# Patient Record
Sex: Male | Born: 1935 | Race: White | Hispanic: No | Smoking: Never smoker
Health system: Southern US, Community
[De-identification: ages and names within clinical notes are randomized; demographics above are authoritative.]

## PROBLEM LIST (undated history)

## (undated) DIAGNOSIS — E669 Obesity, unspecified: Secondary | ICD-10-CM

## (undated) DIAGNOSIS — F32A Depression, unspecified: Secondary | ICD-10-CM

## (undated) DIAGNOSIS — E119 Type 2 diabetes mellitus without complications: Secondary | ICD-10-CM

## (undated) DIAGNOSIS — Z860101 Personal history of adenomatous and serrated colon polyps: Secondary | ICD-10-CM

## (undated) DIAGNOSIS — I2510 Atherosclerotic heart disease of native coronary artery without angina pectoris: Secondary | ICD-10-CM

## (undated) DIAGNOSIS — N179 Acute kidney failure, unspecified: Secondary | ICD-10-CM

## (undated) DIAGNOSIS — N529 Male erectile dysfunction, unspecified: Secondary | ICD-10-CM

## (undated) DIAGNOSIS — M1990 Unspecified osteoarthritis, unspecified site: Secondary | ICD-10-CM

## (undated) DIAGNOSIS — I10 Essential (primary) hypertension: Secondary | ICD-10-CM

## (undated) DIAGNOSIS — E785 Hyperlipidemia, unspecified: Secondary | ICD-10-CM

## (undated) DIAGNOSIS — Z86010 Personal history of colonic polyps: Secondary | ICD-10-CM

## (undated) DIAGNOSIS — I639 Cerebral infarction, unspecified: Secondary | ICD-10-CM

## (undated) DIAGNOSIS — F419 Anxiety disorder, unspecified: Secondary | ICD-10-CM

## (undated) HISTORY — DX: Personal history of colonic polyps: Z86.010

## (undated) HISTORY — PX: LOOP RECORDER IMPLANT: SHX5954

## (undated) HISTORY — DX: Atherosclerotic heart disease of native coronary artery without angina pectoris: I25.10

## (undated) HISTORY — DX: Essential (primary) hypertension: I10

## (undated) HISTORY — DX: Male erectile dysfunction, unspecified: N52.9

## (undated) HISTORY — DX: Acute kidney failure, unspecified: N17.9

## (undated) HISTORY — DX: Type 2 diabetes mellitus without complications: E11.9

## (undated) HISTORY — DX: Anxiety disorder, unspecified: F41.9

## (undated) HISTORY — DX: Personal history of adenomatous and serrated colon polyps: Z86.0101

## (undated) HISTORY — DX: Depression, unspecified: F32.A

## (undated) HISTORY — DX: Obesity, unspecified: E66.9

## (undated) HISTORY — PX: UMBILICAL HERNIA REPAIR: SHX196

## (undated) HISTORY — PX: COLONOSCOPY W/ BIOPSIES AND POLYPECTOMY: SHX1376

## (undated) HISTORY — DX: Hyperlipidemia, unspecified: E78.5

---

## 2000-07-24 ENCOUNTER — Emergency Department (HOSPITAL_COMMUNITY): Admission: EM | Admit: 2000-07-24 | Discharge: 2000-07-24 | Payer: Self-pay | Admitting: Emergency Medicine

## 2000-08-15 ENCOUNTER — Encounter: Admission: RE | Admit: 2000-08-15 | Discharge: 2000-08-15 | Payer: Self-pay | Admitting: Internal Medicine

## 2000-08-15 ENCOUNTER — Encounter: Payer: Self-pay | Admitting: Internal Medicine

## 2000-08-17 ENCOUNTER — Encounter (INDEPENDENT_AMBULATORY_CARE_PROVIDER_SITE_OTHER): Payer: Self-pay | Admitting: *Deleted

## 2000-08-17 ENCOUNTER — Inpatient Hospital Stay (HOSPITAL_COMMUNITY): Admission: AD | Admit: 2000-08-17 | Discharge: 2000-08-21 | Payer: Self-pay | Admitting: *Deleted

## 2000-08-17 ENCOUNTER — Encounter: Payer: Self-pay | Admitting: Nephrology

## 2000-08-18 ENCOUNTER — Encounter: Payer: Self-pay | Admitting: Nephrology

## 2000-09-07 ENCOUNTER — Emergency Department (HOSPITAL_COMMUNITY): Admission: EM | Admit: 2000-09-07 | Discharge: 2000-09-07 | Payer: Self-pay

## 2000-09-26 ENCOUNTER — Ambulatory Visit (HOSPITAL_COMMUNITY): Admission: RE | Admit: 2000-09-26 | Discharge: 2000-09-26 | Payer: Self-pay | Admitting: Nephrology

## 2000-10-18 ENCOUNTER — Encounter: Admission: RE | Admit: 2000-10-18 | Discharge: 2000-10-18 | Payer: Self-pay | Admitting: Nephrology

## 2000-10-18 ENCOUNTER — Encounter: Payer: Self-pay | Admitting: Nephrology

## 2002-08-29 ENCOUNTER — Encounter: Payer: Self-pay | Admitting: Nephrology

## 2002-08-29 ENCOUNTER — Ambulatory Visit (HOSPITAL_COMMUNITY): Admission: RE | Admit: 2002-08-29 | Discharge: 2002-08-29 | Payer: Self-pay | Admitting: Nephrology

## 2002-08-30 ENCOUNTER — Ambulatory Visit (HOSPITAL_COMMUNITY): Admission: RE | Admit: 2002-08-30 | Discharge: 2002-08-30 | Payer: Self-pay | Admitting: Nephrology

## 2002-08-30 ENCOUNTER — Encounter: Payer: Self-pay | Admitting: Nephrology

## 2004-08-27 ENCOUNTER — Encounter: Admission: RE | Admit: 2004-08-27 | Discharge: 2004-08-27 | Payer: Self-pay | Admitting: General Surgery

## 2004-10-25 ENCOUNTER — Ambulatory Visit (HOSPITAL_BASED_OUTPATIENT_CLINIC_OR_DEPARTMENT_OTHER): Admission: RE | Admit: 2004-10-25 | Discharge: 2004-10-25 | Payer: Self-pay | Admitting: General Surgery

## 2004-10-25 ENCOUNTER — Ambulatory Visit (HOSPITAL_COMMUNITY): Admission: RE | Admit: 2004-10-25 | Discharge: 2004-10-25 | Payer: Self-pay | Admitting: General Surgery

## 2006-04-18 DIAGNOSIS — I2510 Atherosclerotic heart disease of native coronary artery without angina pectoris: Secondary | ICD-10-CM

## 2006-04-18 HISTORY — PX: CORONARY ARTERY BYPASS GRAFT: SHX141

## 2006-04-18 HISTORY — DX: Atherosclerotic heart disease of native coronary artery without angina pectoris: I25.10

## 2006-05-07 ENCOUNTER — Inpatient Hospital Stay (HOSPITAL_COMMUNITY): Admission: EM | Admit: 2006-05-07 | Discharge: 2006-05-13 | Payer: Self-pay | Admitting: Emergency Medicine

## 2006-05-07 ENCOUNTER — Ambulatory Visit: Payer: Self-pay | Admitting: *Deleted

## 2006-05-08 ENCOUNTER — Encounter (INDEPENDENT_AMBULATORY_CARE_PROVIDER_SITE_OTHER): Payer: Self-pay | Admitting: *Deleted

## 2006-05-08 ENCOUNTER — Encounter: Payer: Self-pay | Admitting: Vascular Surgery

## 2006-05-30 ENCOUNTER — Ambulatory Visit: Payer: Self-pay | Admitting: Internal Medicine

## 2006-06-06 ENCOUNTER — Encounter: Admission: RE | Admit: 2006-06-06 | Discharge: 2006-06-06 | Payer: Self-pay | Admitting: Surgery

## 2006-06-06 ENCOUNTER — Ambulatory Visit: Payer: Self-pay | Admitting: Surgery

## 2006-06-09 ENCOUNTER — Encounter (HOSPITAL_COMMUNITY): Admission: RE | Admit: 2006-06-09 | Discharge: 2006-09-07 | Payer: Self-pay | Admitting: Internal Medicine

## 2006-07-18 ENCOUNTER — Ambulatory Visit: Payer: Self-pay | Admitting: Surgery

## 2006-09-08 ENCOUNTER — Encounter (HOSPITAL_COMMUNITY): Admission: RE | Admit: 2006-09-08 | Discharge: 2006-09-29 | Payer: Self-pay | Admitting: Internal Medicine

## 2006-10-05 ENCOUNTER — Ambulatory Visit: Payer: Self-pay | Admitting: Internal Medicine

## 2007-03-20 ENCOUNTER — Ambulatory Visit: Payer: Self-pay | Admitting: Internal Medicine

## 2007-09-20 ENCOUNTER — Ambulatory Visit: Payer: Self-pay | Admitting: Internal Medicine

## 2007-09-20 LAB — CONVERTED CEMR LAB
Albumin: 3.9 g/dL (ref 3.5–5.2)
Alkaline Phosphatase: 64 units/L (ref 39–117)
BUN: 23 mg/dL (ref 6–23)
Bilirubin, Direct: 0.2 mg/dL (ref 0.0–0.3)
Calcium: 9.4 mg/dL (ref 8.4–10.5)
Chloride: 106 meq/L (ref 96–112)
Cholesterol: 128 mg/dL (ref 0–200)
Creatinine, Ser: 1.1 mg/dL (ref 0.4–1.5)
Glucose, Bld: 152 mg/dL — ABNORMAL HIGH (ref 70–99)
HDL: 33 mg/dL — ABNORMAL LOW (ref >39.0)
Total Bilirubin: 1.1 mg/dL (ref 0.3–1.2)
Total CHOL/HDL Ratio: 3.9
VLDL: 13 mg/dL (ref 0–40)

## 2007-09-25 ENCOUNTER — Ambulatory Visit: Payer: Self-pay | Admitting: Internal Medicine

## 2008-03-24 ENCOUNTER — Ambulatory Visit: Payer: Self-pay | Admitting: Internal Medicine

## 2008-06-02 ENCOUNTER — Emergency Department (HOSPITAL_COMMUNITY): Admission: EM | Admit: 2008-06-02 | Discharge: 2008-06-02 | Payer: Self-pay | Admitting: Emergency Medicine

## 2008-06-02 ENCOUNTER — Ambulatory Visit: Payer: Self-pay | Admitting: Cardiology

## 2008-06-30 ENCOUNTER — Encounter (INDEPENDENT_AMBULATORY_CARE_PROVIDER_SITE_OTHER): Payer: Self-pay | Admitting: *Deleted

## 2008-07-01 ENCOUNTER — Ambulatory Visit: Payer: Self-pay | Admitting: Internal Medicine

## 2008-07-01 ENCOUNTER — Encounter: Payer: Self-pay | Admitting: Internal Medicine

## 2008-07-01 DIAGNOSIS — I10 Essential (primary) hypertension: Secondary | ICD-10-CM

## 2008-07-01 DIAGNOSIS — R5383 Other fatigue: Secondary | ICD-10-CM

## 2008-07-01 DIAGNOSIS — I2510 Atherosclerotic heart disease of native coronary artery without angina pectoris: Secondary | ICD-10-CM | POA: Insufficient documentation

## 2008-07-01 DIAGNOSIS — R5381 Other malaise: Secondary | ICD-10-CM

## 2008-07-01 DIAGNOSIS — E785 Hyperlipidemia, unspecified: Secondary | ICD-10-CM | POA: Insufficient documentation

## 2008-07-01 DIAGNOSIS — E1169 Type 2 diabetes mellitus with other specified complication: Secondary | ICD-10-CM | POA: Insufficient documentation

## 2008-07-01 DIAGNOSIS — R609 Edema, unspecified: Secondary | ICD-10-CM | POA: Insufficient documentation

## 2008-07-14 ENCOUNTER — Ambulatory Visit: Payer: Self-pay

## 2008-07-14 ENCOUNTER — Encounter: Payer: Self-pay | Admitting: Internal Medicine

## 2008-07-14 ENCOUNTER — Ambulatory Visit: Payer: Self-pay | Admitting: Internal Medicine

## 2008-07-16 LAB — CONVERTED CEMR LAB
Creatinine, Ser: 1.1 mg/dL (ref 0.4–1.5)
Sodium: 141 meq/L (ref 135–145)

## 2008-08-27 ENCOUNTER — Encounter: Payer: Self-pay | Admitting: Internal Medicine

## 2008-09-12 ENCOUNTER — Encounter (INDEPENDENT_AMBULATORY_CARE_PROVIDER_SITE_OTHER): Payer: Self-pay | Admitting: *Deleted

## 2008-10-16 ENCOUNTER — Telehealth: Payer: Self-pay | Admitting: Internal Medicine

## 2008-10-30 ENCOUNTER — Ambulatory Visit: Payer: Self-pay | Admitting: Pulmonary Disease

## 2008-10-30 DIAGNOSIS — G4733 Obstructive sleep apnea (adult) (pediatric): Secondary | ICD-10-CM

## 2008-12-01 ENCOUNTER — Ambulatory Visit: Payer: Self-pay | Admitting: Pulmonary Disease

## 2008-12-01 ENCOUNTER — Ambulatory Visit (HOSPITAL_BASED_OUTPATIENT_CLINIC_OR_DEPARTMENT_OTHER): Admission: RE | Admit: 2008-12-01 | Discharge: 2008-12-01 | Payer: Self-pay | Admitting: Pulmonary Disease

## 2009-01-28 ENCOUNTER — Ambulatory Visit: Payer: Self-pay | Admitting: Internal Medicine

## 2009-02-03 ENCOUNTER — Ambulatory Visit: Payer: Self-pay | Admitting: Internal Medicine

## 2009-02-03 ENCOUNTER — Telehealth: Payer: Self-pay | Admitting: Internal Medicine

## 2009-02-04 LAB — CONVERTED CEMR LAB
BUN: 25 mg/dL — ABNORMAL HIGH (ref 6–23)
Calcium: 9.8 mg/dL (ref 8.4–10.5)
GFR calc non Af Amer: 77.92 mL/min (ref >60)
Glucose, Bld: 179 mg/dL — ABNORMAL HIGH (ref 70–99)
Sodium: 141 meq/L (ref 135–145)

## 2009-03-26 ENCOUNTER — Telehealth: Payer: Self-pay | Admitting: Internal Medicine

## 2009-03-30 ENCOUNTER — Ambulatory Visit: Payer: Self-pay | Admitting: Internal Medicine

## 2009-03-31 LAB — CONVERTED CEMR LAB
CO2: 31 meq/L (ref 19–32)
Chloride: 104 meq/L (ref 96–112)
Creatinine, Ser: 1.1 mg/dL (ref 0.4–1.5)
GFR calc non Af Amer: 69.78 mL/min (ref >60)
Potassium: 5 meq/L (ref 3.5–5.1)
Sodium: 140 meq/L (ref 135–145)

## 2009-09-02 ENCOUNTER — Ambulatory Visit: Payer: Self-pay | Admitting: Internal Medicine

## 2009-09-28 ENCOUNTER — Telehealth: Payer: Self-pay | Admitting: Internal Medicine

## 2009-10-11 ENCOUNTER — Emergency Department (HOSPITAL_COMMUNITY): Admission: EM | Admit: 2009-10-11 | Discharge: 2009-10-11 | Payer: Self-pay | Admitting: Emergency Medicine

## 2009-11-18 ENCOUNTER — Ambulatory Visit: Payer: Self-pay

## 2009-11-18 ENCOUNTER — Ambulatory Visit: Payer: Self-pay | Admitting: Internal Medicine

## 2010-05-20 NOTE — Assessment & Plan Note (Signed)
Summary: ROV PER PT CALL/LG      Allergies Added:   Visit Type:  Follow-up Referring Provider:  Mercede Rollo Primary Provider:  Darral Dash  CC:  no complaints.  History of Present Illness: [name] is 76 year old male with history of coronary artery disease s/p CABG 2008, diabetes and HTN.  He returns today for retained followup.  Returns today for routine follow-up. Here today with his wife.  Says he feels ok. NO CP or SOB. Has pain in R shoulder thinks it is arthitis. Continues to go to exercise progra at [employer]. Having lots of gas. BP well controlled.    Current Medications (verified): 1)  Micardis 80 Mg Tabs (Telmisartan) .Marland Kitchen.. 1 Tab Once Daily 2)  Aspirin Ec 325 Mg Tbec (Aspirin) .... Take One Tablet By Mouth Daily 3)  Furosemide 20 Mg Tabs (Furosemide) .Marland Kitchen.. 1 Tab Two Times A Day 4)  Carvedilol 12.5 Mg Tabs (Carvedilol) .... Take One Tablet By Mouth Twice A Day 5)  Niaspan 500 Mg Cr-Tabs (Niacin (Antihyperlipidemic)) .Marland Kitchen.. 1 Once Daily 6)  Zocor 80 Mg Tabs (Simvastatin) .Marland Kitchen.. 1 Once Daily 7)  Spironolactone 25 Mg Tabs (Spironolactone) .... Take 1/2 Tablet By Mouth Daily  Allergies (verified): 1)  ! * Muscle Relaxors  Past History:  Past Medical History: Last updated: 05/30/2008  1. Coronary artery disease.       a.     Status post NSTEMI in January 2008.       b.     Status post coronary artery bypass grafting in 2008.  LIMA        to LAD, sequential SVG to first, second, and third OMs, sequential        SVG to mid RCA and PDA.   2. Hypertension.   3. Hyperlipidemia.   4. Acute renal failure secondary to nephrotic syndrome post cath,       resolved with steroids.   5. Diabetes  6. Syncopal episode post exercise 2/10, thought secondary to volume depletion  Review of Systems       As per HPI and past medical history; otherwise all systems negative.   Vital Signs:  Patient profile:   76 year old male Height:      69 inches Weight:      214 pounds BMI:      31.72 Pulse rate:   75 / minute BP sitting:   132 / 68  (left arm) Cuff size:   regular  Vitals Entered By: Hardin Negus, RMA (Sep 02, 2009 2:40 PM)  Physical Exam  General:  Gen: well appearing. no resp difficulty HEENT: normal Neck: supple. no JVD. Carotids 2+ bilat; no bruits. No lymphadenopathy or thryomegaly appreciated. Cor: PMI nondisplaced. Regular rate & rhythm. No rubs, gallops, murmur. Lungs: clear Abdomen: obese. soft, nontender, nondistended. No hepatosplenomegaly.  Extremities: no cyanosis, clubbing, rash, 1+ edema  no cords Neuro: alert & orientedx3, cranial nerves grossly intact. moves all 4 extremities w/o difficulty. affect pleasant    Impression & Recommendations:  Problem # 1:  CAD, NATIVE VESSEL (ICD-414.01) Overall doing well. Now 3+ years out from CABG. Will schedule routine ETT to evaluate.  Problem # 2:  HYPERTENSION, BENIGN (ICD-401.1) Blood pressure well controlled. Continue current regimen.  Problem # 3:  HYPERLIPIDEMIA-MIXED (ICD-272.4) Followed by PCP. Goal LDL < 70. GHas tolerate simva 80 well despite black-box warning at this dose. Will consider switching to lipitor 80 when it is generic later this year.  Other Orders: EKG w/ Interpretation (93000) Treadmill (  Treadmill)  Patient Instructions: 1)  Your physician has requested that you have an exercise tolerance test.  For further information please visit [email or web address]  Please also follow instruction sheet, as given. 2)  Follow up in 9 months

## 2010-05-20 NOTE — Progress Notes (Signed)
Summary: GXT/Med questions   Phone Note Call from Patient Call back at Home Phone [phone]   Caller: Spouse Reason for Call: Talk to Nurse Summary of Call: reques to speak to nurse about what meds he should take or not for gxt Initial call taken by: Migdalia Dk,  September 28, 2009 11:03 AM  Follow-up for Phone Call        09/28/09--1130am--LMTCB--nt 09/28/09--1135am--spoke with pt's wife and advised to hold carvedilol and lasix--wife states they will hold all meds and bring them to GXT to take afterward--nt Follow-up by: Ledon Snare, RN,  September 28, 2009 11:38 AM

## 2010-05-27 ENCOUNTER — Ambulatory Visit (INDEPENDENT_AMBULATORY_CARE_PROVIDER_SITE_OTHER): Payer: Medicare Other | Admitting: Internal Medicine

## 2010-05-27 ENCOUNTER — Encounter: Payer: Self-pay | Admitting: Internal Medicine

## 2010-05-27 DIAGNOSIS — I10 Essential (primary) hypertension: Secondary | ICD-10-CM

## 2010-05-27 DIAGNOSIS — I2510 Atherosclerotic heart disease of native coronary artery without angina pectoris: Secondary | ICD-10-CM

## 2010-05-27 DIAGNOSIS — E785 Hyperlipidemia, unspecified: Secondary | ICD-10-CM

## 2010-06-03 NOTE — Assessment & Plan Note (Signed)
Summary: 9 month rov/sl per pt call rs from [facility]  Medications Added ATORVASTATIN CALCIUM 40 MG TABS (ATORVASTATIN CALCIUM) Take 1 tablet by mouth once a day KOMBIGLYZE XR 2.08-998 MG XR24H-TAB (SAXAGLIPTIN-METFORMIN) 1 tablet twice daily      Allergies Added:   Visit Type:  Follow-up Referring Provider:  Bensimhon Primary Provider:  Darral Dash  CC:  no complaints.  History of Present Illness: [name] is 76 year old male with history of coronary artery disease s/p CABG 2008, diabetes and HTN.  He returns today for retained followup.  Returns today for routine follow-up. Says he feels ok. No CP or SOB.  No longer going to exercise program at [facility]. Says he just hasn't done it. Having lots of gas. BP well controlled. Compliant with meds.  Had stress test 8/11. Walked 8:10 no ST-T changes.   Lipids followed by Dr. Wylene Simmer. Recent HgBa1c was 6.4.    Current Medications (verified): 1)  Micardis 80 Mg Tabs (Telmisartan) .Marland Kitchen.. 1 Tab Once Daily 2)  Aspirin Ec 325 Mg Tbec (Aspirin) .... Take One Tablet By Mouth Daily 3)  Furosemide 20 Mg Tabs (Furosemide) .Marland Kitchen.. 1 Tab Two Times A Day 4)  Carvedilol 12.5 Mg Tabs (Carvedilol) .... Take One Tablet By Mouth Twice A Day 5)  Niaspan 500 Mg Cr-Tabs (Niacin (Antihyperlipidemic)) .Marland Kitchen.. 1 Once Daily 6)  Zocor 80 Mg Tabs (Simvastatin) .Marland Kitchen.. 1 Once Daily 7)  Spironolactone 25 Mg Tabs (Spironolactone) .... Take 1/2 Tablet By Mouth Daily 8)  Kombiglyze Xr 2.08-998 Mg Xr24h-Tab (Saxagliptin-Metformin) .Marland Kitchen.. 1 Tablet Twice Daily  Allergies (verified): 1)  ! * Muscle Relaxors  Past History:  Past Medical History: Last updated: 05/30/2008  1. Coronary artery disease.       a.     Status post NSTEMI in January 2008.       b.     Status post coronary artery bypass grafting in 2008.  LIMA        to LAD, sequential SVG to first, second, and third OMs, sequential        SVG to mid RCA and PDA.   2. Hypertension.   3. Hyperlipidemia.   4. Acute renal failure secondary to nephrotic syndrome post cath,       resolved with steroids.   5. Diabetes  6. Syncopal episode post exercise 2/10, thought secondary to volume depletion  Review of Systems       As per HPI and past medical history; otherwise all systems negative.   Vital Signs:  Patient profile:   76 year old male Height:      69 inches Weight:      211.50 pounds BMI:     31.35 Pulse rate:   67 / minute BP sitting:   122 / 64  (left arm) Cuff size:   regular  Vitals Entered By: Micki Riley CNA (May 27, 2010 9:40 AM)  Physical Exam  General:  Well appearing. no resp difficulty HEENT: normal Neck: supple. no JVD. Carotids 2+ bilat; no bruits. No lymphadenopathy or thryomegaly appreciated. Cor: PMI nondisplaced. Regular rate & rhythm. No rubs, gallops, murmur. Lungs: clear Abdomen: obese. soft, nontender, nondistended. No hepatosplenomegaly.  Extremities: no cyanosis, clubbing, rash, 1+ edema  no cords Neuro: alert & orientedx3, cranial nerves grossly intact. moves all 4 extremities w/o difficulty. affect pleasant    Impression & Recommendations:  Problem # 1:  CAD, NATIVE VESSEL (ICD-414.01) Stable. No evidence of ischemia. Continue current regimen. Recent stress test looked good. Urged him  to resume exercise training.  Problem # 2:  HYPERTENSION, BENIGN (ICD-401.1) Blood pressure well controlled. Continue current regimen.  Problem # 3:  HYPERLIPIDEMIA-MIXED (ICD-272.4) Followed by Dr. Wylene Simmer. Goal LDL < 70. Given Black Box warning on simva 80 will change to atorva 40.   Patient Instructions: 1)  Stop Simvastatin 2)  Start Atorvastatin (generic Lipitor) 40mg  daily 3)  Your physician wants you to follow-up in:  9 months.  You will receive a reminder letter in the mail two months in advance. If you don't receive a letter, please call our office to schedule the follow-up appointment. Prescriptions: ATORVASTATIN CALCIUM 40 MG TABS (ATORVASTATIN  CALCIUM) Take 1 tablet by mouth once a day  #30 x 6   Entered by:   Meredith Staggers, RN   Authorized by:   Dolores Patty, MD, [facility]   Signed by:   Meredith Staggers, RN on 05/27/2010   Method used:   Electronically to        [facility] [address] [phone]* (retail)       [address]       [place], [place]  [address]       Ph: [phone] or [phone]       Fax: [phone]   RxID:   [id number]

## 2010-07-04 LAB — URINE CULTURE: Colony Count: NO GROWTH

## 2010-07-04 LAB — DIFFERENTIAL
Basophils Absolute: 0 10*3/uL (ref 0.0–0.1)
Basophils Relative: 0 % (ref 0–1)
Eosinophils Absolute: 0.3 10*3/uL (ref 0.0–0.7)
Eosinophils Relative: 3 % (ref 0–5)
Neutro Abs: 8.3 10*3/uL — ABNORMAL HIGH (ref 1.7–7.7)
Neutrophils Relative %: 73 % (ref 43–77)

## 2010-07-04 LAB — COMPREHENSIVE METABOLIC PANEL
ALT: 22 U/L (ref 0–53)
Alkaline Phosphatase: 48 U/L (ref 39–117)
Calcium: 8.6 mg/dL (ref 8.4–10.5)
Creatinine, Ser: 1.11 mg/dL (ref 0.4–1.5)
Sodium: 137 mEq/L (ref 135–145)
Total Protein: 6.5 g/dL (ref 6.0–8.3)

## 2010-07-04 LAB — URINALYSIS, ROUTINE W REFLEX MICROSCOPIC
Glucose, UA: 250 mg/dL — AB
Ketones, ur: NEGATIVE mg/dL
Specific Gravity, Urine: 1.023 (ref 1.005–1.030)

## 2010-07-04 LAB — CBC
MCV: 93.3 fL (ref 78.0–100.0)
Platelets: 191 10*3/uL (ref 150–400)
RDW: 12.9 % (ref 11.5–15.5)

## 2010-07-04 LAB — URINE MICROSCOPIC-ADD ON

## 2010-08-03 LAB — URINALYSIS, ROUTINE W REFLEX MICROSCOPIC
Bilirubin Urine: NEGATIVE
Glucose, UA: 100 mg/dL — AB
Hgb urine dipstick: NEGATIVE
Nitrite: NEGATIVE
Protein, ur: NEGATIVE mg/dL
Specific Gravity, Urine: 1.014 (ref 1.005–1.030)
pH: 5 (ref 5.0–8.0)

## 2010-08-03 LAB — CK TOTAL AND CKMB (NOT AT ARMC)
CK, MB: 3.3 ng/mL (ref 0.3–4.0)
Total CK: 61 U/L (ref 7–232)

## 2010-08-03 LAB — DIFFERENTIAL
Basophils Absolute: 0 10*3/uL (ref 0.0–0.1)
Basophils Relative: 0 % (ref 0–1)
Eosinophils Absolute: 0.4 10*3/uL (ref 0.0–0.7)
Eosinophils Relative: 4 % (ref 0–5)
Lymphocytes Relative: 13 % (ref 12–46)
Lymphs Abs: 1.2 10*3/uL (ref 0.7–4.0)
Monocytes Absolute: 0.8 10*3/uL (ref 0.1–1.0)

## 2010-08-03 LAB — CBC
HCT: 45.1 % (ref 39.0–52.0)
MCV: 94.7 fL (ref 78.0–100.0)
RBC: 4.76 MIL/uL (ref 4.22–5.81)
WBC: 9.5 10*3/uL (ref 4.0–10.5)

## 2010-08-03 LAB — COMPREHENSIVE METABOLIC PANEL
ALT: 29 U/L (ref 0–53)
AST: 29 U/L (ref 0–37)
Alkaline Phosphatase: 72 U/L (ref 39–117)
BUN: 26 mg/dL — ABNORMAL HIGH (ref 6–23)
Calcium: 9.2 mg/dL (ref 8.4–10.5)
Chloride: 100 mEq/L (ref 96–112)
Creatinine, Ser: 1.41 mg/dL (ref 0.4–1.5)
GFR calc non Af Amer: 50 mL/min — ABNORMAL LOW (ref >60)
Total Protein: 6.7 g/dL (ref 6.0–8.3)

## 2010-08-03 LAB — POCT CARDIAC MARKERS: Myoglobin, poc: 294 ng/mL (ref 12–200)

## 2010-08-31 NOTE — Assessment & Plan Note (Signed)
[facility]                            CARDIOLOGY OFFICE NOTE   AMAR, SIPPEL                     MRN:          [id number]  DATE:09/25/2007                            DOB:          09-12-1934    PRIMARY CARE PHYSICIAN:  Gaspar Garbe, MD   NEPHROLOGIST:  Dyke Maes, MD   INTERVAL HISTORY:  [name] is a delightful 76 year old male with a history  of coronary artery disease, status post non-ST-elevation myocardial  infarction in January 2008 followed by bypass surgery.  He presents  today for routine followup.  Medical history is also notable for  diabetes, hypertension, hyperlipidemia, and obesity.   Overall, he is doing very well.  He is continuing to exercise at [facility].  Denies any chest pain or shortness of breath.  He does note some lower  extremity edema, particularly after he eats salty foods.  He has not had  any orthopnea or PND.  He does have significant fatigue, but denies any  history of obstructive sleep apnea.  He says he does snore a bit, but  does not think it is too bad.   CURRENT MEDICATIONS:  1. Micardis 40 a day.  2. Aspirin 325 a day.  3. Lipitor 80 a day.  4. Fish oil 2 tablets daily.  5. Coreg 12.5 b.i.d.   PHYSICAL EXAMINATION:  He is well appearing in no acute distress,  ambulates around the clinic without any respiratory difficulty.  Blood pressure is 120/62, heart rate is 69, and weight is 213, which is  stable.  HEENT:  Normal.  NECK:  Supple.  There is no JVD.  Carotids are 2+ bilaterally without  any bruits.  There is no lymphadenopathy or thyromegaly.  CARDIAC:  PMI is nondisplaced.  Regular rate and rhythm.  No murmurs or  rubs.  There is a soft S4.  LUNGS:  Clear.  ABDOMEN:  Obese, nontender, and nondistended.  No hepatosplenomegaly.  No bruits.  No masses.  Good bowel sounds.  EXTREMITIES:  Warm with no cyanosis, clubbing, or edema.  No rash.  NEURO:  Alert and oriented x3.  Cranial  nerves II-XII are intact.  Moves  all 4 extremities without difficulty.  Affect is normal.   EKG shows sinus rhythm with sinus arrhythmia.  Previous inferior  infarct.  No acute ST-T wave abnormalities.   ASSESSMENT:  1. Coronary artery disease status post bypass surgery.  He is doing      well without any evidence of ischemia.  Continue current therapy.  2. Hypertension, well controlled.  3. Hyperlipidemia.  Most recent lipid panel showed an LDL of 82 and      HDL of 33.  LDL goal is less than 70.  We will add Niaspan 500 mg a      day to help bring his HDL up and LDL down.  I did warn him about      the side effects.  4. Diabetes, followed by Dr. Wylene Simmer.   DISPOSITION:  Return to clinic in 6 months.     [name]. Bensimhon, MD  Electronically Signed  DRB/MedQ  DD: 09/25/2007  DT: 09/26/2007  Job #: [id number]   cc:   Gaspar Garbe, M.D.

## 2010-08-31 NOTE — Consult Note (Signed)
NAME:  [name] NO.:  [id number]   MEDICAL RECORD NO.:  [id number]          PATIENT TYPE:  EMS   LOCATION:  [facility]                         FACILITY:  [facility]   PHYSICIAN:  Jesse Sans. Wall, MD, FACCDATE OF BIRTH:  1934-06-01   DATE OF CONSULTATION:  06/02/2008  DATE OF DISCHARGE:                                 CONSULTATION   PRIMARY CARDIOLOGIST:  [name]. Bensimhon, MD   PRIMARY CARE PHYSICIAN:  Gaspar Garbe, MD   NEPHROLOGIST:  Dyke Maes, MD   HISTORY OF PRESENT ILLNESS:  This is a 76 year old Caucasian male with  known history of CAD, CABG in 2008, hypertension, and hyperlipidemia who  while at the gym working out felt dizzy and hot.  After sitting down, he  lost consciousness.  The patient states the gym personnel brought him  Gatorade and EMS was called.  The patient noted increased diaphoresis  just prior to passing out.  The patient admits to having an episode like  this in the past during the late fall or summer after getting very hot  working outside.  The patient denies any chest pain with palpitations,  bowel and bladder incontinence, and there is no evidence of seizure  activity.  The patient admits to not being well-hydrated during the day.   REVIEW OF SYSTEMS:  Positive for presyncope, belching x6 months.  Denies  any chest pain, shortness of breath, or nausea and vomiting.  All other  systems are reviewed and found to be negative.   PAST MEDICAL HISTORY:  1. Coronary artery disease.      a.     Status post non-ST elevated MI in January 2008.      b.     Status post coronary artery bypass grafting in 2008.  LIMA       to LAD, sequential SVG to first, second, and third OMs, sequential       SVG to mid RCA and PDA.  2. Hypertension.  3. Hyperlipidemia.  4. Acute renal failure secondary to nephrotic syndrome post cath,      resolved with steroids.   PAST SURGICAL HISTORY:  CABG in 2008 and a hernia repair.   SOCIAL  HISTORY:  He lives in [place] with his wife.  He is a retired  Surveyor, minerals.  He is married.  He does not drink.  He does not smoke and  no illicit drug use.  The patient works out at a gym 3 days a week.   FAMILY HISTORY:  Mother deceased with an MI.  Father deceased with end-  stage renal disease.  He has 3 siblings, 2 have had coronary artery  bypass graft and one with a redo and one has a history of CAD.   CURRENT MEDICATIONS:  1. Niaspan daily.  2. Micardis 40 mg daily.  3. Lasix 20 mg daily.  4. Fish oil daily.  5. Coreg 12.5 mg b.i.d.  6. Zocor 80 mg at bedtime.  7. Norvasc 2.5 mg daily.   ALLERGIES:  No known drug allergies.   CURRENT LABORATORY DATA:  Sodium 135, potassium 4.3,  chloride 100, CO2  27, BUN 26, creatinine 1.4 (creatinine in June 2001 1.1), and glucose  247.  Hemoglobin 16.0, hematocrit 45.1, white blood cells 9.5, platelets  196, troponin 0.01, MB 3.3, and CK 61.  EKG revealing sinus bradycardia  with inferior Q-waves noted, ventricular rate of 66 beats per minute.   PHYSICAL EXAMINATION:  VITAL SIGNS:  Current blood pressure 113/73,  pulse 71, respirations 13, temperature 97.4, and O2 sat 95% on room air.  GENERAL:  He is awake, alert and oriented, in no acute distress.  HEENT:  Head is normocephalic and atraumatic.  Eyes, PERRLA.  Mucous  membranes.  Mouth, pink and moist.  Tongue midline.  NECK:  Supple.  No JVD.  No carotid bruits appreciated.  CARDIOVASCULAR:  Distant heart sounds without murmurs, rubs, or gallops.  LUNGS:  Clear to auscultation without wheezes, rales, or rhonchi.  ABDOMEN:  Obese and nontender.  Bowel sounds 2+ without rebound or  guarding.  EXTREMITIES:  Without clubbing, cyanosis, or edema.  NEURO:  Cranial nerves II through XII were grossly intact.   IMPRESSION:  1. Syncopal episode.  2. Known coronary artery disease, status post 3-vessel coronary artery      bypass grafting in 2008.  3. History of hypertension.  4. History  of nephrotic syndrome.  5. History of hypercholesterolemia.   PLAN:  This is a 76 year old Caucasian male with known history of CAD,  coronary artery bypass grafting, hypertension and hyperlipidemia who had  syncopal episode while working out at [employer] today.  This is probably  secondary to heat and dehydration.  We have recommended that the patient  take the Lasix on a p.r.n. basis only and increase his p.o. fluids.  The  patient will also be taking some over-the-counter simethicone as he is  complaining of burping on the fish oil.  The patient will follow up with  Dr. Gala Romney on July 01, 2008, at 10 a.m. for appointment to be  reevaluated for blood pressure control and the patient symptoms.  In the  interim, the patient has been advised to increase his fluid intake prior  to going to the gym.      Bettey Mare. Lyman Bishop, NP      Jesse Sans. Daleen Squibb, MD, [facility]  Electronically Signed   KML/MEDQ  D:  06/02/2008  T:  06/03/2008  Job:  [id number]   cc:   Gaspar Garbe, M.D.

## 2010-08-31 NOTE — Procedures (Signed)
NAME:  [name], [name] NO.:  [id number]   MEDICAL RECORD NO.:  [id number]          PATIENT TYPE:  OUT   LOCATION:  SLEEP CENTER                 FACILITY:  [facility]   PHYSICIAN:  Coralyn Helling, MD        DATE OF BIRTH:  November 20, 1934   DATE OF STUDY:  12/01/2008                            NOCTURNAL POLYSOMNOGRAM   REFERRING PHYSICIAN:  Coralyn Helling, MD   INDICATION FOR STUDY:  [name] is a 76 year old male with a history  of coronary artery disease, hypertension, and diabetes.  He underwent an  outpatient sleep apnea screening test which showed mild abnormality as a  result, he is referred to the sleep lab for further evaluation of  possibility of obstructive sleep apnea.  Height is 5 feet 9 inches.  Weight is 213 pounds.  BMI is 31.  Neck size 18 inches.   EPWORTH SLEEPINESS SCORE:  4.   MEDICATIONS:  Simvastatin, Micardis, aspirin, furosemide, carvedilol,  Norvasc and Niaspan.   SLEEP ARCHITECTURE:  Total recording time was 425 minutes.  Total sleep  time was 259 minutes.  Sleep efficiency was 61%.  Sleep latency at 14.5  minutes.  REM latency was 66 minutes.  The patient was observed in all  stages of sleep.  The patient slept predominately in the non supine  position.   RESPIRATORY DATA:  The average respiratory rate was 16.  Mild-to-  moderate snoring was noted by the technician.  The overall apnea-  hypopnea index was 2 and the respiratory disturbance index was 14.  The  events were exclusively obstructive in nature.  The REM apnea-hypopnea  index was 8.7.  The non-REM apnea-hypopnea index was 0.5.   OXYGEN DATA:  The baseline oxygenation was 97%.  The oxygen saturation  nadir was 88%.  The patient spent a total of 6 minutes with an oxygen  saturation less than 90%.   CARDIAC DATA:  The average heart rate was 63 and the rhythm strip showed  normal sinus rhythm with occasional PACs.   MOVEMENT-PARASOMNIA:  The periodic limb movement index was 0.  The  patient had 2 restroom trips.   IMPRESSIONS-RECOMMENDATIONS:  This study does show evidence for mild  sleep-disordered breathing with a significant REM effect.   The patient should be counseled with regards to the importance of diet,  exercise and weight reduction.  If these interventions are unsuccessful  given his history of cardiovascular disease and diabetes, additional  therapeutic interventions could include CPAP therapy, oral appliance or  surgical intervention.      Coralyn Helling, MD  Diplomat, American Board of Sleep Medicine  Electronically Signed     VS/MEDQ  D:  12/04/2008 10:24:48  T:  12/04/2008 13:39:02  Job:  [id number]

## 2010-08-31 NOTE — Assessment & Plan Note (Signed)
[name]                            CARDIOLOGY OFFICE NOTE   [name], [name]                     MRN:          [id number]  DATE:03/24/2008                            DOB:          30-Jan-1935    PRIMARY CARE PHYSICIAN:  Mike Garbe, MD   NEPHROLOGIST:  Mike Maes, MD   INTERVAL HISTORY:  [name] is delightful 76 year old male with history of  coronary artery disease status post non-ST-elevation myocardial  infarction, January 2008, followed by bypass surgery who presents today  for routine followup.  His medical history is also notable for diabetes,  hypertension, hyperlipidemia, and obesity.   Overall, he is doing very well.  He continues with exercise with the  [facility].  He denies any chest pain or shortness of breath.  His blood pressure has been running in the 130-140 range.  He notes that  he has a hard time getting his heart rate up over 110 and 120 during  exercise due to his beta-blocker.  Otherwise, he has not had any chest  pain.  No significant dyspnea.  No orthopnea, no PND.   CURRENT MEDICATIONS:  1. Micardis 40 a day.  2. Aspirin 325 a day.  3. Lasix 20 a day.  4. Fish oil.  5. Coreg 12.5 b.i.d.  6. Zocor 80 a day.   PHYSICAL EXAMINATION:  GENERAL:  He is well-appearing in no acute  distress, he ambulates around the clinic without respiratory difficulty.  VITAL SIGNS:  Blood pressure is 136/90, heart rate 66, weight is 216.  HEENT:  Normal.  NECK:  Supple.  No JVD.  Carotids are 2+ bilaterally without any bruits.  There is no lymphadenopathy or thyromegaly.  CARDIAC:  PMI is nondisplaced.  Regular rate and rhythm with S4, no  murmurs.  LUNGS:  Clear.  ABDOMEN:  Obese, nontender, nondistended.  No hepatosplenomegaly, no  bruits, no masses.  Good bowel sounds.  EXTREMITIES:  Warm with no cyanosis, clubbing or edema.  No rash.  NEURO:  Alert and oriented x3.  Cranial nerves II through XII are  intact.  Moves all 4 extremities without difficulty.  Affect is  pleasant.   EKG shows sinus rhythm, previous inferior Q-waves, heart rate 68.   ASSESSMENT AND PLAN:  1. Coronary artery disease.  He is doing well.  No evidence of      ischemia.  EF is normal.  Continue current therapy.  2. Hypertension.  Blood pressure is mildly elevated.  We will add      Norvasc 2.5.  3. Chronic hyperlipidemia.  Most recent cholesterol panel showed an      LDL of 69, HDL of 44.  I will continue Zocor.   DISPOSITION:  We will see him back in 9 months for routine followup.  I  congratulated him on his exercise program.     Mike Buckles. Bensimhon, MD  Electronically Signed    DRB/MedQ  DD: 03/24/2008  DT: 03/25/2008  Job #: [id number]

## 2010-08-31 NOTE — Assessment & Plan Note (Signed)
[facility]                            CARDIOLOGY OFFICE NOTE   ZAKKERY, DORIAN                     MRN:          [id number]  DATE:10/05/2006                            DOB:          [birth year]    PRIMARY CARE PHYSICIAN:  Dr. Guerry Bruin.   NEPHROLOGIST:  Dr. Fidela Salisbury.   INTERVAL HISTORY:  Mr. Lehnen is a delightful 76 year old male with a  history of coronary artery disease, status post previous non-ST  elevation myocardial infarction in January of 2008, followed by bypass  surgery, who presents today for routine followup.  He also has a history  of diabetes, hypertension, hyperlipidemia, and mild obesity consistent  with metabolic syndrome.   He has just completed cardiac rehab, he says he is doing quite well,  denies any exertional chest pain, no shortness of breath.  He has had  some mild lower extremity edema after his Lasix was cut down.  He denies  any orthopnea or PND.   CURRENT MEDICATIONS:  1. Micardis 40 a day.  2. Aspirin 325 a day.  3. Simvastatin 80 a day.  4. Lasix 20 a day.  5. Bystolic 10 mg a day.  6. Fish oil.  7. Lutein.   PHYSICAL EXAMINATION:  He is well appearing.  No acute distress.  Ambulates around the clinic without any respiratory difficulty.  Blood pressure is 122/78, heart rate is 64.  Weight is 208.  HEENT:  Neck is supple, no JVD.  Carotids are 2+ bilaterally without any  bruits.  There is no lymphadenopathy or thyromegaly.  CARDIAC:  He is regular rate and rhythm with an S4, no murmurs or rubs.  PMI is nondisplaced.  LUNGS:  Clear.  ABDOMEN:  Obese, nontender, nondistended.  No hepatosplenomegaly, no  bruits, no masses, good bowel sounds.  A chest wall scar looks good.  EXTREMITIES:  Warm with no cyanosis or clubbing.  There is 1-2+ edema  bilaterally.  NEURO:  Alert and oriented x3.  Cranial nerves II-XII are intact.  Moves  all 4 extremities without difficulty.  Affect is bright.   EKG shows normal sinus rhythm with sinus arrhythmia at a rate of 61.  Evidence of previous inferior infarct.  No acute ST-T wave  abnormalities.   ASSESSMENT:  1. Coronary artery disease, status post bypass surgery.  He is doing      well without any events of ischemia.  I encouraged him to keep up      with his exercise program.  2. Hypertension.  Blood pressure is well controlled; however, I prefer      to have him on generic medications if possible.  We will change his      Bystolic over to Coreg 12.5 b.i.d.  3. Hyperlipidemia.  This is followed by Dr. Wylene Simmer.  Given his      coronary artery disease, need to titrate his statin to get his LDL      down below 70.  4. Lower extremity edema.  We will go ahead and increase his Lasix to      40 mg a day.  He will have a BMET next week with Dr. Wylene Simmer to      check his potassium and follow up on his edema.  He will also      follow up with Dr. Briant Cedar due to his history of previous      nephropathy.     [name]. Bensimhon, MD  Electronically Signed    DRB/MedQ  DD: 10/05/2006  DT: 10/05/2006  Job #: [id number]   cc:   Gaspar Garbe, M.D.  Dyke Maes, M.D.

## 2010-08-31 NOTE — Assessment & Plan Note (Signed)
[facility]                            CARDIOLOGY OFFICE NOTE   DANTA, BAUMGARDNER                     MRN:          [id number]  DATE:03/20/2007                            DOB:          01-25-1935    PRIMARY CARE PHYSICIAN:  Dr. Guerry Bruin.   NEPHROLOGIST:  Dr. Fidela Salisbury.   INTERVAL HISTORY:  [name] is a delightful 76 year old male with a  history of coronary artery disease status post non-ST-elevation  myocardial infarction in January 2008 followed by bypass surgery who  presents today for routine followup.  He also has a history of diabetes,  hypertension, and hyperlipidemia, and obesity.   He is doing quite well.  He denies any chest pain or shortness of  breath.  Unfortunately, he has been very busy with work and has not had  time to continue his exercise program.  He has gained some weight as a  result of this.  He denies any heart failure symptoms.  He has been  compliant with his medications.   CURRENT MEDICATIONS:  1. Micardis 80 mg a day.  2. Aspirin 325 a day.  3. Simvastatin 80.  4. Lasix 20 a day.  5. Fish oil.  6. Lutein.  7. Carvedilol 12.5 b.i.d.   PHYSICAL EXAM:  He is well-appearing.  In no acute distress.  Ambulates  around the clinic without any respiratory difficulty.  Blood pressure 122/84, heart rate is 67, weight 213.  HEENT:  Normal.  NECK:  Supple.  No JVD.  Carotids are 2+ bilaterally without any bruits.  There is no lymphadenopathy or thyromegaly.  CARDIAC:  PMI is nondisplaced.  He has a regular rate and rhythm.  No  murmurs or rubs.  There is a soft S4.  LUNGS:  Clear.  ABDOMEN:  Obese, nontender.  Nondistended.  No hepatosplenomegaly.  No  bruits.  No masses.  Good bowel sounds.  EXTREMITIES:  Warm with no cyanosis, clubbing, or edema.  No rash.  NEURO:  Alert and oriented x3.  Cranial nerves 2-12 are intact.  Moves  all 4 extremities without difficulty.  Affect is normal.    ASSESSMENT:  1. Coronary artery disease status post bypass surgery.  He is doing      well without any evidence of ischemia.  Continue current therapy.  2. Hypertension.  Blood pressure is well controlled.  3. Hyperlipidemia.  This is followed by Dr. Wylene Simmer.  Given his      coronary artery disease, his goal LDL is less than 70.  4. Physical inactivity.  I reminded him of the need to stay more      active and lose some weight.  5. Diabetes.  Followed by Dr. Wylene Simmer.   DISPOSITION:  Return to clinic in 6 months.     [name]. Bensimhon, MD  Electronically Signed    DRB/MedQ  DD: 03/20/2007  DT: 03/20/2007  Job #: [id number]

## 2010-09-03 NOTE — Assessment & Plan Note (Signed)
[facility]                            CARDIOLOGY OFFICE NOTE   [name], [name]                     MRN:          [id number]  DATE:05/30/2006                            DOB:          [birth year]    REFERRING PHYSICIAN:  Gaspar Garbe, M.D.   Nephrologist is Dr. Fidela Salisbury.   PATIENT IDENTIFICATION:  [name] is a delightful 76 year old male  who recently suffered a non-ST elevation myocardial infarction and  underwent bypass surgery, who presents today for a post hospital  followup.   PROBLEM LIST:  1. Coronary artery disease.      a.     Status post non-ST elevation myocardial infarction in       January 2008.      b.     Cardiac catheterization showed severe 3-vessel coronary       artery disease with preserved ejection fraction.      c.     Status post bypass surgery by Dr. Laneta Simmers.  2. Diabetes, new diagnosis.  3. Hypertension.  4. Hyperlipidemia.  5. Mild obesity.   CURRENT MEDICATIONS:  1. Aspirin 81 a day.  2. Metoprolol 50 b.i.d.  3. Simvastatin 40 a day.  4. Micardis hydrochlorothiazide 20/12.5 one half tablet a day.   INTERVAL HISTORY:  [name] returns today for post bypass followup.  He is doing quite well.  Obviously, he feels a bit fatigued, but is able  to go up and down the steps without any significant difficulty.  He has  not had any further chest pain.  No real dyspnea.  No lower extremity  edema.  He has been compliant with all of his medications.   PHYSICAL EXAMINATION:  He is well appearing, in no acute distress.  Ambulates around the clinically slowly without any respiratory  difficulty.  Blood pressure 118/66.  Pulse is 76.  Weight is 207.  HEENT:  Sclerae anicteric.  EOMI.  There is no xanthelasma.  Mucous  membranes are moist.  NECK:  Supple.  No JVD.  Carotids are 2+ bilaterally without any bruits.  There is no lymphadenopathy or thyromegaly.  CARDIAC:  Regular rate and rhythm plus S4.   No murmurs, rubs or gallops.  LUNGS:  Clear.  ABDOMEN:  Obese.  Non-tender.  Non-distended.  No hepatosplenomegaly.  No bruits.  No masses.  Chest wall scar looks clean and stable.  There is no erythema.  No  discharge.  EXTREMITIES:  Warm with no cyanosis, clubbing or edema.  NEUROLOGIC:  Alert and oriented x3.  Cranial nerves 2 through 12 are  intact.  Moves all 4 extremities without difficulty.  Affect is bright.  EKG shows normal sinus rhythm at a rate of 76.  There are inferior Q  waves consistent with previous myocardial infarction.  No acute ST-T  wave changes.   ASSESSMENT AND PLAN:  1. Coronary artery disease.  He is doing well status post bypass      surgery.  He is on a good regimen, and we will continue this.  2. Hypertension.  Well controlled.  Continue current regimen.  3. Hyperlipidemia.  Continue statin.  We will follow up on lipids at      his next visit.   DISPOSITION:  I have urged him to go to cardiac rehab.  We will see him  in 6 months for routine followup.  I have cleared him to go on a trip to  [facility] in March.     [name]. Bensimhon, MD  Electronically Signed    DRB/MedQ  DD: 05/30/2006  DT: 05/30/2006  Job #: [id number]   cc:   Gaspar Garbe, M.D.  Dyke Maes, M.D.

## 2010-09-03 NOTE — Assessment & Plan Note (Signed)
[facility]                                 ON-CALL NOTE   [name], [name]                       MRN:          [id number]  DATE:06/23/2006                            DOB:          1934-11-27    PRIMARY CARDIOLOGIST:  Dr. Nicholes Mango.   [name] called this evening because he had developed left shoulder  pain.  He stated the pain was on the point of his left shoulder, and did  not remind him of his anginal symptoms from the time he came in the  hospital with a heart attack.  He stated that he had started the leaf  blower yesterday, and had been at cardiac rehab doing upper body work  today.  He stated he had taken some Ultracet, which are a combination of  325 mg of Tylenol and 37.5 mg of tramadol, and had not gotten much  relief.   I discussed this situation with [name].  I stated that if he was  having a problem with any of his grafts closing, the pain would be  severe, and he should call 911.  He stated that he did not feel that  that was the case.  I advised him that heat or ice could be used for  symptomatic relief, and if he had any other pain medications leftover  from his bypass surgery, he could try those as well.  He was advised to  re contact us if his symptoms got any worse, or if he felt that it was a  cardiac issue; otherwise, he was encouraged to use the medications  available to him for symptomatic relief.      Theodore Demark, PA-C  Electronically Signed      Gerrit Friends. Dietrich Pates, MD, [facility]  Electronically Signed   RB/MedQ  DD: 06/23/2006  DT: 06/24/2006  Job #: [id number]

## 2010-09-03 NOTE — Op Note (Signed)
[name], [name]              ACCOUNT NO.:  [id number]   MEDICAL RECORD NO.:  [id number]          PATIENT TYPE:  INP   LOCATION:  2302                         FACILITY:  [facility]   PHYSICIAN:  Evelene Croon, M.D.     DATE OF BIRTH:  1935-01-22   DATE OF PROCEDURE:  05/09/2006  DATE OF DISCHARGE:                               OPERATIVE REPORT   PREOPERATIVE DIAGNOSIS:  Left main and severe three vessel coronary  disease status post non-ST segment elevation myocardial infarction.   POSTOPERATIVE DIAGNOSIS:  Left main and severe three vessel coronary  disease status post non-ST segment elevation myocardial infarction.   OPERATIVE PROCEDURE:  Median sternotomy, extracorporeal circulation,  coronary bypass graft surgery x6 using a left internal mammary artery  graft to the left anterior descending coronary with a sequential  saphenous vein graft to the first, second, and third obtuse marginal  branches of the left circumflex coronary, and a sequential saphenous  vein graft to the mid right coronary artery and the posterior descending  branch of the right coronary, endoscopic vein harvesting from the right  leg.   ATTENDING SURGEON:  Evelene Croon, M.D.   ASSISTANT:  Coral Ceo, P.A.-C.   ANESTHESIA:  General endotracheal.   CLINICAL HISTORY:  This patient is a 76 year old gentleman with a  history of hypertension, hyperlipidemia, and a strong family history of  heart disease, who had a history of chest pain and shoulder pain for  about five years.  He had an episode about five years ago that resolved  after he presented to [facility] Emergency Room, but he did  not stay around for further workup.  He has continued to have  intermittent episodes of chest pain and on May 06, 2006, developed 9  out of 10 chest pain and shoulder pain while washing his car.  His CPK-  MB was 12.4 with a troponin of 0.08 which increased to 0.31.  Electrocardiogram showed inferior Q-waves  with no acute change.  He was  treated with heparin and Integrilin.  Yesterday morning, he had a 2-3  hour episode of chest pain resolved with intravenous nitroglycerin.  He  underwent cardiac catheterization which showed a 40% diffuse left main  stenosis.  There was severe three vessel coronary disease with ejection  fraction about 60%.  He had about to 90% ostial LAD stenosis and 80%  proximal LAD stenosis.  The left circumflex had a 99% trifurcation  stenosis at the trifurcation of the three marginal branches.  There is  also about 90% stenosis in the proximal portion of the third marginal  branch.  The right coronary had 90% ostial stenosis and was occluded  before the posterior descending branch which filled by collaterals from  the left.  After review of the angiogram and examination of the patient,  it was felt that coronary bypass graft surgery was the best treatment to  prevent further ischemia and infarction.  I discussed the operative  procedure with the patient, his wife, and his son including  alternatives, benefits, and risks including but not limited to bleeding,  blood transfusion,  infection, stroke, myocardial infarction, graft  failure, and death.  They understood and agreed to proceed.   OPERATIVE PROCEDURE:  The patient was taken to the operating room and  placed on the table in the supine position.  After induction of general  endotracheal anesthesia, a Foley catheter was placed in the bladder  using sterile technique.  Then, the chest, abdomen and both lower  extremities were prepped and draped in the usual sterile manner.  The  chest was entered through a median sternotomy incision and the  pericardium opened in the midline.  Examination of the heart showed good  ventricular contractility.  The ascending aorta had no palpable plaques  in it.   Then, the left internal mammary artery was harvested from the chest wall  as a pedicle graft.  This is a medium caliber  vessel with excellent  blood flow through it.  At the same time, a segment of greater saphenous  vein was harvested from the right leg using endoscopic vein harvest  technique.  This vein was of medium size and good quality.   Then, the patient was heparinized and when an adequate activated  clotting time was achieved, the distal ascending aorta was cannulated  using a 20-French aortic cannula for arterial in flow.  Venous outflow  was achieved using a two stage venous cannula through the right atrial  appendage.  An antegrade cardioplegia and vent cannula was inserted in  the aortic root.  The patient was placed on cardiopulmonary bypass and  distal coronaries identified.  The LAD was a large graftable vessel and  was heavily diseased in its proximal and mid portions.  The diagonal  branches were all tiny vessels.  The left circumflex gave off three  moderate sized marginal branches, all which were graftable.  The right  coronary was diffusely diseased with calcific plaque.  There was an area  in the mid portion that was soft enough to graft and this appeared to  supply a couple of acute marginal branches that supplied the right  ventricle.  The posterior descending was a large graftable vessel.   Then, the aorta was crossclamped and 1000 mL of cold blood antegrade  cardioplegia was administered in the aortic root with quick arrest of  the heart.  Systemic hypothermia to 28 degrees centigrade and topical  hypothermia with iced saline was used.  A temperature probe was placed  in the septum and insulating pad in the pericardium.   The first distal anastomosis was then performed to the posterior  descending coronary.  The internal diameter of this vessel was about  1.75 mm.  The conduit used was a segment of greater saphenous vein and  the anastomosis performed in a sequential end-to-side manner continuous 7-0 Prolene suture.  Flow was measured through the graft and was  excellent.    The second distal anastomosis was performed to the mid portion of the  right coronary.  The internal diameter here was about 2 mm.  The conduit  used was the same segment of greater saphenous vein and the anastomosis  formed in a sequential side-to-side manner using continuous 7-0 Prolene  suture.  Flow was noted through the graft and was excellent.  Then,  another dose of cardioplegia was given down the vein graft and the  aortic root.   The third distal anastomosis was performed to the first marginal branch.  The internal diameter was about 1.6 mm.  The conduit used was a second  segment  of greater saphenous vein.  The anastomosis was performed in a  sequential side-to-side manner using continuous 7-0 Prolene suture.  Flow was measured through the graft and was excellent.   The fourth distal anastomosis was performed to the second marginal  branch.  The internal diameter was 1.6 mm.  The conduit used was the  same segment of greater saphenous vein and the anastomosis was performed  in a sequential side-to-side manner using continuous 7-0 Prolene suture.  Flow was measured through the graft and was excellent.   The fifth distal anastomosis was performed to the third marginal branch.  The internal diameter was about 1.6 mm.  The conduit used was the same  segment of greater saphenous vein.  The anastomosis was performed in a  sequential end-to-side manner using continuous 7-0 Prolene suture.  Flow  was again measured through the graft and was excellent.  Then, another  dose of cardioplegia was given down the vein grafts and aortic root.   The sixth distal anastomosis was performed to the distal portion of the  LAD.  The internal os of this vessel was about 2 mm.  The conduit used  was the left internal mammary graft and this was brought through an  opening in the left pericardium anterior to the phrenic nerve.  It was  anastomosed to the LAD in an end-to-side manner using continuous  8-0  Prolene suture.  The pedicle was sutured to the epicardium with 6-0  Prolene sutures.  The patient was then rewarmed to 37 degrees  centigrade.  The two proximal vein graft anastomoses were performed to  the aortic root in an end-to-side manner using continuous 6-0 Prolene  suture.  Then, the clamp was removed from the mammary pedicle.  There  was rapid warming of the ventricular septum and return of spontaneous  ventricular fibrillation.  The crossclamp was removed with a time 105  minutes.  There is spontaneous return of sinus rhythm.  The proximal and  distal anastomoses appeared hemostatic while the life of grafts  satisfactory.  Graft markers were placed around the proximal  anastomoses.  Two temporary right ventricular and right atrial pacing  wires were placed and brought through the skin.   When the patient had been rewarmed to 37 degrees centigrade, he was weaned from cardiopulmonary bypass on low dose dopamine.  Total bypass  time was 121 minutes.  Cardiac function appeared excellent with a  cardiac output of 4.5 liters per minute.  Protamine was given and venous  and aortic cannulae were removed without difficulty.  Hemostasis was  achieved.  Three chest tubes were placed, two in the posterior  pericardium, one in the left pleural space, and one in the anterior  mediastinum.  The pericardium was reapproximated over the heart.  The  sternum was closed with #6 stainless steel wires.  The fascia was closed  with continuous #1 Vicryl suture.  The subcutaneous tissue was closed  with continuous 2-0 Vicryl and the skin with 3-0 Vicryl subcuticular  closure.  The lower extremity vein harvest site was closed in layers in  a similar manner.  The sponge, needle and instrument counts were correct  according to the scrub nurse.  Dry sterile dressings were applied over  the incisions and around the chest tubes which were hooked to Pleur-Evac  suction.  The patient remained  hemodynamically stable and was  transferred to the SICU in guarded but stable condition.  May 08, 2006  Evelene Croon, M.D.  Electronically Signed     BB/MEDQ  D:  05/09/2006  T:  05/09/2006  Job:  [id number]   cc:   [name]. Bensimhon, MD

## 2010-09-03 NOTE — Cardiovascular Report (Signed)
NAME:  YER, OLIVENCIA NO.:  [id number]   MEDICAL RECORD NO.:  [id number]          PATIENT TYPE:  INP   LOCATION:  3312                         FACILITY:  [facility]   PHYSICIAN:  [name]. Bensimhon, MDDATE OF BIRTH:  27-Dec-1934   DATE OF PROCEDURE:  05/08/2006  DATE OF DISCHARGE:                            CARDIAC CATHETERIZATION   PRIMARY CARE PHYSICIAN:  Gaspar Garbe, M.D.   NEPHROLOGIST:  Dyke Maes, M.D.   Mr. Feutz is a 76 year old male with history of hypertension and  hyperlipidemia.  He presented to the emergency room with a several-month  history of progressive exertional shoulder pain.  He ruled in for  subendocardial myocardial infarction.  EKG showed inferior Q waves of  unclear duration.  He is brought to the cardiac catheterization lab to  evaluate his coronary anatomy.   PROCEDURES PERFORMED:  1. Selective coronary angiography.  2. Left heart catheterization.  3. Left ventriculogram.  4. Left subclavian angiography.   DESCRIPTION OF PROCEDURE:  Risks and benefits of catheterization were  explained.  Consent was signed and placed on the chart.  A 6-French  arterial sheath was placed in the right femoral artery using a modified  Seldinger technique.  Standard catheters including a JL-4, JR-4 and  angled pigtail were used for the procedure. All catheter exchanges made  over wire. There were no apparent complications.   Central aortic pressure 106/62, mean of 81.  LV pressure 108/11,  EDP of  18. No aortic stenosis.   Left main had a distal 40% stenosis.   LAD had an ostial 90% stenosis followed by a long tubular 80% stenosis.  There were two small diagonals that came off proximally, and there was a  40% lesion in the mid section of the LAD.   Left circumflex was a large system.  It gave off an insignificant ramus  and an insignificant OM-1. There was a large OM-2 and OM-3.  In the mid  portion of the AV groove circumflex,  there was a 99% lesion creating a  trifurcation lesion with the OM-2 in OM-3 ostia.   The right coronary artery was a dominant vessel.  It was diffusely  diseased.  It had a 90% proximal lesion followed by a 40% mid lesion and  was totally occluded in the distal segment before the PDA.  There were  faint left-to-right collaterals.   The left ventriculogram showed an EF of 60% with mild inferior basilar  hypokinesis.   ASSESSMENT:  1. Severe three-vessel coronary artery disease as described above.  2. Normal ejection fraction.   PLAN:  He will need to be evaluated by CVTS for possible bypass surgery.      [name]. Bensimhon, MD  Electronically Signed     DRB/MEDQ  D:  05/08/2006  T:  05/08/2006  Job:  [id number]   cc:   Gaspar Garbe, M.D.  Dyke Maes, M.D.

## 2010-09-03 NOTE — Discharge Summary (Signed)
NAMESOLAN, [name]              ACCOUNT NO.:  [id number]   MEDICAL RECORD NO.:  [id number]          PATIENT TYPE:  INP   LOCATION:  2004                         FACILITY:  [facility]   PHYSICIAN:  Evelene Croon, M.D.     DATE OF BIRTH:  1935/01/04   DATE OF ADMISSION:  05/07/2006  DATE OF DISCHARGE:  05/13/2006                               DISCHARGE SUMMARY   ADMISSION DIAGNOSIS:  Coronary artery disease.   DISCHARGE DIAGNOSES:  1. Coronary artery disease, status post coronary artery bypass graft.  2. Hypertension.  3. Hyperlipidemia.  4. Borderline diabetes mellitus.  5. History of acute renal failure secondary to nephrotic syndrome in      2003.  6. Postoperative volume overload.  7. Postoperative acute blood loss anemia.  8. Non-ST segment elevation myocardial infarction.   PROCEDURES:  On May 08, 2006, the patient underwent cardiac  catheterization by Dr. Gala Romney.  On May 09, 2006, the patient  underwent coronary artery bypass graft x6 with LIMA to the LAD,  saphenous vein graft to the OM, OM2, OM3, saphenous vein graft to the  mid RCA and PDA with endoscopic vein harvest right leg by Dr. Evelene Croon.   CONSULT:  Dr. Laneta Simmers was consulted May 08, 2006, for cardiothoracic  surgery.   HISTORY AND PHYSICAL:  A 76 year old male with history of hypertension  and hyperlipidemia presenting with left shoulder pain.  The patient's  pain started today while washing the car.  The patient complains of pain  9/10, left shoulder pain, with no aggravation noted.  This has been  happening for about a year now.  The patient went for a walk in the park  and experienced chest pain that resolved with rest.  Another episode  while standing in the airport recently.  The pain did improve with  nitroglycerin.   HOSPITAL COURSE:  The patient was admitted to [facility] on  May 07, 2006.  He was started on IV nitroglycerin and sat was  increased.  The patient was set  up for a catheterization on May 08, 2006.  He underwent cardiac catheterization by Dr. Gala Romney on May 08, 2006, which showed severe 3-vessel coronary artery disease with  normal ejection fracture.  Dr. Laneta Simmers saw the patient on May 08, 2006, and he was set up for a CABG on May 09, 2006.  The patient had  pre-CABG Dopplers which were within normal limits.   The patient underwent CABG x6 without any complications.  He was  extubated on May 09, 2006.  Postoperatively the patient's course was  uneventful and he progressed as expected.  Postoperatively the patient  maintained normal sinus rhythm.  He did have some vasodilation up until  postoperative day #3.  Postoperative day #4 his blood pressure was  stable at 98/55.  The patient had some acute blood loss anemia  postoperatively, however, he did not require any blood transfusion and  this increased appropriately.  He had postoperative volume overload and  was diuresed with Lasix and responded appropriately.   The patient has a history of borderline diabetes  and this was well  controlled with diet.  He was maintained on a sliding scale insulin  postoperatively.  The patient's incisions were clear, dry, and intact.  He was ambulating with cardiac rehab with a steady gait.  He was doing  his incentive spirometry appropriately.   DISCHARGE DISPOSITION:  He was discharged home on May 13, 2006, in  stable condition.   MEDICATIONS:  1. Lopressor 25 mg p.o. b.i.d.  2. Zocor 5 mg p.o. daily.  3. Micardis 20 mg p.o. daily.  4. Lasix 10 mg p.o. t.i.d.  5. Potassium chloride 20 mEq p.o. daily t.i.d.  6. Zocor 40 mg p.o. daily.  7. Oxycodone 5 mg one to two tablets every 4 hours p.r.n.  8. Aspirin 325 mg p.o. daily.   INSTRUCTIONS:  The patient instructed to follow a low-fat, low-salt  diet.  No driving or lifting anything greater than 10 pounds for 3  weeks.  The patient in the shower to clean his incision with  mild soap  and water.  Return for problems such as incision erythema, drainage,  temperature greater than 101.5.   FOLLOWUP:  The patient will follow with Dr. Laneta Simmers in three weeks.  Office will contact him with time and date of appointment.  He is to  call Dr. Prescott Gum office for an appointment in two weeks.      Constance Holster, [place]      Evelene Croon, M.D.  Electronically Signed    JMW/MEDQ  D:  06/20/2006  T:  06/20/2006  Job:  [id number]   cc:   Evelene Croon, M.D.  [name]. Bensimhon, MD

## 2010-09-03 NOTE — Procedures (Signed)
[place]. [facility]  Patient:    [name], [name]                     MRN: [id number] Proc. Date: 08/18/00 Adm. Date:  [id number] Attending:  Verlin Grills                           Procedure Report  PROCEDURE:  Left percutaneous renal biopsy.  REASON:  Acute renal failure and nephrotic syndrome.  DESCRIPTION OF PROCEDURE:  The risks of the procedure were explained to the patient and informed consent obtained.  Specifically, a 1 in 10 chance of gross hematuria and a 1 in 100 chance of requiring blood transfusion, 1 in 1000 chance of requiring surgical intervention to stop bleeding, and a 1 in 5000 chance of nephrectomy because of renal biopsy.  The patient understood the risks and was willing to proceed.  The patient was placed in the prone position, and the left flank was prepped and draped in sterile fashion.  Local anesthesia was obtained with 10 cc of 2% Xylocaine.  Under direct ultrasound guidance, a 15 gauge ASAP biopsy needle was placed in the inferior pole of the left kidney on four separate occasions.  We had return of two cores of fatty tissue and two cores of renal tissue.  The patient tolerated the procedure well. DD:  08/18/00 TD:  08/21/00 Job: [id number]

## 2010-09-03 NOTE — Discharge Summary (Signed)
[place]. [facility]  Patient:    [name], [name]                     MRN: [id number] Adm. Date:  [id number] Disc. Date: [id number] Attending:  Armanda Heritage Dictator:   Wolfgang Phoenix, P.A. CC:         [facility]   Discharge Summary  DISCHARGE DIAGNOSES: 1. Acute renal failure secondary to nephrotic syndrome. 2. Hypertension.  HISTORY OF PRESENT ILLNESS:  [name] is a 76 year old white male with a several-week history of peripheral edema, nausea, and abdominal tightness.  [name] was previously healthy except for some history of hypertension.  Labs performed in the [facility] office showed positive protein on a urinalysis.  Serum creatinine in November 2001 was 0.9 which then increased later to 2.3 and then in May 2002 increased to 7.6.  [name] reportedly took some NSAIDs approximately two weeks ago and was previously on Hyzaar.  These were discontinued on the date prior to admission.  [name] had a CT of the abdomen earlier in the week prior to admission which showed no abnormality.  PHYSICAL EXAMINATION:  VITAL SIGNS:  Blood pressure was 176/98 with pulse of 102 and temp of 97.4.  CHEST:  Lungs were clear.  CARDIOVASCULAR:  Heart was regular.  ABDOMEN:  Positive bowel sounds, nontender, nondistended, and no hepatosplenomegaly.  There was edema noted in the abdominal wall.  EXTREMITIES:  Revealed 2-3+ edema.  NEUROLOGIC:  No asterixis and no focal deficits.  ASSESSMENT AND PLAN:  [name] was admitted for IV diuresis, serologic evaluation, renal ultrasound, and possible renal biopsy.  HOSPITAL COURSE: #1 - ACUTE RENAL FAILURE SECONDARY TO NEPHROTIC:  Initially, the patient was started on IV Lasix, and a renal ultrasound was performed which showed normal-sized kidneys with a tiny right upper pole renal cyst.  Serum protein electrophoresis was performed which showed a total protein of 5.0, serum albumin of 35.4, alpha-1  globulin of 3.5, alpha-2 globulin of 22.4, beta globulin of 18.5, and a gamma globulin of 20.2.  A 24-hour urine collection revealed 6850 mg of protein.  The patient responded well to diuretics with an average output of approximately 2000 cc per day.  [name] underwent a renal biopsy on Aug 18, 2000, performed by Dr. Primitivo Gauze.  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 The patient did experience some hematuria following renal biopsy and had a hemoglobin drop from 16.1 to 14.4.  Hematuria cleared within 24 hours, and his hemoglobin remained stable.  With diuresis, his creatinine decreased from 3.1 to 2.9.  BUN on the date of discharge was 50.  In addition, the patients weight was down 8.5 pounds from admission.  On Aug 21, 2000, the patient was found in stable condition and was switched to p.o. Lasix 160 mg p.o. b.i.d. with arrangements made for close outpatient follow-up with Dr. Charlann Boxer.  #2 - HYPERTENSION:  On admission, [name] was on Norvasc 5 mg p.o. q.d. With diuresis, his blood pressure was under better control.  His systolic blood pressure was in the 140s to 150s at discharge.  [name] will continue on Norvasc as an outpatient.  DISCHARGE MEDICATIONS: 1. Norvasc 5 mg one p.o. q.d. 2. Lasix 160 mg p.o. b.i.d. 3. Phenergan 25 mg q.6-8h. p.r.n. nausea.  DISCHARGE INSTRUCTIONS:  [name] is to follow a 2-gram sodium diet.  DISCHARGE FOLLOWUP:  [name] is to follow up on Friday, May 10, at 11:15 at [place] with Dr. Charlann Boxer.  DISCHARGE CONDITION:  Stable.  DISPOSITION:  Discharged to home. DD:  10/10/00 TD:  10/10/00 Job: [id number]

## 2010-09-03 NOTE — Consult Note (Signed)
NAMEMarland Kitchen  Mike, Fernandez              ACCOUNT NO.:  [id number]   MEDICAL RECORD NO.:  [id number]          PATIENT TYPE:  INP   LOCATION:  3312                         FACILITY:  [facility]   PHYSICIAN:  Evelene Croon, M.D.     DATE OF BIRTH:  [birth year]   DATE OF CONSULTATION:  05/08/2006  DATE OF DISCHARGE:                                 CONSULTATION   REASON FOR CONSULTATION:  Left main with severe 3 vessel coronary artery  disease status post non-ST segment elevation myocardial infarction.   CLINICAL HISTORY:  This patient is a 76 year old white male with a  history of hypertension, hyperlipidemia, a strong family history of  heart disease who gives about a 5 year history of intermittent  substernal chest pain and shoulder pain.  He said that he had an episode  of pain about 5 years ago and was seen at [facility], but was  told it was musculoskeletal and the pain gradually resolved.  This  happened while exerting himself and in retrospect it feels that he  probably had a myocardial infarction, says this is the same type of pain  that he had on presentation now.  He said he continued to have this pain  intermittently since that point in time.  These symptoms seem to be  occurring more frequently and with more severity.  He was admitted on  May 06, 2006, after developing a 9/10 chest pain and left shoulder  pain while washing his car.  He said that he had similar pain within the  past few weeks while he was in an airport in [place], after he carried his  luggage off a stopped escalator.  When he presented this time, his CPK-  MB was 12.4 with a troponin of 0.08.  Electrocardiogram showed inferior  Q-waves with no acute changes.  His CPK increased to 18 with a troponin  of 0.31.  His initial pain on presentation improved with Nitroglycerin.  He was started on heparin and integralin and remained stable.  This  morning he had about 2 to 3 hours of chest pain and shoulder pain,  which  improved with Nitroglycerin intravenously.  He underwent cardiac  catheterization today, which showed about 40% distal left main stenosis.  The LAD had a 90% ostial stenosis and 80% proximal stenosis.  This was a  large vessel.  The left circumflex had a 99% trifurcation lesion where 3  marginales came off the mid left circumflex.  There was also a 90%  stenosis in one of these marginal branches.  The right coronary artery  had 90% ostial and proximal stenosis and was occluded before the  posterior descending branch.  The posterior descending filled by  collaterals from the left.  Left ventricular ejection fraction was about  60% with mild inferior hypokinesis.  There was no gradient across the  aortic valve and no mitral regurgitation.   REVIEW OF SYSTEMS:  GENERAL:  He denies any fever or chills.  He has had  no recent weight changes.  He has had some fatigue.  EYES:  Negative.  ENT:  Negative.  ENDOCRINE:  He denies diabetes and hypothyroidism.  CARDIOVASCULAR:  As above.  He denies orthopnea and PND.  He has had  some exertional dyspnea associated with the pain.  He denies peripheral  edema.  He has had no complications.  RESPIRATORY:  Denies cough and sputum production.  GI: He denies nausea and vomiting.  He has had no melena or bright red  blood per rectum.  GU:  He denies dysuria and hematuria.  PSYCHIATRIC:  Negative.  MUSCULOSKELETAL:  He denies arthralgias and myalgias.   ALLERGIES:  NONE.   PAST MEDICAL HISTORY:  Significant for hypertension and hyperlipidemia.  He had a history of acute renal failure secondary to nephrotic syndrome,  which was felt to be related to nonsteroidal anti-inflammatory agents.  He has been followed by Dr. Primitivo Gauze for that.  He has had no  prior surgery, except some oral surgery on his teeth.   MEDICATIONS:  1. Zocor 40 mg daily.  2. Micardis 40 mg daily.  3. Lasix 40 mg daily.   SOCIAL HISTORY:  He lives in [place]  with his wife.  He is retired  from the [employer] development [employer].  He has not smoked and denies alcohol  abuse.   FAMILY HISTORY:  Strongly positive for cardiac disease.  His mother died  in her 19s of a massive myocardial infarction and 3 sisters have had  extensive coronary disease.  His father had end-stage renal disease.   PHYSICAL EXAMINATION:  VITAL SIGNS:  Blood pressure 115/70, his pulse is  70 and regular.  Respiratory rate is 16, unlabored.  GENERAL:  He is a well-developed white male in no distress.  HEENT:  Shows to be normocephalic and atraumatic.  Pupils are equal and  reactive to light and accommodation.  Extraocular muscles are intact.  The throat is clear.  NECK:  Normal carotid pulses bilaterally.  There are no bruits.  There  is no adenopathy or thyromegaly.  CARDIAC:  Shows regular rate and rhythm with normal S1 and S2.  There is  no murmur, rub, or gallop.  LUNGS:  Clear.  ABDOMEN:  Shows active bowel sounds.  His abdomen is soft and nontender.  There are no palpable masses, organomegaly.  EXTREMITIES:  Showed no peripheral edema.  Pedal pulses are palpable  bilaterally.  SKIN:  Warm and dry.  NEUROLOGIC EXAM:  Shows him to be alert and oriented x3.  Motor and  sensory exams are normal.   IMPRESSION:  [name] has a left main severe 3 vessel coronary  artery disease presenting with non-ST segment elevation myocardial  infarction.  He did have chest pain this morning prior to his  catheterization, but has been stable since on Integrilin and  Nitroglycerin.  I agree that coronary artery bypass graft surgery is the  best treatment for no further ischemia and infarction.  I discussed the  operative procedure with the patient and his wife and son.  We discussed  alternatives, benefits, and risks including but not limited to bleeding,  blood transfusion, infection, stroke, myocardial infarction, graft failure, and death.  We will continue him on heparin, Integrilin,  and  Nitroglycerin and plan to do surgery tomorrow.  I asked him to let the  nurses know if he develops any chest pain or shoulder pain, in which  case we will probably proceed surgery emergently.      Evelene Croon, M.D.  Electronically Signed     BB/MEDQ  D:  05/08/2006  T:  05/09/2006  Job:  [id number]   cc:   Manya Silvas. Bensimhon, MD

## 2010-09-03 NOTE — H&P (Signed)
NAME:  [name], [name] NO.:  [id number]   MEDICAL RECORD NO.:  [id number]          PATIENT TYPE:  INP   LOCATION:  3312                         FACILITY:  [facility]   PHYSICIAN:  Audery Amel, MD    DATE OF BIRTH:  08-May-1934   DATE OF ADMISSION:  05/06/2006  DATE OF DISCHARGE:                              HISTORY & PHYSICAL   CHIEF COMPLAINT:  Shoulder pain.   HISTORY OF PRESENT ILLNESS:  The patient is a 76 year old white male  with a past medical history notable for hypertension and hyperlipidemia,  who presents to the emergency department for evaluation of shoulder  pain.  The patient stated that the pain started earlier today, while  washing his car.  He characterized the pain as 9 out of 10, severe pain  localized to the left shoulder without radiation or associated symptoms.  Of note, when asked if there have been previous episodes, there is quite  an extensive history.  The patient states that he first noticed pain in  his neck with exertion approximately 1 year prior to this presentation  while walking in the park in the wintertime.  Recently, he experienced  similar symptoms while ascending a long flight of stairs in the airport,  requiring him to stop and rest.  He states that his symptoms are almost  always exertional and to his recollection do not occur with rest.  He  has never sought medical advice before this evening.  His reasoning for  presenting tonight is that the pain persisted at rest.  On arrival to  the emergency department, the patient was treated with morphine and  nitroglycerin, which did improve his symptoms somewhat.  EKG, on  arrival, revealed inferior Q waves consistent with a remote infarct when  compared to previous EKGs.  There is, however, new ST depression on the  lateral leads, which is concerning for a nonST-elevation MI.  His  initial cardiac biomarkers revealed a troponin of 0.08 and CK-MB of  12.4.  Otherwise, he is without  complaint.   PAST MEDICAL HISTORY:  1. Hypertension.  2. Hyperlipidemia.  3. History of acute renal failure, secondary to nephrotic syndrome,      with resolved with steroids.   ALLERGIES:  NO KNOWN DRUG ALLERGIES.   HOME MEDICATIONS:  1. Zocor 40 mg daily.  2. Micardis 40 mg daily.  3. Lasix 40 mg daily.   SOCIAL HISTORY:  The patient lives in [place] with his wife.  He is a  retired Surveyor, minerals, but still teaches classes occasionally.  He denies  any tobacco, alcohol, or illicit substances.  He exercises 3 days per  week.   FAMILY HISTORY:  His mother died of a myocardial infarction in her 23s.  His father died of endstage renal disease.  He has 3 siblings all with  extensive CAD, 2 of which have had open heart surgery and one of which  had a redo CABG.   REVIEW OF SYSTEMS:  CONSTITUTION:  No fevers, chills, sweats or  adenopathy.  HEENT:  No headaches, sore throat, nasal discharge, change  of vision  or auditory acuity.  SKIN:  No rashes or lesions.  CARDIOPULMONARY:  Per HPI.  GU:  No frequency urgency, dysuria,  straining.  NEURO/PSYCH:  No weakness, numbness, or mood disturbance.  MUSCULOSKELETAL:  The patient does have palpable pain over the left  shoulder.  GI:  No nausea, vomiting, diarrhea, or bright red blood per  rectum, melena, dysphagia or GERD.  ENDOCRINE:  No polyuria, no  polydipsia, no heat or cold intolerance.  All other review of systems  are negative.   PHYSICAL EXAMINATION:  VITAL SIGNS:  Blood pressure 143/73, pulse is 67,  O2 sats are 92% on room air.  GENERAL:  The patient is alert and oriented x3, no acute distress,  pleasantly conversant.  HEENT:  Normocephalic, atraumatic.  EOMI, PERRL, nares patent, O and P  clear without erythema or exudate.  NECK:  Supple.  Full range of motion.  No JVD.  Carotid upstrokes are  equal and symmetric bilaterally without audible bruit.  There is no  palpable thyromegaly, lymphadenopathy.  CARDIOVASCULAR:   Reveals normal S1, S2 without audible murmurs, rubs, or  gallops.  His PMI is slightly displaced to the left of midclavicular  line.  LUNGS:  Clear to auscultation bilaterally with normal respiratory  effort.  SKIN:  No rashes or lesions.  ABDOMEN:  Obese, soft, nontender, nondistended.  Positive bowel sounds.  No hepatosplenomegaly.  GU:  Normal male genitalia.  EXTREMITIES:  No clubbing, cyanosis, or edema.  Peripheral pulses are 2+  and symmetric bilaterally and there is no audible femoral bruit.  NEUROLOGIC:  Sensation and strength are grossly intact throughout.  Otherwise, exam is not focal.   Chest x-ray:  No acute cardiopulmonary process.  EKG:  Normal sinus  rhythm an age-indeterminate inferior MI, unchanged compared with  previous studies.  There is new ST depression in the lateral leads.   LABS:  Sodium 135, potassium 3.9, chloride 102, CO2 is 32, BUN is 29,  creatinine 1.2, glucose 200.  CK-MB is 12.4, troponin 0.08.   IMPRESSION:  1. Acute coronary syndrome.  2. Hypertension.  3. Hyperlipidemia.  4. History of acute renal failure with nephrotic syndrome.   PLAN:  I suspect, based on his history and presentation, that his  shoulder pain represents an angina equivalent and that he has been  experiencing stable angina for a number of years.  On presentation  tonight, the EKG reveals ST depression in the lateral leads, consistent  with an acute coronary syndrome.  His cardiac biomarkers are  indeterminate, and we will cycle these x2 additional sets.  We will  continue with unfractionated heparin drip for anticoagulation.  The  patient was treated with aspirin in the emergency department, then we  will start and eptifibatide drip.  Will continue the patient's home dose  of Zocor 40 mg daily and check a fasting lipid profile in the a.m.  In  addition, will continue with Micardis 40 mg daily for BP control.  I have added metoprolol 25 mg p.o. every 6 hours to his regimen  with a  goal heart rate of 60.  Can consider checking a transthoracic  echocardiogram to minimize contrast load, given his history of acute  renal failure.      Audery Amel, MD  Electronically Signed     SHG/MEDQ  D:  05/07/2006  T:  05/07/2006  Job:  [id number]

## 2010-09-03 NOTE — Op Note (Signed)
NAME:  CZAR, YSAGUIRRE NO.:  [id number]   MEDICAL RECORD NO.:  [id number]          PATIENT TYPE:  AMB   LOCATION:  DSC                          FACILITY:  [facility]   PHYSICIAN:  Gita Kudo, M.D. DATE OF BIRTH:  [birth year]   DATE OF PROCEDURE:  10/25/2004  DATE OF DISCHARGE:                                 OPERATIVE REPORT   OPERATIVE PROCEDURE:  Repair umbilical hernia with Ventralex  two sided  mesh.   SURGEON:  Gita Kudo, M.D.   ANESTHESIA:  General endotracheal.   PREOPERATIVE DIAGNOSIS:  Umbilical hernia.   POSTOPERATIVE DIAGNOSIS:  Umbilical hernia.   CLINICAL SUMMARY:  76 year old male with symptomatic umbilical hernia that  has enlarged and now comes in for elective repair.   OPERATIVE FINDINGS:  The patient had a medium sized umbilical hernia with  some incarcerated preperitoneal fat. The abdominal cavity was not entered.   OPERATIVE PROCEDURE:  Under satisfactory general anesthesia, the patient was  prepped, draped and positioned in the standard fashion. He received 1.0  grams Ancef preop and I infiltrated 30 cc of 0.5% Marcaine with epinephrine  during the procedure for postop analgesia. Transverse incision made above  the umbilicus and the hernia sac identified and dissected from the skin,  subcu and fascia. Then the self-retaining retractor was placed giving  excellent exposure. Cautery was used to circumscribe the fascia - umbilical  hernia junction. The hernia was then reduced and finger dissection used to  develop the preperitoneal space for placement of the mesh. Then the median  circle of Ventralex mesh was placed with a tab side up. It was anchored in  four quadrants to the abdominal wall by going through the abdominal wall  through the upper layer of mesh back up through the abdominal wall and  tying. The mesh was well deployed in the preperitoneal space. Then the  hernia defect was closed by approximating the  fascia with  three #0 Prolene figure-of-eight sutures taking bites of the  mesh underneath. The wound was then lavaged with saline, closed in layers  with 2-0 and 3-0 Vicryl followed by Steri-Strips for skin. He tolerated the  procedure well and went to the recovery room in good condition.       MRL/MEDQ  D:  10/25/2004  T:  10/25/2004  Job:  [id number]   cc:   Dyke Maes, M.D.  [address]  [place]  [place] [address]  Fax: [phone]

## 2010-09-29 ENCOUNTER — Telehealth: Payer: Self-pay | Admitting: Internal Medicine

## 2010-09-29 NOTE — Telephone Encounter (Signed)
Pt is in doughnut hole with insurance, and has to pay out of pocket for his meds, wife asking about generic for mycardis, lipitor, and niaspan pls call [phone] jean

## 2010-09-29 NOTE — Telephone Encounter (Signed)
Spoke w/pt's wife she states pt has gone into donut hole and they can not afford his meds:  Atorvastatin 40 mg $103/month, Micardis 80 mg $118/month and Niaspan 500 mg $80/month.  His last cholesterol check was in Jan LDL 77, HDL 33 that was on simva 80 and he was changed to atorvastatin 40 in Feb at appt w/Dr Bensimhon.  Will discuss with Dr Gala Romney if any med changes can be made and call her back tom.

## 2010-09-30 NOTE — Telephone Encounter (Signed)
Stop Niaspan. Fish oil 2g per day Change micardis to losartan 100 qd Change atorva to simva 40

## 2010-09-30 NOTE — Telephone Encounter (Signed)
Left message to call back  

## 2010-10-01 ENCOUNTER — Telehealth: Payer: Self-pay | Admitting: Internal Medicine

## 2010-10-01 MED ORDER — LOSARTAN POTASSIUM 100 MG PO TABS: 100.0000 mg | ORAL_TABLET | Freq: Every day | ORAL | Status: DC

## 2010-10-01 MED ORDER — OMEGA-3 FATTY ACIDS 1000 MG PO CAPS: 2.0000 g | ORAL_CAPSULE | Freq: Every day | ORAL | Status: AC

## 2010-10-01 NOTE — Telephone Encounter (Signed)
See phone note dated 6/13

## 2010-10-01 NOTE — Telephone Encounter (Signed)
Pt's wife rtn call from [facility] yesterday

## 2010-10-01 NOTE — Telephone Encounter (Signed)
Spoke w/pt's wife she is aware of all med changes and will do so, she states they enough Lipitor for a couple of months that she purchased out of pocket she will call me back when runs low

## 2010-12-21 ENCOUNTER — Other Ambulatory Visit: Payer: Self-pay | Admitting: Internal Medicine

## 2011-01-13 ENCOUNTER — Telehealth: Payer: Self-pay | Admitting: Internal Medicine

## 2011-01-13 NOTE — Telephone Encounter (Signed)
Pt wife calling needing  atorvastin 40 mg Spirionolactone 25 mg  Called in to [facility] for 90 day supply.

## 2011-01-14 ENCOUNTER — Other Ambulatory Visit: Payer: Self-pay

## 2011-01-14 MED ORDER — SPIRONOLACTONE 25 MG PO TABS: ORAL_TABLET | ORAL | Status: DC

## 2011-01-14 MED ORDER — ATORVASTATIN CALCIUM 20 MG PO TABS: ORAL_TABLET | ORAL | Status: DC

## 2011-02-21 ENCOUNTER — Telehealth: Payer: Self-pay | Admitting: Internal Medicine

## 2011-02-21 NOTE — Telephone Encounter (Signed)
According to phone message 09/29/10, pt is to switch to Simvastatin 40mg  but I don't see a lipid panel on him for over a year (?2010).  Should he have a lipid ordered?  Anything else?

## 2011-02-21 NOTE — Telephone Encounter (Signed)
New message-pt is taking generic for lipitor and he thought he was taking 40 mg but since he has switched to [facility] it is 20 mg please verify dosage

## 2011-02-22 NOTE — Telephone Encounter (Signed)
I'm pretty sure that his lipids are followed by PCP at [facility]

## 2011-03-23 ENCOUNTER — Telehealth: Payer: Self-pay | Admitting: Internal Medicine

## 2011-03-23 NOTE — Telephone Encounter (Signed)
No lipid panel in chart.  Pt needs to check with pcp per Dr Gala Romney.  (see note from 11/12).  Pt notified.

## 2011-03-23 NOTE — Telephone Encounter (Signed)
Needs atorvastatin 40 mg not 20 mg as shown on med list, has always been on 40 mg but when it was changed to generic the mg was changed, uses [facility] mail order , pls call wife [phone] when called in

## 2011-04-27 DIAGNOSIS — I10 Essential (primary) hypertension: Secondary | ICD-10-CM | POA: Diagnosis not present

## 2011-04-27 DIAGNOSIS — Z125 Encounter for screening for malignant neoplasm of prostate: Secondary | ICD-10-CM | POA: Diagnosis not present

## 2011-04-27 DIAGNOSIS — E119 Type 2 diabetes mellitus without complications: Secondary | ICD-10-CM | POA: Diagnosis not present

## 2011-04-27 DIAGNOSIS — I2510 Atherosclerotic heart disease of native coronary artery without angina pectoris: Secondary | ICD-10-CM | POA: Diagnosis not present

## 2011-05-04 DIAGNOSIS — Z125 Encounter for screening for malignant neoplasm of prostate: Secondary | ICD-10-CM | POA: Diagnosis not present

## 2011-05-04 DIAGNOSIS — I2510 Atherosclerotic heart disease of native coronary artery without angina pectoris: Secondary | ICD-10-CM | POA: Diagnosis not present

## 2011-05-04 DIAGNOSIS — R809 Proteinuria, unspecified: Secondary | ICD-10-CM | POA: Diagnosis not present

## 2011-05-04 DIAGNOSIS — Z0000 Encounter for general adult medical examination without abnormal findings: Secondary | ICD-10-CM | POA: Diagnosis not present

## 2011-05-04 DIAGNOSIS — Z1212 Encounter for screening for malignant neoplasm of rectum: Secondary | ICD-10-CM | POA: Diagnosis not present

## 2011-05-04 DIAGNOSIS — E1129 Type 2 diabetes mellitus with other diabetic kidney complication: Secondary | ICD-10-CM | POA: Diagnosis not present

## 2011-08-18 ENCOUNTER — Encounter: Payer: Self-pay | Admitting: *Deleted

## 2011-08-23 ENCOUNTER — Encounter: Payer: Self-pay | Admitting: Cardiovascular Disease

## 2011-08-23 ENCOUNTER — Ambulatory Visit (INDEPENDENT_AMBULATORY_CARE_PROVIDER_SITE_OTHER): Payer: Medicare Other | Admitting: Cardiovascular Disease

## 2011-08-23 VITALS — BP 110/68 | HR 74 | Ht 68.5 in | Wt 202.4 lb

## 2011-08-23 DIAGNOSIS — I2510 Atherosclerotic heart disease of native coronary artery without angina pectoris: Secondary | ICD-10-CM

## 2011-08-23 NOTE — Progress Notes (Signed)
HPI:  This is a 77 year old gentleman presenting for cardiac followup. He has coronary artery disease and underwent coronary bypass surgery in 2008 after presenting with unstable angina. He had severe multivessel disease and underwent LIMA to LAD grafting, sequential saphenous vein graft 2 first, second, and third OM's, and sequential saphenous vein grafting to the mid right coronary artery and right PDA. He's had no recurrent ischemic events and his last stress test was in 2011 when he walked 8 minutes with no significant ST or T wave changes. His lipids and hemoglobin A1c are followed by Dr. Wylene Simmer.  The patient denies chest pain or pressure with activity. He denies dyspnea, orthopnea, PND, palpitations, or edema. He does complain of generalized fatigue. He's gained some weight over the last several months but in comparison to his last office visit here one year ago his weight is down 9 pounds.  Outpatient Encounter Prescriptions as of 08/23/2011  Medication Sig Dispense Refill  . aspirin 325 MG tablet Take 325 mg by mouth daily.      Marland Kitchen atorvastatin (LIPITOR) 40 MG tablet Take 40 mg by mouth daily.      . carvedilol (COREG) 12.5 MG tablet Take 12.5 mg by mouth 2 (two) times daily with a meal.      . fish oil-omega-3 fatty acids 1000 MG capsule Take 2 capsules (2 g total) by mouth daily.      . furosemide (LASIX) 20 MG tablet TAKE 1 TABLET BY MOUTH TWICE A DAY  60 tablet  11  . Grape Seed 50 MG CAPS Take 50 mg by mouth daily.      Marland Kitchen losartan (COZAAR) 100 MG tablet Take 1 tablet (100 mg total) by mouth daily.  90 tablet  3  . montelukast (SINGULAIR) 10 MG tablet Take 10 mg by mouth at bedtime.      . Saxagliptin-Metformin (KOMBIGLYZE XR) 2.08-998 MG TB24 Take 1,000 mg by mouth daily.       Marland Kitchen spironolactone (ALDACTONE) 25 MG tablet Take 1/2 tab daily  90 tablet  3  . vitamin E 100 UNIT capsule Take 100 Units by mouth daily.      Marland Kitchen DISCONTD: atorvastatin (LIPITOR) 20 MG tablet Take 1 tab daily  90  tablet  3  . DISCONTD: niacin (NIASPAN) 1000 MG CR tablet Take 1,000 mg by mouth at bedtime.      Marland Kitchen DISCONTD: telmisartan (MICARDIS) 80 MG tablet Take 80 mg by mouth daily.        No Known Allergies  Past Medical History  Diagnosis Date  . CAD (coronary artery disease) January 2008    LIMA to LAD, sequential SVG to first, second, and third OM's, sequential SVG to mid RCA and PDA   . HTN (hypertension)   . Hyperlipidemia   . Acute renal failure     secondary to nephrotic syndrome post cath, resolved with steroids    ROS: Negative except as per HPI  BP 110/68  Pulse 74  Ht 5' 8.5" (1.74 m)  Wt 91.808 kg (202 lb 6.4 oz)  BMI 30.33 kg/m2  PHYSICAL EXAM: Pt is alert and oriented, obese male in NAD HEENT: normal Neck: JVP - normal, carotids 2+= without bruits Lungs: CTA bilaterally CV: RRR without murmur or gallop Abd: soft, NT, Positive BS, no hepatomegaly Ext: Trace bilateral pretibial edema, distal pulses intact and equal Skin: warm/dry no rash  EKG:  Sinus rhythm with occasional PVCs and PACs, age indeterminate inferior infarct, heart rate 74 beats per  minute.  ASSESSMENT AND PLAN: 1. CAD status post CABG. The patient is stable without angina. He will continue on his current medical program. He is on antiplatelet therapy with aspirin, lipid-lowering with atorvastatin, and is appropriately on the beta blocker and ACE inhibitor in the setting of his diabetes.  2. Hypertension. Blood pressure is very well-controlled on his current medical program.  3. Dyslipidemia. Followed by primary care physician. The patient is on atorvastatin 40 mg. Next  4. Obesity. A long discussion today with the patient about the impact of his weight on his other medical problems. We discussed the importance of initiating and exercise regimen. He also discussed strategies at dietary changes. He will consider the [employer].  Tonny Bollman 08/23/2011 9:56 AM

## 2011-08-23 NOTE — Patient Instructions (Signed)
Your physician wants you to follow-up in:  12 months.  You will receive a reminder letter in the mail two months in advance. If you don't receive a letter, please call our office to schedule the follow-up appointment.   

## 2011-10-04 DIAGNOSIS — H2589 Other age-related cataract: Secondary | ICD-10-CM | POA: Diagnosis not present

## 2011-10-04 DIAGNOSIS — H52229 Regular astigmatism, unspecified eye: Secondary | ICD-10-CM | POA: Diagnosis not present

## 2011-10-04 DIAGNOSIS — Z961 Presence of intraocular lens: Secondary | ICD-10-CM | POA: Diagnosis not present

## 2011-10-04 DIAGNOSIS — H5210 Myopia, unspecified eye: Secondary | ICD-10-CM | POA: Diagnosis not present

## 2011-11-08 DIAGNOSIS — I10 Essential (primary) hypertension: Secondary | ICD-10-CM | POA: Diagnosis not present

## 2011-11-08 DIAGNOSIS — E1129 Type 2 diabetes mellitus with other diabetic kidney complication: Secondary | ICD-10-CM | POA: Diagnosis not present

## 2011-11-08 DIAGNOSIS — I2510 Atherosclerotic heart disease of native coronary artery without angina pectoris: Secondary | ICD-10-CM | POA: Diagnosis not present

## 2011-11-08 DIAGNOSIS — R809 Proteinuria, unspecified: Secondary | ICD-10-CM | POA: Diagnosis not present

## 2012-02-22 DIAGNOSIS — H2510 Age-related nuclear cataract, unspecified eye: Secondary | ICD-10-CM | POA: Diagnosis not present

## 2012-02-29 DIAGNOSIS — Z23 Encounter for immunization: Secondary | ICD-10-CM | POA: Diagnosis not present

## 2012-02-29 DIAGNOSIS — I10 Essential (primary) hypertension: Secondary | ICD-10-CM | POA: Diagnosis not present

## 2012-05-03 DIAGNOSIS — E1129 Type 2 diabetes mellitus with other diabetic kidney complication: Secondary | ICD-10-CM | POA: Diagnosis not present

## 2012-05-03 DIAGNOSIS — I2510 Atherosclerotic heart disease of native coronary artery without angina pectoris: Secondary | ICD-10-CM | POA: Diagnosis not present

## 2012-05-03 DIAGNOSIS — E782 Mixed hyperlipidemia: Secondary | ICD-10-CM | POA: Diagnosis not present

## 2012-05-03 DIAGNOSIS — I10 Essential (primary) hypertension: Secondary | ICD-10-CM | POA: Diagnosis not present

## 2012-05-03 DIAGNOSIS — Z125 Encounter for screening for malignant neoplasm of prostate: Secondary | ICD-10-CM | POA: Diagnosis not present

## 2012-05-04 DIAGNOSIS — E1129 Type 2 diabetes mellitus with other diabetic kidney complication: Secondary | ICD-10-CM | POA: Diagnosis not present

## 2012-05-10 DIAGNOSIS — Z125 Encounter for screening for malignant neoplasm of prostate: Secondary | ICD-10-CM | POA: Diagnosis not present

## 2012-05-10 DIAGNOSIS — H16109 Unspecified superficial keratitis, unspecified eye: Secondary | ICD-10-CM | POA: Diagnosis not present

## 2012-05-10 DIAGNOSIS — Z1331 Encounter for screening for depression: Secondary | ICD-10-CM | POA: Diagnosis not present

## 2012-05-15 DIAGNOSIS — Z1212 Encounter for screening for malignant neoplasm of rectum: Secondary | ICD-10-CM | POA: Diagnosis not present

## 2012-09-05 ENCOUNTER — Encounter: Payer: Self-pay | Admitting: Cardiovascular Disease

## 2012-09-05 ENCOUNTER — Ambulatory Visit (INDEPENDENT_AMBULATORY_CARE_PROVIDER_SITE_OTHER): Payer: Medicare Other | Admitting: Cardiovascular Disease

## 2012-09-05 VITALS — BP 118/70 | HR 62 | Ht 69.0 in | Wt 205.0 lb

## 2012-09-05 DIAGNOSIS — I2510 Atherosclerotic heart disease of native coronary artery without angina pectoris: Secondary | ICD-10-CM | POA: Diagnosis not present

## 2012-09-05 DIAGNOSIS — H25019 Cortical age-related cataract, unspecified eye: Secondary | ICD-10-CM | POA: Diagnosis not present

## 2012-09-05 DIAGNOSIS — H2589 Other age-related cataract: Secondary | ICD-10-CM | POA: Diagnosis not present

## 2012-09-05 NOTE — Progress Notes (Signed)
HPI:  This is a 78 year old gentleman presenting for cardiac followup. He has coronary artery disease and underwent coronary bypass surgery in 2008 after presenting with unstable angina. He had severe multivessel disease and underwent LIMA to LAD grafting, sequential saphenous vein graft 2 first, second, and third OM's, and sequential saphenous vein grafting to the mid right coronary artery and right PDA. He's had no recurrent ischemic events and his last stress test was in 2011 when he walked 8 minutes with no significant ST or T wave changes. His lipids and hemoglobin A1c are followed by Dr. Wylene Simmer.  The patient had an episode of right shoulder pain recently. He's not had this symptom in the past, but her reminded him a little of the left shoulder pain he had back at the time of his anginal symptoms. He denies dyspnea, edema, or palpitations. He's had no substernal chest pain or pressure. He denies lightheadedness or syncope. He does not exercise. He has been unable to lose any weight.   Outpatient Encounter Prescriptions as of 09/05/2012  Medication Sig Dispense Refill  . aspirin 325 MG tablet Take 325 mg by mouth daily.      Marland Kitchen atorvastatin (LIPITOR) 40 MG tablet Take 40 mg by mouth daily.      . carvedilol (COREG) 12.5 MG tablet Take 12.5 mg by mouth 2 (two) times daily with a meal.      . furosemide (LASIX) 20 MG tablet TAKE 1 TABLET BY MOUTH TWICE A DAY  60 tablet  11  . losartan (COZAAR) 100 MG tablet Take 1 tablet (100 mg total) by mouth daily.  90 tablet  3  . montelukast (SINGULAIR) 10 MG tablet Take 10 mg by mouth at bedtime.      . Saxagliptin-Metformin (KOMBIGLYZE XR) 2.08-998 MG TB24 Take 1,000 mg by mouth daily.       Marland Kitchen spironolactone (ALDACTONE) 25 MG tablet Take 1/2 tab daily  90 tablet  3  . vitamin E 100 UNIT capsule Take 100 Units by mouth daily.      . [DISCONTINUED] Grape Seed 50 MG CAPS Take 50 mg by mouth daily.       No facility-administered encounter medications on  file as of 09/05/2012.    No Known Allergies  Past Medical History  Diagnosis Date  . CAD (coronary artery disease) January 2008    LIMA to LAD, sequential SVG to first, second, and third OM's, sequential SVG to mid RCA and PDA   . HTN (hypertension)   . Hyperlipidemia   . Acute renal failure     secondary to nephrotic syndrome post cath, resolved with steroids   ROS: Negative except as per HPI  BP 118/70  Pulse 62  Ht 5\' 9"  (1.753 m)  Wt 92.987 kg (205 lb)  BMI 30.26 kg/m2  PHYSICAL EXAM: Pt is alert and oriented, pleasant obese male in NAD HEENT: normal Neck: JVP - normal, carotids 2+= without bruits Lungs: CTA bilaterally CV: RRR without murmur or gallop Abd: soft, NT, Positive BS, obese Ext: no C/C/E, distal pulses intact and equal Skin: warm/dry no rash  EKG:  Sinus rhythm 62 beats per minute, minimal voltage criteria for LVH, age indeterminate inferior infarct. No significant changes from previous tracing last year.2  ASSESSMENT AND PLAN: 1. CAD status post CABG. The patient is 6 years out from bypass surgery. His right shoulder pain was atypical for coronary ischemia. Recommend continued observation and medical therapy. If he has recurrence of symptoms, or certainly if  he has symptoms related to exertion, he will notify us. I would pursue stress testing if that were to occur. He should continue his current medical program.  2. Hypertension. Blood pressure is well controlled on carvedilol, Aldactone, and losartan.  3. Hyperlipidemia. The patient's lipids are followed by Dr. Wylene Simmer. He's on a high intensity statin.  4. Obesity. We had a lengthy discussion about the importance of weight loss. We specifically reviewed dietary changes with a focus on starch reduction. He understands the importance of at least 5 days of exercise every week.  For follow-up I'll see him back in one year.  Tonny Bollman 09/05/2012 1:47 PM

## 2012-09-05 NOTE — Patient Instructions (Addendum)
Your physician wants you to follow-up in: 1 YEAR with Dr Cooper.  You will receive a reminder letter in the mail two months in advance. If you don't receive a letter, please call our office to schedule the follow-up appointment.  Your physician recommends that you continue on your current medications as directed. Please refer to the Current Medication list given to you today.  

## 2012-09-14 DIAGNOSIS — R109 Unspecified abdominal pain: Secondary | ICD-10-CM | POA: Diagnosis not present

## 2012-09-14 DIAGNOSIS — E1165 Type 2 diabetes mellitus with hyperglycemia: Secondary | ICD-10-CM | POA: Diagnosis not present

## 2012-09-14 DIAGNOSIS — E1129 Type 2 diabetes mellitus with other diabetic kidney complication: Secondary | ICD-10-CM | POA: Diagnosis not present

## 2012-09-14 DIAGNOSIS — I2510 Atherosclerotic heart disease of native coronary artery without angina pectoris: Secondary | ICD-10-CM | POA: Diagnosis not present

## 2012-09-14 DIAGNOSIS — I10 Essential (primary) hypertension: Secondary | ICD-10-CM | POA: Diagnosis not present

## 2012-09-14 DIAGNOSIS — N200 Calculus of kidney: Secondary | ICD-10-CM | POA: Diagnosis not present

## 2012-09-17 DIAGNOSIS — N400 Enlarged prostate without lower urinary tract symptoms: Secondary | ICD-10-CM | POA: Diagnosis not present

## 2012-09-17 DIAGNOSIS — I7090 Unspecified atherosclerosis: Secondary | ICD-10-CM | POA: Diagnosis not present

## 2012-10-04 ENCOUNTER — Other Ambulatory Visit (HOSPITAL_COMMUNITY): Payer: Self-pay | Admitting: Internal Medicine

## 2012-10-04 ENCOUNTER — Encounter: Payer: Self-pay | Admitting: Internal Medicine

## 2012-10-04 DIAGNOSIS — G5710 Meralgia paresthetica, unspecified lower limb: Secondary | ICD-10-CM | POA: Diagnosis not present

## 2012-10-04 DIAGNOSIS — E669 Obesity, unspecified: Secondary | ICD-10-CM | POA: Diagnosis not present

## 2012-10-04 DIAGNOSIS — R109 Unspecified abdominal pain: Secondary | ICD-10-CM

## 2012-10-04 DIAGNOSIS — Z6830 Body mass index (BMI) 30.0-30.9, adult: Secondary | ICD-10-CM | POA: Diagnosis not present

## 2012-10-04 DIAGNOSIS — E1165 Type 2 diabetes mellitus with hyperglycemia: Secondary | ICD-10-CM

## 2012-10-04 DIAGNOSIS — I10 Essential (primary) hypertension: Secondary | ICD-10-CM | POA: Diagnosis not present

## 2012-10-04 DIAGNOSIS — E1129 Type 2 diabetes mellitus with other diabetic kidney complication: Secondary | ICD-10-CM | POA: Diagnosis not present

## 2012-10-04 DIAGNOSIS — R5381 Other malaise: Secondary | ICD-10-CM | POA: Diagnosis not present

## 2012-10-04 DIAGNOSIS — E782 Mixed hyperlipidemia: Secondary | ICD-10-CM | POA: Diagnosis not present

## 2012-10-11 ENCOUNTER — Encounter (HOSPITAL_COMMUNITY)
Admission: RE | Admit: 2012-10-11 | Discharge: 2012-10-11 | Disposition: A | Discharge status: Home / Self Care | Payer: Medicare Other | Source: Ambulatory Visit | Attending: Internal Medicine | Admitting: Internal Medicine

## 2012-10-11 DIAGNOSIS — R109 Unspecified abdominal pain: Secondary | ICD-10-CM | POA: Diagnosis not present

## 2012-10-11 DIAGNOSIS — E1165 Type 2 diabetes mellitus with hyperglycemia: Secondary | ICD-10-CM | POA: Insufficient documentation

## 2012-10-11 DIAGNOSIS — E1129 Type 2 diabetes mellitus with other diabetic kidney complication: Secondary | ICD-10-CM | POA: Diagnosis not present

## 2012-10-11 MED ORDER — TECHNETIUM TC 99M SULFUR COLLOID
2.0000 | Freq: Once | INTRAVENOUS | Status: AC | PRN
  Administered 2012-10-11: 2 via ORAL

## 2012-11-09 ENCOUNTER — Ambulatory Visit (AMBULATORY_SURGERY_CENTER): Payer: Medicare Other | Admitting: *Deleted

## 2012-11-09 VITALS — Ht 69.0 in | Wt 204.0 lb

## 2012-11-09 DIAGNOSIS — Z1211 Encounter for screening for malignant neoplasm of colon: Secondary | ICD-10-CM

## 2012-11-09 MED ORDER — NA SULFATE-K SULFATE-MG SULF 17.5-3.13-1.6 GM/177ML PO SOLN: ORAL | Status: DC

## 2012-11-09 NOTE — Progress Notes (Signed)
Patient denies any allergies to eggs or soy.  Denies any problems with anesthesia.  

## 2012-11-30 ENCOUNTER — Encounter: Payer: Medicare Other | Admitting: Internal Medicine

## 2012-12-27 DIAGNOSIS — Z23 Encounter for immunization: Secondary | ICD-10-CM | POA: Diagnosis not present

## 2012-12-27 DIAGNOSIS — E782 Mixed hyperlipidemia: Secondary | ICD-10-CM | POA: Diagnosis not present

## 2012-12-27 DIAGNOSIS — E1129 Type 2 diabetes mellitus with other diabetic kidney complication: Secondary | ICD-10-CM | POA: Diagnosis not present

## 2012-12-27 DIAGNOSIS — Z6829 Body mass index (BMI) 29.0-29.9, adult: Secondary | ICD-10-CM | POA: Diagnosis not present

## 2012-12-27 DIAGNOSIS — E669 Obesity, unspecified: Secondary | ICD-10-CM | POA: Diagnosis not present

## 2012-12-27 DIAGNOSIS — I10 Essential (primary) hypertension: Secondary | ICD-10-CM | POA: Diagnosis not present

## 2012-12-27 DIAGNOSIS — I2510 Atherosclerotic heart disease of native coronary artery without angina pectoris: Secondary | ICD-10-CM | POA: Diagnosis not present

## 2013-03-26 ENCOUNTER — Encounter: Payer: Self-pay | Admitting: Internal Medicine

## 2013-03-26 ENCOUNTER — Ambulatory Visit (AMBULATORY_SURGERY_CENTER): Payer: Medicare Other | Admitting: Internal Medicine

## 2013-03-26 VITALS — BP 143/77 | HR 65 | Temp 97.7°F | Resp 24 | Ht 69.0 in | Wt 204.0 lb

## 2013-03-26 DIAGNOSIS — Z860100 Personal history of colon polyps, unspecified: Secondary | ICD-10-CM | POA: Insufficient documentation

## 2013-03-26 DIAGNOSIS — E119 Type 2 diabetes mellitus without complications: Secondary | ICD-10-CM | POA: Diagnosis not present

## 2013-03-26 DIAGNOSIS — Z1211 Encounter for screening for malignant neoplasm of colon: Secondary | ICD-10-CM | POA: Diagnosis not present

## 2013-03-26 DIAGNOSIS — D126 Benign neoplasm of colon, unspecified: Secondary | ICD-10-CM | POA: Diagnosis not present

## 2013-03-26 DIAGNOSIS — I2510 Atherosclerotic heart disease of native coronary artery without angina pectoris: Secondary | ICD-10-CM | POA: Diagnosis not present

## 2013-03-26 DIAGNOSIS — I10 Essential (primary) hypertension: Secondary | ICD-10-CM | POA: Diagnosis not present

## 2013-03-26 LAB — GLUCOSE, CAPILLARY
Glucose-Capillary: 126 mg/dL — ABNORMAL HIGH (ref 70–99)
Glucose-Capillary: 133 mg/dL — ABNORMAL HIGH (ref 70–99)

## 2013-03-26 MED ORDER — SODIUM CHLORIDE 0.9 % IV SOLN: 500.0000 mL | INTRAVENOUS | Status: DC

## 2013-03-26 NOTE — Progress Notes (Signed)
Called to room to assist during endoscopic procedure.  Patient ID and intended procedure confirmed with present staff. Received instructions for my participation in the procedure from the performing physician.  

## 2013-03-26 NOTE — Progress Notes (Signed)
Lidocaine-40mg IV prior to Propofol InductionPropofol given over incremental dosages 

## 2013-03-26 NOTE — Progress Notes (Signed)
No complaints noted in the recovery room. Maw   

## 2013-03-26 NOTE — Op Note (Signed)
[facility] [address] [place], [address]   COLONOSCOPY PROCEDURE REPORT  PATIENT: [name], [name]  MR#: [id number] BIRTHDATE: 11-28-1934 , 78  yrs. old GENDER: Male ENDOSCOPIST: Iva Boop, MD, [facility] REFERRED BY:W.  Buren Kos, M.D. PROCEDURE DATE:  03/26/2013 PROCEDURE:   Colonoscopy with biopsy and snare polypectomy First Screening Colonoscopy - Avg.  risk and is 50 yrs.  old or older Yes.  Prior Negative Screening - Now for repeat screening. N/A  History of Adenoma - Now for follow-up colonoscopy & has been > or = to 3 yrs.  N/A  Polyps Removed Today? Yes. ASA CLASS:   Class III INDICATIONS:average risk screening and first colonoscopy. MEDICATIONS: propofol (Diprivan) 250mg  IV, MAC sedation, administered by CRNA, and These medications were titrated to patient response per physician's verbal order  DESCRIPTION OF PROCEDURE:   After the risks benefits and alternatives of the procedure were thoroughly explained, informed consent was obtained.  A digital rectal exam revealed no abnormalities of the rectum, A digital rectal exam revealed no prostatic nodules, and A digital rectal exam revealed the prostate was not enlarged.   The [id number]  endoscope was introduced through the anus and advanced to the cecum, which was identified by both the appendix and ileocecal valve. No adverse events experienced.   The quality of the prep was Suprep good  The instrument was then slowly withdrawn as the colon was fully examined.      COLON FINDINGS: Seven diminutive sessile polyps were found in the ascending colon, at the hepatic flexure, in the transverse colon, at the splenic flexure, in the descending colon, and sigmoid colon. A polypectomy was performed with a cold snare or with cold forceps.  The resection was complete and the polyp tissue was completely retrieved.   The colon mucosa was otherwise normal.   A right colon retroflexion was  performed.  Retroflexed views revealed no abnormalities. The time to cecum=2 minutes 45 seconds. Withdrawal time=18 minutes 20 seconds.  The scope was withdrawn and the procedure completed. COMPLICATIONS: There were no complications.  ENDOSCOPIC IMPRESSION: 1.   Seven diminutive sessile polyps (maximum 5mm) were found in the ascending colon, at the hepatic flexure, in the transverse colon, at the splenic flexure, in the descending colon, and sigmoid colon; polypectomy was performed with a cold snare or with cold forceps 2.   The colon mucosa was otherwise normal - good prep - first screening colonoscopy  RECOMMENDATIONS: Timing/need of repeat colonoscopy will be determined by pathology findings.   eSigned:  Iva Boop, MD, [facility] 03/26/2013 9:02 AM   cc: Kari Baars, MD and The Patient   PATIENT NAME:  [name], [name] MR#: [id number]

## 2013-03-26 NOTE — Patient Instructions (Addendum)
I found and removed 7 small polyps that look benign.   I will let you know pathology results and when/if to have another routine colonoscopy by mail.  I appreciate the opportunity to care for you. Iva Boop, MD, [facility]    You may resume your current medications today. Handout was given to your care partner on polyps. Please call if any questions or concerns.   YOU HAD AN ENDOSCOPIC PROCEDURE TODAY AT THE [facility]: Refer to the procedure report that was given to you for any specific questions about what was found during the examination.  If the procedure report does not answer your questions, please call your gastroenterologist to clarify.  If you requested that your care partner not be given the details of your procedure findings, then the procedure report has been included in a sealed envelope for you to review at your convenience later.  YOU SHOULD EXPECT: Some feelings of bloating in the abdomen. Passage of more gas than usual.  Walking can help get rid of the air that was put into your GI tract during the procedure and reduce the bloating. If you had a lower endoscopy (such as a colonoscopy or flexible sigmoidoscopy) you may notice spotting of blood in your stool or on the toilet paper. If you underwent a bowel prep for your procedure, then you may not have a normal bowel movement for a few days.  DIET: Your first meal following the procedure should be a light meal and then it is ok to progress to your normal diet.  A half-sandwich or bowl of soup is an example of a good first meal.  Heavy or fried foods are harder to digest and may make you feel nauseous or bloated.  Likewise meals heavy in dairy and vegetables can cause extra gas to form and this can also increase the bloating.  Drink plenty of fluids but you should avoid alcoholic beverages for 24 hours.  ACTIVITY: Your care partner should take you home directly after the procedure.  You should plan to take it easy,  moving slowly for the rest of the day.  You can resume normal activity the day after the procedure however you should NOT DRIVE or use heavy machinery for 24 hours (because of the sedation medicines used during the test).    SYMPTOMS TO REPORT IMMEDIATELY: A gastroenterologist can be reached at any hour.  During normal business hours, 8:30 AM to 5:00 PM Monday through Friday, call [phone].  After hours and on weekends, please call the GI answering service at [phone] who will take a message and have the physician on call contact you.   Following lower endoscopy (colonoscopy or flexible sigmoidoscopy):  Excessive amounts of blood in the stool  Significant tenderness or worsening of abdominal pains  Swelling of the abdomen that is new, acute  Fever of 100F or higher   FOLLOW UP: If any biopsies were taken you will be contacted by phone or by letter within the next 1-3 weeks.  Call your gastroenterologist if you have not heard about the biopsies in 3 weeks.  Our staff will call the home number listed on your records the next business day following your procedure to check on you and address any questions or concerns that you may have at that time regarding the information given to you following your procedure. This is a courtesy call and so if there is no answer at the home number and we have not heard from  you through the emergency physician on call, we will assume that you have returned to your regular daily activities without incident.  SIGNATURES/CONFIDENTIALITY: You and/or your care partner have signed paperwork which will be entered into your electronic medical record.  These signatures attest to the fact that that the information above on your After Visit Summary has been reviewed and is understood.  Full responsibility of the confidentiality of this discharge information lies with you and/or your care-partner.

## 2013-03-27 ENCOUNTER — Telehealth: Payer: Self-pay

## 2013-03-27 NOTE — Telephone Encounter (Signed)
Unable to leave message line was busy. attempt times two.

## 2013-04-01 ENCOUNTER — Encounter: Payer: Self-pay | Admitting: Internal Medicine

## 2013-04-01 NOTE — Progress Notes (Signed)
Quick Note:  5-6 small adenomas ? If needs repeat colonoscopy given age Rev recall 3 years ______

## 2013-04-29 DIAGNOSIS — IMO0002 Reserved for concepts with insufficient information to code with codable children: Secondary | ICD-10-CM | POA: Diagnosis not present

## 2013-04-29 DIAGNOSIS — M999 Biomechanical lesion, unspecified: Secondary | ICD-10-CM | POA: Diagnosis not present

## 2013-04-29 DIAGNOSIS — M5137 Other intervertebral disc degeneration, lumbosacral region: Secondary | ICD-10-CM | POA: Diagnosis not present

## 2013-04-29 DIAGNOSIS — M25559 Pain in unspecified hip: Secondary | ICD-10-CM | POA: Diagnosis not present

## 2013-04-30 DIAGNOSIS — IMO0002 Reserved for concepts with insufficient information to code with codable children: Secondary | ICD-10-CM | POA: Diagnosis not present

## 2013-04-30 DIAGNOSIS — M5137 Other intervertebral disc degeneration, lumbosacral region: Secondary | ICD-10-CM | POA: Diagnosis not present

## 2013-04-30 DIAGNOSIS — M25559 Pain in unspecified hip: Secondary | ICD-10-CM | POA: Diagnosis not present

## 2013-04-30 DIAGNOSIS — M999 Biomechanical lesion, unspecified: Secondary | ICD-10-CM | POA: Diagnosis not present

## 2013-05-02 DIAGNOSIS — IMO0002 Reserved for concepts with insufficient information to code with codable children: Secondary | ICD-10-CM | POA: Diagnosis not present

## 2013-05-02 DIAGNOSIS — M5137 Other intervertebral disc degeneration, lumbosacral region: Secondary | ICD-10-CM | POA: Diagnosis not present

## 2013-05-02 DIAGNOSIS — M25559 Pain in unspecified hip: Secondary | ICD-10-CM | POA: Diagnosis not present

## 2013-05-02 DIAGNOSIS — M999 Biomechanical lesion, unspecified: Secondary | ICD-10-CM | POA: Diagnosis not present

## 2013-05-03 DIAGNOSIS — M999 Biomechanical lesion, unspecified: Secondary | ICD-10-CM | POA: Diagnosis not present

## 2013-05-03 DIAGNOSIS — IMO0002 Reserved for concepts with insufficient information to code with codable children: Secondary | ICD-10-CM | POA: Diagnosis not present

## 2013-05-03 DIAGNOSIS — M25559 Pain in unspecified hip: Secondary | ICD-10-CM | POA: Diagnosis not present

## 2013-05-03 DIAGNOSIS — M5137 Other intervertebral disc degeneration, lumbosacral region: Secondary | ICD-10-CM | POA: Diagnosis not present

## 2013-05-06 DIAGNOSIS — M5137 Other intervertebral disc degeneration, lumbosacral region: Secondary | ICD-10-CM | POA: Diagnosis not present

## 2013-05-06 DIAGNOSIS — IMO0002 Reserved for concepts with insufficient information to code with codable children: Secondary | ICD-10-CM | POA: Diagnosis not present

## 2013-05-06 DIAGNOSIS — M999 Biomechanical lesion, unspecified: Secondary | ICD-10-CM | POA: Diagnosis not present

## 2013-05-06 DIAGNOSIS — M25559 Pain in unspecified hip: Secondary | ICD-10-CM | POA: Diagnosis not present

## 2013-05-07 DIAGNOSIS — M25559 Pain in unspecified hip: Secondary | ICD-10-CM | POA: Diagnosis not present

## 2013-05-07 DIAGNOSIS — IMO0002 Reserved for concepts with insufficient information to code with codable children: Secondary | ICD-10-CM | POA: Diagnosis not present

## 2013-05-07 DIAGNOSIS — M999 Biomechanical lesion, unspecified: Secondary | ICD-10-CM | POA: Diagnosis not present

## 2013-05-07 DIAGNOSIS — M5137 Other intervertebral disc degeneration, lumbosacral region: Secondary | ICD-10-CM | POA: Diagnosis not present

## 2013-05-08 ENCOUNTER — Encounter: Payer: Self-pay | Admitting: Cardiovascular Disease

## 2013-05-08 DIAGNOSIS — Z125 Encounter for screening for malignant neoplasm of prostate: Secondary | ICD-10-CM | POA: Diagnosis not present

## 2013-05-08 DIAGNOSIS — E1129 Type 2 diabetes mellitus with other diabetic kidney complication: Secondary | ICD-10-CM | POA: Diagnosis not present

## 2013-05-08 DIAGNOSIS — I10 Essential (primary) hypertension: Secondary | ICD-10-CM | POA: Diagnosis not present

## 2013-05-08 DIAGNOSIS — E782 Mixed hyperlipidemia: Secondary | ICD-10-CM | POA: Diagnosis not present

## 2013-05-09 DIAGNOSIS — M25559 Pain in unspecified hip: Secondary | ICD-10-CM | POA: Diagnosis not present

## 2013-05-09 DIAGNOSIS — M999 Biomechanical lesion, unspecified: Secondary | ICD-10-CM | POA: Diagnosis not present

## 2013-05-09 DIAGNOSIS — IMO0002 Reserved for concepts with insufficient information to code with codable children: Secondary | ICD-10-CM | POA: Diagnosis not present

## 2013-05-09 DIAGNOSIS — M5137 Other intervertebral disc degeneration, lumbosacral region: Secondary | ICD-10-CM | POA: Diagnosis not present

## 2013-05-13 DIAGNOSIS — M5137 Other intervertebral disc degeneration, lumbosacral region: Secondary | ICD-10-CM | POA: Diagnosis not present

## 2013-05-13 DIAGNOSIS — M25559 Pain in unspecified hip: Secondary | ICD-10-CM | POA: Diagnosis not present

## 2013-05-13 DIAGNOSIS — M999 Biomechanical lesion, unspecified: Secondary | ICD-10-CM | POA: Diagnosis not present

## 2013-05-13 DIAGNOSIS — IMO0002 Reserved for concepts with insufficient information to code with codable children: Secondary | ICD-10-CM | POA: Diagnosis not present

## 2013-05-14 DIAGNOSIS — M5137 Other intervertebral disc degeneration, lumbosacral region: Secondary | ICD-10-CM | POA: Diagnosis not present

## 2013-05-14 DIAGNOSIS — M999 Biomechanical lesion, unspecified: Secondary | ICD-10-CM | POA: Diagnosis not present

## 2013-05-14 DIAGNOSIS — M25559 Pain in unspecified hip: Secondary | ICD-10-CM | POA: Diagnosis not present

## 2013-05-14 DIAGNOSIS — IMO0002 Reserved for concepts with insufficient information to code with codable children: Secondary | ICD-10-CM | POA: Diagnosis not present

## 2013-05-15 DIAGNOSIS — E1129 Type 2 diabetes mellitus with other diabetic kidney complication: Secondary | ICD-10-CM | POA: Diagnosis not present

## 2013-05-15 DIAGNOSIS — Z1331 Encounter for screening for depression: Secondary | ICD-10-CM | POA: Diagnosis not present

## 2013-05-15 DIAGNOSIS — G5710 Meralgia paresthetica, unspecified lower limb: Secondary | ICD-10-CM | POA: Diagnosis not present

## 2013-05-15 DIAGNOSIS — M999 Biomechanical lesion, unspecified: Secondary | ICD-10-CM | POA: Diagnosis not present

## 2013-05-15 DIAGNOSIS — I10 Essential (primary) hypertension: Secondary | ICD-10-CM | POA: Diagnosis not present

## 2013-05-15 DIAGNOSIS — M5137 Other intervertebral disc degeneration, lumbosacral region: Secondary | ICD-10-CM | POA: Diagnosis not present

## 2013-05-15 DIAGNOSIS — E782 Mixed hyperlipidemia: Secondary | ICD-10-CM | POA: Diagnosis not present

## 2013-05-15 DIAGNOSIS — M25559 Pain in unspecified hip: Secondary | ICD-10-CM | POA: Diagnosis not present

## 2013-05-15 DIAGNOSIS — Z23 Encounter for immunization: Secondary | ICD-10-CM | POA: Diagnosis not present

## 2013-05-15 DIAGNOSIS — IMO0002 Reserved for concepts with insufficient information to code with codable children: Secondary | ICD-10-CM | POA: Diagnosis not present

## 2013-05-15 DIAGNOSIS — I2510 Atherosclerotic heart disease of native coronary artery without angina pectoris: Secondary | ICD-10-CM | POA: Diagnosis not present

## 2013-05-15 DIAGNOSIS — R809 Proteinuria, unspecified: Secondary | ICD-10-CM | POA: Diagnosis not present

## 2013-05-15 DIAGNOSIS — Z0000 Encounter for general adult medical examination without abnormal findings: Secondary | ICD-10-CM | POA: Diagnosis not present

## 2013-05-15 DIAGNOSIS — E669 Obesity, unspecified: Secondary | ICD-10-CM | POA: Diagnosis not present

## 2013-05-20 DIAGNOSIS — M5137 Other intervertebral disc degeneration, lumbosacral region: Secondary | ICD-10-CM | POA: Diagnosis not present

## 2013-05-20 DIAGNOSIS — M999 Biomechanical lesion, unspecified: Secondary | ICD-10-CM | POA: Diagnosis not present

## 2013-05-20 DIAGNOSIS — M25559 Pain in unspecified hip: Secondary | ICD-10-CM | POA: Diagnosis not present

## 2013-05-20 DIAGNOSIS — IMO0002 Reserved for concepts with insufficient information to code with codable children: Secondary | ICD-10-CM | POA: Diagnosis not present

## 2013-05-22 DIAGNOSIS — M25559 Pain in unspecified hip: Secondary | ICD-10-CM | POA: Diagnosis not present

## 2013-05-22 DIAGNOSIS — IMO0002 Reserved for concepts with insufficient information to code with codable children: Secondary | ICD-10-CM | POA: Diagnosis not present

## 2013-05-22 DIAGNOSIS — E119 Type 2 diabetes mellitus without complications: Secondary | ICD-10-CM | POA: Diagnosis not present

## 2013-05-22 DIAGNOSIS — M999 Biomechanical lesion, unspecified: Secondary | ICD-10-CM | POA: Diagnosis not present

## 2013-05-22 DIAGNOSIS — I10 Essential (primary) hypertension: Secondary | ICD-10-CM | POA: Diagnosis not present

## 2013-05-22 DIAGNOSIS — N040 Nephrotic syndrome with minor glomerular abnormality: Secondary | ICD-10-CM | POA: Diagnosis not present

## 2013-05-22 DIAGNOSIS — M5137 Other intervertebral disc degeneration, lumbosacral region: Secondary | ICD-10-CM | POA: Diagnosis not present

## 2013-06-07 DIAGNOSIS — M5137 Other intervertebral disc degeneration, lumbosacral region: Secondary | ICD-10-CM | POA: Diagnosis not present

## 2013-06-07 DIAGNOSIS — M999 Biomechanical lesion, unspecified: Secondary | ICD-10-CM | POA: Diagnosis not present

## 2013-06-07 DIAGNOSIS — M25559 Pain in unspecified hip: Secondary | ICD-10-CM | POA: Diagnosis not present

## 2013-06-07 DIAGNOSIS — IMO0002 Reserved for concepts with insufficient information to code with codable children: Secondary | ICD-10-CM | POA: Diagnosis not present

## 2013-06-10 DIAGNOSIS — M5137 Other intervertebral disc degeneration, lumbosacral region: Secondary | ICD-10-CM | POA: Diagnosis not present

## 2013-06-10 DIAGNOSIS — IMO0002 Reserved for concepts with insufficient information to code with codable children: Secondary | ICD-10-CM | POA: Diagnosis not present

## 2013-06-10 DIAGNOSIS — M999 Biomechanical lesion, unspecified: Secondary | ICD-10-CM | POA: Diagnosis not present

## 2013-06-10 DIAGNOSIS — M25559 Pain in unspecified hip: Secondary | ICD-10-CM | POA: Diagnosis not present

## 2013-06-17 DIAGNOSIS — M5137 Other intervertebral disc degeneration, lumbosacral region: Secondary | ICD-10-CM | POA: Diagnosis not present

## 2013-06-17 DIAGNOSIS — M25559 Pain in unspecified hip: Secondary | ICD-10-CM | POA: Diagnosis not present

## 2013-06-17 DIAGNOSIS — M999 Biomechanical lesion, unspecified: Secondary | ICD-10-CM | POA: Diagnosis not present

## 2013-06-17 DIAGNOSIS — IMO0002 Reserved for concepts with insufficient information to code with codable children: Secondary | ICD-10-CM | POA: Diagnosis not present

## 2013-06-19 DIAGNOSIS — IMO0002 Reserved for concepts with insufficient information to code with codable children: Secondary | ICD-10-CM | POA: Diagnosis not present

## 2013-06-19 DIAGNOSIS — M999 Biomechanical lesion, unspecified: Secondary | ICD-10-CM | POA: Diagnosis not present

## 2013-06-19 DIAGNOSIS — M5137 Other intervertebral disc degeneration, lumbosacral region: Secondary | ICD-10-CM | POA: Diagnosis not present

## 2013-06-19 DIAGNOSIS — M25559 Pain in unspecified hip: Secondary | ICD-10-CM | POA: Diagnosis not present

## 2013-07-29 ENCOUNTER — Encounter: Payer: Self-pay | Admitting: Cardiovascular Disease

## 2013-08-26 DIAGNOSIS — D692 Other nonthrombocytopenic purpura: Secondary | ICD-10-CM | POA: Diagnosis not present

## 2013-08-26 DIAGNOSIS — D239 Other benign neoplasm of skin, unspecified: Secondary | ICD-10-CM | POA: Diagnosis not present

## 2013-08-26 DIAGNOSIS — L821 Other seborrheic keratosis: Secondary | ICD-10-CM | POA: Diagnosis not present

## 2013-08-26 DIAGNOSIS — D1801 Hemangioma of skin and subcutaneous tissue: Secondary | ICD-10-CM | POA: Diagnosis not present

## 2013-08-26 DIAGNOSIS — L570 Actinic keratosis: Secondary | ICD-10-CM | POA: Diagnosis not present

## 2013-09-05 DIAGNOSIS — H2589 Other age-related cataract: Secondary | ICD-10-CM | POA: Diagnosis not present

## 2013-09-05 DIAGNOSIS — Z961 Presence of intraocular lens: Secondary | ICD-10-CM | POA: Diagnosis not present

## 2013-09-10 DIAGNOSIS — Z6829 Body mass index (BMI) 29.0-29.9, adult: Secondary | ICD-10-CM | POA: Diagnosis not present

## 2013-09-10 DIAGNOSIS — E669 Obesity, unspecified: Secondary | ICD-10-CM | POA: Diagnosis not present

## 2013-09-10 DIAGNOSIS — E1129 Type 2 diabetes mellitus with other diabetic kidney complication: Secondary | ICD-10-CM | POA: Diagnosis not present

## 2013-09-10 DIAGNOSIS — E782 Mixed hyperlipidemia: Secondary | ICD-10-CM | POA: Diagnosis not present

## 2013-09-10 DIAGNOSIS — I2510 Atherosclerotic heart disease of native coronary artery without angina pectoris: Secondary | ICD-10-CM | POA: Diagnosis not present

## 2013-09-10 DIAGNOSIS — M545 Low back pain, unspecified: Secondary | ICD-10-CM | POA: Diagnosis not present

## 2013-09-10 DIAGNOSIS — I10 Essential (primary) hypertension: Secondary | ICD-10-CM | POA: Diagnosis not present

## 2013-09-18 ENCOUNTER — Ambulatory Visit (INDEPENDENT_AMBULATORY_CARE_PROVIDER_SITE_OTHER): Payer: Medicare Other | Admitting: Cardiovascular Disease

## 2013-09-18 ENCOUNTER — Encounter: Payer: Self-pay | Admitting: Cardiovascular Disease

## 2013-09-18 VITALS — BP 114/60 | HR 58 | Ht 69.0 in | Wt 196.0 lb

## 2013-09-18 DIAGNOSIS — I10 Essential (primary) hypertension: Secondary | ICD-10-CM

## 2013-09-18 DIAGNOSIS — E785 Hyperlipidemia, unspecified: Secondary | ICD-10-CM | POA: Diagnosis not present

## 2013-09-18 DIAGNOSIS — I2510 Atherosclerotic heart disease of native coronary artery without angina pectoris: Secondary | ICD-10-CM | POA: Diagnosis not present

## 2013-09-18 NOTE — Patient Instructions (Signed)
Your physician wants you to follow-up in: 1 YEAR with Dr Cooper.  You will receive a reminder letter in the mail two months in advance. If you don't receive a letter, please call our office to schedule the follow-up appointment.  Your physician recommends that you continue on your current medications as directed. Please refer to the Current Medication list given to you today.  

## 2013-09-18 NOTE — Progress Notes (Signed)
HPI:  This is a 79 year old gentleman presenting for cardiac followup. He has coronary artery disease and underwent coronary bypass surgery in 2008 after presenting with unstable angina. He had severe multivessel disease and underwent LIMA to LAD grafting, sequential saphenous vein graft 2 first, second, and third OM's, and sequential saphenous vein grafting to the mid right coronary artery and right PDA. He's had no recurrent ischemic events and his last stress test was in 2011 when he walked 8 minutes with no significant ST or T wave changes. His lipids and hemoglobin A1c are followed by Dr. Osborne Casco.  He has an occasional pain over on the left lateral chest wall. This is not associated with physical exertion. He does note that he had similar symptoms before bypass surgery but they were much more intense and frequent. He has mild dyspnea with exertion with climbing stairs. He denies edema, orthopnea, or PND. Overall he feels that he is getting along well. His wife passed away last year he does not eat as well as he previously did.   Outpatient Encounter Prescriptions as of 09/18/2013  Medication Sig  . aspirin 325 MG tablet Take 325 mg by mouth daily.  Marland Kitchen atorvastatin (LIPITOR) 40 MG tablet Take 40 mg by mouth daily.  . carvedilol (COREG) 12.5 MG tablet Take 12.5 mg by mouth 2 (two) times daily with a meal.  . furosemide (LASIX) 20 MG tablet Take one tablet by mouth  . INVESTIGATIONAL DRUG SIMPLE RECORD Take 1 mg by mouth daily. FOR CHOLESTEROL.  Marland Kitchen INVOKANA 300 MG TABS   . losartan (COZAAR) 100 MG tablet Take 100 mg by mouth daily.  . montelukast (SINGULAIR) 10 MG tablet Take 10 mg by mouth at bedtime.  . Saxagliptin-Metformin (KOMBIGLYZE XR) 2.08-998 MG TB24 Take 1,000 mg by mouth daily.   Marland Kitchen spironolactone (ALDACTONE) 25 MG tablet Take 1/2 tab daily  . vitamin E 100 UNIT capsule Take 100 Units by mouth daily.  . [DISCONTINUED] furosemide (LASIX) 20 MG tablet TAKE 1 TABLET BY MOUTH TWICE A DAY      No Known Allergies  Past Medical History  Diagnosis Date  . CAD (coronary artery disease) January 2008    LIMA to LAD, sequential SVG to first, second, and third OM's, sequential SVG to mid RCA and PDA   . HTN (hypertension)   . Hyperlipidemia   . Acute renal failure     secondary to nephrotic syndrome post cath, resolved with steroids  . Diabetes mellitus without complication     ROS: Negative except as per HPI  BP 114/60  Pulse 58  Ht 5\' 9"  (1.753 m)  Wt 88.905 kg (196 lb)  BMI 28.93 kg/m2  PHYSICAL EXAM: Pt is alert and oriented, pleasant overweight male in NAD HEENT: normal Neck: JVP - normal, carotids 2+= without bruits Lungs: CTA bilaterally CV: RRR without murmur or gallop Abd: soft, NT, Positive BS, obese Ext: no C/C/E, distal pulses intact and equal Skin: warm/dry no rash  EKG:  Sinus bradycardia 58 beats per minute, first degree AV block, minimal voltage criteria for LVH maybe normal variant, age indeterminate inferior infarct. There is no significant change from previous tracing 09/05/2012.  ASSESSMENT AND PLAN: 1. CAD status post CABG. Recommend continued medical therapy. We discussed his symptoms of occasional left-sided chest discomfort. He tells me this is quite rare. Will plan on no further testing at this time, but I advised her symptoms accelerate that he should contact us and we'll arrange appropriate testing at  that time. He is on aspirin, a statin drug, and a beta blocker.  2. Hypertension. Blood pressure is well controlled on his current medical program.  3. Hyperlipidemia. Followed by Dr Brigitte Pulse. On atorvastatin.  Sherren Mocha 09/18/2013 9:09 AM

## 2013-09-20 ENCOUNTER — Ambulatory Visit: Payer: Medicare Other | Admitting: Cardiovascular Disease

## 2013-09-23 DIAGNOSIS — R1031 Right lower quadrant pain: Secondary | ICD-10-CM | POA: Diagnosis not present

## 2013-09-23 DIAGNOSIS — I10 Essential (primary) hypertension: Secondary | ICD-10-CM | POA: Diagnosis not present

## 2013-09-23 DIAGNOSIS — E1129 Type 2 diabetes mellitus with other diabetic kidney complication: Secondary | ICD-10-CM | POA: Diagnosis not present

## 2013-09-24 DIAGNOSIS — E1129 Type 2 diabetes mellitus with other diabetic kidney complication: Secondary | ICD-10-CM | POA: Diagnosis not present

## 2014-01-08 DIAGNOSIS — Z6829 Body mass index (BMI) 29.0-29.9, adult: Secondary | ICD-10-CM | POA: Diagnosis not present

## 2014-01-08 DIAGNOSIS — I10 Essential (primary) hypertension: Secondary | ICD-10-CM | POA: Diagnosis not present

## 2014-01-08 DIAGNOSIS — E669 Obesity, unspecified: Secondary | ICD-10-CM | POA: Diagnosis not present

## 2014-01-08 DIAGNOSIS — Z23 Encounter for immunization: Secondary | ICD-10-CM | POA: Diagnosis not present

## 2014-01-08 DIAGNOSIS — E782 Mixed hyperlipidemia: Secondary | ICD-10-CM | POA: Diagnosis not present

## 2014-01-08 DIAGNOSIS — F528 Other sexual dysfunction not due to a substance or known physiological condition: Secondary | ICD-10-CM | POA: Diagnosis not present

## 2014-01-08 DIAGNOSIS — I2510 Atherosclerotic heart disease of native coronary artery without angina pectoris: Secondary | ICD-10-CM | POA: Diagnosis not present

## 2014-01-08 DIAGNOSIS — E1129 Type 2 diabetes mellitus with other diabetic kidney complication: Secondary | ICD-10-CM | POA: Diagnosis not present

## 2014-03-11 DIAGNOSIS — Z6829 Body mass index (BMI) 29.0-29.9, adult: Secondary | ICD-10-CM | POA: Diagnosis not present

## 2014-03-11 DIAGNOSIS — R05 Cough: Secondary | ICD-10-CM | POA: Diagnosis not present

## 2014-03-11 DIAGNOSIS — Z113 Encounter for screening for infections with a predominantly sexual mode of transmission: Secondary | ICD-10-CM | POA: Diagnosis not present

## 2014-05-05 DIAGNOSIS — E782 Mixed hyperlipidemia: Secondary | ICD-10-CM | POA: Diagnosis not present

## 2014-05-05 DIAGNOSIS — Z0000 Encounter for general adult medical examination without abnormal findings: Secondary | ICD-10-CM | POA: Diagnosis not present

## 2014-05-05 DIAGNOSIS — E1129 Type 2 diabetes mellitus with other diabetic kidney complication: Secondary | ICD-10-CM | POA: Diagnosis not present

## 2014-05-05 DIAGNOSIS — I10 Essential (primary) hypertension: Secondary | ICD-10-CM | POA: Diagnosis not present

## 2014-05-05 DIAGNOSIS — Z125 Encounter for screening for malignant neoplasm of prostate: Secondary | ICD-10-CM | POA: Diagnosis not present

## 2014-05-16 DIAGNOSIS — Z6829 Body mass index (BMI) 29.0-29.9, adult: Secondary | ICD-10-CM | POA: Diagnosis not present

## 2014-05-16 DIAGNOSIS — E1129 Type 2 diabetes mellitus with other diabetic kidney complication: Secondary | ICD-10-CM | POA: Diagnosis not present

## 2014-05-16 DIAGNOSIS — Z23 Encounter for immunization: Secondary | ICD-10-CM | POA: Diagnosis not present

## 2014-05-16 DIAGNOSIS — N401 Enlarged prostate with lower urinary tract symptoms: Secondary | ICD-10-CM | POA: Diagnosis not present

## 2014-05-16 DIAGNOSIS — R972 Elevated prostate specific antigen [PSA]: Secondary | ICD-10-CM | POA: Diagnosis not present

## 2014-05-16 DIAGNOSIS — Z0000 Encounter for general adult medical examination without abnormal findings: Secondary | ICD-10-CM | POA: Diagnosis not present

## 2014-05-16 DIAGNOSIS — R809 Proteinuria, unspecified: Secondary | ICD-10-CM | POA: Diagnosis not present

## 2014-05-16 DIAGNOSIS — Z125 Encounter for screening for malignant neoplasm of prostate: Secondary | ICD-10-CM | POA: Diagnosis not present

## 2014-05-16 DIAGNOSIS — I10 Essential (primary) hypertension: Secondary | ICD-10-CM | POA: Diagnosis not present

## 2014-05-16 DIAGNOSIS — E782 Mixed hyperlipidemia: Secondary | ICD-10-CM | POA: Diagnosis not present

## 2014-05-16 DIAGNOSIS — I2510 Atherosclerotic heart disease of native coronary artery without angina pectoris: Secondary | ICD-10-CM | POA: Diagnosis not present

## 2014-05-16 DIAGNOSIS — F5221 Male erectile disorder: Secondary | ICD-10-CM | POA: Diagnosis not present

## 2014-07-04 DIAGNOSIS — I10 Essential (primary) hypertension: Secondary | ICD-10-CM | POA: Diagnosis not present

## 2014-07-04 DIAGNOSIS — E119 Type 2 diabetes mellitus without complications: Secondary | ICD-10-CM | POA: Diagnosis not present

## 2014-07-04 DIAGNOSIS — N040 Nephrotic syndrome with minor glomerular abnormality: Secondary | ICD-10-CM | POA: Diagnosis not present

## 2014-09-23 DIAGNOSIS — F5221 Male erectile disorder: Secondary | ICD-10-CM | POA: Diagnosis not present

## 2014-09-23 DIAGNOSIS — I10 Essential (primary) hypertension: Secondary | ICD-10-CM | POA: Diagnosis not present

## 2014-09-23 DIAGNOSIS — I2510 Atherosclerotic heart disease of native coronary artery without angina pectoris: Secondary | ICD-10-CM | POA: Diagnosis not present

## 2014-09-23 DIAGNOSIS — A630 Anogenital (venereal) warts: Secondary | ICD-10-CM | POA: Diagnosis not present

## 2014-09-23 DIAGNOSIS — E782 Mixed hyperlipidemia: Secondary | ICD-10-CM | POA: Diagnosis not present

## 2014-09-23 DIAGNOSIS — E1129 Type 2 diabetes mellitus with other diabetic kidney complication: Secondary | ICD-10-CM | POA: Diagnosis not present

## 2014-09-23 DIAGNOSIS — Z6829 Body mass index (BMI) 29.0-29.9, adult: Secondary | ICD-10-CM | POA: Diagnosis not present

## 2014-09-23 DIAGNOSIS — R972 Elevated prostate specific antigen [PSA]: Secondary | ICD-10-CM | POA: Diagnosis not present

## 2014-09-23 DIAGNOSIS — E669 Obesity, unspecified: Secondary | ICD-10-CM | POA: Diagnosis not present

## 2014-10-30 DIAGNOSIS — L820 Inflamed seborrheic keratosis: Secondary | ICD-10-CM | POA: Diagnosis not present

## 2014-10-30 DIAGNOSIS — D692 Other nonthrombocytopenic purpura: Secondary | ICD-10-CM | POA: Diagnosis not present

## 2014-12-05 ENCOUNTER — Ambulatory Visit: Payer: BLUE CROSS/BLUE SHIELD | Admitting: Cardiovascular Disease

## 2014-12-08 DIAGNOSIS — M25552 Pain in left hip: Secondary | ICD-10-CM | POA: Diagnosis not present

## 2014-12-08 DIAGNOSIS — M545 Low back pain: Secondary | ICD-10-CM | POA: Diagnosis not present

## 2014-12-09 DIAGNOSIS — H25819 Combined forms of age-related cataract, unspecified eye: Secondary | ICD-10-CM | POA: Diagnosis not present

## 2014-12-09 DIAGNOSIS — H5213 Myopia, bilateral: Secondary | ICD-10-CM | POA: Diagnosis not present

## 2014-12-09 DIAGNOSIS — H52223 Regular astigmatism, bilateral: Secondary | ICD-10-CM | POA: Diagnosis not present

## 2014-12-09 DIAGNOSIS — H34832 Tributary (branch) retinal vein occlusion, left eye: Secondary | ICD-10-CM | POA: Diagnosis not present

## 2014-12-23 DIAGNOSIS — H10411 Chronic giant papillary conjunctivitis, right eye: Secondary | ICD-10-CM | POA: Diagnosis not present

## 2014-12-23 DIAGNOSIS — M5416 Radiculopathy, lumbar region: Secondary | ICD-10-CM | POA: Diagnosis not present

## 2014-12-26 DIAGNOSIS — M545 Low back pain: Secondary | ICD-10-CM | POA: Diagnosis not present

## 2014-12-31 DIAGNOSIS — M545 Low back pain: Secondary | ICD-10-CM | POA: Diagnosis not present

## 2014-12-31 DIAGNOSIS — M5416 Radiculopathy, lumbar region: Secondary | ICD-10-CM | POA: Diagnosis not present

## 2014-12-31 DIAGNOSIS — H10413 Chronic giant papillary conjunctivitis, bilateral: Secondary | ICD-10-CM | POA: Diagnosis not present

## 2015-01-01 DIAGNOSIS — M5416 Radiculopathy, lumbar region: Secondary | ICD-10-CM | POA: Diagnosis not present

## 2015-01-01 DIAGNOSIS — M545 Low back pain: Secondary | ICD-10-CM | POA: Diagnosis not present

## 2015-01-14 DIAGNOSIS — M5136 Other intervertebral disc degeneration, lumbar region: Secondary | ICD-10-CM | POA: Diagnosis not present

## 2015-01-14 DIAGNOSIS — M9905 Segmental and somatic dysfunction of pelvic region: Secondary | ICD-10-CM | POA: Diagnosis not present

## 2015-01-14 DIAGNOSIS — M5126 Other intervertebral disc displacement, lumbar region: Secondary | ICD-10-CM | POA: Diagnosis not present

## 2015-01-14 DIAGNOSIS — M9903 Segmental and somatic dysfunction of lumbar region: Secondary | ICD-10-CM | POA: Diagnosis not present

## 2015-01-14 DIAGNOSIS — M5417 Radiculopathy, lumbosacral region: Secondary | ICD-10-CM | POA: Diagnosis not present

## 2015-01-14 DIAGNOSIS — M9904 Segmental and somatic dysfunction of sacral region: Secondary | ICD-10-CM | POA: Diagnosis not present

## 2015-01-16 DIAGNOSIS — M9904 Segmental and somatic dysfunction of sacral region: Secondary | ICD-10-CM | POA: Diagnosis not present

## 2015-01-16 DIAGNOSIS — M9903 Segmental and somatic dysfunction of lumbar region: Secondary | ICD-10-CM | POA: Diagnosis not present

## 2015-01-16 DIAGNOSIS — M5126 Other intervertebral disc displacement, lumbar region: Secondary | ICD-10-CM | POA: Diagnosis not present

## 2015-01-16 DIAGNOSIS — M5417 Radiculopathy, lumbosacral region: Secondary | ICD-10-CM | POA: Diagnosis not present

## 2015-01-16 DIAGNOSIS — M5136 Other intervertebral disc degeneration, lumbar region: Secondary | ICD-10-CM | POA: Diagnosis not present

## 2015-01-16 DIAGNOSIS — M9905 Segmental and somatic dysfunction of pelvic region: Secondary | ICD-10-CM | POA: Diagnosis not present

## 2015-01-19 DIAGNOSIS — M5126 Other intervertebral disc displacement, lumbar region: Secondary | ICD-10-CM | POA: Diagnosis not present

## 2015-01-19 DIAGNOSIS — M5417 Radiculopathy, lumbosacral region: Secondary | ICD-10-CM | POA: Diagnosis not present

## 2015-01-19 DIAGNOSIS — M9905 Segmental and somatic dysfunction of pelvic region: Secondary | ICD-10-CM | POA: Diagnosis not present

## 2015-01-19 DIAGNOSIS — M9904 Segmental and somatic dysfunction of sacral region: Secondary | ICD-10-CM | POA: Diagnosis not present

## 2015-01-19 DIAGNOSIS — M9903 Segmental and somatic dysfunction of lumbar region: Secondary | ICD-10-CM | POA: Diagnosis not present

## 2015-01-19 DIAGNOSIS — M5136 Other intervertebral disc degeneration, lumbar region: Secondary | ICD-10-CM | POA: Diagnosis not present

## 2015-01-20 DIAGNOSIS — M9904 Segmental and somatic dysfunction of sacral region: Secondary | ICD-10-CM | POA: Diagnosis not present

## 2015-01-20 DIAGNOSIS — M9903 Segmental and somatic dysfunction of lumbar region: Secondary | ICD-10-CM | POA: Diagnosis not present

## 2015-01-20 DIAGNOSIS — M5126 Other intervertebral disc displacement, lumbar region: Secondary | ICD-10-CM | POA: Diagnosis not present

## 2015-01-20 DIAGNOSIS — M9905 Segmental and somatic dysfunction of pelvic region: Secondary | ICD-10-CM | POA: Diagnosis not present

## 2015-01-20 DIAGNOSIS — M5417 Radiculopathy, lumbosacral region: Secondary | ICD-10-CM | POA: Diagnosis not present

## 2015-01-20 DIAGNOSIS — M5136 Other intervertebral disc degeneration, lumbar region: Secondary | ICD-10-CM | POA: Diagnosis not present

## 2015-01-22 DIAGNOSIS — M545 Low back pain: Secondary | ICD-10-CM | POA: Diagnosis not present

## 2015-01-22 DIAGNOSIS — M4806 Spinal stenosis, lumbar region: Secondary | ICD-10-CM | POA: Diagnosis not present

## 2015-01-22 DIAGNOSIS — M5416 Radiculopathy, lumbar region: Secondary | ICD-10-CM | POA: Diagnosis not present

## 2015-01-23 DIAGNOSIS — M5416 Radiculopathy, lumbar region: Secondary | ICD-10-CM | POA: Diagnosis not present

## 2015-01-23 DIAGNOSIS — M545 Low back pain: Secondary | ICD-10-CM | POA: Diagnosis not present

## 2015-01-23 DIAGNOSIS — M4806 Spinal stenosis, lumbar region: Secondary | ICD-10-CM | POA: Diagnosis not present

## 2015-02-03 DIAGNOSIS — Z6828 Body mass index (BMI) 28.0-28.9, adult: Secondary | ICD-10-CM | POA: Diagnosis not present

## 2015-02-03 DIAGNOSIS — I10 Essential (primary) hypertension: Secondary | ICD-10-CM | POA: Diagnosis not present

## 2015-02-03 DIAGNOSIS — Z23 Encounter for immunization: Secondary | ICD-10-CM | POA: Diagnosis not present

## 2015-02-03 DIAGNOSIS — I2510 Atherosclerotic heart disease of native coronary artery without angina pectoris: Secondary | ICD-10-CM | POA: Diagnosis not present

## 2015-02-03 DIAGNOSIS — E669 Obesity, unspecified: Secondary | ICD-10-CM | POA: Diagnosis not present

## 2015-02-03 DIAGNOSIS — E782 Mixed hyperlipidemia: Secondary | ICD-10-CM | POA: Diagnosis not present

## 2015-02-03 DIAGNOSIS — E1129 Type 2 diabetes mellitus with other diabetic kidney complication: Secondary | ICD-10-CM | POA: Diagnosis not present

## 2015-02-12 DIAGNOSIS — M5416 Radiculopathy, lumbar region: Secondary | ICD-10-CM | POA: Diagnosis not present

## 2015-02-12 DIAGNOSIS — M4806 Spinal stenosis, lumbar region: Secondary | ICD-10-CM | POA: Diagnosis not present

## 2015-02-12 DIAGNOSIS — M545 Low back pain: Secondary | ICD-10-CM | POA: Diagnosis not present

## 2015-02-16 DIAGNOSIS — M545 Low back pain: Secondary | ICD-10-CM | POA: Diagnosis not present

## 2015-02-16 DIAGNOSIS — M4806 Spinal stenosis, lumbar region: Secondary | ICD-10-CM | POA: Diagnosis not present

## 2015-02-16 DIAGNOSIS — M5416 Radiculopathy, lumbar region: Secondary | ICD-10-CM | POA: Diagnosis not present

## 2015-02-17 DIAGNOSIS — I10 Essential (primary) hypertension: Secondary | ICD-10-CM | POA: Diagnosis not present

## 2015-02-17 DIAGNOSIS — M4806 Spinal stenosis, lumbar region: Secondary | ICD-10-CM | POA: Diagnosis not present

## 2015-02-17 DIAGNOSIS — M4316 Spondylolisthesis, lumbar region: Secondary | ICD-10-CM | POA: Diagnosis not present

## 2015-02-17 DIAGNOSIS — Z6827 Body mass index (BMI) 27.0-27.9, adult: Secondary | ICD-10-CM | POA: Diagnosis not present

## 2015-03-06 ENCOUNTER — Encounter: Payer: Self-pay | Admitting: Cardiovascular Disease

## 2015-03-06 ENCOUNTER — Ambulatory Visit (INDEPENDENT_AMBULATORY_CARE_PROVIDER_SITE_OTHER): Payer: Medicare Other | Admitting: Cardiovascular Disease

## 2015-03-06 VITALS — BP 122/62 | HR 61 | Ht 69.0 in | Wt 189.0 lb

## 2015-03-06 DIAGNOSIS — I10 Essential (primary) hypertension: Secondary | ICD-10-CM

## 2015-03-06 DIAGNOSIS — I2510 Atherosclerotic heart disease of native coronary artery without angina pectoris: Secondary | ICD-10-CM | POA: Diagnosis not present

## 2015-03-06 DIAGNOSIS — E785 Hyperlipidemia, unspecified: Secondary | ICD-10-CM

## 2015-03-06 NOTE — Patient Instructions (Signed)

## 2015-03-06 NOTE — Progress Notes (Signed)
Cardiology Office Note Date:  03/08/2015   ID:  [name], DOB 03-Sep-1934, MRN [id number]  PCP:  Marton Redwood, MD  Cardiologist:  Sherren Mocha, MD    Chief Complaint  Patient presents with  . Coronary Artery Disease     History of Present Illness: [name] is a 80 y.o. male who presents for cardiac followup. He has coronary artery disease and underwent coronary bypass surgery in 2008 after presenting with unstable angina. He had severe multivessel disease and underwent LIMA to LAD grafting, sequential saphenous vein graft 2 first, second, and third OM's, and sequential saphenous vein grafting to the mid right coronary artery and right PDA. He's had no recurrent ischemic events and his last stress test was in 2011 when he walked 8 minutes with no significant ST or T wave changes. His lipids and hemoglobin A1c are followed by Dr. Brigitte Pulse.  Biggest problem at present is low back pain. He's seen multiple providers, has undergone injections, and continues to have problems. Considering microdiskectomy with Dr Ronnald Ramp.   From a cardiac perspective, he is doing fine. He is getting ready for a trip to [place] in [place]. Today, he denies symptoms of palpitations, chest pain, shortness of breath, orthopnea, PND, lower extremity edema, dizziness, or syncope.   Past Medical History  Diagnosis Date  . CAD (coronary artery disease) January 2008    LIMA to LAD, sequential SVG to first, second, and third OM's, sequential SVG to mid RCA and PDA   . HTN (hypertension)   . Hyperlipidemia   . Acute renal failure ([place])     secondary to nephrotic syndrome post cath, resolved with steroids  . Diabetes mellitus without complication [facility])     Past Surgical History  Procedure Laterality Date  . Umbilical hernia repair    . Coronary artery bypass graft  2008    VESSELS X6    Current Outpatient Prescriptions  Medication Sig Dispense Refill  . aspirin 325 MG tablet Take 325 mg by  mouth daily.    Marland Kitchen atorvastatin (LIPITOR) 40 MG tablet Take 40 mg by mouth daily.    . carvedilol (COREG) 12.5 MG tablet Take 12.5 mg by mouth 2 (two) times daily with a meal.    . CIALIS 5 MG tablet Take 1 tablet by mouth every evening.  5  . furosemide (LASIX) 20 MG tablet Take one tablet by mouth    . JARDIANCE 25 MG TABS tablet Take 1 tablet by mouth every evening.  10  . losartan (COZAAR) 100 MG tablet Take 100 mg by mouth daily.    . Saxagliptin-Metformin (KOMBIGLYZE XR) 2.08-998 MG TB24 Take 1,000 mg by mouth 2 (two) times daily.     . vitamin E 100 UNIT capsule Take 100 Units by mouth daily.    Marland Kitchen ZETIA 10 MG tablet Take 1 tablet by mouth every morning.     No current facility-administered medications for this visit.    Allergies:   Review of patient's allergies indicates no known allergies.   Social History:  The patient  reports that he has never smoked. He has never used smokeless tobacco. He reports that he does not use illicit drugs.   Family History:  The patient's  family history includes Clotting disorder in his father; Heart attack in his mother; Heart disease in his sister. There is no history of Colon cancer.    ROS:  Please see the history of present illness.  Otherwise, review of systems is positive  for appetite change, rash, leg pain, fatigue, constipation.  All other systems are reviewed and negative.    PHYSICAL EXAM: VS:  BP 122/62 mmHg  Pulse 61  Ht 5\' 9"  (1.753 m)  Wt 189 lb (85.73 kg)  BMI 27.90 kg/m2 , BMI Body mass index is 27.9 kg/(m^2). GEN: Well nourished, well developed, pleasant elderly male in no acute distress HEENT: normal Neck: no JVD, no masses. No carotid bruits Cardiac: RRR without murmur or gallop                Respiratory:  clear to auscultation bilaterally, normal work of breathing GI: soft, nontender, nondistended, + BS MS: no deformity or atrophy Ext: no pretibial edema, pedal pulses 2+= bilaterally Skin: warm and dry, no  rash Neuro:  Strength and sensation are intact Psych: euthymic mood, full affect  EKG:  EKG is ordered today. The ekg ordered today shows normal sinus rhythm with first-degree AV block, heart rate 61 bpm, age indeterminate inferior MI, moderate voltage criteria for LVH maybe normal variant. No significant change from last year's tracing.  Recent Labs: No results found for requested labs within last 365 days.   Lipid Panel     Component Value Date/Time   CHOL 128 09/20/2007 0826   TRIG 67 09/20/2007 0826   HDL 33.0* 09/20/2007 0826   CHOLHDL 3.9 CALC 09/20/2007 0826   VLDL 13 09/20/2007 0826   LDLCALC 82 09/20/2007 0826      Wt Readings from Last 3 Encounters:  03/06/15 189 lb (85.73 kg)  09/18/13 196 lb (88.905 kg)  03/26/13 204 lb (92.534 kg)     ASSESSMENT AND PLAN: 1.  CAD, native vessel, without symptoms of angina: The patient is status post CABG. He continues on aspirin, atorvastatin, carvedilol, and losartan. He appears to be doing very well on his medical program without cardiac symptoms. I will see him back in one year.  2. Essential hypertension: Blood pressure is well controlled on multidrug therapy.  3. Hyperlipidemia: The patient takes atorvastatin. He is followed by Dr. Brigitte Pulse.  Current medicines are reviewed with the patient today.  The patient does not have concerns regarding medicines.  Labs/ tests ordered today include:   Orders Placed This Encounter  Procedures  . EKG 12-Lead    Disposition:   FU one year  Signed, Sherren Mocha, MD  03/08/2015 10:20 PM    [facility] [address], [place], [place]  [address] Phone: [phone]; Fax: [phone]

## 2015-03-09 DIAGNOSIS — M4806 Spinal stenosis, lumbar region: Secondary | ICD-10-CM | POA: Diagnosis not present

## 2015-03-09 DIAGNOSIS — Z6828 Body mass index (BMI) 28.0-28.9, adult: Secondary | ICD-10-CM | POA: Diagnosis not present

## 2015-03-09 DIAGNOSIS — I10 Essential (primary) hypertension: Secondary | ICD-10-CM | POA: Diagnosis not present

## 2015-03-09 DIAGNOSIS — M4316 Spondylolisthesis, lumbar region: Secondary | ICD-10-CM | POA: Diagnosis not present

## 2015-03-09 DIAGNOSIS — M545 Low back pain: Secondary | ICD-10-CM | POA: Diagnosis not present

## 2015-04-23 DIAGNOSIS — H10412 Chronic giant papillary conjunctivitis, left eye: Secondary | ICD-10-CM | POA: Diagnosis not present

## 2015-05-19 DIAGNOSIS — Z6828 Body mass index (BMI) 28.0-28.9, adult: Secondary | ICD-10-CM | POA: Diagnosis not present

## 2015-05-19 DIAGNOSIS — R51 Headache: Secondary | ICD-10-CM | POA: Diagnosis not present

## 2015-05-19 DIAGNOSIS — E1129 Type 2 diabetes mellitus with other diabetic kidney complication: Secondary | ICD-10-CM | POA: Diagnosis not present

## 2015-07-10 DIAGNOSIS — E782 Mixed hyperlipidemia: Secondary | ICD-10-CM | POA: Diagnosis not present

## 2015-07-10 DIAGNOSIS — I10 Essential (primary) hypertension: Secondary | ICD-10-CM | POA: Diagnosis not present

## 2015-07-10 DIAGNOSIS — E1129 Type 2 diabetes mellitus with other diabetic kidney complication: Secondary | ICD-10-CM | POA: Diagnosis not present

## 2015-07-17 DIAGNOSIS — R5383 Other fatigue: Secondary | ICD-10-CM | POA: Diagnosis not present

## 2015-07-17 DIAGNOSIS — R809 Proteinuria, unspecified: Secondary | ICD-10-CM | POA: Diagnosis not present

## 2015-07-17 DIAGNOSIS — Z0000 Encounter for general adult medical examination without abnormal findings: Secondary | ICD-10-CM | POA: Diagnosis not present

## 2015-07-17 DIAGNOSIS — N1831 Chronic kidney disease, stage 3a: Secondary | ICD-10-CM | POA: Insufficient documentation

## 2015-07-17 DIAGNOSIS — I10 Essential (primary) hypertension: Secondary | ICD-10-CM | POA: Diagnosis not present

## 2015-07-17 DIAGNOSIS — Z6829 Body mass index (BMI) 29.0-29.9, adult: Secondary | ICD-10-CM | POA: Diagnosis not present

## 2015-07-17 DIAGNOSIS — Z1389 Encounter for screening for other disorder: Secondary | ICD-10-CM | POA: Diagnosis not present

## 2015-07-17 DIAGNOSIS — E669 Obesity, unspecified: Secondary | ICD-10-CM | POA: Diagnosis not present

## 2015-07-17 DIAGNOSIS — N183 Chronic kidney disease, stage 3 (moderate): Secondary | ICD-10-CM | POA: Diagnosis not present

## 2015-07-17 DIAGNOSIS — I25810 Atherosclerosis of coronary artery bypass graft(s) without angina pectoris: Secondary | ICD-10-CM | POA: Diagnosis not present

## 2015-07-17 DIAGNOSIS — E1129 Type 2 diabetes mellitus with other diabetic kidney complication: Secondary | ICD-10-CM | POA: Diagnosis not present

## 2015-07-17 DIAGNOSIS — E782 Mixed hyperlipidemia: Secondary | ICD-10-CM | POA: Diagnosis not present

## 2015-09-16 DIAGNOSIS — L820 Inflamed seborrheic keratosis: Secondary | ICD-10-CM | POA: Diagnosis not present

## 2015-09-16 DIAGNOSIS — D692 Other nonthrombocytopenic purpura: Secondary | ICD-10-CM | POA: Diagnosis not present

## 2015-10-05 DIAGNOSIS — H43393 Other vitreous opacities, bilateral: Secondary | ICD-10-CM | POA: Diagnosis not present

## 2015-10-05 DIAGNOSIS — H5213 Myopia, bilateral: Secondary | ICD-10-CM | POA: Diagnosis not present

## 2015-10-05 DIAGNOSIS — H25811 Combined forms of age-related cataract, right eye: Secondary | ICD-10-CM | POA: Diagnosis not present

## 2015-10-05 DIAGNOSIS — Z7984 Long term (current) use of oral hypoglycemic drugs: Secondary | ICD-10-CM | POA: Diagnosis not present

## 2015-10-05 DIAGNOSIS — H52223 Regular astigmatism, bilateral: Secondary | ICD-10-CM | POA: Diagnosis not present

## 2015-10-05 DIAGNOSIS — E119 Type 2 diabetes mellitus without complications: Secondary | ICD-10-CM | POA: Diagnosis not present

## 2015-10-05 DIAGNOSIS — Z961 Presence of intraocular lens: Secondary | ICD-10-CM | POA: Diagnosis not present

## 2015-10-07 DIAGNOSIS — R809 Proteinuria, unspecified: Secondary | ICD-10-CM | POA: Diagnosis not present

## 2015-10-07 DIAGNOSIS — I25810 Atherosclerosis of coronary artery bypass graft(s) without angina pectoris: Secondary | ICD-10-CM | POA: Diagnosis not present

## 2015-10-07 DIAGNOSIS — E668 Other obesity: Secondary | ICD-10-CM | POA: Diagnosis not present

## 2015-10-07 DIAGNOSIS — Z6828 Body mass index (BMI) 28.0-28.9, adult: Secondary | ICD-10-CM | POA: Diagnosis not present

## 2015-10-07 DIAGNOSIS — I10 Essential (primary) hypertension: Secondary | ICD-10-CM | POA: Diagnosis not present

## 2015-10-07 DIAGNOSIS — E1129 Type 2 diabetes mellitus with other diabetic kidney complication: Secondary | ICD-10-CM | POA: Diagnosis not present

## 2015-10-07 DIAGNOSIS — E782 Mixed hyperlipidemia: Secondary | ICD-10-CM | POA: Diagnosis not present

## 2015-10-07 DIAGNOSIS — N183 Chronic kidney disease, stage 3 (moderate): Secondary | ICD-10-CM | POA: Diagnosis not present

## 2015-10-29 DIAGNOSIS — N3941 Urge incontinence: Secondary | ICD-10-CM | POA: Diagnosis not present

## 2015-10-29 DIAGNOSIS — H10413 Chronic giant papillary conjunctivitis, bilateral: Secondary | ICD-10-CM | POA: Diagnosis not present

## 2015-10-29 DIAGNOSIS — N401 Enlarged prostate with lower urinary tract symptoms: Secondary | ICD-10-CM | POA: Diagnosis not present

## 2015-11-24 DIAGNOSIS — H2511 Age-related nuclear cataract, right eye: Secondary | ICD-10-CM | POA: Diagnosis not present

## 2015-11-24 DIAGNOSIS — Z961 Presence of intraocular lens: Secondary | ICD-10-CM | POA: Diagnosis not present

## 2015-11-24 DIAGNOSIS — H18411 Arcus senilis, right eye: Secondary | ICD-10-CM | POA: Diagnosis not present

## 2015-11-24 DIAGNOSIS — H18412 Arcus senilis, left eye: Secondary | ICD-10-CM | POA: Diagnosis not present

## 2015-11-24 DIAGNOSIS — H353130 Nonexudative age-related macular degeneration, bilateral, stage unspecified: Secondary | ICD-10-CM | POA: Diagnosis not present

## 2015-11-30 DIAGNOSIS — N3941 Urge incontinence: Secondary | ICD-10-CM | POA: Diagnosis not present

## 2015-11-30 DIAGNOSIS — N201 Calculus of ureter: Secondary | ICD-10-CM | POA: Diagnosis not present

## 2015-12-01 DIAGNOSIS — H3562 Retinal hemorrhage, left eye: Secondary | ICD-10-CM | POA: Diagnosis not present

## 2015-12-01 DIAGNOSIS — H35371 Puckering of macula, right eye: Secondary | ICD-10-CM | POA: Diagnosis not present

## 2015-12-01 DIAGNOSIS — H35363 Drusen (degenerative) of macula, bilateral: Secondary | ICD-10-CM | POA: Diagnosis not present

## 2015-12-01 DIAGNOSIS — H35043 Retinal micro-aneurysms, unspecified, bilateral: Secondary | ICD-10-CM | POA: Diagnosis not present

## 2015-12-31 ENCOUNTER — Encounter: Payer: Self-pay | Admitting: Internal Medicine

## 2016-01-11 DIAGNOSIS — H2511 Age-related nuclear cataract, right eye: Secondary | ICD-10-CM | POA: Diagnosis not present

## 2016-02-17 DIAGNOSIS — H35363 Drusen (degenerative) of macula, bilateral: Secondary | ICD-10-CM | POA: Diagnosis not present

## 2016-02-17 DIAGNOSIS — H35361 Drusen (degenerative) of macula, right eye: Secondary | ICD-10-CM | POA: Diagnosis not present

## 2016-02-17 DIAGNOSIS — H35371 Puckering of macula, right eye: Secondary | ICD-10-CM | POA: Diagnosis not present

## 2016-02-17 DIAGNOSIS — H3562 Retinal hemorrhage, left eye: Secondary | ICD-10-CM | POA: Diagnosis not present

## 2016-02-19 DIAGNOSIS — N3941 Urge incontinence: Secondary | ICD-10-CM | POA: Diagnosis not present

## 2016-02-19 DIAGNOSIS — N401 Enlarged prostate with lower urinary tract symptoms: Secondary | ICD-10-CM | POA: Diagnosis not present

## 2016-02-22 DIAGNOSIS — Z23 Encounter for immunization: Secondary | ICD-10-CM | POA: Diagnosis not present

## 2016-03-02 DIAGNOSIS — C44311 Basal cell carcinoma of skin of nose: Secondary | ICD-10-CM | POA: Diagnosis not present

## 2016-03-02 DIAGNOSIS — L821 Other seborrheic keratosis: Secondary | ICD-10-CM | POA: Diagnosis not present

## 2016-03-02 DIAGNOSIS — C44319 Basal cell carcinoma of skin of other parts of face: Secondary | ICD-10-CM | POA: Diagnosis not present

## 2016-03-07 DIAGNOSIS — I10 Essential (primary) hypertension: Secondary | ICD-10-CM | POA: Diagnosis not present

## 2016-03-07 DIAGNOSIS — E1129 Type 2 diabetes mellitus with other diabetic kidney complication: Secondary | ICD-10-CM | POA: Diagnosis not present

## 2016-03-07 DIAGNOSIS — I25810 Atherosclerosis of coronary artery bypass graft(s) without angina pectoris: Secondary | ICD-10-CM | POA: Diagnosis not present

## 2016-03-07 DIAGNOSIS — E782 Mixed hyperlipidemia: Secondary | ICD-10-CM | POA: Diagnosis not present

## 2016-03-07 DIAGNOSIS — Z6828 Body mass index (BMI) 28.0-28.9, adult: Secondary | ICD-10-CM | POA: Diagnosis not present

## 2016-03-07 DIAGNOSIS — N401 Enlarged prostate with lower urinary tract symptoms: Secondary | ICD-10-CM | POA: Diagnosis not present

## 2016-03-28 DIAGNOSIS — C44119 Basal cell carcinoma of skin of left eyelid, including canthus: Secondary | ICD-10-CM | POA: Diagnosis not present

## 2016-03-28 DIAGNOSIS — Z85828 Personal history of other malignant neoplasm of skin: Secondary | ICD-10-CM | POA: Diagnosis not present

## 2016-03-31 ENCOUNTER — Encounter: Payer: Self-pay | Admitting: Internal Medicine

## 2016-04-01 DIAGNOSIS — N401 Enlarged prostate with lower urinary tract symptoms: Secondary | ICD-10-CM | POA: Diagnosis not present

## 2016-04-01 DIAGNOSIS — N3941 Urge incontinence: Secondary | ICD-10-CM | POA: Diagnosis not present

## 2016-04-25 DIAGNOSIS — M4316 Spondylolisthesis, lumbar region: Secondary | ICD-10-CM | POA: Diagnosis not present

## 2016-04-28 DIAGNOSIS — M48062 Spinal stenosis, lumbar region with neurogenic claudication: Secondary | ICD-10-CM | POA: Diagnosis not present

## 2016-04-28 DIAGNOSIS — M545 Low back pain: Secondary | ICD-10-CM | POA: Diagnosis not present

## 2016-04-28 DIAGNOSIS — M4316 Spondylolisthesis, lumbar region: Secondary | ICD-10-CM | POA: Diagnosis not present

## 2016-05-24 DIAGNOSIS — R509 Fever, unspecified: Secondary | ICD-10-CM | POA: Diagnosis not present

## 2016-05-30 ENCOUNTER — Ambulatory Visit: Payer: Medicare Other | Admitting: Internal Medicine

## 2016-07-19 DIAGNOSIS — E1129 Type 2 diabetes mellitus with other diabetic kidney complication: Secondary | ICD-10-CM | POA: Diagnosis not present

## 2016-07-19 DIAGNOSIS — I10 Essential (primary) hypertension: Secondary | ICD-10-CM | POA: Diagnosis not present

## 2016-07-26 ENCOUNTER — Telehealth: Payer: Self-pay | Admitting: *Deleted

## 2016-07-26 DIAGNOSIS — N401 Enlarged prostate with lower urinary tract symptoms: Secondary | ICD-10-CM | POA: Diagnosis not present

## 2016-07-26 DIAGNOSIS — Z6828 Body mass index (BMI) 28.0-28.9, adult: Secondary | ICD-10-CM | POA: Diagnosis not present

## 2016-07-26 DIAGNOSIS — I10 Essential (primary) hypertension: Secondary | ICD-10-CM | POA: Diagnosis not present

## 2016-07-26 DIAGNOSIS — E1129 Type 2 diabetes mellitus with other diabetic kidney complication: Secondary | ICD-10-CM | POA: Diagnosis not present

## 2016-07-26 DIAGNOSIS — E669 Obesity, unspecified: Secondary | ICD-10-CM | POA: Diagnosis not present

## 2016-07-26 DIAGNOSIS — E782 Mixed hyperlipidemia: Secondary | ICD-10-CM | POA: Diagnosis not present

## 2016-07-26 DIAGNOSIS — Z1389 Encounter for screening for other disorder: Secondary | ICD-10-CM | POA: Diagnosis not present

## 2016-07-26 DIAGNOSIS — Z86010 Personal history of colonic polyps: Secondary | ICD-10-CM | POA: Diagnosis not present

## 2016-07-26 DIAGNOSIS — Z0000 Encounter for general adult medical examination without abnormal findings: Secondary | ICD-10-CM | POA: Diagnosis not present

## 2016-07-26 DIAGNOSIS — R809 Proteinuria, unspecified: Secondary | ICD-10-CM | POA: Diagnosis not present

## 2016-07-26 DIAGNOSIS — I25810 Atherosclerosis of coronary artery bypass graft(s) without angina pectoris: Secondary | ICD-10-CM | POA: Diagnosis not present

## 2016-07-26 DIAGNOSIS — N183 Chronic kidney disease, stage 3 (moderate): Secondary | ICD-10-CM | POA: Diagnosis not present

## 2016-07-26 NOTE — Telephone Encounter (Signed)
NOTES SENT TO SCHEDULING.  °

## 2016-07-29 ENCOUNTER — Ambulatory Visit (INDEPENDENT_AMBULATORY_CARE_PROVIDER_SITE_OTHER): Payer: Medicare Other | Admitting: Cardiovascular Disease

## 2016-07-29 ENCOUNTER — Encounter: Payer: Self-pay | Admitting: Cardiovascular Disease

## 2016-07-29 VITALS — BP 158/68 | HR 66 | Ht 69.0 in | Wt 188.8 lb

## 2016-07-29 DIAGNOSIS — E785 Hyperlipidemia, unspecified: Secondary | ICD-10-CM

## 2016-07-29 DIAGNOSIS — I2510 Atherosclerotic heart disease of native coronary artery without angina pectoris: Secondary | ICD-10-CM

## 2016-07-29 DIAGNOSIS — I10 Essential (primary) hypertension: Secondary | ICD-10-CM | POA: Diagnosis not present

## 2016-07-29 NOTE — Patient Instructions (Signed)

## 2016-07-29 NOTE — Progress Notes (Signed)
Cardiology Office Note Date:  07/29/2016   ID:  ESHAWN COOR, DOB October 16, 1934, MRN [id number]  PCP:  Marton Redwood, MD  Cardiologist:  Sherren Mocha, MD    Chief Complaint  Patient presents with  . Follow-up     History of Present Illness: [name] is a 82 y.o. male who presents for  cardiac followup. He has coronary artery disease and underwent coronary bypass surgery in 2008 after presenting with unstable angina. He had severe multivessel disease and underwent LIMA to LAD grafting, sequential saphenous vein graft 2 first, second, and third OM's, and sequential saphenous vein grafting to the mid right coronary artery and right PDA.   The patient is here alone today. He has been doing well. He hasn't been doing a lot of walking, but he does stay active. He denies any exertional chest pain or shortness of breath. He denies edema, heart palpitations, orthopnea, or PND. He remembers back to the time of his CABG and he did have exertional angina leading up to the CABG. His symptoms have not returned.  Past Medical History:  Diagnosis Date  . Acute renal failure ([place])    secondary to nephrotic syndrome post cath, resolved with steroids  . CAD (coronary artery disease) January 2008   LIMA to LAD, sequential SVG to first, second, and third OM's, sequential SVG to mid RCA and PDA   . Diabetes mellitus without complication ([place])   . HTN (hypertension)   . Hx of adenomatous colonic polyps   . Hyperlipidemia     Past Surgical History:  Procedure Laterality Date  . COLONOSCOPY W/ BIOPSIES AND POLYPECTOMY    . CORONARY ARTERY BYPASS GRAFT  2008   VESSELS X6  . UMBILICAL HERNIA REPAIR      Current Outpatient Prescriptions  Medication Sig Dispense Refill  . aspirin 325 MG tablet Take 325 mg by mouth daily.    Marland Kitchen atorvastatin (LIPITOR) 40 MG tablet Take 40 mg by mouth daily.    . carvedilol (COREG) 12.5 MG tablet Take 12.5 mg by mouth 2 (two) times daily with a meal.    .  CIALIS 5 MG tablet Take 1 tablet by mouth every evening.  5  . furosemide (LASIX) 20 MG tablet Take one tablet by mouth    . JARDIANCE 25 MG TABS tablet Take 1 tablet by mouth every evening.  10  . losartan (COZAAR) 100 MG tablet Take 100 mg by mouth daily.    . Saxagliptin-Metformin (KOMBIGLYZE XR) 2.08-998 MG TB24 Take 1,000 mg by mouth 2 (two) times daily.     . vitamin E 100 UNIT capsule Take 100 Units by mouth daily.    Marland Kitchen ZETIA 10 MG tablet Take 1 tablet by mouth every morning.     No current facility-administered medications for this visit.     Allergies:   Patient has no known allergies.   Social History:  The patient  reports that he has never smoked. He has never used smokeless tobacco. He reports that he does not drink alcohol or use drugs.   Family History:  The patient's  family history includes Clotting disorder in his father; Heart attack in his mother; Heart disease in his sister.    ROS:  Please see the history of present illness.  Otherwise, review of systems is positive for easy bruising.  All other systems are reviewed and negative.    PHYSICAL EXAM: VS:  BP (!) 158/68   Pulse 66   Ht 5'  9" (1.753 m)   Wt 188 lb 12.8 oz (85.6 kg)   BMI 27.88 kg/m  , BMI Body mass index is 27.88 kg/m. GEN: Well nourished, well developed, in no acute distress  HEENT: normal  Neck: no JVD, no masses. No carotid bruits Cardiac: RRR without murmur or gallop                Respiratory:  clear to auscultation bilaterally, normal work of breathing GI: soft, nontender, nondistended, + BS MS: no deformity or atrophy  Ext: no pretibial edema, pedal pulses 2+= bilaterally Skin: warm and dry, no rash Neuro:  Strength and sensation are intact Psych: euthymic mood, full affect  EKG:  EKG is ordered today. The ekg ordered today shows normal sinus rhythm 66 bpm, PACs, minimal voltage criteria for LVH, age indeterminate inferior infarct. No change from tracing 03/06/2015.  Recent  Labs: No results found for requested labs within last 8760 hours.   Lipid Panel     Component Value Date/Time   CHOL 128 09/20/2007 0826   TRIG 67 09/20/2007 0826   HDL 33.0 (L) 09/20/2007 0826   CHOLHDL 3.9 CALC 09/20/2007 0826   VLDL 13 09/20/2007 0826   LDLCALC 82 09/20/2007 0826      Wt Readings from Last 3 Encounters:  07/29/16 188 lb 12.8 oz (85.6 kg)  03/06/15 189 lb (85.7 kg)  09/18/13 196 lb (88.9 kg)     ASSESSMENT AND PLAN: 1.  CAD, native vessel, without angina:The patient continues to do well now 10 years out CABG. He has no recurrent anginal symptoms. His medical program is reviewed and he continued without change.  2. HTN: Repeat blood pressure 122/72. Recent blood pressure at Dr. Raul Del office is 112/68. Continue same medications  3. Hyperlipidemia: Lab work is reviewed. April lipoprotein B is 59, cholesterol 111, HDL 33, LDL 55, triglycerides 115  Current medicines are reviewed with the patient today.  The patient does not have concerns regarding medicines.  Labs/ tests ordered today include:   Orders Placed This Encounter  Procedures  . EKG 12-Lead    Disposition:   FU one year  Signed, Sherren Mocha, MD  07/29/2016 5:47 PM    [facility], [place], [place]  [address] Phone: [phone]; Fax: [phone]

## 2016-09-08 DIAGNOSIS — E119 Type 2 diabetes mellitus without complications: Secondary | ICD-10-CM | POA: Diagnosis not present

## 2016-09-08 DIAGNOSIS — H5212 Myopia, left eye: Secondary | ICD-10-CM | POA: Diagnosis not present

## 2016-09-08 DIAGNOSIS — H52222 Regular astigmatism, left eye: Secondary | ICD-10-CM | POA: Diagnosis not present

## 2016-09-08 DIAGNOSIS — Z961 Presence of intraocular lens: Secondary | ICD-10-CM | POA: Diagnosis not present

## 2016-09-08 DIAGNOSIS — Z7984 Long term (current) use of oral hypoglycemic drugs: Secondary | ICD-10-CM | POA: Diagnosis not present

## 2016-09-08 DIAGNOSIS — H26491 Other secondary cataract, right eye: Secondary | ICD-10-CM | POA: Diagnosis not present

## 2016-10-20 DIAGNOSIS — E1129 Type 2 diabetes mellitus with other diabetic kidney complication: Secondary | ICD-10-CM | POA: Diagnosis not present

## 2016-10-20 DIAGNOSIS — E782 Mixed hyperlipidemia: Secondary | ICD-10-CM | POA: Diagnosis not present

## 2016-10-20 DIAGNOSIS — M791 Myalgia: Secondary | ICD-10-CM | POA: Diagnosis not present

## 2016-10-20 DIAGNOSIS — R229 Localized swelling, mass and lump, unspecified: Secondary | ICD-10-CM | POA: Diagnosis not present

## 2016-10-20 DIAGNOSIS — Z6828 Body mass index (BMI) 28.0-28.9, adult: Secondary | ICD-10-CM | POA: Diagnosis not present

## 2017-01-27 DIAGNOSIS — Z23 Encounter for immunization: Secondary | ICD-10-CM | POA: Diagnosis not present

## 2017-01-27 DIAGNOSIS — E668 Other obesity: Secondary | ICD-10-CM | POA: Diagnosis not present

## 2017-01-27 DIAGNOSIS — I10 Essential (primary) hypertension: Secondary | ICD-10-CM | POA: Diagnosis not present

## 2017-01-27 DIAGNOSIS — I25810 Atherosclerosis of coronary artery bypass graft(s) without angina pectoris: Secondary | ICD-10-CM | POA: Diagnosis not present

## 2017-01-27 DIAGNOSIS — E1129 Type 2 diabetes mellitus with other diabetic kidney complication: Secondary | ICD-10-CM | POA: Diagnosis not present

## 2017-01-27 DIAGNOSIS — N183 Chronic kidney disease, stage 3 (moderate): Secondary | ICD-10-CM | POA: Diagnosis not present

## 2017-01-27 DIAGNOSIS — E782 Mixed hyperlipidemia: Secondary | ICD-10-CM | POA: Diagnosis not present

## 2017-01-27 DIAGNOSIS — F3289 Other specified depressive episodes: Secondary | ICD-10-CM | POA: Diagnosis not present

## 2017-01-27 DIAGNOSIS — Z6828 Body mass index (BMI) 28.0-28.9, adult: Secondary | ICD-10-CM | POA: Diagnosis not present

## 2017-01-27 DIAGNOSIS — F3340 Major depressive disorder, recurrent, in remission, unspecified: Secondary | ICD-10-CM | POA: Insufficient documentation

## 2017-02-07 ENCOUNTER — Other Ambulatory Visit: Payer: Self-pay | Admitting: Dermatology

## 2017-02-07 DIAGNOSIS — C44219 Basal cell carcinoma of skin of left ear and external auricular canal: Secondary | ICD-10-CM | POA: Diagnosis not present

## 2017-02-20 DIAGNOSIS — H35371 Puckering of macula, right eye: Secondary | ICD-10-CM | POA: Diagnosis not present

## 2017-02-20 DIAGNOSIS — H35361 Drusen (degenerative) of macula, right eye: Secondary | ICD-10-CM | POA: Diagnosis not present

## 2017-02-20 DIAGNOSIS — H3562 Retinal hemorrhage, left eye: Secondary | ICD-10-CM | POA: Diagnosis not present

## 2017-02-20 DIAGNOSIS — H43811 Vitreous degeneration, right eye: Secondary | ICD-10-CM | POA: Diagnosis not present

## 2017-03-11 ENCOUNTER — Emergency Department (HOSPITAL_COMMUNITY): Payer: Medicare Other

## 2017-03-11 ENCOUNTER — Emergency Department (HOSPITAL_COMMUNITY)
Admission: EM | Admit: 2017-03-11 | Discharge: 2017-03-11 | Disposition: A | Discharge status: Home / Self Care | Payer: Medicare Other | Attending: Emergency Medicine | Admitting: Emergency Medicine

## 2017-03-11 ENCOUNTER — Encounter (HOSPITAL_COMMUNITY): Payer: Self-pay | Admitting: Nurse Practitioner

## 2017-03-11 DIAGNOSIS — R109 Unspecified abdominal pain: Secondary | ICD-10-CM | POA: Diagnosis not present

## 2017-03-11 DIAGNOSIS — S0990XA Unspecified injury of head, initial encounter: Secondary | ICD-10-CM | POA: Diagnosis not present

## 2017-03-11 DIAGNOSIS — Z7982 Long term (current) use of aspirin: Secondary | ICD-10-CM | POA: Insufficient documentation

## 2017-03-11 DIAGNOSIS — S0101XA Laceration without foreign body of scalp, initial encounter: Secondary | ICD-10-CM | POA: Diagnosis not present

## 2017-03-11 DIAGNOSIS — I10 Essential (primary) hypertension: Secondary | ICD-10-CM | POA: Diagnosis not present

## 2017-03-11 DIAGNOSIS — Y999 Unspecified external cause status: Secondary | ICD-10-CM | POA: Diagnosis not present

## 2017-03-11 DIAGNOSIS — Z7984 Long term (current) use of oral hypoglycemic drugs: Secondary | ICD-10-CM | POA: Insufficient documentation

## 2017-03-11 DIAGNOSIS — W1830XA Fall on same level, unspecified, initial encounter: Secondary | ICD-10-CM | POA: Insufficient documentation

## 2017-03-11 DIAGNOSIS — I2510 Atherosclerotic heart disease of native coronary artery without angina pectoris: Secondary | ICD-10-CM | POA: Insufficient documentation

## 2017-03-11 DIAGNOSIS — R0781 Pleurodynia: Secondary | ICD-10-CM | POA: Diagnosis not present

## 2017-03-11 DIAGNOSIS — S299XXA Unspecified injury of thorax, initial encounter: Secondary | ICD-10-CM | POA: Diagnosis not present

## 2017-03-11 DIAGNOSIS — E119 Type 2 diabetes mellitus without complications: Secondary | ICD-10-CM | POA: Insufficient documentation

## 2017-03-11 DIAGNOSIS — Z951 Presence of aortocoronary bypass graft: Secondary | ICD-10-CM | POA: Insufficient documentation

## 2017-03-11 DIAGNOSIS — Y929 Unspecified place or not applicable: Secondary | ICD-10-CM | POA: Insufficient documentation

## 2017-03-11 DIAGNOSIS — Z79899 Other long term (current) drug therapy: Secondary | ICD-10-CM | POA: Diagnosis not present

## 2017-03-11 DIAGNOSIS — Y93E9 Activity, other interior property and clothing maintenance: Secondary | ICD-10-CM | POA: Diagnosis not present

## 2017-03-11 MED ORDER — LIDOCAINE-EPINEPHRINE (PF) 2 %-1:200000 IJ SOLN
20.0000 mL | Freq: Once | INTRAMUSCULAR | Status: AC
  Administered 2017-03-11: 20 mL via INTRADERMAL
  Filled 2017-03-11: qty 20

## 2017-03-11 NOTE — ED Triage Notes (Signed)
Pt presents with an injury on his posterior/occipital aspect of his head that he reports is from a fall as he was pulling on a rope. Denies LOC or being on anticoagulant.

## 2017-03-11 NOTE — ED Provider Notes (Signed)
[facility] DEPT Provider Note   CSN: [id number] Arrival date & time: 03/11/17  1910     History   Chief Complaint Chief Complaint  Patient presents with  . Head Injury  . Flank Pain    HPI [name] is a 82 y.o. male.  81 yo M with a chief complaint of a fall.  The patient was trying to pull a washing machine and by himself and lost his balance and fell backwards.  Struck the back of his head on the ground.  Denies loss of consciousness.  Does take a baby aspirin.  Denies unilateral weakness or numbness.  The patient was able to afterwards the wash machine in and install it before the family noticed that he was bleeding from the back of his head.  He also is complaining of some right-sided chest wall pain.  They then convinced him that he needed to come to the emergency department.   The history is provided by the patient.  Head Injury   The incident occurred 1 to 2 hours ago. He came to the ER via walk-in. The injury mechanism was a direct blow and a fall. There was no loss of consciousness. The volume of blood lost was minimal. The quality of the pain is described as sharp. The pain is at a severity of 4/10. The pain is mild. The pain has been constant since the injury. Pertinent negatives include no vomiting. He has tried nothing for the symptoms. The treatment provided no relief.  Flank Pain  Associated symptoms include headaches. Pertinent negatives include no chest pain, no abdominal pain and no shortness of breath.    Past Medical History:  Diagnosis Date  . Acute renal failure ([place])    secondary to nephrotic syndrome post cath, resolved with steroids  . CAD (coronary artery disease) January 2008   LIMA to LAD, sequential SVG to first, second, and third OM's, sequential SVG to mid RCA and PDA   . Diabetes mellitus without complication ([place])   . HTN (hypertension)   . Hx of adenomatous colonic polyps   . Hyperlipidemia     Patient  Active Problem List   Diagnosis Date Noted  . Personal history of colonic adenomas 03/26/2013  . OBSTRUCTIVE SLEEP APNEA 10/30/2008  . HYPERLIPIDEMIA-MIXED 07/01/2008  . HYPERTENSION, BENIGN 07/01/2008  . CAD, NATIVE VESSEL 07/01/2008  . FATIGUE / MALAISE 07/01/2008  . EDEMA 07/01/2008    Past Surgical History:  Procedure Laterality Date  . COLONOSCOPY W/ BIOPSIES AND POLYPECTOMY    . CORONARY ARTERY BYPASS GRAFT  2008   VESSELS X6  . UMBILICAL HERNIA REPAIR         Home Medications    Prior to Admission medications   Medication Sig Start Date End Date Taking? Authorizing Provider  aspirin 325 MG tablet Take 325 mg by mouth daily.    [provider]  atorvastatin (LIPITOR) 40 MG tablet Take 40 mg by mouth daily.    [provider]  carvedilol (COREG) 12.5 MG tablet Take 12.5 mg by mouth 2 (two) times daily with a meal.    [provider]  CIALIS 5 MG tablet Take 1 tablet by mouth every evening. 02/28/15   [provider]  furosemide (LASIX) 20 MG tablet Take one tablet by mouth 12/21/10   Bensimhon, Shaune Pascal, MD  JARDIANCE 25 MG TABS tablet Take 1 tablet by mouth every evening. 02/28/15   [provider]  losartan (COZAAR) 100 MG  tablet Take 100 mg by mouth daily.    [provider]  Saxagliptin-Metformin (KOMBIGLYZE XR) 2.08-998 MG TB24 Take 1,000 mg by mouth 2 (two) times daily.     [provider]  vitamin E 100 UNIT capsule Take 100 Units by mouth daily.    [provider]  ZETIA 10 MG tablet Take 1 tablet by mouth every morning. 03/04/15   [provider]    Family History Family History  Problem Relation Age of Onset  . Heart attack Mother   . Heart disease Sister   . Clotting disorder Father        died of renal disease  . Colon cancer Neg Hx     Social History Social History   Tobacco Use  . Smoking status: Never Smoker  . Smokeless tobacco: Never Used  Substance Use Topics  .  Alcohol use: No    Alcohol/week: 0.6 oz    Types: 1 Glasses of wine per week  . Drug use: No     Allergies   Patient has no known allergies.   Review of Systems Review of Systems  Constitutional: Negative for chills and fever.  HENT: Negative for congestion and facial swelling.   Eyes: Negative for discharge and visual disturbance.  Respiratory: Negative for shortness of breath.   Cardiovascular: Negative for chest pain and palpitations.  Gastrointestinal: Negative for abdominal pain, diarrhea and vomiting.  Genitourinary: Positive for flank pain.  Musculoskeletal: Negative for arthralgias and myalgias.  Skin: Negative for color change and rash.  Neurological: Positive for headaches. Negative for tremors and syncope.  Psychiatric/Behavioral: Negative for confusion and dysphoric mood.     Physical Exam Updated Vital Signs BP (!) 205/102 (BP Location: Left Arm)   Pulse 70   Temp 98.3 F (36.8 C) (Oral)   Resp 18   Ht 5\' 9"  (1.753 m)   Wt 87 kg (191 lb 12.8 oz)   SpO2 95%   BMI 28.32 kg/m   Physical Exam  Constitutional: He is oriented to person, place, and time. He appears well-developed and well-nourished.  HENT:  Head: Normocephalic and atraumatic.  Hematoma to the left posterior occiput.  0.5 cm laceration.  Eyes: EOM are normal. Pupils are equal, round, and reactive to light.  Neck: Normal range of motion. Neck supple. No JVD present.  Cardiovascular: Normal rate and regular rhythm. Exam reveals no gallop and no friction rub.  No murmur heard. Pulmonary/Chest: No respiratory distress. He has no wheezes.  Abdominal: He exhibits no distension. There is no rebound and no guarding.  Musculoskeletal: Normal range of motion.  Neurological: He is alert and oriented to person, place, and time.  Skin: No rash noted. No pallor.  Psychiatric: He has a normal mood and affect. His behavior is normal.  Nursing note and vitals reviewed.    ED Treatments / Results   Labs (all labs ordered are listed, but only abnormal results are displayed) Labs Reviewed - No data to display  EKG  EKG Interpretation None       Radiology Dg Ribs Unilateral W/chest Right  Result Date: 03/11/2017 CLINICAL DATA:  Right-sided chest pain. Right lower rib pain. Patient fell. History of diabetes and hypertension. EXAM: RIGHT RIBS AND CHEST - 3+ VIEW COMPARISON:  Chest 06/06/2006 FINDINGS: Postoperative changes in the mediastinum. Shallow inspiration with atelectasis in the lung bases. Heart size and pulmonary vascularity are normal for technique. No airspace disease or consolidation in the lungs. No blunting of costophrenic angles. No  pneumothorax. Calcification of the aorta. Right ribs appear intact. No acute fracture or displacement identified. No focal bone lesion or bone destruction. Soft tissues are unremarkable. IMPRESSION: Shallow inspiration with linear atelectasis in the lung bases. Negative right ribs. Electronically Signed   By: Lucienne Capers M.D.   On: 03/11/2017 21:31   Ct Head Wo Contrast  Result Date: 03/11/2017 CLINICAL DATA:  Patient with injury to the posterior aspect of the head. Initial encounter. No reported loss of consciousness. EXAM: CT HEAD WITHOUT CONTRAST TECHNIQUE: Contiguous axial images were obtained from the base of the skull through the vertex without intravenous contrast. COMPARISON:  None. FINDINGS: Brain: Ventricles and sulci are prominent compatible with atrophy. Periventricular and subcortical white matter hypodensity compatible with microvascular ischemic changes. No evidence for acute cortically based infarct, intracranial hemorrhage, mass lesion or mass-effect. Vascular: Internal carotid arterial vascular calcifications. Skull: Intact.  No evidence for displaced fracture. Sinuses/Orbits: Chronic sinusitis with mucosal thickening. Mastoid air cells unremarkable. Orbits are unremarkable. Other: None. IMPRESSION: No acute intracranial  process. Atrophy and chronic microvascular ischemic changes. Electronically Signed   By: Lovey Newcomer M.D.   On: 03/11/2017 21:24    Procedures .Marland KitchenLaceration Repair Date/Time: 03/11/2017 10:23 PM Performed by: Deno Etienne, DO Authorized by: Deno Etienne, DO   Consent:    Consent obtained:  Verbal   Consent given by:  Patient and spouse   Risks discussed:  Infection, pain, poor cosmetic result and poor wound healing   Alternatives discussed:  No treatment and delayed treatment Anesthesia (see MAR for exact dosages):    Anesthesia method:  Local infiltration   Local anesthetic:  Lidocaine 1% WITH epi Laceration details:    Location:  Scalp   Scalp location:  Occipital   Length (cm):  0.5 Repair type:    Repair type:  Simple Pre-procedure details:    Preparation:  Patient was prepped and draped in usual sterile fashion Exploration:    Hemostasis achieved with:  Direct pressure and epinephrine   Contaminated: no   Treatment:    Wound cleansed with: Chlorhexidine.   Amount of cleaning:  Standard   Visualized foreign bodies/material removed: no   Skin repair:    Repair method:  Sutures   Suture size:  5-0   Suture material:  Fast-absorbing gut   Suture technique:  Simple interrupted Approximation:    Approximation:  Close Post-procedure details:    Dressing:  Open (no dressing)   Patient tolerance of procedure:  Tolerated well, no immediate complications   (including critical care time)  Medications Ordered in ED Medications  lidocaine-EPINEPHrine (XYLOCAINE W/EPI) 2 %-1:200000 (PF) injection 20 mL (20 mLs Intradermal Given 03/11/17 2126)     Initial Impression / Assessment and Plan / ED Course  I have reviewed the triage vital signs and the nursing notes.  Pertinent labs & imaging results that were available during my care of the patient were reviewed by me and considered in my medical decision making (see chart for details).     82 yo M with a chief complaint of a  fall.  This is mechanical.  He struck the back of his head.  Will CT.  Has some mild right chest wall pain will x-ray.  Tdap is up-to-date.  Wound sutured at bedside.  Discharge home.  10:24 PM:  I have discussed the diagnosis/risks/treatment options with the patient and family and believe the pt to be eligible for discharge home to follow-up with PCP. We also discussed returning to the ED  immediately if new or worsening sx occur. We discussed the sx which are most concerning (e.g., sudden worsening pain, fever, inability to tolerate by mouth) that necessitate immediate return. Medications administered to the patient during their visit and any new prescriptions provided to the patient are listed below.  Medications given during this visit Medications  lidocaine-EPINEPHrine (XYLOCAINE W/EPI) 2 %-1:200000 (PF) injection 20 mL (20 mLs Intradermal Given 03/11/17 2126)     The patient appears reasonably screen and/or stabilized for discharge and I doubt any other medical condition or other [facility] requiring further screening, evaluation, or treatment in the ED at this time prior to discharge.    Final Clinical Impressions(s) / ED Diagnoses   Final diagnoses:  Injury of head, initial encounter  Flank pain  Laceration of scalp without foreign body, initial encounter    ED Discharge Orders    None       Deno Etienne, DO 03/11/17 2224

## 2017-05-05 DIAGNOSIS — H61002 Unspecified perichondritis of left external ear: Secondary | ICD-10-CM | POA: Diagnosis not present

## 2017-05-05 DIAGNOSIS — D1801 Hemangioma of skin and subcutaneous tissue: Secondary | ICD-10-CM | POA: Diagnosis not present

## 2017-05-05 DIAGNOSIS — D485 Neoplasm of uncertain behavior of skin: Secondary | ICD-10-CM | POA: Diagnosis not present

## 2017-05-05 DIAGNOSIS — L821 Other seborrheic keratosis: Secondary | ICD-10-CM | POA: Diagnosis not present

## 2017-05-05 DIAGNOSIS — D225 Melanocytic nevi of trunk: Secondary | ICD-10-CM | POA: Diagnosis not present

## 2017-08-10 DIAGNOSIS — E1129 Type 2 diabetes mellitus with other diabetic kidney complication: Secondary | ICD-10-CM | POA: Diagnosis not present

## 2017-08-10 DIAGNOSIS — I10 Essential (primary) hypertension: Secondary | ICD-10-CM | POA: Diagnosis not present

## 2017-08-10 DIAGNOSIS — R82998 Other abnormal findings in urine: Secondary | ICD-10-CM | POA: Diagnosis not present

## 2017-08-10 DIAGNOSIS — E782 Mixed hyperlipidemia: Secondary | ICD-10-CM | POA: Diagnosis not present

## 2017-08-10 DIAGNOSIS — Z125 Encounter for screening for malignant neoplasm of prostate: Secondary | ICD-10-CM | POA: Diagnosis not present

## 2017-08-16 DIAGNOSIS — N183 Chronic kidney disease, stage 3 (moderate): Secondary | ICD-10-CM | POA: Diagnosis not present

## 2017-08-16 DIAGNOSIS — R296 Repeated falls: Secondary | ICD-10-CM | POA: Diagnosis not present

## 2017-08-16 DIAGNOSIS — F3289 Other specified depressive episodes: Secondary | ICD-10-CM | POA: Diagnosis not present

## 2017-08-16 DIAGNOSIS — I10 Essential (primary) hypertension: Secondary | ICD-10-CM | POA: Diagnosis not present

## 2017-08-16 DIAGNOSIS — I25810 Atherosclerosis of coronary artery bypass graft(s) without angina pectoris: Secondary | ICD-10-CM | POA: Diagnosis not present

## 2017-08-16 DIAGNOSIS — R809 Proteinuria, unspecified: Secondary | ICD-10-CM | POA: Diagnosis not present

## 2017-08-16 DIAGNOSIS — E782 Mixed hyperlipidemia: Secondary | ICD-10-CM | POA: Diagnosis not present

## 2017-08-16 DIAGNOSIS — E1129 Type 2 diabetes mellitus with other diabetic kidney complication: Secondary | ICD-10-CM | POA: Diagnosis not present

## 2017-08-16 DIAGNOSIS — Z0000 Encounter for general adult medical examination without abnormal findings: Secondary | ICD-10-CM | POA: Diagnosis not present

## 2017-08-22 DIAGNOSIS — F329 Major depressive disorder, single episode, unspecified: Secondary | ICD-10-CM | POA: Diagnosis not present

## 2017-08-28 ENCOUNTER — Ambulatory Visit: Payer: Medicare Other | Admitting: Cardiovascular Disease

## 2017-08-28 ENCOUNTER — Encounter: Payer: Self-pay | Admitting: Cardiovascular Disease

## 2017-08-28 VITALS — BP 136/80 | HR 66 | Ht 69.0 in | Wt 185.8 lb

## 2017-08-28 DIAGNOSIS — I25118 Atherosclerotic heart disease of native coronary artery with other forms of angina pectoris: Secondary | ICD-10-CM

## 2017-08-28 DIAGNOSIS — E1151 Type 2 diabetes mellitus with diabetic peripheral angiopathy without gangrene: Secondary | ICD-10-CM | POA: Diagnosis not present

## 2017-08-28 DIAGNOSIS — I25119 Atherosclerotic heart disease of native coronary artery with unspecified angina pectoris: Secondary | ICD-10-CM

## 2017-08-28 NOTE — Progress Notes (Signed)
Cardiology Office Note Date:  08/28/2017   ID:  [name], DOB February 25, 1935, MRN [id number]  PCP:  Marton Redwood, MD  Cardiologist:  Sherren Mocha, MD    Chief Complaint  Patient presents with  . Follow-up    CAD  . Chest Pain    History of Present Illness: [name] is a 83 y.o. male who presents for follow-up of coronary artery disease.  The patient underwent multivessel CABG in 2008 after presenting with unstable angina.  He underwent grafting with a LIMA to LAD, sequential saphenous vein graft to first, second, and third OM's, and sequential vein graft to the RCA and right PDA.  The patient is here alone today.  He has been involved in a recent move.  Last week he had an episode of left upper chest discomfort that reminded him of his symptoms predating coronary bypass surgery.  He describes a pressure-like sensation in the left upper chest that resolved with rest.  The pain was nonradiating and not associated with shortness of breath.  He has had no symptoms since that time.  He denies edema, heart palpitations, orthopnea, PND, or lightheadedness.  Past Medical History:  Diagnosis Date  . Acute renal failure ([place])    secondary to nephrotic syndrome post cath, resolved with steroids  . CAD (coronary artery disease) January 2008   LIMA to LAD, sequential SVG to first, second, and third OM's, sequential SVG to mid RCA and PDA   . Diabetes mellitus without complication ([place])   . HTN (hypertension)   . Hx of adenomatous colonic polyps   . Hyperlipidemia     Past Surgical History:  Procedure Laterality Date  . COLONOSCOPY W/ BIOPSIES AND POLYPECTOMY    . CORONARY ARTERY BYPASS GRAFT  2008   VESSELS X6  . UMBILICAL HERNIA REPAIR      Current Outpatient Medications  Medication Sig Dispense Refill  . aspirin EC 81 MG tablet Take 81 mg by mouth daily.    Marland Kitchen atorvastatin (LIPITOR) 40 MG tablet Take 40 mg by mouth daily.    . carvedilol (COREG) 12.5 MG tablet Take  12.5 mg by mouth 2 (two) times daily with a meal.    . CIALIS 5 MG tablet Take 1 tablet by mouth every evening.  5  . furosemide (LASIX) 20 MG tablet Take one tablet by mouth    . JARDIANCE 25 MG TABS tablet Take 1 tablet by mouth every evening.  10  . losartan (COZAAR) 100 MG tablet Take 100 mg by mouth daily.    . Saxagliptin-Metformin (KOMBIGLYZE XR) 2.08-998 MG TB24 Take 1,000 mg by mouth 2 (two) times daily.     . vitamin E 100 UNIT capsule Take 100 Units by mouth daily.    Marland Kitchen ZETIA 10 MG tablet Take 1 tablet by mouth every morning.     No current facility-administered medications for this visit.     Allergies:   Patient has no known allergies.   Social History:  The patient  reports that he has never smoked. He has never used smokeless tobacco. He reports that he does not drink alcohol or use drugs.   Family History:  The patient's family history includes Clotting disorder in his father; Heart attack in his mother; Heart disease in his sister.   ROS:  Please see the history of present illness.  Otherwise, review of systems is positive for depression.  All other systems are reviewed and negative.   PHYSICAL EXAM: VS:  BP 136/80   Pulse 66   Ht 5\' 9"  (1.753 m)   Wt 185 lb 12.8 oz (84.3 kg)   BMI 27.44 kg/m  , BMI Body mass index is 27.44 kg/m. GEN: Well nourished, well developed, in no acute distress  HEENT: normal  Neck: no JVD, no masses. No carotid bruits Cardiac: RRR without murmur or gallop      Respiratory:  clear to auscultation bilaterally, normal work of breathing GI: soft, nontender, nondistended, + BS MS: no deformity or atrophy  Ext: no pretibial edema, pedal pulses 2+= bilaterally Skin: warm and dry, no rash Neuro:  Strength and sensation are intact Psych: euthymic mood, full affect  EKG:  EKG is ordered today. The ekg ordered today shows normal sinus rhythm 66 bpm, PACs, age-indeterminate inferior infarct.  No change compared with previous tracing  07/29/2016.  Recent Labs: No results found for requested labs within last 8760 hours.   Lipid Panel     Component Value Date/Time   CHOL 128 09/20/2007 0826   TRIG 67 09/20/2007 0826   HDL 33.0 (L) 09/20/2007 0826   CHOLHDL 3.9 CALC 09/20/2007 0826   VLDL 13 09/20/2007 0826   LDLCALC 82 09/20/2007 0826      Wt Readings from Last 3 Encounters:  08/28/17 185 lb 12.8 oz (84.3 kg)  03/11/17 191 lb 12.8 oz (87 kg)  07/29/16 188 lb 12.8 oz (85.6 kg)    ASSESSMENT AND PLAN: 1.  CAD, native vessel, with angina: The patient has a recent episode of recurrent angina.  I have recommended an exercise Myoview stress test for further risk stratification since his symptoms are reminiscent of his previous ischemic chest pain.  He will continue on aspirin, high intensity statin drug, and a beta-blocker.  2.  Hypertension: Treated with carvedilol and losartan.  Blood pressure is controlled.  3.  Hyperlipidemia: Treated with atorvastatin 40 mg.  Recent labs demonstrate a cholesterol of 107, HDL 36, LDL 58, triglycerides 63.  4.  Type 2 diabetes: Managed by Dr. Brigitte Pulse.  Hemoglobin A1c is 7.0.  He is treated with oral hypoglycemics and his regimen is reviewed today.  We discussed lifestyle modification.  Current medicines are reviewed with the patient today.  The patient does not have concerns regarding medicines.  Labs/ tests ordered today include:   Orders Placed This Encounter  Procedures  . MYOCARDIAL PERFUSION IMAGING  . EKG 12-Lead   Disposition:   FU one year  Signed, Sherren Mocha, MD  08/28/2017 2:06 [facility] [address], [place], [place]  [address] Phone: [phone]; Fax: [phone]

## 2017-08-28 NOTE — Patient Instructions (Signed)
Medication Instructions:  Your provider recommends that you continue on your current medications as directed. Please refer to the Current Medication list given to you today.    Labwork: None  Testing/Procedures: Dr. Burt Knack recommends you have a NUCLEAR STRESS TEST. Please review instruction sheet for more information.   Follow-Up: Your provider wants you to follow-up in: 1 year with Dr. Burt Knack. You will receive a reminder letter in the mail two months in advance. If you don't receive a letter, please call our office to schedule the follow-up appointment.    Any Other Special Instructions Will Be Listed Below (If Applicable).     If you need a refill on your cardiac medications before your next appointment, please call your pharmacy.

## 2017-08-31 ENCOUNTER — Telehealth (HOSPITAL_COMMUNITY): Payer: Self-pay | Admitting: *Deleted

## 2017-08-31 NOTE — Telephone Encounter (Signed)
Patient given detailed instructions per Myocardial Perfusion Study Information Sheet for the test on 09/04/17 Patient notified to arrive 15 minutes early and that it is imperative to arrive on time for appointment to keep from having the test rescheduled.  If you need to cancel or reschedule your appointment, please call the office within 24 hours of your appointment. . Patient verbalized understanding. Naarah Borgerding Jacqueline    

## 2017-09-04 ENCOUNTER — Ambulatory Visit (HOSPITAL_COMMUNITY): Payer: Medicare HMO | Attending: Cardiology

## 2017-09-04 DIAGNOSIS — I10 Essential (primary) hypertension: Secondary | ICD-10-CM | POA: Diagnosis not present

## 2017-09-04 DIAGNOSIS — I25118 Atherosclerotic heart disease of native coronary artery with other forms of angina pectoris: Secondary | ICD-10-CM | POA: Diagnosis not present

## 2017-09-04 DIAGNOSIS — E785 Hyperlipidemia, unspecified: Secondary | ICD-10-CM | POA: Diagnosis not present

## 2017-09-04 DIAGNOSIS — E119 Type 2 diabetes mellitus without complications: Secondary | ICD-10-CM | POA: Diagnosis not present

## 2017-09-04 LAB — MYOCARDIAL PERFUSION IMAGING
CHL CUP NUCLEAR SDS: 3
CHL CUP RESTING HR STRESS: 69 {beats}/min
CSEPPHR: 150 {beats}/min
Estimated workload: 6.4 METS
Exercise duration (min): 4 min
Exercise duration (sec): 30 s
LHR: 0.37
LVDIAVOL: 96 mL (ref 62–150)
LVSYSVOL: 48 mL
MPHR: 139 {beats}/min
Percent HR: 107 %
RPE: 18
SRS: 8
SSS: 11
TID: 1.11

## 2017-09-04 MED ORDER — TECHNETIUM TC 99M TETROFOSMIN IV KIT
30.7000 | PACK | Freq: Once | INTRAVENOUS | Status: AC | PRN
  Administered 2017-09-04: 30.7 via INTRAVENOUS
  Filled 2017-09-04: qty 31

## 2017-09-04 MED ORDER — TECHNETIUM TC 99M TETROFOSMIN IV KIT
10.6000 | PACK | Freq: Once | INTRAVENOUS | Status: AC | PRN
  Administered 2017-09-04: 10.6 via INTRAVENOUS
  Filled 2017-09-04: qty 11

## 2017-09-05 DIAGNOSIS — F329 Major depressive disorder, single episode, unspecified: Secondary | ICD-10-CM | POA: Diagnosis not present

## 2017-09-12 ENCOUNTER — Telehealth: Payer: Self-pay | Admitting: Cardiovascular Disease

## 2017-09-12 NOTE — Telephone Encounter (Signed)
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 Would continue current Rx. Pt should be advised to call if recurrent chest pain.

## 2017-09-12 NOTE — Telephone Encounter (Signed)
Informed patient of results and verbal understanding expressed.  Patient understands to call if he experiences recurrent CP.

## 2017-09-12 NOTE — Telephone Encounter (Signed)
Left message to call back  

## 2017-09-12 NOTE — Telephone Encounter (Signed)
New message   Please return call for results:MYOCARDIAL PERFUSION

## 2017-09-21 DIAGNOSIS — F329 Major depressive disorder, single episode, unspecified: Secondary | ICD-10-CM | POA: Diagnosis not present

## 2017-10-02 DIAGNOSIS — M25551 Pain in right hip: Secondary | ICD-10-CM | POA: Insufficient documentation

## 2017-10-06 ENCOUNTER — Ambulatory Visit: Payer: Medicare HMO | Attending: Internal Medicine | Admitting: Physical Therapy

## 2017-10-06 ENCOUNTER — Encounter: Payer: Self-pay | Admitting: Physical Therapy

## 2017-10-06 DIAGNOSIS — R2681 Unsteadiness on feet: Secondary | ICD-10-CM | POA: Diagnosis not present

## 2017-10-06 DIAGNOSIS — R2689 Other abnormalities of gait and mobility: Secondary | ICD-10-CM | POA: Insufficient documentation

## 2017-10-06 NOTE — Therapy (Signed)
[facility] [address] [place], [place], [address] Phone: [phone]   Fax:  [phone]  Physical Therapy Evaluation  Patient Details  Name: [name] MRN: [id number] Date of Birth: 03/20/1935 Referring Provider: Brigitte Pulse   Encounter Date: 10/06/2017  PT End of Session - 10/06/17 1851    Visit Number  1    Number of Visits  4    Date for PT Re-Evaluation  12/05/17    Authorization Type       PT Start Time  0852    PT Stop Time  0933    PT Time Calculation (min)  41 min    Activity Tolerance  Patient tolerated treatment well    Behavior During Therapy  [facility] for tasks assessed/performed       Past Medical History:  Diagnosis Date  . Acute renal failure ([place])    secondary to nephrotic syndrome post cath, resolved with steroids  . CAD (coronary artery disease) January 2008   LIMA to LAD, sequential SVG to first, second, and third OM's, sequential SVG to mid RCA and PDA   . Diabetes mellitus without complication ([place])   . HTN (hypertension)   . Hx of adenomatous colonic polyps   . Hyperlipidemia     Past Surgical History:  Procedure Laterality Date  . COLONOSCOPY W/ BIOPSIES AND POLYPECTOMY    . CORONARY ARTERY BYPASS GRAFT  2008   VESSELS X6  . UMBILICAL HERNIA REPAIR      There were no vitals filed for this visit.   Subjective Assessment - 10/06/17 0855    Subjective  I have an appointment with Dr. Ninfa Linden for my hip.  I've always been stiff and not flexible. I did the [place] program at [facility] for about 8 years, but don't do that anymore.  Have had one fall in the past 6 months-fell backwards trying to get washing machine out of my truck.  Always feel like I'm falling forward.  Does not use assistive device.    Pertinent History  Hx of hip pain (referral to orthopedic surgery-to see 10/16/17);     Patient Stated Goals  Pt's goals for physical therapy are to eliminate feeling of falling.    Currently in  Pain?  Yes    Pain Score  9     Pain Location  Hip    Pain Orientation  Right    Pain Descriptors / Indicators  Aching;Sharp    Pain Type  Chronic pain    Pain Onset  More than a month ago    Pain Frequency  Intermittent    Aggravating Factors   standing and walking    Pain Relieving Factors  sitting    Effect of Pain on Daily Activities  PT will monitor pain, but will not address as a goal at this time.         [facility] PT Assessment - 10/06/17 0901      Assessment   Medical Diagnosis  Falls    Referring Provider  Brigitte Pulse    Onset Date/Surgical Date  09/22/17 date of referral      Precautions   Precautions  Fall      Balance Screen   Has the patient fallen in the past 6 months  Yes    How many times?  1    Has the patient had a decrease in activity level because of a fear of falling?   Yes    Is the patient reluctant  to leave their home because of a fear of falling?   No      Home Environment   Living Environment  Private residence    Living Arrangements  Alone    Available Help at Discharge  Family    Type of [place] to enter    Entrance Stairs-Number of Steps  2    Entrance Stairs-Rails  None    Home Layout  Two level;Able to live on main level with bedroom/bathroom      Prior Function   Level of Independence  Independent    Leisure  Enjoys working with tools and working on car      Observation/Other Assessments   Focus on Therapeutic Outcomes (FOTO)   NA      Posture/Postural Control   Posture/Postural Control  Postural limitations    Postural Limitations  Forward head;Rounded Shoulders Slightly forward flexed posture with gait      ROM / Strength   AROM / PROM / Strength  Strength      Strength   Strength Assessment Site  Hip;Knee;Ankle    Right/Left Hip  Right;Left    Right Hip Flexion  4/5    Left Hip Flexion  4/5    Right/Left Knee  Right;Left    Right Knee Flexion  4+/5    Right Knee Extension  4+/5    Left Knee Flexion   4+/5    Left Knee Extension  4+/5    Right/Left Ankle  Right;Left    Right Ankle Dorsiflexion  4+/5    Left Ankle Dorsiflexion  4+/5      Transfers   Transfers  Sit to Stand;Stand to Sit    Sit to Stand  6: Modified independent (Device/Increase time);With upper extremity assist;From chair/3-in-1    Stand to Sit  6: Modified independent (Device/Increase time);With upper extremity assist;To chair/3-in-1    Comments  Upon standing, pt has to stand and shift weight prior to initiating gait due to R hip pain.      Ambulation/Gait   Ambulation/Gait  Yes    Ambulation/Gait Assistance  7: Independent    Ambulation Distance (Feet)  250 Feet    Assistive device  None    Gait Pattern  Step-through pattern;Antalgic    Ambulation Surface  Level;Indoor    Gait velocity  9.87 sec = 3.32 ft/sec      Standardized Balance Assessment   Standardized Balance Assessment  Timed Up and Go Test      Timed Up and Go Test   Normal TUG (seconds)  9.9    TUG Comments  Scores <13.5 sec indicate increased fall risk      High Level Balance   High Level Balance Comments  Standing on solid surface EO and EC x 30 seconds; standing on foam EO and EC x 30 seconds (increased sway with EC)      Functional Gait  Assessment   Gait assessed   Yes    Gait Level Surface  Walks 20 ft in less than 7 sec but greater than 5.5 sec, uses assistive device, slower speed, mild gait deviations, or deviates 6-10 in outside of the 12 in walkway width.    Change in Gait Speed  Able to smoothly change walking speed without loss of balance or gait deviation. Deviate no more than 6 in outside of the 12 in walkway width.    Gait with Horizontal Head Turns  Performs head turns smoothly  with slight change in gait velocity (eg, minor disruption to smooth gait path), deviates 6-10 in outside 12 in walkway width, or uses an assistive device.    Gait with Vertical Head Turns  Performs task with slight change in gait velocity (eg, minor disruption  to smooth gait path), deviates 6 - 10 in outside 12 in walkway width or uses assistive device    Gait and Pivot Turn  Pivot turns safely within 3 sec and stops quickly with no loss of balance.    Step Over Obstacle  Is able to step over one shoe box (4.5 in total height) without changing gait speed. No evidence of imbalance.    Gait with Narrow Base of Support  Ambulates less than 4 steps heel to toe or cannot perform without assistance.    Gait with Eyes Closed  Walks 20 ft, slow speed, abnormal gait pattern, evidence for imbalance, deviates 10-15 in outside 12 in walkway width. Requires more than 9 sec to ambulate 20 ft. veers to R    Ambulating Backwards  Walks 20 ft, uses assistive device, slower speed, mild gait deviations, deviates 6-10 in outside 12 in walkway width. 16.07    Steps  Alternating feet, must use rail.    Total Score  19    FGA comment:  Scores< 19/30 indicate increased fall risk                Objective measurements completed on examination: See above findings.                   PT Long Term Goals - 10/06/17 1857      PT LONG TERM GOAL #1   Title  Pt will be independent with HEP to address balance.   TARGE 11/03/17    Time  4    Period  Weeks    Status  New    Target Date  11/03/17      PT LONG TERM GOAL #2   Title  Pt will improve FGA to at least 22/30 for decreased fall risk.    Time  4    Period  Weeks    Status  New    Target Date  11/03/17      PT LONG TERM GOAL #3   Title  Pt will verbalize understanding of fall prevention in home environment.    Time  4    Period  Weeks    Status  New    Target Date  11/03/17             Plan - 10/06/17 1853    Clinical Impression Statement  Pt is an 84 year old male who presents to OP PT for falls eval.  He reports having one fall in the past six months, which occurred when pulling a washer out of the back of a truck.  However, he reports often feeling that he will fall forwards.  He  scored 19/30 on FGA, indicated increased risk of falls.  He has increased sway on standing on foam with EC, but is able to maintain for 30 seconds.  He would benefit from further balance testing with Sensory Organization Test to fully check sensory systems for balance as well as fall prevention education and HEP.     History and Personal Factors relevant to plan of care:  PMH >3 co-morbidities, R hip pain (to see orthopedic surgeon 10/16/17)    Clinical Presentation  Stable    Clinical Presentation due  to:  hx of fall, risk of fall per FGA    Clinical Decision Making  Low    Rehab Potential  Good    PT Frequency  1x / week    PT Duration  4 weeks May depend on pt's appointment with orthopedic surgeon    PT Treatment/Interventions  ADLs/Self Care Home Management;Gait training;Functional mobility training;Therapeutic exercise;Balance training;Therapeutic activities;Patient/family education;Neuromuscular re-education    PT Next Visit Plan  Perform Sensory Organization test; initiate balance HEP, fall prevention, make further appointments if warranted beyond next visit       Patient will benefit from skilled therapeutic intervention in order to improve the following deficits and impairments:  Abnormal gait, Decreased balance  Visit Diagnosis: Other abnormalities of gait and mobility  Unsteadiness on feet     Problem List Patient Active Problem List   Diagnosis Date Noted  . Personal history of colonic adenomas 03/26/2013  . OBSTRUCTIVE SLEEP APNEA 10/30/2008  . HYPERLIPIDEMIA-MIXED 07/01/2008  . HYPERTENSION, BENIGN 07/01/2008  . CAD, NATIVE VESSEL 07/01/2008  . FATIGUE / MALAISE 07/01/2008  . EDEMA 07/01/2008    MARRIOTT,AMY W. 10/06/2017, 7:03 PM Frazier Butt., PT  [facility] [address] [place], [place], [address] Phone: [phone]   Fax:  [phone]  Name: [name] MRN: [id number] Date of Birth:  10/28/1934

## 2017-10-11 DIAGNOSIS — F329 Major depressive disorder, single episode, unspecified: Secondary | ICD-10-CM | POA: Diagnosis not present

## 2017-10-12 DIAGNOSIS — H5212 Myopia, left eye: Secondary | ICD-10-CM | POA: Diagnosis not present

## 2017-10-13 ENCOUNTER — Encounter: Payer: Self-pay | Admitting: Physical Therapy

## 2017-10-13 ENCOUNTER — Ambulatory Visit: Payer: Medicare HMO | Admitting: Physical Therapy

## 2017-10-13 DIAGNOSIS — R2681 Unsteadiness on feet: Secondary | ICD-10-CM

## 2017-10-13 DIAGNOSIS — R2689 Other abnormalities of gait and mobility: Secondary | ICD-10-CM | POA: Diagnosis not present

## 2017-10-13 NOTE — Patient Instructions (Addendum)
Pectoral Stretch    With arms behind doorjamb or corner, gently lean forward. Stretch is felt across chest. Hold _15-30___ seconds. Repeat _3___ times. Do __1-2__ sessions per day.  http://gt2.exer.us/32   Copyright  VHI. All rights reserved.  Scapular Retraction (Standing)    Standing at the doorframe, with arms at sides, pinch shoulder blades together. Repeat _10___ times per set. Do _1-2___  sessions per day.  http://orth.exer.us/944   Copyright  VHI. All rights reserved.  Feet Heel-Toe "Tandem", Head Motion - Eyes Open    With eyes open, right foot directly in front of the other, move head slowly: up and down 5 times,  Repeat side to side__5__ times per session. Do __1-2__ sessions per day.  Repeat with other foot in front.  Copyright  VHI. All rights reserved.

## 2017-10-14 NOTE — Therapy (Signed)
[facility] [address] [place], [place], [address] Phone: [phone]   Fax:  [phone]  Physical Therapy Treatment  Patient Details  Name: [name] MRN: [id number] Date of Birth: Sep 18, 1934 Referring Provider: Brigitte Pulse   Encounter Date: 10/13/2017  PT End of Session - 10/14/17 0952    Visit Number  2    Number of Visits  4    Date for PT Re-Evaluation  12/05/17    Authorization Type       PT Start Time  0804    PT Stop Time  5176    PT Time Calculation (min)  43 min    Activity Tolerance  Patient tolerated treatment well    Behavior During Therapy  [facility] for tasks assessed/performed       Past Medical History:  Diagnosis Date  . Acute renal failure ([place])    secondary to nephrotic syndrome post cath, resolved with steroids  . CAD (coronary artery disease) January 2008   LIMA to LAD, sequential SVG to first, second, and third OM's, sequential SVG to mid RCA and PDA   . Diabetes mellitus without complication ([place])   . HTN (hypertension)   . Hx of adenomatous colonic polyps   . Hyperlipidemia     Past Surgical History:  Procedure Laterality Date  . COLONOSCOPY W/ BIOPSIES AND POLYPECTOMY    . CORONARY ARTERY BYPASS GRAFT  2008   VESSELS X6  . UMBILICAL HERNIA REPAIR      There were no vitals filed for this visit.  Subjective Assessment - 10/13/17 0805    Subjective  No changes.  Been taking some of the Glucosamine, and it seems to help.    Pertinent History  Hx of hip pain (referral to orthopedic surgery-to see 10/16/17);     Patient Stated Goals  Pt's goals for physical therapy are to eliminate feeling of falling.    Currently in Pain?  Yes    Pain Score  4     Pain Location  Hip    Pain Orientation  Right    Pain Descriptors / Indicators  Aching    Pain Type  Chronic pain    Pain Onset  More than a month ago    Aggravating Factors   standing and walking    Pain Relieving Factors  sitting           Neuro re-ed: sensory organization test performed with following results: Conditions: 1: WNL 2: WNL 3: WNL  4: WN: 5: WNL 6: WNL for trials 1 and 3; increased posterior lean, resulting in "fall", having to stop trial early Composite score: 69 (WNL) Sensory Analysis Som: WNL Vis: WNL Vest: WNL Pref: WNL Strategy analysis: WNL, except trial 2 of Condition 5, where pt is ankle strategy dominant       COG alignment: slightly to L of center                    [facility] Adult PT Treatment/Exercise - 10/14/17 0001      Posture/Postural Control   Posture Comments  Posture exercises initiated at corner:  corner pec stretch 3 x 30 seconds, with cues for positioning of UEs to avoid pt's reports of shoulder discomfort.  Standing at doorframe with scapular retraction, x 10 reps       Self-Care   Self-Care  Other Self-Care Comments    Other Self-Care Comments   Explained results of Sensory Organization test, including sensory analysis, strategy  analysis, COG.  Discussed beginning exercises to address simulated condition 6 (where pt experienced fall).  Discussed POC, including pt wanting to wait for further PT until seeing orthopedic MD for hip next week.  He is interested in transitioning back to [employer] for community exercise at [facility].          Balance Exercises - 10/14/17 0945      Balance Exercises: Standing   Tandem Stance  Eyes open;Eyes closed;Intermittent upper extremity support Head turns/head nods 5 reps, 2 sets; EC 10 sec x 2        PT Education - 10/14/17 0951    Education Details  Results of Sensory Organization test, HEP initiated-see instructions    Person(s) Educated  Patient    Methods  Explanation;Demonstration;Handout    Comprehension  Verbalized understanding;Returned demonstration;Verbal cues required          PT Long Term Goals - 10/06/17 1857      PT LONG TERM GOAL #1   Title  Pt will be independent with HEP to address balance.    TARGE 11/03/17    Time  4    Period  Weeks    Status  New    Target Date  11/03/17      PT LONG TERM GOAL #2   Title  Pt will improve FGA to at least 22/30 for decreased fall risk.    Time  4    Period  Weeks    Status  New    Target Date  11/03/17      PT LONG TERM GOAL #3   Title  Pt will verbalize understanding of fall prevention in home environment.    Time  4    Period  Weeks    Status  New    Target Date  11/03/17            Plan - 10/14/17 0952    Clinical Impression Statement  Sensory Organization test performed today, with composite score within functional limits, as well as sensory systems.  Pt does have one episode of "fall" on condition 6 (with posterior lean, unable to regain balance); he reports he feels more like he will fall forward.  Began to address posture and balance with HEP.  Pt wants to wait for orthopedic visit to MD to address R hip pain and then may come for remaining PT visits to progress HEP and transition to community fitness.     Rehab Potential  Good    PT Frequency  1x / week    PT Duration  4 weeks May depend on pt's appointment with orthopedic surgeon    PT Treatment/Interventions  ADLs/Self Care Home Management;Gait training;Functional mobility training;Therapeutic exercise;Balance training;Therapeutic activities;Patient/family education;Neuromuscular re-education    PT Next Visit Plan  Review HEP and progress balance/posture exercises; transition to community fitness program-pt wants to try [facility] at [facility]       Patient will benefit from skilled therapeutic intervention in order to improve the following deficits and impairments:  Abnormal gait, Decreased balance  Visit Diagnosis: Unsteadiness on feet     Problem List Patient Active Problem List   Diagnosis Date Noted  . Personal history of colonic adenomas 03/26/2013  . OBSTRUCTIVE SLEEP APNEA 10/30/2008  . HYPERLIPIDEMIA-MIXED 07/01/2008  . HYPERTENSION, BENIGN 07/01/2008  . CAD,  NATIVE VESSEL 07/01/2008  . FATIGUE / MALAISE 07/01/2008  . EDEMA 07/01/2008    Ligia Duguay W. 10/14/2017, 10:02 AM  Frazier Butt., PT   [facility]  [place], [place], [address] Phone: [phone]   Fax:  [phone]  Name: [name] MRN: [id number] Date of Birth: 11-27-1934

## 2017-10-16 ENCOUNTER — Ambulatory Visit (INDEPENDENT_AMBULATORY_CARE_PROVIDER_SITE_OTHER): Payer: Medicare HMO

## 2017-10-16 ENCOUNTER — Encounter (INDEPENDENT_AMBULATORY_CARE_PROVIDER_SITE_OTHER): Payer: Self-pay | Admitting: Orthopaedic Surgery

## 2017-10-16 ENCOUNTER — Ambulatory Visit (INDEPENDENT_AMBULATORY_CARE_PROVIDER_SITE_OTHER): Payer: Medicare HMO | Admitting: Orthopaedic Surgery

## 2017-10-16 DIAGNOSIS — M25551 Pain in right hip: Secondary | ICD-10-CM

## 2017-10-16 NOTE — Progress Notes (Signed)
Office Visit Note   Patient: [name]           Date of Birth: 1934-09-18           MRN: [id number] Visit Date: 10/16/2017              Requested by: Marton Redwood, MD [address] [place], [place] [address] PCP: Marton Redwood, MD   Assessment & Plan: Visit Diagnoses:  1. Pain in right hip     Plan: Given his clinical exam and x-ray findings this is not appeared to be a hip issue at all.  Certainly he may have some radicular symptoms coming from his spine.  However he is feeling better overall and the BIOflex glucosamine may have helped.  All questions concerns were answered and addressed.  Follow-up will be as needed.  If this worsens in any way though he will let me know.  Follow-Up Instructions: Return if symptoms worsen or fail to improve.   Orders:  Orders Placed This Encounter  Procedures  . XR HIP UNILAT W OR W/O PELVIS 1V RIGHT   No orders of the defined types were placed in this encounter.     Procedures: No procedures performed   Clinical Data: No additional findings.   Subjective: Chief Complaint  Patient presents with  . Right Hip - Pain   The patient is a very pleasant and active 83 year old gentleman comes in with a 80-month history of right hip pain.  His heart in his groin some.  He is been taken BIOflex glucosamine and that has helped some.  He had a recent move and he feels like he may have tweaked things from the move.  The pain is not daily and he reports that his right hip does not hurt today at all.Marland Kitchen  Upon reviewing his medical records he has had some spine issues in the past and is seeing Dr. Sherley Bounds with neurosurgery.  I did review the MRI of his lumbar spine showing significant degenerative disc disease mainly to the right side at multiple levels.  Today he denies any groin pain today, but that is the area where he is hurt and not on the lateral aspect of his hip.  He denies knowing whether or not he has had a hernia before.  He is  never had surgery in this area.  HPI  Review of Systems He currently denies any headache, chest pain, shortness of breath, fever, chills, nausea, vomiting.  Objective: Vital Signs: There were no vitals taken for this visit.  Physical Exam He currently denies any headache, chest pain, shortness of breath, fever, chills, nausea, vomiting. Ortho Exam On examination of his right hip today he has normal hip range of motion that is full with no pain in the groin at all or the lateral aspect of his hip.  Compression of the hip causes no pain.  He cross his leg easily and essentially again has no pain with his right hip on exam today.   Specialty Comments:  No specialty comments available.  Imaging: Xr Hip Unilat W Or W/o Pelvis 1v Right  Result Date: 10/16/2017 An AP pelvis and lateral the right hip shows no acute findings.  The hip joint space is very well maintained and is congruent.  The left hip on the AP view also appears normal and congruent.    PMFS History: Patient Active Problem List   Diagnosis Date Noted  . Personal history of colonic adenomas 03/26/2013  . OBSTRUCTIVE  SLEEP APNEA 10/30/2008  . HYPERLIPIDEMIA-MIXED 07/01/2008  . HYPERTENSION, BENIGN 07/01/2008  . CAD, NATIVE VESSEL 07/01/2008  . FATIGUE / MALAISE 07/01/2008  . EDEMA 07/01/2008   Past Medical History:  Diagnosis Date  . Acute renal failure ([place])    secondary to nephrotic syndrome post cath, resolved with steroids  . CAD (coronary artery disease) January 2008   LIMA to LAD, sequential SVG to first, second, and third OM's, sequential SVG to mid RCA and PDA   . Diabetes mellitus without complication ([place])   . HTN (hypertension)   . Hx of adenomatous colonic polyps   . Hyperlipidemia     Family History  Problem Relation Age of Onset  . Heart attack Mother   . Heart disease Sister   . Clotting disorder Father        died of renal disease  . Colon cancer Neg Hx     Past Surgical History:  Procedure  Laterality Date  . COLONOSCOPY W/ BIOPSIES AND POLYPECTOMY    . CORONARY ARTERY BYPASS GRAFT  2008   VESSELS X6  . UMBILICAL HERNIA REPAIR     Social History   Occupational History  . Occupation: Surveyor, mining  Tobacco Use  . Smoking status: Never Smoker  . Smokeless tobacco: Never Used  Substance and Sexual Activity  . Alcohol use: No    Alcohol/week: 0.6 oz    Types: 1 Glasses of wine per week  . Drug use: No  . Sexual activity: Not on file

## 2017-10-27 ENCOUNTER — Other Ambulatory Visit: Payer: Self-pay | Admitting: Pharmacist

## 2017-10-27 NOTE — Patient Outreach (Signed)
[facility]) Care Management  10/27/2017  CHANC KERVIN 19-Sep-1934 [id number]   Incoming call from Hans Eden in response to the [facility] Medication Adherence Campaign. Speak with patient. HIPAA identifiers verified and verbal consent received.  Mr. Ledgerwood reports that he takes his losartan once daily as directed. Reports that he might miss a dose occasionally if he forgets. Counsel patient on the importance of medication adherence. Mr. Fontenot reports that his daughter fills his pillbox for him. Counsel patient about the option of setting a daily alarm to further help with medication adherence. Patient verbalizes understanding. Mr. Goerner denies any further medication questions/concerns at this time.  Will close pharmacy episode.  Harlow Asa, PharmD, [facility] [phone]

## 2017-11-06 DIAGNOSIS — F329 Major depressive disorder, single episode, unspecified: Secondary | ICD-10-CM | POA: Diagnosis not present

## 2017-12-05 DIAGNOSIS — F329 Major depressive disorder, single episode, unspecified: Secondary | ICD-10-CM | POA: Diagnosis not present

## 2017-12-21 DIAGNOSIS — I25810 Atherosclerosis of coronary artery bypass graft(s) without angina pectoris: Secondary | ICD-10-CM | POA: Diagnosis not present

## 2017-12-21 DIAGNOSIS — G3184 Mild cognitive impairment, so stated: Secondary | ICD-10-CM | POA: Diagnosis not present

## 2017-12-21 DIAGNOSIS — E1129 Type 2 diabetes mellitus with other diabetic kidney complication: Secondary | ICD-10-CM | POA: Diagnosis not present

## 2017-12-21 DIAGNOSIS — I10 Essential (primary) hypertension: Secondary | ICD-10-CM | POA: Diagnosis not present

## 2017-12-21 DIAGNOSIS — N183 Chronic kidney disease, stage 3 (moderate): Secondary | ICD-10-CM | POA: Diagnosis not present

## 2017-12-21 DIAGNOSIS — Z23 Encounter for immunization: Secondary | ICD-10-CM | POA: Diagnosis not present

## 2017-12-21 DIAGNOSIS — E782 Mixed hyperlipidemia: Secondary | ICD-10-CM | POA: Diagnosis not present

## 2017-12-21 DIAGNOSIS — E668 Other obesity: Secondary | ICD-10-CM | POA: Diagnosis not present

## 2017-12-21 DIAGNOSIS — M5416 Radiculopathy, lumbar region: Secondary | ICD-10-CM | POA: Diagnosis not present

## 2017-12-26 DIAGNOSIS — F329 Major depressive disorder, single episode, unspecified: Secondary | ICD-10-CM | POA: Diagnosis not present

## 2017-12-28 DIAGNOSIS — M48061 Spinal stenosis, lumbar region without neurogenic claudication: Secondary | ICD-10-CM | POA: Diagnosis not present

## 2017-12-28 DIAGNOSIS — M48062 Spinal stenosis, lumbar region with neurogenic claudication: Secondary | ICD-10-CM | POA: Diagnosis not present

## 2017-12-28 DIAGNOSIS — M5126 Other intervertebral disc displacement, lumbar region: Secondary | ICD-10-CM | POA: Diagnosis not present

## 2018-01-01 DIAGNOSIS — M4316 Spondylolisthesis, lumbar region: Secondary | ICD-10-CM | POA: Diagnosis not present

## 2018-01-16 DIAGNOSIS — M25551 Pain in right hip: Secondary | ICD-10-CM | POA: Diagnosis not present

## 2018-01-16 DIAGNOSIS — R1031 Right lower quadrant pain: Secondary | ICD-10-CM | POA: Diagnosis not present

## 2018-01-23 DIAGNOSIS — M25551 Pain in right hip: Secondary | ICD-10-CM | POA: Diagnosis not present

## 2018-01-25 IMAGING — CT CT HEAD W/O CM
3 of 4 series · 14 of 47 positions shown, 16 images · non-contrast
Comparison: None.

CLINICAL DATA: Patient with injury to the posterior aspect of the
head. Initial encounter. No reported loss of consciousness.

EXAM:
CT HEAD WITHOUT CONTRAST
TECHNIQUE: Contiguous axial images were obtained from the base of the skull
through the vertex without intravenous contrast.

[Series 2: head w/o · axial · non-contrast · 0.46mm/px · z∈[+1133,+1273]mm · 8 of 34 slices shown, 10 images]
[im 3/34  brain]
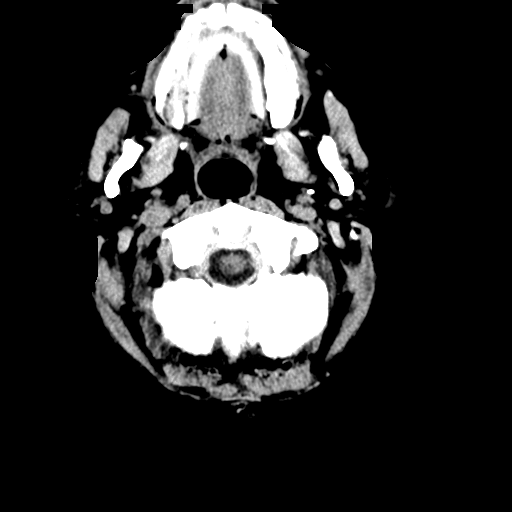
[im 3/34  bone]
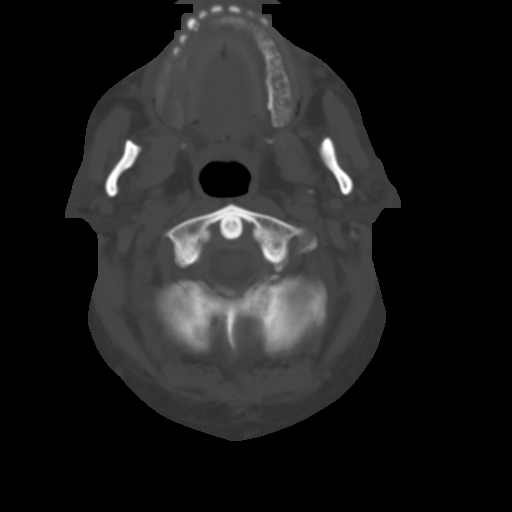
[im 8/34  brain]
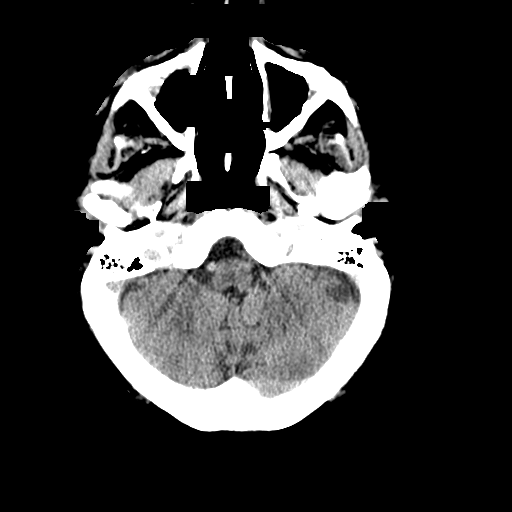
[im 12/34  brain]
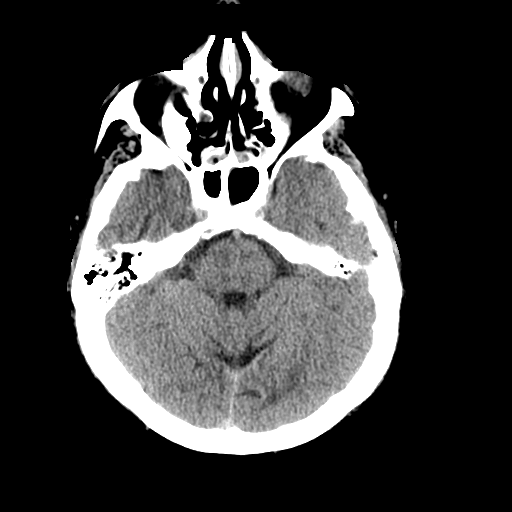
[im 15/34  brain]
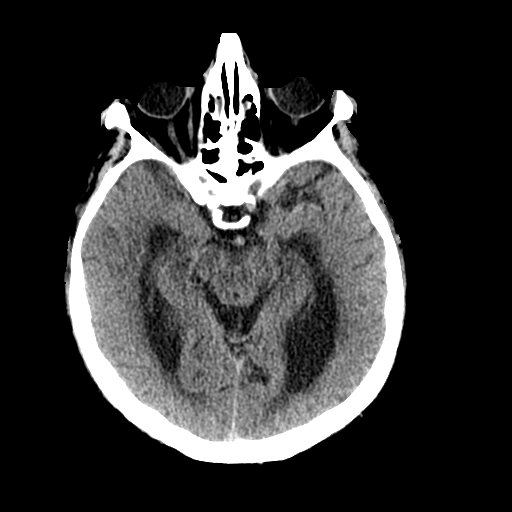
[im 19/34  brain]
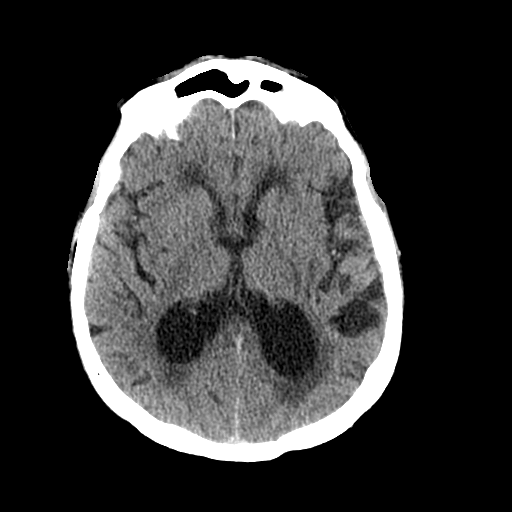
[im 19/34  bone]
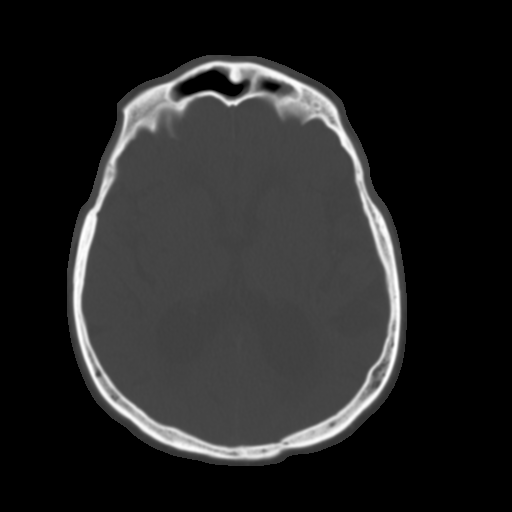
[im 22/34  brain]
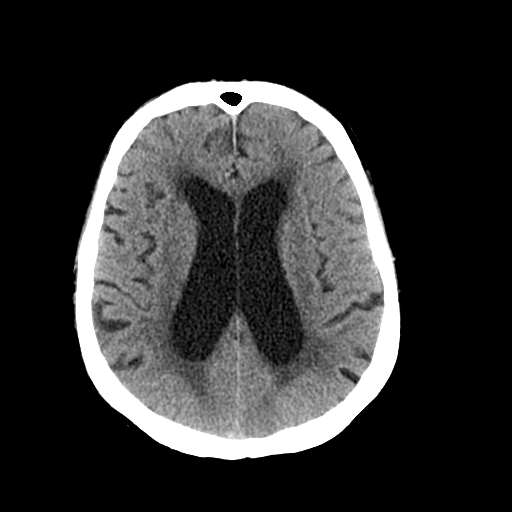
[im 26/34  brain]
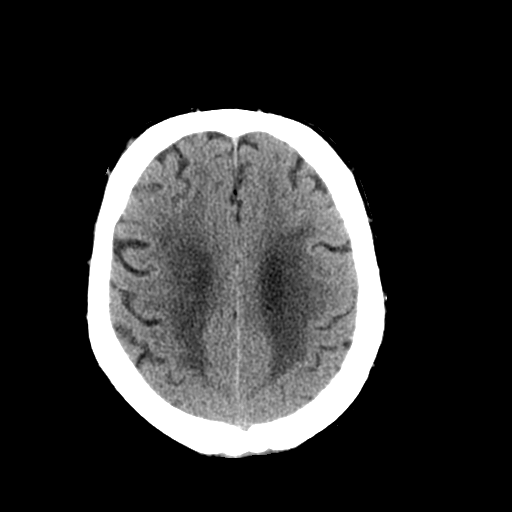
[im 31/34  brain]
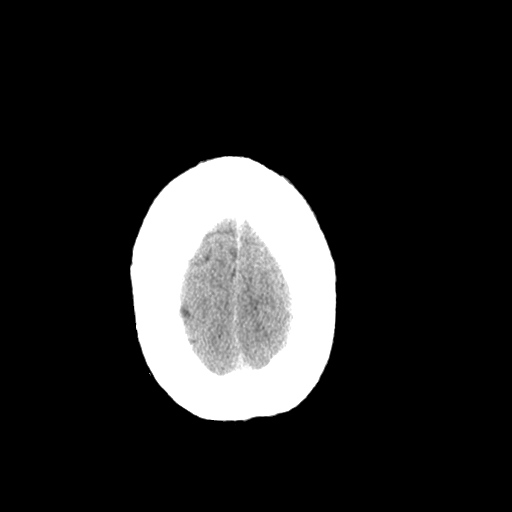

[Series 4: coronal · coronal · 0.33mm/px · 3 of 77 slices shown]
[im 26/77  brain]
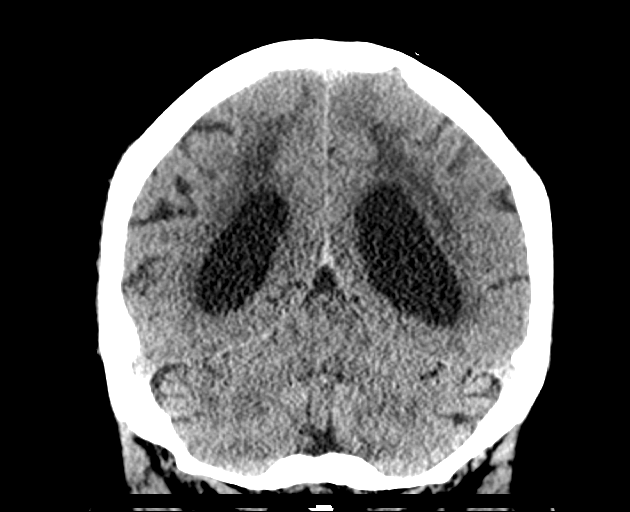
[im 34/77  brain]
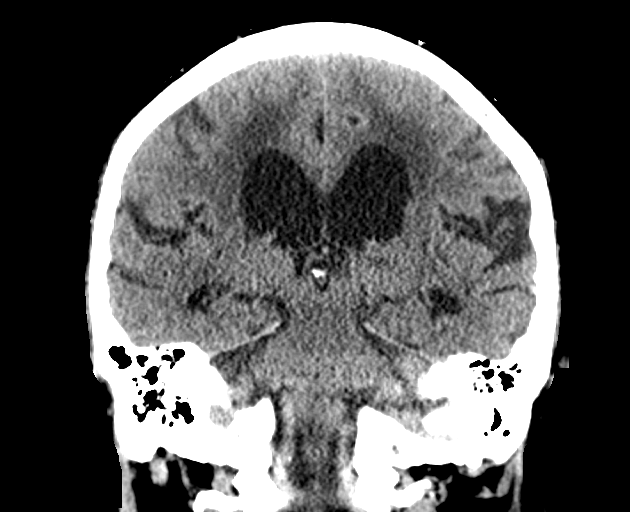
[im 43/77  brain]
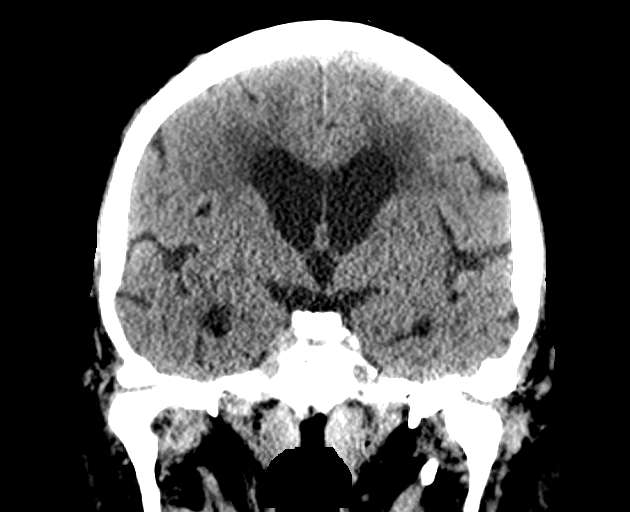

[Series 5: sagittal · sagittal · 0.33mm/px · 3 of 76 slices shown]
[im 26/76  brain]
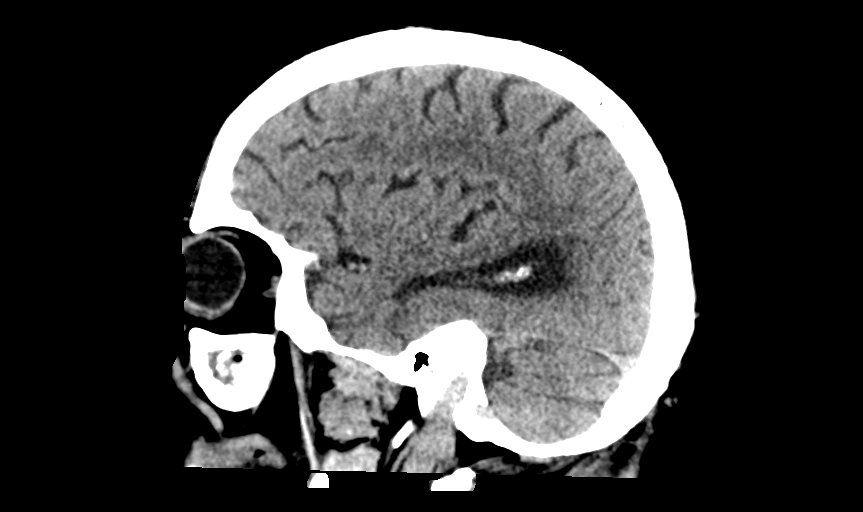
[im 38/76  brain]
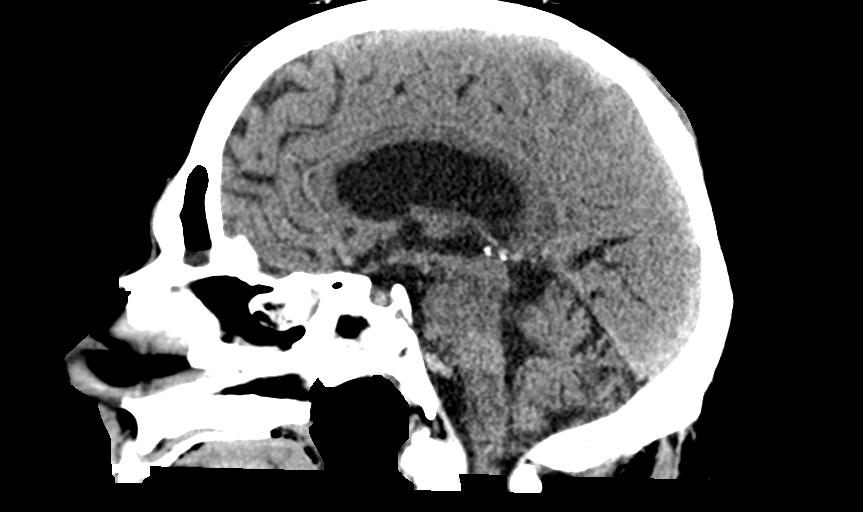
[im 51/76  brain]
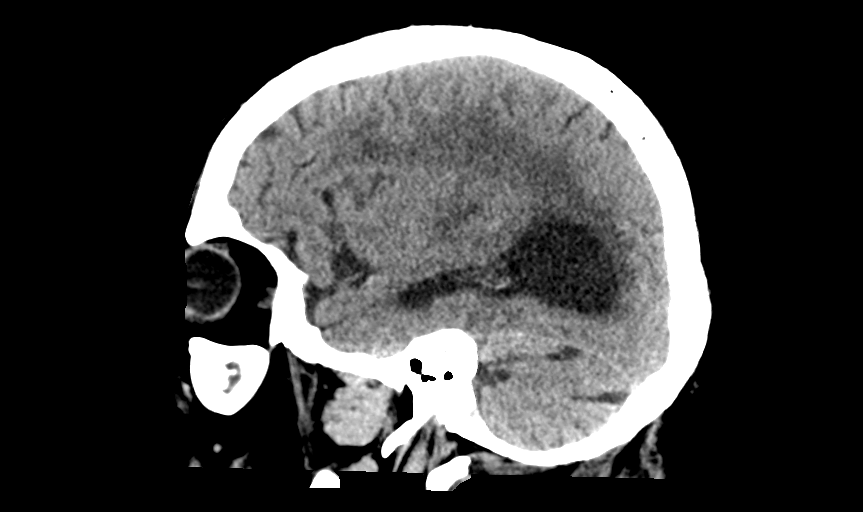

[14 of 47 positions shown; findings below may reference images not displayed]

FINDINGS: Brain: Ventricles and sulci are prominent compatible with atrophy.
Periventricular and subcortical white matter hypodensity compatible
with microvascular ischemic changes. No evidence for acute
cortically based infarct, intracranial hemorrhage, mass lesion or
mass-effect.

Vascular: Internal carotid arterial vascular calcifications.

Skull: Intact.  No evidence for displaced fracture.

Sinuses/Orbits: Chronic sinusitis with mucosal thickening. Mastoid
air cells unremarkable. Orbits are unremarkable.

Other: None.
IMPRESSION: No acute intracranial process.

Atrophy and chronic microvascular ischemic changes.

## 2018-01-25 IMAGING — CR DG RIBS W/ CHEST 3+V*R*
3 series · 3 of 3 positions shown · non-contrast
Comparison: Chest 06/06/2006

CLINICAL DATA: Right-sided chest pain. Right lower rib pain.
Patient fell. History of diabetes and hypertension.

EXAM:
RIGHT RIBS AND CHEST - 3+ VIEW

[w chest pa]
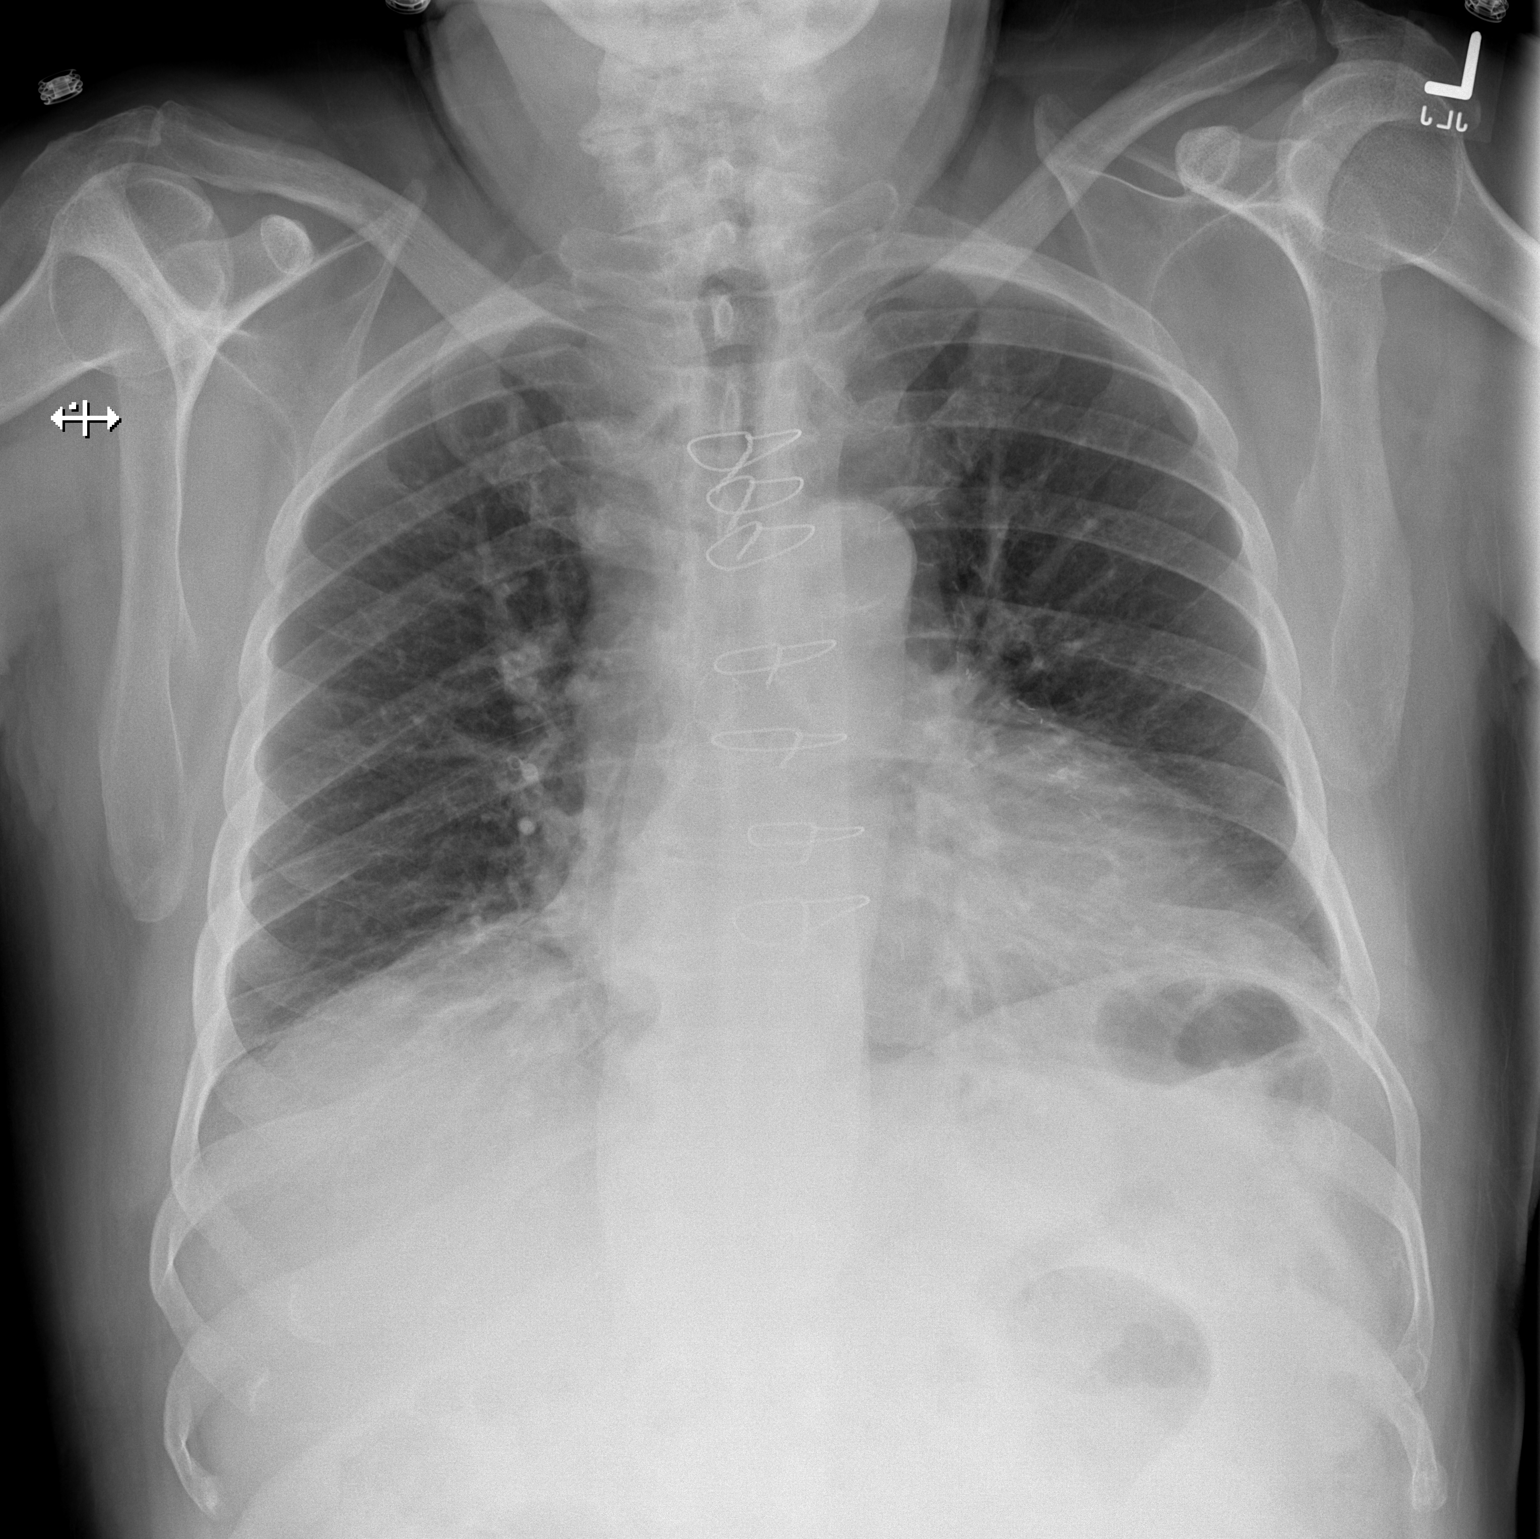

[w ribs ap upper right]
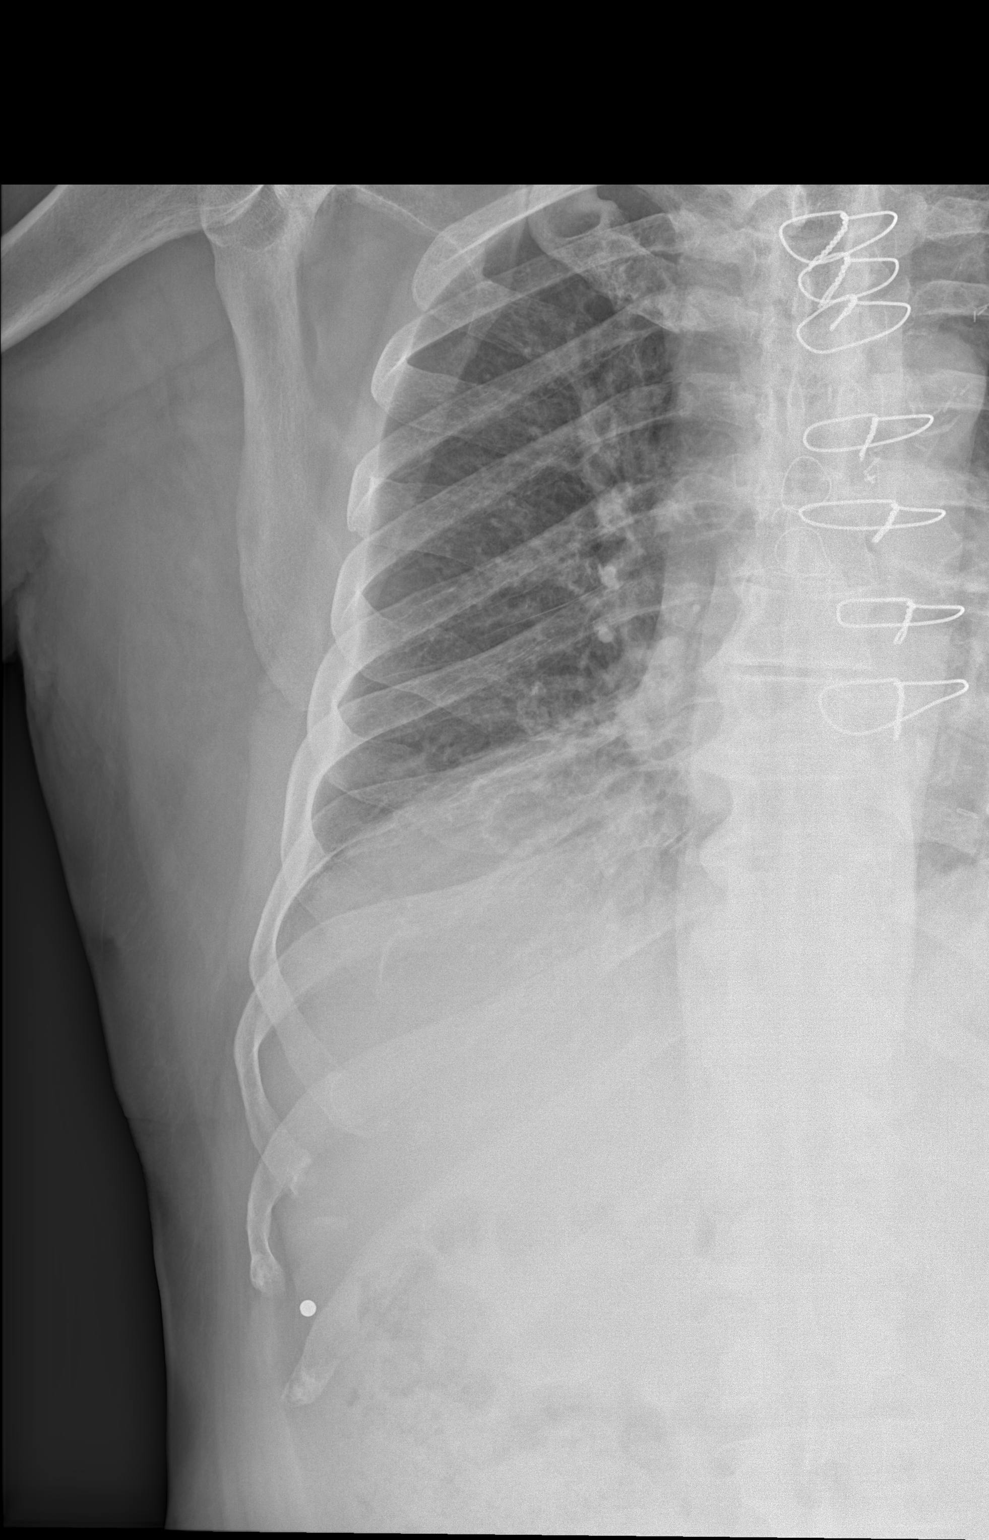

[w ribs obl right]
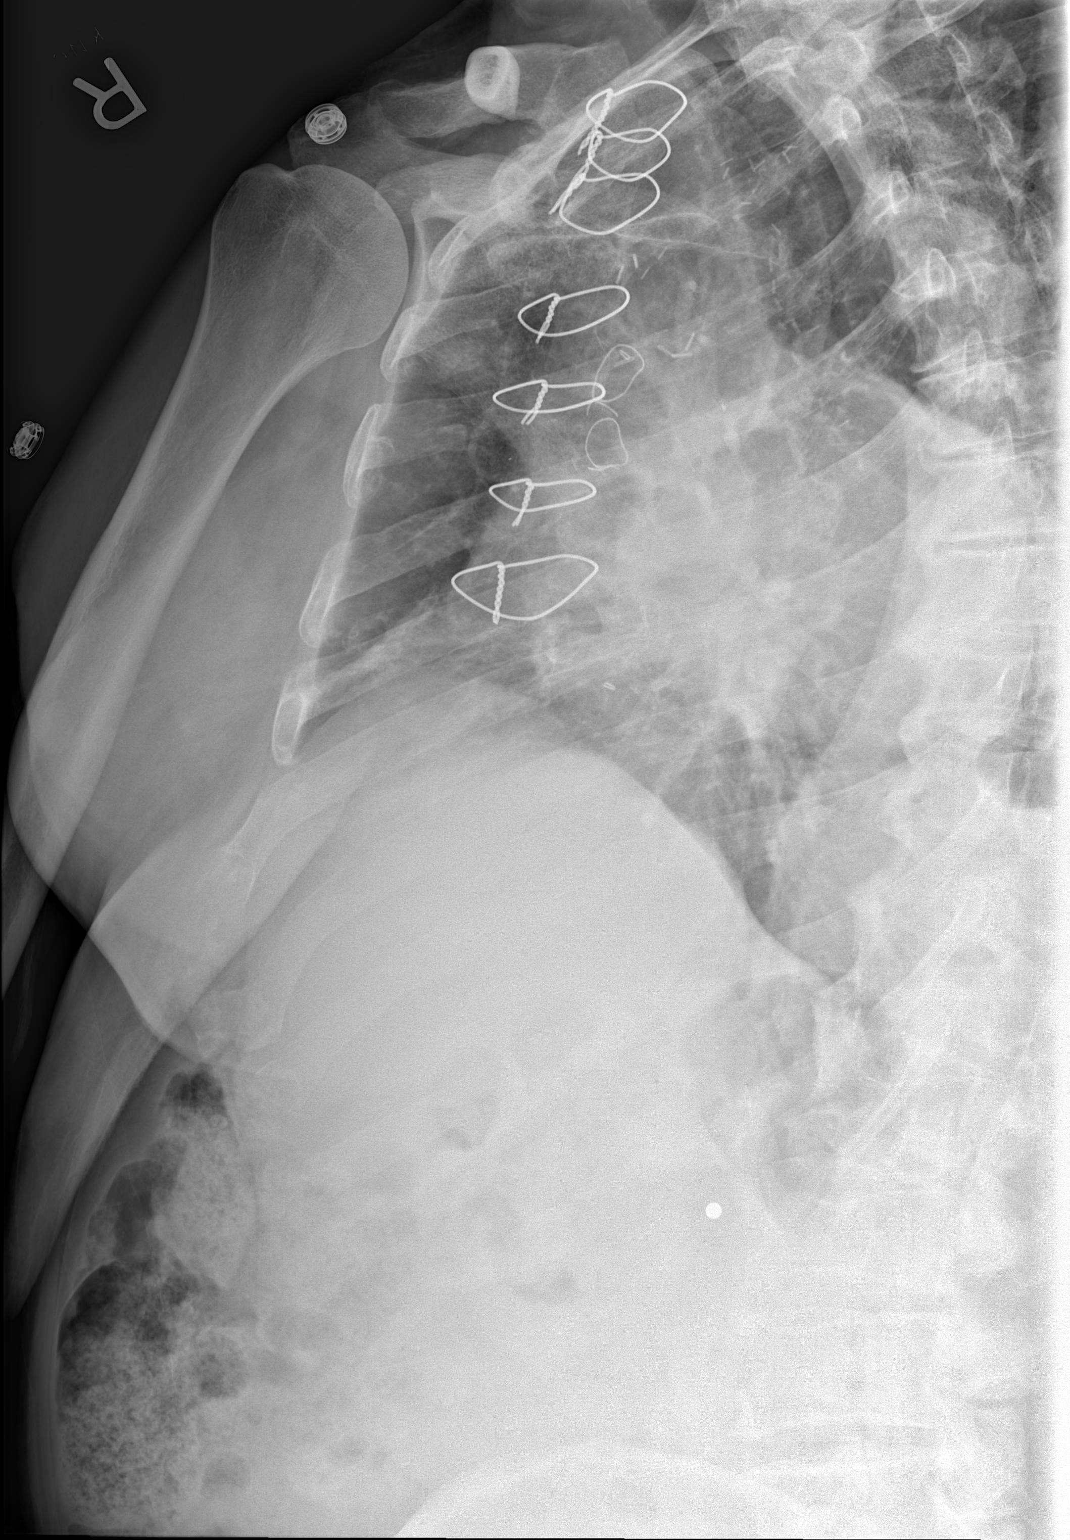

[3 of 3 positions shown; findings below may reference images not displayed]

FINDINGS: Postoperative changes in the mediastinum. Shallow inspiration with
atelectasis in the lung bases. Heart size and pulmonary vascularity
are normal for technique. No airspace disease or consolidation in
the lungs. No blunting of costophrenic angles. No pneumothorax.
Calcification of the aorta.

Right ribs appear intact. No acute fracture or displacement
identified. No focal bone lesion or bone destruction. Soft tissues
are unremarkable.
IMPRESSION: Shallow inspiration with linear atelectasis in the lung bases.
Negative right ribs.

## 2018-02-02 DIAGNOSIS — F418 Other specified anxiety disorders: Secondary | ICD-10-CM | POA: Diagnosis not present

## 2018-02-02 DIAGNOSIS — F419 Anxiety disorder, unspecified: Secondary | ICD-10-CM | POA: Insufficient documentation

## 2018-02-02 DIAGNOSIS — F329 Major depressive disorder, single episode, unspecified: Secondary | ICD-10-CM | POA: Diagnosis not present

## 2018-02-05 ENCOUNTER — Encounter (INDEPENDENT_AMBULATORY_CARE_PROVIDER_SITE_OTHER): Payer: Self-pay | Admitting: Orthopaedic Surgery

## 2018-02-05 ENCOUNTER — Ambulatory Visit (INDEPENDENT_AMBULATORY_CARE_PROVIDER_SITE_OTHER): Payer: Medicare HMO | Admitting: Orthopaedic Surgery

## 2018-02-05 DIAGNOSIS — M25551 Pain in right hip: Secondary | ICD-10-CM | POA: Diagnosis not present

## 2018-02-05 NOTE — Progress Notes (Signed)
The patient is anxious when I seen before.  He sent back from Dr. Sherley Bounds to evaluate his right hip.  He is a very active 83 year old gentleman who is on his feet all day long.  He has pain in his right hip but not so much in the groin.  It seems to be more in the lateral aspect and some of the roof.  It hurts when he first takes a step and then goes away rapidly and completely.  An MRI is actually done of his right hip and I have before my review.  On exam I can put him through the extremes of internal extra rotation of his right hip and compressive hip and the causes no pain at all.  I went over his MRI in detail and it does show edema in the roof of the acetabulum but the cartilage in the acetabulum and femoral head appears intact and is only mildly degenerative with no labral tear and no effusion.  I went over the findings with him and I do feel he is having a stress response in his acetabulum but I would not be inclined to just jump in doing hip replacement without trying an intra-articular injection first.  This is a stress response that could calm down with just time.  I explained this to him but he is adverse to injections and the fact the injection has not helped in the past and relates to his back and is frustrated the pain that he has an atretic film hip model explained in detail that I do not feel that he should try hip replacement right off the bat without trying at least an injection that could hopefully be diagnostic and therapeutic.  Again I went over the MRI in detail with him showing him where the edema is.  Hopefully get this injection set up as soon as possible to give him some relief and to help better ascertain what the next steps may be.  I did give him a handout on hip replacement surgery but again I am not convinced that that is the right operation for him at the moment without trying something first.

## 2018-02-14 ENCOUNTER — Encounter (INDEPENDENT_AMBULATORY_CARE_PROVIDER_SITE_OTHER): Payer: Self-pay | Admitting: Physical Medicine and Rehabilitation

## 2018-02-14 ENCOUNTER — Ambulatory Visit (INDEPENDENT_AMBULATORY_CARE_PROVIDER_SITE_OTHER): Payer: Medicare HMO | Admitting: Physical Medicine and Rehabilitation

## 2018-02-14 ENCOUNTER — Ambulatory Visit (INDEPENDENT_AMBULATORY_CARE_PROVIDER_SITE_OTHER): Payer: Self-pay

## 2018-02-14 DIAGNOSIS — M25551 Pain in right hip: Secondary | ICD-10-CM | POA: Diagnosis not present

## 2018-02-14 MED ORDER — TRIAMCINOLONE ACETONIDE 40 MG/ML IJ SUSP
80.0000 mg | INTRAMUSCULAR | Status: AC | PRN
  Administered 2018-02-14: 80 mg via INTRA_ARTICULAR

## 2018-02-14 MED ORDER — BUPIVACAINE HCL 0.5 % IJ SOLN
3.0000 mL | INTRAMUSCULAR | Status: AC | PRN
  Administered 2018-02-14: 3 mL via INTRA_ARTICULAR

## 2018-02-14 NOTE — Progress Notes (Signed)
 .  Numeric Pain Rating Scale and Functional Assessment Average Pain 6   In the last MONTH (on 0-10 scale) has pain interfered with the following?  1. General activity like being  able to carry out your everyday physical activities such as walking, climbing stairs, carrying groceries, or moving a chair?  Rating(4)    -Dye Allergies.  

## 2018-02-14 NOTE — Patient Instructions (Signed)

## 2018-02-14 NOTE — Progress Notes (Signed)
   [name] - 83 y.o. male MRN [id number]  Date of birth: [birth year]  Office Visit Note: Visit Date: 02/14/2018 PCP: Marton Redwood, MD Referred by: Marton Redwood, MD  Subjective: Chief Complaint  Patient presents with  . Right Hip - Pain   HPI:  [name] is a 83 y.o. male who comes in today For planned right intra-articular anesthetic hip arthrogram with fluoroscopic guidance at the direction of Dr. Jean Rosenthal.  He reports chronic many years of pain in the right hip and groin.  He reports worsening with heavy lifting and standing for prolonged periods.  He has used Tylenol for some pain relief but no other treatments have helped at all.  He has not had prior intra-articular injection.  He has had lumbar spine issues in the past and has seen Dr. Sherley Bounds is actually had a series and injections of his back with cortisone that with no relief.    ROS Otherwise per HPI.  Assessment & Plan: Visit Diagnoses:  1. Pain in right hip     Plan: Findings:  Fairly successful intra-articular injection but with sub-standard flow of contrast producing arthrogram.  Also unfortunately patient does not get much pain until he is up standing and moving for a long period of time so diagnostically there is no way to tell to get any relief right away.  He did not really tolerate the injection very well even from the beginning with using ChloraPrep he was very sensitive to the cold on his skin to the point where it was really problematic.  Numbing the skin with a 27-gauge needle was also problematic and caused him a great deal of pain.  Patient did let us complete the injection again with decent but not great flow contrast.    Meds & Orders: No orders of the defined types were placed in this encounter.   Orders Placed This Encounter  Procedures  . Large Joint Inj: R hip joint  . XR C-ARM NO REPORT    Follow-up: Return for Dr. Ninfa Linden.   Procedures: Large Joint Inj: R hip joint on  02/14/2018 9:06 AM Indications: pain and diagnostic evaluation Details: 22 G needle, anterior approach  Arthrogram: Yes  Medications: 3 mL bupivacaine 0.5 %; 80 mg triamcinolone acetonide 40 MG/ML Outcome: tolerated well, no immediate complications  Arthrogram demonstrated good but not great flow of contrast.  The patient had an uncommon pain reaction to the injection even with numbing the skin and completing the injection.  Diagnostically is hard to know if he got much relief yet as he is going to have to go and stand and do some things for several hours before the pain is usually noticeable. Procedure, treatment alternatives, risks and benefits explained, specific risks discussed. Consent was given by the patient. Immediately prior to procedure a time out was called to verify the correct patient, procedure, equipment, support staff and site/side marked as required. Patient was prepped and draped in the usual sterile fashion.      No notes on file   Clinical History: No specialty comments available.     Objective:  VS:  HT:    WT:   BMI:     BP:   HR: bpm  TEMP: ( )  RESP:  Physical Exam  Ortho Exam Imaging: Xr C-arm No Report  Result Date: 02/14/2018 Please see Notes tab for imaging impression.

## 2018-02-19 DIAGNOSIS — F329 Major depressive disorder, single episode, unspecified: Secondary | ICD-10-CM | POA: Diagnosis not present

## 2018-02-19 DIAGNOSIS — F418 Other specified anxiety disorders: Secondary | ICD-10-CM | POA: Diagnosis not present

## 2018-02-21 ENCOUNTER — Encounter (INDEPENDENT_AMBULATORY_CARE_PROVIDER_SITE_OTHER): Payer: Self-pay | Admitting: Orthopaedic Surgery

## 2018-02-21 ENCOUNTER — Ambulatory Visit (INDEPENDENT_AMBULATORY_CARE_PROVIDER_SITE_OTHER): Payer: Medicare HMO | Admitting: Orthopaedic Surgery

## 2018-02-21 DIAGNOSIS — M25551 Pain in right hip: Secondary | ICD-10-CM | POA: Diagnosis not present

## 2018-02-21 NOTE — Progress Notes (Signed)
The patient is returning after having an intra-articular injection in his right hip joint to see how he responds to a steroid in the hip itself.  He is a very active 83 year old gentleman who been having some hip pain.  His MRI did show edema in the roof of the acetabulum.  The injection was done a week ago.  He said it is been very helpful and he is very happy with the results.  On exam I compress his right hip and put him through range of motion causes no problems at all.  He is not walking with a limp and not using assistive device.  Hopefully this will be both therapeutic and diagnostic injection and he will do well.  All question concerns were answered and addressed.  Follow-up will be as needed.

## 2018-03-08 DIAGNOSIS — F418 Other specified anxiety disorders: Secondary | ICD-10-CM | POA: Diagnosis not present

## 2018-03-08 DIAGNOSIS — F329 Major depressive disorder, single episode, unspecified: Secondary | ICD-10-CM | POA: Diagnosis not present

## 2018-04-02 DIAGNOSIS — F329 Major depressive disorder, single episode, unspecified: Secondary | ICD-10-CM | POA: Diagnosis not present

## 2018-04-02 DIAGNOSIS — F418 Other specified anxiety disorders: Secondary | ICD-10-CM | POA: Diagnosis not present

## 2018-04-27 DIAGNOSIS — Z6828 Body mass index (BMI) 28.0-28.9, adult: Secondary | ICD-10-CM | POA: Diagnosis not present

## 2018-04-27 DIAGNOSIS — F3289 Other specified depressive episodes: Secondary | ICD-10-CM | POA: Diagnosis not present

## 2018-04-27 DIAGNOSIS — R279 Unspecified lack of coordination: Secondary | ICD-10-CM | POA: Diagnosis not present

## 2018-04-27 DIAGNOSIS — E1129 Type 2 diabetes mellitus with other diabetic kidney complication: Secondary | ICD-10-CM | POA: Diagnosis not present

## 2018-04-27 DIAGNOSIS — E782 Mixed hyperlipidemia: Secondary | ICD-10-CM | POA: Diagnosis not present

## 2018-04-27 DIAGNOSIS — N183 Chronic kidney disease, stage 3 (moderate): Secondary | ICD-10-CM | POA: Diagnosis not present

## 2018-04-27 DIAGNOSIS — I25810 Atherosclerosis of coronary artery bypass graft(s) without angina pectoris: Secondary | ICD-10-CM | POA: Diagnosis not present

## 2018-04-27 DIAGNOSIS — I10 Essential (primary) hypertension: Secondary | ICD-10-CM | POA: Diagnosis not present

## 2018-05-02 DIAGNOSIS — F418 Other specified anxiety disorders: Secondary | ICD-10-CM | POA: Diagnosis not present

## 2018-05-02 DIAGNOSIS — F329 Major depressive disorder, single episode, unspecified: Secondary | ICD-10-CM | POA: Diagnosis not present

## 2018-05-22 DIAGNOSIS — F329 Major depressive disorder, single episode, unspecified: Secondary | ICD-10-CM | POA: Diagnosis not present

## 2018-05-22 DIAGNOSIS — F418 Other specified anxiety disorders: Secondary | ICD-10-CM | POA: Diagnosis not present

## 2018-05-31 DIAGNOSIS — R279 Unspecified lack of coordination: Secondary | ICD-10-CM | POA: Diagnosis not present

## 2018-06-08 DIAGNOSIS — Z6828 Body mass index (BMI) 28.0-28.9, adult: Secondary | ICD-10-CM | POA: Diagnosis not present

## 2018-06-08 DIAGNOSIS — S50812A Abrasion of left forearm, initial encounter: Secondary | ICD-10-CM | POA: Diagnosis not present

## 2018-06-12 DIAGNOSIS — F418 Other specified anxiety disorders: Secondary | ICD-10-CM | POA: Diagnosis not present

## 2018-06-12 DIAGNOSIS — F329 Major depressive disorder, single episode, unspecified: Secondary | ICD-10-CM | POA: Diagnosis not present

## 2018-07-04 DIAGNOSIS — F418 Other specified anxiety disorders: Secondary | ICD-10-CM | POA: Diagnosis not present

## 2018-07-04 DIAGNOSIS — F329 Major depressive disorder, single episode, unspecified: Secondary | ICD-10-CM | POA: Diagnosis not present

## 2018-07-09 ENCOUNTER — Encounter: Payer: Self-pay | Admitting: Physical Therapy

## 2018-07-09 NOTE — Therapy (Signed)
[facility] [address] [place], [place], [address] Phone: [phone]   Fax:  [phone]  Patient Details  Name: [name] MRN: [id number] Date of Birth: 06/18/1934 Referring Provider:  No ref. provider found  Encounter Date: 07/09/2018   PHYSICAL THERAPY DISCHARGE SUMMARY  Visits from Start of Care: 2 (last visit 10/13/2017)  Current functional level related to goals / functional outcomes: See eval   Remaining deficits: See eval   Education / Equipment: Educated in Occupational hygienist test and initial HEP-unable to fully address, due to pt not returning since second PT visit.  Plan: Patient agrees to discharge.  Patient goals were not met. Patient is being discharged due to not returning since the last visit.  ?????       MARRIOTT,AMY W. 07/09/2018, 11:08 AM Frazier Butt., PT [facility] [address] [place], [place], [address] Phone: [phone]   Fax:  [phone]

## 2018-09-04 DIAGNOSIS — I10 Essential (primary) hypertension: Secondary | ICD-10-CM | POA: Diagnosis not present

## 2018-09-04 DIAGNOSIS — E782 Mixed hyperlipidemia: Secondary | ICD-10-CM | POA: Diagnosis not present

## 2018-09-04 DIAGNOSIS — E1129 Type 2 diabetes mellitus with other diabetic kidney complication: Secondary | ICD-10-CM | POA: Diagnosis not present

## 2018-09-04 DIAGNOSIS — Z125 Encounter for screening for malignant neoplasm of prostate: Secondary | ICD-10-CM | POA: Diagnosis not present

## 2018-09-05 DIAGNOSIS — R82998 Other abnormal findings in urine: Secondary | ICD-10-CM | POA: Diagnosis not present

## 2018-09-05 DIAGNOSIS — I10 Essential (primary) hypertension: Secondary | ICD-10-CM | POA: Diagnosis not present

## 2018-09-11 DIAGNOSIS — I10 Essential (primary) hypertension: Secondary | ICD-10-CM | POA: Diagnosis not present

## 2018-09-11 DIAGNOSIS — E782 Mixed hyperlipidemia: Secondary | ICD-10-CM | POA: Diagnosis not present

## 2018-09-11 DIAGNOSIS — F3341 Major depressive disorder, recurrent, in partial remission: Secondary | ICD-10-CM | POA: Diagnosis not present

## 2018-09-11 DIAGNOSIS — I25810 Atherosclerosis of coronary artery bypass graft(s) without angina pectoris: Secondary | ICD-10-CM | POA: Diagnosis not present

## 2018-09-11 DIAGNOSIS — R809 Proteinuria, unspecified: Secondary | ICD-10-CM | POA: Diagnosis not present

## 2018-09-11 DIAGNOSIS — N183 Chronic kidney disease, stage 3 (moderate): Secondary | ICD-10-CM | POA: Diagnosis not present

## 2018-09-11 DIAGNOSIS — M25551 Pain in right hip: Secondary | ICD-10-CM | POA: Diagnosis not present

## 2018-09-11 DIAGNOSIS — Z0000 Encounter for general adult medical examination without abnormal findings: Secondary | ICD-10-CM | POA: Diagnosis not present

## 2018-09-11 DIAGNOSIS — E1129 Type 2 diabetes mellitus with other diabetic kidney complication: Secondary | ICD-10-CM | POA: Diagnosis not present

## 2018-10-03 DIAGNOSIS — Z0000 Encounter for general adult medical examination without abnormal findings: Secondary | ICD-10-CM | POA: Diagnosis not present

## 2018-10-24 DIAGNOSIS — F418 Other specified anxiety disorders: Secondary | ICD-10-CM | POA: Diagnosis not present

## 2018-10-24 DIAGNOSIS — F3341 Major depressive disorder, recurrent, in partial remission: Secondary | ICD-10-CM | POA: Diagnosis not present

## 2018-11-19 DIAGNOSIS — F3341 Major depressive disorder, recurrent, in partial remission: Secondary | ICD-10-CM | POA: Diagnosis not present

## 2018-11-19 DIAGNOSIS — F418 Other specified anxiety disorders: Secondary | ICD-10-CM | POA: Diagnosis not present

## 2018-12-17 DIAGNOSIS — R351 Nocturia: Secondary | ICD-10-CM | POA: Diagnosis not present

## 2018-12-17 DIAGNOSIS — E1129 Type 2 diabetes mellitus with other diabetic kidney complication: Secondary | ICD-10-CM | POA: Diagnosis not present

## 2018-12-17 DIAGNOSIS — S40819A Abrasion of unspecified upper arm, initial encounter: Secondary | ICD-10-CM | POA: Diagnosis not present

## 2018-12-20 DIAGNOSIS — F3341 Major depressive disorder, recurrent, in partial remission: Secondary | ICD-10-CM | POA: Diagnosis not present

## 2018-12-20 DIAGNOSIS — F418 Other specified anxiety disorders: Secondary | ICD-10-CM | POA: Diagnosis not present

## 2019-01-14 DIAGNOSIS — L821 Other seborrheic keratosis: Secondary | ICD-10-CM | POA: Diagnosis not present

## 2019-01-14 DIAGNOSIS — L570 Actinic keratosis: Secondary | ICD-10-CM | POA: Diagnosis not present

## 2019-01-15 DIAGNOSIS — E1122 Type 2 diabetes mellitus with diabetic chronic kidney disease: Secondary | ICD-10-CM | POA: Diagnosis not present

## 2019-01-15 DIAGNOSIS — E782 Mixed hyperlipidemia: Secondary | ICD-10-CM | POA: Diagnosis not present

## 2019-01-15 DIAGNOSIS — F3341 Major depressive disorder, recurrent, in partial remission: Secondary | ICD-10-CM | POA: Diagnosis not present

## 2019-01-15 DIAGNOSIS — E1129 Type 2 diabetes mellitus with other diabetic kidney complication: Secondary | ICD-10-CM | POA: Diagnosis not present

## 2019-01-15 DIAGNOSIS — E669 Obesity, unspecified: Secondary | ICD-10-CM | POA: Diagnosis not present

## 2019-01-15 DIAGNOSIS — N183 Chronic kidney disease, stage 3 (moderate): Secondary | ICD-10-CM | POA: Diagnosis not present

## 2019-01-15 DIAGNOSIS — Z23 Encounter for immunization: Secondary | ICD-10-CM | POA: Diagnosis not present

## 2019-01-15 DIAGNOSIS — I499 Cardiac arrhythmia, unspecified: Secondary | ICD-10-CM | POA: Diagnosis not present

## 2019-01-15 DIAGNOSIS — I10 Essential (primary) hypertension: Secondary | ICD-10-CM | POA: Diagnosis not present

## 2019-01-15 DIAGNOSIS — I25810 Atherosclerosis of coronary artery bypass graft(s) without angina pectoris: Secondary | ICD-10-CM | POA: Diagnosis not present

## 2019-01-15 DIAGNOSIS — R6884 Jaw pain: Secondary | ICD-10-CM | POA: Diagnosis not present

## 2019-01-18 DIAGNOSIS — F418 Other specified anxiety disorders: Secondary | ICD-10-CM | POA: Diagnosis not present

## 2019-01-18 DIAGNOSIS — F3341 Major depressive disorder, recurrent, in partial remission: Secondary | ICD-10-CM | POA: Diagnosis not present

## 2019-01-24 DIAGNOSIS — Z23 Encounter for immunization: Secondary | ICD-10-CM | POA: Diagnosis not present

## 2019-01-29 DIAGNOSIS — H5212 Myopia, left eye: Secondary | ICD-10-CM | POA: Diagnosis not present

## 2019-01-29 DIAGNOSIS — Z961 Presence of intraocular lens: Secondary | ICD-10-CM | POA: Diagnosis not present

## 2019-01-29 DIAGNOSIS — H33302 Unspecified retinal break, left eye: Secondary | ICD-10-CM | POA: Diagnosis not present

## 2019-01-29 DIAGNOSIS — H524 Presbyopia: Secondary | ICD-10-CM | POA: Diagnosis not present

## 2019-01-29 DIAGNOSIS — H26491 Other secondary cataract, right eye: Secondary | ICD-10-CM | POA: Diagnosis not present

## 2019-01-29 DIAGNOSIS — H52223 Regular astigmatism, bilateral: Secondary | ICD-10-CM | POA: Diagnosis not present

## 2019-01-29 DIAGNOSIS — E119 Type 2 diabetes mellitus without complications: Secondary | ICD-10-CM | POA: Diagnosis not present

## 2019-02-11 DIAGNOSIS — F3341 Major depressive disorder, recurrent, in partial remission: Secondary | ICD-10-CM | POA: Diagnosis not present

## 2019-02-11 DIAGNOSIS — F418 Other specified anxiety disorders: Secondary | ICD-10-CM | POA: Diagnosis not present

## 2019-03-06 DIAGNOSIS — F3341 Major depressive disorder, recurrent, in partial remission: Secondary | ICD-10-CM | POA: Diagnosis not present

## 2019-03-06 DIAGNOSIS — F418 Other specified anxiety disorders: Secondary | ICD-10-CM | POA: Diagnosis not present

## 2019-04-04 ENCOUNTER — Ambulatory Visit (INDEPENDENT_AMBULATORY_CARE_PROVIDER_SITE_OTHER): Payer: Medicare HMO | Admitting: Orthopaedic Surgery

## 2019-04-04 ENCOUNTER — Other Ambulatory Visit: Payer: Self-pay

## 2019-04-04 DIAGNOSIS — M25551 Pain in right hip: Secondary | ICD-10-CM | POA: Diagnosis not present

## 2019-04-04 NOTE — Progress Notes (Signed)
Office Visit Note   Patient: [name]           Date of Birth: Jun 27, 1934           MRN: [id number] Visit Date: 04/04/2019              Requested by: Marton Redwood, MD [address] [place],  [place] [address] PCP: Marton Redwood, MD   Assessment & Plan: Visit Diagnoses:  1. Pain in right hip     Plan: Since he has such minimal pain I would not recommend a steroid injection right now.  If it does not close through his trip and he would like to rethink this and schedule an injection I would have this done either by Dr. Ernestina Patches on the right fluoroscopy or Dr. Junius Roads by ultrasound.  However, if he is not really hurting bad and his exam is just as it is now I would not recommend any injection at all.  All question concerns were answered and addressed.  Follow-up is as needed.  Follow-Up Instructions: Return if symptoms worsen or fail to improve.   Orders:  No orders of the defined types were placed in this encounter.  No orders of the defined types were placed in this encounter.     Procedures: No procedures performed   Clinical Data: No additional findings.   Subjective: Chief Complaint  Patient presents with  . Lower Back - Pain, Follow-up  The patient actually comes in today for his right hip and not his back.  In October 2019 he had an intra-articular steroid injection in the right hip and he did very well.  That was done under fluoroscopy by Dr. Ernestina Patches.  His MRI findings showed some edema in the acetabulum.  He is a very active 84 year old gentleman.  He is going to [place] the second week of February and he felt like he may need a new hip injection.  However his hip pain is been minimal.  He says is only present in the morning when he first gets up and Tylenol takes care of it.  Sometimes he walks with a slight limp.  He has had no other acute changes in medical status.  He walks without an assistive device. HPI  Review of Systems He currently denies any headache,  chest pain, shortness of breath, fever, chills, nausea, vomiting  Objective: Vital Signs: There were no vitals taken for this visit.  Physical Exam He is alert and orient x3 and in no acute distress Ortho Exam Examination of his right hip shows good internal and external rotation with no significant pain.  Compression of the hip causes no significant pain. Specialty Comments:  No specialty comments available.  Imaging: No results found.   PMFS History: Patient Active Problem List   Diagnosis Date Noted  . Personal history of colonic adenomas 03/26/2013  . OBSTRUCTIVE SLEEP APNEA 10/30/2008  . HYPERLIPIDEMIA-MIXED 07/01/2008  . HYPERTENSION, BENIGN 07/01/2008  . CAD, NATIVE VESSEL 07/01/2008  . FATIGUE / MALAISE 07/01/2008  . EDEMA 07/01/2008   Past Medical History:  Diagnosis Date  . Acute renal failure ([place])    secondary to nephrotic syndrome post cath, resolved with steroids  . CAD (coronary artery disease) January 2008   LIMA to LAD, sequential SVG to first, second, and third OM's, sequential SVG to mid RCA and PDA   . Diabetes mellitus without complication ([place])   . HTN (hypertension)   . Hx of adenomatous colonic polyps   . Hyperlipidemia  Family History  Problem Relation Age of Onset  . Heart attack Mother   . Heart disease Sister   . Clotting disorder Father        died of renal disease  . Colon cancer Neg Hx     Past Surgical History:  Procedure Laterality Date  . COLONOSCOPY W/ BIOPSIES AND POLYPECTOMY    . CORONARY ARTERY BYPASS GRAFT  2008   VESSELS X6  . UMBILICAL HERNIA REPAIR     Social History   Occupational History  . Occupation: Surveyor, mining  Tobacco Use  . Smoking status: Never Smoker  . Smokeless tobacco: Never Used  Substance and Sexual Activity  . Alcohol use: No    Alcohol/week: 1.0 standard drinks    Types: 1 Glasses of wine per week  . Drug use: No  . Sexual activity: Not on file

## 2019-05-02 DIAGNOSIS — R350 Frequency of micturition: Secondary | ICD-10-CM | POA: Diagnosis not present

## 2019-05-02 DIAGNOSIS — N401 Enlarged prostate with lower urinary tract symptoms: Secondary | ICD-10-CM | POA: Diagnosis not present

## 2019-05-02 DIAGNOSIS — N3941 Urge incontinence: Secondary | ICD-10-CM | POA: Diagnosis not present

## 2019-05-02 DIAGNOSIS — R3915 Urgency of urination: Secondary | ICD-10-CM | POA: Diagnosis not present

## 2019-05-20 DIAGNOSIS — F3341 Major depressive disorder, recurrent, in partial remission: Secondary | ICD-10-CM | POA: Diagnosis not present

## 2019-05-20 DIAGNOSIS — E1129 Type 2 diabetes mellitus with other diabetic kidney complication: Secondary | ICD-10-CM | POA: Diagnosis not present

## 2019-05-20 DIAGNOSIS — R3915 Urgency of urination: Secondary | ICD-10-CM | POA: Diagnosis not present

## 2019-05-20 DIAGNOSIS — E782 Mixed hyperlipidemia: Secondary | ICD-10-CM | POA: Diagnosis not present

## 2019-05-20 DIAGNOSIS — E669 Obesity, unspecified: Secondary | ICD-10-CM | POA: Diagnosis not present

## 2019-05-20 DIAGNOSIS — I10 Essential (primary) hypertension: Secondary | ICD-10-CM | POA: Diagnosis not present

## 2019-05-20 DIAGNOSIS — N1831 Chronic kidney disease, stage 3a: Secondary | ICD-10-CM | POA: Diagnosis not present

## 2019-05-20 DIAGNOSIS — I25810 Atherosclerosis of coronary artery bypass graft(s) without angina pectoris: Secondary | ICD-10-CM | POA: Diagnosis not present

## 2019-06-18 DIAGNOSIS — H61002 Unspecified perichondritis of left external ear: Secondary | ICD-10-CM | POA: Diagnosis not present

## 2019-06-18 DIAGNOSIS — L080 Pyoderma: Secondary | ICD-10-CM | POA: Diagnosis not present

## 2019-07-16 DIAGNOSIS — D485 Neoplasm of uncertain behavior of skin: Secondary | ICD-10-CM | POA: Diagnosis not present

## 2019-07-16 DIAGNOSIS — C44219 Basal cell carcinoma of skin of left ear and external auricular canal: Secondary | ICD-10-CM | POA: Diagnosis not present

## 2019-08-15 DIAGNOSIS — H52223 Regular astigmatism, bilateral: Secondary | ICD-10-CM | POA: Diagnosis not present

## 2019-08-15 DIAGNOSIS — H5212 Myopia, left eye: Secondary | ICD-10-CM | POA: Diagnosis not present

## 2019-08-15 DIAGNOSIS — H26491 Other secondary cataract, right eye: Secondary | ICD-10-CM | POA: Diagnosis not present

## 2019-08-15 DIAGNOSIS — H524 Presbyopia: Secondary | ICD-10-CM | POA: Diagnosis not present

## 2019-09-11 ENCOUNTER — Other Ambulatory Visit: Payer: Self-pay

## 2019-09-11 ENCOUNTER — Ambulatory Visit (INDEPENDENT_AMBULATORY_CARE_PROVIDER_SITE_OTHER): Payer: Medicare HMO

## 2019-09-11 ENCOUNTER — Ambulatory Visit: Payer: Medicare HMO | Admitting: Orthopaedic Surgery

## 2019-09-11 DIAGNOSIS — M542 Cervicalgia: Secondary | ICD-10-CM

## 2019-09-11 DIAGNOSIS — M545 Low back pain, unspecified: Secondary | ICD-10-CM

## 2019-09-11 DIAGNOSIS — M5442 Lumbago with sciatica, left side: Secondary | ICD-10-CM

## 2019-09-11 DIAGNOSIS — G8929 Other chronic pain: Secondary | ICD-10-CM

## 2019-09-11 NOTE — Progress Notes (Signed)
Office Visit Note   Patient: [name]           Date of Birth: [birth year]           MRN: [id number] Visit Date: 09/11/2019              Requested by: Marton Redwood, MD [address] [place],  [place] [address] PCP: Marton Redwood, MD   Assessment & Plan: Visit Diagnoses:  1. Low back pain, unspecified back pain laterality, unspecified chronicity, unspecified whether sciatica present   2. Neck pain   3. Chronic left-sided low back pain with left-sided sciatica     Plan: At this point we do need to obtain an MRI of the lumbar spine to assess the degree of nerve compression and see if this is worsening to the left side.  Then we will be able to hopefully set him up for repeat epidural steroid injection by Dr. Ernestina Patches.  All questions concerns were answered addressed.  Offered him physical therapy for his neck but he says that he will just wait to see what the MRI shows of his back 1st.  Follow-Up Instructions: Return in about 2 weeks (around 09/25/2019).   Orders:  Orders Placed This Encounter  Procedures  . XR Lumbar Spine 2-3 Views  . XR Cervical Spine 2 or 3 views   No orders of the defined types were placed in this encounter.     Procedures: No procedures performed   Clinical Data: No additional findings.   Subjective: Chief Complaint  Patient presents with  . Lower Back - Pain  The patient comes in today with 2 chief complaints.  He has been having neck pain but also low back pain with left-sided sciatica.  He had an intervention by Dr. Ernestina Patches years ago in the lumbar spine which helped greatly.  He has not had interventions since then.  We do not have those records anymore because it was before epic and before we were affiliated with [facility].  He is also reported neck stiffness and pain but is to the lateral aspects of his neck.  He denies any numbness and tingling in his arms or weakness in his arms or hands.  He denies any weakness in his legs but he does  report sciatic type symptoms going down his left backside to behind his knee.  He is also been dealing with peripheral edema.  He is 85 years old.  He is on Lasix and does have follow-up soon with his primary care physician to address his peripheral edema.  HPI  Review of Systems He currently denies any chest pain or shortness of breath.  Denies any fever, chills, nausea, vomiting  Objective: Vital Signs: There were no vitals taken for this visit.  Physical Exam He is alert and oriented x3 and in no acute distress Ortho Exam Examination does show bilateral lower extremity pitting edema.  He has a positive straight leg raise to the left side and pain with flexion extension lumbar spine.  There is pain in the paraspinal muscles to the left side.  His right lower extremity exam is normal other than pitting edema.  He is does have subjective decrease sensation down his left leg.  Examination of his cervical spine shows full range of motion of his cervical spine.  There is no midline tenderness but only paraspinal muscular tenderness to the left and right sides.  He has 5 out of 5 strength in all muscle groups of the bilateral  upper extremities. Specialty Comments:  No specialty comments available.  Imaging: XR Cervical Spine 2 or 3 views  Result Date: 09/11/2019 2 views of the cervical spine show no acute findings  XR Lumbar Spine 2-3 Views  Result Date: 09/11/2019 2 views of the lumbar spine show severe degenerative changes at multiple levels especially at L5-S1.  This is worsened from previous films.    PMFS History: Patient Active Problem List   Diagnosis Date Noted  . Personal history of colonic adenomas 03/26/2013  . OBSTRUCTIVE SLEEP APNEA 10/30/2008  . HYPERLIPIDEMIA-MIXED 07/01/2008  . HYPERTENSION, BENIGN 07/01/2008  . CAD, NATIVE VESSEL 07/01/2008  . FATIGUE / MALAISE 07/01/2008  . EDEMA 07/01/2008   Past Medical History:  Diagnosis Date  . Acute renal failure ([place] [place])     secondary to nephrotic syndrome post cath, resolved with steroids  . CAD (coronary artery disease) January 2008   LIMA to LAD, sequential SVG to first, second, and third OM's, sequential SVG to mid RCA and PDA   . Diabetes mellitus without complication ([place])   . HTN (hypertension)   . Hx of adenomatous colonic polyps   . Hyperlipidemia     Family History  Problem Relation Age of Onset  . Heart attack Mother   . Heart disease Sister   . Clotting disorder Father        died of renal disease  . Colon cancer Neg Hx     Past Surgical History:  Procedure Laterality Date  . COLONOSCOPY W/ BIOPSIES AND POLYPECTOMY    . CORONARY ARTERY BYPASS GRAFT  2008   VESSELS X6  . UMBILICAL HERNIA REPAIR     Social History   Occupational History  . Occupation: Surveyor, mining  Tobacco Use  . Smoking status: Never Smoker  . Smokeless tobacco: Never Used  Substance and Sexual Activity  . Alcohol use: No    Alcohol/week: 1.0 standard drinks    Types: 1 Glasses of wine per week  . Drug use: No  . Sexual activity: Not on file

## 2019-09-12 ENCOUNTER — Other Ambulatory Visit: Payer: Self-pay

## 2019-09-12 DIAGNOSIS — M545 Low back pain, unspecified: Secondary | ICD-10-CM

## 2019-09-12 DIAGNOSIS — G8929 Other chronic pain: Secondary | ICD-10-CM

## 2019-09-17 ENCOUNTER — Telehealth: Payer: Self-pay | Admitting: Orthopaedic Surgery

## 2019-09-17 DIAGNOSIS — E782 Mixed hyperlipidemia: Secondary | ICD-10-CM | POA: Diagnosis not present

## 2019-09-17 DIAGNOSIS — E1129 Type 2 diabetes mellitus with other diabetic kidney complication: Secondary | ICD-10-CM | POA: Diagnosis not present

## 2019-09-17 DIAGNOSIS — Z0000 Encounter for general adult medical examination without abnormal findings: Secondary | ICD-10-CM | POA: Diagnosis not present

## 2019-09-17 NOTE — Telephone Encounter (Signed)
Pt called stating he would like to see if he can get an MRI at [facility] because he's scheduled for 10/15/19 and that's a bit of a long wait for him.   [phone]

## 2019-09-18 DIAGNOSIS — C44219 Basal cell carcinoma of skin of left ear and external auricular canal: Secondary | ICD-10-CM | POA: Diagnosis not present

## 2019-09-18 NOTE — Telephone Encounter (Signed)
Emailed April Pait with [facility] central scheduling, they will contact pt toschedule appt. [employer] site is down could not get auth. Will try again later

## 2019-09-23 NOTE — Telephone Encounter (Signed)
[employer] authorization aproved.

## 2019-09-24 DIAGNOSIS — I129 Hypertensive chronic kidney disease with stage 1 through stage 4 chronic kidney disease, or unspecified chronic kidney disease: Secondary | ICD-10-CM | POA: Diagnosis not present

## 2019-09-24 DIAGNOSIS — M5416 Radiculopathy, lumbar region: Secondary | ICD-10-CM | POA: Diagnosis not present

## 2019-09-24 DIAGNOSIS — N1831 Chronic kidney disease, stage 3a: Secondary | ICD-10-CM | POA: Diagnosis not present

## 2019-09-24 DIAGNOSIS — E782 Mixed hyperlipidemia: Secondary | ICD-10-CM | POA: Diagnosis not present

## 2019-09-24 DIAGNOSIS — R82998 Other abnormal findings in urine: Secondary | ICD-10-CM | POA: Diagnosis not present

## 2019-09-24 DIAGNOSIS — Z0000 Encounter for general adult medical examination without abnormal findings: Secondary | ICD-10-CM | POA: Diagnosis not present

## 2019-09-24 DIAGNOSIS — I25810 Atherosclerosis of coronary artery bypass graft(s) without angina pectoris: Secondary | ICD-10-CM | POA: Diagnosis not present

## 2019-09-24 DIAGNOSIS — R3915 Urgency of urination: Secondary | ICD-10-CM | POA: Diagnosis not present

## 2019-09-24 DIAGNOSIS — E1129 Type 2 diabetes mellitus with other diabetic kidney complication: Secondary | ICD-10-CM | POA: Diagnosis not present

## 2019-09-24 DIAGNOSIS — M542 Cervicalgia: Secondary | ICD-10-CM | POA: Diagnosis not present

## 2019-09-24 DIAGNOSIS — I10 Essential (primary) hypertension: Secondary | ICD-10-CM | POA: Diagnosis not present

## 2019-09-25 ENCOUNTER — Ambulatory Visit: Payer: Medicare HMO | Admitting: Orthopaedic Surgery

## 2019-09-29 ENCOUNTER — Ambulatory Visit (HOSPITAL_COMMUNITY): Admission: RE | Admit: 2019-09-29 | Payer: Medicare HMO | Source: Ambulatory Visit

## 2019-10-09 ENCOUNTER — Ambulatory Visit: Payer: Medicare HMO | Admitting: Orthopaedic Surgery

## 2019-10-12 ENCOUNTER — Ambulatory Visit (INDEPENDENT_AMBULATORY_CARE_PROVIDER_SITE_OTHER): Payer: Medicare HMO

## 2019-10-12 ENCOUNTER — Other Ambulatory Visit: Payer: Self-pay

## 2019-10-12 DIAGNOSIS — M5442 Lumbago with sciatica, left side: Secondary | ICD-10-CM

## 2019-10-12 DIAGNOSIS — M5116 Intervertebral disc disorders with radiculopathy, lumbar region: Secondary | ICD-10-CM | POA: Diagnosis not present

## 2019-10-12 DIAGNOSIS — M545 Low back pain, unspecified: Secondary | ICD-10-CM

## 2019-10-12 DIAGNOSIS — G8929 Other chronic pain: Secondary | ICD-10-CM

## 2019-10-12 DIAGNOSIS — M48061 Spinal stenosis, lumbar region without neurogenic claudication: Secondary | ICD-10-CM | POA: Diagnosis not present

## 2019-10-15 ENCOUNTER — Other Ambulatory Visit: Payer: Medicare HMO

## 2019-10-23 ENCOUNTER — Encounter: Payer: Self-pay | Admitting: Orthopaedic Surgery

## 2019-10-23 ENCOUNTER — Other Ambulatory Visit: Payer: Self-pay

## 2019-10-23 ENCOUNTER — Ambulatory Visit: Payer: Medicare HMO | Admitting: Orthopaedic Surgery

## 2019-10-23 DIAGNOSIS — M48061 Spinal stenosis, lumbar region without neurogenic claudication: Secondary | ICD-10-CM | POA: Diagnosis not present

## 2019-10-23 NOTE — Progress Notes (Signed)
HPI: [name] comes in today to go over his MRI of his lumbar spine.  He continues to have pain mostly in the left buttocks region and hip.  Whenever he first stands.  Denies any real radicular symptoms down either leg.  Had prior lumbar epidural steroid injection with Dr. Ernestina Patches health records unavailable.  He is also been to therapy in the past and told that he has tight hamstrings. MRI lumbar spine dated 10/13/2019 showed no significant change with MRI of the lumbar spine dated 12/28/2017.  Most significant findings are at L4-5 with an anterior listhesis of 4 mm which resulted in moderate spinal stenosis.  Also moderate to severe subarticular stenosis bilaterally.  L5-S1 moderate foraminal stenosis and subarticular stenosis on the right.  Mild on the left.  L3-4 mild to moderate spinal stenosis and bilateral moderate subarticular stenosis.  L2-3 bilateral facet hypertrophy.  L2-3 bilateral moderate subarticular stenosis mild spinal stenosis.   Impression: Spinal stenosis lumbar spine  Plan: Recommend repeat lumbar epidural steroid injection.  Did discuss with him sending back to formal physical therapy for stretching and core strengthening he like to hold off on this for now.  He also would like to hold on the epidural steroid injection and will call us if he wishes to proceed with this.  Questions encouraged and answered at length by Dr. Ninfa Linden myself.

## 2019-10-31 DIAGNOSIS — H35371 Puckering of macula, right eye: Secondary | ICD-10-CM | POA: Diagnosis not present

## 2019-10-31 DIAGNOSIS — H11153 Pinguecula, bilateral: Secondary | ICD-10-CM | POA: Diagnosis not present

## 2019-10-31 DIAGNOSIS — H353131 Nonexudative age-related macular degeneration, bilateral, early dry stage: Secondary | ICD-10-CM | POA: Diagnosis not present

## 2019-10-31 DIAGNOSIS — H26491 Other secondary cataract, right eye: Secondary | ICD-10-CM | POA: Diagnosis not present

## 2019-10-31 DIAGNOSIS — Z961 Presence of intraocular lens: Secondary | ICD-10-CM | POA: Diagnosis not present

## 2019-11-06 ENCOUNTER — Encounter: Payer: Self-pay | Admitting: Cardiovascular Disease

## 2019-11-06 ENCOUNTER — Ambulatory Visit: Payer: Medicare HMO | Admitting: Cardiovascular Disease

## 2019-11-06 ENCOUNTER — Other Ambulatory Visit: Payer: Self-pay

## 2019-11-06 VITALS — BP 118/86 | HR 67 | Ht 68.0 in | Wt 186.0 lb

## 2019-11-06 DIAGNOSIS — I25119 Atherosclerotic heart disease of native coronary artery with unspecified angina pectoris: Secondary | ICD-10-CM

## 2019-11-06 DIAGNOSIS — E782 Mixed hyperlipidemia: Secondary | ICD-10-CM | POA: Diagnosis not present

## 2019-11-06 DIAGNOSIS — R609 Edema, unspecified: Secondary | ICD-10-CM | POA: Diagnosis not present

## 2019-11-06 DIAGNOSIS — I10 Essential (primary) hypertension: Secondary | ICD-10-CM | POA: Diagnosis not present

## 2019-11-06 NOTE — Progress Notes (Signed)
Cardiology Office Note:    Date:  11/06/2019   ID:  [name], DOB 11-21-1934, MRN [id number]  PCP:  Marton Redwood, MD  [facility] Cardiologist:  No primary care provider on file.  [facility] Electrophysiologist:  None   Referring MD: Marton Redwood, MD   Chief Complaint  Patient presents with  . Coronary Artery Disease    History of Present Illness:    [name] is a 85 y.o. male with a hx of coronary artery disease, presenting for follow-up evaluation.  The patient underwent multivessel CABG in 2008 when he presented with symptoms of unstable angina.  Graft anatomy includes a LIMA to LAD, sequential saphenous vein graft to first second and third obtuse marginals, and sequential saphenous vein graft to the RCA and right PDA.  When he was last seen in May 2019 he complained of symptoms that reminded him of his prior angina.  A stress Myoview was done and this demonstrated an LVEF of 50% with an irreversible defect in the inferior wall consistent with prior infarct but no ischemia.  Ongoing medical therapy was recommended.  The patient is here alone today.  He is doing fairly well.  States that his balance is not as good as it used to be.  Otherwise no specific symptoms of chest pain, chest pressure, or shortness of breath.  No lightheadedness, heart palpitations, or syncope.  He has had about 6 to 8 weeks of ankle edema.  Denies orthopnea or PND.  Denies high sodium diet.  States that he occasionally adds a little salt to a tomato, but otherwise does not put any salt on his food.  Past Medical History:  Diagnosis Date  . Acute renal failure ([place])    secondary to nephrotic syndrome post cath, resolved with steroids  . CAD (coronary artery disease) January 2008   LIMA to LAD, sequential SVG to first, second, and third OM's, sequential SVG to mid RCA and PDA   . Diabetes mellitus without complication ([place])   . HTN (hypertension)   . Hx of adenomatous colonic polyps    . Hyperlipidemia     Past Surgical History:  Procedure Laterality Date  . COLONOSCOPY W/ BIOPSIES AND POLYPECTOMY    . CORONARY ARTERY BYPASS GRAFT  2008   VESSELS X6  . UMBILICAL HERNIA REPAIR      Current Medications: Current Meds  Medication Sig  . aspirin EC 81 MG tablet Take 81 mg by mouth daily.  Marland Kitchen atorvastatin (LIPITOR) 40 MG tablet Take 40 mg by mouth daily.  . carvedilol (COREG) 12.5 MG tablet Take 12.5 mg by mouth 2 (two) times daily with a meal.  . CIALIS 5 MG tablet Take 1 tablet by mouth every evening.  . furosemide (LASIX) 20 MG tablet Take one tablet by mouth  . JARDIANCE 25 MG TABS tablet Take 1 tablet by mouth every evening.  Marland Kitchen losartan (COZAAR) 100 MG tablet Take 100 mg by mouth daily.  . RYBELSUS 7 MG TABS Take 1 tablet by mouth daily.  . tamsulosin (FLOMAX) 0.4 MG CAPS capsule Take 1 capsule by mouth daily.  . vitamin E 100 UNIT capsule Take 100 Units by mouth daily.  Marland Kitchen ZETIA 10 MG tablet Take 1 tablet by mouth every morning.     Allergies:   Patient has no known allergies.   Social History   Socioeconomic History  . Marital status: Widowed    Spouse name: Not on file  . Number of children: Not  on file  . Years of education: Not on file  . Highest education level: Not on file  Occupational History  . Occupation: Surveyor, mining  Tobacco Use  . Smoking status: Never Smoker  . Smokeless tobacco: Never Used  Substance and Sexual Activity  . Alcohol use: No    Alcohol/week: 1.0 standard drink    Types: 1 Glasses of wine per week  . Drug use: No  . Sexual activity: Not on file  Other Topics Concern  . Not on file  Social History Narrative  . Not on file   Social Determinants of Health   Financial Resource Strain:   . Difficulty of Paying Living Expenses:   Food Insecurity:   . Worried About Charity fundraiser in the Last Year:   . Arboriculturist in the Last Year:   Transportation Needs:   . Film/video editor (Medical):   Marland Kitchen  Lack of Transportation (Non-Medical):   Physical Activity:   . Days of Exercise per Week:   . Minutes of Exercise per Session:   Stress:   . Feeling of Stress :   Social Connections:   . Frequency of Communication with Friends and Family:   . Frequency of Social Gatherings with Friends and Family:   . Attends Religious Services:   . Active Member of Clubs or Organizations:   . Attends Archivist Meetings:   Marland Kitchen Marital Status:      Family History: The patient's family history includes Clotting disorder in his father; Heart attack in his mother; Heart disease in his sister. There is no history of Colon cancer.  ROS:   Please see the history of present illness.    All other systems reviewed and are negative.  EKGs/Labs/Other Studies Reviewed:    The following studies were reviewed today: Myoview 09-04-2017: Study Highlights    Nuclear stress EF: 50%.  Blood pressure demonstrated a hypertensive response to exercise.  There was no ST segment deviation noted during stress.  Defect 1: There is a medium defect of severe severity present in the basal inferior, basal inferolateral, mid inferior and apical inferior location.  Findings consistent with prior myocardial infarction.  This is an intermediate risk study.  The left ventricular ejection fraction is mildly decreased (45-54%).   There is a medium size, irreversible defect in the basal inferolateral, basal, mid and apical inferior walls consistent with prior infarction but no ischemia. LVEF 50% with hypokinesis in the inferior walls.    EKG:  EKG is ordered today.  The ekg ordered today demonstrates NSR 67 bpm, PAC's, age-indeterminate inferior MI, no changes compared with 08/28/2017 tracing  Recent Labs: No results found for requested labs within last 8760 hours.  Recent Lipid Panel    Component Value Date/Time   CHOL 128 09/20/2007 0826   TRIG 67 09/20/2007 0826   HDL 33.0 (L) 09/20/2007 0826   CHOLHDL  3.9 CALC 09/20/2007 0826   VLDL 13 09/20/2007 0826   LDLCALC 82 09/20/2007 0826    Physical Exam:    VS:  BP 118/86   Pulse 67   Ht 5\' 8"  (1.727 m)   Wt 186 lb (84.4 kg)   SpO2 98%   BMI 28.28 kg/m     Wt Readings from Last 3 Encounters:  11/06/19 186 lb (84.4 kg)  09/04/17 185 lb (83.9 kg)  08/28/17 185 lb 12.8 oz (84.3 kg)     GEN:  Well nourished, well developed elderly male in no  acute distress HEENT: Normal NECK: No JVD; No carotid bruits LYMPHATICS: No lymphadenopathy CARDIAC: RRR, no murmurs, rubs, gallops RESPIRATORY:  Clear to auscultation without rales, wheezing or rhonchi  ABDOMEN: Soft, non-tender, non-distended MUSCULOSKELETAL:  No edema; No deformity  SKIN: Warm and dry NEUROLOGIC:  Alert and oriented x 3 PSYCHIATRIC:  Normal affect   ASSESSMENT:    1. Coronary artery disease involving native coronary artery of native heart with angina pectoris ([place])   2. Mixed hyperlipidemia   3. Essential hypertension   4. Edema, unspecified type    PLAN:    In order of problems listed above:  1. The patient is stable without symptoms of angina.  I reviewed his Myoview scan from 2019 demonstrating no significant ischemia.  He remains on aspirin, carvedilol, losartan in the setting of diabetes, and a high intensity statin drug. 2. Treated with atorvastatin 40 mg and ezetimibe 10 mg with recent LDL cholesterol 64 mg/dL.  Continue current therapy. 3. Blood pressure well controlled on losartan and carvedilol 4. Patient with 2+ edema localized in the ankles.  Uncertain etiology, venous insufficiency versus CHF.  No calf tenderness or redness.  Recommend check 2D echo.  We will give him Lounge Doctor information for leg elevation. Discussed sodium restriction.   Medication Adjustments/Labs and Tests Ordered: Current medicines are reviewed at length with the patient today.  Concerns regarding medicines are outlined above.  No orders of the defined types were placed in this  encounter.  No orders of the defined types were placed in this encounter.   There are no Patient Instructions on file for this visit.   Signed, Sherren Mocha, MD  11/06/2019 2:14 PM    [facility]

## 2019-11-06 NOTE — Patient Instructions (Addendum)
Medication Instructions:  Your provider recommends that you continue on your current medications as directed. Please refer to the Current Medication list given to you today.   *If you need a refill on your cardiac medications before your next appointment, please call your pharmacy*  Testing/Procedures: Your provider has requested that you have an echocardiogram. Echocardiography is a painless test that uses sound waves to create images of your heart. It provides your doctor with information about the size and shape of your heart and how well your heart's chambers and valves are working. This procedure takes approximately one hour. There are no restrictions for this procedure.  Follow-Up: At [facility], you and your health needs are our priority.  As part of our continuing mission to provide you with exceptional heart care, we have created designated Provider Care Teams.  These Care Teams include your primary Cardiologist (physician) and Advanced Practice Providers (APPs -  Physician Assistants and Nurse Practitioners) who all work together to provide you with the care you need, when you need it. Your next appointment:   12 month(s) The format for your next appointment:   In Person Provider:   You may see Sherren Mocha, MD or one of the following Advanced Practice Providers on your designated Care Team:    Richardson Dopp, PA-C  Robbie Lis, [place]      For your leg edema you should do the following: 1. Leg elevation - I recommend the Lounge Dr. Leg rest.  See below for details  2. Salt restriction  -  Use potassium chloride instead of regular salt as a salt substitute. 3. Walk regularly 4. Compression hose - [facility] 5. Weight loss    Available on [email or web address] Or  Go to [email or web address]

## 2019-11-07 DIAGNOSIS — Z961 Presence of intraocular lens: Secondary | ICD-10-CM | POA: Diagnosis not present

## 2019-11-07 DIAGNOSIS — H5201 Hypermetropia, right eye: Secondary | ICD-10-CM | POA: Diagnosis not present

## 2019-11-07 DIAGNOSIS — Z9841 Cataract extraction status, right eye: Secondary | ICD-10-CM | POA: Diagnosis not present

## 2019-11-07 DIAGNOSIS — H52221 Regular astigmatism, right eye: Secondary | ICD-10-CM | POA: Diagnosis not present

## 2019-11-19 ENCOUNTER — Ambulatory Visit (HOSPITAL_COMMUNITY): Payer: Medicare HMO | Attending: Cardiology

## 2019-11-19 ENCOUNTER — Other Ambulatory Visit: Payer: Self-pay

## 2019-11-19 DIAGNOSIS — R609 Edema, unspecified: Secondary | ICD-10-CM | POA: Insufficient documentation

## 2019-11-19 DIAGNOSIS — I348 Other nonrheumatic mitral valve disorders: Secondary | ICD-10-CM

## 2019-11-19 LAB — ECHOCARDIOGRAM COMPLETE
Area-P 1/2: 3.65 cm2
S' Lateral: 2.8 cm

## 2019-11-19 MED ORDER — PERFLUTREN LIPID MICROSPHERE
1.0000 mL | INTRAVENOUS | Status: AC | PRN
  Administered 2019-11-19: 1 mL via INTRAVENOUS

## 2019-11-22 ENCOUNTER — Telehealth: Payer: Self-pay | Admitting: Cardiovascular Disease

## 2019-11-22 NOTE — Telephone Encounter (Signed)
Left message to call office

## 2019-11-22 NOTE — Telephone Encounter (Signed)
I spoke with patient and reviewed echo results with him Patient reports he was recently at a restaurant. He started not making any sense when talking.  Went outside and sat down. His family drove him home. EMS met them at home.  At this point patient was feeling better.  No further episodes.  He did not pass out.  Patient reports this happens every year since 2003 in November 11, 2022 or 12/12/2022.  Has passed out previously with these events. Reports his 2 sons have had similar issues and have been evaluated but no cause has been found. Patient is feeling fine at the current time.

## 2019-11-22 NOTE — Telephone Encounter (Signed)
Patient calling back.   °

## 2019-11-22 NOTE — Telephone Encounter (Signed)
Patient is returning call to discuss results from echocardiogram completed on 11/19/19.

## 2019-11-22 NOTE — Telephone Encounter (Signed)
Patient is returning call.  °

## 2019-11-22 NOTE — Telephone Encounter (Signed)
I spoke with patient and asked him to let us know if he had a recurrence

## 2019-11-22 NOTE — Telephone Encounter (Signed)
Ok thanks. Let us know if he has a recurrence. Would be reasonable to order an outpatient monitor, but if these have happened annually we would be unlikely to pick anything up and arrhythmic cause would be less likely over such a long period of time. thanks

## 2019-12-03 DIAGNOSIS — I10 Essential (primary) hypertension: Secondary | ICD-10-CM | POA: Diagnosis not present

## 2019-12-03 DIAGNOSIS — R609 Edema, unspecified: Secondary | ICD-10-CM | POA: Diagnosis not present

## 2019-12-03 DIAGNOSIS — R55 Syncope and collapse: Secondary | ICD-10-CM | POA: Diagnosis not present

## 2019-12-03 DIAGNOSIS — N1831 Chronic kidney disease, stage 3a: Secondary | ICD-10-CM | POA: Diagnosis not present

## 2019-12-03 DIAGNOSIS — F3341 Major depressive disorder, recurrent, in partial remission: Secondary | ICD-10-CM | POA: Diagnosis not present

## 2019-12-03 DIAGNOSIS — E1129 Type 2 diabetes mellitus with other diabetic kidney complication: Secondary | ICD-10-CM | POA: Diagnosis not present

## 2019-12-16 ENCOUNTER — Other Ambulatory Visit: Payer: Self-pay | Admitting: Radiology

## 2019-12-16 ENCOUNTER — Telehealth: Payer: Self-pay | Admitting: Physical Medicine and Rehabilitation

## 2019-12-16 DIAGNOSIS — M48061 Spinal stenosis, lumbar region without neurogenic claudication: Secondary | ICD-10-CM

## 2019-12-16 NOTE — Telephone Encounter (Signed)
Patient saw Artis Delay on 7/7 and was to call him back if he wanted to proceed with referral for injection. Please advise.

## 2019-12-16 NOTE — Telephone Encounter (Signed)
Please make the referral

## 2019-12-16 NOTE — Telephone Encounter (Signed)
Referral placed.

## 2019-12-16 NOTE — Telephone Encounter (Signed)
Patient called needing to schedule an appointment with Dr. Ernestina Patches for back pain. The number to contact patient is [phone]

## 2019-12-16 NOTE — Telephone Encounter (Signed)
Ok to place order? Or schedule patient for new OV for eval

## 2019-12-17 ENCOUNTER — Telehealth: Payer: Self-pay | Admitting: Physical Medicine and Rehabilitation

## 2019-12-17 NOTE — Telephone Encounter (Signed)
Patient called needing to schedule an appointment for his back. The number to contact patient is [phone]

## 2019-12-17 NOTE — Telephone Encounter (Signed)
Left message to advise patient that the referral was placed only yesterday and this is pending insurance authorization.

## 2019-12-18 ENCOUNTER — Telehealth: Payer: Self-pay | Admitting: Physical Medicine and Rehabilitation

## 2019-12-18 NOTE — Telephone Encounter (Signed)
Patient called and I read note from Loma Sousa to him advising procedure is pending insurance authorization. Also advised patient he will get a call when Dr. Romona Curls office receive [name].   The number to contact patient is [phone]

## 2019-12-18 NOTE — Telephone Encounter (Signed)
Called pt twice and no answer 

## 2019-12-30 ENCOUNTER — Telehealth: Payer: Self-pay | Admitting: Physical Medicine and Rehabilitation

## 2019-12-30 NOTE — Telephone Encounter (Signed)
Pt would like to get scheduled for an appt please   [phone]

## 2019-12-30 NOTE — Telephone Encounter (Signed)
Scheduled

## 2020-01-13 ENCOUNTER — Ambulatory Visit: Payer: Medicare HMO | Admitting: Physical Medicine and Rehabilitation

## 2020-01-13 ENCOUNTER — Encounter: Payer: Self-pay | Admitting: Physical Medicine and Rehabilitation

## 2020-01-13 ENCOUNTER — Other Ambulatory Visit: Payer: Self-pay

## 2020-01-13 ENCOUNTER — Ambulatory Visit: Payer: Self-pay

## 2020-01-13 VITALS — BP 142/82 | HR 60

## 2020-01-13 DIAGNOSIS — M25551 Pain in right hip: Secondary | ICD-10-CM

## 2020-01-13 DIAGNOSIS — M5416 Radiculopathy, lumbar region: Secondary | ICD-10-CM | POA: Diagnosis not present

## 2020-01-13 DIAGNOSIS — M47816 Spondylosis without myelopathy or radiculopathy, lumbar region: Secondary | ICD-10-CM | POA: Diagnosis not present

## 2020-01-13 DIAGNOSIS — M48061 Spinal stenosis, lumbar region without neurogenic claudication: Secondary | ICD-10-CM | POA: Diagnosis not present

## 2020-01-13 MED ORDER — BUPIVACAINE HCL 0.25 % IJ SOLN
4.0000 mL | INTRAMUSCULAR | Status: AC | PRN
  Administered 2020-01-13: 12:00:00 4 mL via INTRA_ARTICULAR

## 2020-01-13 MED ORDER — BETAMETHASONE SOD PHOS & ACET 6 (3-3) MG/ML IJ SUSP: 12.0000 mg | Freq: Once | INTRAMUSCULAR | Status: DC

## 2020-01-13 MED ORDER — TRIAMCINOLONE ACETONIDE 40 MG/ML IJ SUSP
60.0000 mg | INTRAMUSCULAR | Status: AC | PRN
  Administered 2020-01-13: 12:00:00 60 mg via INTRA_ARTICULAR

## 2020-01-13 NOTE — Progress Notes (Signed)
Pt state he has pain in his right groin area. Pt state he only feels the pain when he has to get up out of bed and chair. Pt state he keeps it moving to ease the pain.  Numeric Pain Rating Scale and Functional Assessment Average Pain 6   In the last MONTH (on 0-10 scale) has pain interfered with the following?  1. General activity like being  able to carry out your everyday physical activities such as walking, climbing stairs, carrying groceries, or moving a chair?  Rating(7)   +Driver, -BT, -Dye Allergies.

## 2020-01-13 NOTE — Progress Notes (Signed)
[name] - 85 y.o. male MRN [id number]  Date of birth: 17-Nov-1934  Office Visit Note: Visit Date: 01/13/2020 PCP: Marton Redwood, MD Referred by: Marton Redwood, MD  Subjective: Chief Complaint  Patient presents with  . Right Hip - Pain  . Lower Back - Pain  . Neck - Pain   HPI: [name] is a 85 y.o. male who comes in today What was planned to be lumbar epidural injection for left buttock pain which had really failed to get better with conservative care including activity modification and medication as well as therapy etc.  Reading the notes from Dr. Ninfa Linden and Benita Stabile, PA-C the patient was having left buttock pain worse with standing and ambulating.  MRI of the lumbar spine was performed and this is reviewed below and reviewed with the patient today.  They also talked about the fact that he had had epidural injections by me in the past but those notes were not available.  I did review the Epic system as well as our old system which was [facility].  I could not find anywhere where I had completed epidural injection.  We had completed a right sided intra-articular injection in October 2019 which was very successful and relieved a lot of his right hip and groin pain.  I did talk to him at length about his spine and his back.  He does have back pain worse with standing but his biggest complaint today is in point of fact his right hip and groin.  Is really worse when he gets up out of the chair or out of bed.  Is worse with moving.  Is worse getting in and out of the car.  Once he is up walking he says is much better.  He has nothing down the legs.  He has nothing in the left buttock area.  He does not remember if he had it in the left buttock area when he saw Dr. Ninfa Linden.  He does endorse having epidural injections by Dr. Laroy Apple whom he felt like was just "throwing darts at his spine ".  He said 1 injection helped at all the other ones did not.  He has not had lumbar spine  surgery.  He reports in 2019 that Dr. Ninfa Linden did not want to do hip surgery.  Hip x-rays really been unrevealing showing not much in the way of arthritic change with good joint spacing.  He has not had MRI of the hip.  Lastly he does asked me today about upper cervical neck pain worse with extension.  No pain in the arms.  It appears that cervical spine x-rays were performed by Benita Stabile at some point.  Review of Systems  Musculoskeletal: Positive for back pain, joint pain and neck pain.  All other systems reviewed and are negative.  Otherwise per HPI.  Assessment & Plan: Visit Diagnoses:  1. Pain in right hip   2. Lumbar radiculopathy   3. Bilateral stenosis of lateral recess of lumbar spine   4. Spondylosis without myelopathy or radiculopathy, lumbar region     Plan: Findings:  1.  Chronic worsening severe right hip and groin pain.  Rates his pain a 6 out of 10 worse with moving worse or getting up from a seated position.  Prior hip injection with fluoroscopic guidance performed in 2019 was successful.  I am going to repeat that today.  His biggest complaint is his right hip and groin.  He denies any left  buttock pain.  He does have some low back pain.  Injection performed today did yield 3 mL of normal-appearing joint fluid but likely indicative of an inflamed joint.  MRI of the hip joint may be needed in the future if he continues to get flareups.  Last injection however was in 2019.  2.  Chronic long-term history of low back pain with epidural injections by Dr. Laroy Apple.  Would consider epidural injections if he is just getting more back and hip pain that was posterior.  His injection today in the hip gave him a lot of relief though as he was leaving the office during the anesthetic phase.  Will follow up as needed with Dr. Ninfa Linden.  Would be happy to look at epidural injection versus facet blocks etc.  3.  Cervicalgia with extension consistent with facet mediated cervical pain.   Brief look at the x-ray shows quite a bit of arthritic changes C4-5 and C5-6 with small listhesis and straightening of the normal lordotic curvature.  I would be happy to see him for evaluation of this and will get an appointment made for that.    Meds & Orders:  Meds ordered this encounter  Medications  . DISCONTD: betamethasone acetate-betamethasone sodium phosphate (CELESTONE) injection 12 mg    Orders Placed This Encounter  Procedures  . Large Joint Inj: R hip joint  . XR C-ARM NO REPORT    Follow-up: Return for Return for office visit to further evaluate cervical.   Procedures: Large Joint Inj: R hip joint on 01/13/2020 12:00 PM Indications: diagnostic evaluation and pain Details: 22 G 3.5 in needle, fluoroscopy-guided anterior approach  Arthrogram: No  Medications: 4 mL bupivacaine 0.25 %; 60 mg triamcinolone acetonide 40 MG/ML Aspirate: 3 mL serous Outcome: tolerated well, no immediate complications  There was excellent flow of contrast producing a partial arthrogram of the hip. The patient did have relief of symptoms during the anesthetic phase of the injection. Procedure, treatment alternatives, risks and benefits explained, specific risks discussed. Consent was given by the patient. Immediately prior to procedure a time out was called to verify the correct patient, procedure, equipment, support staff and site/side marked as required. Patient was prepped and draped in the usual sterile fashion.      No notes on file   Clinical History: MRI LUMBAR SPINE WITHOUT CONTRAST  TECHNIQUE: Multiplanar, multisequence MR imaging of the lumbar spine was performed. No intravenous contrast was administered.  COMPARISON:  Lumbar MRI 12/28/2017  FINDINGS: Segmentation:  Normal  Alignment: 4 mm anterolisthesis L4-5 unchanged. Remaining alignment normal.  Vertebrae:  Negative for fracture or mass.  No bone marrow edema.  Conus medullaris and cauda equina: Conus extends  to the L1 level. Conus and cauda equina appear normal.  Paraspinal and other soft tissues: Negative for paraspinous mass or adenopathy. No it soft tissue edema or fluid collection.  Disc levels:  T12-L1: Negative  L1-2: Mild disc and mild facet degeneration. Negative for disc protrusion or stenosis.  L2-3: Diffuse disc bulging and endplate spurring. Bilateral facet hypertrophy. Mild spinal stenosis and moderate subarticular stenosis bilaterally.  L3-4: Disc degeneration with diffuse disc bulging and endplate spurring. Bilateral facet hypertrophy. Mild spinal stenosis and moderate subarticular stenosis bilaterally due to spurring. . Interval resolution of small synovial cyst on the left.  L4-5: 4 mm anterolisthesis with advanced facet degeneration. Disc degeneration with diffuse disc bulging. Moderate spinal stenosis. Moderate to severe subarticular stenosis bilaterally. No change since the prior MRI.  L5-S1: Disc  degeneration with diffuse endplate spurring. Mild facet degeneration. Moderate subarticular and foraminal stenosis on the right. Mild subarticular stenosis on the left.  IMPRESSION: Multilevel degenerative change throughout the lumbar spine similar to the prior MRI. There is resolution of the left-sided small synovial cyst at L3-4. No new or acute findings.   Electronically Signed   By: Franchot Gallo M.D.   On: 10/13/2019 09:37   He reports that he has never smoked. He has never used smokeless tobacco. No results for input(s): HGBA1C, LABURIC in the last 8760 hours.  Objective:  VS:  HT:    WT:   BMI:     BP:(!) 142/82  HR:60bpm  TEMP: ( )  RESP:  Physical Exam Constitutional:      General: He is not in acute distress.    Appearance: Normal appearance. He is not ill-appearing.  HENT:     Head: Normocephalic and atraumatic.     Right Ear: External ear normal.     Left Ear: External ear normal.  Eyes:     Extraocular Movements: Extraocular  movements intact.  Cardiovascular:     Rate and Rhythm: Normal rate.     Pulses: Normal pulses.  Abdominal:     General: There is no distension.     Palpations: Abdomen is soft.  Musculoskeletal:        General: No tenderness or signs of injury.     Right lower leg: No edema.     Left lower leg: No edema.     Comments: Patient has good distal strength without clonus.  He ambulates with a forward flexed lumbar spine.  He has pain with extension of the lumbar spine.  He has concordant pain of the right hip with internal rotation flexion rotation.  No pain in the left.  He has no pain over the greater trochanters.  Skin:    Findings: No erythema or rash.  Neurological:     General: No focal deficit present.     Mental Status: He is alert and oriented to person, place, and time.     Sensory: No sensory deficit.     Motor: No weakness or abnormal muscle tone.     Coordination: Coordination normal.  Psychiatric:        Mood and Affect: Mood normal.        Behavior: Behavior normal.     Ortho Exam  Imaging: XR C-ARM NO REPORT  Result Date: 01/13/2020 Please see Notes tab for imaging impression.   Past Medical/Family/Surgical/Social History: Medications & Allergies reviewed per EMR, new medications updated. Patient Active Problem List   Diagnosis Date Noted  . Personal history of colonic adenomas 03/26/2013  . OBSTRUCTIVE SLEEP APNEA 10/30/2008  . HYPERLIPIDEMIA-MIXED 07/01/2008  . HYPERTENSION, BENIGN 07/01/2008  . CAD, NATIVE VESSEL 07/01/2008  . FATIGUE / MALAISE 07/01/2008  . EDEMA 07/01/2008   Past Medical History:  Diagnosis Date  . Acute renal failure ([place])    secondary to nephrotic syndrome post cath, resolved with steroids  . CAD (coronary artery disease) January 2008   LIMA to LAD, sequential SVG to first, second, and third OM's, sequential SVG to mid RCA and PDA   . Diabetes mellitus without complication ([place])   . HTN (hypertension)   . Hx of adenomatous  colonic polyps   . Hyperlipidemia    Family History  Problem Relation Age of Onset  . Heart attack Mother   . Heart disease Sister   . Clotting disorder Father  died of renal disease  . Colon cancer Neg Hx    Past Surgical History:  Procedure Laterality Date  . COLONOSCOPY W/ BIOPSIES AND POLYPECTOMY    . CORONARY ARTERY BYPASS GRAFT  2008   VESSELS X6  . UMBILICAL HERNIA REPAIR     Social History   Occupational History  . Occupation: Surveyor, mining  Tobacco Use  . Smoking status: Never Smoker  . Smokeless tobacco: Never Used  Substance and Sexual Activity  . Alcohol use: No    Alcohol/week: 1.0 standard drink    Types: 1 Glasses of wine per week  . Drug use: No  . Sexual activity: Not on file

## 2020-01-20 ENCOUNTER — Telehealth: Payer: Self-pay | Admitting: Physical Medicine and Rehabilitation

## 2020-01-20 NOTE — Telephone Encounter (Signed)
Called pt and resch appt. 10/12.

## 2020-01-20 NOTE — Telephone Encounter (Signed)
Patient called needing to reschedule his appointment. The number to contact patient is [phone]

## 2020-01-21 ENCOUNTER — Ambulatory Visit: Payer: Medicare HMO | Admitting: Physical Medicine and Rehabilitation

## 2020-01-21 DIAGNOSIS — R3915 Urgency of urination: Secondary | ICD-10-CM | POA: Diagnosis not present

## 2020-01-21 DIAGNOSIS — N138 Other obstructive and reflux uropathy: Secondary | ICD-10-CM | POA: Insufficient documentation

## 2020-01-21 DIAGNOSIS — N401 Enlarged prostate with lower urinary tract symptoms: Secondary | ICD-10-CM | POA: Insufficient documentation

## 2020-01-21 DIAGNOSIS — N400 Enlarged prostate without lower urinary tract symptoms: Secondary | ICD-10-CM | POA: Insufficient documentation

## 2020-01-25 DIAGNOSIS — Z03818 Encounter for observation for suspected exposure to other biological agents ruled out: Secondary | ICD-10-CM | POA: Diagnosis not present

## 2020-01-28 ENCOUNTER — Ambulatory Visit (INDEPENDENT_AMBULATORY_CARE_PROVIDER_SITE_OTHER): Payer: Medicare HMO | Admitting: Physical Medicine and Rehabilitation

## 2020-01-28 ENCOUNTER — Other Ambulatory Visit: Payer: Self-pay

## 2020-01-28 VITALS — BP 118/73 | HR 68

## 2020-01-28 DIAGNOSIS — M7918 Myalgia, other site: Secondary | ICD-10-CM | POA: Diagnosis not present

## 2020-01-28 DIAGNOSIS — M47812 Spondylosis without myelopathy or radiculopathy, cervical region: Secondary | ICD-10-CM | POA: Diagnosis not present

## 2020-01-28 DIAGNOSIS — M542 Cervicalgia: Secondary | ICD-10-CM | POA: Diagnosis not present

## 2020-01-28 NOTE — Progress Notes (Signed)
Neck pain in middle and to right side when looking up. Tylenol helps with pain. Numeric Pain Rating Scale and Functional Assessment Average Pain 5   In the last MONTH (on 0-10 scale) has pain interfered with the following?  1. General activity like being  able to carry out your everyday physical activities such as walking, climbing stairs, carrying groceries, or moving a chair?  Rating(0)

## 2020-01-29 ENCOUNTER — Encounter: Payer: Self-pay | Admitting: Physical Medicine and Rehabilitation

## 2020-01-29 DIAGNOSIS — I10 Essential (primary) hypertension: Secondary | ICD-10-CM | POA: Diagnosis not present

## 2020-01-29 DIAGNOSIS — F3341 Major depressive disorder, recurrent, in partial remission: Secondary | ICD-10-CM | POA: Diagnosis not present

## 2020-01-29 DIAGNOSIS — R600 Localized edema: Secondary | ICD-10-CM | POA: Diagnosis not present

## 2020-01-29 DIAGNOSIS — N1831 Chronic kidney disease, stage 3a: Secondary | ICD-10-CM | POA: Diagnosis not present

## 2020-01-29 DIAGNOSIS — E1129 Type 2 diabetes mellitus with other diabetic kidney complication: Secondary | ICD-10-CM | POA: Diagnosis not present

## 2020-01-29 DIAGNOSIS — E782 Mixed hyperlipidemia: Secondary | ICD-10-CM | POA: Diagnosis not present

## 2020-01-29 DIAGNOSIS — Z23 Encounter for immunization: Secondary | ICD-10-CM | POA: Diagnosis not present

## 2020-01-29 DIAGNOSIS — I25810 Atherosclerosis of coronary artery bypass graft(s) without angina pectoris: Secondary | ICD-10-CM | POA: Diagnosis not present

## 2020-01-29 MED ORDER — TRIAMCINOLONE ACETONIDE 40 MG/ML IJ SUSP
40.0000 mg | INTRAMUSCULAR | Status: AC | PRN
  Administered 2020-01-28: 40 mg via INTRAMUSCULAR

## 2020-01-29 MED ORDER — LIDOCAINE HCL (PF) 1 % IJ SOLN
2.0000 mL | INTRAMUSCULAR | Status: AC | PRN
  Administered 2020-01-28: 2 mL

## 2020-01-29 NOTE — Progress Notes (Signed)
[name] - 85 y.o. male MRN [id number]  Date of birth: [birth year]  Office Visit Note: Visit Date: 01/28/2020 PCP: Marton Redwood, MD Referred by: Marton Redwood, MD  Subjective: Chief Complaint  Patient presents with  . Neck - Pain   HPI: [name] is a 85 y.o. male who comes in today Evaluation management of right sided neck pain which has been a chronic issue for him for some time but recently over the last several months has significantly worsened.  His average pain is a 5 out of 10 but it really does decrease what he would like to be able to do really affects his sleep at night as well.  No specific night pain.  No red flag complaints of radicular symptoms or paresthesias down the hands or arms.  No focal weakness.  No prior history of cervical surgery.  He is typically followed by Dr. Jean Rosenthal and Benita Stabile, P.A.-C for his orthopedic complaints.  I have seen him in September for hip injection he had multiple complaints of other pain at that point and wanted to talk to me about his neck.  We are seeing him back today for his neck pain.  He reports Tylenol does help when he takes it.  The neck pain is really middle and right side really at the base of the neck lateral to the C7 spinous process.  Worse with forward flexion extension not as bad with rotation.  Benita Stabile had obtained cervical spine x-rays and those were reviewed with the patient today.  Basically shows a forward flexed cervical spine with some straightening of the normal normal lordosis without a great deal of listhesis although it C4 on C5 there appears to be a small listhesis.  He has significant facet arthropathy on the right at C4-5 and C5-6 on the left a little higher up.  Uncovertebral joint hypertrophy as well.  He has not had MRI of the cervical spine.  He has not had cervical surgery.  His pain has been progressive and insidious without specific event causing his pain.  He has had therapy he has had  medication management to a degree and he has given this a lot of time without much relief.  Review of Systems  Musculoskeletal: Positive for neck pain.  All other systems reviewed and are negative.  Otherwise per HPI.  Assessment & Plan: Visit Diagnoses:  1. Cervicalgia   2. Cervical spondylosis without myelopathy   3. Myofascial pain syndrome     Plan: Findings:  Chronic history of neck pain and shoulder pain and back pain and hip pain.  As noted in the HPI.  Today biggest complaint right sided neck pain really at the base of the neck on the right at the C7 spinous process or just above.  Focal tenderness at the musculotendinous tying and with pain with forward flexion and extension.  Negative Hoffmann's in general.  X-rays noted significant facet arthropathy throughout the cervical spine both right and left.  Patient's pain seemingly is partly myofascial or muscle tendon and somewhat facet mediated pain.  No radicular nature to his pain down the arms.  Today completed local targeted cortisone injection along the muscle tendon juncture.  Depending on relief which the patient will let us know in a week how that is doing would look at potential for facet joint block diagnostically.  This would be a diagnostic injection fluoroscopic guidance.  If it were to declare itself more radicular in nature would  look at MRI of the cervical spine would look at MRI of the cervical spine in the future just if he was not getting much relief.  I did give him home exercises with strengthening of the cervical spine as a way to combat the increasing forward flexion and muscle dysfunction.  He has had physical therapy in the past but without dry needling.    Meds & Orders: No orders of the defined types were placed in this encounter.   Orders Placed This Encounter  Procedures  . Trigger Point Inj    Follow-up: No follow-ups on file.   Procedures: Levator scapula and paraspinal muscle tendon Inj  Date/Time:  01/28/2020 10:00 AM Performed by: Magnus Sinning, MD Authorized by: Magnus Sinning, MD   Consent Given by:  Patient Site marked: the procedure site was marked   Timeout: prior to procedure the correct patient, procedure, and site was verified   Indications:  Pain Total # of Trigger Points:  1 Location: neck and back   Needle Size:  25 G Approach:  Dorsal Medications #1:  40 mg triamcinolone acetonide 40 MG/ML; 2 mL lidocaine (PF) 1 % Patient tolerance:  Patient tolerated the procedure well with no immediate complications Comments: Muscle tendon peritendinous cortisone injection on the right located along the levator scapula tendon just lateral to the C7 spinous process.  Very mild injection with 25-gauge needle but patient with fairly exaggerated response to even numbing the skin and needle insertion into the muscle.  Patient however discharged doing well.    No notes on file   Clinical History: MRI LUMBAR SPINE WITHOUT CONTRAST  TECHNIQUE: Multiplanar, multisequence MR imaging of the lumbar spine was performed. No intravenous contrast was administered.  COMPARISON:  Lumbar MRI 12/28/2017  FINDINGS: Segmentation:  Normal  Alignment: 4 mm anterolisthesis L4-5 unchanged. Remaining alignment normal.  Vertebrae:  Negative for fracture or mass.  No bone marrow edema.  Conus medullaris and cauda equina: Conus extends to the L1 level. Conus and cauda equina appear normal.  Paraspinal and other soft tissues: Negative for paraspinous mass or adenopathy. No it soft tissue edema or fluid collection.  Disc levels:  T12-L1: Negative  L1-2: Mild disc and mild facet degeneration. Negative for disc protrusion or stenosis.  L2-3: Diffuse disc bulging and endplate spurring. Bilateral facet hypertrophy. Mild spinal stenosis and moderate subarticular stenosis bilaterally.  L3-4: Disc degeneration with diffuse disc bulging and endplate spurring. Bilateral facet  hypertrophy. Mild spinal stenosis and moderate subarticular stenosis bilaterally due to spurring. . Interval resolution of small synovial cyst on the left.  L4-5: 4 mm anterolisthesis with advanced facet degeneration. Disc degeneration with diffuse disc bulging. Moderate spinal stenosis. Moderate to severe subarticular stenosis bilaterally. No change since the prior MRI.  L5-S1: Disc degeneration with diffuse endplate spurring. Mild facet degeneration. Moderate subarticular and foraminal stenosis on the right. Mild subarticular stenosis on the left.  IMPRESSION: Multilevel degenerative change throughout the lumbar spine similar to the prior MRI. There is resolution of the left-sided small synovial cyst at L3-4. No new or acute findings.   Electronically Signed   By: Franchot Gallo M.D.   On: 10/13/2019 09:37   He reports that he has never smoked. He has never used smokeless tobacco. No results for input(s): HGBA1C, LABURIC in the last 8760 hours.  Objective:  VS:  HT:    WT:   BMI:     BP:118/73  HR:68bpm  TEMP: ( )  RESP:  Physical Exam Vitals and nursing  note reviewed.  Constitutional:      General: He is not in acute distress.    Appearance: Normal appearance. He is not ill-appearing.  HENT:     Head: Normocephalic and atraumatic.     Right Ear: External ear normal.     Left Ear: External ear normal.  Eyes:     Extraocular Movements: Extraocular movements intact.  Neck:     Comments: Examination of the neck shows decreased range of motion more prominent to the right than the left.  He does have pain with extension of the cervical spine. Cardiovascular:     Rate and Rhythm: Normal rate.     Pulses: Normal pulses.  Abdominal:     General: There is no distension.     Palpations: Abdomen is soft.  Musculoskeletal:        General: No signs of injury.     Cervical back: Neck supple. Tenderness present. No rigidity.     Right lower leg: No edema.     Left lower  leg: No edema.     Comments: Patient has good strength in the upper extremities with 5 out of 5 strength in wrist extension long finger flexion APB.  No intrinsic hand muscle atrophy.  Negative Hoffmann's test.  He sits with a forward flexed cervical spine.  Range of motion is noted in neck exam.  Does have significant tenderness along the levator scapula trapezius musculature just to the right of the C7 spinous process.  Lymphadenopathy:     Cervical: No cervical adenopathy.  Skin:    Findings: No erythema or rash.  Neurological:     General: No focal deficit present.     Mental Status: He is alert and oriented to person, place, and time.     Sensory: No sensory deficit.     Motor: No weakness or abnormal muscle tone.     Coordination: Coordination normal.  Psychiatric:        Mood and Affect: Mood normal.        Behavior: Behavior normal.     Ortho Exam  Imaging: No results found.  Past Medical/Family/Surgical/Social History: Medications & Allergies reviewed per EMR, new medications updated. Patient Active Problem List   Diagnosis Date Noted  . Personal history of colonic adenomas 03/26/2013  . OBSTRUCTIVE SLEEP APNEA 10/30/2008  . HYPERLIPIDEMIA-MIXED 07/01/2008  . HYPERTENSION, BENIGN 07/01/2008  . CAD, NATIVE VESSEL 07/01/2008  . FATIGUE / MALAISE 07/01/2008  . EDEMA 07/01/2008   Past Medical History:  Diagnosis Date  . Acute renal failure ([place])    secondary to nephrotic syndrome post cath, resolved with steroids  . CAD (coronary artery disease) January 2008   LIMA to LAD, sequential SVG to first, second, and third OM's, sequential SVG to mid RCA and PDA   . Diabetes mellitus without complication ([place])   . HTN (hypertension)   . Hx of adenomatous colonic polyps   . Hyperlipidemia    Family History  Problem Relation Age of Onset  . Heart attack Mother   . Heart disease Sister   . Clotting disorder Father        died of renal disease  . Colon cancer Neg Hx     Past Surgical History:  Procedure Laterality Date  . COLONOSCOPY W/ BIOPSIES AND POLYPECTOMY    . CORONARY ARTERY BYPASS GRAFT  2008   VESSELS X6  . UMBILICAL HERNIA REPAIR     Social History   Occupational History  . Occupation: Surveyor, mining  Tobacco Use  . Smoking status: Never Smoker  . Smokeless tobacco: Never Used  Substance and Sexual Activity  . Alcohol use: No    Alcohol/week: 1.0 standard drink    Types: 1 Glasses of wine per week  . Drug use: No  . Sexual activity: Not on file

## 2020-02-05 DIAGNOSIS — H5212 Myopia, left eye: Secondary | ICD-10-CM | POA: Diagnosis not present

## 2020-03-19 ENCOUNTER — Other Ambulatory Visit: Payer: Self-pay | Admitting: Internal Medicine

## 2020-03-19 ENCOUNTER — Other Ambulatory Visit: Payer: Self-pay

## 2020-03-19 ENCOUNTER — Ambulatory Visit (HOSPITAL_BASED_OUTPATIENT_CLINIC_OR_DEPARTMENT_OTHER)
Admission: RE | Admit: 2020-03-19 | Discharge: 2020-03-19 | Disposition: A | Discharge status: Home / Self Care | Payer: Medicare HMO | Source: Ambulatory Visit | Attending: Internal Medicine | Admitting: Internal Medicine

## 2020-03-19 DIAGNOSIS — R1084 Generalized abdominal pain: Secondary | ICD-10-CM | POA: Diagnosis not present

## 2020-03-19 DIAGNOSIS — K5900 Constipation, unspecified: Secondary | ICD-10-CM | POA: Diagnosis not present

## 2020-03-19 DIAGNOSIS — R109 Unspecified abdominal pain: Secondary | ICD-10-CM | POA: Diagnosis not present

## 2020-03-19 DIAGNOSIS — E1129 Type 2 diabetes mellitus with other diabetic kidney complication: Secondary | ICD-10-CM | POA: Diagnosis not present

## 2020-03-19 MED ORDER — IOHEXOL 300 MG/ML  SOLN
100.0000 mL | Freq: Once | INTRAMUSCULAR | Status: AC | PRN
  Administered 2020-03-19: 75 mL via INTRAVENOUS

## 2020-03-30 DIAGNOSIS — I10 Essential (primary) hypertension: Secondary | ICD-10-CM | POA: Diagnosis not present

## 2020-05-13 ENCOUNTER — Telehealth: Payer: Self-pay | Admitting: *Deleted

## 2020-05-13 NOTE — Telephone Encounter (Signed)
Called to discuss with patient about COVID-19 symptoms and the use of one of the available treatments for those with mild to moderate Covid symptoms and at a high risk of hospitalization.  Pt appears to qualify for outpatient treatment due to co-morbid conditions and/or a member of an at-risk group in accordance with the FDA Emergency Use Authorization.   Spoke with Magda Paganini, Mr. Fentress's daughter. Reported he has a lot of chest congestion today.   Symptom onset: 05/10/20 today is day 4 of symptoms per his daughter.  Vaccinated: Yes Booster? Yes Immunocompromised? No  Qualifiers: HTN, DM Type 2, decreased kidney function per the daughter.   She is notifying Mr. Temme to expect a call from a provider most likely this afternoon.   Tarry Kos

## 2020-05-27 DIAGNOSIS — E1122 Type 2 diabetes mellitus with diabetic chronic kidney disease: Secondary | ICD-10-CM | POA: Diagnosis not present

## 2020-05-27 DIAGNOSIS — R351 Nocturia: Secondary | ICD-10-CM | POA: Diagnosis not present

## 2020-05-27 DIAGNOSIS — R5383 Other fatigue: Secondary | ICD-10-CM | POA: Diagnosis not present

## 2020-05-27 DIAGNOSIS — R2681 Unsteadiness on feet: Secondary | ICD-10-CM | POA: Diagnosis not present

## 2020-06-18 DIAGNOSIS — I25810 Atherosclerosis of coronary artery bypass graft(s) without angina pectoris: Secondary | ICD-10-CM | POA: Diagnosis not present

## 2020-06-18 DIAGNOSIS — N1831 Chronic kidney disease, stage 3a: Secondary | ICD-10-CM | POA: Diagnosis not present

## 2020-06-18 DIAGNOSIS — E782 Mixed hyperlipidemia: Secondary | ICD-10-CM | POA: Diagnosis not present

## 2020-06-18 DIAGNOSIS — R600 Localized edema: Secondary | ICD-10-CM | POA: Diagnosis not present

## 2020-06-18 DIAGNOSIS — R2681 Unsteadiness on feet: Secondary | ICD-10-CM | POA: Diagnosis not present

## 2020-06-18 DIAGNOSIS — E1129 Type 2 diabetes mellitus with other diabetic kidney complication: Secondary | ICD-10-CM | POA: Diagnosis not present

## 2020-06-18 DIAGNOSIS — F3341 Major depressive disorder, recurrent, in partial remission: Secondary | ICD-10-CM | POA: Diagnosis not present

## 2020-06-18 DIAGNOSIS — R5383 Other fatigue: Secondary | ICD-10-CM | POA: Diagnosis not present

## 2020-06-18 DIAGNOSIS — I10 Essential (primary) hypertension: Secondary | ICD-10-CM | POA: Diagnosis not present

## 2020-07-01 DIAGNOSIS — D485 Neoplasm of uncertain behavior of skin: Secondary | ICD-10-CM | POA: Diagnosis not present

## 2020-07-01 DIAGNOSIS — C44219 Basal cell carcinoma of skin of left ear and external auricular canal: Secondary | ICD-10-CM | POA: Diagnosis not present

## 2020-07-01 DIAGNOSIS — C44319 Basal cell carcinoma of skin of other parts of face: Secondary | ICD-10-CM | POA: Diagnosis not present

## 2020-07-01 DIAGNOSIS — Z85828 Personal history of other malignant neoplasm of skin: Secondary | ICD-10-CM | POA: Diagnosis not present

## 2020-07-01 DIAGNOSIS — L821 Other seborrheic keratosis: Secondary | ICD-10-CM | POA: Diagnosis not present

## 2020-07-01 DIAGNOSIS — L905 Scar conditions and fibrosis of skin: Secondary | ICD-10-CM | POA: Diagnosis not present

## 2020-07-01 DIAGNOSIS — L853 Xerosis cutis: Secondary | ICD-10-CM | POA: Diagnosis not present

## 2020-07-14 DIAGNOSIS — C44319 Basal cell carcinoma of skin of other parts of face: Secondary | ICD-10-CM | POA: Diagnosis not present

## 2020-07-14 DIAGNOSIS — C44219 Basal cell carcinoma of skin of left ear and external auricular canal: Secondary | ICD-10-CM | POA: Diagnosis not present

## 2020-07-21 ENCOUNTER — Telehealth: Payer: Self-pay | Admitting: Physical Medicine and Rehabilitation

## 2020-07-21 NOTE — Telephone Encounter (Signed)
Patient has been seen by Dr. Ernestina Patches for neck pain and hip/ groin pain as well as low back and posterior hip pain. Left message #1 asking patient to call back to discuss location of pain.

## 2020-07-21 NOTE — Telephone Encounter (Signed)
Patient called needing an appointment with dr Ernestina Patches for his back   The number to contact patient is [phone]

## 2020-07-21 NOTE — Telephone Encounter (Signed)
Pt called returning Courtney's call and states the pain is where it always is when he comes in; and that was all he was comfortable disclosing with me.   [phone]

## 2020-07-21 NOTE — Telephone Encounter (Signed)
Called patient back to get more information about location of pain. Patient states that pain is in his legs. Unable to tell me if the pain is more in the front/ groin or posterior. Scheduled for OV.

## 2020-08-03 ENCOUNTER — Telehealth: Payer: Self-pay | Admitting: Physical Medicine and Rehabilitation

## 2020-08-03 NOTE — Telephone Encounter (Signed)
Called pt and inform her with appt.

## 2020-08-03 NOTE — Telephone Encounter (Signed)
Patient called stating he received a text message about an appt for his pains. Patient is asking for Sunday Corn to return hs phone call so they can discuss his next steps. Patient states his pains are in his hips and has come in several times. Please call patient at [phone].

## 2020-08-05 ENCOUNTER — Ambulatory Visit: Payer: Medicare HMO | Admitting: Physical Medicine and Rehabilitation

## 2020-08-05 ENCOUNTER — Encounter: Payer: Self-pay | Admitting: Physical Medicine and Rehabilitation

## 2020-08-05 ENCOUNTER — Other Ambulatory Visit: Payer: Self-pay

## 2020-08-05 VITALS — BP 148/85 | HR 72

## 2020-08-05 DIAGNOSIS — M25552 Pain in left hip: Secondary | ICD-10-CM | POA: Diagnosis not present

## 2020-08-05 DIAGNOSIS — M47816 Spondylosis without myelopathy or radiculopathy, lumbar region: Secondary | ICD-10-CM

## 2020-08-05 DIAGNOSIS — M25551 Pain in right hip: Secondary | ICD-10-CM | POA: Diagnosis not present

## 2020-08-05 DIAGNOSIS — M48061 Spinal stenosis, lumbar region without neurogenic claudication: Secondary | ICD-10-CM

## 2020-08-05 DIAGNOSIS — R2681 Unsteadiness on feet: Secondary | ICD-10-CM | POA: Insufficient documentation

## 2020-08-05 NOTE — Progress Notes (Signed)
[name] - 86 y.o. male MRN [id number]  Date of birth: 08/08/1934  Office Visit Note: Visit Date: 08/05/2020 PCP: Ginger Organ., MD Referred by: Ginger Organ., MD  Subjective: Chief Complaint  Patient presents with   Lower Back - Pain   Right Hip - Pain   Left Hip - Pain   HPI: [name] is a 86 y.o. male who comes in today For evaluation and management of chronic low back pain and right more than left hip and groin pain.  Patient is followed by Dr. Jean Rosenthal and Benita Stabile, P.A.-C in our office from an orthopedic standpoint.  He is also followed pretty closely by his primary care physician Dr. Brigitte Pulse.  The last time we saw the patient was more for evaluation of neck pain we completed trigger point injections and he went on to have some relief with that and did start talking about obtaining some physical therapy for his neck.  He did not have MRI of the cervical spine at that time and did not seem to require it.  He comes in today with again severe intermittent low back and right more than left hip and groin pain.  We have completed 2 intra-articular hip injections 1 in 2019 and 04/2019 with decent relief of symptoms at the time.  He has not had epidural injection.  He has an MRI of the lumbar spine showing multi factorial stenosis at L4-5 which is moderate with listhesis and facet arthropathy at that level.  No high-grade stenosis or nerve compression.  His symptoms today did refer into the buttock area but also some to the groin.  Hip x-rays by Dr. Ninfa Linden have been unrevealing.  Diagnostically however the hip injections did seem to help.  He has no tingling or paresthesias.  He has no focal weakness.  No prior history of lumbar surgery.  Remote history of some therapy.  He does get pain going from sit to stand.  He reports worsening in all positions.  When he called to make the appointment he was averaging 6 out of 10 pain but now only averaging about 3 out of  10.  He has had no new trauma or bowel or bladder changes.  He has not seen Dr. Ninfa Linden recently.  Review of Systems  Musculoskeletal:  Positive for back pain and joint pain.  Otherwise per HPI.  Assessment & Plan: Visit Diagnoses:    ICD-10-CM   1. Spondylosis without myelopathy or radiculopathy, lumbar region  M47.816     2. Spinal stenosis of lumbar region without neurogenic claudication  M48.061     3. Pain in right hip  M25.551     4. Pain in left hip  M25.552        Plan: Findings:  1.  Right more than left hip pain that seems to at least to some degree to be intra-articular.  This could be related to more of a cartilage or labrum issue since x-rays have been unrevealing at his age.  He has not had MRI of the hips.  He has had diagnostic hip injections which were beneficial on the right on 2 occasions.  He does have some symptoms suggestive of that with going from sit to stand and groin pain.  But no real pain on rotation.  He reports pain level has decreased and does not really want necessarily an injection at this point.  2.  Patient does have multilevel stenosis at L4-5 which  is moderate with facet arthropathy and small listhesis.  I would imagine this is causing some symptoms with standing and walking.  Typically would not worsen from sit to stand but the facet joints could give him pain going from sit to stand.  He has not had epidural injection or facet joint injection.  Again symptoms are somewhat better at this point he wants to hold off.  We had a pretty lengthy discussion about trying the epidural injection diagnostically and how that works and the risk and benefits.   Meds & Orders: No orders of the defined types were placed in this encounter.  No orders of the defined types were placed in this encounter.   Follow-up: No follow-ups on file.   Procedures: No procedures performed      Clinical History: MRI LUMBAR SPINE WITHOUT CONTRAST   TECHNIQUE: Multiplanar,  multisequence MR imaging of the lumbar spine was performed. No intravenous contrast was administered.   COMPARISON:  Lumbar MRI 12/28/2017   FINDINGS: Segmentation:  Normal   Alignment: 4 mm anterolisthesis L4-5 unchanged. Remaining alignment normal.   Vertebrae:  Negative for fracture or mass.  No bone marrow edema.   Conus medullaris and cauda equina: Conus extends to the L1 level. Conus and cauda equina appear normal.   Paraspinal and other soft tissues: Negative for paraspinous mass or adenopathy. No it soft tissue edema or fluid collection.   Disc levels:   T12-L1: Negative   L1-2: Mild disc and mild facet degeneration. Negative for disc protrusion or stenosis.   L2-3: Diffuse disc bulging and endplate spurring. Bilateral facet hypertrophy. Mild spinal stenosis and moderate subarticular stenosis bilaterally.   L3-4: Disc degeneration with diffuse disc bulging and endplate spurring. Bilateral facet hypertrophy. Mild spinal stenosis and moderate subarticular stenosis bilaterally due to spurring. . Interval resolution of small synovial cyst on the left.   L4-5: 4 mm anterolisthesis with advanced facet degeneration. Disc degeneration with diffuse disc bulging. Moderate spinal stenosis. Moderate to severe subarticular stenosis bilaterally. No change since the prior MRI.   L5-S1: Disc degeneration with diffuse endplate spurring. Mild facet degeneration. Moderate subarticular and foraminal stenosis on the right. Mild subarticular stenosis on the left.   IMPRESSION: Multilevel degenerative change throughout the lumbar spine similar to the prior MRI. There is resolution of the left-sided small synovial cyst at L3-4. No new or acute findings.     Electronically Signed   By: Franchot Gallo M.D.   On: 10/13/2019 09:37   He reports that he has never smoked. He has never used smokeless tobacco. No results for input(s): HGBA1C, LABURIC in the last 8760 hours.  Objective:   VS:  HT:    WT:   BMI:     BP:(!) 148/85  HR:72bpm  TEMP: ( )  RESP:  Physical Exam Vitals and nursing note reviewed.  Constitutional:      General: He is not in acute distress.    Appearance: Normal appearance. He is not ill-appearing.  HENT:     Head: Normocephalic and atraumatic.     Right Ear: External ear normal.     Left Ear: External ear normal.     Nose: No congestion.  Eyes:     Extraocular Movements: Extraocular movements intact.  Cardiovascular:     Rate and Rhythm: Normal rate.     Pulses: Normal pulses.  Pulmonary:     Effort: Pulmonary effort is normal. No respiratory distress.  Abdominal:     General: There is no distension.  Palpations: Abdomen is soft.  Musculoskeletal:        General: No tenderness or signs of injury.     Cervical back: Neck supple.     Right lower leg: No edema.     Left lower leg: No edema.     Comments: Patient has good distal strength without clonus.  He has no pain over the greater trochanters.  He has some pain at way in range of internal rotation on the right but not the left hip.  He does have pain going from sit to stand in full extension and facet loading.  Skin:    Findings: No erythema or rash.  Neurological:     General: No focal deficit present.     Mental Status: He is alert and oriented to person, place, and time.     Sensory: No sensory deficit.     Motor: No weakness or abnormal muscle tone.     Coordination: Coordination normal.  Psychiatric:        Mood and Affect: Mood normal.        Behavior: Behavior normal.    Ortho Exam  Imaging: No results found.  Past Medical/Family/Surgical/Social History: Medications & Allergies reviewed per EMR, new medications updated. Patient Active Problem List   Diagnosis Date Noted   Unsteady gait 08/05/2020   Anxiety disorder 02/02/2018   Right hip pain 10/02/2017   Recurrent major depression in remission ([place]) 01/27/2017   Chronic kidney disease, stage 3a ([place])  07/17/2015   Personal history of colonic adenomas 03/26/2013   Obesity 10/04/2012   OBSTRUCTIVE SLEEP APNEA 10/30/2008   HYPERLIPIDEMIA-MIXED 07/01/2008   HYPERTENSION, BENIGN 07/01/2008   CAD, NATIVE VESSEL 07/01/2008   FATIGUE / MALAISE 07/01/2008   EDEMA 07/01/2008   Past Medical History:  Diagnosis Date   Acute renal failure (HCC)    secondary to nephrotic syndrome post cath, resolved with steroids   CAD (coronary artery disease) January 2008   LIMA to LAD, sequential SVG to first, second, and third OM's, sequential SVG to mid RCA and PDA    Diabetes mellitus without complication ([place])    HTN (hypertension)    Hx of adenomatous colonic polyps    Hyperlipidemia    Family History  Problem Relation Age of Onset   Heart attack Mother    Heart disease Sister    Clotting disorder Father        died of renal disease   Colon cancer Neg Hx    Past Surgical History:  Procedure Laterality Date   COLONOSCOPY W/ BIOPSIES AND POLYPECTOMY     CORONARY ARTERY BYPASS GRAFT  2008   VESSELS X6   UMBILICAL HERNIA REPAIR     Social History   Occupational History   Occupation: Surveyor, mining  Tobacco Use   Smoking status: Never   Smokeless tobacco: Never  Substance and Sexual Activity   Alcohol use: No    Alcohol/week: 1.0 standard drink    Types: 1 Glasses of wine per week   Drug use: No   Sexual activity: Not on file

## 2020-08-05 NOTE — Progress Notes (Signed)
Pt state pain in both of his groin area. Pt state walking, standing and sitting makes the pain worse. Pt state when getting up from siting position it makes the pain worse. Pt state he take over the counter pain meds to help ease his pain.  Numeric Pain Rating Scale and Functional Assessment Average Pain 6 Pain Right Now 3 My pain is intermittent and stabbing Pain is worse with: walking, sitting, standing and some activites Pain improves with: medication   In the last MONTH (on 0-10 scale) has pain interfered with the following?  1. General activity like being  able to carry out your everyday physical activities such as walking, climbing stairs, carrying groceries, or moving a chair?  Rating(7)  2. Relation with others like being able to carry out your usual social activities and roles such as  activities at home, at work and in your community. Rating(8)  3. Enjoyment of life such that you have  been bothered by emotional problems such as feeling anxious, depressed or irritable?  Rating(9)

## 2020-10-06 DIAGNOSIS — Z125 Encounter for screening for malignant neoplasm of prostate: Secondary | ICD-10-CM | POA: Diagnosis not present

## 2020-10-06 DIAGNOSIS — K5900 Constipation, unspecified: Secondary | ICD-10-CM | POA: Diagnosis not present

## 2020-10-06 DIAGNOSIS — E782 Mixed hyperlipidemia: Secondary | ICD-10-CM | POA: Diagnosis not present

## 2020-10-06 DIAGNOSIS — E1129 Type 2 diabetes mellitus with other diabetic kidney complication: Secondary | ICD-10-CM | POA: Diagnosis not present

## 2020-10-13 DIAGNOSIS — Z1389 Encounter for screening for other disorder: Secondary | ICD-10-CM | POA: Diagnosis not present

## 2020-10-13 DIAGNOSIS — Z1331 Encounter for screening for depression: Secondary | ICD-10-CM | POA: Diagnosis not present

## 2020-10-13 DIAGNOSIS — E782 Mixed hyperlipidemia: Secondary | ICD-10-CM | POA: Diagnosis not present

## 2020-10-13 DIAGNOSIS — N1831 Chronic kidney disease, stage 3a: Secondary | ICD-10-CM | POA: Diagnosis not present

## 2020-10-13 DIAGNOSIS — R2681 Unsteadiness on feet: Secondary | ICD-10-CM | POA: Diagnosis not present

## 2020-10-13 DIAGNOSIS — E1129 Type 2 diabetes mellitus with other diabetic kidney complication: Secondary | ICD-10-CM | POA: Diagnosis not present

## 2020-10-13 DIAGNOSIS — F3341 Major depressive disorder, recurrent, in partial remission: Secondary | ICD-10-CM | POA: Diagnosis not present

## 2020-10-13 DIAGNOSIS — I10 Essential (primary) hypertension: Secondary | ICD-10-CM | POA: Diagnosis not present

## 2020-10-13 DIAGNOSIS — R82998 Other abnormal findings in urine: Secondary | ICD-10-CM | POA: Diagnosis not present

## 2020-10-13 DIAGNOSIS — Z0000 Encounter for general adult medical examination without abnormal findings: Secondary | ICD-10-CM | POA: Diagnosis not present

## 2020-10-13 DIAGNOSIS — I25810 Atherosclerosis of coronary artery bypass graft(s) without angina pectoris: Secondary | ICD-10-CM | POA: Diagnosis not present

## 2020-10-13 DIAGNOSIS — I700 Atherosclerosis of aorta: Secondary | ICD-10-CM | POA: Diagnosis not present

## 2020-11-11 DIAGNOSIS — M545 Low back pain, unspecified: Secondary | ICD-10-CM | POA: Diagnosis not present

## 2020-11-11 DIAGNOSIS — M532X3 Spinal instabilities, cervicothoracic region: Secondary | ICD-10-CM | POA: Diagnosis not present

## 2020-11-11 DIAGNOSIS — R2689 Other abnormalities of gait and mobility: Secondary | ICD-10-CM | POA: Diagnosis not present

## 2020-11-12 ENCOUNTER — Ambulatory Visit (INDEPENDENT_AMBULATORY_CARE_PROVIDER_SITE_OTHER): Payer: Medicare HMO | Admitting: Psychology

## 2020-11-12 DIAGNOSIS — F419 Anxiety disorder, unspecified: Secondary | ICD-10-CM

## 2020-11-12 DIAGNOSIS — F320 Major depressive disorder, single episode, mild: Secondary | ICD-10-CM

## 2020-11-15 DIAGNOSIS — I129 Hypertensive chronic kidney disease with stage 1 through stage 4 chronic kidney disease, or unspecified chronic kidney disease: Secondary | ICD-10-CM | POA: Diagnosis not present

## 2020-11-15 DIAGNOSIS — N1831 Chronic kidney disease, stage 3a: Secondary | ICD-10-CM | POA: Diagnosis not present

## 2020-11-15 DIAGNOSIS — E782 Mixed hyperlipidemia: Secondary | ICD-10-CM | POA: Diagnosis not present

## 2020-11-15 DIAGNOSIS — E1122 Type 2 diabetes mellitus with diabetic chronic kidney disease: Secondary | ICD-10-CM | POA: Diagnosis not present

## 2020-11-17 DIAGNOSIS — M545 Low back pain, unspecified: Secondary | ICD-10-CM | POA: Diagnosis not present

## 2020-11-17 DIAGNOSIS — M532X3 Spinal instabilities, cervicothoracic region: Secondary | ICD-10-CM | POA: Diagnosis not present

## 2020-11-17 DIAGNOSIS — R2689 Other abnormalities of gait and mobility: Secondary | ICD-10-CM | POA: Diagnosis not present

## 2020-11-19 DIAGNOSIS — M532X3 Spinal instabilities, cervicothoracic region: Secondary | ICD-10-CM | POA: Diagnosis not present

## 2020-11-19 DIAGNOSIS — M545 Low back pain, unspecified: Secondary | ICD-10-CM | POA: Diagnosis not present

## 2020-11-19 DIAGNOSIS — R2689 Other abnormalities of gait and mobility: Secondary | ICD-10-CM | POA: Diagnosis not present

## 2020-11-23 DIAGNOSIS — M532X3 Spinal instabilities, cervicothoracic region: Secondary | ICD-10-CM | POA: Diagnosis not present

## 2020-11-23 DIAGNOSIS — R2689 Other abnormalities of gait and mobility: Secondary | ICD-10-CM | POA: Diagnosis not present

## 2020-11-23 DIAGNOSIS — M545 Low back pain, unspecified: Secondary | ICD-10-CM | POA: Diagnosis not present

## 2020-11-25 ENCOUNTER — Ambulatory Visit (INDEPENDENT_AMBULATORY_CARE_PROVIDER_SITE_OTHER): Payer: Medicare HMO | Admitting: Psychology

## 2020-11-25 DIAGNOSIS — M545 Low back pain, unspecified: Secondary | ICD-10-CM | POA: Diagnosis not present

## 2020-11-25 DIAGNOSIS — F411 Generalized anxiety disorder: Secondary | ICD-10-CM

## 2020-11-25 DIAGNOSIS — F331 Major depressive disorder, recurrent, moderate: Secondary | ICD-10-CM | POA: Diagnosis not present

## 2020-11-25 DIAGNOSIS — M532X3 Spinal instabilities, cervicothoracic region: Secondary | ICD-10-CM | POA: Diagnosis not present

## 2020-11-25 DIAGNOSIS — R2689 Other abnormalities of gait and mobility: Secondary | ICD-10-CM | POA: Diagnosis not present

## 2020-12-02 DIAGNOSIS — R2689 Other abnormalities of gait and mobility: Secondary | ICD-10-CM | POA: Diagnosis not present

## 2020-12-02 DIAGNOSIS — M545 Low back pain, unspecified: Secondary | ICD-10-CM | POA: Diagnosis not present

## 2020-12-02 DIAGNOSIS — M532X3 Spinal instabilities, cervicothoracic region: Secondary | ICD-10-CM | POA: Diagnosis not present

## 2020-12-04 DIAGNOSIS — M532X3 Spinal instabilities, cervicothoracic region: Secondary | ICD-10-CM | POA: Diagnosis not present

## 2020-12-04 DIAGNOSIS — M545 Low back pain, unspecified: Secondary | ICD-10-CM | POA: Diagnosis not present

## 2020-12-04 DIAGNOSIS — R2689 Other abnormalities of gait and mobility: Secondary | ICD-10-CM | POA: Diagnosis not present

## 2020-12-10 ENCOUNTER — Ambulatory Visit (INDEPENDENT_AMBULATORY_CARE_PROVIDER_SITE_OTHER): Payer: Medicare HMO | Admitting: Psychology

## 2020-12-10 DIAGNOSIS — F331 Major depressive disorder, recurrent, moderate: Secondary | ICD-10-CM | POA: Diagnosis not present

## 2020-12-10 DIAGNOSIS — F411 Generalized anxiety disorder: Secondary | ICD-10-CM

## 2020-12-15 ENCOUNTER — Ambulatory Visit (INDEPENDENT_AMBULATORY_CARE_PROVIDER_SITE_OTHER): Payer: Medicare HMO | Admitting: Psychology

## 2020-12-15 DIAGNOSIS — F320 Major depressive disorder, single episode, mild: Secondary | ICD-10-CM | POA: Diagnosis not present

## 2020-12-15 DIAGNOSIS — F411 Generalized anxiety disorder: Secondary | ICD-10-CM

## 2020-12-16 DIAGNOSIS — E782 Mixed hyperlipidemia: Secondary | ICD-10-CM | POA: Diagnosis not present

## 2020-12-16 DIAGNOSIS — E1122 Type 2 diabetes mellitus with diabetic chronic kidney disease: Secondary | ICD-10-CM | POA: Diagnosis not present

## 2020-12-16 DIAGNOSIS — R2689 Other abnormalities of gait and mobility: Secondary | ICD-10-CM | POA: Diagnosis not present

## 2020-12-16 DIAGNOSIS — I129 Hypertensive chronic kidney disease with stage 1 through stage 4 chronic kidney disease, or unspecified chronic kidney disease: Secondary | ICD-10-CM | POA: Diagnosis not present

## 2020-12-16 DIAGNOSIS — M532X3 Spinal instabilities, cervicothoracic region: Secondary | ICD-10-CM | POA: Diagnosis not present

## 2020-12-16 DIAGNOSIS — N1831 Chronic kidney disease, stage 3a: Secondary | ICD-10-CM | POA: Diagnosis not present

## 2020-12-16 DIAGNOSIS — M545 Low back pain, unspecified: Secondary | ICD-10-CM | POA: Diagnosis not present

## 2020-12-18 DIAGNOSIS — M545 Low back pain, unspecified: Secondary | ICD-10-CM | POA: Diagnosis not present

## 2020-12-18 DIAGNOSIS — R2689 Other abnormalities of gait and mobility: Secondary | ICD-10-CM | POA: Diagnosis not present

## 2020-12-18 DIAGNOSIS — M532X3 Spinal instabilities, cervicothoracic region: Secondary | ICD-10-CM | POA: Diagnosis not present

## 2020-12-25 ENCOUNTER — Telehealth: Payer: Self-pay | Admitting: Physical Medicine and Rehabilitation

## 2020-12-25 NOTE — Telephone Encounter (Signed)
Pt called and would like to schedule an injection with Dr.Newton. Same as before Left L5-S1 IL

## 2020-12-28 DIAGNOSIS — H02834 Dermatochalasis of left upper eyelid: Secondary | ICD-10-CM | POA: Diagnosis not present

## 2020-12-28 DIAGNOSIS — H53483 Generalized contraction of visual field, bilateral: Secondary | ICD-10-CM | POA: Diagnosis not present

## 2020-12-28 DIAGNOSIS — H02423 Myogenic ptosis of bilateral eyelids: Secondary | ICD-10-CM | POA: Diagnosis not present

## 2020-12-28 DIAGNOSIS — H02831 Dermatochalasis of right upper eyelid: Secondary | ICD-10-CM | POA: Diagnosis not present

## 2020-12-28 DIAGNOSIS — H02413 Mechanical ptosis of bilateral eyelids: Secondary | ICD-10-CM | POA: Diagnosis not present

## 2020-12-28 DIAGNOSIS — H0279 Other degenerative disorders of eyelid and periocular area: Secondary | ICD-10-CM | POA: Diagnosis not present

## 2020-12-28 DIAGNOSIS — H57813 Brow ptosis, bilateral: Secondary | ICD-10-CM | POA: Diagnosis not present

## 2020-12-28 NOTE — Telephone Encounter (Signed)
Patient was last seen for an OV in April because he would not give details over the phone about where his pain was located. Today he stated that his pain was where it always is. I asked where he always hurts and he said his hips but it hurts in his spine. After asking multiple times for more details he said "lets call it groin pain" and his pain is on the right. Please advise.

## 2020-12-29 ENCOUNTER — Ambulatory Visit: Payer: Medicare HMO | Admitting: Psychology

## 2020-12-29 ENCOUNTER — Telehealth: Payer: Self-pay | Admitting: Physical Medicine and Rehabilitation

## 2020-12-29 ENCOUNTER — Ambulatory Visit (INDEPENDENT_AMBULATORY_CARE_PROVIDER_SITE_OTHER): Payer: Medicare HMO | Admitting: Psychology

## 2020-12-29 DIAGNOSIS — F411 Generalized anxiety disorder: Secondary | ICD-10-CM

## 2020-12-29 NOTE — Telephone Encounter (Signed)
Left message #1 to schedule.

## 2020-12-29 NOTE — Telephone Encounter (Signed)
See previous message

## 2020-12-29 NOTE — Telephone Encounter (Signed)
Scheduled

## 2020-12-29 NOTE — Telephone Encounter (Signed)
Patient returned call asked for a call back. The number to contact patient is [phone]

## 2020-12-30 DIAGNOSIS — M532X3 Spinal instabilities, cervicothoracic region: Secondary | ICD-10-CM | POA: Diagnosis not present

## 2020-12-30 DIAGNOSIS — R2689 Other abnormalities of gait and mobility: Secondary | ICD-10-CM | POA: Diagnosis not present

## 2020-12-30 DIAGNOSIS — M545 Low back pain, unspecified: Secondary | ICD-10-CM | POA: Diagnosis not present

## 2021-01-05 DIAGNOSIS — H53483 Generalized contraction of visual field, bilateral: Secondary | ICD-10-CM | POA: Diagnosis not present

## 2021-01-06 DIAGNOSIS — M545 Low back pain, unspecified: Secondary | ICD-10-CM | POA: Diagnosis not present

## 2021-01-06 DIAGNOSIS — M532X3 Spinal instabilities, cervicothoracic region: Secondary | ICD-10-CM | POA: Diagnosis not present

## 2021-01-06 DIAGNOSIS — R2689 Other abnormalities of gait and mobility: Secondary | ICD-10-CM | POA: Diagnosis not present

## 2021-01-08 ENCOUNTER — Telehealth: Payer: Self-pay | Admitting: Physical Medicine and Rehabilitation

## 2021-01-08 DIAGNOSIS — R2689 Other abnormalities of gait and mobility: Secondary | ICD-10-CM | POA: Diagnosis not present

## 2021-01-08 DIAGNOSIS — M545 Low back pain, unspecified: Secondary | ICD-10-CM | POA: Diagnosis not present

## 2021-01-08 DIAGNOSIS — M532X3 Spinal instabilities, cervicothoracic region: Secondary | ICD-10-CM | POA: Diagnosis not present

## 2021-01-08 NOTE — Telephone Encounter (Signed)
Pt called stating his back has been feeling ok and he would like to cancel his 01/11/21 appt and will CB when needed.

## 2021-01-11 ENCOUNTER — Ambulatory Visit: Payer: Medicare HMO | Admitting: Physical Medicine and Rehabilitation

## 2021-01-12 ENCOUNTER — Ambulatory Visit (INDEPENDENT_AMBULATORY_CARE_PROVIDER_SITE_OTHER): Payer: Medicare HMO | Admitting: Psychology

## 2021-01-12 DIAGNOSIS — F320 Major depressive disorder, single episode, mild: Secondary | ICD-10-CM | POA: Diagnosis not present

## 2021-01-12 DIAGNOSIS — F411 Generalized anxiety disorder: Secondary | ICD-10-CM

## 2021-01-13 DIAGNOSIS — M532X3 Spinal instabilities, cervicothoracic region: Secondary | ICD-10-CM | POA: Diagnosis not present

## 2021-01-13 DIAGNOSIS — M545 Low back pain, unspecified: Secondary | ICD-10-CM | POA: Diagnosis not present

## 2021-01-13 DIAGNOSIS — R2689 Other abnormalities of gait and mobility: Secondary | ICD-10-CM | POA: Diagnosis not present

## 2021-01-21 DIAGNOSIS — M532X3 Spinal instabilities, cervicothoracic region: Secondary | ICD-10-CM | POA: Diagnosis not present

## 2021-01-21 DIAGNOSIS — R2689 Other abnormalities of gait and mobility: Secondary | ICD-10-CM | POA: Diagnosis not present

## 2021-01-21 DIAGNOSIS — M545 Low back pain, unspecified: Secondary | ICD-10-CM | POA: Diagnosis not present

## 2021-01-26 DIAGNOSIS — H02423 Myogenic ptosis of bilateral eyelids: Secondary | ICD-10-CM | POA: Diagnosis not present

## 2021-01-26 DIAGNOSIS — H53453 Other localized visual field defect, bilateral: Secondary | ICD-10-CM | POA: Diagnosis not present

## 2021-01-26 DIAGNOSIS — H02834 Dermatochalasis of left upper eyelid: Secondary | ICD-10-CM | POA: Diagnosis not present

## 2021-01-26 DIAGNOSIS — H02831 Dermatochalasis of right upper eyelid: Secondary | ICD-10-CM | POA: Diagnosis not present

## 2021-01-26 DIAGNOSIS — H02421 Myogenic ptosis of right eyelid: Secondary | ICD-10-CM | POA: Diagnosis not present

## 2021-01-26 DIAGNOSIS — H53483 Generalized contraction of visual field, bilateral: Secondary | ICD-10-CM | POA: Diagnosis not present

## 2021-01-26 DIAGNOSIS — H57813 Brow ptosis, bilateral: Secondary | ICD-10-CM | POA: Diagnosis not present

## 2021-02-15 DIAGNOSIS — N1831 Chronic kidney disease, stage 3a: Secondary | ICD-10-CM | POA: Diagnosis not present

## 2021-02-15 DIAGNOSIS — E782 Mixed hyperlipidemia: Secondary | ICD-10-CM | POA: Diagnosis not present

## 2021-02-15 DIAGNOSIS — I129 Hypertensive chronic kidney disease with stage 1 through stage 4 chronic kidney disease, or unspecified chronic kidney disease: Secondary | ICD-10-CM | POA: Diagnosis not present

## 2021-02-15 DIAGNOSIS — E1122 Type 2 diabetes mellitus with diabetic chronic kidney disease: Secondary | ICD-10-CM | POA: Diagnosis not present

## 2021-02-28 ENCOUNTER — Encounter (HOSPITAL_BASED_OUTPATIENT_CLINIC_OR_DEPARTMENT_OTHER): Payer: Self-pay | Admitting: Obstetrics and Gynecology

## 2021-02-28 ENCOUNTER — Emergency Department (HOSPITAL_BASED_OUTPATIENT_CLINIC_OR_DEPARTMENT_OTHER)
Admission: EM | Admit: 2021-02-28 | Discharge: 2021-02-28 | Disposition: A | Discharge status: Home / Self Care | Payer: Medicare HMO | Attending: Emergency Medicine | Admitting: Emergency Medicine

## 2021-02-28 ENCOUNTER — Other Ambulatory Visit: Payer: Self-pay

## 2021-02-28 DIAGNOSIS — Z7984 Long term (current) use of oral hypoglycemic drugs: Secondary | ICD-10-CM | POA: Insufficient documentation

## 2021-02-28 DIAGNOSIS — Z79899 Other long term (current) drug therapy: Secondary | ICD-10-CM | POA: Insufficient documentation

## 2021-02-28 DIAGNOSIS — Z7982 Long term (current) use of aspirin: Secondary | ICD-10-CM | POA: Diagnosis not present

## 2021-02-28 DIAGNOSIS — N1831 Chronic kidney disease, stage 3a: Secondary | ICD-10-CM | POA: Insufficient documentation

## 2021-02-28 DIAGNOSIS — I129 Hypertensive chronic kidney disease with stage 1 through stage 4 chronic kidney disease, or unspecified chronic kidney disease: Secondary | ICD-10-CM | POA: Insufficient documentation

## 2021-02-28 DIAGNOSIS — T18128A Food in esophagus causing other injury, initial encounter: Secondary | ICD-10-CM | POA: Diagnosis not present

## 2021-02-28 DIAGNOSIS — X58XXXA Exposure to other specified factors, initial encounter: Secondary | ICD-10-CM | POA: Insufficient documentation

## 2021-02-28 DIAGNOSIS — E1122 Type 2 diabetes mellitus with diabetic chronic kidney disease: Secondary | ICD-10-CM | POA: Insufficient documentation

## 2021-02-28 DIAGNOSIS — I2510 Atherosclerotic heart disease of native coronary artery without angina pectoris: Secondary | ICD-10-CM | POA: Diagnosis not present

## 2021-02-28 MED ORDER — GLUCAGON HCL RDNA (DIAGNOSTIC) 1 MG IJ SOLR
1.0000 mg | Freq: Once | INTRAMUSCULAR | Status: AC
  Administered 2021-02-28: 1 mg via INTRAVENOUS
  Filled 2021-02-28: qty 1

## 2021-02-28 NOTE — ED Notes (Signed)
Cup of Cola given.  Able to drink.  States feels better

## 2021-02-28 NOTE — ED Provider Notes (Signed)
[facility] DEPT Provider Note   CSN: [id number] Arrival date & time: 02/28/21  1107     History Chief Complaint  Patient presents with   Food Bolus    [name] is a 86 y.o. male.  Pt reports he ate a pork chop earlier yesterday.  Pt reports he has not been able to swallow saliva or drink water.  Pt reports he coughed some food up earlier.  Pt denies any shortness of breath.  No chest pain  The history is provided by the patient. No language interpreter was used.  Swallowed Foreign Body This is a new problem. The current episode started yesterday. The problem occurs constantly. The problem has been gradually worsening. Pertinent negatives include no chest pain, no abdominal pain and no shortness of breath. Nothing aggravates the symptoms. The symptoms are relieved by medications. He has tried nothing for the symptoms. The treatment provided no relief.      Past Medical History:  Diagnosis Date   Acute renal failure (HCC)    secondary to nephrotic syndrome post cath, resolved with steroids   CAD (coronary artery disease) January 2008   LIMA to LAD, sequential SVG to first, second, and third OM's, sequential SVG to mid RCA and PDA    Diabetes mellitus without complication ([place])    HTN (hypertension)    Hx of adenomatous colonic polyps    Hyperlipidemia     Patient Active Problem List   Diagnosis Date Noted   Unsteady gait 08/05/2020   Anxiety disorder 02/02/2018   Right hip pain 10/02/2017   Recurrent major depression in remission ([place]) 01/27/2017   Chronic kidney disease, stage 3a ([place]) 07/17/2015   Personal history of colonic adenomas 03/26/2013   Obesity 10/04/2012   OBSTRUCTIVE SLEEP APNEA 10/30/2008   HYPERLIPIDEMIA-MIXED 07/01/2008   HYPERTENSION, BENIGN 07/01/2008   CAD, NATIVE VESSEL 07/01/2008   FATIGUE / MALAISE 07/01/2008   EDEMA 07/01/2008    Past Surgical History:  Procedure Laterality Date   COLONOSCOPY W/ BIOPSIES  AND POLYPECTOMY     CORONARY ARTERY BYPASS GRAFT  2008   VESSELS X6   UMBILICAL HERNIA REPAIR         Family History  Problem Relation Age of Onset   Heart attack Mother    Heart disease Sister    Clotting disorder Father        died of renal disease   Colon cancer Neg Hx     Social History   Tobacco Use   Smoking status: Never   Smokeless tobacco: Never  Vaping Use   Vaping Use: Never used  Substance Use Topics   Alcohol use: No    Alcohol/week: 1.0 standard drink    Types: 1 Glasses of wine per week   Drug use: No    Home Medications Prior to Admission medications   Medication Sig Start Date End Date Taking? Authorizing Provider  aspirin EC 81 MG tablet Take 81 mg by mouth daily.    [provider]  atorvastatin (LIPITOR) 40 MG tablet Take 40 mg by mouth daily.    [provider]  carvedilol (COREG) 12.5 MG tablet Take 12.5 mg by mouth 2 (two) times daily with a meal.    [provider]  CIALIS 5 MG tablet Take 1 tablet by mouth every evening. 02/28/15   [provider]  furosemide (LASIX) 20 MG tablet Take one tablet by mouth 12/21/10   Bensimhon, Shaune Pascal, MD  JARDIANCE 25 MG TABS  tablet Take 1 tablet by mouth every evening. 02/28/15   [provider]  losartan (COZAAR) 100 MG tablet Take 100 mg by mouth daily.    [provider]  metFORMIN (GLUCOPHAGE) 1000 MG tablet 1 pill twice a day 12/28/19   [provider]  MYRBETRIQ 50 MG TB24 tablet  06/08/20   [provider]  RYBELSUS 7 MG TABS Take 1 tablet by mouth daily. 09/24/19   [provider]  tamsulosin (FLOMAX) 0.4 MG CAPS capsule Take 1 capsule by mouth daily. 09/18/19   [provider]  vitamin E 100 UNIT capsule Take 100 Units by mouth daily.    [provider]  ZETIA 10 MG tablet Take 1 tablet by mouth every morning. 03/04/15   [provider]    Allergies    Ketorolac tromethamine  Review of Systems    Review of Systems  Respiratory:  Negative for shortness of breath.   Cardiovascular:  Negative for chest pain.  Gastrointestinal:  Negative for abdominal pain.  All other systems reviewed and are negative.  Physical Exam Updated Vital Signs BP 138/90 (BP Location: Right Arm)   Pulse 81   Temp 98 F (36.7 C)   Resp 18   SpO2 94%   Physical Exam Vitals and nursing note reviewed.  Constitutional:      Appearance: He is well-developed.  HENT:     Head: Normocephalic and atraumatic.  Eyes:     Conjunctiva/sclera: Conjunctivae normal.  Cardiovascular:     Rate and Rhythm: Normal rate and regular rhythm.     Heart sounds: No murmur heard. Pulmonary:     Effort: Pulmonary effort is normal. No respiratory distress.     Breath sounds: Normal breath sounds.  Abdominal:     Palpations: Abdomen is soft.     Tenderness: There is no abdominal tenderness.  Musculoskeletal:     Cervical back: Neck supple.  Skin:    General: Skin is warm and dry.  Neurological:     Mental Status: He is alert.    ED Results / Procedures / Treatments   Labs (all labs ordered are listed, but only abnormal results are displayed) Labs Reviewed - No data to display  EKG None  Radiology No results found.  Procedures Procedures   Medications Ordered in ED Medications  glucagon (human recombinant) (GLUCAGEN) injection 1 mg (1 mg Intravenous Given 02/28/21 1412)    ED Course  I have reviewed the triage vital signs and the nursing notes.  Pertinent labs & imaging results that were available during my care of the patient were reviewed by me and considered in my medical decision making (see chart for details).    MDM Rules/Calculators/A&P                           MDM:  Pt given glucagon IV.  Pt aable to drink 8 ounces of coke.  Pt reports symptoms are completely resolved.   Pt counseled on soft diet.  Pt advised to follow up with primary care and his gi doctor Final Clinical Impression(s) /  ED Diagnoses Final diagnoses:  Food impaction of esophagus, initial encounter    Rx / DC Orders ED Discharge Orders     None     An After Visit Summary was printed and given to the patient.    Fransico Meadow, Hershal Coria 02/28/21 2203    Pattricia Boss, MD 03/02/21 [phone]

## 2021-02-28 NOTE — Discharge Instructions (Addendum)
Follow up with your Physician for recheck.  Follow up with your Gi doctor for recheck

## 2021-02-28 NOTE — ED Triage Notes (Signed)
Patient reports to the ER for a food bolus impaction. Patient reports something got stuck last night. Patient reports he cannot swallow his own spit and he has been unable to drink water since 8pm yesterday. Family reports he ate a pork chop and it has been stuck since.

## 2021-03-02 DIAGNOSIS — R2689 Other abnormalities of gait and mobility: Secondary | ICD-10-CM | POA: Diagnosis not present

## 2021-03-02 DIAGNOSIS — M532X3 Spinal instabilities, cervicothoracic region: Secondary | ICD-10-CM | POA: Diagnosis not present

## 2021-03-02 DIAGNOSIS — M545 Low back pain, unspecified: Secondary | ICD-10-CM | POA: Diagnosis not present

## 2021-03-03 DIAGNOSIS — R404 Transient alteration of awareness: Secondary | ICD-10-CM | POA: Diagnosis not present

## 2021-03-03 DIAGNOSIS — E119 Type 2 diabetes mellitus without complications: Secondary | ICD-10-CM | POA: Diagnosis not present

## 2021-03-03 DIAGNOSIS — I951 Orthostatic hypotension: Secondary | ICD-10-CM | POA: Diagnosis not present

## 2021-03-03 DIAGNOSIS — I10 Essential (primary) hypertension: Secondary | ICD-10-CM | POA: Diagnosis not present

## 2021-03-03 DIAGNOSIS — R410 Disorientation, unspecified: Secondary | ICD-10-CM | POA: Diagnosis not present

## 2021-03-03 DIAGNOSIS — N179 Acute kidney failure, unspecified: Secondary | ICD-10-CM | POA: Diagnosis not present

## 2021-03-03 DIAGNOSIS — I4891 Unspecified atrial fibrillation: Secondary | ICD-10-CM | POA: Diagnosis not present

## 2021-03-03 DIAGNOSIS — R079 Chest pain, unspecified: Secondary | ICD-10-CM | POA: Diagnosis not present

## 2021-03-03 DIAGNOSIS — I959 Hypotension, unspecified: Secondary | ICD-10-CM | POA: Diagnosis not present

## 2021-03-03 DIAGNOSIS — E860 Dehydration: Secondary | ICD-10-CM | POA: Diagnosis not present

## 2021-03-03 DIAGNOSIS — R0902 Hypoxemia: Secondary | ICD-10-CM | POA: Diagnosis not present

## 2021-03-03 DIAGNOSIS — Z951 Presence of aortocoronary bypass graft: Secondary | ICD-10-CM | POA: Diagnosis not present

## 2021-03-03 DIAGNOSIS — I878 Other specified disorders of veins: Secondary | ICD-10-CM | POA: Diagnosis not present

## 2021-03-03 DIAGNOSIS — I499 Cardiac arrhythmia, unspecified: Secondary | ICD-10-CM | POA: Diagnosis not present

## 2021-03-03 DIAGNOSIS — R55 Syncope and collapse: Secondary | ICD-10-CM | POA: Diagnosis not present

## 2021-03-03 DIAGNOSIS — R3915 Urgency of urination: Secondary | ICD-10-CM | POA: Diagnosis not present

## 2021-03-03 DIAGNOSIS — I25118 Atherosclerotic heart disease of native coronary artery with other forms of angina pectoris: Secondary | ICD-10-CM | POA: Diagnosis not present

## 2021-03-03 DIAGNOSIS — S37009D Unspecified injury of unspecified kidney, subsequent encounter: Secondary | ICD-10-CM | POA: Diagnosis not present

## 2021-03-03 DIAGNOSIS — I491 Atrial premature depolarization: Secondary | ICD-10-CM | POA: Diagnosis not present

## 2021-03-03 DIAGNOSIS — I2510 Atherosclerotic heart disease of native coronary artery without angina pectoris: Secondary | ICD-10-CM | POA: Diagnosis not present

## 2021-03-03 DIAGNOSIS — N401 Enlarged prostate with lower urinary tract symptoms: Secondary | ICD-10-CM | POA: Diagnosis not present

## 2021-03-03 DIAGNOSIS — I080 Rheumatic disorders of both mitral and aortic valves: Secondary | ICD-10-CM | POA: Diagnosis not present

## 2021-03-03 DIAGNOSIS — E785 Hyperlipidemia, unspecified: Secondary | ICD-10-CM | POA: Diagnosis not present

## 2021-03-03 DIAGNOSIS — E7800 Pure hypercholesterolemia, unspecified: Secondary | ICD-10-CM | POA: Diagnosis not present

## 2021-03-05 DIAGNOSIS — I491 Atrial premature depolarization: Secondary | ICD-10-CM | POA: Diagnosis not present

## 2021-03-08 DIAGNOSIS — M545 Low back pain, unspecified: Secondary | ICD-10-CM | POA: Diagnosis not present

## 2021-03-08 DIAGNOSIS — R2689 Other abnormalities of gait and mobility: Secondary | ICD-10-CM | POA: Diagnosis not present

## 2021-03-08 DIAGNOSIS — M532X3 Spinal instabilities, cervicothoracic region: Secondary | ICD-10-CM | POA: Diagnosis not present

## 2021-03-10 DIAGNOSIS — M545 Low back pain, unspecified: Secondary | ICD-10-CM | POA: Diagnosis not present

## 2021-03-10 DIAGNOSIS — R2689 Other abnormalities of gait and mobility: Secondary | ICD-10-CM | POA: Diagnosis not present

## 2021-03-10 DIAGNOSIS — M532X3 Spinal instabilities, cervicothoracic region: Secondary | ICD-10-CM | POA: Diagnosis not present

## 2021-03-17 DIAGNOSIS — E782 Mixed hyperlipidemia: Secondary | ICD-10-CM | POA: Diagnosis not present

## 2021-03-17 DIAGNOSIS — I10 Essential (primary) hypertension: Secondary | ICD-10-CM | POA: Diagnosis not present

## 2021-03-17 DIAGNOSIS — E1129 Type 2 diabetes mellitus with other diabetic kidney complication: Secondary | ICD-10-CM | POA: Diagnosis not present

## 2021-03-17 DIAGNOSIS — I25810 Atherosclerosis of coronary artery bypass graft(s) without angina pectoris: Secondary | ICD-10-CM | POA: Diagnosis not present

## 2021-03-18 ENCOUNTER — Telehealth: Payer: Self-pay

## 2021-03-18 NOTE — Telephone Encounter (Signed)
Left message for pt to call back to schedule an Office Visit.  Pt recently had an ED visit with a Food Impaction

## 2021-03-19 NOTE — Telephone Encounter (Signed)
Pt was scheduled for an office visit with Dr. Carlean Purl on 05/05/2021 @ 9:50. Pt was offered an earlier appointment with an APP but refused. Pt verbalized understanding with all questions answered.

## 2021-03-29 ENCOUNTER — Ambulatory Visit: Payer: Medicare HMO | Admitting: Physician Assistant

## 2021-04-15 DIAGNOSIS — I10 Essential (primary) hypertension: Secondary | ICD-10-CM | POA: Diagnosis not present

## 2021-04-15 DIAGNOSIS — E782 Mixed hyperlipidemia: Secondary | ICD-10-CM | POA: Diagnosis not present

## 2021-04-15 DIAGNOSIS — E1129 Type 2 diabetes mellitus with other diabetic kidney complication: Secondary | ICD-10-CM | POA: Diagnosis not present

## 2021-04-15 DIAGNOSIS — N401 Enlarged prostate with lower urinary tract symptoms: Secondary | ICD-10-CM | POA: Diagnosis not present

## 2021-04-15 DIAGNOSIS — I25810 Atherosclerosis of coronary artery bypass graft(s) without angina pectoris: Secondary | ICD-10-CM | POA: Diagnosis not present

## 2021-04-15 DIAGNOSIS — M25551 Pain in right hip: Secondary | ICD-10-CM | POA: Diagnosis not present

## 2021-04-15 DIAGNOSIS — G3184 Mild cognitive impairment, so stated: Secondary | ICD-10-CM | POA: Diagnosis not present

## 2021-04-15 DIAGNOSIS — Z23 Encounter for immunization: Secondary | ICD-10-CM | POA: Diagnosis not present

## 2021-04-15 DIAGNOSIS — N1831 Chronic kidney disease, stage 3a: Secondary | ICD-10-CM | POA: Diagnosis not present

## 2021-04-16 DIAGNOSIS — E782 Mixed hyperlipidemia: Secondary | ICD-10-CM | POA: Diagnosis not present

## 2021-04-16 DIAGNOSIS — I10 Essential (primary) hypertension: Secondary | ICD-10-CM | POA: Diagnosis not present

## 2021-04-16 DIAGNOSIS — I25810 Atherosclerosis of coronary artery bypass graft(s) without angina pectoris: Secondary | ICD-10-CM | POA: Diagnosis not present

## 2021-04-16 DIAGNOSIS — E1129 Type 2 diabetes mellitus with other diabetic kidney complication: Secondary | ICD-10-CM | POA: Diagnosis not present

## 2021-04-21 DIAGNOSIS — M5459 Other low back pain: Secondary | ICD-10-CM | POA: Diagnosis not present

## 2021-04-27 DIAGNOSIS — M5459 Other low back pain: Secondary | ICD-10-CM | POA: Diagnosis not present

## 2021-04-29 DIAGNOSIS — M5459 Other low back pain: Secondary | ICD-10-CM | POA: Diagnosis not present

## 2021-04-30 ENCOUNTER — Telehealth: Payer: Self-pay | Admitting: Physical Medicine and Rehabilitation

## 2021-04-30 NOTE — Telephone Encounter (Signed)
Patient called needing to schedule an appointment for his back.    Ph# [phone]

## 2021-05-04 NOTE — Telephone Encounter (Signed)
Pt sch'd 05/10/21 with Artis Delay

## 2021-05-05 ENCOUNTER — Ambulatory Visit: Payer: Medicare HMO | Admitting: Internal Medicine

## 2021-05-05 ENCOUNTER — Encounter: Payer: Self-pay | Admitting: Internal Medicine

## 2021-05-05 VITALS — BP 128/70 | HR 79 | Ht 69.0 in | Wt 183.0 lb

## 2021-05-05 DIAGNOSIS — R1319 Other dysphagia: Secondary | ICD-10-CM

## 2021-05-05 DIAGNOSIS — T18128A Food in esophagus causing other injury, initial encounter: Secondary | ICD-10-CM

## 2021-05-05 NOTE — Progress Notes (Signed)
[name] 87 y.o. [birth year] [id number]  Assessment & Plan:   Encounter Diagnoses  Name Primary?   Esophageal dysphagia Yes   Food impaction of esophagus, initial encounter    Patient will have an EGD with likely esophageal dilation tomorrow.  I am very suspicious of a ring or other occult stricture.  This does not sound like esophageal cancer given the pattern of signs and symptoms.  Dysmotility is in the differential as well.  The risks and benefits as well as alternatives of endoscopic procedure(s) have been discussed and reviewed. All questions answered. The patient agrees to proceed.  I appreciate the opportunity to care for this patient.  CC: Ginger Organ., MD   Subjective:   Chief Complaint: Dysphagia and history of food impaction  HPI 87 year old man seen in the emergency department on February 28, 2021 with a food impaction related to a pork chop.  He was treated medically with glucagon and his symptoms completely resolved.  No history of EGD or barium swallow.  He tells me he has a long-term issue with intermittent solid food dysphagia [place] or before big issues.  However this is infrequent.  If he gets in a hurry and he does not cut his food well he may have more problems.  There is been no associated heartburn or unintentional weight loss.  GI review of systems is otherwise negative.  Last colonoscopy was in 2014 with 5-6 small-diminutive adenomas.  Repeat colonoscopy was not pursued.   Allergies  Allergen Reactions   Ketorolac Tromethamine     Other reaction(s): nephrotic syndrome   Current Meds  Medication Sig   aspirin EC 81 MG tablet Take 81 mg by mouth daily.   atorvastatin (LIPITOR) 40 MG tablet Take 40 mg by mouth daily.   carvedilol (COREG) 12.5 MG tablet Take 12.5 mg by mouth 2 (two) times daily with a meal.   furosemide (LASIX) 20 MG tablet Take one tablet by mouth   JARDIANCE 25 MG TABS tablet Take 1 tablet by mouth every evening.    losartan (COZAAR) 100 MG tablet Take 100 mg by mouth daily.   metFORMIN (GLUCOPHAGE) 1000 MG tablet 1 pill twice a day   MYRBETRIQ 50 MG TB24 tablet    RYBELSUS 7 MG TABS Take 1 tablet by mouth daily.   tamsulosin (FLOMAX) 0.4 MG CAPS capsule Take 1 capsule by mouth daily.   vitamin E 100 UNIT capsule Take 100 Units by mouth daily.   ZETIA 10 MG tablet Take 1 tablet by mouth every morning.   Past Medical History:  Diagnosis Date   Acute renal failure ([place])    secondary to nephrotic syndrome post cath, resolved with steroids   Anxiety    CAD (coronary artery disease) 04/2006   LIMA to LAD, sequential SVG to first, second, and third OM's, sequential SVG to mid RCA and PDA    Depression    Diabetes mellitus without complication ([place])    ED (erectile dysfunction)    HTN (hypertension)    Hx of adenomatous colonic polyps    Hyperlipidemia    Obesity    Past Surgical History:  Procedure Laterality Date   COLONOSCOPY W/ BIOPSIES AND POLYPECTOMY     CORONARY ARTERY BYPASS GRAFT  2008   VESSELS X6   UMBILICAL HERNIA REPAIR     Social History   Social History Narrative   Retired Chief Strategy Officer.  Widowed since 2014.  He has multiple children.  Never smoker 5 caffeinated beverages  daily and no alcohol   family history includes Clotting disorder in his father; Heart attack in his mother; Heart disease in his sister.   Review of Systems As per HPI, mild anxiety, back pain, insomnia, pedal edema otherwise negative  Objective:   Physical Exam BP 128/70    Pulse 79    Ht 5\' 9"  (1.753 m)    Wt 183 lb (83 kg)    BMI 27.02 kg/m  WDWN NAD elderly wm Mouth - partial dentures, clear Lungs cta Cor NL S1S2 no rmg Abd soft and NT BS + Alert and oriented x 3

## 2021-05-05 NOTE — Patient Instructions (Signed)
You have been scheduled for an endoscopy. Please follow written instructions given to you at your visit today. If you use inhalers (even only as needed), please bring them with you on the day of your procedure.  If you are age 85 or older, your body mass index should be between 23-30. Your Body mass index is 27.02 kg/m. If this is out of the aforementioned range listed, please consider follow up with your Primary Care Provider.  If you are age 24 or younger, your body mass index should be between 19-25. Your Body mass index is 27.02 kg/m. If this is out of the aformentioned range listed, please consider follow up with your Primary Care Provider.   ________________________________________________________  The [facility] providers would like to encourage you to use [facility] to communicate with providers for non-urgent requests or questions.  Due to long hold times on the telephone, sending your provider a message by [facility] may be a faster and more efficient way to get a response.  Please allow 48 business hours for a response.  Please remember that this is for non-urgent requests.  _______________________________________________________   I appreciate the opportunity to care for you. Silvano Rusk, MD, [facility]

## 2021-05-06 ENCOUNTER — Other Ambulatory Visit: Payer: Self-pay

## 2021-05-06 ENCOUNTER — Encounter: Payer: Self-pay | Admitting: Internal Medicine

## 2021-05-06 ENCOUNTER — Ambulatory Visit (AMBULATORY_SURGERY_CENTER): Payer: Medicare HMO | Admitting: Internal Medicine

## 2021-05-06 VITALS — BP 125/75 | HR 68 | Temp 97.3°F | Resp 19 | Ht 69.0 in | Wt 183.0 lb

## 2021-05-06 DIAGNOSIS — K222 Esophageal obstruction: Secondary | ICD-10-CM | POA: Diagnosis not present

## 2021-05-06 DIAGNOSIS — R1319 Other dysphagia: Secondary | ICD-10-CM

## 2021-05-06 DIAGNOSIS — K2210 Ulcer of esophagus without bleeding: Secondary | ICD-10-CM | POA: Diagnosis not present

## 2021-05-06 DIAGNOSIS — K2270 Barrett's esophagus without dysplasia: Secondary | ICD-10-CM

## 2021-05-06 DIAGNOSIS — I2510 Atherosclerotic heart disease of native coronary artery without angina pectoris: Secondary | ICD-10-CM | POA: Diagnosis not present

## 2021-05-06 DIAGNOSIS — K259 Gastric ulcer, unspecified as acute or chronic, without hemorrhage or perforation: Secondary | ICD-10-CM

## 2021-05-06 DIAGNOSIS — E669 Obesity, unspecified: Secondary | ICD-10-CM | POA: Diagnosis not present

## 2021-05-06 DIAGNOSIS — E119 Type 2 diabetes mellitus without complications: Secondary | ICD-10-CM | POA: Diagnosis not present

## 2021-05-06 DIAGNOSIS — R1310 Dysphagia, unspecified: Secondary | ICD-10-CM

## 2021-05-06 DIAGNOSIS — K2901 Acute gastritis with bleeding: Secondary | ICD-10-CM

## 2021-05-06 DIAGNOSIS — K3189 Other diseases of stomach and duodenum: Secondary | ICD-10-CM | POA: Diagnosis not present

## 2021-05-06 DIAGNOSIS — I10 Essential (primary) hypertension: Secondary | ICD-10-CM | POA: Diagnosis not present

## 2021-05-06 DIAGNOSIS — K2281 Esophageal polyp: Secondary | ICD-10-CM | POA: Diagnosis not present

## 2021-05-06 MED ORDER — OMEPRAZOLE 40 MG PO CPDR: 40.0000 mg | DELAYED_RELEASE_CAPSULE | Freq: Every day | ORAL | 3 refills | Status: DC

## 2021-05-06 MED ORDER — SODIUM CHLORIDE 0.9 % IV SOLN: 500.0000 mL | Freq: Once | INTRAVENOUS | Status: DC

## 2021-05-06 NOTE — Progress Notes (Signed)
Pt's states no medical or surgical changes since previsit or office visit. 

## 2021-05-06 NOTE — Progress Notes (Signed)
Report to PACU, RN, vss, BBS= Clear.  

## 2021-05-06 NOTE — Progress Notes (Signed)
C.W. vital signs. 

## 2021-05-06 NOTE — Progress Notes (Signed)
History and Physical Interval Note:  05/06/2021 9:58 AM  [name]  has presented today for endoscopic procedure(s), with the diagnosis of  Encounter Diagnosis  Name Primary?   Esophageal dysphagia Yes  .  The various methods of evaluation and treatment have been discussed with the patient and/or family. After consideration of risks, benefits and other options for treatment, the patient has consented to  the endoscopic procedure(s).   The patient's history has been reviewed, patient examined, no change in status, stable for endoscopic procedure(s).  I have reviewed the patient's chart and labs.  Questions were answered to the patient's satisfaction.     Gatha Mayer, MD, [facility]

## 2021-05-06 NOTE — Op Note (Signed)
[facility] Patient Name: [name] Procedure Date: 05/06/2021 9:46 AM MRN: [id number] Endoscopist: Gatha Mayer , MD Age: 87 Referring MD:  Date of Birth: 03/12/1935 Gender: Male Account #: [id number] Procedure:                Upper GI endoscopy Indications:              Dysphagia Medicines:                Propofol per Anesthesia, Monitored Anesthesia Care Procedure:                Pre-Anesthesia Assessment:                           - Prior to the procedure, a History and Physical                            was performed, and patient medications and                            allergies were reviewed. The patient's tolerance of                            previous anesthesia was also reviewed. The risks                            and benefits of the procedure and the sedation                            options and risks were discussed with the patient.                            All questions were answered, and informed consent                            was obtained. Prior Anticoagulants: The patient has                            taken no previous anticoagulant or antiplatelet                            agents. ASA Grade Assessment: II - A patient with                            mild systemic disease. After reviewing the risks                            and benefits, the patient was deemed in                            satisfactory condition to undergo the procedure.                           After obtaining informed consent, the endoscope was  passed under direct vision. Throughout the                            procedure, the patient's blood pressure, pulse, and                            oxygen saturations were monitored continuously. The                            GIF [id number] was introduced through the                            mouth, and advanced to the second part of duodenum.                            The upper GI endoscopy  was accomplished without                            difficulty. The patient tolerated the procedure                            well. Scope In: Scope Out: Findings:                 One esophageal ulcer with no stigmata of recent                            bleeding was found at the gastroesophageal                            junction. The lesion was 4 mm in largest dimension.                            Biopsies were taken with a cold forceps for                            histology. Verification of patient identification                            for the specimen was done. Estimated blood loss was                            minimal.                           One benign-appearing, intrinsic mild stenosis was                            found at the gastroesophageal junction. The                            stenosis was traversed. A TTS dilator was passed                            through the scope. Dilation with  an 18-19-20 mm                            balloon dilator was performed to 20 mm. The                            dilation site was examined and showed no change.                            Estimated blood loss: none.                           Few non-bleeding cratered gastric ulcers with no                            stigmata of bleeding were found in the prepyloric                            region of the stomach. The largest lesion was 7 mm                            in largest dimension. Biopsies were taken with a                            cold forceps for histology. Verification of patient                            identification for the specimen was done. Estimated                            blood loss was minimal.                           Multiple dispersed small erosions with stigmata of                            recent bleeding were found in the gastric body.                            Biopsies were taken with a cold forceps for                            histology.  Verification of patient identification                            for the specimen was done. Estimated blood loss was                            minimal.                           The cardia and gastric fundus were normal on  retroflexion.                           The gastroesophageal flap valve was visualized                            endoscopically and classified as Hill Grade I                            (prominent fold, tight to endoscope).                           The examined duodenum was normal. Complications:            No immediate complications. Estimated Blood Loss:     Estimated blood loss was minimal. Impression:               - Esophageal ulcer with no stigmata of recent                            bleeding. Biopsied.                           - Benign-appearing esophageal stenosis. Dilated.                           - Non-bleeding gastric ulcers with no stigmata of                            bleeding. Biopsied.                           - Erosive gastropathy with stigmata of recent                            bleeding. Biopsied.                           - Gastroesophageal flap valve classified as Hill                            Grade I (prominent fold, tight to endoscope).                           - Normal examined duodenum. Recommendation:           - Patient has a contact number available for                            emergencies. The signs and symptoms of potential                            delayed complications were discussed with the                            patient. Return to normal activities tomorrow.  Written discharge instructions were provided to the                            patient.                           - Clear liquids x 1 hour then soft foods rest of                            day. Start prior diet tomorrow.                           - Continue present medications.                           -  Await pathology results.                           - Start omeprazole 40 mg qd Rx sent                           Suspect ASA may be cause of ulcers but checking for                            H pylori, needs ASA w/ CAD Hx                           No clear stricture but ? edema from GERD causing                            dysphagia Gatha Mayer, MD 05/06/2021 10:25:01 AM This report has been signed electronically.

## 2021-05-06 NOTE — Patient Instructions (Addendum)
There were multiple ulcers and inflamed areas in the stomach. May be from aspirin. Took biopsies.  There is inflammation where the esophagus and stomach mneet - probably from acid reflux. Biopsied also and dilated this area.  I will let you know pathology results and next steps.  At this point I do think it makes sense to start an acid-reducing medication and have prescribed   I appreciate the opportunity to care for you. Gatha Mayer, MD, FACG YOU HAD AN ENDOSCOPIC PROCEDURE TODAY AT [facility]:   Refer to the procedure report that was given to you for any specific questions about what was found during the examination.  If the procedure report does not answer your questions, please call your gastroenterologist to clarify.  If you requested that your care partner not be given the details of your procedure findings, then the procedure report has been included in a sealed envelope for you to review at your convenience later.  YOU SHOULD EXPECT: Some feelings of bloating in the abdomen. Passage of more gas than usual.  Walking can help get rid of the air that was put into your GI tract during the procedure and reduce the bloating. If you had a lower endoscopy (such as a colonoscopy or flexible sigmoidoscopy) you may notice spotting of blood in your stool or on the toilet paper. If you underwent a bowel prep for your procedure, you may not have a normal bowel movement for a few days.  Please Note:  You might notice some irritation and congestion in your nose or some drainage.  This is from the oxygen used during your procedure.  There is no need for concern and it should clear up in a day or so.  SYMPTOMS TO REPORT IMMEDIATELY:  Following upper endoscopy (EGD)  Vomiting of blood or coffee ground material  New chest pain or pain under the shoulder blades  Painful or persistently difficult swallowing  New shortness of breath  Fever of 100F or higher  Black, tarry-looking  stools  For urgent or emergent issues, a gastroenterologist can be reached at any hour by calling [phone]. Do not use MyChart messaging for urgent concerns.    DIET:  We do recommend a small meal at first, but then you may proceed to your regular diet.  Drink plenty of fluids but you should avoid alcoholic beverages for 24 hours.  ACTIVITY:  You should plan to take it easy for the rest of today and you should NOT DRIVE or use heavy machinery until tomorrow (because of the sedation medicines used during the test).    FOLLOW UP: Our staff will call the number listed on your records 48-72 hours following your procedure to check on you and address any questions or concerns that you may have regarding the information given to you following your procedure. If we do not reach you, we will leave a message.  We will attempt to reach you two times.  During this call, we will ask if you have developed any symptoms of COVID 19. If you develop any symptoms (ie: fever, flu-like symptoms, shortness of breath, cough etc.) before then, please call [phone].  If you test positive for Covid 19 in the 2 weeks post procedure, please call and report this information to [place].    If any biopsies were taken you will be contacted by phone or by letter within the next 1-3 weeks.  Please call us at [phone] if you have not heard  about the biopsies in 3 weeks.    SIGNATURES/CONFIDENTIALITY: You and/or your care partner have signed paperwork which will be entered into your electronic medical record.  These signatures attest to the fact that that the information above on your After Visit Summary has been reviewed and is understood.  Full responsibility of the confidentiality of this discharge information lies with you and/or your care-partner.

## 2021-05-06 NOTE — Progress Notes (Signed)
Called to room to assist during endoscopic procedure.  Patient ID and intended procedure confirmed with present staff. Received instructions for my participation in the procedure from the performing physician.  

## 2021-05-10 ENCOUNTER — Ambulatory Visit (INDEPENDENT_AMBULATORY_CARE_PROVIDER_SITE_OTHER): Payer: Medicare HMO

## 2021-05-10 ENCOUNTER — Telehealth: Payer: Self-pay

## 2021-05-10 ENCOUNTER — Ambulatory Visit (INDEPENDENT_AMBULATORY_CARE_PROVIDER_SITE_OTHER): Payer: Medicare HMO | Admitting: Physician Assistant

## 2021-05-10 ENCOUNTER — Other Ambulatory Visit: Payer: Self-pay

## 2021-05-10 ENCOUNTER — Encounter: Payer: Self-pay | Admitting: Physician Assistant

## 2021-05-10 DIAGNOSIS — M542 Cervicalgia: Secondary | ICD-10-CM

## 2021-05-10 DIAGNOSIS — M25551 Pain in right hip: Secondary | ICD-10-CM

## 2021-05-10 NOTE — Addendum Note (Signed)
Addended by: Robyne Peers on: 05/10/2021 02:16 PM   Modules accepted: Orders

## 2021-05-10 NOTE — Telephone Encounter (Signed)
°  Follow up Call-  Call back number 05/06/2021  Post procedure Call Back phone  # [phone]  Permission to leave phone message Yes  Some recent data might be hidden     Patient questions:  Do you have a fever, pain , or abdominal swelling? No. Pain Score  0 *  Have you tolerated food without any problems? Yes.    Have you been able to return to your normal activities? Yes.    Do you have any questions about your discharge instructions: Diet   No. Medications  No. Follow up visit  No.  Do you have questions or concerns about your Care? No.  Actions: * If pain score is 4 or above: No action needed, pain <4.  Have you developed a fever since your procedure? no  2.   Have you had an respiratory symptoms (SOB or cough) since your procedure? no  3.   Have you tested positive for COVID 19 since your procedure no  4.   Have you had any family members/close contacts diagnosed with the COVID 19 since your procedure?  no   If yes to any of these questions please route to Joylene John, RN and Joella Prince, RN

## 2021-05-10 NOTE — Progress Notes (Signed)
Office Visit Note   Patient: [name]           Date of Birth: 04-Nov-1934           MRN: [id number] Visit Date: 05/10/2021              Requested by: [name]., MD [address] [place],  [place] [address] PCP: [name]., MD   Assessment & Plan: Visit Diagnoses:  1. Neck pain   2. Pain in right hip     Plan: Recommend therapy for range of motion strengthening modalities to the cervical spine.  He is given a prescription as he knows the therapist would like to work with.  In regards to the right hip recommend a repeat intra-articular injection right hip and having follow-up with [name] 4 weeks after the injection to reevaluate his right hip pain.  Questions were encouraged and answered  Follow-Up Instructions: Return 4 weeks after injection.   Orders:  Orders Placed This Encounter  Procedures   XR HIP UNILAT W OR W/O PELVIS 2-3 VIEWS RIGHT   XR Cervical Spine 2 or 3 views   No orders of the defined types were placed in this encounter.     Procedures: No procedures performed   Clinical Data: No additional findings.   Subjective: Chief Complaint  Patient presents with   Neck - Pain   Right Hip - Pain    HPI [name] returns today due to right hip pain and neck discomfort.  He was last seen in our office on 10/23/2019.  At that point in time he was having some low back pain and undergone a lumbar epidural steroid injection with Dr. Ernestina Patches for spinal stenosis.  He comes in today having mostly right groin pain.  States he has stiffness whenever he first begins to ambulate.  He is having no radicular symptoms down either leg.  He has had prior intra-articular injection of the right hip back in 2019 which she did well with.  Regards to his neck no pain.  No radicular symptoms down either arm.  This popping in the neck with range of motion.  This been ongoing for the past month.  Review of Systems See HPI otherwise negative or  noncontributory  Objective: Vital Signs: There were no vitals taken for this visit.  Physical Exam General well-developed well-nourished male no acute distress Ortho Exam Cervical spine full range of motion cervical spine with flexion extension rotation left right.  Rotation of the left causes some discomfort. Bilateral hips he has full external rotation bilateral hips.  Slightly limited internal rotation right hip only.  Tenderness over the right hip trochanteric region.  Ambulates without any assistive device. Specialty Comments:  No specialty comments available.  Imaging: XR HIP UNILAT W OR W/O PELVIS 2-3 VIEWS RIGHT  Result Date: 05/10/2021 Bilateral hips: AP pelvis lateral view of the right hip shows mild narrowing bilateral hip joints.  No acute fractures.  No evidence of AVN.  Hips are well located.  XR Cervical Spine 2 or 3 views  Result Date: 05/10/2021 Cervical spine 2 views: No acute fractures.  No spondylolisthesis.  C5-C7 degenerative changes with anterior osteophytes and loss of disc space at C6-C7.  Normal lordotic curvature.    PMFS History: Patient Active Problem List   Diagnosis Date Noted   Unsteady gait 08/05/2020   Anxiety disorder 02/02/2018   Right hip pain 10/02/2017   Recurrent major depression in remission ([place])  01/27/2017   Chronic kidney disease, stage 3a ([place]) 07/17/2015   Personal history of colonic adenomas 03/26/2013   Obesity 10/04/2012   OBSTRUCTIVE SLEEP APNEA 10/30/2008   HYPERLIPIDEMIA-MIXED 07/01/2008   HYPERTENSION, BENIGN 07/01/2008   CAD, NATIVE VESSEL 07/01/2008   FATIGUE / MALAISE 07/01/2008   EDEMA 07/01/2008   Past Medical History:  Diagnosis Date   Acute renal failure (HCC)    secondary to nephrotic syndrome post cath, resolved with steroids   Anxiety    CAD (coronary artery disease) 04/2006   LIMA to LAD, sequential SVG to first, second, and third OM's, sequential SVG to mid RCA and PDA    Depression    Diabetes mellitus  without complication ([place])    ED (erectile dysfunction)    HTN (hypertension)    Hx of adenomatous colonic polyps    Hyperlipidemia    Obesity     Family History  Problem Relation Age of Onset   Heart attack Mother    Heart disease Sister    Clotting disorder Father        died of renal disease   Colon cancer Neg Hx     Past Surgical History:  Procedure Laterality Date   COLONOSCOPY W/ BIOPSIES AND POLYPECTOMY     CORONARY ARTERY BYPASS GRAFT  2008   VESSELS X6   UMBILICAL HERNIA REPAIR     Social History   Occupational History   Occupation: Surveyor, mining  Tobacco Use   Smoking status: Never   Smokeless tobacco: Never  Vaping Use   Vaping Use: Never used  Substance and Sexual Activity   Alcohol use: No    Alcohol/week: 1.0 standard drink    Types: 1 Glasses of wine per week   Drug use: No   Sexual activity: Not on file

## 2021-05-11 DIAGNOSIS — M5459 Other low back pain: Secondary | ICD-10-CM | POA: Diagnosis not present

## 2021-05-20 ENCOUNTER — Ambulatory Visit (INDEPENDENT_AMBULATORY_CARE_PROVIDER_SITE_OTHER): Payer: Medicare HMO | Admitting: Physical Medicine and Rehabilitation

## 2021-05-20 ENCOUNTER — Ambulatory Visit: Payer: Self-pay

## 2021-05-20 ENCOUNTER — Encounter: Payer: Self-pay | Admitting: Physical Medicine and Rehabilitation

## 2021-05-20 ENCOUNTER — Other Ambulatory Visit: Payer: Self-pay

## 2021-05-20 DIAGNOSIS — M25551 Pain in right hip: Secondary | ICD-10-CM

## 2021-05-20 MED ORDER — BUPIVACAINE HCL 0.25 % IJ SOLN
4.0000 mL | INTRAMUSCULAR | Status: AC | PRN
  Administered 2021-05-20: 4 mL via INTRA_ARTICULAR

## 2021-05-20 MED ORDER — TRIAMCINOLONE ACETONIDE 40 MG/ML IJ SUSP
60.0000 mg | INTRAMUSCULAR | Status: AC | PRN
  Administered 2021-05-20: 60 mg via INTRA_ARTICULAR

## 2021-05-20 NOTE — Progress Notes (Signed)
[name] - 87 y.o. male MRN [id number]  Date of birth: 07-15-1934  Office Visit Note: Visit Date: 05/20/2021 PCP: Ginger Organ., MD Referred by: Pete Pelt, PA-C  Subjective: Chief Complaint  Patient presents with   Right Hip - Pain   HPI:  [name] is a 87 y.o. male who comes in today at the request of Benita Stabile, PA-C for planned Right anesthetic hip arthrogram with fluoroscopic guidance.  The patient has failed conservative care including home exercise, medications, time and activity modification.  This injection will be diagnostic and hopefully therapeutic.  Please see requesting physician notes for further details and justification.   ROS Otherwise per HPI.  Assessment & Plan: Visit Diagnoses:    ICD-10-CM   1. Pain in right hip  M25.551 XR C-ARM NO REPORT    Large Joint Inj: R hip joint      Plan: No additional findings.   Meds & Orders: No orders of the defined types were placed in this encounter.   Orders Placed This Encounter  Procedures   Large Joint Inj: R hip joint   XR C-ARM NO REPORT    Follow-up: Return for visit to requesting provider as needed.   Procedures: Large Joint Inj: R hip joint on 05/20/2021 9:08 AM Indications: diagnostic evaluation and pain Details: 22 G 3.5 in needle, fluoroscopy-guided anterior approach  Arthrogram: No  Medications: 4 mL bupivacaine 0.25 %; 60 mg triamcinolone acetonide 40 MG/ML Outcome: tolerated well, no immediate complications  There was excellent flow of contrast producing a partial arthrogram of the hip. The patient did have relief of symptoms during the anesthetic phase of the injection. Procedure, treatment alternatives, risks and benefits explained, specific risks discussed. Consent was given by the patient. Immediately prior to procedure a time out was called to verify the correct patient, procedure, equipment, support staff and site/side marked as required. Patient was prepped and draped  in the usual sterile fashion.         Clinical History: MRI LUMBAR SPINE WITHOUT CONTRAST   TECHNIQUE: Multiplanar, multisequence MR imaging of the lumbar spine was performed. No intravenous contrast was administered.   COMPARISON:  Lumbar MRI 12/28/2017   FINDINGS: Segmentation:  Normal   Alignment: 4 mm anterolisthesis L4-5 unchanged. Remaining alignment normal.   Vertebrae:  Negative for fracture or mass.  No bone marrow edema.   Conus medullaris and cauda equina: Conus extends to the L1 level. Conus and cauda equina appear normal.   Paraspinal and other soft tissues: Negative for paraspinous mass or adenopathy. No it soft tissue edema or fluid collection.   Disc levels:   T12-L1: Negative   L1-2: Mild disc and mild facet degeneration. Negative for disc protrusion or stenosis.   L2-3: Diffuse disc bulging and endplate spurring. Bilateral facet hypertrophy. Mild spinal stenosis and moderate subarticular stenosis bilaterally.   L3-4: Disc degeneration with diffuse disc bulging and endplate spurring. Bilateral facet hypertrophy. Mild spinal stenosis and moderate subarticular stenosis bilaterally due to spurring. . Interval resolution of small synovial cyst on the left.   L4-5: 4 mm anterolisthesis with advanced facet degeneration. Disc degeneration with diffuse disc bulging. Moderate spinal stenosis. Moderate to severe subarticular stenosis bilaterally. No change since the prior MRI.   L5-S1: Disc degeneration with diffuse endplate spurring. Mild facet degeneration. Moderate subarticular and foraminal stenosis on the right. Mild subarticular stenosis on the left.   IMPRESSION: Multilevel degenerative change throughout the lumbar spine similar to the prior  MRI. There is resolution of the left-sided small synovial cyst at L3-4. No new or acute findings.     Electronically Signed   By: Franchot Gallo M.D.   On: 10/13/2019 09:37     Objective:  VS:  HT:      WT:    BMI:      BP:    HR: bpm   TEMP: ( )   RESP:  Physical Exam   Imaging: No results found.

## 2021-05-20 NOTE — Progress Notes (Signed)
Pt state right hip pain. Pt state any movement makes the pain worse. Pt state he takes over the counter pain meds to help ease his pain.  Numeric Pain Rating Scale and Functional Assessment Average Pain 3   In the last MONTH (on 0-10 scale) has pain interfered with the following?  1. General activity like being  able to carry out your everyday physical activities such as walking, climbing stairs, carrying groceries, or moving a chair?  Rating(10)   -BT, -Dye Allergies.

## 2021-05-25 DIAGNOSIS — M5459 Other low back pain: Secondary | ICD-10-CM | POA: Diagnosis not present

## 2021-05-27 ENCOUNTER — Other Ambulatory Visit: Payer: Self-pay

## 2021-05-27 ENCOUNTER — Ambulatory Visit: Payer: Medicare HMO | Admitting: Cardiology

## 2021-05-27 ENCOUNTER — Encounter: Payer: Self-pay | Admitting: Cardiology

## 2021-05-27 VITALS — BP 177/82 | HR 56 | Temp 97.9°F | Ht 69.0 in | Wt 178.0 lb

## 2021-05-27 DIAGNOSIS — I10 Essential (primary) hypertension: Secondary | ICD-10-CM | POA: Diagnosis not present

## 2021-05-27 DIAGNOSIS — I951 Orthostatic hypotension: Secondary | ICD-10-CM

## 2021-05-27 DIAGNOSIS — I2510 Atherosclerotic heart disease of native coronary artery without angina pectoris: Secondary | ICD-10-CM | POA: Diagnosis not present

## 2021-05-27 DIAGNOSIS — E7800 Pure hypercholesterolemia, unspecified: Secondary | ICD-10-CM

## 2021-05-27 DIAGNOSIS — R55 Syncope and collapse: Secondary | ICD-10-CM | POA: Diagnosis not present

## 2021-05-27 MED ORDER — ACEBUTOLOL HCL 200 MG PO CAPS: 200.0000 mg | ORAL_CAPSULE | Freq: Two times a day (BID) | ORAL | 2 refills | Status: DC

## 2021-05-27 NOTE — Progress Notes (Signed)
Primary Physician/Referring:  Ginger Organ., MD  Patient ID: [name], male    DOB: [birth year], 87 y.o.   MRN: [id number]  Chief Complaint  Patient presents with   Loss of Consciousness   New Patient (Initial Visit)   HPI:    [name]  is a 87 y.o. male with history of CAD s/p CABG  in 2008 with LIMA to LAD, sequential saphenous vein graft to first second and third obtuse marginals, and sequential saphenous vein graft to the RCA and right PDA. He has history of hypertension, hyperlipidemia, diabetes mellitus, GERD and esophageal and gastric ulcers diagnosed in Jan 2023 for dysphagia.  Patient presents to our office to establish cardiac care and for evaluation of syncope.  Patient was hospitalized at [facility] 03/03/2021 - 03/04/2021 following a syncopal episode.  He underwent echocardiogram and carotid artery duplex, details below.  Cardiac monitoring during admission revealed no significant cardiac arrhythmias, but did show PACs.  Patient has had a total of 5 episodes of syncope in the past 5 years, in the last 6 months he has had 2 episodes.  All episodes have occurred while he is standing up.  He did have some premonitory symptoms.  He remains active, except for degenerative joint disease no other specific symptoms, denies chest pain or dyspnea.  Past Medical History:  Diagnosis Date   Acute renal failure (HCC)    secondary to nephrotic syndrome post cath, resolved with steroids   Anxiety    CAD (coronary artery disease) 04/2006   LIMA to LAD, sequential SVG to first, second, and third OM's, sequential SVG to mid RCA and PDA    Depression    Diabetes mellitus without complication ([place])    ED (erectile dysfunction)    HTN (hypertension)    Hx of adenomatous colonic polyps    Hyperlipidemia    Obesity    Past Surgical History:  Procedure Laterality Date   COLONOSCOPY W/ BIOPSIES AND POLYPECTOMY     CORONARY ARTERY BYPASS GRAFT  2008   VESSELS X6    UMBILICAL HERNIA REPAIR     Family History  Problem Relation Age of Onset   Heart attack Mother    Heart disease Sister    Clotting disorder Father        died of renal disease   Colon cancer Neg Hx     Social History   Tobacco Use   Smoking status: Never   Smokeless tobacco: Never  Substance Use Topics   Alcohol use: No    Alcohol/week: 1.0 standard drink    Types: 1 Glasses of wine per week   Marital Status: Widowed   ROS  Review of Systems  Cardiovascular:  Positive for syncope. Negative for chest pain, dyspnea on exertion and leg swelling.  Gastrointestinal:  Positive for constipation.   Objective  Blood pressure (!) 177/82, pulse (!) 56, temperature 97.9 F (36.6 C), temperature source Temporal, height 5\' 9"  (1.753 m), weight 178 lb (80.7 kg), SpO2 97 %.  Vitals with BMI 05/27/2021 05/06/2021 05/06/2021  Height 5\' 9"  - -  Weight 178 lbs - -  BMI 17.61 - -  Systolic 607 371 062  Diastolic 82 75 60  Pulse 56 68 70    Orthostatic VS for the past 72 hrs (Last 3 readings):  Orthostatic BP Patient Position BP Location Cuff Size Orthostatic Pulse  05/27/21 1248 162/82 Standing Left Arm Normal 80  05/27/21 1247 166/81 Sitting Left Arm Normal 85  05/27/21 1246 (!) 183/92 Supine Left Arm Normal 63    Physical Exam Neck:     Vascular: No carotid bruit or JVD.  Cardiovascular:     Rate and Rhythm: Normal rate and regular rhythm. Occasional Extrasystoles are present.    Pulses: Intact distal pulses.     Heart sounds: Normal heart sounds. No murmur heard.   No gallop.  Pulmonary:     Effort: Pulmonary effort is normal.     Breath sounds: Normal breath sounds.  Abdominal:     General: Bowel sounds are normal.     Palpations: Abdomen is soft.  Musculoskeletal:     Right lower leg: Edema (2+ ankle edema) present.     Left lower leg: Edema (2+ ankle edema) present.   Laboratory examination:   External labs:  03/03/2021: Magnesium 2.0 Sodium 137, potassium 4.3, BUN  29, creatinine 1.45, GFR 48 CBC Hgb 14.9, HCT 44, MCV 97, platelet 201 TSH 1.66 High-sensitivity troponin 6 A1c 7.1%  10/06/2020:  Total cholesterol 129, triglycerides 92, HDL 53, LDL 58.  Non-HDL cholesterol 76.  Apolipoprotein B 75, normal.  A1c 7.2%.  TSH normal at 0.97.  Allergies   Allergies  Allergen Reactions   Ketorolac Tromethamine Other (See Comments)    nephrotic syndrome     Final Medications at End of Visit     Current Outpatient Medications:    acebutolol (SECTRAL) 200 MG capsule, Take 1 capsule (200 mg total) by mouth 2 (two) times daily., Disp: 60 capsule, Rfl: 2   aspirin EC 81 MG tablet, Take 81 mg by mouth daily., Disp: , Rfl:    atorvastatin (LIPITOR) 40 MG tablet, Take 40 mg by mouth daily., Disp: , Rfl:    CIALIS 5 MG tablet, Take 1 tablet by mouth every evening., Disp: , Rfl: 5   furosemide (LASIX) 20 MG tablet, Take one tablet by mouth, Disp: , Rfl:    JARDIANCE 25 MG TABS tablet, Take 1 tablet by mouth every evening., Disp: , Rfl: 10   losartan (COZAAR) 100 MG tablet, Take 100 mg by mouth daily., Disp: , Rfl:    metFORMIN (GLUCOPHAGE) 1000 MG tablet, 1 pill twice a day, Disp: , Rfl:    MYRBETRIQ 50 MG TB24 tablet, , Disp: , Rfl:    RYBELSUS 7 MG TABS, Take 1 tablet by mouth daily., Disp: , Rfl:    tamsulosin (FLOMAX) 0.4 MG CAPS capsule, Take 1 capsule by mouth daily., Disp: , Rfl:    vitamin E 100 UNIT capsule, Take 100 Units by mouth daily., Disp: , Rfl:    ZETIA 10 MG tablet, Take 1 tablet by mouth every morning., Disp: , Rfl:    omeprazole (PRILOSEC) 40 MG capsule, Take 1 capsule (40 mg total) by mouth daily before breakfast. About 30 mins before (Patient not taking: Reported on 05/27/2021), Disp: 90 capsule, Rfl: 3  Radiology:   No results found.  Cardiac Studies:   GATED SPECT MYO PERF W/EXERCISE STRESS 09/04/2017: Nuclear stress EF: 50%. Blood pressure demonstrated a hypertensive response to exercise. There was no ST segment deviation noted  during stress. Defect 1: There is a medium defect of severe severity present in the basal inferior, basal inferolateral, mid inferior and apical inferior location. Findings consistent with prior myocardial infarction. This is an intermediate risk study. The left ventricular ejection fraction is mildly decreased (45-54%).   There is a medium size, irreversible defect in the basal inferolateral, basal, mid and apical inferior walls consistent with prior infarction  but no ischemia. LVEF 50% with hypokinesis in the inferior walls.   Bilateral carotid artery duplex 03/04/2021 ([facility]): No evidence of significant carotid artery bifurcation stenosis  Echocardiogram 03/04/2021 ([facility]): 1. Left ventricle: The left ventricular cavity size is small. There is mild to moderate concentric hypertrophy noted. Systolic function is normal. The ejection fraction is 55% (+/-5%), estimated visually. No segmental wall motion abnormalities. The diastolic parameters in the setting of A-fib are non-diagnostic; however, the tissue doppler and 2D findings suggest elevated left atrial pressure.  2. Left atrium: The left atrium is mildly dilated as determined by left atrial volume index.   EKG:   EKG 05/27/2021: Sinus rhythm with first-degree AV block at rate of 77 bpm, left atrial enlargement, inferior infarct old.  PACs (2).  Inferior infarct old.  No significant change from 11/06/2019, PACs is new.   Assessment     ICD-10-CM   1. Syncope and collapse  R55 EKG 12-Lead    PCV MYOCARDIAL PERFUSION WITH LEXISCAN    2. Coronary artery disease involving native coronary artery of native heart without angina pectoris  I25.10 PCV MYOCARDIAL PERFUSION WITH LEXISCAN    3. Primary hypertension  I10 acebutolol (SECTRAL) 200 MG capsule    4. Pure hypercholesterolemia  E78.00     5. Orthostatic hypotension  I95.1       Medications Discontinued During This Encounter  Medication Reason   carvedilol (COREG) 12.5 MG tablet  Change in therapy    Meds ordered this encounter  Medications   acebutolol (SECTRAL) 200 MG capsule    Sig: Take 1 capsule (200 mg total) by mouth 2 (two) times daily.    Dispense:  60 capsule    Refill:  2    Discontinue Coreg   Recommendations:   [name] is a 87 y.o. male with history of CAD s/p CABG  in 2008 with LIMA to LAD, sequential saphenous vein graft to first second and third obtuse marginals, and sequential saphenous vein graft to the RCA and right PDA. He has history of hypertension, hyperlipidemia, diabetes mellitus, GERD and esophageal and gastric ulcers diagnosed in Jan 2023 for dysphagia.  Patient presents to our office to establish cardiac care and for evaluation of syncope. Patient was hospitalized at [facility] 03/03/2021 - 03/04/2021 following a syncopal episode. Previously being followed by Sherren Mocha, MD [facility]).  Patient has had a total of 5 episodes in the past 5 years.  All episodes have occurred when he standing.  He is mildly orthostatic today.  We will discontinue carvedilol and switch him to acebutolol to reduce peripheral vasodilatory effect and also see whether he would respond better with blood pressure management.  Support stocking utilization discussed.  Do not want to be too aggressive with blood pressure control.  Syncope multifactorial, probably a combination of vasovagal, orthostasis, however due to his underlying CAD, ischemic etiology cannot be excluded.  I will schedule him for Lexiscan nuclear stress test.  Patient unable to exercise due to degenerative joint disease and gait instability.  In view of multiple episodes of syncope, that are so widespread, best option is to proceed with loop implantation to evaluate for both arrhythmias and conduction system disease.  His EKG and also prior stress test revealed inferior wall scar.  Lipids are well controlled.  External labs, external echo report and carotid duplex reviewed.  Discussed with the  patient regarding loop implantation, risks and benefits, risk of trauma, infection, bleeding discussed.  Patient is willing to proceed.  Adrian Prows, MD, [facility] 05/29/2021, 10:21 AM Office: [phone] Fax: [phone] Pager: [phone]

## 2021-05-29 ENCOUNTER — Encounter: Payer: Self-pay | Admitting: Cardiology

## 2021-06-14 ENCOUNTER — Other Ambulatory Visit: Payer: Self-pay

## 2021-06-14 ENCOUNTER — Ambulatory Visit: Payer: Medicare HMO

## 2021-06-15 DIAGNOSIS — I25810 Atherosclerosis of coronary artery bypass graft(s) without angina pectoris: Secondary | ICD-10-CM | POA: Diagnosis not present

## 2021-06-15 DIAGNOSIS — I10 Essential (primary) hypertension: Secondary | ICD-10-CM | POA: Diagnosis not present

## 2021-06-15 DIAGNOSIS — E782 Mixed hyperlipidemia: Secondary | ICD-10-CM | POA: Diagnosis not present

## 2021-06-15 DIAGNOSIS — E1129 Type 2 diabetes mellitus with other diabetic kidney complication: Secondary | ICD-10-CM | POA: Diagnosis not present

## 2021-06-16 ENCOUNTER — Other Ambulatory Visit: Payer: Medicare HMO

## 2021-06-23 ENCOUNTER — Encounter: Payer: Self-pay | Admitting: Internal Medicine

## 2021-06-23 ENCOUNTER — Ambulatory Visit: Payer: Medicare HMO | Admitting: Internal Medicine

## 2021-06-23 VITALS — BP 132/74 | HR 79 | Ht 69.0 in | Wt 173.0 lb

## 2021-06-23 DIAGNOSIS — K222 Esophageal obstruction: Secondary | ICD-10-CM | POA: Diagnosis not present

## 2021-06-23 DIAGNOSIS — K219 Gastro-esophageal reflux disease without esophagitis: Secondary | ICD-10-CM | POA: Diagnosis not present

## 2021-06-23 DIAGNOSIS — K259 Gastric ulcer, unspecified as acute or chronic, without hemorrhage or perforation: Secondary | ICD-10-CM | POA: Diagnosis not present

## 2021-06-23 NOTE — Progress Notes (Signed)
? [name] y.o. 1934/09/01 [id number] ? ?Assessment & Plan:  ? ?Encounter Diagnoses  ?Name Primary?  ? GERD with stricture Yes  ? Multiple gastric ulcers   ? ?His problems appear to be resolved.  He will continue PPI therapy I reviewed the rationale for this.  See me as needed. ? ? ? ?Subjective:  ? ?Chief Complaint: Follow-up of dysphagia (GERD with stricture) and gastric ulcers ? ?HPI ?87 year old white man whom I met in January of this year with complaints of dysphagia and underwent EGD May 06, 2021 that showed reflux esophagitis and a focus of intestinal metaplasia and a stricture that I dilated.  He also had multiple benign gastric ulcers.  PPI therapy was initiated and he is asymptomatic at this time. ? ?EGD findings ?- Esophageal ulcer with no stigmata of recent bleeding. Biopsied. ?- Benign-appearing esophageal stenosis. Dilated. ?- Non-bleeding gastric ulcers with no stigmata of bleeding. Biopsied. ?- Erosive gastropathy with stigmata of recent bleeding. Biopsied. ?- Gastroesophageal flap valve classified as Hill Grade I (prominent fold, tight to endoscope). ?- Normal examined duodenum. ?1. Surgical [P], gastric antrum (ulcers) ?- ANTRAL MUCOSA WITH REACTIVE CHANGES AND HYPEREMIA. ?- NO HELICOBACTER PYLORI IDENTIFIED. ?2. Surgical [P], gastric body (nodules & erosions) ?- OXYNTIC MUCOSA WITH HYPEREMIA AND FOCAL REACTIVE CHANGES. ?- NO HELICOBACTER PYLORI IDENTIFIED. ?3. Surgical [P], GE junction (ulcer) ?- GASTROESOPHAGEAL MUCOSA WITH INTESTINAL METAPLASIA CONSISTENT WITH BARRETT'S ESOPHAGUS. ?- NO DYSPLASIA IDENTIFIED. ?Allergies  ?Allergen Reactions  ? Ketorolac Tromethamine Other (See Comments)  ?  nephrotic syndrome  ? ? ?Current Outpatient Medications:  ?  acebutolol (SECTRAL) 200 MG capsule, Take 1 capsule (200 mg total) by mouth 2 (two) times daily., Disp: 60 capsule, Rfl: 2 ?  aspirin EC 81 MG tablet, Take 81 mg by mouth daily., Disp: , Rfl:  ?  atorvastatin (LIPITOR) 40 MG  tablet, Take 40 mg by mouth daily., Disp: , Rfl:  ?  CIALIS 5 MG tablet, Take 1 tablet by mouth every evening., Disp: , Rfl: 5 ?  furosemide (LASIX) 20 MG tablet, Take one tablet by mouth, Disp: , Rfl:  ?  JARDIANCE 25 MG TABS tablet, Take 1 tablet by mouth every evening., Disp: , Rfl: 10 ?  losartan (COZAAR) 100 MG tablet, Take 100 mg by mouth daily., Disp: , Rfl:  ?  metFORMIN (GLUCOPHAGE) 1000 MG tablet, 1 pill twice a day, Disp: , Rfl:  ?  MYRBETRIQ 50 MG TB24 tablet, , Disp: , Rfl:  ?  omeprazole (PRILOSEC) 40 MG capsule, Take 1 capsule (40 mg total) by mouth daily before breakfast. About 30 mins before (Patient not taking: Reported on 05/27/2021), Disp: 90 capsule, Rfl: 3 ?  RYBELSUS 7 MG TABS, Take 1 tablet by mouth daily., Disp: , Rfl:  ?  tamsulosin (FLOMAX) 0.4 MG CAPS capsule, Take 1 capsule by mouth daily., Disp: , Rfl:  ?  vitamin E 100 UNIT capsule, Take 100 Units by mouth daily., Disp: , Rfl:  ?  ZETIA 10 MG tablet, Take 1 tablet by mouth every morning., Disp: , Rfl:  ? ?Past Medical History:  ?Diagnosis Date  ? Acute renal failure ([place])   ? secondary to nephrotic syndrome post cath, resolved with steroids  ? Anxiety   ? CAD (coronary artery disease) 04/2006  ? LIMA to LAD, sequential SVG to first, second, and third OM's, sequential SVG to mid RCA and PDA   ? Depression   ? Diabetes mellitus without complication ([place])   ? ED (erectile  dysfunction)   ? HTN (hypertension)   ? Hx of adenomatous colonic polyps   ? Hyperlipidemia   ? Obesity   ? ?Past Surgical History:  ?Procedure Laterality Date  ? COLONOSCOPY W/ BIOPSIES AND POLYPECTOMY    ? CORONARY ARTERY BYPASS GRAFT  2008  ? VESSELS X6  ? UMBILICAL HERNIA REPAIR    ? ?Social History  ? ?Social History Narrative  ? Retired Chief Strategy Officer.  Widowed since 2014.  He has multiple children.  Never smoker 5 caffeinated beverages daily and no alcohol  ? ?family history includes Clotting disorder in his father; Heart attack in his mother; Heart disease in his  sister. ? ? ?Review of Systems ?As per HPI ? ?Objective:  ? Physical Exam ?BP 132/74   Pulse 79   Ht _0  (1.753 m)   Wt 173 lb (78.5 kg)   SpO2 99%   BMI 25.55 kg/m?  ? ? ?

## 2021-06-23 NOTE — Patient Instructions (Signed)
Great to see you today. ? ?If you are age 85 or older, your body mass index should be between 23-30. Your Body mass index is 25.55 kg/m?Marland Kitchen If this is out of the aforementioned range listed, please consider follow up with your Primary Care Provider. ? ?If you are age 46 or younger, your body mass index should be between 19-25. Your Body mass index is 25.55 kg/m?Marland Kitchen If this is out of the aformentioned range listed, please consider follow up with your Primary Care Provider.  ? ?________________________________________________________ ? ?The [facility] providers would like to encourage you to use [facility] to communicate with providers for non-urgent requests or questions.  Due to long hold times on the telephone, sending your provider a message by [facility] may be a faster and more efficient way to get a response.  Please allow 48 business hours for a response.  Please remember that this is for non-urgent requests.  ?_______________________________________________________ ? ? ?I appreciate the opportunity to care for you. ?Silvano Rusk, MD, [facility] ?

## 2021-06-24 ENCOUNTER — Ambulatory Visit: Payer: Medicare HMO | Admitting: Cardiology

## 2021-06-29 ENCOUNTER — Ambulatory Visit: Payer: Medicare HMO | Admitting: Cardiology

## 2021-07-05 ENCOUNTER — Encounter: Payer: Self-pay | Admitting: Cardiology

## 2021-07-05 ENCOUNTER — Ambulatory Visit: Payer: Medicare HMO

## 2021-07-05 ENCOUNTER — Other Ambulatory Visit: Payer: Self-pay

## 2021-07-05 DIAGNOSIS — I2510 Atherosclerotic heart disease of native coronary artery without angina pectoris: Secondary | ICD-10-CM | POA: Diagnosis not present

## 2021-07-05 DIAGNOSIS — R55 Syncope and collapse: Secondary | ICD-10-CM | POA: Diagnosis not present

## 2021-07-06 NOTE — Progress Notes (Signed)
Called and spoke with patient, please give him a call about scheduling appointment for loop and echo and remember to let us know what day so that we can contact a rep. Thank you.

## 2021-07-08 NOTE — Progress Notes (Signed)
Did this get scheduled? Please advise.

## 2021-07-12 ENCOUNTER — Other Ambulatory Visit: Payer: Self-pay

## 2021-07-12 ENCOUNTER — Ambulatory Visit: Payer: Medicare HMO

## 2021-07-12 DIAGNOSIS — N1831 Chronic kidney disease, stage 3a: Secondary | ICD-10-CM | POA: Diagnosis not present

## 2021-07-12 DIAGNOSIS — I10 Essential (primary) hypertension: Secondary | ICD-10-CM | POA: Diagnosis not present

## 2021-07-12 DIAGNOSIS — I2510 Atherosclerotic heart disease of native coronary artery without angina pectoris: Secondary | ICD-10-CM

## 2021-07-12 DIAGNOSIS — F3341 Major depressive disorder, recurrent, in partial remission: Secondary | ICD-10-CM | POA: Diagnosis not present

## 2021-07-12 DIAGNOSIS — E669 Obesity, unspecified: Secondary | ICD-10-CM | POA: Diagnosis not present

## 2021-07-12 DIAGNOSIS — I700 Atherosclerosis of aorta: Secondary | ICD-10-CM | POA: Diagnosis not present

## 2021-07-12 DIAGNOSIS — R9439 Abnormal result of other cardiovascular function study: Secondary | ICD-10-CM | POA: Diagnosis not present

## 2021-07-12 DIAGNOSIS — I25810 Atherosclerosis of coronary artery bypass graft(s) without angina pectoris: Secondary | ICD-10-CM | POA: Diagnosis not present

## 2021-07-12 DIAGNOSIS — E1129 Type 2 diabetes mellitus with other diabetic kidney complication: Secondary | ICD-10-CM | POA: Diagnosis not present

## 2021-07-12 DIAGNOSIS — F5221 Male erectile disorder: Secondary | ICD-10-CM | POA: Diagnosis not present

## 2021-07-12 DIAGNOSIS — E782 Mixed hyperlipidemia: Secondary | ICD-10-CM | POA: Diagnosis not present

## 2021-07-19 ENCOUNTER — Ambulatory Visit: Payer: Medicare HMO | Admitting: Cardiology

## 2021-07-28 ENCOUNTER — Encounter: Payer: Self-pay | Admitting: Cardiology

## 2021-07-28 ENCOUNTER — Ambulatory Visit: Payer: Medicare HMO | Admitting: Cardiology

## 2021-07-28 VITALS — BP 177/98 | HR 75 | Temp 97.8°F | Resp 16 | Ht 69.0 in | Wt 176.0 lb

## 2021-07-28 DIAGNOSIS — I25118 Atherosclerotic heart disease of native coronary artery with other forms of angina pectoris: Secondary | ICD-10-CM

## 2021-07-28 DIAGNOSIS — R55 Syncope and collapse: Secondary | ICD-10-CM

## 2021-07-28 DIAGNOSIS — Z95818 Presence of other cardiac implants and grafts: Secondary | ICD-10-CM | POA: Insufficient documentation

## 2021-07-28 HISTORY — DX: Presence of other cardiac implants and grafts: Z95.818

## 2021-07-28 NOTE — Progress Notes (Signed)
? ? ?  Chief Complaint  ?Patient presents with  ? loop implant  ?  syncope  ? Syncope and collapse  ? Loss of Consciousness  ?  ? ?Brief History: [name] is a 87 y.o. male with history of CAD s/p CABG  in 2008 with LIMA to LAD, sequential saphenous vein graft to first second and third obtuse marginals, and sequential saphenous vein graft to the RCA and right PDA. He has history of hypertension, hyperlipidemia, diabetes mellitus, GERD and esophageal and gastric ulcers diagnosed in Jan 2023 for dysphagia.  Patient presents to our office to establish cardiac care and for evaluation of syncope. ? ?Patient was hospitalized at [facility] 03/03/2021 - 03/04/2021 following a syncopal episode.   ? ? ?Blood pressure (!) 177/98, pulse 75, temperature 97.8 ?F (36.6 ?C), resp. rate 16, height '5\' 9"'$  (1.753 m), weight 176 lb (79.8 kg), SpO2 98 %.  ? ?Physical Exam ?Neck:  ?   Vascular: No JVD.  ?Cardiovascular:  ?   Rate and Rhythm: Normal rate and regular rhythm.  ?   Pulses: Intact distal pulses.  ?   Heart sounds: Normal heart sounds. No murmur heard. ?  No gallop.  ?Pulmonary:  ?   Effort: Pulmonary effort is normal.  ?   Breath sounds: Normal breath sounds.  ?Abdominal:  ?   General: Bowel sounds are normal.  ?   Palpations: Abdomen is soft.  ?Musculoskeletal:  ?   Right lower leg: Edema (1-2 + ankle edema) present.  ?   Left lower leg: Edema (1-2 plus ankle) present.  ?  ? ?Prodedure performed: Insertion of Biotronik Loop Recorder. Serial # [id number] ? ?Indication: Recurrent Syncope ?After obtaining informed consent, explaining the procedure to the patient, under sterile precautions, local anesthesia with 1% lidocaine with epinephrine was injected subcutaneously in the left parasternal region.  20 mL utilized.  A small nick was made in the left paraspinal region at the 3rd intercostal space. ?Biotronik Biomonitor III loop recorder was inserted without any complications.  Patient tolerated the procedure well.  The  incision was closed with Derna Bond and Steri-Strips.  There is no blood loss, no hematoma. ? ?Written instrtuctions regarding wound care given to the patient. ? ?Device setting: ? ?R wave amplitude 1.85m.  ? ?Programmed:  ?AF detection to Low ?HVR 160/min. ?Bradycardia 45 BPM ?Sudden rate drop 50 bpm ?Asystole 3 Sec ?Patient Trigger: On ? ? ? ? ?  ICD-10-CM   ?1. Syncope and collapse  R55   ?  ?2. Atherosclerosis of native coronary artery of native heart with stable angina pectoris ([facility]  I25.118   ?  ?  ? ? ?JAdrian Prows MD, [facility]?07/28/2021, 9:01 AM ?Office: [phone]?Fax: [phone]?Pager: [phone]  ? ?

## 2021-08-05 ENCOUNTER — Encounter: Payer: Self-pay | Admitting: Cardiology

## 2021-08-05 ENCOUNTER — Ambulatory Visit: Payer: Medicare HMO | Admitting: Cardiology

## 2021-08-05 VITALS — BP 183/75 | HR 66 | Temp 97.7°F | Resp 17 | Ht 69.0 in | Wt 178.8 lb

## 2021-08-05 DIAGNOSIS — I10 Essential (primary) hypertension: Secondary | ICD-10-CM | POA: Diagnosis not present

## 2021-08-05 DIAGNOSIS — I25118 Atherosclerotic heart disease of native coronary artery with other forms of angina pectoris: Secondary | ICD-10-CM

## 2021-08-05 DIAGNOSIS — Z95818 Presence of other cardiac implants and grafts: Secondary | ICD-10-CM

## 2021-08-05 DIAGNOSIS — R55 Syncope and collapse: Secondary | ICD-10-CM

## 2021-08-05 MED ORDER — OLMESARTAN MEDOXOMIL-HCTZ 40-25 MG PO TABS: 1.0000 | ORAL_TABLET | ORAL | 2 refills | Status: DC

## 2021-08-05 NOTE — Progress Notes (Signed)
? ?Primary Physician/Referring:  Ginger Organ., MD ? ?Patient ID: [name], male    DOB: [birth year], 87 y.o.   MRN: [id number] ? ?Chief Complaint  ?Patient presents with  ? Wound Check  ? ?HPI:   ? ?[name]  is a 87 y.o. male with history of CAD s/p CABG  in 2008 with LIMA to LAD, sequential saphenous vein graft to first second and third obtuse marginals, and sequential saphenous vein graft to the RCA and right PDA. He has history of hypertension, hyperlipidemia, diabetes mellitus, GERD and esophageal and gastric ulcers diagnosed in Jan 2023 for dysphagia.    ? ?Patient was hospitalized at [facility] 03/03/2021 - 03/04/2021 following a syncopal episode. Patient has had a total of 5 episodes of syncope in the past 5 years, in the last 6 months he has had 2 episodes.  All episodes have occurred while he is standing up.  He did have some premonitory symptoms. He underwent Biotronik biomonitor 3 implantation due to recurrent syncope on 07/28/2021.  He now presents for follow-up. ? ?He remains active, except for degenerative joint disease no other specific symptoms, denies chest pain or dyspnea. ? ?Past Medical History:  ?Diagnosis Date  ? Acute renal failure ([place])   ? secondary to nephrotic syndrome post cath, resolved with steroids  ? Anxiety   ? CAD (coronary artery disease) 04/2006  ? LIMA to LAD, sequential SVG to first, second, and third OM's, sequential SVG to mid RCA and PDA   ? Depression   ? Diabetes mellitus without complication ([place])   ? ED (erectile dysfunction)   ? HTN (hypertension)   ? Hx of adenomatous colonic polyps   ? Hyperlipidemia   ? Loop recorder: Biotronic Biomonitor II loop 07/28/2021 07/28/2021  ? Obesity   ? ?Past Surgical History:  ?Procedure Laterality Date  ? COLONOSCOPY W/ BIOPSIES AND POLYPECTOMY    ? CORONARY ARTERY BYPASS GRAFT  2008  ? VESSELS X6  ? LOOP RECORDER IMPLANT    ? UMBILICAL HERNIA REPAIR    ? ?Family History  ?Problem Relation Age of Onset  ? Heart  attack Mother   ? Clotting disorder Father   ?     died of renal disease  ? Heart disease Sister   ? Cancer Sister   ? Colon cancer Neg Hx   ?  ?Social History  ? ?Tobacco Use  ? Smoking status: Never  ? Smokeless tobacco: Never  ?Substance Use Topics  ? Alcohol use: No  ?  Alcohol/week: 1.0 standard drink  ?  Types: 1 Glasses of wine per week  ? ?Marital Status: Widowed  ? ?ROS  ?Review of Systems  ?Cardiovascular:  Negative for chest pain, dyspnea on exertion and leg swelling.  ?Gastrointestinal:  Positive for constipation.  ? ?Objective  ?Blood pressure (!) 183/75, pulse 66, temperature 97.7 ?F (36.5 ?C), temperature source Temporal, resp. rate 17, height '5\' 9"'$  (1.753 m), weight 178 lb 12.8 oz (81.1 kg), SpO2 95 %.  ? ?  08/05/2021  ?  8:34 AM 07/28/2021  ?  8:11 AM 06/23/2021  ?  9:10 AM  ?Vitals with BMI  ?Height '5\' 9"'$  '5\' 9"'$  '5\' 9"'$   ?Weight 178 lbs 13 oz 176 lbs 173 lbs  ?BMI 26.39 25.98 25.54  ?Systolic 174 944 967  ?Diastolic 75 98 74  ?Pulse 66 75 79  ?  ?Physical Exam ?Neck:  ?   Vascular: No carotid bruit or JVD.  ?Cardiovascular:  ?  Rate and Rhythm: Normal rate and regular rhythm. Occasional Extrasystoles are present. ?   Pulses: Intact distal pulses.  ?   Heart sounds: Normal heart sounds. No murmur heard. ?  No gallop.  ?Pulmonary:  ?   Effort: Pulmonary effort is normal.  ?   Breath sounds: Normal breath sounds.  ?Abdominal:  ?   General: Bowel sounds are normal.  ?   Palpations: Abdomen is soft.  ?Musculoskeletal:  ?   Right lower leg: Edema (2+ ankle edema) present.  ?   Left lower leg: Edema (2+ ankle edema) present.  ? ?Laboratory examination:  ? ?External labs:  ?03/03/2021: ?Magnesium 2.0 ?Sodium 137, potassium 4.3, BUN 29, creatinine 1.45, GFR 48 ?CBC Hgb 14.9, HCT 44, MCV 97, platelet 201 ?TSH 1.66 ?High-sensitivity troponin 6 ?A1c 7.1% ? ?10/06/2020: ? ?Total cholesterol 129, triglycerides 92, HDL 53, LDL 58.  Non-HDL cholesterol 76.  Apolipoprotein B 75, normal. ? ?A1c 7.2%.  TSH normal at  0.97. ? ?Allergies  ? ?Allergies  ?Allergen Reactions  ? Ketorolac Tromethamine Other (See Comments)  ?  nephrotic syndrome  ?  ? ?Final Medications at End of Visit   ? ? ?Current Outpatient Medications:  ?  acebutolol (SECTRAL) 200 MG capsule, Take 1 capsule (200 mg total) by mouth 2 (two) times daily., Disp: 60 capsule, Rfl: 2 ?  aspirin EC 81 MG tablet, Take 81 mg by mouth daily., Disp: , Rfl:  ?  atorvastatin (LIPITOR) 40 MG tablet, Take 40 mg by mouth daily., Disp: , Rfl:  ?  CIALIS 5 MG tablet, Take 1 tablet by mouth every evening., Disp: , Rfl: 5 ?  furosemide (LASIX) 20 MG tablet, Take 1 tablet by mouth daily as needed. Take in morning, Disp: , Rfl:  ?  JARDIANCE 25 MG TABS tablet, Take 1 tablet by mouth every evening., Disp: , Rfl: 10 ?  metFORMIN (GLUCOPHAGE) 1000 MG tablet, 1 pill twice a day, Disp: , Rfl:  ?  MYRBETRIQ 50 MG TB24 tablet, , Disp: , Rfl:  ?  olmesartan-hydrochlorothiazide (BENICAR HCT) 40-25 MG tablet, Take 1 tablet by mouth every morning., Disp: 30 tablet, Rfl: 2 ?  RYBELSUS 7 MG TABS, Take 1 tablet by mouth daily., Disp: , Rfl:  ?  tamsulosin (FLOMAX) 0.4 MG CAPS capsule, Take 1 capsule by mouth daily., Disp: , Rfl:  ?  vitamin E 100 UNIT capsule, Take 100 Units by mouth daily., Disp: , Rfl:  ?  ZETIA 10 MG tablet, Take 1 tablet by mouth every morning., Disp: , Rfl:  ?  omeprazole (PRILOSEC) 40 MG capsule, Take 1 capsule (40 mg total) by mouth daily before breakfast. About 30 mins before (Patient not taking: Reported on 08/05/2021), Disp: 90 capsule, Rfl: 3 ? ?Radiology:  ? ?No results found. ? ?Cardiac Studies:  ? ?Bilateral carotid artery duplex 03/04/2021 ([facility]): ?No evidence of significant carotid artery bifurcation stenosis ? ?Echocardiogram 03/04/2021 ([facility]): ?1. Left ventricle: The left ventricular cavity size is small. There is mild to moderate concentric hypertrophy noted. Systolic function is normal. The ejection fraction is 55% (+/-5%), estimated visually. No segmental wall  motion abnormalities. The diastolic parameters in the setting of A-fib are non-diagnostic; however, the tissue doppler and 2D findings suggest elevated left atrial pressure.  ?2. Left atrium: The left atrium is mildly dilated as determined by left atrial volume index.  ? ?PCV MYOCARDIAL PERFUSION WITH LEXISCAN 07/05/2021   ?Nondiagnostic ECG stress. The heart rate response was consistent with Lexiscan. Occasional PACs and PVCs. ?  There is a reversible moderate defect in the inferior and apical regions. ?Overall LV systolic function is abnormal with global hypokinesis and inferior akinesis. Stress LV EF: 28%. ?Compared to the report of study done on 09/04/2017, inferior wall defect noted as scar without ischemia, LVEF was mildly decreased at 50%.  High risk study due to marked decrease in LVEF. ? ?EKG:  ? ?EKG 05/27/2021: Sinus rhythm with first-degree AV block at rate of 77 bpm, left atrial enlargement, inferior infarct old.  PACs (2).  Inferior infarct old.  No significant change from 11/06/2019, PACs is new.  ? ?Assessment  ? ?  ICD-10-CM   ?1. Syncope and collapse  R55   ?  ?2. Loop recorder: Biotronic Biomonitor II loop 07/28/2021  Z95.818   ?  ?3. Atherosclerosis of native coronary artery of native heart with stable angina pectoris ([place])  I25.118   ?  ?4. Primary hypertension  I10 olmesartan-hydrochlorothiazide (BENICAR HCT) 40-25 MG tablet  ?  Basic metabolic panel  ?  ?  ?Medications Discontinued During This Encounter  ?Medication Reason  ? ezetimibe (ZETIA) 10 MG tablet   ? losartan (COZAAR) 100 MG tablet Change in therapy  ? carvedilol (COREG) 12.5 MG tablet   ?  ?Meds ordered this encounter  ?Medications  ? olmesartan-hydrochlorothiazide (BENICAR HCT) 40-25 MG tablet  ?  Sig: Take 1 tablet by mouth every morning.  ?  Dispense:  30 tablet  ?  Refill:  2  ?  Discontinue Losartan  ? ?Recommendations:  ? ?TREDARIUS COBERN is a 87 y.o. male with history of CAD s/p CABG  in 2008 with LIMA to LAD, sequential  saphenous vein graft to first second and third obtuse marginals, and sequential saphenous vein graft to the RCA and right PDA. He has history of hypertension, hyperlipidemia, diabetes mellitus, GERD and esophageal an

## 2021-08-08 ENCOUNTER — Encounter: Payer: Self-pay | Admitting: Cardiology

## 2021-08-15 DIAGNOSIS — E782 Mixed hyperlipidemia: Secondary | ICD-10-CM | POA: Diagnosis not present

## 2021-08-15 DIAGNOSIS — I25810 Atherosclerosis of coronary artery bypass graft(s) without angina pectoris: Secondary | ICD-10-CM | POA: Diagnosis not present

## 2021-08-15 DIAGNOSIS — E1129 Type 2 diabetes mellitus with other diabetic kidney complication: Secondary | ICD-10-CM | POA: Diagnosis not present

## 2021-08-15 DIAGNOSIS — I10 Essential (primary) hypertension: Secondary | ICD-10-CM | POA: Diagnosis not present

## 2021-09-09 ENCOUNTER — Ambulatory Visit: Payer: Medicare HMO | Admitting: Cardiovascular Disease

## 2021-09-28 ENCOUNTER — Other Ambulatory Visit: Payer: Self-pay

## 2021-09-28 ENCOUNTER — Emergency Department (HOSPITAL_BASED_OUTPATIENT_CLINIC_OR_DEPARTMENT_OTHER): Payer: Medicare HMO

## 2021-09-28 ENCOUNTER — Encounter (HOSPITAL_BASED_OUTPATIENT_CLINIC_OR_DEPARTMENT_OTHER): Payer: Self-pay | Admitting: Emergency Medicine

## 2021-09-28 ENCOUNTER — Emergency Department (HOSPITAL_BASED_OUTPATIENT_CLINIC_OR_DEPARTMENT_OTHER)
Admission: EM | Admit: 2021-09-28 | Discharge: 2021-09-28 | Disposition: A | Discharge status: Home / Self Care | Payer: Medicare HMO | Attending: Emergency Medicine | Admitting: Emergency Medicine

## 2021-09-28 DIAGNOSIS — K0820 Unspecified atrophy of edentulous alveolar ridge: Secondary | ICD-10-CM | POA: Diagnosis not present

## 2021-09-28 DIAGNOSIS — R079 Chest pain, unspecified: Secondary | ICD-10-CM | POA: Diagnosis not present

## 2021-09-28 DIAGNOSIS — Z7982 Long term (current) use of aspirin: Secondary | ICD-10-CM | POA: Diagnosis not present

## 2021-09-28 DIAGNOSIS — R1084 Generalized abdominal pain: Secondary | ICD-10-CM | POA: Diagnosis not present

## 2021-09-28 DIAGNOSIS — N281 Cyst of kidney, acquired: Secondary | ICD-10-CM | POA: Diagnosis not present

## 2021-09-28 DIAGNOSIS — K8020 Calculus of gallbladder without cholecystitis without obstruction: Secondary | ICD-10-CM | POA: Diagnosis not present

## 2021-09-28 DIAGNOSIS — K760 Fatty (change of) liver, not elsewhere classified: Secondary | ICD-10-CM | POA: Diagnosis not present

## 2021-09-28 DIAGNOSIS — Z79899 Other long term (current) drug therapy: Secondary | ICD-10-CM | POA: Diagnosis not present

## 2021-09-28 DIAGNOSIS — R911 Solitary pulmonary nodule: Secondary | ICD-10-CM | POA: Diagnosis not present

## 2021-09-28 DIAGNOSIS — R9431 Abnormal electrocardiogram [ECG] [EKG]: Secondary | ICD-10-CM | POA: Diagnosis not present

## 2021-09-28 LAB — URINALYSIS, ROUTINE W REFLEX MICROSCOPIC
Bilirubin Urine: NEGATIVE
Glucose, UA: >1000 mg/dL — AB
Hgb urine dipstick: NEGATIVE
Ketones, ur: NEGATIVE mg/dL
Leukocytes,Ua: NEGATIVE
Nitrite: NEGATIVE
Protein, ur: 30 mg/dL — AB
Specific Gravity, Urine: 1.035 — ABNORMAL HIGH (ref 1.005–1.030)
pH: 7.5 (ref 5.0–8.0)

## 2021-09-28 LAB — COMPREHENSIVE METABOLIC PANEL
ALT: 18 U/L (ref 0–44)
AST: 20 U/L (ref 15–41)
Albumin: 4.5 g/dL (ref 3.5–5.0)
Alkaline Phosphatase: 61 U/L (ref 38–126)
Anion gap: 11 (ref 5–15)
BUN: 28 mg/dL — ABNORMAL HIGH (ref 8–23)
CO2: 25 mmol/L (ref 22–32)
Calcium: 11.4 mg/dL — ABNORMAL HIGH (ref 8.9–10.3)
Chloride: 98 mmol/L (ref 98–111)
Creatinine, Ser: 1.15 mg/dL (ref 0.61–1.24)
GFR, Estimated: >60 mL/min (ref >60)
Glucose, Bld: 249 mg/dL — ABNORMAL HIGH (ref 70–99)
Potassium: 4.1 mmol/L (ref 3.5–5.1)
Sodium: 134 mmol/L — ABNORMAL LOW (ref 135–145)
Total Bilirubin: 0.8 mg/dL (ref 0.3–1.2)
Total Protein: 8.1 g/dL (ref 6.5–8.1)

## 2021-09-28 LAB — CBC
HCT: 47.9 % (ref 39.0–52.0)
Hemoglobin: 16.5 g/dL (ref 13.0–17.0)
MCH: 31.9 pg (ref 26.0–34.0)
MCHC: 34.4 g/dL (ref 30.0–36.0)
MCV: 92.5 fL (ref 80.0–100.0)
Platelets: 200 10*3/uL (ref 150–400)
RBC: 5.18 MIL/uL (ref 4.22–5.81)
RDW: 12.3 % (ref 11.5–15.5)
WBC: 16.4 10*3/uL — ABNORMAL HIGH (ref 4.0–10.5)
nRBC: 0 % (ref 0.0–0.2)

## 2021-09-28 LAB — TROPONIN I (HIGH SENSITIVITY)
Troponin I (High Sensitivity): 7 ng/L (ref <18)
Troponin I (High Sensitivity): 7 ng/L (ref <18)

## 2021-09-28 LAB — LIPASE, BLOOD: Lipase: 98 U/L — ABNORMAL HIGH (ref 11–51)

## 2021-09-28 MED ORDER — ACETAMINOPHEN 325 MG PO TABS
650.0000 mg | ORAL_TABLET | Freq: Once | ORAL | Status: AC
  Administered 2021-09-28: 650 mg via ORAL
  Filled 2021-09-28: qty 2

## 2021-09-28 MED ORDER — IOHEXOL 350 MG/ML SOLN
100.0000 mL | Freq: Once | INTRAVENOUS | Status: AC | PRN
Start: 2021-09-28 — End: 2021-09-28
  Administered 2021-09-28: 100 mL via INTRAVENOUS

## 2021-09-28 MED ORDER — SODIUM CHLORIDE 0.9 % IV BOLUS
500.0000 mL | Freq: Once | INTRAVENOUS | Status: AC
Start: 2021-09-28 — End: 2021-09-28
  Administered 2021-09-28: 500 mL via INTRAVENOUS

## 2021-09-28 NOTE — ED Notes (Signed)
Patient transported to CT 

## 2021-09-28 NOTE — ED Triage Notes (Signed)
Abdo pain. Radiates into shoulder. Some chest pain. Started this AM. Constant. Denies n/v/d.

## 2021-09-28 NOTE — ED Provider Notes (Signed)
[place] EMERGENCY DEPT Provider Note   CSN: [id number] Arrival date & time: 09/28/21  1846     History  Chief Complaint  Patient presents with   Abdominal Pain   Chest Pain    [name] is a 87 y.o. male.  Patient presents ER chief complaint of lower abdominal pain describes an aching and sharp and then radiated up to his chest.  Symptoms had started midday and but persisted all day into the night and presents to the ER here.  Denies fevers or cough denies vomiting or diarrhea describes the pain is moderate.  Initially was not sure if she wanted pain medications but agreeable to some now.  Patient states symptoms started after he ate some food he thinks may have been spoiled.       Home Medications Prior to Admission medications   Medication Sig Start Date End Date Taking? Authorizing Provider  acebutolol (SECTRAL) 200 MG capsule Take 1 capsule (200 mg total) by mouth 2 (two) times daily. 05/27/21  Yes Adrian Prows, MD  aspirin EC 81 MG tablet Take 81 mg by mouth daily.   Yes [provider]  atorvastatin (LIPITOR) 40 MG tablet Take 40 mg by mouth daily.   Yes [provider]  CIALIS 5 MG tablet Take 1 tablet by mouth every evening. 02/28/15  Yes [provider]  JARDIANCE 25 MG TABS tablet Take 1 tablet by mouth every evening. 02/28/15  Yes [provider]  metFORMIN (GLUCOPHAGE) 1000 MG tablet 1 pill twice a day 12/28/19  Yes [provider]  MYRBETRIQ 50 MG TB24 tablet  06/08/20  Yes [provider]  olmesartan-hydrochlorothiazide (BENICAR HCT) 40-25 MG tablet Take 1 tablet by mouth every morning. 08/05/21  Yes Adrian Prows, MD  tamsulosin (FLOMAX) 0.4 MG CAPS capsule Take 1 capsule by mouth daily. 09/18/19  Yes [provider]  vitamin E 100 UNIT capsule Take 100 Units by mouth daily.   Yes [provider]  furosemide (LASIX) 20 MG tablet Take 1 tablet by mouth daily as needed. Take in  morning 12/21/10   Bensimhon, Shaune Pascal, MD  omeprazole (PRILOSEC) 40 MG capsule Take 1 capsule (40 mg total) by mouth daily before breakfast. About 30 mins before Patient not taking: Reported on 08/05/2021 05/06/21   Gatha Mayer, MD  RYBELSUS 7 MG TABS Take 1 tablet by mouth daily. 09/24/19   [provider]  ZETIA 10 MG tablet Take 1 tablet by mouth every morning. 03/04/15   [provider]      Allergies    Ketorolac tromethamine    Review of Systems   Review of Systems  Constitutional:  Negative for fever.  HENT:  Negative for ear pain and sore throat.   Eyes:  Negative for pain.  Respiratory:  Negative for cough.   Cardiovascular:  Positive for chest pain.  Gastrointestinal:  Positive for abdominal pain.  Genitourinary:  Negative for flank pain.  Musculoskeletal:  Negative for back pain.  Skin:  Negative for color change and rash.  Neurological:  Negative for syncope.  All other systems reviewed and are negative.   Physical Exam Updated Vital Signs BP (!) 162/87   Pulse (!) 57   Temp 98.7 F (37.1 C) (Oral)   Resp 20   Ht '5\' 9"'$  (1.753 m)   Wt 77.1 kg   SpO2 97%   BMI 25.10 kg/m  Physical Exam Constitutional:      Appearance: He is well-developed.  HENT:     Head: Normocephalic.     Nose: Nose normal.  Eyes:     Extraocular Movements: Extraocular movements intact.  Cardiovascular:     Rate and Rhythm: Normal rate.  Pulmonary:     Effort: Pulmonary effort is normal.  Abdominal:     Comments: Mild tenderness in the suprapubic region radiating up to the periumbilical region.  Skin:    Coloration: Skin is not jaundiced.  Neurological:     Mental Status: He is alert. Mental status is at baseline.     ED Results / Procedures / Treatments   Labs (all labs ordered are listed, but only abnormal results are displayed) Labs Reviewed  LIPASE, BLOOD - Abnormal; Notable for the following components:      Result Value   Lipase 98 (*)    All other  components within normal limits  COMPREHENSIVE METABOLIC PANEL - Abnormal; Notable for the following components:   Sodium 134 (*)    Glucose, Bld 249 (*)    BUN 28 (*)    Calcium 11.4 (*)    All other components within normal limits  CBC - Abnormal; Notable for the following components:   WBC 16.4 (*)    All other components within normal limits  URINALYSIS, ROUTINE W REFLEX MICROSCOPIC - Abnormal; Notable for the following components:   Color, Urine COLORLESS (*)    Specific Gravity, Urine 1.035 (*)    Glucose, UA >1,000 (*)    Protein, ur 30 (*)    All other components within normal limits  TROPONIN I (HIGH SENSITIVITY)  TROPONIN I (HIGH SENSITIVITY)    EKG EKG Interpretation  Date/Time:  Tuesday September 28 2021 19:04:48 EDT Ventricular Rate:  69 PR Interval:  256 QRS Duration: 86 QT Interval:  406 QTC Calculation: 435 R Axis:   35 Text Interpretation: Sinus rhythm with 1st degree A-V block with Premature supraventricular complexes Minimal voltage criteria for LVH, may be normal variant ( R in aVL ) Inferior infarct , age undetermined Anterior infarct , age undetermined Abnormal ECG Confirmed by Thamas Jaegers ([id number]) on 09/28/2021 8:26:32 PM  Radiology US Abdomen Limited RUQ (LIVER/GB)  Result Date: 09/28/2021 CLINICAL DATA:  Abdominal pain. EXAM: ULTRASOUND ABDOMEN LIMITED RIGHT UPPER QUADRANT COMPARISON:  CT dated 09/28/2021. FINDINGS: Gallbladder: Several small gallstones. Minimally thickened gallbladder wall measuring 4 mm. No pericholecystic fluid. Negative sonographic Murphy's sign. Common bile duct: Diameter: 4 mm Liver: There is diffuse increased liver echogenicity most commonly seen in the setting of fatty infiltration. Superimposed inflammation or fibrosis is not excluded. Clinical correlation is recommended. Portal vein is patent on color Doppler imaging with normal direction of blood flow towards the liver. Other: None. IMPRESSION: 1. Cholelithiasis without sonographic  evidence of acute cholecystitis. 2. Fatty liver. Electronically Signed   By: Anner Crete M.D.   On: 09/28/2021 22:42   CT Angio Chest/Abd/Pel for Dissection W and/or Wo Contrast  Result Date: 09/28/2021 CLINICAL DATA:  Acute aortic syndrome suspected. EXAM: CT ANGIOGRAPHY CHEST, ABDOMEN AND PELVIS TECHNIQUE: Non-contrast CT of the chest was initially obtained. Multidetector CT imaging through the chest, abdomen and pelvis was performed using the standard protocol during bolus administration of intravenous contrast. Multiplanar reconstructed images and MIPs were obtained and reviewed to evaluate the vascular anatomy. RADIATION DOSE REDUCTION: This exam was performed according to the departmental dose-optimization program which includes automated exposure control, adjustment of the mA and/or kV according to patient size and/or use of iterative reconstruction technique. CONTRAST:  183m OMNIPAQUE  IOHEXOL 350 MG/ML SOLN COMPARISON:  CT abdomen pelvis dated 03/19/2020. FINDINGS: CTA CHEST FINDINGS Cardiovascular: There is no cardiomegaly or pericardial effusion. 3 vessel coronary vascular calcification and postsurgical changes of CABG. Mild atherosclerotic calcification of the thoracic aorta. No aneurysmal dilatation or dissection. The origins of the great vessels of the aortic arch appear patent. No pulmonary artery embolus identified. Mediastinum/Nodes: No hilar or mediastinal adenopathy. The esophagus and thyroid gland are grossly unremarkable. No mediastinal fluid collection. Lungs/Pleura: There are bibasilar dependent atelectasis. There is a 3 mm right upper lobe subpleural nodule (56/8). No focal consolidation, pleural effusion or pneumothorax. The central airways are patent. Musculoskeletal: Degenerative changes of the spine. Median sternotomy wires. No acute osseous pathology. Recording device for pacer in the subcutaneous soft tissues of the left anterior chest wall. Review of the MIP images confirms  the above findings. CTA ABDOMEN AND PELVIS FINDINGS VASCULAR Aorta: Moderate atherosclerotic calcification. No aneurysmal dilatation or dissection. No periaortic fluid collection. Celiac: Patent without evidence of aneurysm, dissection, vasculitis or significant stenosis. SMA: Atherosclerotic calcification of the SMA. The SMA remains patent. Renals: Atherosclerotic calcification of the origins of the renal arteries. The renal arteries are patent. IMA: Patent without evidence of aneurysm, dissection, vasculitis or significant stenosis. Inflow: Atherosclerotic calcification of the iliac arteries. The iliac arteries are patent. No aneurysmal dilatation or dissection. Veins: No obvious venous abnormality within the limitations of this arterial phase study. Review of the MIP images confirms the above findings. NON-VASCULAR No intra-abdominal free air or free fluid. Hepatobiliary: Fatty liver. No intrahepatic biliary dilatation. Multiple gallstones. Mild thickened appearance of the gallbladder wall. Correlation with ultrasound is recommended to exclude acute cholecystitis. Pancreas: Unremarkable. No pancreatic ductal dilatation or surrounding inflammatory changes. Spleen: Normal in size without focal abnormality. Adrenals/Urinary Tract: The adrenal glands are unremarkable. There is a 2.5 cm right renal upper pole cyst. Additional subcentimeter hypodense lesion from the inferior pole of the right kidney is too small to characterize. There is no hydronephrosis on either side. The visualized ureters and urinary bladder appear unremarkable. Stomach/Bowel: There is no bowel obstruction or active inflammation. The appendix is normal. Lymphatic: No adenopathy. Reproductive: Enlarged prostate gland measuring 6 cm in transverse axial diameter. The seminal vesicles are symmetric. Other: None Musculoskeletal: Degenerative changes of the spine. No acute osseous pathology. Review of the MIP images confirms the above findings.  IMPRESSION: 1. No aortic aneurysm or dissection. 2. No pulmonary artery embolus identified. 3. Cholelithiasis with mild thickened appearance of the gallbladder wall. Correlation with ultrasound is recommended to exclude acute cholecystitis. 4. Fatty liver. 5. There is a 3 mm right upper lobe subpleural nodule. No follow-up needed if patient is low-risk.This recommendation follows the consensus statement: Guidelines for Management of Incidental Pulmonary Nodules Detected on CT Images: From the Fleischner Society 2017; Radiology 2017; 284:228-243. 6. Aortic Atherosclerosis (ICD10-I70.0). Electronically Signed   By: Anner Crete M.D.   On: 09/28/2021 20:59    Procedures Procedures    Medications Ordered in ED Medications  sodium chloride 0.9 % bolus 500 mL (0 mLs Intravenous Stopped 09/28/21 2132)  iohexol (OMNIPAQUE) 350 MG/ML injection 100 mL (100 mLs Intravenous Contrast Given 09/28/21 2021)  acetaminophen (TYLENOL) tablet 650 mg (650 mg Oral Given 09/28/21 2305)    ED Course/ Medical Decision Making/ A&P                           Medical Decision Making Amount and/or Complexity of Data Reviewed Labs: ordered.  Radiology: ordered.  Risk OTC drugs. Prescription drug management.  History obtained from family at bedside.  Cardiac monitoring showing sinus rhythm.  Review of record shows office visit August 15, 2021 for hypertension.  On exam,  No pulsatile mass palpated.  CT abdomen pelvis pursued with no acute findings per radiology Labs showed normal chemistry urinalysis unremarkable white count elevated 16 however lipase 98.  Patient given Tylenol here with improvement of symptoms.  Given IV fluid resuscitation.  Ultrasound pursued for questionable cholelithiasis, no evidence of cholecystitis on ultrasound.  Given negative work-up ultimately discharged home.  Advise follow-up with her primary care doctor in 3 to 4 days, advised immediate return to the ER for worsening pain fevers or any  additional concerns.          Final Clinical Impression(s) / ED Diagnoses Final diagnoses:  Generalized abdominal pain    Rx / DC Orders ED Discharge Orders     None         Luna Fuse, MD 09/28/21 2317

## 2021-09-28 NOTE — Discharge Instructions (Signed)
Follow-up with your primary care doctor in 3 to 4 days.  However if your symptoms worsen if you have fevers or vomiting return back to the ER.

## 2021-09-30 DIAGNOSIS — R1084 Generalized abdominal pain: Secondary | ICD-10-CM | POA: Diagnosis not present

## 2021-09-30 DIAGNOSIS — R748 Abnormal levels of other serum enzymes: Secondary | ICD-10-CM | POA: Diagnosis not present

## 2021-09-30 DIAGNOSIS — R634 Abnormal weight loss: Secondary | ICD-10-CM | POA: Diagnosis not present

## 2021-09-30 DIAGNOSIS — D72829 Elevated white blood cell count, unspecified: Secondary | ICD-10-CM | POA: Diagnosis not present

## 2021-09-30 DIAGNOSIS — I129 Hypertensive chronic kidney disease with stage 1 through stage 4 chronic kidney disease, or unspecified chronic kidney disease: Secondary | ICD-10-CM | POA: Diagnosis not present

## 2021-09-30 DIAGNOSIS — R55 Syncope and collapse: Secondary | ICD-10-CM | POA: Diagnosis not present

## 2021-09-30 DIAGNOSIS — N1831 Chronic kidney disease, stage 3a: Secondary | ICD-10-CM | POA: Diagnosis not present

## 2021-10-04 DIAGNOSIS — M531 Cervicobrachial syndrome: Secondary | ICD-10-CM | POA: Diagnosis not present

## 2021-10-04 DIAGNOSIS — R293 Abnormal posture: Secondary | ICD-10-CM | POA: Diagnosis not present

## 2021-10-04 DIAGNOSIS — M542 Cervicalgia: Secondary | ICD-10-CM | POA: Diagnosis not present

## 2021-10-06 DIAGNOSIS — M531 Cervicobrachial syndrome: Secondary | ICD-10-CM | POA: Diagnosis not present

## 2021-10-08 DIAGNOSIS — M5459 Other low back pain: Secondary | ICD-10-CM | POA: Diagnosis not present

## 2021-10-10 ENCOUNTER — Other Ambulatory Visit: Payer: Self-pay | Admitting: Cardiology

## 2021-10-10 DIAGNOSIS — I10 Essential (primary) hypertension: Secondary | ICD-10-CM

## 2021-10-11 DIAGNOSIS — M5459 Other low back pain: Secondary | ICD-10-CM | POA: Diagnosis not present

## 2021-10-13 DIAGNOSIS — M542 Cervicalgia: Secondary | ICD-10-CM | POA: Diagnosis not present

## 2021-10-13 DIAGNOSIS — M531 Cervicobrachial syndrome: Secondary | ICD-10-CM | POA: Diagnosis not present

## 2021-10-13 DIAGNOSIS — R293 Abnormal posture: Secondary | ICD-10-CM | POA: Diagnosis not present

## 2021-10-15 DIAGNOSIS — E782 Mixed hyperlipidemia: Secondary | ICD-10-CM | POA: Diagnosis not present

## 2021-10-15 DIAGNOSIS — N1831 Chronic kidney disease, stage 3a: Secondary | ICD-10-CM | POA: Diagnosis not present

## 2021-10-15 DIAGNOSIS — M542 Cervicalgia: Secondary | ICD-10-CM | POA: Diagnosis not present

## 2021-10-15 DIAGNOSIS — E1129 Type 2 diabetes mellitus with other diabetic kidney complication: Secondary | ICD-10-CM | POA: Diagnosis not present

## 2021-10-15 DIAGNOSIS — M531 Cervicobrachial syndrome: Secondary | ICD-10-CM | POA: Diagnosis not present

## 2021-10-15 DIAGNOSIS — R293 Abnormal posture: Secondary | ICD-10-CM | POA: Diagnosis not present

## 2021-10-15 DIAGNOSIS — I10 Essential (primary) hypertension: Secondary | ICD-10-CM | POA: Diagnosis not present

## 2021-10-20 DIAGNOSIS — I10 Essential (primary) hypertension: Secondary | ICD-10-CM | POA: Diagnosis not present

## 2021-10-20 DIAGNOSIS — E1129 Type 2 diabetes mellitus with other diabetic kidney complication: Secondary | ICD-10-CM | POA: Diagnosis not present

## 2021-10-20 DIAGNOSIS — Z125 Encounter for screening for malignant neoplasm of prostate: Secondary | ICD-10-CM | POA: Diagnosis not present

## 2021-10-20 DIAGNOSIS — E782 Mixed hyperlipidemia: Secondary | ICD-10-CM | POA: Diagnosis not present

## 2021-10-22 DIAGNOSIS — M542 Cervicalgia: Secondary | ICD-10-CM | POA: Diagnosis not present

## 2021-10-22 DIAGNOSIS — M531 Cervicobrachial syndrome: Secondary | ICD-10-CM | POA: Diagnosis not present

## 2021-10-22 DIAGNOSIS — R293 Abnormal posture: Secondary | ICD-10-CM | POA: Diagnosis not present

## 2021-10-26 DIAGNOSIS — Z0000 Encounter for general adult medical examination without abnormal findings: Secondary | ICD-10-CM | POA: Diagnosis not present

## 2021-10-26 DIAGNOSIS — I700 Atherosclerosis of aorta: Secondary | ICD-10-CM | POA: Diagnosis not present

## 2021-10-26 DIAGNOSIS — E782 Mixed hyperlipidemia: Secondary | ICD-10-CM | POA: Diagnosis not present

## 2021-10-26 DIAGNOSIS — D692 Other nonthrombocytopenic purpura: Secondary | ICD-10-CM | POA: Diagnosis not present

## 2021-10-26 DIAGNOSIS — E669 Obesity, unspecified: Secondary | ICD-10-CM | POA: Diagnosis not present

## 2021-10-26 DIAGNOSIS — I129 Hypertensive chronic kidney disease with stage 1 through stage 4 chronic kidney disease, or unspecified chronic kidney disease: Secondary | ICD-10-CM | POA: Diagnosis not present

## 2021-10-26 DIAGNOSIS — R82998 Other abnormal findings in urine: Secondary | ICD-10-CM | POA: Diagnosis not present

## 2021-10-26 DIAGNOSIS — Z1331 Encounter for screening for depression: Secondary | ICD-10-CM | POA: Diagnosis not present

## 2021-10-26 DIAGNOSIS — I25810 Atherosclerosis of coronary artery bypass graft(s) without angina pectoris: Secondary | ICD-10-CM | POA: Diagnosis not present

## 2021-10-26 DIAGNOSIS — E1129 Type 2 diabetes mellitus with other diabetic kidney complication: Secondary | ICD-10-CM | POA: Diagnosis not present

## 2021-10-26 DIAGNOSIS — N1831 Chronic kidney disease, stage 3a: Secondary | ICD-10-CM | POA: Diagnosis not present

## 2021-10-26 DIAGNOSIS — Z1339 Encounter for screening examination for other mental health and behavioral disorders: Secondary | ICD-10-CM | POA: Diagnosis not present

## 2021-11-05 DIAGNOSIS — M531 Cervicobrachial syndrome: Secondary | ICD-10-CM | POA: Diagnosis not present

## 2021-11-05 DIAGNOSIS — M542 Cervicalgia: Secondary | ICD-10-CM | POA: Diagnosis not present

## 2021-11-05 DIAGNOSIS — R293 Abnormal posture: Secondary | ICD-10-CM | POA: Diagnosis not present

## 2021-11-08 DIAGNOSIS — R293 Abnormal posture: Secondary | ICD-10-CM | POA: Diagnosis not present

## 2021-11-08 DIAGNOSIS — M531 Cervicobrachial syndrome: Secondary | ICD-10-CM | POA: Diagnosis not present

## 2021-11-08 DIAGNOSIS — M542 Cervicalgia: Secondary | ICD-10-CM | POA: Diagnosis not present

## 2021-11-10 DIAGNOSIS — M542 Cervicalgia: Secondary | ICD-10-CM | POA: Diagnosis not present

## 2021-11-10 DIAGNOSIS — R293 Abnormal posture: Secondary | ICD-10-CM | POA: Diagnosis not present

## 2021-11-10 DIAGNOSIS — M531 Cervicobrachial syndrome: Secondary | ICD-10-CM | POA: Diagnosis not present

## 2021-11-11 DIAGNOSIS — M542 Cervicalgia: Secondary | ICD-10-CM | POA: Diagnosis not present

## 2021-11-11 DIAGNOSIS — M531 Cervicobrachial syndrome: Secondary | ICD-10-CM | POA: Diagnosis not present

## 2021-11-11 DIAGNOSIS — R293 Abnormal posture: Secondary | ICD-10-CM | POA: Diagnosis not present

## 2021-11-15 ENCOUNTER — Other Ambulatory Visit: Payer: Self-pay | Admitting: Cardiology

## 2021-11-15 DIAGNOSIS — M531 Cervicobrachial syndrome: Secondary | ICD-10-CM | POA: Diagnosis not present

## 2021-11-15 DIAGNOSIS — I10 Essential (primary) hypertension: Secondary | ICD-10-CM

## 2021-11-15 DIAGNOSIS — R293 Abnormal posture: Secondary | ICD-10-CM | POA: Diagnosis not present

## 2021-11-15 DIAGNOSIS — M542 Cervicalgia: Secondary | ICD-10-CM | POA: Diagnosis not present

## 2021-11-16 ENCOUNTER — Encounter: Payer: Self-pay | Admitting: Physical Medicine and Rehabilitation

## 2021-11-16 ENCOUNTER — Ambulatory Visit: Payer: Medicare HMO | Admitting: Physical Medicine and Rehabilitation

## 2021-11-16 VITALS — BP 144/80 | HR 75 | Ht 69.0 in | Wt 170.0 lb

## 2021-11-16 DIAGNOSIS — M25551 Pain in right hip: Secondary | ICD-10-CM

## 2021-11-16 DIAGNOSIS — M48061 Spinal stenosis, lumbar region without neurogenic claudication: Secondary | ICD-10-CM | POA: Diagnosis not present

## 2021-11-16 DIAGNOSIS — M47816 Spondylosis without myelopathy or radiculopathy, lumbar region: Secondary | ICD-10-CM

## 2021-11-16 DIAGNOSIS — M25552 Pain in left hip: Secondary | ICD-10-CM

## 2021-11-16 NOTE — Progress Notes (Unsigned)
Numeric Pain Rating Scale and Functional Assessment Average Pain 7 Pain is better now. # weeks ago it was a 10. Doing much better now with PT.  In the last MONTH (on 0-10 scale) has pain interfered with the following?  1. General activity like being  able to carry out your everyday physical activities such as walking, climbing stairs, carrying groceries, or moving a chair?  Rating(2)   +Driver, -BT, -Dye Allergies.

## 2021-11-16 NOTE — Progress Notes (Unsigned)
VI WHITESEL - 87 y.o. male MRN [id number]  Date of birth: [birth year]  Office Visit Note: Visit Date: 11/16/2021 PCP: Ginger Organ., MD Referred by: Ginger Organ., MD  Subjective: Chief Complaint  Patient presents with   Lower Back - Pain   Right Hip - Pain   Left Hip - Pain   HPI: [name] is a 87 y.o. male who comes in today for evaluation of chronic bilateral lower back pain radiating to buttocks and right more than left hip pain. Patient reports pain ongoing for many years and is exacerbated by movement and activity. He describes pain as sore and aching sensation, denies pain at present. He reports significant relief of pain with recent formal physical therapy at [facility]. Patient states he has attended physical therapy for the last 4 months and reports good relief of pain with use of TENS unit and manual treatments. Patient has been managed from a hip aspect by Dr. Jean Rosenthal, bilateral hip x-ray imaging from earlier this year exhibits mild narrowing bilateral hip joints. We have completed multiple intra-articular injections to right hip in our office over the years with good relief, we have not completed lumbar epidural steroid injection. Lumbar MRI imaging from 2021 exhibits 4 mm anterolisthesis with advanced facet degeneration and moderate spinal canal stenosis at L4-L5. Patient does have history of previous lumbar epidural steroid injections with Dr. Laroy Apple, states these injections provided minimal relief of pain. Overall, patient states he is doing well and managing chronic pain at home, he has plans to continued with physical therapy and home exercises. Patient denies focal weakness, numbness and tingling. Patient denies recent trauma or falls.   Review of Systems  Musculoskeletal:  Positive for back pain and joint pain.  Neurological:  Negative for tingling, sensory change, focal weakness and weakness.  All other systems reviewed and are  negative.  Otherwise per HPI.  Assessment & Plan: Visit Diagnoses:    ICD-10-CM   1. Spondylosis without myelopathy or radiculopathy, lumbar region  M47.816     2. Spinal stenosis of lumbar region without neurogenic claudication  M48.061     3. Pain in right hip  M25.551     4. Pain in left hip  M25.552        Plan: Findings:  1. Chronic bilateral lower back pain radiating to buttocks. Patient continues to manage pain with conservative therapies such as home exercise regimen and formal physical therapy. If his pain returns we did discuss possibility of performing diagnostic lumbar epidural steroid injection. No red flag symptoms noted upon exam today.   2. Chronic right greater than left hip pain. He does have history of right intra-articular hip injections. Patient is pain free at this time and managing his pain with continued physical therapy. No pain with internal/external rotation of hips upon exam today. We do not feel repeating hip injection at this time would be beneficial. Patient instructed to let us know if his pain returns.     Meds & Orders: No orders of the defined types were placed in this encounter.  No orders of the defined types were placed in this encounter.   Follow-up: Return if symptoms worsen or fail to improve.   Procedures: No procedures performed      Clinical History: EXAM: MRI LUMBAR SPINE WITHOUT CONTRAST   TECHNIQUE: Multiplanar, multisequence MR imaging of the lumbar spine was performed. No intravenous contrast was administered.   COMPARISON:  Lumbar MRI  12/28/2017   FINDINGS: Segmentation:  Normal   Alignment: 4 mm anterolisthesis L4-5 unchanged. Remaining alignment normal.   Vertebrae:  Negative for fracture or mass.  No bone marrow edema.   Conus medullaris and cauda equina: Conus extends to the L1 level. Conus and cauda equina appear normal.   Paraspinal and other soft tissues: Negative for paraspinous mass or adenopathy. No it  soft tissue edema or fluid collection.   Disc levels:   T12-L1: Negative   L1-2: Mild disc and mild facet degeneration. Negative for disc protrusion or stenosis.   L2-3: Diffuse disc bulging and endplate spurring. Bilateral facet hypertrophy. Mild spinal stenosis and moderate subarticular stenosis bilaterally.   L3-4: Disc degeneration with diffuse disc bulging and endplate spurring. Bilateral facet hypertrophy. Mild spinal stenosis and moderate subarticular stenosis bilaterally due to spurring. . Interval resolution of small synovial cyst on the left.   L4-5: 4 mm anterolisthesis with advanced facet degeneration. Disc degeneration with diffuse disc bulging. Moderate spinal stenosis. Moderate to severe subarticular stenosis bilaterally. No change since the prior MRI.   L5-S1: Disc degeneration with diffuse endplate spurring. Mild facet degeneration. Moderate subarticular and foraminal stenosis on the right. Mild subarticular stenosis on the left.   IMPRESSION: Multilevel degenerative change throughout the lumbar spine similar to the prior MRI. There is resolution of the left-sided small synovial cyst at L3-4. No new or acute findings.     Electronically Signed   By: Franchot Gallo M.D.   On: 10/13/2019 09:37   He reports that he has never smoked. He has never used smokeless tobacco. No results for input(s): "HGBA1C", "LABURIC" in the last 8760 hours.  Objective:  VS:  HT:'5\' 9"'$  (175.3 cm)   WT:170 lb (77.1 kg)  BMI:25.09    BP:(!) 144/80  HR:75bpm  TEMP: ( )  RESP:  Physical Exam Vitals and nursing note reviewed.  HENT:     Head: Normocephalic and atraumatic.     Right Ear: External ear normal.     Left Ear: External ear normal.     Nose: Nose normal.     Mouth/Throat:     Mouth: Mucous membranes are moist.  Eyes:     Extraocular Movements: Extraocular movements intact.  Cardiovascular:     Rate and Rhythm: Normal rate.     Pulses: Normal pulses.  Pulmonary:      Effort: Pulmonary effort is normal.  Abdominal:     General: Abdomen is flat. There is no distension.  Musculoskeletal:        General: Tenderness present.     Cervical back: Normal range of motion.     Comments: Pt is slow to rise from seated position to standing. Good lumbar range of motion. Strong distal strength without clonus, no pain upon palpation of greater trochanters. No pain with internal/external rotation of bilateral hips. Sensation intact bilaterally. Walks independently, gait steady.   Skin:    General: Skin is warm and dry.     Capillary Refill: Capillary refill takes less than 2 seconds.  Neurological:     General: No focal deficit present.     Mental Status: He is alert and oriented to person, place, and time.  Psychiatric:        Mood and Affect: Mood normal.        Behavior: Behavior normal.    Ortho Exam  Imaging: No results found.  Past Medical/Family/Surgical/Social History: Medications & Allergies reviewed per EMR, new medications updated. Patient Active Problem List  Diagnosis Date Noted   Loop recorder: Biotronic Biomonitor II loop 07/28/2021 07/28/2021   Unsteady gait 08/05/2020   Anxiety disorder 02/02/2018   Right hip pain 10/02/2017   Recurrent major depression in remission ([place]) 01/27/2017   Chronic kidney disease, stage 3a ([place]) 07/17/2015   Personal history of colonic adenomas 03/26/2013   Obesity 10/04/2012   OBSTRUCTIVE SLEEP APNEA 10/30/2008   HYPERLIPIDEMIA-MIXED 07/01/2008   HYPERTENSION, BENIGN 07/01/2008   CAD, NATIVE VESSEL 07/01/2008   FATIGUE / MALAISE 07/01/2008   EDEMA 07/01/2008   Past Medical History:  Diagnosis Date   Acute renal failure (HCC)    secondary to nephrotic syndrome post cath, resolved with steroids   Anxiety    CAD (coronary artery disease) 04/2006   LIMA to LAD, sequential SVG to first, second, and third OM's, sequential SVG to mid RCA and PDA    Depression    Diabetes mellitus without complication  (HCC)    ED (erectile dysfunction)    HTN (hypertension)    Hx of adenomatous colonic polyps    Hyperlipidemia    Loop recorder: Biotronic Biomonitor II loop 07/28/2021 07/28/2021   Obesity    Family History  Problem Relation Age of Onset   Heart attack Mother    Clotting disorder Father        died of renal disease   Heart disease Sister    Cancer Sister    Colon cancer Neg Hx    Past Surgical History:  Procedure Laterality Date   COLONOSCOPY W/ BIOPSIES AND POLYPECTOMY     CORONARY ARTERY BYPASS GRAFT  2008   VESSELS X6   LOOP RECORDER IMPLANT     UMBILICAL HERNIA REPAIR     Social History   Occupational History   Occupation: Surveyor, mining  Tobacco Use   Smoking status: Never   Smokeless tobacco: Never  Vaping Use   Vaping Use: Never used  Substance and Sexual Activity   Alcohol use: No    Alcohol/week: 1.0 standard drink of alcohol    Types: 1 Glasses of wine per week   Drug use: No   Sexual activity: Not on file

## 2021-11-18 DIAGNOSIS — M542 Cervicalgia: Secondary | ICD-10-CM | POA: Diagnosis not present

## 2021-11-18 DIAGNOSIS — M531 Cervicobrachial syndrome: Secondary | ICD-10-CM | POA: Diagnosis not present

## 2021-11-18 DIAGNOSIS — R293 Abnormal posture: Secondary | ICD-10-CM | POA: Diagnosis not present

## 2021-11-26 DIAGNOSIS — M531 Cervicobrachial syndrome: Secondary | ICD-10-CM | POA: Diagnosis not present

## 2021-11-26 DIAGNOSIS — R293 Abnormal posture: Secondary | ICD-10-CM | POA: Diagnosis not present

## 2021-11-26 DIAGNOSIS — M542 Cervicalgia: Secondary | ICD-10-CM | POA: Diagnosis not present

## 2021-12-08 DIAGNOSIS — E119 Type 2 diabetes mellitus without complications: Secondary | ICD-10-CM | POA: Diagnosis not present

## 2021-12-10 DIAGNOSIS — R293 Abnormal posture: Secondary | ICD-10-CM | POA: Diagnosis not present

## 2021-12-10 DIAGNOSIS — M531 Cervicobrachial syndrome: Secondary | ICD-10-CM | POA: Diagnosis not present

## 2021-12-10 DIAGNOSIS — M542 Cervicalgia: Secondary | ICD-10-CM | POA: Diagnosis not present

## 2021-12-11 ENCOUNTER — Other Ambulatory Visit: Payer: Self-pay | Admitting: Cardiology

## 2021-12-11 DIAGNOSIS — I10 Essential (primary) hypertension: Secondary | ICD-10-CM

## 2021-12-13 DIAGNOSIS — M531 Cervicobrachial syndrome: Secondary | ICD-10-CM | POA: Diagnosis not present

## 2021-12-13 DIAGNOSIS — M542 Cervicalgia: Secondary | ICD-10-CM | POA: Diagnosis not present

## 2021-12-13 DIAGNOSIS — R293 Abnormal posture: Secondary | ICD-10-CM | POA: Diagnosis not present

## 2021-12-15 DIAGNOSIS — R293 Abnormal posture: Secondary | ICD-10-CM | POA: Diagnosis not present

## 2021-12-15 DIAGNOSIS — M531 Cervicobrachial syndrome: Secondary | ICD-10-CM | POA: Diagnosis not present

## 2021-12-15 DIAGNOSIS — M542 Cervicalgia: Secondary | ICD-10-CM | POA: Diagnosis not present

## 2021-12-16 DIAGNOSIS — Z4509 Encounter for adjustment and management of other cardiac device: Secondary | ICD-10-CM | POA: Diagnosis not present

## 2021-12-16 DIAGNOSIS — R55 Syncope and collapse: Secondary | ICD-10-CM | POA: Diagnosis not present

## 2021-12-16 DIAGNOSIS — Z95818 Presence of other cardiac implants and grafts: Secondary | ICD-10-CM | POA: Diagnosis not present

## 2022-01-09 ENCOUNTER — Other Ambulatory Visit: Payer: Self-pay | Admitting: Cardiology

## 2022-01-09 DIAGNOSIS — I10 Essential (primary) hypertension: Secondary | ICD-10-CM

## 2022-01-16 DIAGNOSIS — R55 Syncope and collapse: Secondary | ICD-10-CM | POA: Diagnosis not present

## 2022-01-16 DIAGNOSIS — Z4509 Encounter for adjustment and management of other cardiac device: Secondary | ICD-10-CM | POA: Diagnosis not present

## 2022-01-16 DIAGNOSIS — Z95818 Presence of other cardiac implants and grafts: Secondary | ICD-10-CM | POA: Diagnosis not present

## 2022-01-18 DIAGNOSIS — M542 Cervicalgia: Secondary | ICD-10-CM | POA: Diagnosis not present

## 2022-01-18 DIAGNOSIS — M531 Cervicobrachial syndrome: Secondary | ICD-10-CM | POA: Diagnosis not present

## 2022-01-18 DIAGNOSIS — R293 Abnormal posture: Secondary | ICD-10-CM | POA: Diagnosis not present

## 2022-01-19 DIAGNOSIS — M531 Cervicobrachial syndrome: Secondary | ICD-10-CM | POA: Diagnosis not present

## 2022-01-19 DIAGNOSIS — M542 Cervicalgia: Secondary | ICD-10-CM | POA: Diagnosis not present

## 2022-01-19 DIAGNOSIS — R293 Abnormal posture: Secondary | ICD-10-CM | POA: Diagnosis not present

## 2022-01-20 DIAGNOSIS — M531 Cervicobrachial syndrome: Secondary | ICD-10-CM | POA: Diagnosis not present

## 2022-01-20 DIAGNOSIS — M542 Cervicalgia: Secondary | ICD-10-CM | POA: Diagnosis not present

## 2022-01-20 DIAGNOSIS — R293 Abnormal posture: Secondary | ICD-10-CM | POA: Diagnosis not present

## 2022-02-07 DIAGNOSIS — R229 Localized swelling, mass and lump, unspecified: Secondary | ICD-10-CM | POA: Diagnosis not present

## 2022-02-07 DIAGNOSIS — L578 Other skin changes due to chronic exposure to nonionizing radiation: Secondary | ICD-10-CM | POA: Diagnosis not present

## 2022-02-07 DIAGNOSIS — L821 Other seborrheic keratosis: Secondary | ICD-10-CM | POA: Diagnosis not present

## 2022-02-07 DIAGNOSIS — L853 Xerosis cutis: Secondary | ICD-10-CM | POA: Diagnosis not present

## 2022-02-07 DIAGNOSIS — L570 Actinic keratosis: Secondary | ICD-10-CM | POA: Diagnosis not present

## 2022-02-07 DIAGNOSIS — Z08 Encounter for follow-up examination after completed treatment for malignant neoplasm: Secondary | ICD-10-CM | POA: Diagnosis not present

## 2022-02-07 DIAGNOSIS — Z85828 Personal history of other malignant neoplasm of skin: Secondary | ICD-10-CM | POA: Diagnosis not present

## 2022-02-16 DIAGNOSIS — Z4509 Encounter for adjustment and management of other cardiac device: Secondary | ICD-10-CM | POA: Diagnosis not present

## 2022-02-16 DIAGNOSIS — Z95818 Presence of other cardiac implants and grafts: Secondary | ICD-10-CM | POA: Diagnosis not present

## 2022-02-16 DIAGNOSIS — R55 Syncope and collapse: Secondary | ICD-10-CM | POA: Diagnosis not present

## 2022-02-19 ENCOUNTER — Encounter: Payer: Self-pay | Admitting: Cardiology

## 2022-02-20 ENCOUNTER — Other Ambulatory Visit: Payer: Self-pay | Admitting: Cardiology

## 2022-02-20 DIAGNOSIS — I10 Essential (primary) hypertension: Secondary | ICD-10-CM

## 2022-02-28 DIAGNOSIS — E782 Mixed hyperlipidemia: Secondary | ICD-10-CM | POA: Diagnosis not present

## 2022-02-28 DIAGNOSIS — N1831 Chronic kidney disease, stage 3a: Secondary | ICD-10-CM | POA: Diagnosis not present

## 2022-02-28 DIAGNOSIS — I25810 Atherosclerosis of coronary artery bypass graft(s) without angina pectoris: Secondary | ICD-10-CM | POA: Diagnosis not present

## 2022-02-28 DIAGNOSIS — E669 Obesity, unspecified: Secondary | ICD-10-CM | POA: Diagnosis not present

## 2022-02-28 DIAGNOSIS — I700 Atherosclerosis of aorta: Secondary | ICD-10-CM | POA: Diagnosis not present

## 2022-02-28 DIAGNOSIS — D692 Other nonthrombocytopenic purpura: Secondary | ICD-10-CM | POA: Diagnosis not present

## 2022-02-28 DIAGNOSIS — I129 Hypertensive chronic kidney disease with stage 1 through stage 4 chronic kidney disease, or unspecified chronic kidney disease: Secondary | ICD-10-CM | POA: Diagnosis not present

## 2022-02-28 DIAGNOSIS — E1129 Type 2 diabetes mellitus with other diabetic kidney complication: Secondary | ICD-10-CM | POA: Diagnosis not present

## 2022-02-28 DIAGNOSIS — Z23 Encounter for immunization: Secondary | ICD-10-CM | POA: Diagnosis not present

## 2022-03-08 ENCOUNTER — Other Ambulatory Visit: Payer: Self-pay | Admitting: Cardiology

## 2022-03-08 DIAGNOSIS — I10 Essential (primary) hypertension: Secondary | ICD-10-CM

## 2022-03-16 ENCOUNTER — Other Ambulatory Visit: Payer: Self-pay | Admitting: Cardiology

## 2022-03-16 DIAGNOSIS — I10 Essential (primary) hypertension: Secondary | ICD-10-CM

## 2022-03-19 DIAGNOSIS — Z95818 Presence of other cardiac implants and grafts: Secondary | ICD-10-CM | POA: Diagnosis not present

## 2022-03-19 DIAGNOSIS — Z4509 Encounter for adjustment and management of other cardiac device: Secondary | ICD-10-CM | POA: Diagnosis not present

## 2022-03-19 DIAGNOSIS — R55 Syncope and collapse: Secondary | ICD-10-CM | POA: Diagnosis not present

## 2022-04-05 ENCOUNTER — Encounter: Payer: Self-pay | Admitting: Cardiology

## 2022-04-05 ENCOUNTER — Ambulatory Visit: Payer: Medicare HMO | Admitting: Cardiology

## 2022-04-05 DIAGNOSIS — R55 Syncope and collapse: Secondary | ICD-10-CM | POA: Diagnosis not present

## 2022-04-05 DIAGNOSIS — Z95818 Presence of other cardiac implants and grafts: Secondary | ICD-10-CM

## 2022-04-05 DIAGNOSIS — Z4509 Encounter for adjustment and management of other cardiac device: Secondary | ICD-10-CM

## 2022-04-05 NOTE — Progress Notes (Signed)
Chief Complaint  Patient presents with   Loop Recorder    Loop Check   Remote loop recorder check 02/16/2022:  Predominant rhythm is normal sinus rhythm.  Atrial monitoring episodes = PACs.  No symptoms reported.  No heart block.  Unscheduled (Alert) 04/04/2022: Frequent  brief episodes of atrial fibrillation with RVR.  Device setting:  R wave amplitude 1.105m.   Programmed changes :  AF detection to Medium. HVR 160/min changed to 140/min. HVR counter increased from 16 beats to 48 beats Bradycardia 45 BPM for 20 beats, changed PVC detection from large to small morphology PVCs to decrease frequent bradycardia false alerts. Sudden rate drop 50 bpm Asystole 3 Sec Patient Trigger: ON  Impression Loop recorder check performed today, patient having frequent episodes of brief atrial fibrillation with RVR.  No heart block, no symptoms reported.  Will increase detection of atrial fibrillation by reducing the heart rate down to 140 bpm and increase high ventricular rate compared from 16 beats to 48 bpm to evaluate atrial fibrillation burden.    ICD-10-CM   1. Encounter for loop recorder check  Z45.09     2. Loop recorder: Biotronic Biomonitor II loop 07/28/2021  Z95.818     3. Syncope and collapse  R55        Recommendation: Patient with very brief episodes of atrial fibrillation with rapid ventricular response, no sustained episodes.  Will change the parameters to improve sensitivity of detection of atrial fibrillation burden so we could decide upon need for anticoagulation long-term.  No precardiac changes were performed today.    JAdrian Prows MD, [facility]12/19/2023, 3:36 PM Office: [phone]: [phone]: [phone]      CC: DLang Snow MD

## 2022-04-06 ENCOUNTER — Telehealth: Payer: Self-pay | Admitting: Physical Medicine and Rehabilitation

## 2022-04-06 NOTE — Telephone Encounter (Signed)
Pt called in requesting appointment for back injection... Pt requesting callback.Marland KitchenMarland Kitchen

## 2022-04-08 NOTE — Telephone Encounter (Signed)
Spoke with patient and he is requesting a right hip injection. He is having the same type of pain as before. Scheduled injection for 04/19/22

## 2022-04-19 ENCOUNTER — Ambulatory Visit: Payer: Self-pay

## 2022-04-19 ENCOUNTER — Encounter: Payer: Self-pay | Admitting: Physical Medicine and Rehabilitation

## 2022-04-19 ENCOUNTER — Ambulatory Visit (INDEPENDENT_AMBULATORY_CARE_PROVIDER_SITE_OTHER): Payer: HMO | Admitting: Physical Medicine and Rehabilitation

## 2022-04-19 DIAGNOSIS — M25551 Pain in right hip: Secondary | ICD-10-CM

## 2022-04-19 DIAGNOSIS — M4316 Spondylolisthesis, lumbar region: Secondary | ICD-10-CM | POA: Diagnosis not present

## 2022-04-19 DIAGNOSIS — R2681 Unsteadiness on feet: Secondary | ICD-10-CM

## 2022-04-19 DIAGNOSIS — M47816 Spondylosis without myelopathy or radiculopathy, lumbar region: Secondary | ICD-10-CM | POA: Diagnosis not present

## 2022-04-19 DIAGNOSIS — Z4509 Encounter for adjustment and management of other cardiac device: Secondary | ICD-10-CM | POA: Diagnosis not present

## 2022-04-19 DIAGNOSIS — M48062 Spinal stenosis, lumbar region with neurogenic claudication: Secondary | ICD-10-CM

## 2022-04-19 DIAGNOSIS — R55 Syncope and collapse: Secondary | ICD-10-CM | POA: Diagnosis not present

## 2022-04-19 DIAGNOSIS — Z95818 Presence of other cardiac implants and grafts: Secondary | ICD-10-CM | POA: Diagnosis not present

## 2022-04-19 NOTE — Progress Notes (Signed)
[name] - 88 y.o. male MRN [id number]  Date of birth: [birth year]  Office Visit Note: Visit Date: 04/19/2022 PCP: Ginger Organ., MD Referred by: Ginger Organ., MD  Subjective: Chief Complaint  Patient presents with   Right Hip - Pain   HPI: [name] is a 88 y.o. male who comes in todayfor evaluation of chronic bilateral lower back pain radiating to buttocks and right more than left hip pain.  He was seen by my nurse practitioner, Barnet Pall, FNP, in August.  Patient reports pain ongoing for many years and is exacerbated by movement and activity.  His biggest complaint currently is right hip pain which she can hardly stand well at this point.  He rates his pain as a 5 out of 10 with difficulty with activities of daily living.  He describes pain as sore and aching sensation, denies pain at present. He reports significant relief of pain with recent formal physical therapy at [facility]. Patient states he has attended physical therapy for the last 4 months and reports good relief of pain with use of TENS unit and manual treatments. Patient has been managed from a hip aspect by Dr. Jean Rosenthal, bilateral hip x-ray imaging from earlier this year exhibits mild narrowing bilateral hip joints. We have completed multiple intra-articular injections to right hip in our office over the years with good relief, we have not completed lumbar epidural steroid injection. Lumbar MRI imaging from 2021 exhibits 4 mm anterolisthesis with advanced facet degeneration and moderate spinal canal stenosis at L4-L5. Patient does have history of previous lumbar epidural steroid injections with Dr. Laroy Apple, states these injections provided minimal relief of pain. Overall, patient states he is doing well and managing chronic pain at home, he has plans to continued with physical therapy and home exercises. Patient denies focal weakness, numbness and tingling. Patient denies recent trauma  or falls.       Review of Systems  Musculoskeletal:  Positive for back pain and joint pain.  Neurological:  Positive for weakness.  All other systems reviewed and are negative.  Otherwise per HPI.  Assessment & Plan: Visit Diagnoses:    ICD-10-CM   1. Pain in right hip  M25.551 XR C-ARM NO REPORT    Large Joint Inj: R hip joint    2. Spondylosis without myelopathy or radiculopathy, lumbar region  M47.816     3. Spondylolisthesis of lumbar region  M43.16     4. Spinal stenosis of lumbar region with neurogenic claudication  M48.062     5. Unsteady gait  R26.81        Plan: Findings:  1. Chronic bilateral lower back pain radiating to buttocks. Patient continues to manage pain with conservative therapies such as home exercise regimen and formal physical therapy. If his pain returns we did discuss possibility of performing diagnostic lumbar epidural steroid injection. No red flag symptoms noted upon exam today.  Consider updated imaging.   2. Chronic right greater than left hip pain. He does have history of right intra-articular hip injections.  He does have pain with internal rotation today going from sit to stand.  Did complete intra-articular injection with fluoroscopic guidance today diagnostically and hopefully therapeutically.    Meds & Orders: No orders of the defined types were placed in this encounter.   Orders Placed This Encounter  Procedures   Large Joint Inj: R hip joint   XR C-ARM NO REPORT    Follow-up: Return  if symptoms worsen or fail to improve.   Procedures: Large Joint Inj: R hip joint on 04/19/2022 3:05 PM Indications: diagnostic evaluation and pain Details: 22 G 3.5 in needle, fluoroscopy-guided anterior approach  Arthrogram: No  Medications: 4 mL bupivacaine 0.25 %; 40 mg triamcinolone acetonide 40 MG/ML Outcome: tolerated well, no immediate complications  There was excellent flow of contrast producing a partial arthrogram of the hip. The patient  did not have relief of symptoms during the anesthetic phase of the injection. Procedure, treatment alternatives, risks and benefits explained, specific risks discussed. Consent was given by the patient. Immediately prior to procedure a time out was called to verify the correct patient, procedure, equipment, support staff and site/side marked as required. Patient was prepped and draped in the usual sterile fashion.          Clinical History: EXAM: MRI LUMBAR SPINE WITHOUT CONTRAST   TECHNIQUE: Multiplanar, multisequence MR imaging of the lumbar spine was performed. No intravenous contrast was administered.   COMPARISON:  Lumbar MRI 12/28/2017   FINDINGS: Segmentation:  Normal   Alignment: 4 mm anterolisthesis L4-5 unchanged. Remaining alignment normal.   Vertebrae:  Negative for fracture or mass.  No bone marrow edema.   Conus medullaris and cauda equina: Conus extends to the L1 level. Conus and cauda equina appear normal.   Paraspinal and other soft tissues: Negative for paraspinous mass or adenopathy. No it soft tissue edema or fluid collection.   Disc levels:   T12-L1: Negative   L1-2: Mild disc and mild facet degeneration. Negative for disc protrusion or stenosis.   L2-3: Diffuse disc bulging and endplate spurring. Bilateral facet hypertrophy. Mild spinal stenosis and moderate subarticular stenosis bilaterally.   L3-4: Disc degeneration with diffuse disc bulging and endplate spurring. Bilateral facet hypertrophy. Mild spinal stenosis and moderate subarticular stenosis bilaterally due to spurring. . Interval resolution of small synovial cyst on the left.   L4-5: 4 mm anterolisthesis with advanced facet degeneration. Disc degeneration with diffuse disc bulging. Moderate spinal stenosis. Moderate to severe subarticular stenosis bilaterally. No change since the prior MRI.   L5-S1: Disc degeneration with diffuse endplate spurring. Mild facet degeneration. Moderate  subarticular and foraminal stenosis on the right. Mild subarticular stenosis on the left.   IMPRESSION: Multilevel degenerative change throughout the lumbar spine similar to the prior MRI. There is resolution of the left-sided small synovial cyst at L3-4. No new or acute findings.     Electronically Signed   By: Franchot Gallo M.D.   On: 10/13/2019 09:37   He reports that he has never smoked. He has never used smokeless tobacco. No results for input(s): "HGBA1C", "LABURIC" in the last 8760 hours.  Objective:  VS:  HT:    WT:   BMI:     BP:   HR: bpm  TEMP: ( )  RESP:  Physical Exam Vitals and nursing note reviewed.  Constitutional:      General: He is not in acute distress.    Appearance: Normal appearance. He is well-developed. He is not ill-appearing.  HENT:     Head: Normocephalic and atraumatic.     Right Ear: External ear normal.     Left Ear: External ear normal.     Nose: No congestion.  Eyes:     Extraocular Movements: Extraocular movements intact.     Conjunctiva/sclera: Conjunctivae normal.     Pupils: Pupils are equal, round, and reactive to light.  Cardiovascular:     Rate and Rhythm: Normal rate.  Pulses: Normal pulses.     Heart sounds: Normal heart sounds.  Pulmonary:     Effort: Pulmonary effort is normal. No respiratory distress.  Abdominal:     General: There is no distension.     Palpations: Abdomen is soft.  Musculoskeletal:        General: Tenderness present. No signs of injury.     Cervical back: Normal range of motion and neck supple. No rigidity.     Right lower leg: No edema.     Left lower leg: No edema.     Comments: Patient has good distal strength without clonus.  Skin:    General: Skin is warm and dry.     Findings: No erythema or rash.  Neurological:     General: No focal deficit present.     Mental Status: He is alert and oriented to person, place, and time.     Cranial Nerves: No cranial nerve deficit.     Sensory: No  sensory deficit.     Motor: No weakness or abnormal muscle tone.     Coordination: Coordination normal.     Gait: Gait abnormal.  Psychiatric:        Mood and Affect: Mood normal.        Behavior: Behavior normal.     Ortho Exam  Imaging: No results found.  Past Medical/Family/Surgical/Social History: Medications & Allergies reviewed per EMR, new medications updated. Patient Active Problem List   Diagnosis Date Noted   Loop recorder: Biotronic Biomonitor II loop 07/28/2021 07/28/2021   Unsteady gait 08/05/2020   Anxiety disorder 02/02/2018   Right hip pain 10/02/2017   Recurrent major depression in remission ([place]) 01/27/2017   Chronic kidney disease, stage 3a ([place]) 07/17/2015   Personal history of colonic adenomas 03/26/2013   Obesity 10/04/2012   OBSTRUCTIVE SLEEP APNEA 10/30/2008   HYPERLIPIDEMIA-MIXED 07/01/2008   HYPERTENSION, BENIGN 07/01/2008   CAD, NATIVE VESSEL 07/01/2008   FATIGUE / MALAISE 07/01/2008   EDEMA 07/01/2008   Past Medical History:  Diagnosis Date   Acute renal failure (HCC)    secondary to nephrotic syndrome post cath, resolved with steroids   Anxiety    CAD (coronary artery disease) 04/2006   LIMA to LAD, sequential SVG to first, second, and third OM's, sequential SVG to mid RCA and PDA    Depression    Diabetes mellitus without complication (HCC)    ED (erectile dysfunction)    HTN (hypertension)    Hx of adenomatous colonic polyps    Hyperlipidemia    Loop recorder: Biotronic Biomonitor II loop 07/28/2021 07/28/2021   Obesity    Family History  Problem Relation Age of Onset   Heart attack Mother    Clotting disorder Father        died of renal disease   Heart disease Sister    Cancer Sister    Colon cancer Neg Hx    Past Surgical History:  Procedure Laterality Date   COLONOSCOPY W/ BIOPSIES AND POLYPECTOMY     CORONARY ARTERY BYPASS GRAFT  2008   VESSELS X6   LOOP RECORDER IMPLANT     UMBILICAL HERNIA REPAIR     Social History    Occupational History   Occupation: Surveyor, mining  Tobacco Use   Smoking status: Never   Smokeless tobacco: Never  Vaping Use   Vaping Use: Never used  Substance and Sexual Activity   Alcohol use: No    Alcohol/week: 1.0 standard drink of alcohol  Types: 1 Glasses of wine per week   Drug use: No   Sexual activity: Not on file

## 2022-04-19 NOTE — Progress Notes (Signed)
Functional Pain Scale - descriptive words and definitions  Distracting (5)    Aware of pain/able to complete some ADL's but limited by pain/sleep is affected and active distractions are only slightly useful. Moderate range order  Average Pain  ranges from 5-6   +Driver, -BT, -Dye Allergies.  Right hip pain. Can hardly stand when first getting out of bed. Standing too long makes hip pain worse

## 2022-04-20 ENCOUNTER — Encounter: Payer: Self-pay | Admitting: Cardiology

## 2022-05-18 MED ORDER — TRIAMCINOLONE ACETONIDE 40 MG/ML IJ SUSP
40.0000 mg | INTRAMUSCULAR | Status: AC | PRN
  Administered 2022-04-19: 40 mg via INTRA_ARTICULAR

## 2022-05-18 MED ORDER — BUPIVACAINE HCL 0.25 % IJ SOLN
4.0000 mL | INTRAMUSCULAR | Status: AC | PRN
  Administered 2022-04-19: 4 mL via INTRA_ARTICULAR

## 2022-05-19 DIAGNOSIS — R293 Abnormal posture: Secondary | ICD-10-CM | POA: Diagnosis not present

## 2022-05-19 DIAGNOSIS — N1831 Chronic kidney disease, stage 3a: Secondary | ICD-10-CM | POA: Diagnosis not present

## 2022-05-19 DIAGNOSIS — I129 Hypertensive chronic kidney disease with stage 1 through stage 4 chronic kidney disease, or unspecified chronic kidney disease: Secondary | ICD-10-CM | POA: Diagnosis not present

## 2022-05-19 DIAGNOSIS — I25810 Atherosclerosis of coronary artery bypass graft(s) without angina pectoris: Secondary | ICD-10-CM | POA: Diagnosis not present

## 2022-05-19 DIAGNOSIS — E1129 Type 2 diabetes mellitus with other diabetic kidney complication: Secondary | ICD-10-CM | POA: Diagnosis not present

## 2022-05-20 ENCOUNTER — Ambulatory Visit: Payer: Self-pay | Admitting: Cardiology

## 2022-05-20 ENCOUNTER — Telehealth: Payer: Self-pay

## 2022-05-20 DIAGNOSIS — Z4509 Encounter for adjustment and management of other cardiac device: Secondary | ICD-10-CM | POA: Diagnosis not present

## 2022-05-20 DIAGNOSIS — Z95818 Presence of other cardiac implants and grafts: Secondary | ICD-10-CM | POA: Diagnosis not present

## 2022-05-20 DIAGNOSIS — R55 Syncope and collapse: Secondary | ICD-10-CM | POA: Diagnosis not present

## 2022-05-20 DIAGNOSIS — K529 Noninfective gastroenteritis and colitis, unspecified: Secondary | ICD-10-CM

## 2022-05-20 NOTE — Telephone Encounter (Signed)
Patient having frequent episodes of diarrhea, bloating, I suspect more than the beta-blocker could be related to metformin.  Advised him to hold metformin for a week and see how he does.  He also has occasional episodes of constipation, he will start with fiber supplement Metamucil on a daily basis as well.  I will follow-up on this, if symptoms persist then we could consider discontinuing beta-blocker and switching him over to verapamil.  I have been monitoring his loop recorder on a very frequent basis due to paroxysmal atrial fibrillation, he has not had any sustained atrial fibrillation.  Discussed this with the patient.

## 2022-05-20 NOTE — Telephone Encounter (Signed)
Patient said he can receive messages on MyChart if you need to give him anymore information.

## 2022-05-20 NOTE — Progress Notes (Signed)
Patient having frequent episodes of diarrhea, bloating, I suspect more than the beta-blocker could be related to metformin.  Advised him to hold metformin for a week and see how he does.  He also has occasional episodes of constipation, he will start with fiber supplement Metamucil on a daily basis as well.  I will follow-up on this, if symptoms persist then we could consider discontinuing beta-blocker and switching him over to verapamil.  I have been monitoring his loop recorder on a very frequent basis due to paroxysmal atrial fibrillation, he has not had any sustained atrial fibrillation.  Discussed this with the patient.

## 2022-05-20 NOTE — Telephone Encounter (Signed)
Patient came to the office today and mention that he is having lack of sleep, depression, and diarrhea for the past few months. Patient mention he thinks it can be due to his beta blockers. Please advise

## 2022-05-20 NOTE — Telephone Encounter (Signed)
Yes, he can stop that. I will send something else. I will do the med reconcilaition and will talk to him

## 2022-05-24 DIAGNOSIS — R293 Abnormal posture: Secondary | ICD-10-CM | POA: Diagnosis not present

## 2022-05-26 DIAGNOSIS — R293 Abnormal posture: Secondary | ICD-10-CM | POA: Diagnosis not present

## 2022-06-02 DIAGNOSIS — R293 Abnormal posture: Secondary | ICD-10-CM | POA: Diagnosis not present

## 2022-06-03 DIAGNOSIS — R293 Abnormal posture: Secondary | ICD-10-CM | POA: Diagnosis not present

## 2022-06-07 DIAGNOSIS — R293 Abnormal posture: Secondary | ICD-10-CM | POA: Diagnosis not present

## 2022-06-13 ENCOUNTER — Other Ambulatory Visit: Payer: Self-pay | Admitting: Cardiology

## 2022-06-13 DIAGNOSIS — R293 Abnormal posture: Secondary | ICD-10-CM | POA: Diagnosis not present

## 2022-06-13 DIAGNOSIS — I10 Essential (primary) hypertension: Secondary | ICD-10-CM

## 2022-06-15 DIAGNOSIS — R293 Abnormal posture: Secondary | ICD-10-CM | POA: Diagnosis not present

## 2022-06-17 DIAGNOSIS — R293 Abnormal posture: Secondary | ICD-10-CM | POA: Diagnosis not present

## 2022-06-20 DIAGNOSIS — Z95818 Presence of other cardiac implants and grafts: Secondary | ICD-10-CM | POA: Diagnosis not present

## 2022-06-20 DIAGNOSIS — R293 Abnormal posture: Secondary | ICD-10-CM | POA: Diagnosis not present

## 2022-06-20 DIAGNOSIS — Z4509 Encounter for adjustment and management of other cardiac device: Secondary | ICD-10-CM | POA: Diagnosis not present

## 2022-06-20 DIAGNOSIS — R55 Syncope and collapse: Secondary | ICD-10-CM | POA: Diagnosis not present

## 2022-06-22 DIAGNOSIS — R293 Abnormal posture: Secondary | ICD-10-CM | POA: Diagnosis not present

## 2022-06-29 ENCOUNTER — Encounter: Payer: Self-pay | Admitting: Cardiology

## 2022-06-29 ENCOUNTER — Ambulatory Visit: Payer: HMO | Admitting: Cardiology

## 2022-06-29 VITALS — BP 125/69 | HR 79 | Resp 16 | Ht 69.0 in | Wt 180.2 lb

## 2022-06-29 DIAGNOSIS — I25118 Atherosclerotic heart disease of native coronary artery with other forms of angina pectoris: Secondary | ICD-10-CM

## 2022-06-29 DIAGNOSIS — Z95818 Presence of other cardiac implants and grafts: Secondary | ICD-10-CM

## 2022-06-29 DIAGNOSIS — I10 Essential (primary) hypertension: Secondary | ICD-10-CM

## 2022-06-29 DIAGNOSIS — D751 Secondary polycythemia: Secondary | ICD-10-CM | POA: Diagnosis not present

## 2022-06-29 DIAGNOSIS — R293 Abnormal posture: Secondary | ICD-10-CM | POA: Diagnosis not present

## 2022-06-29 DIAGNOSIS — I951 Orthostatic hypotension: Secondary | ICD-10-CM | POA: Diagnosis not present

## 2022-06-29 NOTE — Progress Notes (Signed)
Primary Physician/Referring:  Ginger Organ., MD  Patient ID: [name], male    DOB: 1934-11-03, 88 y.o.   MRN: [id number]  Chief Complaint  Patient presents with   Coronary Artery Disease   Diabetes        Follow-up   HPI:    [name]  is a 88 y.o. male with history of CAD s/p CABG  in 2008 with LIMA to LAD, sequential saphenous vein graft to first second and third obtuse marginals, and sequential saphenous vein graft to the RCA and right PDA. He has history of hypertension, hyperlipidemia, diabetes mellitus, GERD and esophageal and gastric ulcers diagnosed in Jan 2023 for dysphagia.  He has not had any bleeding diathesis and doing well.  Due to recurrent syncope underwent loop recorder plantation on 07/28/2021.  He complains of feeling dizzy, feels fuzzy in his head.  He has not had any loss of consciousness.  He remains active, except for degenerative joint disease no other specific symptoms, denies chest pain or dyspnea.  Past Medical History:  Diagnosis Date   Acute renal failure (HCC)    secondary to nephrotic syndrome post cath, resolved with steroids   Anxiety    CAD (coronary artery disease) 04/2006   LIMA to LAD, sequential SVG to first, second, and third OM's, sequential SVG to mid RCA and PDA    Depression    Diabetes mellitus without complication (HCC)    ED (erectile dysfunction)    HTN (hypertension)    Hx of adenomatous colonic polyps    Hyperlipidemia    Loop recorder: Biotronic Biomonitor II loop 07/28/2021 07/28/2021   Obesity    Past Surgical History:  Procedure Laterality Date   COLONOSCOPY W/ BIOPSIES AND POLYPECTOMY     CORONARY ARTERY BYPASS GRAFT  2008   VESSELS X6   LOOP RECORDER IMPLANT     UMBILICAL HERNIA REPAIR     Family History  Problem Relation Age of Onset   Heart attack Mother    Clotting disorder Father        died of renal disease   Heart disease Sister    Cancer Sister    Colon cancer Neg Hx     Social  History   Tobacco Use   Smoking status: Never   Smokeless tobacco: Never  Substance Use Topics   Alcohol use: No    Alcohol/week: 1.0 standard drink of alcohol    Types: 1 Glasses of wine per week   Marital Status: Widowed   ROS  Review of Systems  Cardiovascular:  Negative for chest pain, dyspnea on exertion and leg swelling.  Neurological:  Positive for dizziness.    Objective  Blood pressure 125/69, pulse 79, resp. rate 16, height '5\' 9"'$  (1.753 m), weight 180 lb 3.2 oz (81.7 kg), SpO2 98 %.     06/29/2022   11:41 AM 11/16/2021    9:34 AM 09/28/2021   10:30 PM  Vitals with BMI  Height '5\' 9"'$  '5\' 9"'$    Weight 180 lbs 3 oz 170 lbs   BMI [id number]   Systolic 0000000 [id number] 0000000  Diastolic 69 80 87  Pulse 79 75 57    Orthostatic VS for the past 72 hrs (Last 3 readings):  Orthostatic BP Patient Position BP Location Cuff Size Orthostatic Pulse  06/29/22 1234 131/69 Standing Left Arm Normal 75  06/29/22 1233 143/73 Sitting Left Arm Normal 74  06/29/22 1232 148/89 Supine Left Arm Normal 73  Physical Exam Neck:     Vascular: No carotid bruit or JVD.  Cardiovascular:     Rate and Rhythm: Normal rate and regular rhythm. Occasional Extrasystoles are present.    Pulses: Intact distal pulses.     Heart sounds: Normal heart sounds. No murmur heard.    No gallop.  Pulmonary:     Effort: Pulmonary effort is normal.     Breath sounds: Normal breath sounds.  Abdominal:     General: Bowel sounds are normal.     Palpations: Abdomen is soft.  Musculoskeletal:     Right lower leg: Edema (2+ ankle only edema) present.     Left lower leg: Edema (2+ ankle only edema) present.    Laboratory examination:   External labs:   Labs 10/02/2021:  Hb 17.0/HCT 51.9, platelets 201.  Normal indicis.  A1c 9.9%.  Urine analysis protein negative.  3+ glucose.  Labs 10/20/2021:  Serum glucose 183 mg, BUN 25, creatinine 1.3, EGFR 52.5 mL, potassium 5.3, LFTs normal.  Total cholesterol 118,  triglycerides 80, HDL 36, LDL 66.  A1c 7.8%.  TSH normal at 0.64. Allergies   Allergies  Allergen Reactions   Ketorolac Tromethamine Other (See Comments)    nephrotic syndrome     Final Medications at End of Visit     Current Outpatient Medications:    acebutolol (SECTRAL) 200 MG capsule, Take 1 capsule by mouth twice daily, Disp: 180 capsule, Rfl: 1   aspirin EC 81 MG tablet, Take 81 mg by mouth daily., Disp: , Rfl:    atorvastatin (LIPITOR) 40 MG tablet, Take 40 mg by mouth daily., Disp: , Rfl:    CIALIS 5 MG tablet, Take 1 tablet by mouth every evening., Disp: , Rfl: 5   furosemide (LASIX) 20 MG tablet, Take 1 tablet by mouth daily as needed. Take in morning, Disp: , Rfl:    JARDIANCE 25 MG TABS tablet, Take 1 tablet by mouth every evening., Disp: , Rfl: 10   metFORMIN (GLUCOPHAGE) 1000 MG tablet, 1 pill twice a day, Disp: , Rfl:    MYRBETRIQ 50 MG TB24 tablet, , Disp: , Rfl:    olmesartan-hydrochlorothiazide (BENICAR HCT) 40-25 MG tablet, TAKE 1 TABLET BY MOUTH ONCE DAILY IN THE MORNING, Disp: 90 tablet, Rfl: 1   Saxagliptin-Metformin (KOMBIGLYZE XR) 08-998 MG TB24, Take 1 tablet by mouth daily., Disp: , Rfl:    Semaglutide,0.25 or 0.'5MG'$ /DOS, (OZEMPIC, 0.25 OR 0.5 MG/DOSE,) 2 MG/3ML SOPN, Inject 0.5 mg into the skin once a week., Disp: , Rfl:    tamsulosin (FLOMAX) 0.4 MG CAPS capsule, Take 1 capsule by mouth daily., Disp: , Rfl:    vitamin E 100 UNIT capsule, Take 100 Units by mouth daily., Disp: , Rfl:    ZETIA 10 MG tablet, Take 1 tablet by mouth every morning., Disp: , Rfl:   Radiology:   No results found.  Cardiac Studies:   Bilateral carotid artery duplex 03/04/2021 ([facility]): No evidence of significant carotid artery bifurcation stenosis  Echocardiogram 03/04/2021 ([facility]): 1. Left ventricle: The left ventricular cavity size is small. There is mild to moderate concentric hypertrophy noted. Systolic function is normal. The ejection fraction is 55% (+/-5%), estimated  visually. No segmental wall motion abnormalities. The diastolic parameters in the setting of A-fib are non-diagnostic; however, the tissue doppler and 2D findings suggest elevated left atrial pressure.  2. Left atrium: The left atrium is mildly dilated as determined by left atrial volume index.   PCV MYOCARDIAL PERFUSION WITH LEXISCAN 07/05/2021  Nondiagnostic ECG stress. The heart rate response was consistent with Lexiscan. Occasional PACs and PVCs. There is a reversible moderate defect in the inferior and apical regions. Overall LV systolic function is abnormal with global hypokinesis and inferior akinesis. Stress LV EF: 28%. Compared to the report of study done on 09/04/2017, inferior wall defect noted as scar without ischemia, LVEF was mildly decreased at 50%.  High risk study due to marked decrease in LVEF.  Loop recorder: Biotronic Biomonitor II loop 07/28/2021 for syncope    Remote loop recorder check 06/20/2022  Predominant rhythm is normal sinus rhythm.  Atrial monitoring episodes = PACs, brief AT/AF/SVT.  No symptoms reported.  No heart block.  EKG:   EKG 06/29/2022: Sinus rhythm first-degree AV block at rate of 70 bpm, inferior infarct old, nonspecific T abnormality.  Compared to 05/27/2021, frequent PACs not present.  Assessment     ICD-10-CM   1. Atherosclerosis of native coronary artery of native heart with stable angina pectoris ([facility])  I25.118 EKG 12-Lead    2. Polycythemia  D75.1     3. Primary hypertension  I10     4. Orthostatic hypotension  I95.1     5. Loop recorder: Biotronic Biomonitor II loop 07/28/2021  Z95.818       Medications Discontinued During This Encounter  Medication Reason   omeprazole (PRILOSEC) 40 MG capsule     No orders of the defined types were placed in this encounter.  Recommendations:   [name] is a 88 y.o. male with history of CAD s/p CABG  in 2008 with LIMA to LAD, sequential saphenous vein graft to first second and third obtuse  marginals, and sequential saphenous vein graft to the RCA and right PDA. He has history of hypertension, hyperlipidemia, diabetes mellitus, GERD and esophageal and gastric ulcers diagnosed in Jan 2023 for dysphagia.  He has not had any bleeding diathesis and doing well.  Due to recurrent syncope underwent loop recorder plantation on 07/28/2021.  He complains of feeling dizzy, feels fuzzy in his head.  He has not had any loss of consciousness.  1. Atherosclerosis of native coronary artery of native heart with stable angina pectoris [facility]) This is annual visit, patient fortunately has done well, he has not had any chest pain, dyspnea.  Clinical examination reveals normal heart sounds, no murmur and vascular examination is normal.  No change in the EKG.  2. Polycythemia I am beginning to wonder whether mild orthostatic hypotension, hypoglycemia, polycythemia leading to his "brain fog".  I reviewed his CBC dating back to 2022 when the hemoglobin was around 13.6-14, slowly over time hemoglobin has risen to 17 g%.  I will request Dr. Carmie Kanner to see whether he needs to be evaluated further for polycythemia and in fact is causing elevated serum osmolality as well.  He may need further evaluation of the same.  3. Primary hypertension Blood pressure minimally elevated however he has mild orthostasis, hence treating his standing blood pressure would be most appropriate, hence I did not make any changes to his medications.  4. Orthostatic hypotension Mild orthostatic hypotension noted today.  5. Loop recorder: Biotronic Biomonitor II loop 07/28/2021 Loop transmission reviewed, he has had frequent PACs, brief SVT/atrial tachycardia episodes, extremely brief atrial fibrillation episodes and no indication for anticoagulation.  Will continue remote monitoring.  He has not had any syncope for which the loop was placed.  Office visit in 6 months.  External labs reviewed, stable renal function,  well-controlled lipids.  Adrian Prows, MD, [facility] 06/29/2022, 1:04 PM Office: [phone] Fax: [phone] Pager: [phone]

## 2022-06-30 DIAGNOSIS — R293 Abnormal posture: Secondary | ICD-10-CM | POA: Diagnosis not present

## 2022-07-04 DIAGNOSIS — R293 Abnormal posture: Secondary | ICD-10-CM | POA: Diagnosis not present

## 2022-07-05 DIAGNOSIS — I10 Essential (primary) hypertension: Secondary | ICD-10-CM | POA: Diagnosis not present

## 2022-07-05 DIAGNOSIS — E1129 Type 2 diabetes mellitus with other diabetic kidney complication: Secondary | ICD-10-CM | POA: Diagnosis not present

## 2022-07-05 DIAGNOSIS — G4733 Obstructive sleep apnea (adult) (pediatric): Secondary | ICD-10-CM | POA: Diagnosis not present

## 2022-07-05 DIAGNOSIS — D751 Secondary polycythemia: Secondary | ICD-10-CM | POA: Diagnosis not present

## 2022-07-05 DIAGNOSIS — R42 Dizziness and giddiness: Secondary | ICD-10-CM | POA: Diagnosis not present

## 2022-07-06 DIAGNOSIS — R293 Abnormal posture: Secondary | ICD-10-CM | POA: Diagnosis not present

## 2022-07-08 ENCOUNTER — Encounter: Payer: Self-pay | Admitting: Cardiology

## 2022-07-08 NOTE — Progress Notes (Signed)
Labs 07/05/2022:  Serum glucose: 30 mg, BUN 39, creatinine 1.7, EGFR 38 mL, potassium 4.6.  LFTs normal.  Hb 16.4/HCT 47.5, platelets 202, normal indicis.  A1c 9.9%.

## 2022-07-11 DIAGNOSIS — R293 Abnormal posture: Secondary | ICD-10-CM | POA: Diagnosis not present

## 2022-07-15 DIAGNOSIS — R293 Abnormal posture: Secondary | ICD-10-CM | POA: Diagnosis not present

## 2022-07-15 DIAGNOSIS — M25652 Stiffness of left hip, not elsewhere classified: Secondary | ICD-10-CM | POA: Diagnosis not present

## 2022-07-15 DIAGNOSIS — M25651 Stiffness of right hip, not elsewhere classified: Secondary | ICD-10-CM | POA: Diagnosis not present

## 2022-07-21 DIAGNOSIS — Z95818 Presence of other cardiac implants and grafts: Secondary | ICD-10-CM | POA: Diagnosis not present

## 2022-07-21 DIAGNOSIS — Z4509 Encounter for adjustment and management of other cardiac device: Secondary | ICD-10-CM | POA: Diagnosis not present

## 2022-07-21 DIAGNOSIS — R55 Syncope and collapse: Secondary | ICD-10-CM | POA: Diagnosis not present

## 2022-07-21 DIAGNOSIS — R293 Abnormal posture: Secondary | ICD-10-CM | POA: Diagnosis not present

## 2022-07-22 DIAGNOSIS — R293 Abnormal posture: Secondary | ICD-10-CM | POA: Diagnosis not present

## 2022-07-22 DIAGNOSIS — R42 Dizziness and giddiness: Secondary | ICD-10-CM | POA: Diagnosis not present

## 2022-07-22 DIAGNOSIS — F3341 Major depressive disorder, recurrent, in partial remission: Secondary | ICD-10-CM | POA: Diagnosis not present

## 2022-07-22 DIAGNOSIS — D751 Secondary polycythemia: Secondary | ICD-10-CM | POA: Diagnosis not present

## 2022-07-22 DIAGNOSIS — G4733 Obstructive sleep apnea (adult) (pediatric): Secondary | ICD-10-CM | POA: Diagnosis not present

## 2022-07-22 DIAGNOSIS — I25810 Atherosclerosis of coronary artery bypass graft(s) without angina pectoris: Secondary | ICD-10-CM | POA: Diagnosis not present

## 2022-07-22 DIAGNOSIS — E782 Mixed hyperlipidemia: Secondary | ICD-10-CM | POA: Diagnosis not present

## 2022-07-22 DIAGNOSIS — E1129 Type 2 diabetes mellitus with other diabetic kidney complication: Secondary | ICD-10-CM | POA: Diagnosis not present

## 2022-07-22 DIAGNOSIS — I700 Atherosclerosis of aorta: Secondary | ICD-10-CM | POA: Diagnosis not present

## 2022-07-22 DIAGNOSIS — I10 Essential (primary) hypertension: Secondary | ICD-10-CM | POA: Diagnosis not present

## 2022-07-22 DIAGNOSIS — N1831 Chronic kidney disease, stage 3a: Secondary | ICD-10-CM | POA: Diagnosis not present

## 2022-07-22 DIAGNOSIS — F418 Other specified anxiety disorders: Secondary | ICD-10-CM | POA: Diagnosis not present

## 2022-07-22 DIAGNOSIS — I129 Hypertensive chronic kidney disease with stage 1 through stage 4 chronic kidney disease, or unspecified chronic kidney disease: Secondary | ICD-10-CM | POA: Diagnosis not present

## 2022-07-28 DIAGNOSIS — R293 Abnormal posture: Secondary | ICD-10-CM | POA: Diagnosis not present

## 2022-07-29 DIAGNOSIS — M25652 Stiffness of left hip, not elsewhere classified: Secondary | ICD-10-CM | POA: Diagnosis not present

## 2022-07-29 DIAGNOSIS — M25651 Stiffness of right hip, not elsewhere classified: Secondary | ICD-10-CM | POA: Diagnosis not present

## 2022-07-29 DIAGNOSIS — R293 Abnormal posture: Secondary | ICD-10-CM | POA: Diagnosis not present

## 2022-08-01 DIAGNOSIS — R293 Abnormal posture: Secondary | ICD-10-CM | POA: Diagnosis not present

## 2022-08-04 DIAGNOSIS — R293 Abnormal posture: Secondary | ICD-10-CM | POA: Diagnosis not present

## 2022-08-14 IMAGING — CT CT ANGIO CHEST-ABD-PELV FOR DISSECTION W/ AND WO/W CM
2 of 7 series · 13 of 46 positions shown, 15 images · IV contrast (APPLIED)
Comparison: CT abdomen pelvis dated 03/19/2020.

CLINICAL DATA: Acute aortic syndrome suspected.

EXAM:
CT ANGIOGRAPHY CHEST, ABDOMEN AND PELVIS
TECHNIQUE: Non-contrast CT of the chest was initially obtained.

[Series 6: axial arterial · axial · arterial · 0.74mm/px · z∈[+754,+1306]mm · 10 of 312 slices shown, 12 images]
[im 18/312  soft-tissue]
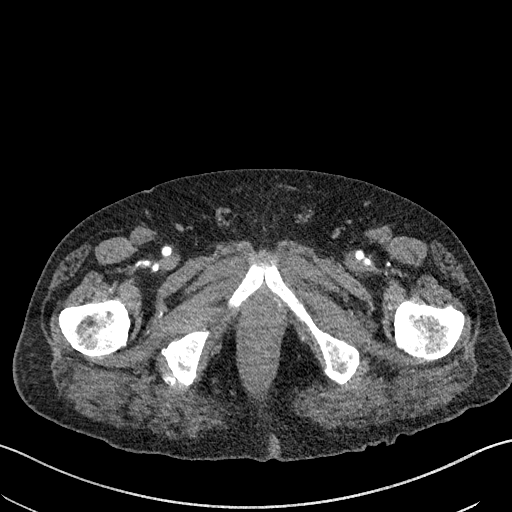
[im 18/312  bone]
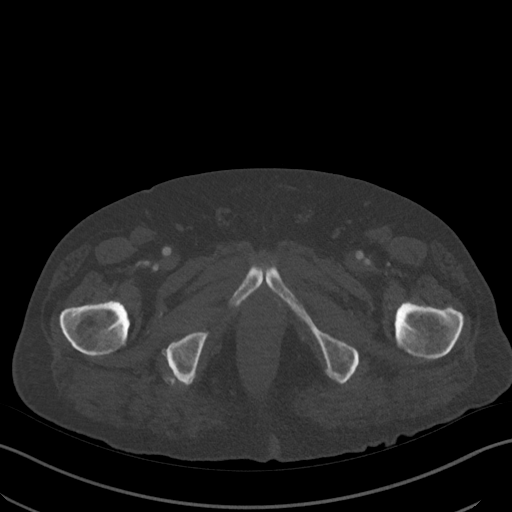
[im 52/312  soft-tissue]
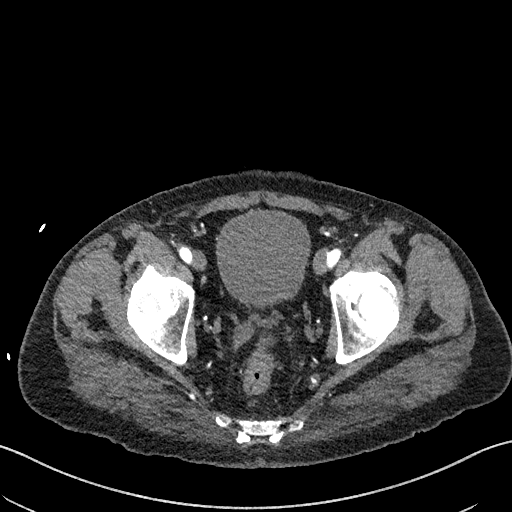
[im 87/312  soft-tissue]
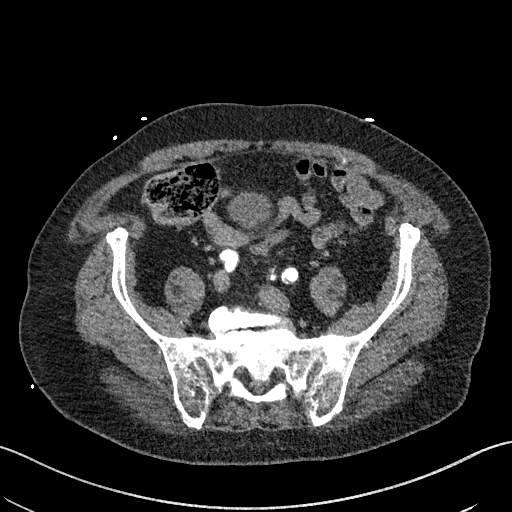
[im 104/312  soft-tissue]
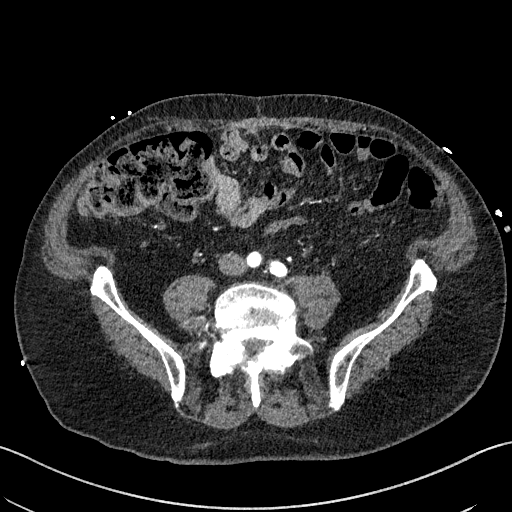
[im 139/312  soft-tissue]
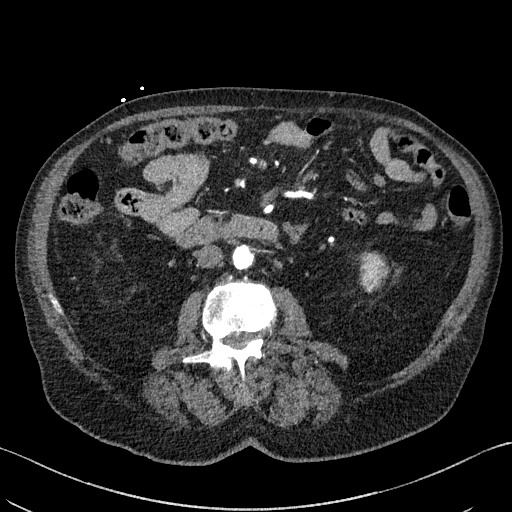
[im 173/312  soft-tissue]
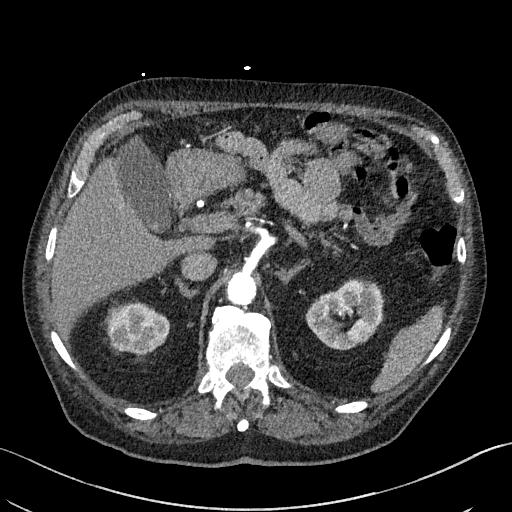
[im 208/312  soft-tissue]
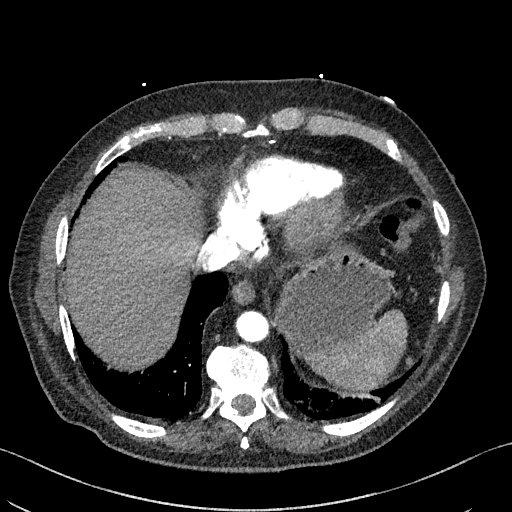
[im 225/312  soft-tissue]
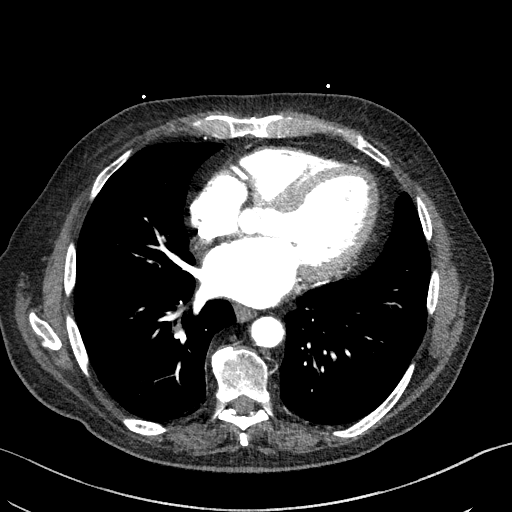
[im 260/312  soft-tissue]
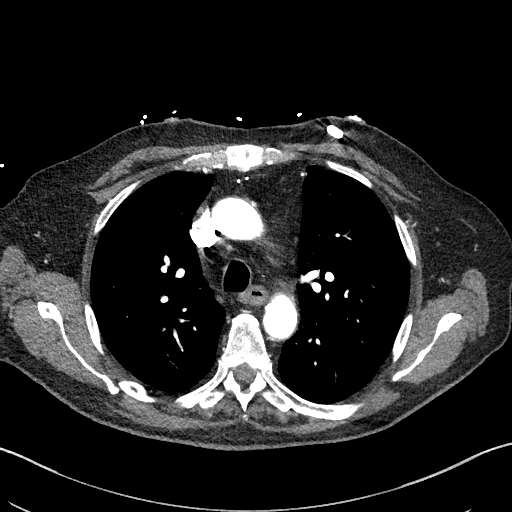
[im 260/312  bone]
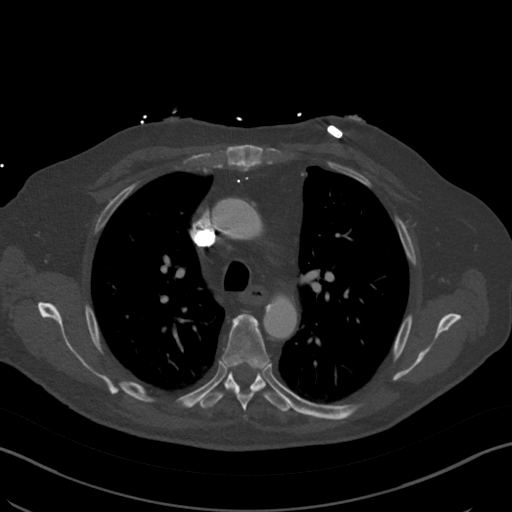
[im 294/312  soft-tissue]
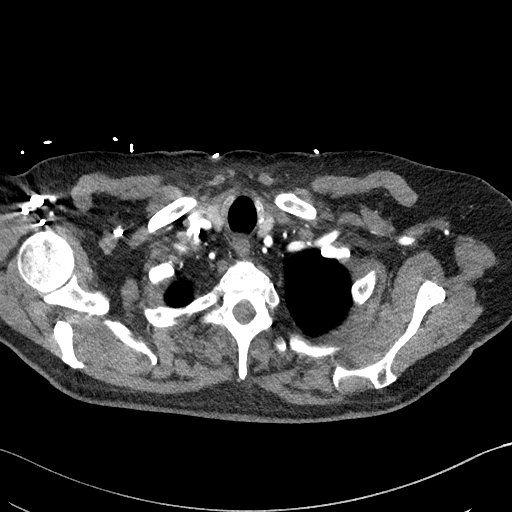

[Series 9: cor soft · coronal · 0.74mm/px · 3 of 152 slices shown]
[im 38/152  soft-tissue]
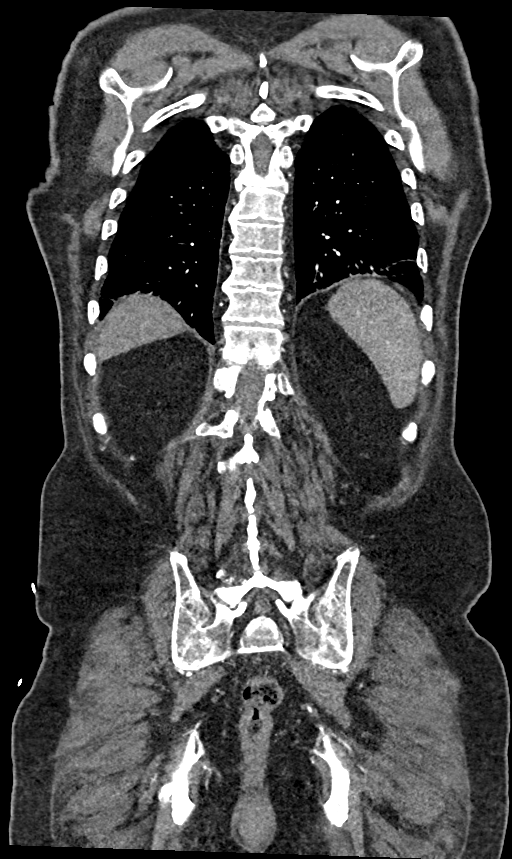
[im 76/152  soft-tissue]
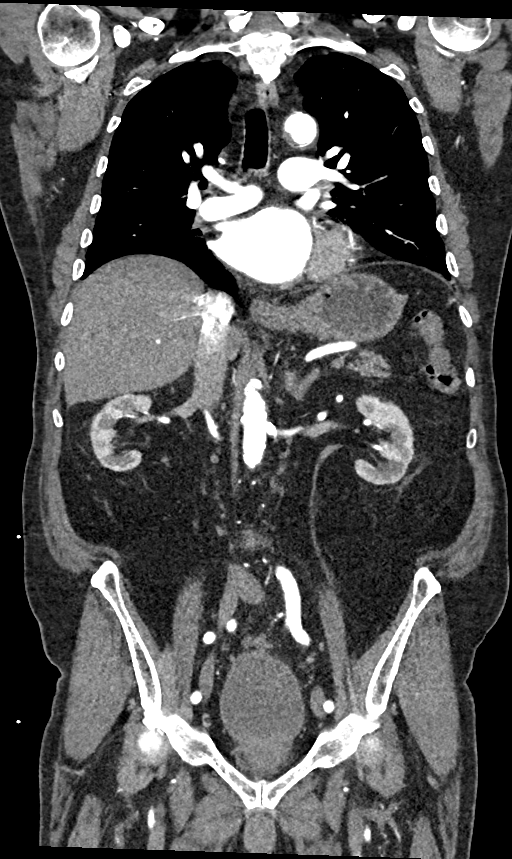
[im 114/152  soft-tissue]
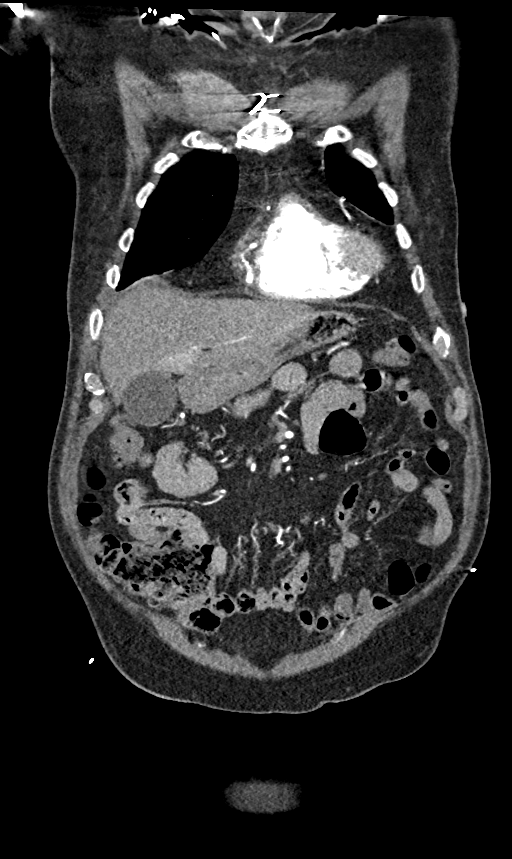

[13 of 46 positions shown; findings below may reference images not displayed]

Multidetector CT imaging through the chest, abdomen and pelvis was
performed using the standard protocol during bolus administration of
intravenous contrast. Multiplanar reconstructed images and MIPs were
obtained and reviewed to evaluate the vascular anatomy.

RADIATION DOSE REDUCTION: This exam was performed according to the
departmental dose-optimization program which includes automated
exposure control, adjustment of the mA and/or kV according to
patient size and/or use of iterative reconstruction technique.

CONTRAST:  100mL OMNIPAQUE IOHEXOL 350 MG/ML SOLN
FINDINGS: CTA CHEST FINDINGS

Cardiovascular: There is no cardiomegaly or pericardial effusion. 3
vessel coronary vascular calcification and postsurgical changes of
CABG. Mild atherosclerotic calcification of the thoracic aorta. No
aneurysmal dilatation or dissection. The origins of the great
vessels of the aortic arch appear patent. No pulmonary artery
embolus identified.

Mediastinum/Nodes: No hilar or mediastinal adenopathy. The esophagus
and thyroid gland are grossly unremarkable. No mediastinal fluid
collection.

Lungs/Pleura: There are bibasilar dependent atelectasis. There is a
3 mm right upper lobe subpleural nodule (56/8). No focal
consolidation, pleural effusion or pneumothorax. The central airways
are patent.

Musculoskeletal: Degenerative changes of the spine. Median
sternotomy wires. No acute osseous pathology. Recording device for
pacer in the subcutaneous soft tissues of the left anterior chest
wall.

Review of the MIP images confirms the above findings.

CTA ABDOMEN AND PELVIS FINDINGS

VASCULAR

Aorta: Moderate atherosclerotic calcification. No aneurysmal
dilatation or dissection. No periaortic fluid collection.

Celiac: Patent without evidence of aneurysm, dissection, vasculitis
or significant stenosis.

SMA: Atherosclerotic calcification of the SMA. The SMA remains
patent.

Renals: Atherosclerotic calcification of the origins of the renal
arteries. The renal arteries are patent.

IMA: Patent without evidence of aneurysm, dissection, vasculitis or
significant stenosis.

Inflow: Atherosclerotic calcification of the iliac arteries. The
iliac arteries are patent. No aneurysmal dilatation or dissection.

Veins: No obvious venous abnormality within the limitations of this
arterial phase study.

Review of the MIP images confirms the above findings.

NON-VASCULAR

No intra-abdominal free air or free fluid.

Hepatobiliary: Fatty liver. No intrahepatic biliary dilatation.
Multiple gallstones. Mild thickened appearance of the gallbladder
wall. Correlation with ultrasound is recommended to exclude acute
cholecystitis.

Pancreas: Unremarkable. No pancreatic ductal dilatation or
surrounding inflammatory changes.

Spleen: Normal in size without focal abnormality.

Adrenals/Urinary Tract: The adrenal glands are unremarkable. There
is a 2.5 cm right renal upper pole cyst. Additional subcentimeter
hypodense lesion from the inferior pole of the right kidney is too
small to characterize. There is no hydronephrosis on either side.
The visualized ureters and urinary bladder appear unremarkable.

Stomach/Bowel: There is no bowel obstruction or active inflammation.
The appendix is normal.

Lymphatic: No adenopathy.

Reproductive: Enlarged prostate gland measuring 6 cm in transverse
axial diameter. The seminal vesicles are symmetric.

Other: None

Musculoskeletal: Degenerative changes of the spine. No acute osseous
pathology.

Review of the MIP images confirms the above findings.
IMPRESSION: 1. No aortic aneurysm or dissection.
2. No pulmonary artery embolus identified.
3. Cholelithiasis with mild thickened appearance of the gallbladder
wall. Correlation with ultrasound is recommended to exclude acute
cholecystitis.
4. Fatty liver.
5. There is a 3 mm right upper lobe subpleural nodule. No follow-up
needed if patient is low-risk.This recommendation follows the
consensus statement: Guidelines for Management of Incidental
Pulmonary Nodules Detected on CT Images: From the [HOSPITAL]
6. Aortic Atherosclerosis (34YZJ-UGO.O).

## 2022-08-14 IMAGING — US US ABDOMEN LIMITED
1 series · 14 of 25 positions shown · non-contrast
Comparison: CT dated 09/28/2021.

CLINICAL DATA: Abdominal pain.

EXAM:
ULTRASOUND ABDOMEN LIMITED RIGHT UPPER QUADRANT

[Series 1: us abdomen limited ruq (liver/gb) · 63 acquisitions, 14 frames shown]
[im 1/63]
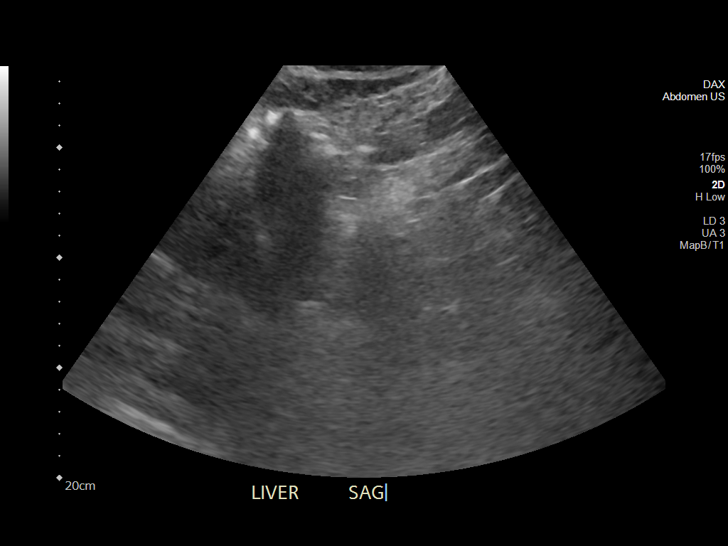
[im 6/63]
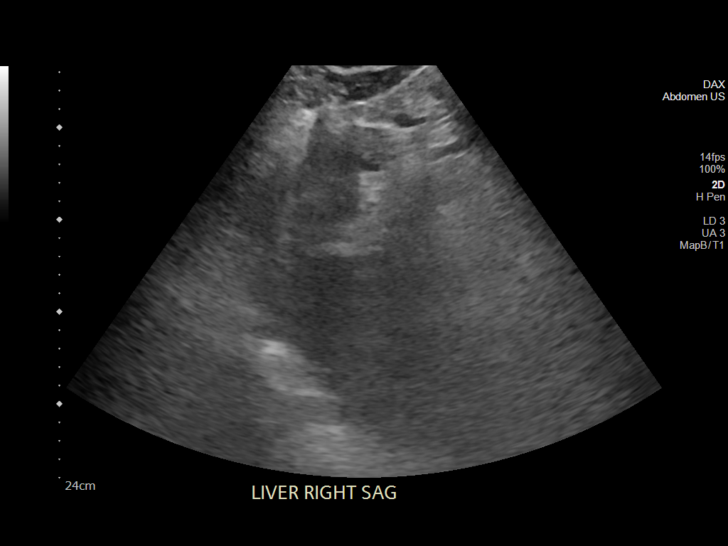
[im 11/63]
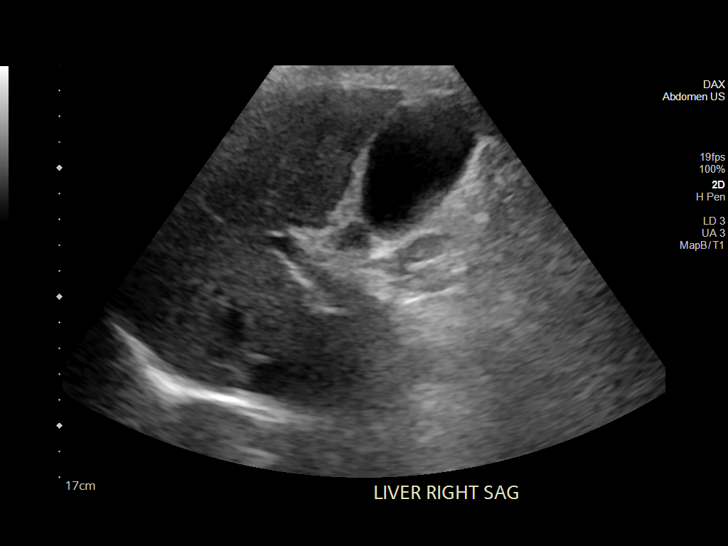
[im 16/63]
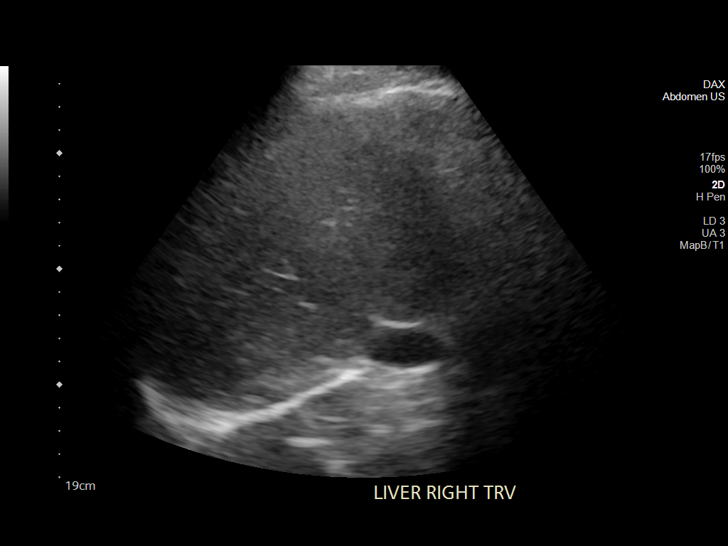
[im 21/63]
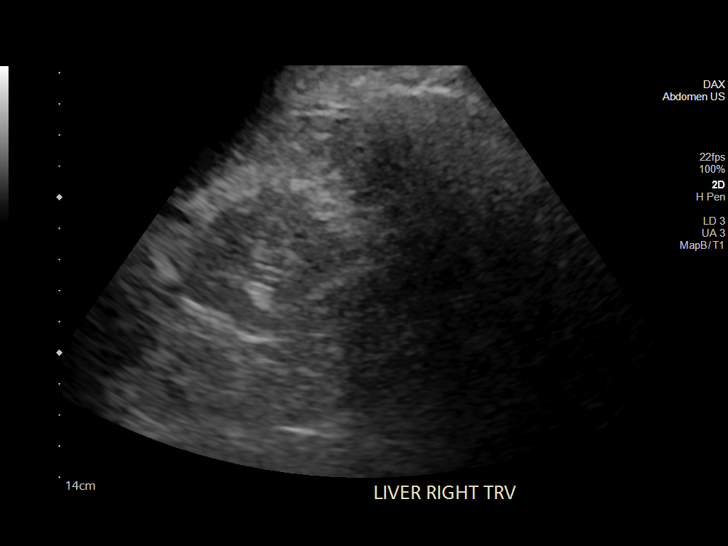
[im 24/63]
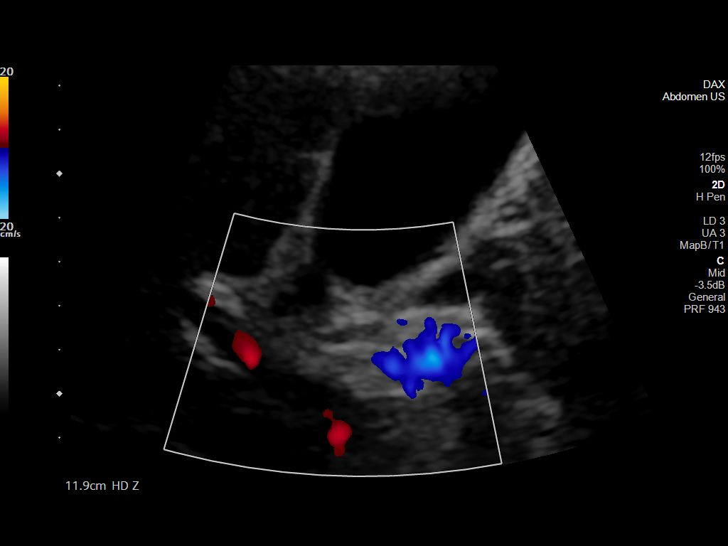
[im 29/63]
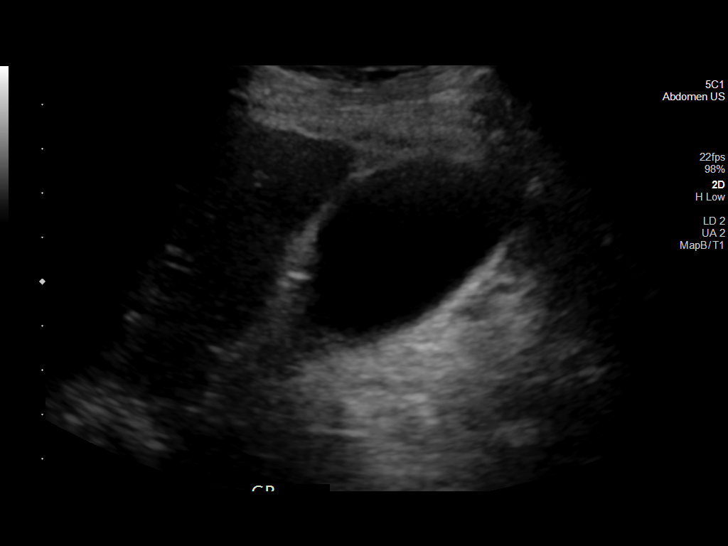
[im 34/63]
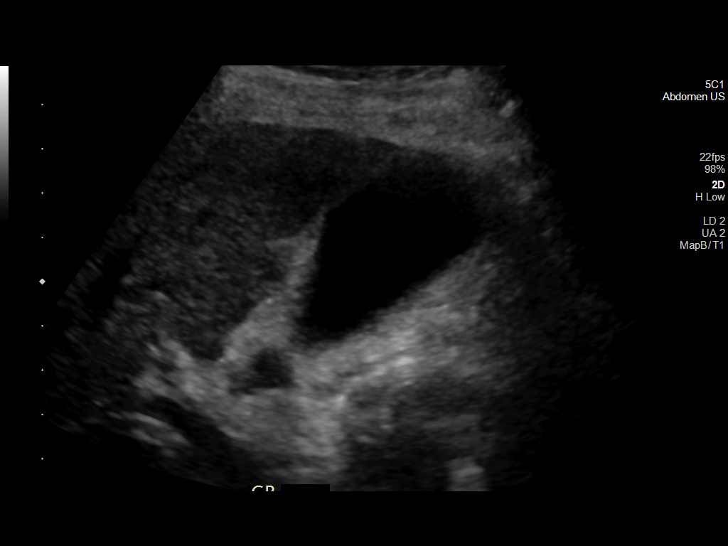
[im 39/63]
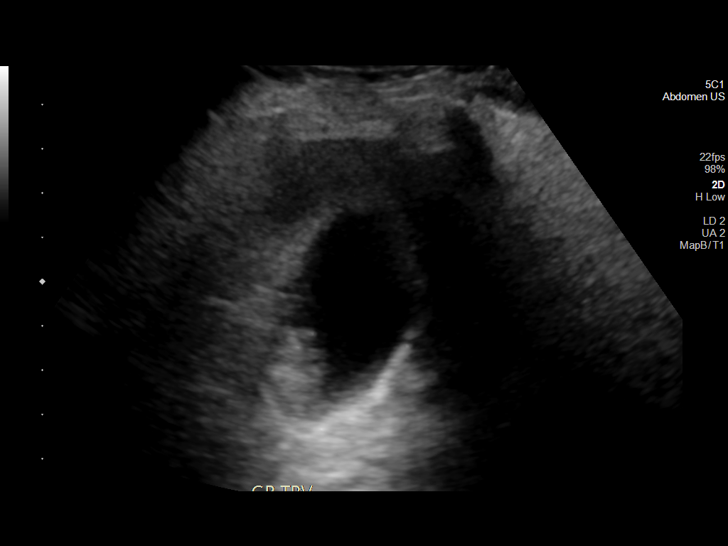
[im 42/63]
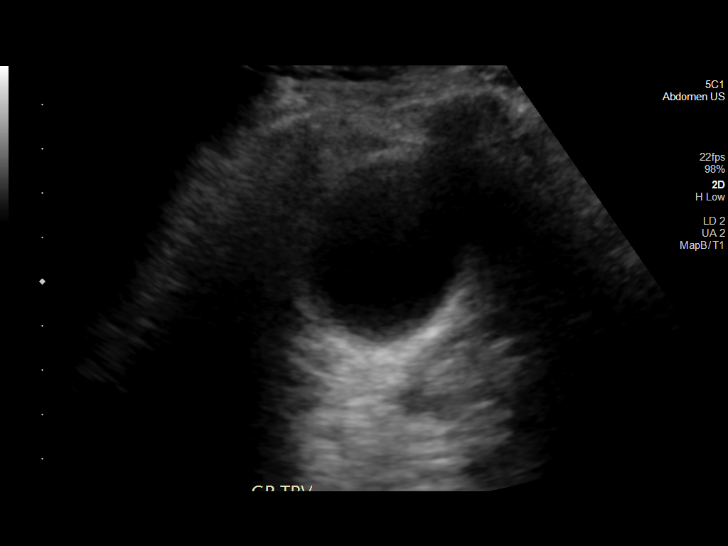
[im 47/63]
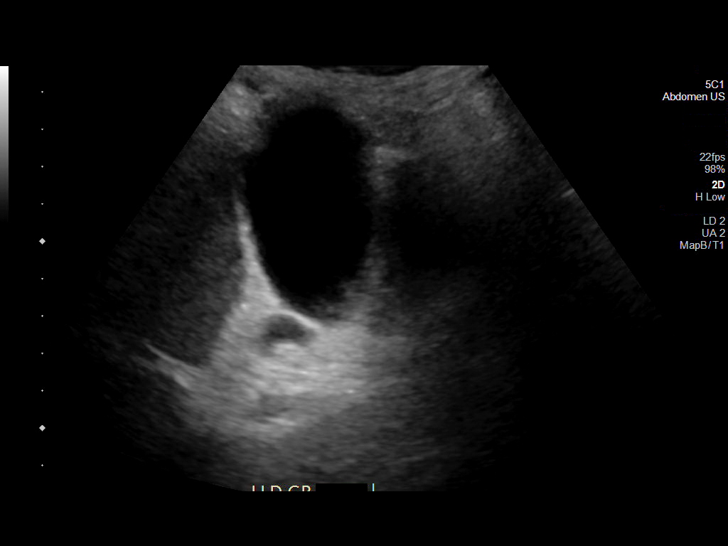
[im 52/63]
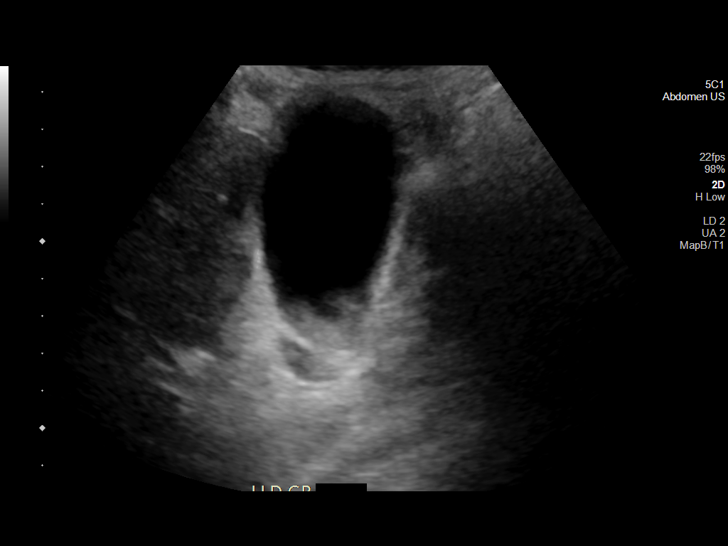
[im 57/63]
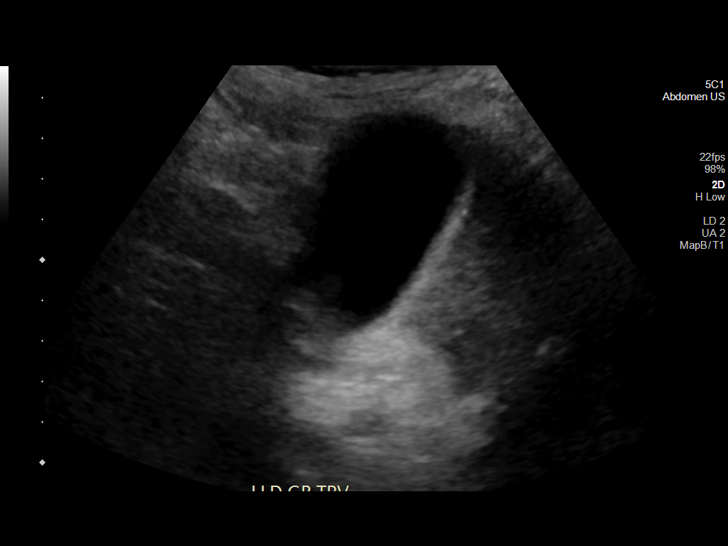
[im 63/63]
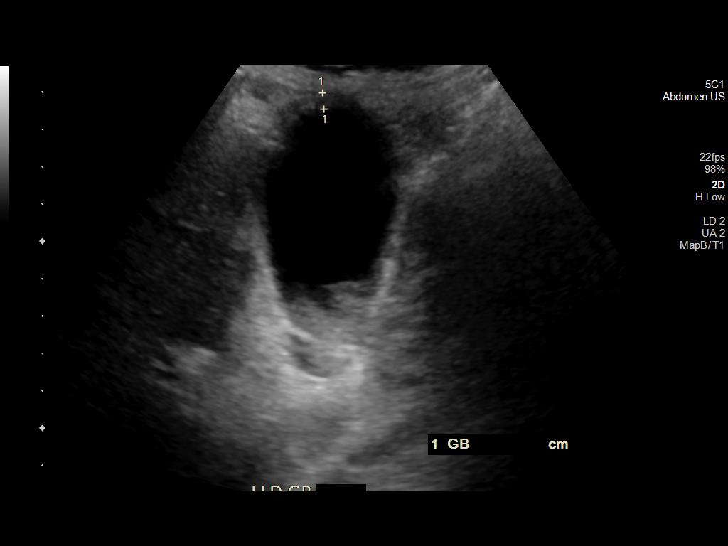

[14 of 25 positions shown; findings below may reference images not displayed]

FINDINGS: Gallbladder:

Several small gallstones. Minimally thickened gallbladder wall
measuring 4 mm. No pericholecystic fluid. Negative sonographic
Murphy's sign.

Common bile duct:

Diameter: 4 mm

Liver:

There is diffuse increased liver echogenicity most commonly seen in
the setting of fatty infiltration. Superimposed inflammation or
fibrosis is not excluded. Clinical correlation is recommended.
Portal vein is patent on color Doppler imaging with normal direction
of blood flow towards the liver.

Other: None.
IMPRESSION: 1. Cholelithiasis without sonographic evidence of acute
cholecystitis.
2. Fatty liver.

## 2022-08-21 DIAGNOSIS — Z95818 Presence of other cardiac implants and grafts: Secondary | ICD-10-CM | POA: Diagnosis not present

## 2022-08-21 DIAGNOSIS — Z4509 Encounter for adjustment and management of other cardiac device: Secondary | ICD-10-CM | POA: Diagnosis not present

## 2022-08-21 DIAGNOSIS — R55 Syncope and collapse: Secondary | ICD-10-CM | POA: Diagnosis not present

## 2022-08-29 DIAGNOSIS — E1129 Type 2 diabetes mellitus with other diabetic kidney complication: Secondary | ICD-10-CM | POA: Diagnosis not present

## 2022-08-29 DIAGNOSIS — I25810 Atherosclerosis of coronary artery bypass graft(s) without angina pectoris: Secondary | ICD-10-CM | POA: Diagnosis not present

## 2022-08-29 DIAGNOSIS — E782 Mixed hyperlipidemia: Secondary | ICD-10-CM | POA: Diagnosis not present

## 2022-08-29 DIAGNOSIS — N1831 Chronic kidney disease, stage 3a: Secondary | ICD-10-CM | POA: Diagnosis not present

## 2022-08-29 DIAGNOSIS — I129 Hypertensive chronic kidney disease with stage 1 through stage 4 chronic kidney disease, or unspecified chronic kidney disease: Secondary | ICD-10-CM | POA: Diagnosis not present

## 2022-09-05 DIAGNOSIS — R293 Abnormal posture: Secondary | ICD-10-CM | POA: Diagnosis not present

## 2022-09-06 DIAGNOSIS — R293 Abnormal posture: Secondary | ICD-10-CM | POA: Diagnosis not present

## 2022-09-09 DIAGNOSIS — R293 Abnormal posture: Secondary | ICD-10-CM | POA: Diagnosis not present

## 2022-09-15 DIAGNOSIS — R293 Abnormal posture: Secondary | ICD-10-CM | POA: Diagnosis not present

## 2022-09-21 DIAGNOSIS — Z4509 Encounter for adjustment and management of other cardiac device: Secondary | ICD-10-CM | POA: Diagnosis not present

## 2022-09-21 DIAGNOSIS — Z95818 Presence of other cardiac implants and grafts: Secondary | ICD-10-CM | POA: Diagnosis not present

## 2022-09-21 DIAGNOSIS — R55 Syncope and collapse: Secondary | ICD-10-CM | POA: Diagnosis not present

## 2022-10-01 ENCOUNTER — Other Ambulatory Visit: Payer: Self-pay

## 2022-10-01 ENCOUNTER — Emergency Department (HOSPITAL_COMMUNITY): Payer: HMO

## 2022-10-01 ENCOUNTER — Emergency Department (HOSPITAL_BASED_OUTPATIENT_CLINIC_OR_DEPARTMENT_OTHER): Payer: HMO

## 2022-10-01 ENCOUNTER — Emergency Department (HOSPITAL_COMMUNITY): Payer: HMO | Admitting: Anesthesiology

## 2022-10-01 ENCOUNTER — Encounter (HOSPITAL_BASED_OUTPATIENT_CLINIC_OR_DEPARTMENT_OTHER): Payer: Self-pay | Admitting: *Deleted

## 2022-10-01 ENCOUNTER — Inpatient Hospital Stay (HOSPITAL_BASED_OUTPATIENT_CLINIC_OR_DEPARTMENT_OTHER)
Admission: EM | Admit: 2022-10-01 | Discharge: 2022-10-06 | DRG: 024 | Disposition: A | Discharge status: Rehabilitation Facility | Payer: HMO | Attending: Neurology | Admitting: Neurology

## 2022-10-01 DIAGNOSIS — R29810 Facial weakness: Secondary | ICD-10-CM | POA: Diagnosis not present

## 2022-10-01 DIAGNOSIS — N400 Enlarged prostate without lower urinary tract symptoms: Secondary | ICD-10-CM | POA: Diagnosis present

## 2022-10-01 DIAGNOSIS — S51811A Laceration without foreign body of right forearm, initial encounter: Secondary | ICD-10-CM | POA: Diagnosis present

## 2022-10-01 DIAGNOSIS — H5347 Heteronymous bilateral field defects: Secondary | ICD-10-CM | POA: Diagnosis not present

## 2022-10-01 DIAGNOSIS — R29707 NIHSS score 7: Secondary | ICD-10-CM | POA: Diagnosis present

## 2022-10-01 DIAGNOSIS — Z79899 Other long term (current) drug therapy: Secondary | ICD-10-CM

## 2022-10-01 DIAGNOSIS — Z87441 Personal history of nephrotic syndrome: Secondary | ICD-10-CM

## 2022-10-01 DIAGNOSIS — E1122 Type 2 diabetes mellitus with diabetic chronic kidney disease: Secondary | ICD-10-CM | POA: Diagnosis present

## 2022-10-01 DIAGNOSIS — I480 Paroxysmal atrial fibrillation: Secondary | ICD-10-CM | POA: Diagnosis present

## 2022-10-01 DIAGNOSIS — I63532 Cerebral infarction due to unspecified occlusion or stenosis of left posterior cerebral artery: Secondary | ICD-10-CM | POA: Diagnosis not present

## 2022-10-01 DIAGNOSIS — K219 Gastro-esophageal reflux disease without esophagitis: Secondary | ICD-10-CM | POA: Diagnosis present

## 2022-10-01 DIAGNOSIS — I6349 Cerebral infarction due to embolism of other cerebral artery: Secondary | ICD-10-CM | POA: Diagnosis present

## 2022-10-01 DIAGNOSIS — H532 Diplopia: Secondary | ICD-10-CM | POA: Diagnosis present

## 2022-10-01 DIAGNOSIS — E1165 Type 2 diabetes mellitus with hyperglycemia: Secondary | ICD-10-CM | POA: Diagnosis present

## 2022-10-01 DIAGNOSIS — G4730 Sleep apnea, unspecified: Secondary | ICD-10-CM | POA: Diagnosis not present

## 2022-10-01 DIAGNOSIS — N1832 Chronic kidney disease, stage 3b: Secondary | ICD-10-CM | POA: Diagnosis present

## 2022-10-01 DIAGNOSIS — X58XXXA Exposure to other specified factors, initial encounter: Secondary | ICD-10-CM | POA: Diagnosis present

## 2022-10-01 DIAGNOSIS — H53461 Homonymous bilateral field defects, right side: Secondary | ICD-10-CM | POA: Diagnosis present

## 2022-10-01 DIAGNOSIS — I161 Hypertensive emergency: Secondary | ICD-10-CM | POA: Diagnosis not present

## 2022-10-01 DIAGNOSIS — Z7985 Long-term (current) use of injectable non-insulin antidiabetic drugs: Secondary | ICD-10-CM

## 2022-10-01 DIAGNOSIS — Z8249 Family history of ischemic heart disease and other diseases of the circulatory system: Secondary | ICD-10-CM

## 2022-10-01 DIAGNOSIS — I6523 Occlusion and stenosis of bilateral carotid arteries: Secondary | ICD-10-CM | POA: Diagnosis not present

## 2022-10-01 DIAGNOSIS — I639 Cerebral infarction, unspecified: Principal | ICD-10-CM | POA: Diagnosis present

## 2022-10-01 DIAGNOSIS — I10 Essential (primary) hypertension: Secondary | ICD-10-CM | POA: Diagnosis not present

## 2022-10-01 DIAGNOSIS — I484 Atypical atrial flutter: Secondary | ICD-10-CM | POA: Diagnosis not present

## 2022-10-01 DIAGNOSIS — E785 Hyperlipidemia, unspecified: Secondary | ICD-10-CM | POA: Diagnosis not present

## 2022-10-01 DIAGNOSIS — Z832 Family history of diseases of the blood and blood-forming organs and certain disorders involving the immune mechanism: Secondary | ICD-10-CM

## 2022-10-01 DIAGNOSIS — Z7902 Long term (current) use of antithrombotics/antiplatelets: Secondary | ICD-10-CM

## 2022-10-01 DIAGNOSIS — I63212 Cerebral infarction due to unspecified occlusion or stenosis of left vertebral arteries: Secondary | ICD-10-CM | POA: Diagnosis not present

## 2022-10-01 DIAGNOSIS — N179 Acute kidney failure, unspecified: Secondary | ICD-10-CM | POA: Diagnosis not present

## 2022-10-01 DIAGNOSIS — Z9889 Other specified postprocedural states: Secondary | ICD-10-CM

## 2022-10-01 DIAGNOSIS — Z7982 Long term (current) use of aspirin: Secondary | ICD-10-CM

## 2022-10-01 DIAGNOSIS — Z7901 Long term (current) use of anticoagulants: Secondary | ICD-10-CM

## 2022-10-01 DIAGNOSIS — F418 Other specified anxiety disorders: Secondary | ICD-10-CM | POA: Diagnosis not present

## 2022-10-01 DIAGNOSIS — Z951 Presence of aortocoronary bypass graft: Secondary | ICD-10-CM

## 2022-10-01 DIAGNOSIS — F419 Anxiety disorder, unspecified: Secondary | ICD-10-CM | POA: Diagnosis present

## 2022-10-01 DIAGNOSIS — G4733 Obstructive sleep apnea (adult) (pediatric): Secondary | ICD-10-CM | POA: Diagnosis present

## 2022-10-01 DIAGNOSIS — R29818 Other symptoms and signs involving the nervous system: Secondary | ICD-10-CM | POA: Diagnosis not present

## 2022-10-01 DIAGNOSIS — I63443 Cerebral infarction due to embolism of bilateral cerebellar arteries: Principal | ICD-10-CM | POA: Diagnosis present

## 2022-10-01 DIAGNOSIS — I129 Hypertensive chronic kidney disease with stage 1 through stage 4 chronic kidney disease, or unspecified chronic kidney disease: Secondary | ICD-10-CM | POA: Diagnosis present

## 2022-10-01 DIAGNOSIS — I2510 Atherosclerotic heart disease of native coronary artery without angina pectoris: Secondary | ICD-10-CM | POA: Diagnosis not present

## 2022-10-01 DIAGNOSIS — Z7984 Long term (current) use of oral hypoglycemic drugs: Secondary | ICD-10-CM

## 2022-10-01 DIAGNOSIS — I6622 Occlusion and stenosis of left posterior cerebral artery: Secondary | ICD-10-CM | POA: Diagnosis not present

## 2022-10-01 DIAGNOSIS — F32A Depression, unspecified: Secondary | ICD-10-CM | POA: Diagnosis not present

## 2022-10-01 DIAGNOSIS — N529 Male erectile dysfunction, unspecified: Secondary | ICD-10-CM | POA: Diagnosis present

## 2022-10-01 LAB — CBG MONITORING, ED: Glucose-Capillary: 184 mg/dL — ABNORMAL HIGH (ref 70–99)

## 2022-10-01 LAB — URINALYSIS, ROUTINE W REFLEX MICROSCOPIC
Bilirubin Urine: NEGATIVE
Glucose, UA: 500 mg/dL — AB
Hgb urine dipstick: NEGATIVE
Ketones, ur: NEGATIVE mg/dL
Leukocytes,Ua: NEGATIVE
Nitrite: NEGATIVE
Specific Gravity, Urine: 1.02 (ref 1.005–1.030)
pH: 7.5 (ref 5.0–8.0)

## 2022-10-01 LAB — CBC
HCT: 43.8 % (ref 39.0–52.0)
Hemoglobin: 14.9 g/dL (ref 13.0–17.0)
MCH: 32.4 pg (ref 26.0–34.0)
MCHC: 34 g/dL (ref 30.0–36.0)
MCV: 95.2 fL (ref 80.0–100.0)
Platelets: 180 10*3/uL (ref 150–400)
RBC: 4.6 MIL/uL (ref 4.22–5.81)
RDW: 13.2 % (ref 11.5–15.5)
WBC: 7.5 10*3/uL (ref 4.0–10.5)
nRBC: 0 % (ref 0.0–0.2)

## 2022-10-01 LAB — COMPREHENSIVE METABOLIC PANEL
ALT: 15 U/L (ref 0–44)
AST: 20 U/L (ref 15–41)
Albumin: 4.3 g/dL (ref 3.5–5.0)
Alkaline Phosphatase: 64 U/L (ref 38–126)
Anion gap: 9 (ref 5–15)
BUN: 28 mg/dL — ABNORMAL HIGH (ref 8–23)
CO2: 28 mmol/L (ref 22–32)
Calcium: 9.9 mg/dL (ref 8.9–10.3)
Chloride: 99 mmol/L (ref 98–111)
Creatinine, Ser: 1.39 mg/dL — ABNORMAL HIGH (ref 0.61–1.24)
GFR, Estimated: 49 mL/min — ABNORMAL LOW (ref >60)
Glucose, Bld: 169 mg/dL — ABNORMAL HIGH (ref 70–99)
Potassium: 4 mmol/L (ref 3.5–5.1)
Sodium: 136 mmol/L (ref 135–145)
Total Bilirubin: 0.5 mg/dL (ref 0.3–1.2)
Total Protein: 7.7 g/dL (ref 6.5–8.1)

## 2022-10-01 LAB — DIFFERENTIAL
Abs Immature Granulocytes: 0.03 10*3/uL (ref 0.00–0.07)
Basophils Absolute: 0.1 10*3/uL (ref 0.0–0.1)
Basophils Relative: 1 %
Eosinophils Absolute: 0.4 10*3/uL (ref 0.0–0.5)
Eosinophils Relative: 5 %
Immature Granulocytes: 0 %
Lymphocytes Relative: 31 %
Lymphs Abs: 2.3 10*3/uL (ref 0.7–4.0)
Monocytes Absolute: 0.6 10*3/uL (ref 0.1–1.0)
Monocytes Relative: 8 %
Neutro Abs: 4.1 10*3/uL (ref 1.7–7.7)
Neutrophils Relative %: 55 %

## 2022-10-01 LAB — RAPID URINE DRUG SCREEN, HOSP PERFORMED
Amphetamines: NOT DETECTED
Barbiturates: NOT DETECTED
Benzodiazepines: NOT DETECTED
Cocaine: NOT DETECTED
Opiates: NOT DETECTED
Tetrahydrocannabinol: NOT DETECTED

## 2022-10-01 LAB — PROTIME-INR
INR: 1 (ref 0.8–1.2)
Prothrombin Time: 13.6 seconds (ref 11.4–15.2)

## 2022-10-01 LAB — ETHANOL: Alcohol, Ethyl (B): <10 mg/dL (ref <10)

## 2022-10-01 LAB — APTT: aPTT: 27 seconds (ref 24–36)

## 2022-10-01 MED ORDER — IOHEXOL 350 MG/ML SOLN
100.0000 mL | Freq: Once | INTRAVENOUS | Status: AC | PRN
  Administered 2022-10-01: 100 mL via INTRAVENOUS

## 2022-10-01 NOTE — Progress Notes (Signed)
Telestroke Note  2323: Telestroke RN logged back on stroke cart at this time.Stroke cart at patient's bedside.    Derrill Kay Telestroke RN

## 2022-10-01 NOTE — ED Notes (Signed)
[facility] at bedside to transport pt to [facility] 

## 2022-10-01 NOTE — ED Notes (Signed)
ED Provider at bedside. 

## 2022-10-01 NOTE — ED Triage Notes (Signed)
Pt had double vision which began about an hour ago and right eye is looking toward nose and left eye is looking straight. Pt reports that he is on a blood thinner.  Pt has skin tear on right forearm (saline dressing applied)

## 2022-10-01 NOTE — Progress Notes (Signed)
Telestroke Note  2232: Dr. Stephannie Peters notified that CTA/CTP images are populating in [facility].    Derrill Kay Telestroke RN

## 2022-10-01 NOTE — Progress Notes (Signed)
Telestroke Note   2052: Code stroke cart activated at this time. EDP on camera assessing patient at this time. Patient arrived with complaints of sudden onset double vision and unsteadiness when standing. LKW 1945 per patient. Patient also presently in Afib but reports to EDP that he does not have a history of Afib. mRS 0. Patient has a history of HTN, CAD, CABG, DM, and loop recorder placement. Patient takes ASA daily.   2054: Patient left for CT at this time.   2057: TSP paged at this time.   2059: Dr. Stephannie Peters on camera at this time. Patient history and report provided to Dr. Stephannie Peters on camera at this time.   2107: Patient returned from CT at this time.   2110: Dr. Stephannie Peters performing neuro assessment at this time.   2125: Dr. Stephannie Peters speaking to patient's daughter at this time. Per Dr. Smith Mince assessment, LKW is unclear. Daughter is also unclear regarding LKW time. Patient to return to CT for a CTA/CTP.   2128: NCCT imaging results provided to Dr. Stephannie Peters at this time. Dr. Stephannie Peters to follow up with Dr.  Rush Landmark at this time.    Derrill Kay Telestroke RN

## 2022-10-01 NOTE — ED Triage Notes (Signed)
Pt is here for skin tear on left forearm.  Last tetanus within last 5 years

## 2022-10-01 NOTE — ED Notes (Addendum)
Renea Ee RN (IR) and [employer] manager (stroke nurse) given report by this RN

## 2022-10-01 NOTE — ED Notes (Signed)
Patient transported to CT with this RN 

## 2022-10-01 NOTE — ED Notes (Signed)
Patient returned from CT

## 2022-10-01 NOTE — ED Notes (Signed)
Pt transported to CT by [employer]

## 2022-10-01 NOTE — H&P (Signed)
Neurology H&P  CC: Visual changes  History is obtained from: Patient, daughter  HPI: [name] is a 88 y.o. male with a history of diabetes, hypertension, hyperlipidemia who was in his normal state of health earlier this evening.  He developed diplopia around 7:45 PM, but did not realize that he had a hemianopia until he was being evaluated by teleneurology.  Given that he was unaware of his deficit, it was decided that this was unclear time of onset and therefore TNK was not given.  A CTA/CTP was performed, which does show a P1 occlusion with significant penumbra, and based on discussion with teleneurology, the decision was made to proceed with IR thrombectomy based on normal CT with favorable CT perfusion.   LKW: unclear tpa given?: No, unclear time of onset IR Thrombectomy?  Yes Modified Rankin Scale: 2-Slight disability-UNABLE to perform all activities but does not need assistance NIHSS: 7  Past Medical History:  Diagnosis Date   Acute renal failure (HCC)    secondary to nephrotic syndrome post cath, resolved with steroids   Anxiety    CAD (coronary artery disease) 04/2006   LIMA to LAD, sequential SVG to first, second, and third OM's, sequential SVG to mid RCA and PDA    Depression    Diabetes mellitus without complication (HCC)    ED (erectile dysfunction)    HTN (hypertension)    Hx of adenomatous colonic polyps    Hyperlipidemia    Loop recorder: Biotronic Biomonitor II loop 07/28/2021 07/28/2021   Obesity      Family History  Problem Relation Age of Onset   Heart attack Mother    Clotting disorder Father        died of renal disease   Heart disease Sister    Cancer Sister    Colon cancer Neg Hx      Social History:  reports that he has never smoked. He has never used smokeless tobacco. He reports that he does not drink alcohol and does not use drugs.   Prior to Admission medications   Medication Sig Start Date End Date Taking? Authorizing Provider   acebutolol (SECTRAL) 200 MG capsule Take 1 capsule by mouth twice daily 03/16/22   Yates Decamp, MD  aspirin EC 81 MG tablet Take 81 mg by mouth daily.    [provider]  atorvastatin (LIPITOR) 40 MG tablet Take 40 mg by mouth daily.    [provider]  CIALIS 5 MG tablet Take 1 tablet by mouth every evening. 02/28/15   [provider]  furosemide (LASIX) 20 MG tablet Take 1 tablet by mouth daily as needed. Take in morning 12/21/10   Bensimhon, [name], MD  JARDIANCE 25 MG TABS tablet Take 1 tablet by mouth every evening. 02/28/15   [provider]  metFORMIN (GLUCOPHAGE) 1000 MG tablet 1 pill twice a day 12/28/19   [provider]  MYRBETRIQ 50 MG TB24 tablet  06/08/20   [provider]  olmesartan-hydrochlorothiazide (BENICAR HCT) 40-25 MG tablet TAKE 1 TABLET BY MOUTH ONCE DAILY IN THE MORNING 06/13/22   Yates Decamp, MD  Saxagliptin-Metformin (KOMBIGLYZE XR) 08-998 MG TB24 Take 1 tablet by mouth daily.    [provider]  Semaglutide,0.25 or 0.5MG /DOS, (OZEMPIC, 0.25 OR 0.5 MG/DOSE,) 2 MG/3ML SOPN Inject 0.5 mg into the skin once a week. 03/07/22   [provider]  tamsulosin (FLOMAX) 0.4 MG CAPS capsule Take 1 capsule by mouth daily. 09/18/19   [provider]  vitamin E 100 UNIT capsule Take 100 Units by mouth daily.    [provider]  ZETIA 10 MG tablet Take 1 tablet by mouth every morning. 03/04/15   [provider]     Exam: Current vital signs: BP (!) 166/86   Pulse 73   Resp (!) 21   Wt 79.4 kg   SpO2 95%   BMI 25.84 kg/m    Physical Exam  Constitutional: Appears well-developed and well-nourished.  Psych: Affect appropriate to situation Eyes: No scleral injection HENT: No OP obstrucion Head: Normocephalic.  Cardiovascular: Normal rate and regular rhythm.  Respiratory: Effort normal and breath sounds normal to anterior ascultation GI: Soft.  No distension. There is no tenderness.   Skin: WDI  Neuro: Mental Status: He is awake, alert, gives the month is July or August, but seems to understand the situation.  He is has no signs of aphasia or neglect Cranial Nerves: II: He has a dense right hemianopia. Pupils are equal, round, and reactive to light.   III,IV, VI: He has a left gaze preference, only able to cross midline with significant effort V: Facial sensation is symmetric to temperature VII: Facial movement with mild right facial weakness VIII: hearing is intact to voice X: Uvula elevates symmetrically XI: Shoulder shrug is symmetric. XII: tongue is midline without atrophy or fasciculations.  Motor: Tone is normal. Bulk is normal. 5/5 strength was present on the left, on the right he has 4/5 strength of the right arm and leg Sensory: Sensation is symmetric to light touch and temperature in the arms and legs. Cerebellar: No clear ataxia in the left, does not perform on the right  I have reviewed labs in epic and the pertinent results are: Creatinine 1.39  I have reviewed the images obtained:ct/cta/ctp- penumbra in the lef tp1 distribution.   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    Secondary Diagnosis: Essential (primary) hypertension and Type 2 diabetes mellitus w/o complications   Impression: 88 year old male with left PCA occlusion.  Though my suspicion is that this likely occurred at the same time his double vision started at 7:45 PM, it was not felt to be reliable for IV thrombolytics.  He is within the timeframe for mechanical thrombectomy and after discussing with his daughter and the patient, they agreed with proceeding.  Given that he has now developed weakness, I suspect that he has some thalamic involvement as well.  Plan: - HgbA1c, fasting lipid panel - MRI of the brain without contrast - Frequent neuro checks - Echocardiogram - Prophylactic therapy-Antiplatelet med: Aspirin  - dose 81mg  and plavix 75mg  daily  after 300mg  load  - Risk factor modification - Telemetry monitoring - PT consult, OT consult, Speech consult - Stroke team to follow    This patient is critically ill and at significant risk of neurological worsening, death and care requires constant monitoring of vital signs, hemodynamics,respiratory and cardiac monitoring, neurological assessment, discussion with family, other specialists and medical decision making of high complexity. I spent 35 minutes of neurocritical care time  in the care of  this patient. This was time spent independent of any time provided by nurse practitioner or PA.   Ritta Slot, MD [facility] [phone]  If 7pm- 7am, please page neurology on call as listed in AMION. 10/02/2022  2:17 AM

## 2022-10-01 NOTE — ED Provider Notes (Signed)
[place] EMERGENCY DEPARTMENT AT [facility] Provider Note   CSN: [id number] Arrival date & time: 10/01/22  2031     History  Chief Complaint  Patient presents with   Laceration    Skin tear    Diplopia    [name] is a 88 y.o. male.  The history is provided by the patient, the spouse and medical records. No language interpreter was used.  Neurologic Problem This is a new problem. The current episode started less than 1 hour ago. The problem occurs constantly. The problem has not changed since onset.Pertinent negatives include no chest pain, no abdominal pain, no headaches and no shortness of breath. Nothing aggravates the symptoms. Nothing relieves the symptoms. He has tried nothing for the symptoms. The treatment provided no relief.       Home Medications Prior to Admission medications   Medication Sig Start Date End Date Taking? Authorizing Provider  acebutolol (SECTRAL) 200 MG capsule Take 1 capsule by mouth twice daily 03/16/22   Yates Decamp, MD  aspirin EC 81 MG tablet Take 81 mg by mouth daily.    [provider]  atorvastatin (LIPITOR) 40 MG tablet Take 40 mg by mouth daily.    [provider]  CIALIS 5 MG tablet Take 1 tablet by mouth every evening. 02/28/15   [provider]  furosemide (LASIX) 20 MG tablet Take 1 tablet by mouth daily as needed. Take in morning 12/21/10   Bensimhon, [name], MD  JARDIANCE 25 MG TABS tablet Take 1 tablet by mouth every evening. 02/28/15   [provider]  metFORMIN (GLUCOPHAGE) 1000 MG tablet 1 pill twice a day 12/28/19   [provider]  MYRBETRIQ 50 MG TB24 tablet  06/08/20   [provider]  olmesartan-hydrochlorothiazide (BENICAR HCT) 40-25 MG tablet TAKE 1 TABLET BY MOUTH ONCE DAILY IN THE MORNING 06/13/22   Yates Decamp, MD  Saxagliptin-Metformin (KOMBIGLYZE XR) 08-998 MG TB24 Take 1 tablet by mouth daily.    [provider]  Semaglutide,0.25 or  0.5MG /DOS, (OZEMPIC, 0.25 OR 0.5 MG/DOSE,) 2 MG/3ML SOPN Inject 0.5 mg into the skin once a week. 03/07/22   [provider]  tamsulosin (FLOMAX) 0.4 MG CAPS capsule Take 1 capsule by mouth daily. 09/18/19   [provider]  vitamin E 100 UNIT capsule Take 100 Units by mouth daily.    [provider]  ZETIA 10 MG tablet Take 1 tablet by mouth every morning. 03/04/15   [provider]      Allergies    Ketorolac tromethamine    Review of Systems   Review of Systems  Constitutional:  Negative for chills, fatigue and fever.  HENT:  Negative for congestion.   Eyes:  Positive for visual disturbance.  Respiratory:  Negative for cough, chest tightness and shortness of breath.   Cardiovascular:  Negative for chest pain.  Gastrointestinal:  Positive for nausea. Negative for abdominal pain, constipation, diarrhea and vomiting.  Genitourinary:  Negative for dysuria.  Musculoskeletal:  Negative for back pain, neck pain and neck stiffness.  Skin:  Positive for wound.  Neurological:  Positive for dizziness. Negative for facial asymmetry, speech difficulty, weakness, light-headedness and headaches.  Psychiatric/Behavioral:  Negative for agitation.   All other systems reviewed and are negative.   Physical Exam Updated Vital Signs BP (!) 179/96   Pulse 73   Resp 12   Wt 79.4 kg   SpO2 100%   BMI 25.84 kg/m  Physical Exam Vitals  and nursing note reviewed.  Constitutional:      General: He is not in acute distress.    Appearance: He is well-developed. He is not ill-appearing, toxic-appearing or diaphoretic.  HENT:     Head: Normocephalic and atraumatic.     Nose: Nose normal.     Mouth/Throat:     Mouth: Mucous membranes are moist.  Eyes:     General: Visual field deficit present.     Extraocular Movements:     Right eye: Abnormal extraocular motion present.     Left eye: Nystagmus present.     Conjunctiva/sclera: Conjunctivae normal.     Pupils:  Pupils are equal, round, and reactive to light.     Comments: Patient's right I would not look out laterally.  Left side had nystagmus when he tried to do so.  Pupils are symmetric and reactive however.  Symmetric smile.  Clear speech.  Neck:     Vascular: No carotid bruit.  Cardiovascular:     Rate and Rhythm: Normal rate and regular rhythm.     Heart sounds: No murmur heard. Pulmonary:     Effort: Pulmonary effort is normal. No respiratory distress.     Breath sounds: Normal breath sounds. No wheezing, rhonchi or rales.  Chest:     Chest wall: No tenderness.  Abdominal:     Palpations: Abdomen is soft.     Tenderness: There is no abdominal tenderness.  Musculoskeletal:        General: No swelling or tenderness.     Cervical back: Neck supple. No tenderness.     Right lower leg: No edema.     Left lower leg: No edema.     Comments: Right forearm injury was bandaged and not assessed initially due to acuity of stroke needing acute intervention.  Skin:    General: Skin is warm and dry.     Capillary Refill: Capillary refill takes less than 2 seconds.     Findings: No erythema or rash.  Neurological:     Mental Status: He is alert.     Cranial Nerves: No facial asymmetry.     Sensory: No sensory deficit.     Motor: No tremor or abnormal muscle tone.     Comments: Otherwise no numbness, tingling, or weakness on rest of exam.   Psychiatric:        Mood and Affect: Mood normal.     ED Results / Procedures / Treatments   Labs (all labs ordered are listed, but only abnormal results are displayed) Labs Reviewed  COMPREHENSIVE METABOLIC PANEL - Abnormal; Notable for the following components:      Result Value   Glucose, Bld 169 (*)    BUN 28 (*)    Creatinine, Ser 1.39 (*)    GFR, Estimated 49 (*)    All other components within normal limits  URINALYSIS, ROUTINE W REFLEX MICROSCOPIC - Abnormal; Notable for the following components:   Color, Urine COLORLESS (*)    Glucose, UA  500 (*)    Protein, ur TRACE (*)    Bacteria, UA RARE (*)    All other components within normal limits  CBG MONITORING, ED - Abnormal; Notable for the following components:   Glucose-Capillary 184 (*)    All other components within normal limits  ETHANOL  CBC  DIFFERENTIAL  RAPID URINE DRUG SCREEN, HOSP PERFORMED  PROTIME-INR  APTT    EKG EKG Interpretation  Date/Time:  Saturday October 01 2022 20:47:07 EDT Ventricular Rate:  75 PR Interval:    QRS Duration: 118 QT Interval:  378 QTC Calculation: 422 R Axis:   25 Text Interpretation: Atrial flutter with 4:1 A-V conduction Minimal voltage criteria for LVH, may be normal variant ( R in aVL ) Anterolateral infarct (cited on or before 28-Sep-2021) Abnormal ECG When compared with ECG of 28-Sep-2021 19:04, Atrial flutter has replaced Sinus rhythm when comapred to prior, now aflutter. No STEMI Confirmed by Theda Belfast ([id number]) on 10/01/2022 9:04:12 PM  Radiology CT CEREBRAL PERFUSION W CONTRAST  Result Date: 10/01/2022 CLINICAL DATA:  Diplopia, stroke suspected EXAM: CT ANGIOGRAPHY HEAD AND NECK CT PERFUSION BRAIN TECHNIQUE: Multidetector CT imaging of the head and neck was performed using the standard protocol during bolus administration of intravenous contrast. Multiplanar CT image reconstructions and MIPs were obtained to evaluate the vascular anatomy. Carotid stenosis measurements (when applicable) are obtained utilizing NASCET criteria, using the distal internal carotid diameter as the denominator. Multiphase CT imaging of the brain was performed following IV bolus contrast injection. Subsequent parametric perfusion maps were calculated using RAPID software. RADIATION DOSE REDUCTION: This exam was performed according to the departmental dose-optimization program which includes automated exposure control, adjustment of the mA and/or kV according to patient size and/or use of iterative reconstruction technique. CONTRAST:  OMNIPAQUE  IOHEXOL 350 MG/ML SOLN COMPARISON:  No prior CTA available, correlation is made with CT head 10/01/2022 FINDINGS: CT HEAD FINDINGS For noncontrast findings, please see same day CT head. CTA NECK FINDINGS Aortic arch: Four-vessel arch, with the left vertebral artery originating from the aorta. Imaged portion shows no evidence of aneurysm or dissection. No significant stenosis of the major arch vessel origins. Aortic atherosclerosis, with significant noncalcified and possibly ulcerated plaque. Right carotid system: No evidence of dissection, occlusion, or hemodynamically significant stenosis (greater than 50%). Retropharyngeal course of the proximal right ICA. Left carotid system: No evidence of dissection, occlusion, or hemodynamically significant stenosis (greater than 50%). Vertebral arteries: No evidence of dissection, occlusion, or hemodynamically significant stenosis (greater than 50%). Skeleton: No acute osseous abnormality. Degenerative changes in the cervical spine. Status post median sternotomy. Other neck: No acute finding. Upper chest: No focal pulmonary opacity or pleural effusion. Review of the MIP images confirms the above findings CTA HEAD FINDINGS Anterior circulation: Both internal carotid arteries are patent to the termini, with mild stenosis in the left cavernous segment, severe stenosis in the left supraclinoid segment proximally, and moderate stenosis in the right supraclinoid segment. A1 segments patent. Normal anterior communicating artery. Anterior cerebral arteries are patent to their distal aspects without significant stenosis. No M1 stenosis or occlusion. MCA branches perfused to their distal aspects without significant stenosis. Posterior circulation: Vertebral arteries patent to the vertebrobasilar junction without significant stenosis. Severe stenosis at the origin of the right PICA (series 7, image 136). The left PICA is patent. Basilar patent to its distal aspect with mild stenosis in  the distal basilar (series 7, image 120). Superior cerebellar arteries patent proximally. Short segment occlusion of the proximal left P 1 segment (series 7, image 108). Additional moderate stenosis in the distal left P2 segment (series 7, image 112). Patent right P1 segment. The right PCA branches are patent to to their distal aspects with some irregularity but without significant stenosis. The right posterior communicating artery is patent. The left posterior communicating artery is not visualized. Venous sinuses: As permitted by contrast timing, patent. Anatomic variants: None significant. Review of the MIP images confirms the above findings CT Brain Perfusion Findings: ASPECTS: 10  CBF (<30%) Volume: 0mL Perfusion (Tmax>6.0s) volume: 19mL Mismatch Volume: 19mL Infarction Location:No infarct core identified. Possible area of decreased perfusion in the superior left cerebellum, along the posterior and lateral aspects, or left occipital lobe. IMPRESSION: 1. Short segment occlusion of the proximal left P1 segment, with additional moderate stenosis in the distal left P2 segment. 2. No infarct core identified. Possible area of decreased perfusion in the superior left cerebellum or left occipital lobe. This could reflect decreased perfusion related to short segment left P1 occlusion. Superior cerebellar decreased perfusion 3. Severe stenosis in the left supraclinoid ICA and moderate stenosis in the right supraclinoid ICA. 4. Severe stenosis at the origin of the right PICA. Mild stenosis in the distal basilar artery. 5. No hemodynamically significant stenosis in the neck. 6. Aortic atherosclerosis. Aortic Atherosclerosis (ICD10-I70.0). These results were called by telephone at the time of interpretation on 10/01/2022 at 10:59 pm to provider [facility] , who verbally acknowledged these results. Electronically Signed   By: Wiliam Ke M.D.   On: 10/01/2022 22:59   CT ANGIO HEAD NECK W WO CM  Result Date:  10/01/2022 CLINICAL DATA:  Diplopia, stroke suspected EXAM: CT ANGIOGRAPHY HEAD AND NECK CT PERFUSION BRAIN TECHNIQUE: Multidetector CT imaging of the head and neck was performed using the standard protocol during bolus administration of intravenous contrast. Multiplanar CT image reconstructions and MIPs were obtained to evaluate the vascular anatomy. Carotid stenosis measurements (when applicable) are obtained utilizing NASCET criteria, using the distal internal carotid diameter as the denominator. Multiphase CT imaging of the brain was performed following IV bolus contrast injection. Subsequent parametric perfusion maps were calculated using RAPID software. RADIATION DOSE REDUCTION: This exam was performed according to the departmental dose-optimization program which includes automated exposure control, adjustment of the mA and/or kV according to patient size and/or use of iterative reconstruction technique. CONTRAST:  OMNIPAQUE IOHEXOL 350 MG/ML SOLN COMPARISON:  No prior CTA available, correlation is made with CT head 10/01/2022 FINDINGS: CT HEAD FINDINGS For noncontrast findings, please see same day CT head. CTA NECK FINDINGS Aortic arch: Four-vessel arch, with the left vertebral artery originating from the aorta. Imaged portion shows no evidence of aneurysm or dissection. No significant stenosis of the major arch vessel origins. Aortic atherosclerosis, with significant noncalcified and possibly ulcerated plaque. Right carotid system: No evidence of dissection, occlusion, or hemodynamically significant stenosis (greater than 50%). Retropharyngeal course of the proximal right ICA. Left carotid system: No evidence of dissection, occlusion, or hemodynamically significant stenosis (greater than 50%). Vertebral arteries: No evidence of dissection, occlusion, or hemodynamically significant stenosis (greater than 50%). Skeleton: No acute osseous abnormality. Degenerative changes in the cervical spine. Status  post median sternotomy. Other neck: No acute finding. Upper chest: No focal pulmonary opacity or pleural effusion. Review of the MIP images confirms the above findings CTA HEAD FINDINGS Anterior circulation: Both internal carotid arteries are patent to the termini, with mild stenosis in the left cavernous segment, severe stenosis in the left supraclinoid segment proximally, and moderate stenosis in the right supraclinoid segment. A1 segments patent. Normal anterior communicating artery. Anterior cerebral arteries are patent to their distal aspects without significant stenosis. No M1 stenosis or occlusion. MCA branches perfused to their distal aspects without significant stenosis. Posterior circulation: Vertebral arteries patent to the vertebrobasilar junction without significant stenosis. Severe stenosis at the origin of the right PICA (series 7, image 136). The left PICA is patent. Basilar patent to its distal aspect with mild stenosis in the distal basilar (series  7, image 120). Superior cerebellar arteries patent proximally. Short segment occlusion of the proximal left P 1 segment (series 7, image 108). Additional moderate stenosis in the distal left P2 segment (series 7, image 112). Patent right P1 segment. The right PCA branches are patent to to their distal aspects with some irregularity but without significant stenosis. The right posterior communicating artery is patent. The left posterior communicating artery is not visualized. Venous sinuses: As permitted by contrast timing, patent. Anatomic variants: None significant. Review of the MIP images confirms the above findings CT Brain Perfusion Findings: ASPECTS: 10 CBF (<30%) Volume: 0mL Perfusion (Tmax>6.0s) volume: 19mL Mismatch Volume: 19mL Infarction Location:No infarct core identified. Possible area of decreased perfusion in the superior left cerebellum, along the posterior and lateral aspects, or left occipital lobe. IMPRESSION: 1. Short segment occlusion  of the proximal left P1 segment, with additional moderate stenosis in the distal left P2 segment. 2. No infarct core identified. Possible area of decreased perfusion in the superior left cerebellum or left occipital lobe. This could reflect decreased perfusion related to short segment left P1 occlusion. Superior cerebellar decreased perfusion 3. Severe stenosis in the left supraclinoid ICA and moderate stenosis in the right supraclinoid ICA. 4. Severe stenosis at the origin of the right PICA. Mild stenosis in the distal basilar artery. 5. No hemodynamically significant stenosis in the neck. 6. Aortic atherosclerosis. Aortic Atherosclerosis (ICD10-I70.0). These results were called by telephone at the time of interpretation on 10/01/2022 at 10:59 pm to provider [facility] , who verbally acknowledged these results. Electronically Signed   By: Wiliam Ke M.D.   On: 10/01/2022 22:59   CT HEAD CODE STROKE WO CONTRAST  Result Date: 10/01/2022 CLINICAL DATA:  Code stroke.  Double vision EXAM: CT HEAD WITHOUT CONTRAST TECHNIQUE: Contiguous axial images were obtained from the base of the skull through the vertex without intravenous contrast. RADIATION DOSE REDUCTION: This exam was performed according to the departmental dose-optimization program which includes automated exposure control, adjustment of the mA and/or kV according to patient size and/or use of iterative reconstruction technique. COMPARISON:  03/11/2017 FINDINGS: Brain: No evidence of acute infarction, hemorrhage, mass, mass effect, or midline shift. No hydrocephalus or extra-axial collection. Age related cerebral atrophy. Ex vacuo dilatation of the ventricles. Periventricular white matter changes, likely the sequela of chronic small vessel ischemic disease. Vascular: No hyperdense vessel. Skull: Negative for fracture or focal lesion. Sinuses/Orbits: Chronic bilateral maxillary sinusitis. Mucosal thickening in the ethmoid air cells. Status post  bilateral lens replacements. Dysconjugate gaze Other: The mastoid air cells are well aerated. ASPECTS [facility] Stroke Program Early CT Score) - Ganglionic level infarction (caudate, lentiform nuclei, internal capsule, insula, M1-M3 cortex): 7 - Supraganglionic infarction (M4-M6 cortex): 3 Total score (0-10 with 10 being normal): 10 IMPRESSION: 1. No acute intracranial process. 2. ASPECTS is 10. Code stroke imaging results were communicated on 10/01/2022 at 9:23 pm to provider [facility] via telephone, who verbally acknowledged these results. Electronically Signed   By: Wiliam Ke M.D.   On: 10/01/2022 21:24    Procedures Procedures    CRITICAL CARE Performed by: Canary Brim Akiyah Eppolito Total critical care time: 60 minutes Critical care time was exclusive of separately billable procedures and treating other patients. Critical care was necessary to treat or prevent imminent or life-threatening deterioration. Critical care was time spent personally by me on the following activities: development of treatment plan with patient and/or surrogate as well as nursing, discussions with consultants, evaluation of patient's response to treatment, examination of  patient, obtaining history from patient or surrogate, ordering and performing treatments and interventions, ordering and review of laboratory studies, ordering and review of radiographic studies, pulse oximetry and re-evaluation of patient's condition.  Medications Ordered in ED Medications - No data to display  ED Course/ Medical Decision Making/ A&P                             Medical Decision Making Amount and/or Complexity of Data Reviewed Labs: ordered. Radiology: ordered.  Risk Decision regarding hospitalization.    [name] is a 88 y.o. male with a past medical history significant for CAD, hypertension, hyperlipidemia, diabetes, and sleep apnea who presents for neurologic complaint.  According to patient and family, he was last  normal approximately 1 hour prior to arrival at 7:45 PM when he noticed sudden onset of double vision and difficulty with ambulation due to unsteadiness.  He has never had this before.  No history of strokes reported.  No history of TIAs to his knowledge.  He was at his baseline without any symptoms and drove somewhere before this onset.  Denies any headache or neck pain.  Denies trauma.  Denies palpitations, chest pain, shortness of breath.  No history of A-fib to his knowledge.  No vomiting but had some mild nausea with that initially.  Denies any speech difficulties or any numbness tingling or weakness of extremities.  I was called to see patient in triage and on my evaluation, patient does have disconjugate gaze.  His right eye will not look laterally.  He does not have a facial droop and has intact sensation throughout.  Intact grip strength bilaterally and intact strength in the legs.  Finger-nose-finger was difficult due to the vision changes but appeared intact.  Patient did have right-sided hemianopia in both eyes that he does not know when it began.  Patient was activated as a code stroke and quickly taken to CT scanner.  CT initially did not show evidence of acute bleed or acute stroke.  Neurology felt that although the onset is quite clear when the diplopia began, the hemianopia is uncertain.  Thus, patient is not a candidate for TNK at this time.  They recommended MRI and admission to medicine to [facility] for further stroke workup but get CTA and perfusion if possible initially.  CTA was completed and patient does have a P1 occlusion acutely.  There is penumbra and no core infarct at this time.  I coordinated and spoke with Dr. Stephannie Peters with teleneurology and Dr. Amada Jupiter with [facility] neurology team and they spoke with the interventional neuroradiologist who feels patient is a candidate for thrombectomy.  I was present when Dr. Stephannie Peters spoke with patient on speaker phone and  they are amenable to the procedure.  Patient will be transferred emergently to interventional radiology suite at [facility] and will then be admitted by neurology after procedure.  Patient will be transferred for further management.   Of note, patient has a skin tear to the right wrist that was not assessed due to initial worry about the acute stroke.  Anticipate assessment after acute stroke is managed.           Final Clinical Impression(s) / ED Diagnoses Final diagnoses:  Cerebrovascular accident (CVA), unspecified mechanism (HCC)  Diplopia     Clinical Impression: 1. Cerebrovascular accident (CVA), unspecified mechanism (HCC)   2. Diplopia     Disposition: Admit  This note  was prepared with assistance of Conservation officer, historic buildings. Occasional wrong-word or sound-a-like substitutions may have occurred due to the inherent limitations of voice recognition software.     Madigan Rosensteel, Canary Brim, MD 10/01/22 [phone]

## 2022-10-01 NOTE — Consult Note (Addendum)
[facility] TeleNeurology Consult Services   Patient Name:   [name], [name] Date of Birth:   1934/06/18 Date of Service:   10/01/2022 20:57:33  Diagnosis:       I63.89 - Cerebrovascular accident (CVA) due to other mechanism [facility])  Impression:      This is an 88 year old man with history of coronary artery disease, obstructive sleep apnea, hyperlipidemia, anxiety, and chronic kidney disease here with double vision- noted on exam to have decreased right eye abduction on far right gaze, in addition to a right homonymous hemianopia. The patient did not notice the latter deficit until specifically tested- so- I am not able to establish a clear last known well/ onset for that. He is therefore not a candidate for IV thrombolytics. ECG was apparently concerning for atrial flutter/ fibrillation.  Our recommendations are outlined below.  Recommendations:        Stroke/Telemetry Floor       Neuro Checks       Bedside Swallow Eval       DVT Prophylaxis       IV Fluids, Normal Saline       Head of Bed 30 Degrees       Euglycemia and Avoid Hyperthermia (PRN Acetaminophen)       Bolus with Clopidogrel 300 mg bolus x1 and initiate dual antiplatelet therapy with Aspirin 81 mg daily and Clopidogrel 75 mg daily       Antihypertensives PRN if Blood pressure is greater than 220/120 or there is a concern for End organ damage/contraindications for permissive HTN. If blood pressure is greater than 220/120 give labetalol PO or IV or Vasotec IV with a goal of 15% reduction in BP during the first 24 hours.    ------------------------------------------------------------------------------  Advanced Imaging: Advanced imaging has been ordered. Results pending.   Metrics: Last Known Well: Unknown [facility] Notification Time: 10/01/2022 20:57:33 Arrival Time: 10/01/2022 20:31:00 Stamp Time: 10/01/2022 20:57:33 Initial Response Time: 10/01/2022 20:59:14 Symptoms: double  vision. Initial patient interaction: 10/01/2022 21:08:00 NIHSS Assessment Completed: 10/01/2022 21:25:00 Patient is not a candidate for Thrombolytic. Thrombolytic Medical Decision: 10/01/2022 21:25:00 Patient was not deemed candidate for Thrombolytic because of following reasons: Last Well Known Above 4.5 Hours.  CT head showed no acute hemorrhage or acute core infarct.  Primary Provider Notified of Diagnostic Impression and Management Plan on: 10/01/2022 22:10:00    ------------------------------------------------------------------------------  History of Present Illness: Patient is a 88 year old Male.  Patient was brought by private transportation with symptoms of double vision. This is an 88 year old man with history of coronary artery disease, obstructive sleep apnea, hyperlipidemia, anxiety, and chronic kidney disease here with double vision. Double vision started at about 19:45 the day of presentation. This is improved with closure of one eye or the other. The patient- when asked specifically about difficulties walking- denied this. He was noted on exam to have a right homonymous hemianopia. He'd not been aware of this until specifically examined- so- time of onset/last known well- with regards to this deficit- is unknown    Past Medical History:      Hypertension      Hyperlipidemia      Coronary Artery Disease      There is no history of Atrial Fibrillation      There is no history of Stroke [place] PMH:  history of coronary artery disease, obstructive sleep apnea, hyperlipidemia, anxiety, and chronic kidney disease  Medications:  No Anticoagulant use  Antiplatelet use: Yes aspirin Reviewed  EMR for current medications  Allergies:  Reviewed  Social History: Drug Use: No  Family History:  There is no family history of premature cerebrovascular disease pertinent to this consultation  ROS : 14 Points Review of Systems was performed and was negative except mentioned  in HPI.  Past Surgical History: There Is No Surgical History Contributory To Today's Visit     Examination: BP(203/94), Pulse(72), 1A: Level of Consciousness - Alert; keenly responsive + 0 1B: Ask Month and Age - Both Questions Right + 0 1C: Blink Eyes & Squeeze Hands - Performs Both Tasks + 0 2: Test Horizontal Extraocular Movements - Partial Gaze Palsy: Can Be Overcome + 1 3: Test Visual Fields - Complete Hemianopia + 2 4: Test Facial Palsy (Use Grimace if Obtunded) - Normal symmetry + 0 5A: Test Left Arm Motor Drift - No Drift for 10 Seconds + 0 5B: Test Right Arm Motor Drift - No Drift for 10 Seconds + 0 6A: Test Left Leg Motor Drift - No Drift for 5 Seconds + 0 6B: Test Right Leg Motor Drift - No Drift for 5 Seconds + 0 7: Test Limb Ataxia (FNF/Heel-Shin) - No Ataxia + 0 8: Test Sensation - Normal; No sensory loss + 0 9: Test Language/Aphasia - Normal; No aphasia + 0 10: Test Dysarthria - Normal + 0 11: Test Extinction/Inattention - No abnormality + 0  NIHSS Score: 3  NIHSS Free Text : Finger nose finger was limited- it is unclear whether the patient had trouble seeing, or whether he simply did not understand the exercise- he kept missing examiner's finger; patient was unsteady on his feet Decreased right eye abduction on far right gaze; had worsening of subjective diplopia on far right gaze  Pre-Morbid Modified Rankin Scale: 0 Points = No symptoms at all  Spoke with : Dr Rush Landmark  This consult was conducted in real time using interactive audio and Immunologist. Patient was informed of the technology being used for this visit and agreed to proceed. Patient located in hospital and provider located at home/office setting.   Patient is being evaluated for possible acute neurologic impairment and high probability of imminent or life-threatening deterioration. I spent total of 44 minutes providing care to this patient, including time for face to face visit via telemedicine,  review of medical records, imaging studies and discussion of findings with providers, the patient and/or family.   Dr Rosalita Chessman   [facility] For Inpatient follow-up with [facility] physician please call [facility] [phone]. This is not an outpatient service. Post hospital discharge, please contact hospital directly.  Please do not communicate with [facility] physicians via secure chat. If you have any questions, Please contact [facility]. Please call or reconsult our service if there are any clinical or diagnostic changes.    Advanced Imaging:  CTA Head and Neck Completed.  CTP Completed.  LVO:Yes  Discussed with NIR :No  Discussed with NIR Text : I spoke to another neurologist within the [facility] network who had already been in contact with NIR. THe patient was accepted for intervention- I then called the emergency department- and- while on speaker phone with the ED doctor- told the patient that he has a clot - which someone can potentially "roto rooter" although this does involve being under anesthesia. The patient was agreeable to undergoing a procedure to potentially remove the clot

## 2022-10-01 NOTE — Progress Notes (Signed)
Telestroke Note    2303: Dr. Stephannie Peters aware of CTA/CTP imaging results at this time. Radiology note states imaging results were called to Dr. Nada Libman at 2259. Care link call initiated by Dr. Stephannie Peters. Per Carelink Dr. Rush Landmark already called and initiated transfer to [facility]. Dr. Stephannie Peters calling Dr. Rush Landmark at this time to follow up.    Derrill Kay Telestroke RN

## 2022-10-01 NOTE — ED Notes (Signed)
-  Activated Code Stroke at 851pm via tele-cart.

## 2022-10-01 NOTE — Anesthesia Preprocedure Evaluation (Addendum)
Anesthesia Evaluation  Patient identified by MRN, date of birth, ID bandPreop documentation limited or incomplete due to emergent nature of procedure.  Airway Mallampati: II  TM Distance: >3 FB Neck ROM: Full    Dental no notable dental hx.    Pulmonary sleep apnea    Pulmonary exam normal        Cardiovascular hypertension, + CAD and + CABG (2008)   Rhythm:Regular Rate:Normal  Echocardiogram 07/12/2021: Low normal LV systolic function with visual EF 50-55%. Left ventricle cavity is normal in size. Mild to moderate left ventricular hypertrophy. Normal global wall motion. Abnormal septal wall motion due to post-operative septum. Indeterminate diastolic filling pattern, elevated LAP. Mild (Grade I) mitral regurgitation. Mild tricuspid regurgitation. No evidence of pulmonary hypertension. Compared to study 03/04/2021 no significant change.     Neuro/Psych   Anxiety Depression    CVA    GI/Hepatic negative GI ROS, Neg liver ROS,,,  Endo/Other  diabetes, Type 2, Oral Hypoglycemic Agents    Renal/GU   negative genitourinary   Musculoskeletal negative musculoskeletal ROS (+)    Abdominal Normal abdominal exam  (+)   Peds  Hematology Lab Results      Component                Value               Date                      WBC                      7.5                 10/01/2022                HGB                      14.9                10/01/2022                HCT                      43.8                10/01/2022                MCV                      95.2                10/01/2022                PLT                      180                 10/01/2022              Anesthesia Other Findings   Reproductive/Obstetrics                             Anesthesia Physical Anesthesia Plan  ASA: 4 and emergent  Anesthesia Plan: General   Post-op Pain Management:    Induction: Intravenous  PONV Risk  Score and Plan: 2 and Treatment may vary due to age or  medical condition  Airway Management Planned: Mask and Oral ETT  Additional Equipment: Arterial line  Intra-op Plan:   Post-operative Plan: Possible Post-op intubation/ventilation  Informed Consent:      Only emergency history available  Plan Discussed with:   Anesthesia Plan Comments:        Anesthesia Quick Evaluation

## 2022-10-02 ENCOUNTER — Inpatient Hospital Stay (HOSPITAL_COMMUNITY): Payer: HMO

## 2022-10-02 ENCOUNTER — Encounter (HOSPITAL_COMMUNITY)
Admission: EM | Disposition: A | Discharge status: Rehabilitation Facility | Payer: Self-pay | Source: Home / Self Care | Attending: Neurology

## 2022-10-02 DIAGNOSIS — D72829 Elevated white blood cell count, unspecified: Secondary | ICD-10-CM | POA: Diagnosis not present

## 2022-10-02 DIAGNOSIS — G9389 Other specified disorders of brain: Secondary | ICD-10-CM | POA: Diagnosis not present

## 2022-10-02 DIAGNOSIS — Z809 Family history of malignant neoplasm, unspecified: Secondary | ICD-10-CM | POA: Diagnosis not present

## 2022-10-02 DIAGNOSIS — I4891 Unspecified atrial fibrillation: Secondary | ICD-10-CM | POA: Diagnosis not present

## 2022-10-02 DIAGNOSIS — H53461 Homonymous bilateral field defects, right side: Secondary | ICD-10-CM | POA: Diagnosis not present

## 2022-10-02 DIAGNOSIS — Z79899 Other long term (current) drug therapy: Secondary | ICD-10-CM | POA: Diagnosis not present

## 2022-10-02 DIAGNOSIS — I6523 Occlusion and stenosis of bilateral carotid arteries: Secondary | ICD-10-CM | POA: Diagnosis not present

## 2022-10-02 DIAGNOSIS — R29810 Facial weakness: Secondary | ICD-10-CM | POA: Diagnosis not present

## 2022-10-02 DIAGNOSIS — Z7984 Long term (current) use of oral hypoglycemic drugs: Secondary | ICD-10-CM | POA: Diagnosis not present

## 2022-10-02 DIAGNOSIS — I63332 Cerebral infarction due to thrombosis of left posterior cerebral artery: Secondary | ICD-10-CM | POA: Diagnosis not present

## 2022-10-02 DIAGNOSIS — E785 Hyperlipidemia, unspecified: Secondary | ICD-10-CM | POA: Diagnosis not present

## 2022-10-02 DIAGNOSIS — I129 Hypertensive chronic kidney disease with stage 1 through stage 4 chronic kidney disease, or unspecified chronic kidney disease: Secondary | ICD-10-CM | POA: Diagnosis not present

## 2022-10-02 DIAGNOSIS — Z7901 Long term (current) use of anticoagulants: Secondary | ICD-10-CM | POA: Diagnosis not present

## 2022-10-02 DIAGNOSIS — H5347 Heteronymous bilateral field defects: Secondary | ICD-10-CM | POA: Diagnosis not present

## 2022-10-02 DIAGNOSIS — R29707 NIHSS score 7: Secondary | ICD-10-CM | POA: Diagnosis not present

## 2022-10-02 DIAGNOSIS — L600 Ingrowing nail: Secondary | ICD-10-CM | POA: Diagnosis not present

## 2022-10-02 DIAGNOSIS — E119 Type 2 diabetes mellitus without complications: Secondary | ICD-10-CM | POA: Diagnosis not present

## 2022-10-02 DIAGNOSIS — Z832 Family history of diseases of the blood and blood-forming organs and certain disorders involving the immune mechanism: Secondary | ICD-10-CM | POA: Diagnosis not present

## 2022-10-02 DIAGNOSIS — Z95828 Presence of other vascular implants and grafts: Secondary | ICD-10-CM | POA: Diagnosis not present

## 2022-10-02 DIAGNOSIS — I6389 Other cerebral infarction: Secondary | ICD-10-CM | POA: Diagnosis not present

## 2022-10-02 DIAGNOSIS — N179 Acute kidney failure, unspecified: Secondary | ICD-10-CM | POA: Diagnosis not present

## 2022-10-02 DIAGNOSIS — I6621 Occlusion and stenosis of right posterior cerebral artery: Secondary | ICD-10-CM | POA: Diagnosis not present

## 2022-10-02 DIAGNOSIS — I480 Paroxysmal atrial fibrillation: Secondary | ICD-10-CM | POA: Diagnosis not present

## 2022-10-02 DIAGNOSIS — N400 Enlarged prostate without lower urinary tract symptoms: Secondary | ICD-10-CM | POA: Diagnosis not present

## 2022-10-02 DIAGNOSIS — I6340 Cerebral infarction due to embolism of unspecified cerebral artery: Secondary | ICD-10-CM | POA: Diagnosis not present

## 2022-10-02 DIAGNOSIS — I639 Cerebral infarction, unspecified: Secondary | ICD-10-CM | POA: Diagnosis present

## 2022-10-02 DIAGNOSIS — F32A Depression, unspecified: Secondary | ICD-10-CM | POA: Diagnosis not present

## 2022-10-02 DIAGNOSIS — N1832 Chronic kidney disease, stage 3b: Secondary | ICD-10-CM | POA: Diagnosis not present

## 2022-10-02 DIAGNOSIS — I69398 Other sequelae of cerebral infarction: Secondary | ICD-10-CM | POA: Diagnosis not present

## 2022-10-02 DIAGNOSIS — E1165 Type 2 diabetes mellitus with hyperglycemia: Secondary | ICD-10-CM | POA: Diagnosis not present

## 2022-10-02 DIAGNOSIS — I2510 Atherosclerotic heart disease of native coronary artery without angina pectoris: Secondary | ICD-10-CM | POA: Diagnosis not present

## 2022-10-02 DIAGNOSIS — I161 Hypertensive emergency: Secondary | ICD-10-CM | POA: Diagnosis not present

## 2022-10-02 DIAGNOSIS — Z8249 Family history of ischemic heart disease and other diseases of the circulatory system: Secondary | ICD-10-CM | POA: Diagnosis not present

## 2022-10-02 DIAGNOSIS — I484 Atypical atrial flutter: Secondary | ICD-10-CM | POA: Diagnosis not present

## 2022-10-02 DIAGNOSIS — E1122 Type 2 diabetes mellitus with diabetic chronic kidney disease: Secondary | ICD-10-CM | POA: Diagnosis not present

## 2022-10-02 DIAGNOSIS — I10 Essential (primary) hypertension: Secondary | ICD-10-CM | POA: Diagnosis not present

## 2022-10-02 DIAGNOSIS — G4700 Insomnia, unspecified: Secondary | ICD-10-CM | POA: Diagnosis not present

## 2022-10-02 DIAGNOSIS — J9811 Atelectasis: Secondary | ICD-10-CM | POA: Diagnosis not present

## 2022-10-02 DIAGNOSIS — I63443 Cerebral infarction due to embolism of bilateral cerebellar arteries: Secondary | ICD-10-CM | POA: Diagnosis not present

## 2022-10-02 DIAGNOSIS — I6349 Cerebral infarction due to embolism of other cerebral artery: Secondary | ICD-10-CM | POA: Diagnosis not present

## 2022-10-02 DIAGNOSIS — Z9889 Other specified postprocedural states: Secondary | ICD-10-CM

## 2022-10-02 DIAGNOSIS — I6381 Other cerebral infarction due to occlusion or stenosis of small artery: Secondary | ICD-10-CM | POA: Diagnosis not present

## 2022-10-02 DIAGNOSIS — I63312 Cerebral infarction due to thrombosis of left middle cerebral artery: Secondary | ICD-10-CM | POA: Diagnosis not present

## 2022-10-02 DIAGNOSIS — H532 Diplopia: Secondary | ICD-10-CM | POA: Diagnosis not present

## 2022-10-02 DIAGNOSIS — Z7982 Long term (current) use of aspirin: Secondary | ICD-10-CM | POA: Diagnosis not present

## 2022-10-02 DIAGNOSIS — F419 Anxiety disorder, unspecified: Secondary | ICD-10-CM | POA: Diagnosis not present

## 2022-10-02 DIAGNOSIS — Z951 Presence of aortocoronary bypass graft: Secondary | ICD-10-CM | POA: Diagnosis not present

## 2022-10-02 DIAGNOSIS — I4892 Unspecified atrial flutter: Secondary | ICD-10-CM | POA: Diagnosis not present

## 2022-10-02 DIAGNOSIS — K5900 Constipation, unspecified: Secondary | ICD-10-CM | POA: Diagnosis not present

## 2022-10-02 HISTORY — PX: RADIOLOGY WITH ANESTHESIA: SHX6223

## 2022-10-02 HISTORY — PX: IR US GUIDE VASC ACCESS RIGHT: IMG2390

## 2022-10-02 HISTORY — PX: IR CT HEAD LTD: IMG2386

## 2022-10-02 HISTORY — PX: IR PERCUTANEOUS ART THROMBECTOMY/INFUSION INTRACRANIAL INC DIAG ANGIO: IMG6087

## 2022-10-02 LAB — ECHOCARDIOGRAM COMPLETE
Area-P 1/2: 4.15 cm2
S' Lateral: 2.4 cm
Weight: 2800 oz

## 2022-10-02 LAB — LIPID PANEL
Cholesterol: 136 mg/dL (ref 0–200)
HDL: 45 mg/dL (ref >40)
LDL Cholesterol: 77 mg/dL (ref 0–99)
Total CHOL/HDL Ratio: 3 RATIO
Triglycerides: 68 mg/dL (ref <150)
VLDL: 14 mg/dL (ref 0–40)

## 2022-10-02 LAB — HEMOGLOBIN A1C
Hgb A1c MFr Bld: 8.4 % — ABNORMAL HIGH (ref 4.8–5.6)
Mean Plasma Glucose: 194.38 mg/dL

## 2022-10-02 LAB — MRSA NEXT GEN BY PCR, NASAL: MRSA by PCR Next Gen: DETECTED — AB

## 2022-10-02 LAB — GLUCOSE, CAPILLARY
Glucose-Capillary: 183 mg/dL — ABNORMAL HIGH (ref 70–99)
Glucose-Capillary: 200 mg/dL — ABNORMAL HIGH (ref 70–99)
Glucose-Capillary: 200 mg/dL — ABNORMAL HIGH (ref 70–99)
Glucose-Capillary: 172 mg/dL — ABNORMAL HIGH (ref 70–99)

## 2022-10-02 SURGERY — IR WITH ANESTHESIA
Anesthesia: General

## 2022-10-02 MED ORDER — EZETIMIBE 10 MG PO TABS
10.0000 mg | ORAL_TABLET | Freq: Every morning | ORAL | Status: DC
  Administered 2022-10-02 – 2022-10-06 (×5): 10 mg via ORAL
  Filled 2022-10-02 (×5): qty 1

## 2022-10-02 MED ORDER — SODIUM CHLORIDE 0.9 % IV SOLN: INTRAVENOUS | Status: DC

## 2022-10-02 MED ORDER — VERAPAMIL HCL 2.5 MG/ML IV SOLN
INTRAVENOUS | Status: AC
  Filled 2022-10-02: qty 2

## 2022-10-02 MED ORDER — CHLORHEXIDINE GLUCONATE CLOTH 2 % EX PADS
6.0000 | MEDICATED_PAD | Freq: Every day | CUTANEOUS | Status: DC
  Administered 2022-10-02: 6 via TOPICAL

## 2022-10-02 MED ORDER — ONDANSETRON HCL 4 MG/2ML IJ SOLN
INTRAMUSCULAR | Status: DC | PRN
  Administered 2022-10-02: 4 mg via INTRAVENOUS

## 2022-10-02 MED ORDER — PERFLUTREN LIPID MICROSPHERE
1.0000 mL | INTRAVENOUS | Status: AC | PRN
  Administered 2022-10-02: 2 mL via INTRAVENOUS

## 2022-10-02 MED ORDER — SUGAMMADEX SODIUM 200 MG/2ML IV SOLN
INTRAVENOUS | Status: DC | PRN
  Administered 2022-10-02: 200 mg via INTRAVENOUS

## 2022-10-02 MED ORDER — INSULIN ASPART 100 UNIT/ML IJ SOLN: 0.0000 [IU] | Freq: Every day | INTRAMUSCULAR | Status: DC

## 2022-10-02 MED ORDER — HEPARIN SODIUM (PORCINE) 1000 UNIT/ML IJ SOLN
INTRAMUSCULAR | Status: AC
  Filled 2022-10-02: qty 10

## 2022-10-02 MED ORDER — SODIUM CHLORIDE 0.9 % IV BOLUS: 250.0000 mL | INTRAVENOUS | Status: DC | PRN

## 2022-10-02 MED ORDER — MELATONIN 3 MG PO TABS: 3.0000 mg | ORAL_TABLET | Freq: Every evening | ORAL | Status: DC | PRN

## 2022-10-02 MED ORDER — ACETAMINOPHEN 160 MG/5ML PO SOLN: 650.0000 mg | ORAL | Status: DC | PRN

## 2022-10-02 MED ORDER — CLOPIDOGREL BISULFATE 75 MG PO TABS
300.0000 mg | ORAL_TABLET | Freq: Once | ORAL | Status: AC
  Administered 2022-10-02: 300 mg via ORAL
  Filled 2022-10-02: qty 4

## 2022-10-02 MED ORDER — STROKE: EARLY STAGES OF RECOVERY BOOK
Freq: Once | Status: AC
  Filled 2022-10-02: qty 1

## 2022-10-02 MED ORDER — MIRABEGRON ER 50 MG PO TB24
50.0000 mg | ORAL_TABLET | Freq: Every day | ORAL | Status: DC
  Administered 2022-10-02: 50 mg via ORAL

## 2022-10-02 MED ORDER — ASPIRIN 81 MG PO TBEC
81.0000 mg | DELAYED_RELEASE_TABLET | Freq: Every day | ORAL | Status: DC
  Administered 2022-10-02 – 2022-10-05 (×4): 81 mg via ORAL
  Filled 2022-10-02 (×4): qty 1

## 2022-10-02 MED ORDER — ACETAMINOPHEN 325 MG PO TABS: 650.0000 mg | ORAL_TABLET | ORAL | Status: DC | PRN

## 2022-10-02 MED ORDER — ONDANSETRON HCL 4 MG/2ML IJ SOLN: 4.0000 mg | Freq: Four times a day (QID) | INTRAMUSCULAR | Status: DC

## 2022-10-02 MED ORDER — PROPOFOL 10 MG/ML IV BOLUS
INTRAVENOUS | Status: DC | PRN
  Administered 2022-10-02: 100 mg via INTRAVENOUS

## 2022-10-02 MED ORDER — IOHEXOL 300 MG/ML  SOLN
150.0000 mL | Freq: Once | INTRAMUSCULAR | Status: AC | PRN
  Administered 2022-10-02: 40 mL via INTRA_ARTERIAL

## 2022-10-02 MED ORDER — ACETAMINOPHEN 650 MG RE SUPP: 650.0000 mg | RECTAL | Status: DC | PRN

## 2022-10-02 MED ORDER — INSULIN ASPART 100 UNIT/ML IJ SOLN: 0.0000 [IU] | Freq: Three times a day (TID) | INTRAMUSCULAR | Status: DC

## 2022-10-02 MED ORDER — SUCCINYLCHOLINE CHLORIDE 200 MG/10ML IV SOSY
PREFILLED_SYRINGE | INTRAVENOUS | Status: DC | PRN
  Administered 2022-10-02: 140 mg via INTRAVENOUS

## 2022-10-02 MED ORDER — MIRABEGRON ER 25 MG PO TB24
50.0000 mg | ORAL_TABLET | Freq: Every day | ORAL | Status: DC
  Administered 2022-10-03 – 2022-10-06 (×4): 50 mg via ORAL
  Filled 2022-10-02: qty 2
  Filled 2022-10-02: qty 1
  Filled 2022-10-02 (×2): qty 2
  Filled 2022-10-02: qty 1

## 2022-10-02 MED ORDER — ORAL CARE MOUTH RINSE: 15.0000 mL | OROMUCOSAL | Status: DC | PRN

## 2022-10-02 MED ORDER — LACTATED RINGERS IV SOLN: INTRAVENOUS | Status: DC | PRN

## 2022-10-02 MED ORDER — TADALAFIL 5 MG PO TABS
5.0000 mg | ORAL_TABLET | Freq: Every day | ORAL | Status: DC
  Administered 2022-10-02 – 2022-10-06 (×5): 5 mg via ORAL
  Filled 2022-10-02 (×5): qty 1

## 2022-10-02 MED ORDER — ONDANSETRON HCL 4 MG/2ML IJ SOLN
INTRAMUSCULAR | Status: AC
  Administered 2022-10-02: 4 mg via INTRAVENOUS
  Filled 2022-10-02: qty 2

## 2022-10-02 MED ORDER — NITROGLYCERIN 1 MG/10 ML FOR IR/CATH LAB
INTRA_ARTERIAL | Status: AC
  Filled 2022-10-02: qty 10

## 2022-10-02 MED ORDER — CLEVIDIPINE BUTYRATE 0.5 MG/ML IV EMUL
INTRAVENOUS | Status: AC
  Filled 2022-10-02: qty 50

## 2022-10-02 MED ORDER — CLEVIDIPINE BUTYRATE 0.5 MG/ML IV EMUL
INTRAVENOUS | Status: DC | PRN
  Administered 2022-10-02: 2 mg/h via INTRAVENOUS

## 2022-10-02 MED ORDER — LIDOCAINE HCL 1 % IJ SOLN
INTRAMUSCULAR | Status: AC
  Filled 2022-10-02: qty 20

## 2022-10-02 MED ORDER — SODIUM CHLORIDE (PF) 0.9 % IJ SOLN
INTRAVENOUS | Status: DC | PRN
  Administered 2022-10-02: 200 ug via INTRA_ARTERIAL

## 2022-10-02 MED ORDER — ACETAMINOPHEN 325 MG PO TABS
650.0000 mg | ORAL_TABLET | ORAL | Status: DC | PRN
  Administered 2022-10-02 – 2022-10-04 (×8): 650 mg via ORAL
  Filled 2022-10-02 (×8): qty 2

## 2022-10-02 MED ORDER — CLOPIDOGREL BISULFATE 75 MG PO TABS
75.0000 mg | ORAL_TABLET | Freq: Every day | ORAL | Status: DC
  Administered 2022-10-03 – 2022-10-06 (×4): 75 mg via ORAL
  Filled 2022-10-02 (×4): qty 1

## 2022-10-02 MED ORDER — TAMSULOSIN HCL 0.4 MG PO CAPS: 0.4000 mg | ORAL_CAPSULE | Freq: Every day | ORAL | Status: DC

## 2022-10-02 MED ORDER — VERAPAMIL HCL 2.5 MG/ML IV SOLN
INTRAVENOUS | Status: DC | PRN
  Administered 2022-10-02: 5 mg via INTRA_ARTERIAL

## 2022-10-02 MED ORDER — ATORVASTATIN CALCIUM 80 MG PO TABS
80.0000 mg | ORAL_TABLET | Freq: Every day | ORAL | Status: DC
  Administered 2022-10-02 – 2022-10-06 (×5): 80 mg via ORAL
  Filled 2022-10-02 (×5): qty 1

## 2022-10-02 MED ORDER — INSULIN ASPART 100 UNIT/ML IJ SOLN
0.0000 [IU] | Freq: Three times a day (TID) | INTRAMUSCULAR | Status: DC
  Administered 2022-10-02 – 2022-10-03 (×3): 2 [IU] via SUBCUTANEOUS

## 2022-10-02 MED ORDER — ROCURONIUM BROMIDE 10 MG/ML (PF) SYRINGE
PREFILLED_SYRINGE | INTRAVENOUS | Status: DC | PRN
  Administered 2022-10-02: 50 mg via INTRAVENOUS

## 2022-10-02 MED ORDER — MUPIROCIN 2 % EX OINT
1.0000 | TOPICAL_OINTMENT | Freq: Two times a day (BID) | CUTANEOUS | Status: AC
  Administered 2022-10-02 – 2022-10-06 (×10): 1 via NASAL
  Filled 2022-10-02 (×2): qty 22

## 2022-10-02 MED ORDER — TAMSULOSIN HCL 0.4 MG PO CAPS
0.4000 mg | ORAL_CAPSULE | Freq: Every day | ORAL | Status: DC
  Administered 2022-10-02 – 2022-10-05 (×4): 0.4 mg via ORAL
  Filled 2022-10-02 (×4): qty 1

## 2022-10-02 MED ORDER — PHENYLEPHRINE HCL-NACL 20-0.9 MG/250ML-% IV SOLN
INTRAVENOUS | Status: DC | PRN
  Administered 2022-10-02: 50 ug/min via INTRAVENOUS

## 2022-10-02 MED ORDER — CLEVIDIPINE BUTYRATE 0.5 MG/ML IV EMUL
0.0000 mg/h | INTRAVENOUS | Status: DC
  Administered 2022-10-02: 2 mg/h via INTRAVENOUS

## 2022-10-02 MED ORDER — ONDANSETRON HCL 4 MG/2ML IJ SOLN
4.0000 mg | Freq: Four times a day (QID) | INTRAMUSCULAR | Status: DC | PRN
  Administered 2022-10-03: 4 mg via INTRAVENOUS
  Filled 2022-10-02: qty 2

## 2022-10-02 MED ORDER — HEPARIN (PORCINE) 25000 UT/250ML-% IV SOLN
950.0000 [IU]/h | INTRAVENOUS | Status: DC
  Administered 2022-10-02: 800 [IU]/h via INTRAVENOUS
  Administered 2022-10-04: 950 [IU]/h via INTRAVENOUS
  Filled 2022-10-02 (×2): qty 250

## 2022-10-02 NOTE — Evaluation (Signed)
Speech Language Pathology Evaluation Patient Details Name: [name] MRN: [id number] DOB: 06-02-1934 Today's Date: 10/02/2022 Time: 5621-3086 SLP Time Calculation (min) (ACUTE ONLY): 31 min  Problem List:  Patient Active Problem List   Diagnosis Date Noted   Status post surgery 10/02/2022   Stroke (cerebrum) (HCC) 10/02/2022   Hemianopsia 10/01/2022   Right homonymous hemianopsia 10/01/2022   Loop recorder: Biotronic Biomonitor II loop 07/28/2021 07/28/2021   Unsteady gait 08/05/2020   Anxiety disorder 02/02/2018   Right hip pain 10/02/2017   Recurrent major depression in remission (HCC) 01/27/2017   Chronic kidney disease, stage 3a (HCC) 07/17/2015   Personal history of colonic adenomas 03/26/2013   Obesity 10/04/2012   OBSTRUCTIVE SLEEP APNEA 10/30/2008   HYPERLIPIDEMIA-MIXED 07/01/2008   HYPERTENSION, BENIGN 07/01/2008   CAD, NATIVE VESSEL 07/01/2008   FATIGUE / MALAISE 07/01/2008   EDEMA 07/01/2008   Past Medical History:  Past Medical History:  Diagnosis Date   Acute renal failure (HCC)    secondary to nephrotic syndrome post cath, resolved with steroids   Anxiety    CAD (coronary artery disease) 04/2006   LIMA to LAD, sequential SVG to first, second, and third OM's, sequential SVG to mid RCA and PDA    Depression    Diabetes mellitus without complication (HCC)    ED (erectile dysfunction)    HTN (hypertension)    Hx of adenomatous colonic polyps    Hyperlipidemia    Loop recorder: Biotronic Biomonitor II loop 07/28/2021 07/28/2021   Obesity    Past Surgical History:  Past Surgical History:  Procedure Laterality Date   COLONOSCOPY W/ BIOPSIES AND POLYPECTOMY     CORONARY ARTERY BYPASS GRAFT  2008   VESSELS X6   LOOP RECORDER IMPLANT     UMBILICAL HERNIA REPAIR     HPI:  Pt is an 88 y.o. male who presented with vision changes. CTA/CTP revealed P1 occlusion with significant penumbra. Pt s/p mechanical thrombectomy 6/16. MRI brain 6/16: Small acute  infarcts scattered in the bilateral cerebellum (left greater than right). Punctate acute lacunar infarcts in both the left pons and thalamus. PMH: diabetes, hypertension, hyperlipidemia.   Assessment / Plan / Recommendation Clinical Impression  Pt participated in speech-language-cognition evaluation with his daughter present. Pt reported that he lives alone, completed some college-level coursework, and that he is a retired Surveyor, [employer]. Pt denied any baseline deficits or acute changes in speech, language, or cognition. Pt's daughter reported that the pt has some difficulty with memory related to aging, but that he was fully independent prior- doing long-distance driving, and managing his medications and finances. Pt was adequately alert during the evaluation, but reported fatigue and fell asleep when auditory/tactile stimulation was removed; the impact of this on his performance is considered. [facility] Mental Status Examination was completed to evaluate the pt's cognitive-linguistic skills. His score was adjusted to exclude the task with the shapes since pt stated he could not visualize them. Pt achieved an adjusted score of 16/28 which is below the normal limits of 27 or more. He exhibited deficits in the areas of attention, memory, orientation, and executive function as it relates to complex problem solving, awareness, and mental flexibility. His motor speech and language skills were WFL. Skilled SLP services are clinically indicated at this time to improve cognitive-linguistic function.    SLP Assessment  SLP Recommendation/Assessment: Patient needs continued Speech Lanaguage Pathology Services SLP Visit Diagnosis: Cognitive communication deficit (R41.841)    Recommendations for follow up therapy are one  component of a multi-disciplinary discharge planning process, led by the attending physician.  Recommendations may be updated based on patient status, additional functional criteria and  insurance authorization.    Follow Up Recommendations   (Continued SLP services at level of care recommended by PT/OT)    Assistance Recommended at Discharge  Frequent or constant Supervision/Assistance  Functional Status Assessment Patient has had a recent decline in their functional status and demonstrates the ability to make significant improvements in function in a reasonable and predictable amount of time.  Frequency and Duration min 2x/week  2 weeks      SLP Evaluation Cognition  Overall Cognitive Status: Impaired/Different from baseline Arousal/Alertness: Awake/alert Orientation Level: Oriented to person;Disoriented to situation;Disoriented to time;Oriented to place Year: 2024 Month: May (changed to March with repetition) Day of Week: Correct Attention: Sustained;Selective Sustained Attention: Impaired Sustained Attention Impairment: Verbal complex Memory: Impaired Memory Impairment:  (Immediate: 5/5 with repetition x2; delayed: 2/5 with cues: 2/3) Awareness: Impaired Awareness Impairment: Intellectual impairment Problem Solving: Impaired Problem Solving Impairment: Verbal complex (Money: 0/3; time: 0/2) Executive Function: Sequencing;Organizing Sequencing: Impaired Sequencing Impairment: Verbal complex (clock: 0/4) Organizing: Impaired Organizing Impairment: Verbal complex (backward digit span: 1/2)       Comprehension  Auditory Comprehension Overall Auditory Comprehension: Appears within functional limits for tasks assessed Yes/No Questions: Within Functional Limits Commands: Within Functional Limits    Expression Expression Primary Mode of Expression: Verbal Verbal Expression Overall Verbal Expression: Appears within functional limits for tasks assessed Initiation: No impairment Level of Generative/Spontaneous Verbalization: Conversation Repetition: No impairment Naming: No impairment Pragmatics: No impairment   Oral / Motor  Oral Motor/Sensory  Function Overall Oral Motor/Sensory Function: Within functional limits Motor Speech Overall Motor Speech: Appears within functional limits for tasks assessed Respiration: Within functional limits Phonation: Normal Resonance: Within functional limits Articulation: Within functional limitis Intelligibility: Intelligible Motor Planning: Witnin functional limits    Jarely Juncaj I. Vear Clock, MS, CCC-SLP Neuro Diagnostic Specialist  Acute Rehabilitation Services Office number: [phone]   Scheryl Marten 10/02/2022, 11:28 AM

## 2022-10-02 NOTE — Anesthesia Procedure Notes (Addendum)
Procedure Name: Intubation Date/Time: 10/02/2022 12:07 AM  Performed by: Edmonia Caprio, CRNAPre-anesthesia Checklist: Patient identified, Emergency Drugs available, Suction available and Patient being monitored Patient Re-evaluated:Patient Re-evaluated prior to induction Oxygen Delivery Method: Circle system utilized Preoxygenation: Pre-oxygenation with 100% oxygen Induction Type: IV induction Ventilation: Mask ventilation without difficulty Laryngoscope Size: Miller and 2 Grade View: Grade I Tube type: Oral Tube size: 7.5 mm Number of attempts: 1 Airway Equipment and Method: Stylet and Oral airway Placement Confirmation: ETT inserted through vocal cords under direct vision, positive ETCO2 and breath sounds checked- equal and bilateral Secured at: 23 cm Tube secured with: Tape Dental Injury: Teeth and Oropharynx as per pre-operative assessment

## 2022-10-02 NOTE — Progress Notes (Addendum)
STROKE TEAM PROGRESS NOTE   INTERVAL HISTORY No family at bedside.  He is awake alert and oriented in no apparent distress. Patient developed diplopia yesterday evening however did not realize that he had a complete hemianopia when he was evaluated.  Last known well was unclear so he did not receive TNK however went to IR for thrombectomy of occlusion of left P1/PCA with TICI 3 He was on Cleviprex at 2 mg  Vitals:   10/02/22 0630 10/02/22 0700 10/02/22 0715 10/02/22 0800  BP: (!) 163/87 (!) 146/73 136/68   Pulse: 72 75 74   Resp: 18 20 18    Temp:    97.9 F (36.6 C)  TempSrc:    Oral  SpO2: 96% 96% 90%   Weight:       CBC:  Recent Labs  Lab 10/01/22 2057  WBC 7.5  NEUTROABS 4.1  HGB 14.9  HCT 43.8  MCV 95.2  PLT 180   Basic Metabolic Panel:  Recent Labs  Lab 10/01/22 2057  NA 136  K 4.0  CL 99  CO2 28  GLUCOSE 169*  BUN 28*  CREATININE 1.39*  CALCIUM 9.9   Lipid Panel:  Recent Labs  Lab 10/02/22 0227  CHOL 136  TRIG 68  HDL 45  CHOLHDL 3.0  VLDL 14  LDLCALC 77   HgbA1c:  Recent Labs  Lab 10/02/22 0227  HGBA1C 8.4*   Urine Drug Screen:  Recent Labs  Lab 10/01/22 2225  LABOPIA NONE DETECTED  COCAINSCRNUR NONE DETECTED  LABBENZ NONE DETECTED  AMPHETMU NONE DETECTED  THCU NONE DETECTED  LABBARB NONE DETECTED    Alcohol Level  Recent Labs  Lab 10/01/22 2057  ETH <10    IMAGING past 24 hours MR BRAIN WO CONTRAST  Result Date: 10/02/2022 CLINICAL DATA:  88 year old male code stroke presentation yesterday, short segment left P1 occlusion and clot protruding into the distal basilar artery. Status post mechanical thrombectomy. EXAM: MRI HEAD WITHOUT CONTRAST TECHNIQUE: Multiplanar, multiecho pulse sequences of the brain and surrounding structures were obtained without intravenous contrast. COMPARISON:  CT head, CTA and CTP yesterday. FINDINGS: Brain: Patchy restricted diffusion in both cerebellar hemispheres, more so on the left at the AICA and/or  SCA territories (series 2, image 15). Subtle small focus of restricted diffusion in the left superior pons just below the midbrain best seen on series 3, image 17. No other convincing brainstem restricted diffusion. Similar small focus in the left lateral thalamus series 2, image 28. And there is an adjacent chronic left thalamic lacunar infarct. DWI edge artifact throughout the cerebral hemispheres, but there could be small foci of abnormal occipital pole diffusion, possibly also some right occipital white matter diffusion restriction on series 2, image 19. Subtle T2/FLAIR hyperintensity at the affected areas. No hemorrhagic transformation or mass effect. Nonspecific ventriculomegaly which might be cerebral volume loss related. Nomidline shift, mass effect, evidence of mass lesion, ventriculomegaly, extra-axial collection or acute intracranial hemorrhage. Cervicomedullary junction and pituitary are within normal limits. Patchy and confluent bilateral cerebral white matter T2 and FLAIR hyperintensity. Chronic left thalamic lacunar infarct as stated above, otherwise deep gray nuclei T2 heterogeneity appears mostly due to perivascular spaces. Chronic microhemorrhages also in both thalami on SWI, and in the right corona radiata. Vascular: Major intracranial vascular flow voids are preserved. Skull and upper cervical spine: Negative for age visible cervical spine. Visualized bone marrow signal is within normal limits. Sinuses/Orbits: Postoperative changes to both globes. Paranasal sinus mucoperiosteal thickening again noted. Other: Mastoids are  well aerated. Grossly normal visible internal auditory structures. IMPRESSION: 1. Small acute infarcts scattered in the bilateral cerebellum (left greater than right). Punctate acute lacunar infarcts in both the left pons and thalamus. And minimal occipital lobe restricted diffusion, perhaps greater on the right. 2. No hemorrhagic transformation or intracranial mass effect. 3.  Underlying chronic small vessel disease, including chronic thalamic microhemorrhages, and nonspecific ventricular enlargement. Electronically Signed   By: Odessa Fleming M.D.   On: 10/02/2022 05:46   CT CEREBRAL PERFUSION W CONTRAST  Result Date: 10/01/2022 CLINICAL DATA:  Diplopia, stroke suspected EXAM: CT ANGIOGRAPHY HEAD AND NECK CT PERFUSION BRAIN TECHNIQUE: Multidetector CT imaging of the head and neck was performed using the standard protocol during bolus administration of intravenous contrast. Multiplanar CT image reconstructions and MIPs were obtained to evaluate the vascular anatomy. Carotid stenosis measurements (when applicable) are obtained utilizing NASCET criteria, using the distal internal carotid diameter as the denominator. Multiphase CT imaging of the brain was performed following IV bolus contrast injection. Subsequent parametric perfusion maps were calculated using RAPID software. RADIATION DOSE REDUCTION: This exam was performed according to the departmental dose-optimization program which includes automated exposure control, adjustment of the mA and/or kV according to patient size and/or use of iterative reconstruction technique. CONTRAST:  OMNIPAQUE IOHEXOL 350 MG/ML SOLN COMPARISON:  No prior CTA available, correlation is made with CT head 10/01/2022 FINDINGS: CT HEAD FINDINGS For noncontrast findings, please see same day CT head. CTA NECK FINDINGS Aortic arch: Four-vessel arch, with the left vertebral artery originating from the aorta. Imaged portion shows no evidence of aneurysm or dissection. No significant stenosis of the major arch vessel origins. Aortic atherosclerosis, with significant noncalcified and possibly ulcerated plaque. Right carotid system: No evidence of dissection, occlusion, or hemodynamically significant stenosis (greater than 50%). Retropharyngeal course of the proximal right ICA. Left carotid system: No evidence of dissection, occlusion, or hemodynamically  significant stenosis (greater than 50%). Vertebral arteries: No evidence of dissection, occlusion, or hemodynamically significant stenosis (greater than 50%). Skeleton: No acute osseous abnormality. Degenerative changes in the cervical spine. Status post median sternotomy. Other neck: No acute finding. Upper chest: No focal pulmonary opacity or pleural effusion. Review of the MIP images confirms the above findings CTA HEAD FINDINGS Anterior circulation: Both internal carotid arteries are patent to the termini, with mild stenosis in the left cavernous segment, severe stenosis in the left supraclinoid segment proximally, and moderate stenosis in the right supraclinoid segment. A1 segments patent. Normal anterior communicating artery. Anterior cerebral arteries are patent to their distal aspects without significant stenosis. No M1 stenosis or occlusion. MCA branches perfused to their distal aspects without significant stenosis. Posterior circulation: Vertebral arteries patent to the vertebrobasilar junction without significant stenosis. Severe stenosis at the origin of the right PICA (series 7, image 136). The left PICA is patent. Basilar patent to its distal aspect with mild stenosis in the distal basilar (series 7, image 120). Superior cerebellar arteries patent proximally. Short segment occlusion of the proximal left P 1 segment (series 7, image 108). Additional moderate stenosis in the distal left P2 segment (series 7, image 112). Patent right P1 segment. The right PCA branches are patent to to their distal aspects with some irregularity but without significant stenosis. The right posterior communicating artery is patent. The left posterior communicating artery is not visualized. Venous sinuses: As permitted by contrast timing, patent. Anatomic variants: None significant. Review of the MIP images confirms the above findings CT Brain Perfusion Findings: ASPECTS: 10 CBF (<30%)  Volume: 0mL Perfusion (Tmax>6.0s)  volume: 19mL Mismatch Volume: 19mL Infarction Location:No infarct core identified. Possible area of decreased perfusion in the superior left cerebellum, along the posterior and lateral aspects, or left occipital lobe. IMPRESSION: 1. Short segment occlusion of the proximal left P1 segment, with additional moderate stenosis in the distal left P2 segment. 2. No infarct core identified. Possible area of decreased perfusion in the superior left cerebellum or left occipital lobe. This could reflect decreased perfusion related to short segment left P1 occlusion. Superior cerebellar decreased perfusion 3. Severe stenosis in the left supraclinoid ICA and moderate stenosis in the right supraclinoid ICA. 4. Severe stenosis at the origin of the right PICA. Mild stenosis in the distal basilar artery. 5. No hemodynamically significant stenosis in the neck. 6. Aortic atherosclerosis. Aortic Atherosclerosis (ICD10-I70.0). These results were called by telephone at the time of interpretation on 10/01/2022 at 10:59 pm to provider [facility] , who verbally acknowledged these results. Electronically Signed   By: Wiliam Ke M.D.   On: 10/01/2022 22:59   CT ANGIO HEAD NECK W WO CM  Result Date: 10/01/2022 CLINICAL DATA:  Diplopia, stroke suspected EXAM: CT ANGIOGRAPHY HEAD AND NECK CT PERFUSION BRAIN TECHNIQUE: Multidetector CT imaging of the head and neck was performed using the standard protocol during bolus administration of intravenous contrast. Multiplanar CT image reconstructions and MIPs were obtained to evaluate the vascular anatomy. Carotid stenosis measurements (when applicable) are obtained utilizing NASCET criteria, using the distal internal carotid diameter as the denominator. Multiphase CT imaging of the brain was performed following IV bolus contrast injection. Subsequent parametric perfusion maps were calculated using RAPID software. RADIATION DOSE REDUCTION: This exam was performed according to the  departmental dose-optimization program which includes automated exposure control, adjustment of the mA and/or kV according to patient size and/or use of iterative reconstruction technique. CONTRAST:  OMNIPAQUE IOHEXOL 350 MG/ML SOLN COMPARISON:  No prior CTA available, correlation is made with CT head 10/01/2022 FINDINGS: CT HEAD FINDINGS For noncontrast findings, please see same day CT head. CTA NECK FINDINGS Aortic arch: Four-vessel arch, with the left vertebral artery originating from the aorta. Imaged portion shows no evidence of aneurysm or dissection. No significant stenosis of the major arch vessel origins. Aortic atherosclerosis, with significant noncalcified and possibly ulcerated plaque. Right carotid system: No evidence of dissection, occlusion, or hemodynamically significant stenosis (greater than 50%). Retropharyngeal course of the proximal right ICA. Left carotid system: No evidence of dissection, occlusion, or hemodynamically significant stenosis (greater than 50%). Vertebral arteries: No evidence of dissection, occlusion, or hemodynamically significant stenosis (greater than 50%). Skeleton: No acute osseous abnormality. Degenerative changes in the cervical spine. Status post median sternotomy. Other neck: No acute finding. Upper chest: No focal pulmonary opacity or pleural effusion. Review of the MIP images confirms the above findings CTA HEAD FINDINGS Anterior circulation: Both internal carotid arteries are patent to the termini, with mild stenosis in the left cavernous segment, severe stenosis in the left supraclinoid segment proximally, and moderate stenosis in the right supraclinoid segment. A1 segments patent. Normal anterior communicating artery. Anterior cerebral arteries are patent to their distal aspects without significant stenosis. No M1 stenosis or occlusion. MCA branches perfused to their distal aspects without significant stenosis. Posterior circulation: Vertebral arteries patent  to the vertebrobasilar junction without significant stenosis. Severe stenosis at the origin of the right PICA (series 7, image 136). The left PICA is patent. Basilar patent to its distal aspect with mild stenosis in the distal basilar (series 7,  image 120). Superior cerebellar arteries patent proximally. Short segment occlusion of the proximal left P 1 segment (series 7, image 108). Additional moderate stenosis in the distal left P2 segment (series 7, image 112). Patent right P1 segment. The right PCA branches are patent to to their distal aspects with some irregularity but without significant stenosis. The right posterior communicating artery is patent. The left posterior communicating artery is not visualized. Venous sinuses: As permitted by contrast timing, patent. Anatomic variants: None significant. Review of the MIP images confirms the above findings CT Brain Perfusion Findings: ASPECTS: 10 CBF (<30%) Volume: 0mL Perfusion (Tmax>6.0s) volume: 19mL Mismatch Volume: 19mL Infarction Location:No infarct core identified. Possible area of decreased perfusion in the superior left cerebellum, along the posterior and lateral aspects, or left occipital lobe. IMPRESSION: 1. Short segment occlusion of the proximal left P1 segment, with additional moderate stenosis in the distal left P2 segment. 2. No infarct core identified. Possible area of decreased perfusion in the superior left cerebellum or left occipital lobe. This could reflect decreased perfusion related to short segment left P1 occlusion. Superior cerebellar decreased perfusion 3. Severe stenosis in the left supraclinoid ICA and moderate stenosis in the right supraclinoid ICA. 4. Severe stenosis at the origin of the right PICA. Mild stenosis in the distal basilar artery. 5. No hemodynamically significant stenosis in the neck. 6. Aortic atherosclerosis. Aortic Atherosclerosis (ICD10-I70.0). These results were called by telephone at the time of interpretation on  10/01/2022 at 10:59 pm to provider [facility] , who verbally acknowledged these results. Electronically Signed   By: Wiliam Ke M.D.   On: 10/01/2022 22:59   CT HEAD CODE STROKE WO CONTRAST  Result Date: 10/01/2022 CLINICAL DATA:  Code stroke.  Double vision EXAM: CT HEAD WITHOUT CONTRAST TECHNIQUE: Contiguous axial images were obtained from the base of the skull through the vertex without intravenous contrast. RADIATION DOSE REDUCTION: This exam was performed according to the departmental dose-optimization program which includes automated exposure control, adjustment of the mA and/or kV according to patient size and/or use of iterative reconstruction technique. COMPARISON:  03/11/2017 FINDINGS: Brain: No evidence of acute infarction, hemorrhage, mass, mass effect, or midline shift. No hydrocephalus or extra-axial collection. Age related cerebral atrophy. Ex vacuo dilatation of the ventricles. Periventricular white matter changes, likely the sequela of chronic small vessel ischemic disease. Vascular: No hyperdense vessel. Skull: Negative for fracture or focal lesion. Sinuses/Orbits: Chronic bilateral maxillary sinusitis. Mucosal thickening in the ethmoid air cells. Status post bilateral lens replacements. Dysconjugate gaze Other: The mastoid air cells are well aerated. ASPECTS [facility] Stroke Program Early CT Score) - Ganglionic level infarction (caudate, lentiform nuclei, internal capsule, insula, M1-M3 cortex): 7 - Supraganglionic infarction (M4-M6 cortex): 3 Total score (0-10 with 10 being normal): 10 IMPRESSION: 1. No acute intracranial process. 2. ASPECTS is 10. Code stroke imaging results were communicated on 10/01/2022 at 9:23 pm to provider [facility] via telephone, who verbally acknowledged these results. Electronically Signed   By: Wiliam Ke M.D.   On: 10/01/2022 21:24    PHYSICAL EXAM  Temp:  [97.9 F (36.6 C)-98.7 F (37.1 C)] 97.9 F (36.6 C) (06/16 0800) Pulse Rate:  [69-78] 74  (06/16 0715) Resp:  [12-25] 18 (06/16 0715) BP: (123-203)/(68-110) 136/68 (06/16 0715) SpO2:  [84 %-100 %] 90 % (06/16 0715) Weight:  [79.4 kg] 79.4 kg (06/15 2043)  General - Well nourished, well developed, in no apparent distress. Cardiovascular - Regular rhythm and rate.  Mental Status -  Level of arousal and  orientation to self and age unable to tell me correct month Language including expression, naming, repetition, comprehension was assessed and found intact. Attention span and concentration were normal. Recent and remote memory were intact. Fund of Knowledge was assessed and was intact.  Cranial Nerves II - XII - II - Visual field left hemianopia III, IV, VI - Extraocular movements intact. V - Facial sensation intact bilaterally. VII - Facial movement intact bilaterally. VIII - Hearing & vestibular intact bilaterally. X - Palate elevates symmetrically. XI - Chin turning & shoulder shrug intact bilaterally. XII - Tongue protrusion intact.  Motor Strength - The patient's strength was normal in all extremities and pronator drift was absent.  Bulk was normal and fasciculations were absent.   Motor Tone - Muscle tone was assessed at the neck and appendages and was normal  Sensory - Light touch, temperature/pinprick were assessed and were symmetrical.    Coordination - The patient had normal movements in the hands and feet with no ataxia or dysmetria.  Tremor was absent.  Gait and Station - deferred.  ASSESSMENT/PLAN Mr. ELIOT MALBURG is a 88 y.o. male with history of CAD, hypertension, hyperlipidemia, s/p loop recorder, diabetes, anxiety and depression presenting with diplopia.   Stroke: Multiple scattered bilateral ischemic infarcts s/p thrombectomy of left P1/PCA TICI 3 Etiology: Likely cardioembolic Code Stroke  CT head No acute abnormality. ASPECTS 10.    CTA head & neck Short segment occlusion of the proximal left P1 segment, with additional moderate stenosis in the  distal left P2 segment. Severe stenosis at the origin of the right PICA. Mild stenosis in the distal basilar artery. CT perfusion no core infarct Cerebral angio Proximal occlusion of the left P1/PCA with clot protruding into the distal basilar artery. Mechanical thrombectomy performed with direct contact aspiration with complete recanalization after one pass (TICI3)  Post IR CT no hemorrhage MRI  Small acute infarcts scattered in the bilateral cerebellum (left greater than right). Punctate acute lacunar infarcts in both the left pons and thalamus. And minimal occipital lobe restricted diffusion, perhaps greater on the right. 2D Echo pending LDL 77 HgbA1c 8.4 VTE prophylaxis -SCDs    Diet   Diet Carb Modified Fluid consistency: Thin; Room service appropriate? Yes   Aspirin 81 mg prior to admission, now on on no antithrombotics.  Therapy recommendations: Pending Disposition: Pending  Hypertensive emergency Home meds: Acebutolol, olmesartan/hydrochlorothiazide UNStable Cleviprex at 2 mg SBP goal 1 20-1 40 S/P thrombectomy for 24 hours Long-term BP goal normotensive  Hyperlipidemia Home meds: Atorvastatin 40, resumed in hospital LDL 77, goal < 70 Continue statin at discharge  Diabetes type II UnControlled Home meds: Metformin, Jardiance HgbA1c 8.4, goal < 7.0 CBGs Recent Labs    10/01/22 2052 10/02/22 0132  GLUCAP 184* 183*    SSI Will need close PCP follow-up for diabetes management  Other Stroke Risk Factors Advanced Age >/= 65  Coronary artery disease  Other Active Problems ACute kidney injury-CR 1.39-continue IV fluids BPH restart home meds  Hospital day # 0  Gevena Mart DNP, ACNPC-AG  [facility]    This patient is critically ill due to bilateral posterior circulation stroke status post thrombectomy and at significant risk of neurological worsening, death form complication of IR. This patient's care requires constant monitoring of vital signs,  hemodynamics, respiratory and cardiac monitoring, review of multiple databases, neurological assessment, discussion with family, other specialists and medical decision making of high complexity. I spent 35 minutes of neurocritical care time in the care  of this patient. He is awake, alert, somewhat restless and answering all questions appropriately. He is moving all 4 extremities with normal strength, sensation to light touch intact.   Windell Norfolk, MD Neurology      To contact Stroke Continuity provider, please refer to [email or web address]. After hours, contact General Neurology

## 2022-10-02 NOTE — Transfer of Care (Signed)
Immediate Anesthesia Transfer of Care Note  Patient: [name]  Procedure(s) Performed: IR WITH ANESTHESIA  Patient Location: ICU  Anesthesia Type:General  Level of Consciousness: awake, alert , and confused  Airway & Oxygen Therapy: Patient Spontanous Breathing and Patient connected to face mask oxygen  Post-op Assessment: Report given to RN, Post -op Vital signs reviewed and stable, and Patient moving all extremities X 4  Post vital signs: Reviewed and stable  Last Vitals:  Vitals Value Taken Time  BP 141/64 10/02/22 0125  Temp    Pulse 70 10/02/22 0137  Resp 21 10/02/22 0137  SpO2 94 % 10/02/22 0137  Vitals shown include unvalidated device data.  Last Pain:  Vitals:   10/01/22 2110  TempSrc: Oral  PainSc:          Complications: No notable events documented.

## 2022-10-02 NOTE — Progress Notes (Signed)
Referring Physician(s): Code Stroke  Supervising Physician: Baldemar Lenis  Patient Status:  [facility] - In-pt  Chief Complaint:  Left PCA occlusion s/p mechanical thrombectomy   Subjective:  Patient lying in bed stating he feels somewhat better than yesterday. He reports a mild headache, but states he otherwise "feels fine." Patient reports he had minor bleeding from his right wrist earlier this morning, but it stopped after pressure was held by [employer].  Allergies: Ketorolac tromethamine  Medications: Prior to Admission medications   Medication Sig Start Date End Date Taking? Authorizing Provider  acebutolol (SECTRAL) 200 MG capsule Take 1 capsule by mouth twice daily 03/16/22  Yes Yates Decamp, MD  aspirin EC 81 MG tablet Take 81 mg by mouth daily.   Yes [provider]  atorvastatin (LIPITOR) 40 MG tablet Take 40 mg by mouth daily.   Yes [provider]  furosemide (LASIX) 20 MG tablet Take 1 tablet by mouth daily as needed. Take in morning 12/21/10  Yes Bensimhon, [name], MD  JARDIANCE 25 MG TABS tablet Take 1 tablet by mouth every evening. 02/28/15  Yes [provider]  metFORMIN (GLUCOPHAGE) 1000 MG tablet 1 pill twice a day 12/28/19  Yes [provider]  MYRBETRIQ 50 MG TB24 tablet  06/08/20  Yes [provider]  olmesartan-hydrochlorothiazide (BENICAR HCT) 40-25 MG tablet TAKE 1 TABLET BY MOUTH ONCE DAILY IN THE MORNING 06/13/22  Yes Yates Decamp, MD  tamsulosin (FLOMAX) 0.4 MG CAPS capsule Take 1 capsule by mouth daily. 09/18/19  Yes [provider]  ZETIA 10 MG tablet Take 1 tablet by mouth every morning. 03/04/15  Yes [provider]     Vital Signs: BP 136/68   Pulse 74   Temp 97.9 F (36.6 C) (Oral)   Resp 18   Wt 175 lb (79.4 kg)   SpO2 90%   BMI 25.84 kg/m   Physical Exam Alert, awake, and oriented x3 Speech and comprehension intact Visual field deficit on left. Left hemianopsia No facial  asymmetry. Tongue midline  Motor power 5/5 in all extremities No pronator drift. Fine motor and coordination intact Gait not assessed Patient with pressure dressing over right radial artery puncture site, no bleeding observed at time of exam. Capillary refill <2 seconds of right fingers   Imaging: MR BRAIN WO CONTRAST  Result Date: 10/02/2022 CLINICAL DATA:  88 year old male code stroke presentation yesterday, short segment left P1 occlusion and clot protruding into the distal basilar artery. Status post mechanical thrombectomy. EXAM: MRI HEAD WITHOUT CONTRAST TECHNIQUE: Multiplanar, multiecho pulse sequences of the brain and surrounding structures were obtained without intravenous contrast. COMPARISON:  CT head, CTA and CTP yesterday. FINDINGS: Brain: Patchy restricted diffusion in both cerebellar hemispheres, more so on the left at the AICA and/or SCA territories (series 2, image 15). Subtle small focus of restricted diffusion in the left superior pons just below the midbrain best seen on series 3, image 17. No other convincing brainstem restricted diffusion. Similar small focus in the left lateral thalamus series 2, image 28. And there is an adjacent chronic left thalamic lacunar infarct. DWI edge artifact throughout the cerebral hemispheres, but there could be small foci of abnormal occipital pole diffusion, possibly also some right occipital white matter diffusion restriction on series 2, image 19. Subtle T2/FLAIR hyperintensity at the affected areas. No hemorrhagic transformation or mass effect. Nonspecific ventriculomegaly which might be cerebral volume loss related. Nomidline shift, mass effect, evidence of mass lesion, ventriculomegaly, extra-axial collection  or acute intracranial hemorrhage. Cervicomedullary junction and pituitary are within normal limits. Patchy and confluent bilateral cerebral white matter T2 and FLAIR hyperintensity. Chronic left thalamic lacunar infarct as stated above,  otherwise deep gray nuclei T2 heterogeneity appears mostly due to perivascular spaces. Chronic microhemorrhages also in both thalami on SWI, and in the right corona radiata. Vascular: Major intracranial vascular flow voids are preserved. Skull and upper cervical spine: Negative for age visible cervical spine. Visualized bone marrow signal is within normal limits. Sinuses/Orbits: Postoperative changes to both globes. Paranasal sinus mucoperiosteal thickening again noted. Other: Mastoids are well aerated. Grossly normal visible internal auditory structures. IMPRESSION: 1. Small acute infarcts scattered in the bilateral cerebellum (left greater than right). Punctate acute lacunar infarcts in both the left pons and thalamus. And minimal occipital lobe restricted diffusion, perhaps greater on the right. 2. No hemorrhagic transformation or intracranial mass effect. 3. Underlying chronic small vessel disease, including chronic thalamic microhemorrhages, and nonspecific ventricular enlargement. Electronically Signed   By: Odessa Fleming M.D.   On: 10/02/2022 05:46   CT CEREBRAL PERFUSION W CONTRAST  Result Date: 10/01/2022 CLINICAL DATA:  Diplopia, stroke suspected EXAM: CT ANGIOGRAPHY HEAD AND NECK CT PERFUSION BRAIN TECHNIQUE: Multidetector CT imaging of the head and neck was performed using the standard protocol during bolus administration of intravenous contrast. Multiplanar CT image reconstructions and MIPs were obtained to evaluate the vascular anatomy. Carotid stenosis measurements (when applicable) are obtained utilizing NASCET criteria, using the distal internal carotid diameter as the denominator. Multiphase CT imaging of the brain was performed following IV bolus contrast injection. Subsequent parametric perfusion maps were calculated using RAPID software. RADIATION DOSE REDUCTION: This exam was performed according to the departmental dose-optimization program which includes automated exposure control, adjustment of  the mA and/or kV according to patient size and/or use of iterative reconstruction technique. CONTRAST:  OMNIPAQUE IOHEXOL 350 MG/ML SOLN COMPARISON:  No prior CTA available, correlation is made with CT head 10/01/2022 FINDINGS: CT HEAD FINDINGS For noncontrast findings, please see same day CT head. CTA NECK FINDINGS Aortic arch: Four-vessel arch, with the left vertebral artery originating from the aorta. Imaged portion shows no evidence of aneurysm or dissection. No significant stenosis of the major arch vessel origins. Aortic atherosclerosis, with significant noncalcified and possibly ulcerated plaque. Right carotid system: No evidence of dissection, occlusion, or hemodynamically significant stenosis (greater than 50%). Retropharyngeal course of the proximal right ICA. Left carotid system: No evidence of dissection, occlusion, or hemodynamically significant stenosis (greater than 50%). Vertebral arteries: No evidence of dissection, occlusion, or hemodynamically significant stenosis (greater than 50%). Skeleton: No acute osseous abnormality. Degenerative changes in the cervical spine. Status post median sternotomy. Other neck: No acute finding. Upper chest: No focal pulmonary opacity or pleural effusion. Review of the MIP images confirms the above findings CTA HEAD FINDINGS Anterior circulation: Both internal carotid arteries are patent to the termini, with mild stenosis in the left cavernous segment, severe stenosis in the left supraclinoid segment proximally, and moderate stenosis in the right supraclinoid segment. A1 segments patent. Normal anterior communicating artery. Anterior cerebral arteries are patent to their distal aspects without significant stenosis. No M1 stenosis or occlusion. MCA branches perfused to their distal aspects without significant stenosis. Posterior circulation: Vertebral arteries patent to the vertebrobasilar junction without significant stenosis. Severe stenosis at the origin of  the right PICA (series 7, image 136). The left PICA is patent. Basilar patent to its distal aspect with mild stenosis in the distal basilar (series 7,  image 120). Superior cerebellar arteries patent proximally. Short segment occlusion of the proximal left P 1 segment (series 7, image 108). Additional moderate stenosis in the distal left P2 segment (series 7, image 112). Patent right P1 segment. The right PCA branches are patent to to their distal aspects with some irregularity but without significant stenosis. The right posterior communicating artery is patent. The left posterior communicating artery is not visualized. Venous sinuses: As permitted by contrast timing, patent. Anatomic variants: None significant. Review of the MIP images confirms the above findings CT Brain Perfusion Findings: ASPECTS: 10 CBF (<30%) Volume: 0mL Perfusion (Tmax>6.0s) volume: 19mL Mismatch Volume: 19mL Infarction Location:No infarct core identified. Possible area of decreased perfusion in the superior left cerebellum, along the posterior and lateral aspects, or left occipital lobe. IMPRESSION: 1. Short segment occlusion of the proximal left P1 segment, with additional moderate stenosis in the distal left P2 segment. 2. No infarct core identified. Possible area of decreased perfusion in the superior left cerebellum or left occipital lobe. This could reflect decreased perfusion related to short segment left P1 occlusion. Superior cerebellar decreased perfusion 3. Severe stenosis in the left supraclinoid ICA and moderate stenosis in the right supraclinoid ICA. 4. Severe stenosis at the origin of the right PICA. Mild stenosis in the distal basilar artery. 5. No hemodynamically significant stenosis in the neck. 6. Aortic atherosclerosis. Aortic Atherosclerosis (ICD10-I70.0). These results were called by telephone at the time of interpretation on 10/01/2022 at 10:59 pm to provider [facility] , who verbally acknowledged these results.  Electronically Signed   By: Wiliam Ke M.D.   On: 10/01/2022 22:59   CT ANGIO HEAD NECK W WO CM  Result Date: 10/01/2022 CLINICAL DATA:  Diplopia, stroke suspected EXAM: CT ANGIOGRAPHY HEAD AND NECK CT PERFUSION BRAIN TECHNIQUE: Multidetector CT imaging of the head and neck was performed using the standard protocol during bolus administration of intravenous contrast. Multiplanar CT image reconstructions and MIPs were obtained to evaluate the vascular anatomy. Carotid stenosis measurements (when applicable) are obtained utilizing NASCET criteria, using the distal internal carotid diameter as the denominator. Multiphase CT imaging of the brain was performed following IV bolus contrast injection. Subsequent parametric perfusion maps were calculated using RAPID software. RADIATION DOSE REDUCTION: This exam was performed according to the departmental dose-optimization program which includes automated exposure control, adjustment of the mA and/or kV according to patient size and/or use of iterative reconstruction technique. CONTRAST:  OMNIPAQUE IOHEXOL 350 MG/ML SOLN COMPARISON:  No prior CTA available, correlation is made with CT head 10/01/2022 FINDINGS: CT HEAD FINDINGS For noncontrast findings, please see same day CT head. CTA NECK FINDINGS Aortic arch: Four-vessel arch, with the left vertebral artery originating from the aorta. Imaged portion shows no evidence of aneurysm or dissection. No significant stenosis of the major arch vessel origins. Aortic atherosclerosis, with significant noncalcified and possibly ulcerated plaque. Right carotid system: No evidence of dissection, occlusion, or hemodynamically significant stenosis (greater than 50%). Retropharyngeal course of the proximal right ICA. Left carotid system: No evidence of dissection, occlusion, or hemodynamically significant stenosis (greater than 50%). Vertebral arteries: No evidence of dissection, occlusion, or hemodynamically significant  stenosis (greater than 50%). Skeleton: No acute osseous abnormality. Degenerative changes in the cervical spine. Status post median sternotomy. Other neck: No acute finding. Upper chest: No focal pulmonary opacity or pleural effusion. Review of the MIP images confirms the above findings CTA HEAD FINDINGS Anterior circulation: Both internal carotid arteries are patent to the termini, with mild stenosis in  the left cavernous segment, severe stenosis in the left supraclinoid segment proximally, and moderate stenosis in the right supraclinoid segment. A1 segments patent. Normal anterior communicating artery. Anterior cerebral arteries are patent to their distal aspects without significant stenosis. No M1 stenosis or occlusion. MCA branches perfused to their distal aspects without significant stenosis. Posterior circulation: Vertebral arteries patent to the vertebrobasilar junction without significant stenosis. Severe stenosis at the origin of the right PICA (series 7, image 136). The left PICA is patent. Basilar patent to its distal aspect with mild stenosis in the distal basilar (series 7, image 120). Superior cerebellar arteries patent proximally. Short segment occlusion of the proximal left P 1 segment (series 7, image 108). Additional moderate stenosis in the distal left P2 segment (series 7, image 112). Patent right P1 segment. The right PCA branches are patent to to their distal aspects with some irregularity but without significant stenosis. The right posterior communicating artery is patent. The left posterior communicating artery is not visualized. Venous sinuses: As permitted by contrast timing, patent. Anatomic variants: None significant. Review of the MIP images confirms the above findings CT Brain Perfusion Findings: ASPECTS: 10 CBF (<30%) Volume: 0mL Perfusion (Tmax>6.0s) volume: 19mL Mismatch Volume: 19mL Infarction Location:No infarct core identified. Possible area of decreased perfusion in the superior  left cerebellum, along the posterior and lateral aspects, or left occipital lobe. IMPRESSION: 1. Short segment occlusion of the proximal left P1 segment, with additional moderate stenosis in the distal left P2 segment. 2. No infarct core identified. Possible area of decreased perfusion in the superior left cerebellum or left occipital lobe. This could reflect decreased perfusion related to short segment left P1 occlusion. Superior cerebellar decreased perfusion 3. Severe stenosis in the left supraclinoid ICA and moderate stenosis in the right supraclinoid ICA. 4. Severe stenosis at the origin of the right PICA. Mild stenosis in the distal basilar artery. 5. No hemodynamically significant stenosis in the neck. 6. Aortic atherosclerosis. Aortic Atherosclerosis (ICD10-I70.0). These results were called by telephone at the time of interpretation on 10/01/2022 at 10:59 pm to provider [facility] , who verbally acknowledged these results. Electronically Signed   By: Wiliam Ke M.D.   On: 10/01/2022 22:59   CT HEAD CODE STROKE WO CONTRAST  Result Date: 10/01/2022 CLINICAL DATA:  Code stroke.  Double vision EXAM: CT HEAD WITHOUT CONTRAST TECHNIQUE: Contiguous axial images were obtained from the base of the skull through the vertex without intravenous contrast. RADIATION DOSE REDUCTION: This exam was performed according to the departmental dose-optimization program which includes automated exposure control, adjustment of the mA and/or kV according to patient size and/or use of iterative reconstruction technique. COMPARISON:  03/11/2017 FINDINGS: Brain: No evidence of acute infarction, hemorrhage, mass, mass effect, or midline shift. No hydrocephalus or extra-axial collection. Age related cerebral atrophy. Ex vacuo dilatation of the ventricles. Periventricular white matter changes, likely the sequela of chronic small vessel ischemic disease. Vascular: No hyperdense vessel. Skull: Negative for fracture or focal  lesion. Sinuses/Orbits: Chronic bilateral maxillary sinusitis. Mucosal thickening in the ethmoid air cells. Status post bilateral lens replacements. Dysconjugate gaze Other: The mastoid air cells are well aerated. ASPECTS [facility] Stroke Program Early CT Score) - Ganglionic level infarction (caudate, lentiform nuclei, internal capsule, insula, M1-M3 cortex): 7 - Supraganglionic infarction (M4-M6 cortex): 3 Total score (0-10 with 10 being normal): 10 IMPRESSION: 1. No acute intracranial process. 2. ASPECTS is 10. Code stroke imaging results were communicated on 10/01/2022 at 9:23 pm to provider [facility] via telephone, who verbally  acknowledged these results. Electronically Signed   By: Wiliam Ke M.D.   On: 10/01/2022 21:24    Labs:  CBC: Recent Labs    10/01/22 2057  WBC 7.5  HGB 14.9  HCT 43.8  PLT 180    COAGS: Recent Labs    10/01/22 2115  INR 1.0  APTT 27    BMP: Recent Labs    10/01/22 2057  NA 136  K 4.0  CL 99  CO2 28  GLUCOSE 169*  BUN 28*  CALCIUM 9.9  CREATININE 1.39*  GFRNONAA 49*    LIVER FUNCTION TESTS: Recent Labs    10/01/22 2057  BILITOT 0.5  AST 20  ALT 15  ALKPHOS 64  PROT 7.7  ALBUMIN 4.3    Assessment and Plan:  Cheyenne Janssen is an 88 yo male being seen today in relation to L PCA occlusion s/p mechanical thrombectomy with Dr Tommi Rumps Melchor Amour. Patient has persistent left hemianopsia, no other focal neurological deficits on exam. NIR team will continue to follow, please contact NIR team with any questions or concerns.   Electronically Signed: Kennieth Francois, PA-C 10/02/2022, 9:59 AM   I spent a total of 15 Minutes at the the patient's bedside AND on the patient's hospital floor or unit, greater than 50% of which was counseling/coordinating care for L PCA occlusion s/p mechanical thrombectomy.

## 2022-10-02 NOTE — Progress Notes (Signed)
  Echocardiogram 2D Echocardiogram has been performed.  Janalyn Harder 10/02/2022, 2:26 PM

## 2022-10-02 NOTE — Procedures (Signed)
INTERVENTIONAL NEURORADIOLOGY BRIEF POSTPROCEDURE NOTE  DIAGNOSTIC CEREBRAL ANGIOGRAM AND MECHANICAL THROMBECTOMY  Attending: Dr. Baldemar Lenis  Diagnosis: Left P1/PCA occlusion  Access site: Right radial artery  Access closure: TR band  Anesthesia: GETA  Medication used: refer to anesthesia documentation.  Complications: None  Estimated blood loss: 50 mL  Specimen: None  Findings: Proximal occlusion of the left P1/PCA with clot protruding into the distal basilar artery. Mechanical thrombectomy performed with direct contact aspiration with complete recanalization after one pass (TICI3). No thromboembolic or hemorrhagic complication.  The patient tolerated the procedure well. He was extubated and was transferred to ICU in stable condition.   PLAN: - SBP 120-160 mmHg - NO bed rest required (no femoral access). - Radial access care per orders.

## 2022-10-02 NOTE — Progress Notes (Signed)
I was left message by patient's daughter regarding patient being in the hospital.  I reviewed his chart, patient has come in with thrombotic stroke, however patient is not atypical atrial flutter.  He has had paroxysmal episodes of atrial fibrillation and atrial flutter on loop recorder monitoring but no sustained episodes however given his presentation of stroke, I will start him on IV heparin for tonight until I make discussions with neurology to see whether he should be fully anticoagulated and also will check his loop recorder to see whether he has had persistent episodes in the last 24 hours.   Yates Decamp, MD, [facility] 10/02/2022, 7:45 PM Office: [phone] Fax: [phone] Pager: [phone]

## 2022-10-02 NOTE — Progress Notes (Signed)
Inpatient Rehab Admissions Coordinator Note:   Per PT patient was screened for CIR candidacy by Damani Kelemen Luvenia Starch, CCC-SLP. At this time, pt appears to be a potential candidate for CIR. I will place an order for rehab consult for full assessment, per our protocol.  Please contact me any with questions.Wolfgang Phoenix, MS, CCC-SLP Admissions Coordinator [phone] 10/02/22 4:43 PM

## 2022-10-02 NOTE — Evaluation (Signed)
Physical Therapy Evaluation Patient Details Name: [name] MRN: [id number] DOB: 1935-01-01 Today's Date: 10/02/2022  History of Present Illness  88 y.o. male admitted on 10/01/22 for double vision and hemianopia.  LKW time was unknown, so no TNK was given, however, he did undergo IR thrombectomy of L P1 PCA occlusion shortly after admission. CTA revealed  P1 occlusion with stenosis of P2. MRI revealed small cute infracrts bil cerebellum (L>R) punctate acute lacunar infarcts in both left pons and thalamus, minimal occipital lobe restricted diffusion (R>L).  Clinical Impression  Limited bed level evaluation today as pt started to vomit during bed level PT assessment.  He is normally very independent, lives alone, drives, is active in OP PT for low back pain.  His daughter is in the room and engaged in his treatment and care.  She reports there are 4 children all who live here locally.  I am hopeful we will be able to mobilize OOB tomorrow, however, currently he needs a little more time before he can tolerate increased mobility.  RN in room performing a neuro assessment post vomiting.   PT to follow acutely for deficits listed below.        Recommendations for follow up therapy are one component of a multi-disciplinary discharge planning process, led by the attending physician.  Recommendations may be updated based on patient status, additional functional criteria and insurance authorization.  Follow Up Recommendations       Assistance Recommended at Discharge Frequent or constant Supervision/Assistance  Patient can return home with the following  A lot of help with walking and/or transfers;A lot of help with bathing/dressing/bathroom;Direct supervision/assist for medications management;Direct supervision/assist for financial management;Assist for transportation;Help with stairs or ramp for entrance;Assistance with cooking/housework    Equipment Recommendations Rolling walker (2 wheels)   Recommendations for Other Services  Rehab consult    Functional Status Assessment Patient has had a recent decline in their functional status and demonstrates the ability to make significant improvements in function in a reasonable and predictable amount of time.     Precautions / Restrictions Precautions Precautions: Fall Precaution Comments: per daughter, pt is not the most steady on his feet at baseline      Mobility  Bed Mobility Overal bed mobility: Needs Assistance Bed Mobility: Supine to Sit     Supine to sit: Mod assist, HOB elevated     General bed mobility comments: Mod assist to lift shoulders off of bed to vomit into vomit bag which he missed due to difficulty with visual perception.    Transfers                   General transfer comment: NT, called RN into room after pt started vomiting.    Ambulation/Gait                  Stairs            Wheelchair Mobility    Modified Rankin (Stroke Patients Only) Modified Rankin (Stroke Patients Only) Pre-Morbid Rankin Score: No symptoms Modified Rankin: Severe disability     Balance                                             Pertinent Vitals/Pain Pain Assessment Pain Assessment: Faces Faces Pain Scale: Hurts even more Pain Location: headache Pain Descriptors / Indicators: Aching Pain Intervention(s): Limited activity  within patient's tolerance, Monitored during session    Home Living Family/patient expects to be discharged to:: Private residence Living Arrangements: Alone Available Help at Discharge: Family;Available PRN/intermittently (daughter in room cannot provide 24/7 assist at discharge) Type of Home: House Home Access: Stairs to enter Entrance Stairs-Rails: None Entrance Stairs-Number of Steps: 2   Home Layout: Two level;Full bath on main level;Able to live on main level with bedroom/bathroom Home Equipment: None      Prior Function Prior Level of  Function : Driving;Independent/Modified Independent             Mobility Comments: does not use or own a cane or a walker, mildly unsteady at baseline.  Active with OP PT for his back.       Hand Dominance   Dominant Hand: Right    Extremity/Trunk Assessment   Upper Extremity Assessment Upper Extremity Assessment: Defer to OT evaluation    Lower Extremity Assessment Lower Extremity Assessment: RLE deficits/detail;LLE deficits/detail;Generalized weakness RLE Deficits / Details: right leg appeared mildly weaker than left leg per bed level gross assessment.  Sensation equal and intact bil to LT LLE Deficits / Details: stronger than R leg per bed level gross assessment.       Communication      Cognition Arousal/Alertness: Awake/alert Behavior During Therapy: Impulsive, Agitated (mildly agitated and irritable.) Overall Cognitive Status: Impaired/Different from baseline Area of Impairment: Awareness, Safety/judgement                           Awareness: Intellectual   General Comments: decreased awareness of deficits and safety per conversations with SLP        General Comments      Exercises     Assessment/Plan    PT Assessment Patient needs continued PT services  PT Problem List Decreased strength;Decreased mobility;Decreased balance;Decreased activity tolerance;Decreased cognition;Decreased coordination;Decreased knowledge of use of DME;Decreased safety awareness;Decreased knowledge of precautions;Pain       PT Treatment Interventions DME instruction;Gait training;Stair training;Functional mobility training;Therapeutic activities;Therapeutic exercise;Balance training;Neuromuscular re-education;Cognitive remediation;Patient/family education    PT Goals (Current goals can be found in the Care Plan section)  Acute Rehab PT Goals Patient Stated Goal: go home, daughter is interested in looking into CIR level therapies if he qualifies PT Goal  Formulation: With patient/family Time For Goal Achievement: 10/16/22 Potential to Achieve Goals: Good    Frequency Min 4X/week     Co-evaluation               AM-PAC PT "6 Clicks" Mobility  Outcome Measure Help needed turning from your back to your side while in a flat bed without using bedrails?: A Lot Help needed moving from lying on your back to sitting on the side of a flat bed without using bedrails?: Total Help needed moving to and from a bed to a chair (including a wheelchair)?: Total Help needed standing up from a chair using your arms (e.g., wheelchair or bedside chair)?: Total Help needed to walk in hospital room?: Total Help needed climbing 3-5 steps with a railing? : Total 6 Click Score: 7    End of Session Equipment Utilized During Treatment: Oxygen Activity Tolerance: Other (comment) (limited by N/V) Patient left: in bed;with call bell/phone within reach;with nursing/sitter in room;with family/visitor present   PT Visit Diagnosis: Muscle weakness (generalized) (M62.81);Difficulty in walking, not elsewhere classified (R26.2);Pain Pain - Right/Left:  (head) Pain - part of body:  (head)    Time: 0865-7846 PT Time  Calculation (min) (ACUTE ONLY): 19 min   Charges:   PT Evaluation $PT Eval Moderate Complexity: 1 Mod        Corinna Capra, PT, DPT  Acute Rehabilitation Secure chat is best for contact #[phone] office

## 2022-10-02 NOTE — Progress Notes (Signed)
ANTICOAGULATION CONSULT NOTE - Initial Consult  Pharmacy Consult for Heparin Indication: atrial fibrillation, also acute stroke  Allergies  Allergen Reactions   Ketorolac Tromethamine Other (See Comments)    nephrotic syndrome    Patient Measurements: Weight: 79.4 kg (175 lb) Heparin Dosing Weight: 79.4 kg  Vital Signs: Temp: 97.9 F (36.6 C) (06/16 1551) Temp Source: Oral (06/16 1551) BP: 136/74 (06/16 2000) Pulse Rate: 65 (06/16 2000)  Labs: Recent Labs    10/01/22 2057 10/01/22 2115  HGB 14.9  --   HCT 43.8  --   PLT 180  --   APTT  --  27  LABPROT  --  13.6  INR  --  1.0  CREATININE 1.39*  --     Estimated Creatinine Clearance: 38.1 mL/min (A) (by C-G formula based on SCr of 1.39 mg/dL (H)).   Medical History: Past Medical History:  Diagnosis Date   Acute renal failure (HCC)    secondary to nephrotic syndrome post cath, resolved with steroids   Anxiety    CAD (coronary artery disease) 04/2006   LIMA to LAD, sequential SVG to first, second, and third OM's, sequential SVG to mid RCA and PDA    Depression    Diabetes mellitus without complication (HCC)    ED (erectile dysfunction)    HTN (hypertension)    Hx of adenomatous colonic polyps    Hyperlipidemia    Loop recorder: Biotronic Biomonitor II loop 07/28/2021 07/28/2021   Obesity     Medications:  Medications Prior to Admission  Medication Sig Dispense Refill Last Dose   acebutolol (SECTRAL) 200 MG capsule Take 1 capsule by mouth twice daily 180 capsule 1 10/01/2022 at 0900   aspirin EC 81 MG tablet Take 81 mg by mouth daily.   10/01/2022   atorvastatin (LIPITOR) 40 MG tablet Take 40 mg by mouth every evening.   10/01/2022   furosemide (LASIX) 20 MG tablet Take 1 tablet by mouth daily as needed. Take in morning   has not needed   JARDIANCE 25 MG TABS tablet Take 1 tablet by mouth every evening.  10 Past Week   metFORMIN (GLUCOPHAGE) 1000 MG tablet 1 pill twice a day   10/01/2022   MYRBETRIQ 50 MG TB24  tablet    10/01/2022   olmesartan-hydrochlorothiazide (BENICAR HCT) 40-25 MG tablet TAKE 1 TABLET BY MOUTH ONCE DAILY IN THE MORNING 90 tablet 1 10/01/2022   tadalafil (CIALIS) 5 MG tablet Take 5 mg by mouth daily.   10/01/2022   tamsulosin (FLOMAX) 0.4 MG CAPS capsule Take 1 capsule by mouth daily after supper.   10/01/2022    Assessment: 88 y.o. M presents with acute stroke - likely cardioembolic. Pt remains in afib. Consulted by Dr. Jacinto Halim for low dose heparin for afib in stroke pt. CBC ok on admission.  Goal of Therapy:  Heparin level 0.3-0.5 units/ml Monitor platelets by anticoagulation protocol: Yes   Plan:  Heparin gtt at 800 units/hr. No bolus. Will f/u heparin level in 8 hours Daily heparin level and CBC  Christoper Fabian, PharmD, BCPS Please see amion for complete clinical pharmacist phone list 10/02/2022,8:17 PM

## 2022-10-03 ENCOUNTER — Encounter (HOSPITAL_COMMUNITY): Payer: Self-pay | Admitting: Radiology

## 2022-10-03 DIAGNOSIS — H5347 Heteronymous bilateral field defects: Secondary | ICD-10-CM | POA: Diagnosis not present

## 2022-10-03 LAB — GLUCOSE, CAPILLARY
Glucose-Capillary: 174 mg/dL — ABNORMAL HIGH (ref 70–99)
Glucose-Capillary: 183 mg/dL — ABNORMAL HIGH (ref 70–99)
Glucose-Capillary: 243 mg/dL — ABNORMAL HIGH (ref 70–99)
Glucose-Capillary: 140 mg/dL — ABNORMAL HIGH (ref 70–99)
Glucose-Capillary: 139 mg/dL — ABNORMAL HIGH (ref 70–99)

## 2022-10-03 LAB — CBC
HCT: 40.6 % (ref 39.0–52.0)
Hemoglobin: 13.7 g/dL (ref 13.0–17.0)
MCH: 32 pg (ref 26.0–34.0)
MCHC: 33.7 g/dL (ref 30.0–36.0)
MCV: 94.9 fL (ref 80.0–100.0)
Platelets: 161 10*3/uL (ref 150–400)
RBC: 4.28 MIL/uL (ref 4.22–5.81)
RDW: 13.1 % (ref 11.5–15.5)
WBC: 10.1 10*3/uL (ref 4.0–10.5)
nRBC: 0 % (ref 0.0–0.2)

## 2022-10-03 LAB — HEPARIN LEVEL (UNFRACTIONATED)
Heparin Unfractionated: <0.1 IU/mL — ABNORMAL LOW (ref 0.30–0.70)
Heparin Unfractionated: 0.39 IU/mL (ref 0.30–0.70)

## 2022-10-03 MED ORDER — INSULIN ASPART 100 UNIT/ML IJ SOLN
0.0000 [IU] | Freq: Three times a day (TID) | INTRAMUSCULAR | Status: DC
  Administered 2022-10-03: 2 [IU] via SUBCUTANEOUS
  Administered 2022-10-03: 5 [IU] via SUBCUTANEOUS
  Administered 2022-10-04 (×2): 3 [IU] via SUBCUTANEOUS
  Administered 2022-10-04 – 2022-10-05 (×2): 5 [IU] via SUBCUTANEOUS
  Administered 2022-10-05: 3 [IU] via SUBCUTANEOUS
  Administered 2022-10-05 – 2022-10-06 (×2): 2 [IU] via SUBCUTANEOUS
  Administered 2022-10-06: 3 [IU] via SUBCUTANEOUS

## 2022-10-03 MED ORDER — SENNOSIDES-DOCUSATE SODIUM 8.6-50 MG PO TABS
2.0000 | ORAL_TABLET | Freq: Every day | ORAL | Status: DC
  Administered 2022-10-03 – 2022-10-05 (×3): 2 via ORAL
  Filled 2022-10-03 (×3): qty 2

## 2022-10-03 MED ORDER — HYDRALAZINE HCL 20 MG/ML IJ SOLN: 5.0000 mg | Freq: Four times a day (QID) | INTRAMUSCULAR | Status: DC | PRN

## 2022-10-03 MED ORDER — SENNOSIDES-DOCUSATE SODIUM 8.6-50 MG PO TABS: 2.0000 | ORAL_TABLET | Freq: Every evening | ORAL | Status: DC | PRN

## 2022-10-03 NOTE — Progress Notes (Addendum)
STROKE TEAM PROGRESS NOTE   INTERVAL HISTORY Patient sitting in chair, son at bedside.  RN at bedside.  A flutter on monitor.   Discussed anticoagulation with cardiology, will see most likely recommend changing heparin to  Xarelto, as patient stated that he had taken Eliquis in the past and it made him sick (no record of this medication in chart).   Patient oriented day and month, left droop, decreased left visual field, trace weakness in left arm.  OK to transfer out of ICU.    Vitals:   10/03/22 0900 10/03/22 0930 10/03/22 0945 10/03/22 0953  BP: (!) 156/84 136/75 (!) 156/84 (!) 157/77  Pulse: 75 75 76 77  Resp: 17 18 19 17   Temp:      TempSrc:      SpO2: 94% 90% 95% 95%  Weight:       CBC:  Recent Labs  Lab 10/01/22 2057 10/03/22 0815  WBC 7.5 10.1  NEUTROABS 4.1  --   HGB 14.9 13.7  HCT 43.8 40.6  MCV 95.2 94.9  PLT 180 161    Basic Metabolic Panel:  Recent Labs  Lab 10/01/22 2057  NA 136  K 4.0  CL 99  CO2 28  GLUCOSE 169*  BUN 28*  CREATININE 1.39*  CALCIUM 9.9    Lipid Panel:  Recent Labs  Lab 10/02/22 0227  CHOL 136  TRIG 68  HDL 45  CHOLHDL 3.0  VLDL 14  LDLCALC 77    HgbA1c:  Recent Labs  Lab 10/02/22 0227  HGBA1C 8.4*    Urine Drug Screen:  Recent Labs  Lab 10/01/22 2225  LABOPIA NONE DETECTED  COCAINSCRNUR NONE DETECTED  LABBENZ NONE DETECTED  AMPHETMU NONE DETECTED  THCU NONE DETECTED  LABBARB NONE DETECTED     Alcohol Level  Recent Labs  Lab 10/01/22 2057  ETH <10     IMAGING past 24 hours ECHOCARDIOGRAM COMPLETE  Result Date: 10/02/2022    ECHOCARDIOGRAM REPORT   Patient Name:   [name] Date of Exam: 10/02/2022 Medical Rec #:  [id number]        Height:       69.0 in Accession #:    [id number]       Weight:       175.0 lb Date of Birth:  05/27/1934        BSA:          1.952 m Patient Age:    88 years         BP:           135/71 mmHg Patient Gender: M                HR:           74 bpm. Exam Location:   Inpatient Procedure: 2D Echo, Cardiac Doppler, Color Doppler and Intracardiac            Opacification Agent Indications:    Stroke  History:        Patient has prior history of Echocardiogram examinations, most                 recent 11/18/2019.  Sonographer:    Sheralyn Boatman RDCS Referring Phys: [phone] [facility]  Sonographer Comments: Technically difficult study due to poor echo windows. Image acquisition challenging due to patient body habitus. Study delayed for patient care. IMPRESSIONS  1. Left ventricular ejection fraction, by estimation, is 60 to 65%. The left ventricle has normal function.  The left ventricle has no regional wall motion abnormalities. Left ventricular diastolic parameters are indeterminate.  2. Right ventricular systolic function is mildly reduced. The right ventricular size is normal. Tricuspid regurgitation signal is inadequate for assessing PA pressure. The estimated right ventricular systolic pressure is 37.8 mmHg.  3. Left atrial size was mild to moderately dilated.  4. The mitral valve is degenerative. Mild mitral valve regurgitation. No evidence of mitral stenosis.  5. The aortic valve is grossly normal. There is mild calcification of the aortic valve. There is mild thickening of the aortic valve. Aortic valve regurgitation is not visualized. No aortic stenosis is present.  6. The inferior vena cava is dilated in size with <50% respiratory variability, suggesting right atrial pressure of 15 mmHg. FINDINGS  Left Ventricle: Left ventricular ejection fraction, by estimation, is 60 to 65%. The left ventricle has normal function. The left ventricle has no regional wall motion abnormalities. Definity contrast agent was given IV to delineate the left ventricular  endocardial borders. The left ventricular internal cavity size was normal in size. There is no left ventricular hypertrophy. Left ventricular diastolic parameters are indeterminate. Right Ventricle: The right ventricular size is  normal. No increase in right ventricular wall thickness. Right ventricular systolic function is mildly reduced. Tricuspid regurgitation signal is inadequate for assessing PA pressure. The tricuspid regurgitant velocity is 2.39 m/s, and with an assumed right atrial pressure of 15 mmHg, the estimated right ventricular systolic pressure is 37.8 mmHg. Left Atrium: Left atrial size was mild to moderately dilated. Right Atrium: Right atrial size was normal in size. Pericardium: There is no evidence of pericardial effusion. Presence of epicardial fat layer. Mitral Valve: The mitral valve is degenerative in appearance. There is mild calcification of the mitral valve leaflet(s). Mild mitral annular calcification. Mild mitral valve regurgitation. No evidence of mitral valve stenosis. Tricuspid Valve: The tricuspid valve is normal in structure. Tricuspid valve regurgitation is trivial. No evidence of tricuspid stenosis. Aortic Valve: The aortic valve is grossly normal. There is mild calcification of the aortic valve. There is mild thickening of the aortic valve. Aortic valve regurgitation is not visualized. No aortic stenosis is present. Pulmonic Valve: The pulmonic valve was normal in structure. Pulmonic valve regurgitation is not visualized. No evidence of pulmonic stenosis. Aorta: The aortic root is normal in size and structure. Venous: The inferior vena cava is dilated in size with less than 50% respiratory variability, suggesting right atrial pressure of 15 mmHg. IAS/Shunts: No atrial level shunt detected by color flow Doppler.  LEFT VENTRICLE PLAX 2D LVIDd:         4.00 cm   Diastology LVIDs:         2.40 cm   LV e' medial:    7.62 cm/s LV PW:         1.10 cm   LV E/e' medial:  16.0 LV IVS:        1.10 cm   LV e' lateral:   8.38 cm/s LVOT diam:     2.40 cm   LV E/e' lateral: 14.6 LV SV:         74 LV SV Index:   38 LVOT Area:     4.52 cm  RIGHT VENTRICLE            IVC RV S prime:     4.57 cm/s  IVC diam: 2.10 cm TAPSE  (M-mode): 0.5 cm LEFT ATRIUM             Index  RIGHT ATRIUM           Index LA diam:        4.30 cm 2.20 cm/m   RA Area:     10.20 cm LA Vol (A2C):   38.7 ml 19.83 ml/m  RA Volume:   21.30 ml  10.91 ml/m LA Vol (A4C):   44.7 ml 22.90 ml/m LA Biplane Vol: 45.2 ml 23.16 ml/m  AORTIC VALVE LVOT Vmax:   81.70 cm/s LVOT Vmean:  53.100 cm/s LVOT VTI:    0.164 m  AORTA Ao Root diam: 3.00 cm Ao Asc diam:  3.50 cm MITRAL VALVE                TRICUSPID VALVE MV Area (PHT): 4.15 cm     TR Peak grad:   22.8 mmHg MV Decel Time: 183 msec     TR Vmax:        239.00 cm/s MV E velocity: 122.00 cm/s                             SHUNTS                             Systemic VTI:  0.16 m                             Systemic Diam: 2.40 cm Weston Brass MD Electronically signed by Weston Brass MD Signature Date/Time: 10/02/2022/3:39:50 PM    Final     PHYSICAL EXAM  Temp:  [97.7 F (36.5 C)-99.1 F (37.3 C)] 97.7 F (36.5 C) (06/17 0800) Pulse Rate:  [65-89] 77 (06/17 0953) Resp:  [14-23] 17 (06/17 0953) BP: (112-159)/(58-92) 157/77 (06/17 0953) SpO2:  [90 %-100 %] 95 % (06/17 0953)  General - Well nourished, well developed, in no apparent distress. Cardiovascular - Regular rhythm and rate.  Mental Status -  Level of arousal and orientation to self, place, month, day of week. Not oriented to year (1974).  Language including expression, naming, repetition, comprehension was assessed and found intact. Attention span and concentration were normal. Recent and remote memory were decreased, does not remember coming into hospital.  Fund of Knowledge was assessed and was intact.  Cranial Nerves II - XII - II - Visual field left hemianopia III, IV, VI - Extraocular movements intact. V - L facial droop VII - Facial movement intact bilaterally. VIII - Hearing & vestibular intact bilaterally. X - Palate elevates symmetrically. XI - Chin turning & shoulder shrug intact bilaterally. XII - Tongue protrusion  intact.  Motor Strength - Trace LUE weakness, decreased grip.Bulk was normal and fasciculations were absent.   Motor Tone - Muscle tone was assessed at the neck and appendages and was normal  Sensory - Light touch assessed and symmetrical.    Coordination - No ataxia, Decreased fine motor movements LUE.   Tremor was absent.  Gait and Station - deferred.  ASSESSMENT/PLAN [name] is a 88 y.o. male with history of CAD, hypertension, hyperlipidemia, s/p loop recorder, diabetes, anxiety and depression presenting with diplopia.   Stroke: Multiple scattered bilateral ischemic infarcts s/p thrombectomy of left P1/PCA TICI 3 Etiology: Likely cardioembolic Code Stroke  CT head No acute abnormality. ASPECTS 10.    CTA head & neck Short segment occlusion of the proximal left P1 segment, with additional moderate stenosis in  the distal left P2 segment. Severe stenosis at the origin of the right PICA. Mild stenosis in the distal basilar artery. CT perfusion no core infarct Cerebral angio Proximal occlusion of the left P1/PCA with clot protruding into the distal basilar artery. Mechanical thrombectomy performed with direct contact aspiration with complete recanalization after one pass (TICI3)  Post IR CT no hemorrhage MRI  Small acute infarcts scattered in the bilateral cerebellum (left greater than right). Punctate acute lacunar infarcts in both the left pons and thalamus. And minimal occipital lobe restricted diffusion, perhaps greater on the right. 2D Echo pending LDL 77 HgbA1c 8.4 VTE prophylaxis -SCDs    Diet   Diet Carb Modified Fluid consistency: Thin; Room service appropriate? Yes   Aspirin 81 mg prior to admission, now on on Aspirin, Plavix and Heparin gtt. Likely to transfer Heparin to Xarelto, pending cardiology assessment/recommendations.  Therapy recommendations: Pending Disposition: Pending  Hypertensive emergency Home meds: Acebutolol,  olmesartan/hydrochlorothiazide UNStable Cleviprex off SBP goal 120-160 S/P thrombectomy for 24 hours, gradually normalize Long-term BP goal normotensive  Atrial Flutter Atrial Fibrillation Seen on loop recorder, paroxysmal episodes Pending check of previous 2 days Currently on Aspirin, Plavix and Heparin gtt, pending Cards recs and transition to PO med.   Hyperlipidemia Home meds: Atorvastatin 40, resumed in hospital LDL 77, goal < 70 Zetia 10mg  added Continue statin at discharge  Diabetes type II UnControlled Home meds: Metformin, Jardiance HgbA1c 8.4, goal < 7.0 CBGs Recent Labs    10/02/22 2204 10/03/22 0735 10/03/22 1133  GLUCAP 172* 174* 183*     SSI Will need close PCP follow-up for diabetes management  Other Stroke Risk Factors Advanced Age >/= 28  Coronary artery disease  Other Active Problems ACute kidney injury-CR 1.39-continue IV fluids BPH restart home meds  Hospital day # 1   Pt seen by Neuro NP/APP and later by MD. Note/plan to be edited by MD as needed.    Lynnae January, DNP, AGACNP-BC [facility] Please use AMION for contact information & EPIC for messaging.  STROKE MD NOTE :  I have personally obtained history,examined this patient, reviewed notes, independently viewed imaging studies, participated in medical decision making and plan of care.ROS completed by me personally and pertinent positives fully documented  I have made any additions or clarifications directly to the above note. Agree with note above.  Patient presented with left P1 occlusion and underwent successful mechanical thrombectomy and MRI scan shows small bilateral cerebellar and small occipital infarcts neurological exam shows left hemianopsia.  Patient is currently in A-fib a flutter and will need long-term anticoagulation.  Long discussion with patient and son at the bedside and with Dr. Jacinto Halim his cardiologist.  Continue IV heparin for now but will need to switch to  Xarelto as patient is reluctant to take Eliquis.  Consult cardiology for management of a flutter.  Transfer out of ICU to neurology floor bed.  Therapy consults.This patient is critically ill and at significant risk of neurological worsening, death and care requires constant monitoring of vital signs, hemodynamics,respiratory and cardiac monitoring, extensive review of multiple databases, frequent neurological assessment, discussion with family, other specialists and medical decision making of high complexity.I have made any additions or clarifications directly to the above note.This critical care time does not reflect procedure time, or teaching time or supervisory time of PA/NP/Med Resident etc but could involve care discussion time.  I spent 30 minutes of neurocritical care time  in the care of  this patient.  Delia Heady, MD Medical Director [facility] Pager: [phone] 10/03/2022 3:22 PM   To contact Stroke Continuity provider, please refer to [email or web address]. After hours, contact General Neurology

## 2022-10-03 NOTE — Evaluation (Signed)
Occupational Therapy Evaluation Patient Details Name: [name] MRN: [id number] DOB: 04/12/1935 Today's Date: 10/03/2022   History of Present Illness 88 y.o. male admitted on 10/01/22 for double vision and hemianopsia.  LKW time was unknown, so no TNK was given, however, he did undergo IR thrombectomy of L P1 PCA occlusion shortly after admission. CTA revealed  P1 occlusion with stenosis of P2. MRI revealed small cute infracrts bil cerebellum (L>R) punctate acute lacunar infarcts in both left pons and thalamus, minimal occipital lobe restricted diffusion (R>L). PMH includes: ARF, anxiety, DM, HTN.   Clinical Impression   PTA patient independent and driving, living alone. Admitted for above and presents with problem list below, including significant L visual deficits, impaired balance, decreased cognition (recall, problem solving, awareness of deficits, and safety).  He requires mod assist for bed mobility, min assist +2 for transfers, but up to max assist for ADLs.  He requires max assist for visual scanning towards L side with multimodal cueing to sustain visual attention to L side.  Based on performance today, believe pt will best benefit from continued OT services acutely and after dc at an inpatient setting with >3hrs/day to optimize independence, safety and return to PLOF with ADLs and mobility.     Recommendations for follow up therapy are one component of a multi-disciplinary discharge planning process, led by the attending physician.  Recommendations may be updated based on patient status, additional functional criteria and insurance authorization.   Assistance Recommended at Discharge Frequent or constant Supervision/Assistance  Patient can return home with the following Two people to help with walking and/or transfers;Assistance with cooking/housework;Direct supervision/assist for medications management;Direct supervision/assist for financial management;Assist for transportation;Help  with stairs or ramp for entrance;Assistance with feeding;A lot of help with bathing/dressing/bathroom    Functional Status Assessment  Patient has had a recent decline in their functional status and demonstrates the ability to make significant improvements in function in a reasonable and predictable amount of time.  Equipment Recommendations  Other (comment) (defer)    Recommendations for Other Services       Precautions / Restrictions Precautions Precautions: Fall Precaution Comments: L visual deficits Restrictions Weight Bearing Restrictions: No      Mobility Bed Mobility Overal bed mobility: Needs Assistance Bed Mobility: Supine to Sit     Supine to sit: Mod assist, HOB elevated     General bed mobility comments: trunk support and scooting foward    Transfers Overall transfer level: Needs assistance Equipment used: 2 person hand held assist Transfers: Sit to/from Stand Sit to Stand: Min assist, +2 physical assistance, +2 safety/equipment           General transfer comment: steadying support, min assist      Balance Overall balance assessment: Needs assistance Sitting-balance support: No upper extremity supported, Feet supported Sitting balance-Leahy Scale: Fair Sitting balance - Comments: limited dynamically   Standing balance support: Bilateral upper extremity supported, During functional activity Standing balance-Leahy Scale: Poor Standing balance comment: relies on BUE and external support                           ADL either performed or assessed with clinical judgement   ADL Overall ADL's : Needs assistance/impaired     Grooming: Maximal assistance;Sitting           Upper Body Dressing : Maximal assistance;Sitting   Lower Body Dressing: +2 for physical assistance;Maximal assistance;+2 for safety/equipment;Sit to/from stand   Toilet Transfer: Minimal  assistance;+2 for physical assistance;+2 for safety/equipment            Functional mobility during ADLs: Minimal assistance;+2 for physical assistance;+2 for safety/equipment General ADL Comments: pt limited by L visual deficits and impaired balance, decreased cognitoin     Vision Baseline Vision/History: 1 Wears glasses (reading glasses) Ability to See in Adequate Light: 0 Adequate Patient Visual Report: No change from baseline Vision Assessment?: Yes Eye Alignment: Within Functional Limits Ocular Range of Motion: Restricted on the left (initally, able to scan to L side max cueing) Alignment/Gaze Preference: Gaze right;Head turned (to R preference) Tracking/Visual Pursuits: Impaired - to be further tested in functional context Additional Comments: pt denies diplopia, L visual deficit about 10 degrees past midline to R.  He has very poor awareness of this, therefore very difficulty to assess scanning abilities.  He can scan to L side with max cueing but includes significant head turn.     Perception Perception Perception Tested?: Yes Perception Deficits: Inattention/neglect Inattention/Neglect: Does not attend to left visual field;Does not attend to left side of body Comments: visually impacting but decreased awareness to L side as well   Praxis      Pertinent Vitals/Pain Pain Assessment Pain Assessment: Faces Faces Pain Scale: Hurts a little bit Pain Location: headache Pain Descriptors / Indicators: Aching, Headache Pain Intervention(s): Limited activity within patient's tolerance, Monitored during session, Repositioned     Hand Dominance Right   Extremity/Trunk Assessment Upper Extremity Assessment Upper Extremity Assessment: LUE deficits/detail LUE Deficits / Details: decreased sesnation and functional use, poor awareness of side but overall strength WFL poor coordination LUE Sensation: decreased light touch;decreased proprioception LUE Coordination: decreased fine motor;decreased gross motor   Lower Extremity Assessment Lower Extremity  Assessment: Defer to PT evaluation       Communication Communication Communication: No difficulties   Cognition Arousal/Alertness: Awake/alert Behavior During Therapy: Flat affect Overall Cognitive Status: Impaired/Different from baseline Area of Impairment: Awareness, Safety/judgement, Orientation, Attention, Memory, Following commands, Problem solving                 Orientation Level: Disoriented to, Time Current Attention Level: Focused Memory: Decreased recall of precautions, Decreased short-term memory Following Commands: Follows one step commands consistently, Follows one step commands with increased time, Follows multi-step commands inconsistently Safety/Judgement: Decreased awareness of safety, Decreased awareness of deficits Awareness: Intellectual Problem Solving: Slow processing, Decreased initiation, Difficulty sequencing, Requires verbal cues, Requires tactile cues General Comments: patient disoriented to time, reports year is 75.  Able to correct age with cue from son.  Patient following simple commands with increased time, signifincantly decreased awareness of deficits and safety with poor problem solving.  Poor carryover of scnning techniques.     General Comments  VSS, son present and supportive    Exercises     Shoulder Instructions      Home Living Family/patient expects to be discharged to:: Private residence Living Arrangements: Alone Available Help at Discharge: Family;Available PRN/intermittently Type of Home: House Home Access: Stairs to enter [employer] of Steps: 2 Entrance Stairs-Rails: None Home Layout: Two level;Full bath on main level;Able to live on main level with bedroom/bathroom     Bathroom Shower/Tub: Producer, television/film/video: Standard     Home Equipment: None          Prior Functioning/Environment Prior Level of Function : Driving;Independent/Modified Independent             Mobility Comments:  does not use or own a cane or  a walker, mildly unsteady at baseline.  Active with OP PT for his back. ADLs Comments: independent and driving        OT Problem List: Decreased strength;Decreased activity tolerance;Impaired balance (sitting and/or standing);Impaired UE functional use;Impaired vision/perception;Decreased coordination;Decreased cognition;Decreased safety awareness;Decreased knowledge of use of DME or AE;Decreased knowledge of precautions;Impaired sensation      OT Treatment/Interventions: Self-care/ADL training;Neuromuscular education;DME and/or AE instruction;Therapeutic activities;Cognitive remediation/compensation;Patient/family education;Balance training;Visual/perceptual remediation/compensation;Energy conservation;Therapeutic exercise    OT Goals(Current goals can be found in the care plan section) Acute Rehab OT Goals Patient Stated Goal: get better and then home OT Goal Formulation: With patient Time For Goal Achievement: 10/17/22 Potential to Achieve Goals: Fair  OT Frequency: Min 2X/week    Co-evaluation PT/OT/SLP Co-Evaluation/Treatment: Yes Reason for Co-Treatment: For patient/therapist safety;Necessary to address cognition/behavior during functional activity;To address functional/ADL transfers   OT goals addressed during session: ADL's and self-care      AM-PAC OT "6 Clicks" Daily Activity     Outcome Measure Help from another person eating meals?: A Lot Help from another person taking care of personal grooming?: A Lot Help from another person toileting, which includes using toliet, bedpan, or urinal?: A Lot Help from another person bathing (including washing, rinsing, drying)?: A Lot Help from another person to put on and taking off regular upper body clothing?: A Lot Help from another person to put on and taking off regular lower body clothing?: A Lot 6 Click Score: 12   End of Session Equipment Utilized During Treatment: Gait belt Nurse Communication:  Mobility status  Activity Tolerance: Patient tolerated treatment well Patient left: in chair;with call bell/phone within reach;with chair alarm set;with family/visitor present  OT Visit Diagnosis: Unsteadiness on feet (R26.81);Hemiplegia and hemiparesis;Low vision, both eyes (H54.2);Other symptoms and signs involving cognitive function Hemiplegia - Right/Left: Left Hemiplegia - dominant/non-dominant: Non-Dominant Hemiplegia - caused by: Cerebral infarction                Time: 1610-9604 OT Time Calculation (min): 41 min Charges:  OT General Charges $OT Visit: 1 Visit OT Evaluation $OT Eval Moderate Complexity: 1 Mod OT Treatments $Self Care/Home Management : 8-22 mins  Barry Brunner, OT Acute Rehabilitation Services Office [phone]   Chancy Milroy 10/03/2022, 11:55 AM

## 2022-10-03 NOTE — Progress Notes (Signed)
Physical Therapy Treatment Patient Details Name: [name] MRN: [id number] DOB: 04/23/1934 Today's Date: 10/03/2022   History of Present Illness 88 y.o. male admitted on 10/01/22 for double vision and hemianopsia.  LKW time was unknown, so no TNK was given, however, he did undergo IR thrombectomy of L P1 PCA occlusion shortly after admission. CTA revealed  P1 occlusion with stenosis of P2. MRI revealed small cute infracrts bil cerebellum (L>R) punctate acute lacunar infarcts in both left pons and thalamus, minimal occipital lobe restricted diffusion (R>L). PMH includes: ARF, anxiety, DM, HTN.    PT Comments    The pt was agreeable to session, able to demo good improvement in mobility tolerance due to reduced nausea this session. The pt required modA of 2 for mobility at this time to manage balance both with static stance and hallway ambulation. His balance and mobility is further impacted by vision deficits which make the pt at significantly increased risk of falls. Pt able to turn and navigate in hallway only with significant assist, often drifting to R due to limited vision on L and poor awareness to continually scan environment. Therefore continue to recommend intensive therapies after d/c to facilitate return to full independence.     Recommendations for follow up therapy are one component of a multi-disciplinary discharge planning process, led by the attending physician.  Recommendations may be updated based on patient status, additional functional criteria and insurance authorization.  Follow Up Recommendations       Assistance Recommended at Discharge Frequent or constant Supervision/Assistance  Patient can return home with the following A lot of help with walking and/or transfers;A lot of help with bathing/dressing/bathroom;Direct supervision/assist for medications management;Direct supervision/assist for financial management;Assist for transportation;Help with stairs or ramp for  entrance;Assistance with cooking/housework   Equipment Recommendations  Rolling walker (2 wheels)    Recommendations for Other Services Rehab consult     Precautions / Restrictions Precautions Precautions: Fall Precaution Comments: L visual deficits Restrictions Weight Bearing Restrictions: No     Mobility  Bed Mobility Overal bed mobility: Needs Assistance Bed Mobility: Supine to Sit     Supine to sit: Mod assist, HOB elevated     General bed mobility comments: trunk support and scooting foward    Transfers Overall transfer level: Needs assistance Equipment used: 2 person hand held assist Transfers: Sit to/from Stand Sit to Stand: Min assist, +2 physical assistance, +2 safety/equipment           General transfer comment: steadying support, min assist    Ambulation/Gait Ambulation/Gait assistance: Min assist, Mod assist, +2 physical assistance Gait Distance (Feet): 75 Feet Assistive device: 2 person hand held assist Gait Pattern/deviations: Step-to pattern, Decreased stride length, Narrow base of support, Staggering right Gait velocity: decreased     General Gait Details: pt with poor awareness and stability with gait, poor visual scanning to L needing max cues and often walking to R. BUE support for balance   Modified Rankin (Stroke Patients Only) Modified Rankin (Stroke Patients Only) Pre-Morbid Rankin Score: No symptoms Modified Rankin: Moderately severe disability     Balance Overall balance assessment: Needs assistance Sitting-balance support: No upper extremity supported, Feet supported Sitting balance-Leahy Scale: Fair Sitting balance - Comments: limited dynamically   Standing balance support: Bilateral upper extremity supported, During functional activity Standing balance-Leahy Scale: Poor Standing balance comment: relies on BUE and external support  Cognition Arousal/Alertness: Awake/alert Behavior  During Therapy: Flat affect Overall Cognitive Status: Impaired/Different from baseline Area of Impairment: Awareness, Safety/judgement, Orientation, Attention, Memory, Following commands, Problem solving                 Orientation Level: Disoriented to, Time Current Attention Level: Focused Memory: Decreased recall of precautions, Decreased short-term memory Following Commands: Follows one step commands consistently, Follows one step commands with increased time, Follows multi-step commands inconsistently Safety/Judgement: Decreased awareness of safety, Decreased awareness of deficits Awareness: Intellectual Problem Solving: Slow processing, Decreased initiation, Difficulty sequencing, Requires verbal cues, Requires tactile cues General Comments: patient disoriented to time, reports year is 31.  Able to correct age with cue from son.  Patient following simple commands with increased time, signifincantly decreased awareness of deficits and safety with poor problem solving.  Poor carryover of scnning techniques.        Exercises      General Comments General comments (skin integrity, edema, etc.): VSS on RA, son present and supportive      Pertinent Vitals/Pain Pain Assessment Pain Assessment: Faces Faces Pain Scale: Hurts a little bit Pain Location: headache Pain Descriptors / Indicators: Aching, Headache Pain Intervention(s): Monitored during session, Limited activity within patient's tolerance, Repositioned    Home Living Family/patient expects to be discharged to:: Private residence Living Arrangements: Alone Available Help at Discharge: Family;Available PRN/intermittently Type of Home: House Home Access: Stairs to enter Entrance Stairs-Rails: None Entrance Stairs-Number of Steps: 2   Home Layout: Two level;Full bath on main level;Able to live on main level with bedroom/bathroom Home Equipment: None          PT Goals (current goals can now be found in the care  plan section) Acute Rehab PT Goals Patient Stated Goal: go home, daughter is interested in looking into CIR level therapies if he qualifies PT Goal Formulation: With patient/family Time For Goal Achievement: 10/16/22 Potential to Achieve Goals: Good Progress towards PT goals: Progressing toward goals    Frequency    Min 4X/week      PT Plan Current plan remains appropriate    Co-evaluation PT/OT/SLP Co-Evaluation/Treatment: Yes Reason for Co-Treatment: For patient/therapist safety;Necessary to address cognition/behavior during functional activity;To address functional/ADL transfers PT goals addressed during session: Mobility/safety with mobility;Balance;Strengthening/ROM OT goals addressed during session: ADL's and self-care      AM-PAC PT "6 Clicks" Mobility   Outcome Measure  Help needed turning from your back to your side while in a flat bed without using bedrails?: A Lot Help needed moving from lying on your back to sitting on the side of a flat bed without using bedrails?: A Lot Help needed moving to and from a bed to a chair (including a wheelchair)?: A Lot Help needed standing up from a chair using your arms (e.g., wheelchair or bedside chair)?: A Lot Help needed to walk in hospital room?: A Lot Help needed climbing 3-5 steps with a railing? : Total 6 Click Score: 11    End of Session Equipment Utilized During Treatment: Gait belt Activity Tolerance: Patient tolerated treatment well Patient left: with call bell/phone within reach;with family/visitor present;in chair;with chair alarm set Nurse Communication: Mobility status PT Visit Diagnosis: Muscle weakness (generalized) (M62.81);Difficulty in walking, not elsewhere classified (R26.2);Pain     Time: 1610-9604 PT Time Calculation (min) (ACUTE ONLY): 40 min  Charges:  $Gait Training: 8-22 mins                     Vickki Muff,  PT, DPT   Acute Rehabilitation Department Office [phone] Secure Chat  Communication Preferred    Ronnie Derby 10/03/2022, 1:01 PM

## 2022-10-03 NOTE — Progress Notes (Signed)
Referring Physician(s): Amada Jupiter, [facility]/ Stroke team  Supervising Physician: Baldemar Lenis  Patient Status:  [facility] - In-pt  Chief Complaint:  Diplopia, hemianopia, CTA head showed occlusion of the proximal left P1 segment with bilateral ICA stenosis  S/p left P1/PCA Mechanical thrombectomy performed with direct contact aspiration with complete recanalization after one pass (TICI3) by Dr. Tommie Sams on 6/16.   Subjective:  Patient seen with Dr. Tommie Sams, he is sitting in a recliner, family at bedside.  States that he vision is slightly better.   Allergies: Ketorolac tromethamine  Medications: Prior to Admission medications   Medication Sig Start Date End Date Taking? Authorizing Provider  acebutolol (SECTRAL) 200 MG capsule Take 1 capsule by mouth twice daily 03/16/22  Yes Yates Decamp, MD  aspirin EC 81 MG tablet Take 81 mg by mouth daily.   Yes [provider]  atorvastatin (LIPITOR) 40 MG tablet Take 40 mg by mouth every evening.   Yes [provider]  furosemide (LASIX) 20 MG tablet Take 1 tablet by mouth daily as needed. Take in morning 12/21/10  Yes Bensimhon, [name], MD  JARDIANCE 25 MG TABS tablet Take 1 tablet by mouth every evening. 02/28/15  Yes [provider]  metFORMIN (GLUCOPHAGE) 1000 MG tablet 1 pill twice a day 12/28/19  Yes [provider]  MYRBETRIQ 50 MG TB24 tablet  06/08/20  Yes [provider]  olmesartan-hydrochlorothiazide (BENICAR HCT) 40-25 MG tablet TAKE 1 TABLET BY MOUTH ONCE DAILY IN THE MORNING 06/13/22  Yes Yates Decamp, MD  tadalafil (CIALIS) 5 MG tablet Take 5 mg by mouth daily.   Yes [provider]  tamsulosin (FLOMAX) 0.4 MG CAPS capsule Take 1 capsule by mouth daily after supper. 09/18/19  Yes [provider]     Vital Signs: BP (!) 145/80   Pulse 75   Temp 98.2 F (36.8 C) (Oral)   Resp (!) 21   Wt 175 lb (79.4 kg)   SpO2 94%   BMI 25.84  kg/m   Physical Exam Vitals reviewed.  Constitutional:      General: He is not in acute distress.    Appearance: He is not ill-appearing.  HENT:     Head: Normocephalic.  Cardiovascular:     Comments: Good right RA pulse distal and proximal to the puncture site  Pulmonary:     Effort: Pulmonary effort is normal.  Musculoskeletal:     Cervical back: Neck supple.  Skin:    General: Skin is warm and dry.  Neurological:     Mental Status: He is alert and oriented to person, place, and time.     Comments: Left hemianopia   Psychiatric:        Mood and Affect: Mood normal.        Behavior: Behavior normal.     Imaging: ECHOCARDIOGRAM COMPLETE  Result Date: 10/02/2022    ECHOCARDIOGRAM REPORT   Patient Name:   [name] Date of Exam: 10/02/2022 Medical Rec #:  [id number]        Height:       69.0 in Accession #:    [id number]       Weight:       175.0 lb Date of Birth:  08-11-1934        BSA:          1.952 m Patient Age:    88 years         BP:  135/71 mmHg Patient Gender: M                HR:           74 bpm. Exam Location:  Inpatient Procedure: 2D Echo, Cardiac Doppler, Color Doppler and Intracardiac            Opacification Agent Indications:    Stroke  History:        Patient has prior history of Echocardiogram examinations, most                 recent 11/18/2019.  Sonographer:    Sheralyn Boatman RDCS Referring Phys: [phone] [facility]  Sonographer Comments: Technically difficult study due to poor echo windows. Image acquisition challenging due to patient body habitus. Study delayed for patient care. IMPRESSIONS  1. Left ventricular ejection fraction, by estimation, is 60 to 65%. The left ventricle has normal function. The left ventricle has no regional wall motion abnormalities. Left ventricular diastolic parameters are indeterminate.  2. Right ventricular systolic function is mildly reduced. The right ventricular size is normal. Tricuspid regurgitation signal is  inadequate for assessing PA pressure. The estimated right ventricular systolic pressure is 37.8 mmHg.  3. Left atrial size was mild to moderately dilated.  4. The mitral valve is degenerative. Mild mitral valve regurgitation. No evidence of mitral stenosis.  5. The aortic valve is grossly normal. There is mild calcification of the aortic valve. There is mild thickening of the aortic valve. Aortic valve regurgitation is not visualized. No aortic stenosis is present.  6. The inferior vena cava is dilated in size with <50% respiratory variability, suggesting right atrial pressure of 15 mmHg. FINDINGS  Left Ventricle: Left ventricular ejection fraction, by estimation, is 60 to 65%. The left ventricle has normal function. The left ventricle has no regional wall motion abnormalities. Definity contrast agent was given IV to delineate the left ventricular  endocardial borders. The left ventricular internal cavity size was normal in size. There is no left ventricular hypertrophy. Left ventricular diastolic parameters are indeterminate. Right Ventricle: The right ventricular size is normal. No increase in right ventricular wall thickness. Right ventricular systolic function is mildly reduced. Tricuspid regurgitation signal is inadequate for assessing PA pressure. The tricuspid regurgitant velocity is 2.39 m/s, and with an assumed right atrial pressure of 15 mmHg, the estimated right ventricular systolic pressure is 37.8 mmHg. Left Atrium: Left atrial size was mild to moderately dilated. Right Atrium: Right atrial size was normal in size. Pericardium: There is no evidence of pericardial effusion. Presence of epicardial fat layer. Mitral Valve: The mitral valve is degenerative in appearance. There is mild calcification of the mitral valve leaflet(s). Mild mitral annular calcification. Mild mitral valve regurgitation. No evidence of mitral valve stenosis. Tricuspid Valve: The tricuspid valve is normal in structure. Tricuspid  valve regurgitation is trivial. No evidence of tricuspid stenosis. Aortic Valve: The aortic valve is grossly normal. There is mild calcification of the aortic valve. There is mild thickening of the aortic valve. Aortic valve regurgitation is not visualized. No aortic stenosis is present. Pulmonic Valve: The pulmonic valve was normal in structure. Pulmonic valve regurgitation is not visualized. No evidence of pulmonic stenosis. Aorta: The aortic root is normal in size and structure. Venous: The inferior vena cava is dilated in size with less than 50% respiratory variability, suggesting right atrial pressure of 15 mmHg. IAS/Shunts: No atrial level shunt detected by color flow Doppler.  LEFT VENTRICLE PLAX 2D LVIDd:  4.00 cm   Diastology LVIDs:         2.40 cm   LV e' medial:    7.62 cm/s LV PW:         1.10 cm   LV E/e' medial:  16.0 LV IVS:        1.10 cm   LV e' lateral:   8.38 cm/s LVOT diam:     2.40 cm   LV E/e' lateral: 14.6 LV SV:         74 LV SV Index:   38 LVOT Area:     4.52 cm  RIGHT VENTRICLE            IVC RV S prime:     4.57 cm/s  IVC diam: 2.10 cm TAPSE (M-mode): 0.5 cm LEFT ATRIUM             Index        RIGHT ATRIUM           Index LA diam:        4.30 cm 2.20 cm/m   RA Area:     10.20 cm LA Vol (A2C):   38.7 ml 19.83 ml/m  RA Volume:   21.30 ml  10.91 ml/m LA Vol (A4C):   44.7 ml 22.90 ml/m LA Biplane Vol: 45.2 ml 23.16 ml/m  AORTIC VALVE LVOT Vmax:   81.70 cm/s LVOT Vmean:  53.100 cm/s LVOT VTI:    0.164 m  AORTA Ao Root diam: 3.00 cm Ao Asc diam:  3.50 cm MITRAL VALVE                TRICUSPID VALVE MV Area (PHT): 4.15 cm     TR Peak grad:   22.8 mmHg MV Decel Time: 183 msec     TR Vmax:        239.00 cm/s MV E velocity: 122.00 cm/s                             SHUNTS                             Systemic VTI:  0.16 m                             Systemic Diam: 2.40 cm Weston Brass MD Electronically signed by Weston Brass MD Signature Date/Time: 10/02/2022/3:39:50 PM    Final     MR BRAIN WO CONTRAST  Result Date: 10/02/2022 CLINICAL DATA:  88 year old male code stroke presentation yesterday, short segment left P1 occlusion and clot protruding into the distal basilar artery. Status post mechanical thrombectomy. EXAM: MRI HEAD WITHOUT CONTRAST TECHNIQUE: Multiplanar, multiecho pulse sequences of the brain and surrounding structures were obtained without intravenous contrast. COMPARISON:  CT head, CTA and CTP yesterday. FINDINGS: Brain: Patchy restricted diffusion in both cerebellar hemispheres, more so on the left at the AICA and/or SCA territories (series 2, image 15). Subtle small focus of restricted diffusion in the left superior pons just below the midbrain best seen on series 3, image 17. No other convincing brainstem restricted diffusion. Similar small focus in the left lateral thalamus series 2, image 28. And there is an adjacent chronic left thalamic lacunar infarct. DWI edge artifact throughout the cerebral hemispheres, but there could be small foci of abnormal occipital pole diffusion, possibly also some right occipital white  matter diffusion restriction on series 2, image 19. Subtle T2/FLAIR hyperintensity at the affected areas. No hemorrhagic transformation or mass effect. Nonspecific ventriculomegaly which might be cerebral volume loss related. Nomidline shift, mass effect, evidence of mass lesion, ventriculomegaly, extra-axial collection or acute intracranial hemorrhage. Cervicomedullary junction and pituitary are within normal limits. Patchy and confluent bilateral cerebral white matter T2 and FLAIR hyperintensity. Chronic left thalamic lacunar infarct as stated above, otherwise deep gray nuclei T2 heterogeneity appears mostly due to perivascular spaces. Chronic microhemorrhages also in both thalami on SWI, and in the right corona radiata. Vascular: Major intracranial vascular flow voids are preserved. Skull and upper cervical spine: Negative for age visible cervical  spine. Visualized bone marrow signal is within normal limits. Sinuses/Orbits: Postoperative changes to both globes. Paranasal sinus mucoperiosteal thickening again noted. Other: Mastoids are well aerated. Grossly normal visible internal auditory structures. IMPRESSION: 1. Small acute infarcts scattered in the bilateral cerebellum (left greater than right). Punctate acute lacunar infarcts in both the left pons and thalamus. And minimal occipital lobe restricted diffusion, perhaps greater on the right. 2. No hemorrhagic transformation or intracranial mass effect. 3. Underlying chronic small vessel disease, including chronic thalamic microhemorrhages, and nonspecific ventricular enlargement. Electronically Signed   By: Odessa Fleming M.D.   On: 10/02/2022 05:46   CT CEREBRAL PERFUSION W CONTRAST  Result Date: 10/01/2022 CLINICAL DATA:  Diplopia, stroke suspected EXAM: CT ANGIOGRAPHY HEAD AND NECK CT PERFUSION BRAIN TECHNIQUE: Multidetector CT imaging of the head and neck was performed using the standard protocol during bolus administration of intravenous contrast. Multiplanar CT image reconstructions and MIPs were obtained to evaluate the vascular anatomy. Carotid stenosis measurements (when applicable) are obtained utilizing NASCET criteria, using the distal internal carotid diameter as the denominator. Multiphase CT imaging of the brain was performed following IV bolus contrast injection. Subsequent parametric perfusion maps were calculated using RAPID software. RADIATION DOSE REDUCTION: This exam was performed according to the departmental dose-optimization program which includes automated exposure control, adjustment of the mA and/or kV according to patient size and/or use of iterative reconstruction technique. CONTRAST:  OMNIPAQUE IOHEXOL 350 MG/ML SOLN COMPARISON:  No prior CTA available, correlation is made with CT head 10/01/2022 FINDINGS: CT HEAD FINDINGS For noncontrast findings, please see same day CT  head. CTA NECK FINDINGS Aortic arch: Four-vessel arch, with the left vertebral artery originating from the aorta. Imaged portion shows no evidence of aneurysm or dissection. No significant stenosis of the major arch vessel origins. Aortic atherosclerosis, with significant noncalcified and possibly ulcerated plaque. Right carotid system: No evidence of dissection, occlusion, or hemodynamically significant stenosis (greater than 50%). Retropharyngeal course of the proximal right ICA. Left carotid system: No evidence of dissection, occlusion, or hemodynamically significant stenosis (greater than 50%). Vertebral arteries: No evidence of dissection, occlusion, or hemodynamically significant stenosis (greater than 50%). Skeleton: No acute osseous abnormality. Degenerative changes in the cervical spine. Status post median sternotomy. Other neck: No acute finding. Upper chest: No focal pulmonary opacity or pleural effusion. Review of the MIP images confirms the above findings CTA HEAD FINDINGS Anterior circulation: Both internal carotid arteries are patent to the termini, with mild stenosis in the left cavernous segment, severe stenosis in the left supraclinoid segment proximally, and moderate stenosis in the right supraclinoid segment. A1 segments patent. Normal anterior communicating artery. Anterior cerebral arteries are patent to their distal aspects without significant stenosis. No M1 stenosis or occlusion. MCA branches perfused to their distal aspects without significant stenosis. Posterior circulation: Vertebral arteries patent  to the vertebrobasilar junction without significant stenosis. Severe stenosis at the origin of the right PICA (series 7, image 136). The left PICA is patent. Basilar patent to its distal aspect with mild stenosis in the distal basilar (series 7, image 120). Superior cerebellar arteries patent proximally. Short segment occlusion of the proximal left P 1 segment (series 7, image 108). Additional  moderate stenosis in the distal left P2 segment (series 7, image 112). Patent right P1 segment. The right PCA branches are patent to to their distal aspects with some irregularity but without significant stenosis. The right posterior communicating artery is patent. The left posterior communicating artery is not visualized. Venous sinuses: As permitted by contrast timing, patent. Anatomic variants: None significant. Review of the MIP images confirms the above findings CT Brain Perfusion Findings: ASPECTS: 10 CBF (<30%) Volume: 0mL Perfusion (Tmax>6.0s) volume: 19mL Mismatch Volume: 19mL Infarction Location:No infarct core identified. Possible area of decreased perfusion in the superior left cerebellum, along the posterior and lateral aspects, or left occipital lobe. IMPRESSION: 1. Short segment occlusion of the proximal left P1 segment, with additional moderate stenosis in the distal left P2 segment. 2. No infarct core identified. Possible area of decreased perfusion in the superior left cerebellum or left occipital lobe. This could reflect decreased perfusion related to short segment left P1 occlusion. Superior cerebellar decreased perfusion 3. Severe stenosis in the left supraclinoid ICA and moderate stenosis in the right supraclinoid ICA. 4. Severe stenosis at the origin of the right PICA. Mild stenosis in the distal basilar artery. 5. No hemodynamically significant stenosis in the neck. 6. Aortic atherosclerosis. Aortic Atherosclerosis (ICD10-I70.0). These results were called by telephone at the time of interpretation on 10/01/2022 at 10:59 pm to provider [facility] , who verbally acknowledged these results. Electronically Signed   By: Wiliam Ke M.D.   On: 10/01/2022 22:59   CT ANGIO HEAD NECK W WO CM  Result Date: 10/01/2022 CLINICAL DATA:  Diplopia, stroke suspected EXAM: CT ANGIOGRAPHY HEAD AND NECK CT PERFUSION BRAIN TECHNIQUE: Multidetector CT imaging of the head and neck was performed using  the standard protocol during bolus administration of intravenous contrast. Multiplanar CT image reconstructions and MIPs were obtained to evaluate the vascular anatomy. Carotid stenosis measurements (when applicable) are obtained utilizing NASCET criteria, using the distal internal carotid diameter as the denominator. Multiphase CT imaging of the brain was performed following IV bolus contrast injection. Subsequent parametric perfusion maps were calculated using RAPID software. RADIATION DOSE REDUCTION: This exam was performed according to the departmental dose-optimization program which includes automated exposure control, adjustment of the mA and/or kV according to patient size and/or use of iterative reconstruction technique. CONTRAST:  OMNIPAQUE IOHEXOL 350 MG/ML SOLN COMPARISON:  No prior CTA available, correlation is made with CT head 10/01/2022 FINDINGS: CT HEAD FINDINGS For noncontrast findings, please see same day CT head. CTA NECK FINDINGS Aortic arch: Four-vessel arch, with the left vertebral artery originating from the aorta. Imaged portion shows no evidence of aneurysm or dissection. No significant stenosis of the major arch vessel origins. Aortic atherosclerosis, with significant noncalcified and possibly ulcerated plaque. Right carotid system: No evidence of dissection, occlusion, or hemodynamically significant stenosis (greater than 50%). Retropharyngeal course of the proximal right ICA. Left carotid system: No evidence of dissection, occlusion, or hemodynamically significant stenosis (greater than 50%). Vertebral arteries: No evidence of dissection, occlusion, or hemodynamically significant stenosis (greater than 50%). Skeleton: No acute osseous abnormality. Degenerative changes in the cervical spine. Status post median sternotomy. Other  neck: No acute finding. Upper chest: No focal pulmonary opacity or pleural effusion. Review of the MIP images confirms the above findings CTA HEAD FINDINGS  Anterior circulation: Both internal carotid arteries are patent to the termini, with mild stenosis in the left cavernous segment, severe stenosis in the left supraclinoid segment proximally, and moderate stenosis in the right supraclinoid segment. A1 segments patent. Normal anterior communicating artery. Anterior cerebral arteries are patent to their distal aspects without significant stenosis. No M1 stenosis or occlusion. MCA branches perfused to their distal aspects without significant stenosis. Posterior circulation: Vertebral arteries patent to the vertebrobasilar junction without significant stenosis. Severe stenosis at the origin of the right PICA (series 7, image 136). The left PICA is patent. Basilar patent to its distal aspect with mild stenosis in the distal basilar (series 7, image 120). Superior cerebellar arteries patent proximally. Short segment occlusion of the proximal left P 1 segment (series 7, image 108). Additional moderate stenosis in the distal left P2 segment (series 7, image 112). Patent right P1 segment. The right PCA branches are patent to to their distal aspects with some irregularity but without significant stenosis. The right posterior communicating artery is patent. The left posterior communicating artery is not visualized. Venous sinuses: As permitted by contrast timing, patent. Anatomic variants: None significant. Review of the MIP images confirms the above findings CT Brain Perfusion Findings: ASPECTS: 10 CBF (<30%) Volume: 0mL Perfusion (Tmax>6.0s) volume: 19mL Mismatch Volume: 19mL Infarction Location:No infarct core identified. Possible area of decreased perfusion in the superior left cerebellum, along the posterior and lateral aspects, or left occipital lobe. IMPRESSION: 1. Short segment occlusion of the proximal left P1 segment, with additional moderate stenosis in the distal left P2 segment. 2. No infarct core identified. Possible area of decreased perfusion in the superior  left cerebellum or left occipital lobe. This could reflect decreased perfusion related to short segment left P1 occlusion. Superior cerebellar decreased perfusion 3. Severe stenosis in the left supraclinoid ICA and moderate stenosis in the right supraclinoid ICA. 4. Severe stenosis at the origin of the right PICA. Mild stenosis in the distal basilar artery. 5. No hemodynamically significant stenosis in the neck. 6. Aortic atherosclerosis. Aortic Atherosclerosis (ICD10-I70.0). These results were called by telephone at the time of interpretation on 10/01/2022 at 10:59 pm to provider [facility] , who verbally acknowledged these results. Electronically Signed   By: Wiliam Ke M.D.   On: 10/01/2022 22:59   CT HEAD CODE STROKE WO CONTRAST  Result Date: 10/01/2022 CLINICAL DATA:  Code stroke.  Double vision EXAM: CT HEAD WITHOUT CONTRAST TECHNIQUE: Contiguous axial images were obtained from the base of the skull through the vertex without intravenous contrast. RADIATION DOSE REDUCTION: This exam was performed according to the departmental dose-optimization program which includes automated exposure control, adjustment of the mA and/or kV according to patient size and/or use of iterative reconstruction technique. COMPARISON:  03/11/2017 FINDINGS: Brain: No evidence of acute infarction, hemorrhage, mass, mass effect, or midline shift. No hydrocephalus or extra-axial collection. Age related cerebral atrophy. Ex vacuo dilatation of the ventricles. Periventricular white matter changes, likely the sequela of chronic small vessel ischemic disease. Vascular: No hyperdense vessel. Skull: Negative for fracture or focal lesion. Sinuses/Orbits: Chronic bilateral maxillary sinusitis. Mucosal thickening in the ethmoid air cells. Status post bilateral lens replacements. Dysconjugate gaze Other: The mastoid air cells are well aerated. ASPECTS [facility] Stroke Program Early CT Score) - Ganglionic level infarction (caudate,  lentiform nuclei, internal capsule, insula, M1-M3 cortex): 7 -  Supraganglionic infarction (M4-M6 cortex): 3 Total score (0-10 with 10 being normal): 10 IMPRESSION: 1. No acute intracranial process. 2. ASPECTS is 10. Code stroke imaging results were communicated on 10/01/2022 at 9:23 pm to provider [facility] via telephone, who verbally acknowledged these results. Electronically Signed   By: Wiliam Ke M.D.   On: 10/01/2022 21:24    Labs:  CBC: Recent Labs    10/01/22 2057 10/03/22 0815  WBC 7.5 10.1  HGB 14.9 13.7  HCT 43.8 40.6  PLT 180 161    COAGS: Recent Labs    10/01/22 2115  INR 1.0  APTT 27    BMP: Recent Labs    10/01/22 2057  NA 136  K 4.0  CL 99  CO2 28  GLUCOSE 169*  BUN 28*  CALCIUM 9.9  CREATININE 1.39*  GFRNONAA 49*    LIVER FUNCTION TESTS: Recent Labs    10/01/22 2057  BILITOT 0.5  AST 20  ALT 15  ALKPHOS 64  PROT 7.7  ALBUMIN 4.3    Assessment and Plan:  88 y.o. male who developed diplopia and hemianopia, CTA head showed occlusion of the proximal left P1 segment with bilateral ICA stenosis.  S/p left P1/PCA Mechanical thrombectomy performed with direct contact aspiration with complete recanalization after one pass (TICI3) by Dr. Tommie Sams on 6/16.    MR brain w/o  on 6/17 showed:  1. Small acute infarcts scattered in the bilateral cerebellum (left greater than right). Punctate acute lacunar infarcts in both the left pons and thalamus. And minimal occipital lobe restricted diffusion, perhaps greater on the right. 2. No hemorrhagic transformation or intracranial mass effect. 3. Underlying chronic small vessel disease, including chronic thalamic microhemorrhages, and nonspecific ventricular enlargement.  On ASA 81 mg every day and Plavix 75 mg every day per stroke team.  Still has left hemianopia, but had good strength in right extremities.   Further treatment plan per stroke team.  Appreciate and agree with the plan.  Please  call NIR for questions and concerns.    Electronically Signed: Willette Brace, PA-C 10/03/2022, 8:55 AM   I spent a total of 15 Minutes at the the patient's bedside AND on the patient's hospital floor or unit, greater than 50% of which was counseling/coordinating care for left P1/PCA thrombectomy.   This chart was dictated using voice recognition software.  Despite best efforts to proofread,  errors can occur which can change the documentation meaning.

## 2022-10-03 NOTE — Progress Notes (Signed)
ANTICOAGULATION CONSULT NOTE  Pharmacy Consult for Heparin Indication: atrial fibrillation, also acute stroke  Allergies  Allergen Reactions   Ketorolac Tromethamine Other (See Comments)    nephrotic syndrome    Patient Measurements: Weight: 79.4 kg (175 lb) Heparin Dosing Weight: 79.4 kg  Vital Signs: Temp: 98.2 F (36.8 C) (06/17 0400) Temp Source: Oral (06/17 0400) BP: 145/80 (06/17 0700) Pulse Rate: 75 (06/17 0700)  Labs: Recent Labs    10/01/22 2057 10/01/22 2115 10/03/22 0815  HGB 14.9  --  13.7  HCT 43.8  --  40.6  PLT 180  --  161  APTT  --  27  --   LABPROT  --  13.6  --   INR  --  1.0  --   HEPARINUNFRC  --   --  <0.10*  CREATININE 1.39*  --   --      Estimated Creatinine Clearance: 38.1 mL/min (A) (by C-G formula based on SCr of 1.39 mg/dL (H)).   Assessment: 88 y.o. M presents with acute stroke - likely cardioembolic. Pt remains in afib. Consulted by Dr. Jacinto Halim for low dose heparin for afib in stroke pt.   Heparin level undetectable.  No issue with infusion nor bleeding per discussion with RN.  Goal of Therapy:  Heparin level 0.3-0.5 units/ml Monitor platelets by anticoagulation protocol: Yes   Plan:  No bolus with CVA Increase heparin gtt to 950 units/hr Check heparin level in 8 hours Daily heparin level and CBC  Leina Babe D. Laney Potash, PharmD, BCPS, BCCCP 10/03/2022, 9:50 AM

## 2022-10-03 NOTE — Progress Notes (Signed)
ANTICOAGULATION CONSULT NOTE  Pharmacy Consult for Heparin Indication: atrial fibrillation, also acute stroke  Allergies  Allergen Reactions   Ketorolac Tromethamine Other (See Comments)    nephrotic syndrome    Patient Measurements: Weight: 79.4 kg (175 lb) Heparin Dosing Weight: 79.4 kg  Vital Signs: Temp: 98 F (36.7 C) (06/17 1629) Temp Source: Oral (06/17 1220) BP: 156/79 (06/17 1629) Pulse Rate: 73 (06/17 1629)  Labs: Recent Labs    10/01/22 2057 10/01/22 2115 10/03/22 0815 10/03/22 1859  HGB 14.9  --  13.7  --   HCT 43.8  --  40.6  --   PLT 180  --  161  --   APTT  --  27  --   --   LABPROT  --  13.6  --   --   INR  --  1.0  --   --   HEPARINUNFRC  --   --  <0.10* 0.39  CREATININE 1.39*  --   --   --      Estimated Creatinine Clearance: 38.1 mL/min (A) (by C-G formula based on SCr of 1.39 mg/dL (H)).   Assessment: 88 y.o. M presents with acute stroke - likely cardioembolic. Pt remains in afib. Consulted by Dr. Jacinto Halim for low dose heparin for afib in stroke pt.   Heparin level therapeutic at 0.39. No overt bleeding noted.   Goal of Therapy:  Heparin level 0.3-0.5 units/ml Monitor platelets by anticoagulation protocol: Yes   Plan:  Continue heparin at 950u/hr.  Recheck anti-Xa in AM.  Daily heparin level and CBC  Estill Batten, PharmD, BCCCP  10/03/2022, 7:47 PM

## 2022-10-04 DIAGNOSIS — I6340 Cerebral infarction due to embolism of unspecified cerebral artery: Secondary | ICD-10-CM

## 2022-10-04 DIAGNOSIS — H5347 Heteronymous bilateral field defects: Secondary | ICD-10-CM | POA: Diagnosis not present

## 2022-10-04 LAB — GLUCOSE, CAPILLARY
Glucose-Capillary: 156 mg/dL — ABNORMAL HIGH (ref 70–99)
Glucose-Capillary: 175 mg/dL — ABNORMAL HIGH (ref 70–99)
Glucose-Capillary: 203 mg/dL — ABNORMAL HIGH (ref 70–99)
Glucose-Capillary: 173 mg/dL — ABNORMAL HIGH (ref 70–99)

## 2022-10-04 LAB — BASIC METABOLIC PANEL
Anion gap: 15 (ref 5–15)
BUN: 26 mg/dL — ABNORMAL HIGH (ref 8–23)
CO2: 22 mmol/L (ref 22–32)
Calcium: 9 mg/dL (ref 8.9–10.3)
Chloride: 99 mmol/L (ref 98–111)
Creatinine, Ser: 1.42 mg/dL — ABNORMAL HIGH (ref 0.61–1.24)
GFR, Estimated: 48 mL/min — ABNORMAL LOW (ref >60)
Glucose, Bld: 166 mg/dL — ABNORMAL HIGH (ref 70–99)
Potassium: 3.9 mmol/L (ref 3.5–5.1)
Sodium: 136 mmol/L (ref 135–145)

## 2022-10-04 LAB — CBC
HCT: 38.5 % — ABNORMAL LOW (ref 39.0–52.0)
Hemoglobin: 13 g/dL (ref 13.0–17.0)
MCH: 31.7 pg (ref 26.0–34.0)
MCHC: 33.8 g/dL (ref 30.0–36.0)
MCV: 93.9 fL (ref 80.0–100.0)
Platelets: 154 10*3/uL (ref 150–400)
RBC: 4.1 MIL/uL — ABNORMAL LOW (ref 4.22–5.81)
RDW: 12.9 % (ref 11.5–15.5)
WBC: 9.8 10*3/uL (ref 4.0–10.5)
nRBC: 0 % (ref 0.0–0.2)

## 2022-10-04 LAB — HEPARIN LEVEL (UNFRACTIONATED): Heparin Unfractionated: 0.34 IU/mL (ref 0.30–0.70)

## 2022-10-04 MED ORDER — RIVAROXABAN 20 MG PO TABS: 20.0000 mg | ORAL_TABLET | Freq: Every day | ORAL | Status: DC

## 2022-10-04 MED ORDER — RIVAROXABAN 15 MG PO TABS
15.0000 mg | ORAL_TABLET | Freq: Every day | ORAL | Status: DC
  Administered 2022-10-04: 15 mg via ORAL
  Filled 2022-10-04: qty 1

## 2022-10-04 NOTE — Progress Notes (Signed)
Inpatient Rehab Admissions Coordinator:    I met with pt. And daughter, Orlie Pollen, at bedside to discuss potential CIR admit Pt. Is interested, daughter states family can rotate to provide 24/7 assist at d/c. I will send case to insurance once MD  consult is in.   Megan Salon, MS, CCC-SLP Rehab Admissions Coordinator  [phone] (celll) [phone] (office)

## 2022-10-04 NOTE — Consult Note (Signed)
CARDIOLOGY CONSULT NOTE  Patient ID: [name] MRN: [id number] DOB/AGE: [birth year] 88 y.o.  Admit date: 10/01/2022 Referring Physician  Delia Heady, MD Primary Physician:  Cleatis Polka., MD Reason for Consultation  A. Flutter  Patient ID: [name], male    DOB: [birth year], 88 y.o.   MRN: [id number]  Chief Complaint  Patient presents with   Diplopia   Code Stroke   HPI:    [name]  is a 88 y.o. male with history of CAD s/p CABG  in 2008 with LIMA to LAD, sequential saphenous vein graft to first second and third obtuse marginals, and sequential saphenous vein graft to the RCA and right PDA. He has history of hypertension, hyperlipidemia, diabetes mellitus, GERD and esophageal and gastric ulcers diagnosed in Jan 2023 for dysphagia.  He has not had any bleeding diathesis and doing well.  Due to recurrent syncope underwent loop recorder plantation on 07/28/2021.   Patient presented on 10/01/2022 with visual disturbances, has this to her left facial droop, trace left arm weakness, left hemianopia and found to have multiple scattered bilateral ischemic infarcts status post thrombectomy of the left P1 segment of the posterior cerebral artery.  Patient upon admission was found to have atypical atrial flutter.  He is presently doing well, states that his left upper extremity weakness is completely resolved.  He still has visual disturbance.  His daughter is present.  Denies palpitations, chest pain or dyspnea.  Past Medical History:  Diagnosis Date   Acute renal failure (HCC)    secondary to nephrotic syndrome post cath, resolved with steroids   Anxiety    CAD (coronary artery disease) 04/2006   LIMA to LAD, sequential SVG to first, second, and third OM's, sequential SVG to mid RCA and PDA    Depression    Diabetes mellitus without complication [facility])    ED (erectile dysfunction)    HTN (hypertension)    Hx of adenomatous colonic polyps    Hyperlipidemia    Loop  recorder: Biotronic Biomonitor II loop 07/28/2021 07/28/2021   Obesity    Past Surgical History:  Procedure Laterality Date   COLONOSCOPY W/ BIOPSIES AND POLYPECTOMY     CORONARY ARTERY BYPASS GRAFT  2008   VESSELS X6   LOOP RECORDER IMPLANT     RADIOLOGY WITH ANESTHESIA N/A 10/02/2022   Procedure: IR WITH ANESTHESIA;  Surgeon: Radiologist, Medication, MD;  Location: [facility] OR;  Service: Radiology;  Laterality: N/A;   UMBILICAL HERNIA REPAIR     Social History   Tobacco Use   Smoking status: Never   Smokeless tobacco: Never  Substance Use Topics   Alcohol use: No    Alcohol/week: 1.0 standard drink of alcohol    Types: 1 Glasses of wine per week    Family History  Problem Relation Age of Onset   Heart attack Mother    Clotting disorder Father        died of renal disease   Heart disease Sister    Cancer Sister    Colon cancer Neg Hx     Marital Status: Widowed  ROS  Review of Systems  Eyes:  Positive for visual disturbance.  Cardiovascular:  Negative for chest pain, dyspnea on exertion and leg swelling.  Neurological:  Positive for focal weakness.   Objective      10/04/2022    3:00 AM 10/04/2022   12:56 AM 10/03/2022    9:37 PM  Vitals with BMI  Systolic 131 137  142  Diastolic 67 72 77  Pulse 75 76 75    Blood pressure 131/67, pulse 75, temperature 97.7 F (36.5 C), temperature source Oral, resp. rate 18, weight 79.4 kg, SpO2 99 %.    Physical Exam Neck:     Vascular: No carotid bruit or JVD.  Cardiovascular:     Rate and Rhythm: Normal rate and regular rhythm.     Pulses:          Dorsalis pedis pulses are 0 on the right side and 0 on the left side.       Posterior tibial pulses are 0 on the right side and 0 on the left side.     Heart sounds: Normal heart sounds. No murmur heard.    No gallop.  Pulmonary:     Effort: Pulmonary effort is normal.     Breath sounds: Normal breath sounds.  Abdominal:     General: Bowel sounds are normal.     Palpations:  Abdomen is soft.  Musculoskeletal:     Right lower leg: No edema.     Left lower leg: No edema.    Laboratory examination:   Recent Labs    10/01/22 2057  NA 136  K 4.0  CL 99  CO2 28  GLUCOSE 169*  BUN 28*  CREATININE 1.39*  CALCIUM 9.9  GFRNONAA 49*   estimated creatinine clearance is 38.1 mL/min (A) (by C-G formula based on SCr of 1.39 mg/dL (H)).     Latest Ref Rng & Units 10/01/2022    8:57 PM 09/28/2021    7:10 PM 10/11/2009    8:52 AM  CMP  Glucose 70 - 99 mg/dL 161  096  045   BUN 8 - 23 mg/dL 28  28  21    Creatinine 0.61 - 1.24 mg/dL 4.09  8.11  9.14   Sodium 135 - 145 mmol/L 136  134  137   Potassium 3.5 - 5.1 mmol/L 4.0  4.1  4.3   Chloride 98 - 111 mmol/L 99  98  105   CO2 22 - 32 mmol/L 28  25  27    Calcium 8.9 - 10.3 mg/dL 9.9  78.2  8.6   Total Protein 6.5 - 8.1 g/dL 7.7  8.1  6.5   Total Bilirubin 0.3 - 1.2 mg/dL 0.5  0.8  0.7   Alkaline Phos 38 - 126 U/L 64  61  48   AST 15 - 41 U/L 20  20  24    ALT 0 - 44 U/L 15  18  22        Latest Ref Rng & Units 10/04/2022    4:06 AM 10/03/2022    8:15 AM 10/01/2022    8:57 PM  CBC  WBC 4.0 - 10.5 K/uL 9.8  10.1  7.5   Hemoglobin 13.0 - 17.0 g/dL 95.6  21.3  08.6   Hematocrit 39.0 - 52.0 % 38.5  40.6  43.8   Platelets 150 - 400 K/uL 154  161  180    Lipid Panel Recent Labs    10/02/22 0227  CHOL 136  TRIG 68  LDLCALC 77  VLDL 14  HDL 45  CHOLHDL 3.0    HEMOGLOBIN A1C Lab Results  Component Value Date   HGBA1C 8.4 (H) 10/02/2022   MPG 194.38 10/02/2022   TSH Labs 10/20/2021:  TSH normal at 0.64.  Medications and allergies   Allergies  Allergen Reactions   Ketorolac Tromethamine Other (See Comments)  nephrotic syndrome     Current Meds  Medication Sig   acebutolol (SECTRAL) 200 MG capsule Take 1 capsule by mouth twice daily   aspirin EC 81 MG tablet Take 81 mg by mouth daily.   atorvastatin (LIPITOR) 40 MG tablet Take 40 mg by mouth every evening.   furosemide (LASIX) 20 MG tablet Take  1 tablet by mouth daily as needed. Take in morning   JARDIANCE 25 MG TABS tablet Take 1 tablet by mouth every evening.   metFORMIN (GLUCOPHAGE) 1000 MG tablet 1 pill twice a day   MYRBETRIQ 50 MG TB24 tablet    olmesartan-hydrochlorothiazide (BENICAR HCT) 40-25 MG tablet TAKE 1 TABLET BY MOUTH ONCE DAILY IN THE MORNING   tadalafil (CIALIS) 5 MG tablet Take 5 mg by mouth daily.   tamsulosin (FLOMAX) 0.4 MG CAPS capsule Take 1 capsule by mouth daily after supper.   [DISCONTINUED] ZETIA 10 MG tablet Take 1 tablet by mouth every morning.    Scheduled Meds:  aspirin EC  81 mg Oral Daily   atorvastatin  80 mg Oral Daily   clopidogrel  75 mg Oral Daily   ezetimibe  10 mg Oral q morning   insulin aspart  0-15 Units Subcutaneous TID WC   insulin aspart  0-5 Units Subcutaneous QHS   mirabegron ER  50 mg Oral Daily   mupirocin ointment  1 Application Nasal BID   senna-docusate  2 tablet Oral QHS   tadalafil  5 mg Oral Daily   tamsulosin  0.4 mg Oral QPC supper   Continuous Infusions:  heparin 950 Units/hr (10/04/22 0055)   PRN Meds:.acetaminophen **OR** acetaminophen (TYLENOL) oral liquid 160 mg/5 mL **OR** acetaminophen, hydrALAZINE, melatonin, nitroGLYCERIN 200 mcg in sodium chloride (PF) 0.9 % 59.74 mL (25 mcg/mL) syringe, ondansetron (ZOFRAN) IV, mouth rinse, verapamil   I/O last 3 completed shifts: In: 452.9 [P.O.:360; I.V.:92.9] Out: 1050 [Urine:1050] No intake/output data recorded.  Net IO Since Admission: -1,022.27 mL [10/04/22 0727]   Radiology:   CT angiogram chest 09/28/2021: 1. No aortic aneurysm or dissection. 2. No pulmonary artery embolus identified. 3. Cholelithiasis with mild thickened appearance of the gallbladder wall. Correlation with ultrasound is recommended to exclude acute cholecystitis. 4. Fatty liver. 5. There is a 3 mm right upper lobe subpleural nodule. No follow-up needed if patient is low-risk.T  MRI of the brain 10/02/2022: 1. Small acute infarcts  scattered in the bilateral cerebellum (left greater than right). Punctate acute lacunar infarcts in both the left pons and thalamus. And minimal occipital lobe restricted diffusion, perhaps greater on the right. 2. No hemorrhagic transformation or intracranial mass effect. 3. Underlying chronic small vessel disease, including chronic thalamic microhemorrhages, and nonspecific ventricular enlargement.  Cardiac Studies:   Lexiscan (with Mod Bruce protocol) Nuclear stress test 07/05/2021: Nondiagnostic ECG stress. The heart rate response was consistent with Lexiscan. Occasional PACs and PVCs. There is a reversible moderate defect in the inferior and apical regions.  Overall LV systolic function is abnormal with global hypokinesis and inferior akinesis. Stress LV EF: 28%.  Compared to the report of study done on 09/04/2017, inferior wall defect noted as scar without ischemia, LVEF was mildly decreased at 50%.  High risk study due to marked decrease in LVEF.  Echocardiogram 10/02/2022: 1. Left ventricular ejection fraction, by estimation, is 60 to 65%. The left ventricle has normal function. The left ventricle has no regional wall motion abnormalities. Left ventricular diastolic parameters are indeterminate.  2. Right ventricular systolic function is mildly reduced.  The right ventricular size is normal. Tricuspid regurgitation signal is inadequate for assessing PA pressure. The estimated right ventricular systolic pressure is 37.8 mmHg.  3. Left atrial size was mild to moderately dilated.  4. The mitral valve is degenerative. Mild mitral valve regurgitation. No evidence of mitral stenosis.  5. The aortic valve is grossly normal. There is mild calcification of the aortic valve. There is mild thickening of the aortic valve. Aortic valve regurgitation is not visualized. No aortic stenosis is present.  6. The inferior vena cava is dilated in size with <50% respiratory variability, suggesting right atrial  pressure of 15 mmHg.    Loop recorder: Biotronic Biomonitor II loop 07/28/2021 for syncope    Remote loop recorder check 09/21/2022 Predominant rhythm is normal sinus rhythm. Atrial monitoring episodes = PACs, PVCs, brief AT/AF/SVT. No symptoms reported. No heart block.   In person loop check 10/03/2022: Patient AF burden less than 2%, no persistent atrial fibrillation.  Probably under sensing P waves as patient EKG correlation reveals atrial flutter on surface EKG.  EKG:  EKG 10/01/2022: Atypical atrial flutter with controlled ventricular response, nonspecific T abnormality.  Repeat EKG 10/03/2022, no change.  Assessment   Acute embolic stroke involving bilateral cerebellum left greater than right and left pons and thalamus. Persistent atypical atrial flutter Primary hypertension CAD SP CABG in 2008 Diabetes mellitus type 2 controlled with stage IIIb chronic kidney disease. History of GI bleed in January 2023 secondary to GERD and gastric and esophageal ulcers.  No recurrence.  CHA2DS2-VASc Score is 7.  Yearly risk of stroke: 10% (A, HTN, DM, Vasc Dz, Stroke).  Score of 1=0.6; 2=2.2; 3=3.2; 4=4.8; 5=7.2; 6=9.8; 7=>9.8) -(CHF; HTN; vasc disease DM,  Male = 1; Age <65 =0; 65-74 = 1,  >75 =2; stroke/embolism= 2).    Recommendations:   PINCHAS DECLEENE is a 88 y.o.  male with history of CAD s/p CABG  in 2008 with LIMA to LAD, sequential saphenous vein graft to first second and third obtuse marginals, and sequential saphenous vein graft to the RCA and right PDA. He has history of hypertension, hyperlipidemia, diabetes mellitus, GERD and esophageal and gastric ulcers diagnosed in Jan 2023 for dysphagia.  He has not had any bleeding diathesis and doing well.  Due to recurrent syncope underwent loop recorder plantation on 07/28/2021.  Patient admitted on 10/01/2022 with embolic infarct and now has left hemianopia and left upper extremity mild weakness and left facial droop and found to be in  atypical atrial flutter.  I reviewed his EKG and already started him on anticoagulation with heparin, will transition him over to rivaroxaban 15 mg in the evening in view of renal insufficiency.  With regard to atrial flutter, continue medical management, he is rate controlled.  We could consider cardioversion at least after 4 to 6 weeks of being on anticoagulation.  With regard to hypertension, blood pressure is well-controlled on present medical regimen.  Diabetes mellitus is uncontrolled, will leave it for primary team to address.  Could consider addition of either Sitagliptin or Farxiga 5 mg daily possible cardiac standpoint and diabetes standpoint as well.  With regard to GI bleed, no recurrence, will continue to monitor his hemoglobin closely.   Yates Decamp, MD, [facility] 10/04/2022, 7:27 AM Office: [phone]  1.

## 2022-10-04 NOTE — Progress Notes (Addendum)
STROKE TEAM PROGRESS NOTE   INTERVAL HISTORY  Patient sitting in chair, daughter at bedside.   Cardiology has ordered Xarelto for patient, positioning off heparin drip.  Patient is agreeable to this and understands importance of medication compliance.   Blood pressure adequately controlled, no PRN meds needed. Patient is alert and oriented with continued decreased left visual field.   Discussed that patient would need to not drive until his vision issues have resolved.  Daughter at bedside was made aware of this also. Possible CIR discharge planning.    Vitals:   10/04/22 0056 10/04/22 0300 10/04/22 0839 10/04/22 1141  BP: 137/72 131/67 (!) 147/69 (!) 141/80  Pulse: 76 75 77 75  Resp: 18 18 19 20   Temp: 98.2 F (36.8 C) 97.7 F (36.5 C) 98.1 F (36.7 C) 98 F (36.7 C)  TempSrc: Oral Oral Oral Oral  SpO2: 94% 99% 96% 97%  Weight:       CBC:  Recent Labs  Lab 10/01/22 2057 10/03/22 0815 10/04/22 0406  WBC 7.5 10.1 9.8  NEUTROABS 4.1  --   --   HGB 14.9 13.7 13.0  HCT 43.8 40.6 38.5*  MCV 95.2 94.9 93.9  PLT 180 161 154    Basic Metabolic Panel:  Recent Labs  Lab 10/01/22 2057 10/04/22 1327  NA 136 136  K 4.0 3.9  CL 99 99  CO2 28 22  GLUCOSE 169* 166*  BUN 28* 26*  CREATININE 1.39* 1.42*  CALCIUM 9.9 9.0    Lipid Panel:  Recent Labs  Lab 10/02/22 0227  CHOL 136  TRIG 68  HDL 45  CHOLHDL 3.0  VLDL 14  LDLCALC 77    HgbA1c:  Recent Labs  Lab 10/02/22 0227  HGBA1C 8.4*    Urine Drug Screen:  Recent Labs  Lab 10/01/22 2225  LABOPIA NONE DETECTED  COCAINSCRNUR NONE DETECTED  LABBENZ NONE DETECTED  AMPHETMU NONE DETECTED  THCU NONE DETECTED  LABBARB NONE DETECTED     Alcohol Level  Recent Labs  Lab 10/01/22 2057  ETH <10     IMAGING past 24 hours No results found.  PHYSICAL EXAM  Temp:  [97.7 F (36.5 C)-98.2 F (36.8 C)] 98 F (36.7 C) (06/18 1141) Pulse Rate:  [73-77] 75 (06/18 1141) Resp:  [16-20] 20 (06/18 1141) BP:  (131-167)/(67-80) 141/80 (06/18 1141) SpO2:  [94 %-99 %] 97 % (06/18 1141)  General - Well nourished, well developed, in no apparent distress. Cardiovascular - Regular rhythm and rate.  Mental Status -  Level of arousal and orientation to self, place, month, day of week. Not oriented to year (1974).  Language including expression, naming, repetition, comprehension was assessed and found intact. Attention span and concentration were normal. Recent and remote memory were decreased, does not remember coming into hospital.  Fund of Knowledge was assessed and was intact.  Cranial Nerves II - XII - II - Visual field left hemianopia III, IV, VI - Extraocular movements intact. V - L facial droop, slightly improved from yesterday VII - Facial movement intact bilaterally. VIII - Hearing & vestibular intact bilaterally. X - Palate elevates symmetrically. XI - Chin turning & shoulder shrug intact bilaterally. XII - Tongue protrusion intact.  Motor Strength - Trace LUE weakness, decreased grip, seems improved from yesterday.Bulk was normal and fasciculations were absent.   Motor Tone - Muscle tone was assessed at the neck and appendages and was normal  Sensory - Light touch assessed and symmetrical.    Coordination - No  ataxia, Decreased fine motor movements LUE.   Tremor was absent.  Gait and Station - deferred.  ASSESSMENT/PLAN [name] is a 88 y.o. male with history of CAD, hypertension, hyperlipidemia, s/p loop recorder, diabetes, anxiety and depression presenting with diplopia.   Stroke: Multiple scattered bilateral ischemic infarcts s/p thrombectomy of left P1/PCA TICI 3 Etiology: Likely cardioembolic Code Stroke  CT head No acute abnormality. ASPECTS 10.    CTA head & neck Short segment occlusion of the proximal left P1 segment, with additional moderate stenosis in the distal left P2 segment. Severe stenosis at the origin of the right PICA. Mild stenosis in the distal  basilar artery. CT perfusion no core infarct Cerebral angio Proximal occlusion of the left P1/PCA with clot protruding into the distal basilar artery. Mechanical thrombectomy performed with direct contact aspiration with complete recanalization after one pass (TICI3)  Post IR CT no hemorrhage MRI  Small acute infarcts scattered in the bilateral cerebellum (left greater than right). Punctate acute lacunar infarcts in both the left pons and thalamus. And minimal occipital lobe restricted diffusion, perhaps greater on the right. 2D Echo left ventricular ejection fraction 60 to 65%.  No wall motion abnormalities.  Left atrial size mild to moderately dilated. LDL 77 HgbA1c 8.4 VTE prophylaxis -SCDs    Diet   Diet Carb Modified Fluid consistency: Thin; Room service appropriate? Yes   Aspirin 81 mg prior to admission, now on on Aspirin, Plavix and Heparin gtt.  Transition Heparin to Xarelto, per cardiology assessment/recommendations.  Therapy recommendations: CIR, continue therapy and OOB to chair /ambulate as able. Disposition: Pending CIR eval and insurance  Hypertensive emergency Home meds: Acebutolol, olmesartan/hydrochlorothiazide UNStable Cleviprex off SBP goal 120-160 S/P thrombectomy for 24 hours, gradually normalize Long-term BP goal normotensive  Atrial Flutter Atrial Fibrillation Seen on loop recorder, paroxysmal episodes Pending check of previous 2 days Currently on Aspirin, Plavix and Heparin gtt, pending Cards recs and transition to PO med.   Hyperlipidemia Home meds: Atorvastatin 40, resumed in hospital LDL 77, goal < 70 Zetia 10mg  added Continue statin at discharge  Diabetes type II UnControlled Home meds: Metformin, Jardiance HgbA1c 8.4, goal < 7.0 CBGs Recent Labs    10/03/22 2058 10/04/22 0615 10/04/22 1144  GLUCAP 139* 156* 175*     SSI Will need close PCP follow-up for diabetes management  Other Stroke Risk Factors Advanced Age >/= 65  Coronary  artery disease  Other Active Problems ACute kidney injury-CR 1.39-continue IV fluids BPH restart home meds  Hospital day # 2   Pt seen by Neuro NP/APP and later by MD. Note/plan to be edited by MD as needed.    Lynnae January, DNP, AGACNP-BC [facility] Please use AMION for contact information & EPIC for messaging.  STROKE MD NOTE :  I have personally obtained history,examined this patient, reviewed notes, independently viewed imaging studies, participated in medical decision making and plan of care.ROS completed by me personally and pertinent positives fully documented  I have made any additions or clarifications directly to the above note. Agree with note above.  Patient continues to improve.  Recommend switching heparin to Xarelto for anticoagulation.  Continue ongoing physical occupational therapy and transfer to inpatient rehab when bed available.  Discussed with patient, daughter at the bedside and with Dr. Jacinto Halim cardiologist.  Greater than 50% time during this 35-minute visit was spent on counseling and coordination of care about his A-fib and embolic stroke and discussion about risk-benefit of anticoagulation and answering questions.  Delia Heady, MD Medical Director [facility] Pager: [phone] 10/04/2022 4:12 PM    To contact Stroke Continuity provider, please refer to [email or web address]. After hours, contact General Neurology

## 2022-10-04 NOTE — Progress Notes (Signed)
Physical Therapy Treatment Patient Details Name: [name] MRN: [id number] DOB: 1935-04-18 Today's Date: 10/04/2022   History of Present Illness 88 y.o. male admitted on 10/01/22 for double vision and hemianopsia.  LKW time was unknown, so no TNK was given, however, he did undergo IR thrombectomy of L P1 PCA occlusion shortly after admission. CTA revealed  P1 occlusion with stenosis of P2. MRI revealed small cute infracrts bil cerebellum (L>R) punctate acute lacunar infarcts in both left pons and thalamus, minimal occipital lobe restricted diffusion (R>L). PMH includes: ARF, anxiety, DM, HTN.    PT Comments    Pt with continued confusion, but overall participatory and demonstrates good activity tolerance throughout session. Pt requiring min assist (+2 safety) for transfers and limited hallway ambulation, with handheld assist provided for external support. Worked on standing dynamic balance and visual scanning task. Pt requiring significantly increased time and mod cueing for locating objects in left visual field. Education provided on deficits and encouraged continued compensatory techniques. He is an excellent candidate for intensive post acute rehabilitation in order to address deficits, maximize functional mobility and decrease caregiver burden.     Recommendations for follow up therapy are one component of a multi-disciplinary discharge planning process, led by the attending physician.  Recommendations may be updated based on patient status, additional functional criteria and insurance authorization.  Follow Up Recommendations       Assistance Recommended at Discharge Frequent or constant Supervision/Assistance  Patient can return home with the following Direct supervision/assist for medications management;Direct supervision/assist for financial management;Assist for transportation;Help with stairs or ramp for entrance;Assistance with cooking/housework;A little help with walking and/or  transfers;A little help with bathing/dressing/bathroom   Equipment Recommendations  Rolling walker (2 wheels)    Recommendations for Other Services Rehab consult     Precautions / Restrictions Precautions Precautions: Fall Precaution Comments: L hemianopsia Restrictions Weight Bearing Restrictions: No     Mobility  Bed Mobility               General bed mobility comments: OOB in chair    Transfers Overall transfer level: Needs assistance Equipment used: None Transfers: Sit to/from Stand Sit to Stand: Min assist           General transfer comment: Light minA to rise and steady    Ambulation/Gait Ambulation/Gait assistance: Min assist, +2 safety/equipment Gait Distance (Feet): 90 Feet Assistive device: 1 person hand held assist Gait Pattern/deviations: Decreased stride length, Narrow base of support, Step-through pattern Gait velocity: decreased     General Gait Details: Pt reaching for external support for increased stability, decreased ability to dual tasking requiring ~2 brief standing rest breaks. MinA for dynamic balance   Stairs             Wheelchair Mobility    Modified Rankin (Stroke Patients Only) Modified Rankin (Stroke Patients Only) Pre-Morbid Rankin Score: No symptoms Modified Rankin: Moderately severe disability     Balance Overall balance assessment: Needs assistance Sitting-balance support: No upper extremity supported, Feet supported Sitting balance-Leahy Scale: Fair     Standing balance support: No upper extremity supported, During functional activity Standing balance-Leahy Scale: Poor Standing balance comment: requiring min guard assist with no UE support                            Cognition Arousal/Alertness: Awake/alert Behavior During Therapy: Flat affect Overall Cognitive Status: Impaired/Different from baseline Area of Impairment: Awareness, Safety/judgement, Orientation, Attention, Memory, Following  commands, Problem solving                   Current Attention Level: Sustained Memory: Decreased recall of precautions, Decreased short-term memory Following Commands: Follows one step commands consistently, Follows one step commands with increased time, Follows multi-step commands inconsistently Safety/Judgement: Decreased awareness of safety, Decreased awareness of deficits Awareness: Intellectual Problem Solving: Slow processing, Decreased initiation, Difficulty sequencing, Requires verbal cues, Requires tactile cues General Comments: Decreased working and short term memory; poor awareness of deficits. Able to follow one step commands, but needs cues for sustaining attention        Exercises Other Exercises Other Exercises: Standing: dynamic balance task involving functional reaching and visual scanning with objects placed on table at waist height    General Comments        Pertinent Vitals/Pain Pain Assessment Pain Assessment: Faces Faces Pain Scale: Hurts a little bit Pain Location: headache Pain Descriptors / Indicators: Aching, Headache Pain Intervention(s): Monitored during session    Home Living                          Prior Function            PT Goals (current goals can now be found in the care plan section) Acute Rehab PT Goals Potential to Achieve Goals: Good Progress towards PT goals: Progressing toward goals    Frequency    Min 4X/week      PT Plan Current plan remains appropriate    Co-evaluation              AM-PAC PT "6 Clicks" Mobility   Outcome Measure  Help needed turning from your back to your side while in a flat bed without using bedrails?: A Little Help needed moving from lying on your back to sitting on the side of a flat bed without using bedrails?: A Little Help needed moving to and from a bed to a chair (including a wheelchair)?: A Little Help needed standing up from a chair using your arms (e.g.,  wheelchair or bedside chair)?: A Little Help needed to walk in hospital room?: A Little Help needed climbing 3-5 steps with a railing? : A Lot 6 Click Score: 17    End of Session Equipment Utilized During Treatment: Gait belt Activity Tolerance: Patient tolerated treatment well Patient left: in chair;with call bell/phone within reach;with chair alarm set Nurse Communication: Mobility status;Patient requests pain meds PT Visit Diagnosis: Muscle weakness (generalized) (M62.81);Difficulty in walking, not elsewhere classified (R26.2);Pain     Time: 1324-4010 PT Time Calculation (min) (ACUTE ONLY): 27 min  Charges:  $Therapeutic Activity: 23-37 mins                     [name], PT, DPT Acute Rehabilitation Services Office [phone]    [name] 10/04/2022, 10:06 AM

## 2022-10-04 NOTE — Progress Notes (Signed)
ANTICOAGULATION CONSULT NOTE  Pharmacy Consult for Heparin > Xarelto Indication: atrial fibrillation, also acute stroke  Allergies  Allergen Reactions   Ketorolac Tromethamine Other (See Comments)    nephrotic syndrome    Patient Measurements: Weight: 79.4 kg (175 lb) Heparin Dosing Weight: 79.4 kg  Vital Signs: Temp: 98 F (36.7 C) (06/18 1141) Temp Source: Oral (06/18 1141) BP: 141/80 (06/18 1141) Pulse Rate: 75 (06/18 1141)  Labs: Recent Labs    10/01/22 2057 10/01/22 2115 10/03/22 0815 10/03/22 1859 10/04/22 0406  HGB 14.9  --  13.7  --  13.0  HCT 43.8  --  40.6  --  38.5*  PLT 180  --  161  --  154  APTT  --  27  --   --   --   LABPROT  --  13.6  --   --   --   INR  --  1.0  --   --   --   HEPARINUNFRC  --   --  <0.10* 0.39 0.34  CREATININE 1.39*  --   --   --   --     Estimated Creatinine Clearance: 38.1 mL/min (A) (by C-G formula based on SCr of 1.39 mg/dL (H)).   Assessment: 88 y.o. M presents with acute stroke - likely cardioembolic. Pt remains in afib. Consulted by Dr. Jacinto Halim for low dose heparin for afib in stroke pt.   Heparin level therapeutic 0.34 this AM.  No issue with infusion nor bleeding per discussion with RN. Patient to transition to Xarelto this evening. Pending repeat BMP (previous renal function ~1).  Goal of Therapy:  Heparin level 0.3-0.5 units/ml Monitor platelets by anticoagulation protocol: Yes   Plan:  No bolus with CVA Stop heparin infusion when 1st dose Xarelto is given Start Xarelto 20mg  PO daily Continue to monitor H&H and platelets  Thank you for allowing pharmacy to be a part of this patient's care.  Thelma Barge, PharmD Clinical Pharmacist

## 2022-10-04 NOTE — Consult Note (Addendum)
Physical Medicine and Rehabilitation Consult Reason for Consult:weakness and balance deficits Referring Physician: Dr. Pearlean Brownie   HPI: [name] is a 88 y.o. male with history of CAD and hypertension status post loop recorder, and diabetes who presented on 616 with diplopia and difficulty with balance.  CTA of the head and neck revealed short segment occlusion of the proximal left P1 segment with additional moderate stenosis in the distal left P2 segment.  Severe stenosis at the origin of the right PI and mild stenosis in the distal basilar artery.  Patient underwent mechanical thrombectomy with complete recanalization.  MRI revealed scattered small acute infarcts in the bilateral cerebellum, as well as left pons and thalamus and minimal occipital lobe restricted diffusion right greater than left.  Patient was placed on Plavix and heparin drip.  Blood pressure was managed.  Patient demonstrating some confusion and lethargy but has begun to show signs of improvement.  Patient was up with physical therapy yesterday and was min to mod assist for ambulation and gait for 75 feet.  He demonstrated a narrow base of support and staggering on the right side with decreased gait velocity and safety awareness.  Needed max cues for visual scanning.  Was min assist for sit to stand was 2 transfers.  Review of Systems  Constitutional: Negative.   HENT: Negative.    Eyes:  Positive for blurred vision and double vision.  Respiratory:  Negative for cough.   Cardiovascular:  Negative for chest pain and palpitations.  Gastrointestinal:  Positive for nausea. Negative for blood in stool.  Genitourinary: Negative.   Musculoskeletal:  Negative for back pain and joint pain.  Skin: Negative.   Neurological:  Positive for dizziness, weakness and headaches. Negative for sensory change.  Psychiatric/Behavioral:  Negative for depression and suicidal ideas.    Past Medical History:  Diagnosis Date   Acute renal  failure (HCC)    secondary to nephrotic syndrome post cath, resolved with steroids   Anxiety    CAD (coronary artery disease) 04/2006   LIMA to LAD, sequential SVG to first, second, and third OM's, sequential SVG to mid RCA and PDA    Depression    Diabetes mellitus without complication [facility])    ED (erectile dysfunction)    HTN (hypertension)    Hx of adenomatous colonic polyps    Hyperlipidemia    Loop recorder: Biotronic Biomonitor II loop 07/28/2021 07/28/2021   Obesity    Past Surgical History:  Procedure Laterality Date   COLONOSCOPY W/ BIOPSIES AND POLYPECTOMY     CORONARY ARTERY BYPASS GRAFT  2008   VESSELS X6   LOOP RECORDER IMPLANT     RADIOLOGY WITH ANESTHESIA N/A 10/02/2022   Procedure: IR WITH ANESTHESIA;  Surgeon: Radiologist, Medication, MD;  Location: [facility] OR;  Service: Radiology;  Laterality: N/A;   UMBILICAL HERNIA REPAIR     Family History  Problem Relation Age of Onset   Heart attack Mother    Clotting disorder Father        died of renal disease   Heart disease Sister    Cancer Sister    Colon cancer Neg Hx    Social History:  reports that he has never smoked. He has never used smokeless tobacco. He reports that he does not drink alcohol and does not use drugs. Allergies:  Allergies  Allergen Reactions   Ketorolac Tromethamine Other (See Comments)    nephrotic syndrome   Medications Prior to Admission  Medication Sig  Dispense Refill   acebutolol (SECTRAL) 200 MG capsule Take 1 capsule by mouth twice daily 180 capsule 1   aspirin EC 81 MG tablet Take 81 mg by mouth daily.     atorvastatin (LIPITOR) 40 MG tablet Take 40 mg by mouth every evening.     furosemide (LASIX) 20 MG tablet Take 1 tablet by mouth daily as needed. Take in morning     JARDIANCE 25 MG TABS tablet Take 1 tablet by mouth every evening.  10   metFORMIN (GLUCOPHAGE) 1000 MG tablet 1 pill twice a day     MYRBETRIQ 50 MG TB24 tablet      olmesartan-hydrochlorothiazide (BENICAR HCT) 40-25  MG tablet TAKE 1 TABLET BY MOUTH ONCE DAILY IN THE MORNING 90 tablet 1   tadalafil (CIALIS) 5 MG tablet Take 5 mg by mouth daily.     tamsulosin (FLOMAX) 0.4 MG CAPS capsule Take 1 capsule by mouth daily after supper.      Home: Home Living Family/patient expects to be discharged to:: Private residence Living Arrangements: Alone Available Help at Discharge: Family, Available PRN/intermittently Type of Home: House Home Access: Stairs to enter Secretary/administrator of Steps: 2 Entrance Stairs-Rails: None Home Layout: Two level, Full bath on main level, Able to live on main level with bedroom/bathroom Bathroom Shower/Tub: Health visitor: Standard Home Equipment: None  Lives With: Alone  Functional History: Prior Function Prior Level of Function : Driving, Independent/Modified Independent Mobility Comments: does not use or own a cane or a walker, mildly unsteady at baseline.  Active with OP PT for his back. ADLs Comments: independent and driving Functional Status:  Mobility: Bed Mobility Overal bed mobility: Needs Assistance Bed Mobility: Supine to Sit Supine to sit: Mod assist, HOB elevated General bed mobility comments: OOB in chair Transfers Overall transfer level: Needs assistance Equipment used: None Transfers: Sit to/from Stand Sit to Stand: Min assist General transfer comment: Light minA to rise and steady Ambulation/Gait Ambulation/Gait assistance: Min assist, +2 safety/equipment Gait Distance (Feet): 90 Feet Assistive device: 1 person hand held assist Gait Pattern/deviations: Decreased stride length, Narrow base of support, Step-through pattern General Gait Details: Pt reaching for external support for increased stability, decreased ability to dual tasking requiring ~2 brief standing rest breaks. MinA for dynamic balance Gait velocity: decreased    ADL: ADL Overall ADL's : Needs assistance/impaired Grooming: Maximal assistance, Sitting Upper  Body Dressing : Maximal assistance, Sitting Lower Body Dressing: +2 for physical assistance, Maximal assistance, +2 for safety/equipment, Sit to/from stand Toilet Transfer: Minimal assistance, +2 for physical assistance, +2 for safety/equipment Functional mobility during ADLs: Minimal assistance, +2 for physical assistance, +2 for safety/equipment General ADL Comments: pt limited by L visual deficits and impaired balance, decreased cognitoin  Cognition: Cognition Overall Cognitive Status: Impaired/Different from baseline Arousal/Alertness: Awake/alert Orientation Level: Oriented to person, Oriented to place, Disoriented to time Year: 2024 Month: May (changed to March with repetition) Day of Week: Correct Attention: Sustained, Selective Sustained Attention: Impaired Sustained Attention Impairment: Verbal complex Memory: Impaired Memory Impairment:  (Immediate: 5/5 with repetition x2; delayed: 2/5 with cues: 2/3) Awareness: Impaired Awareness Impairment: Intellectual impairment Problem Solving: Impaired Problem Solving Impairment: Verbal complex (Money: 0/3; time: 0/2) Executive Function: Sequencing, Occupational hygienist: Impaired Sequencing Impairment: Verbal complex (clock: 0/4) Organizing: Impaired Organizing Impairment: Verbal complex (backward digit span: 1/2) Cognition Arousal/Alertness: Awake/alert Behavior During Therapy: Flat affect Overall Cognitive Status: Impaired/Different from baseline Area of Impairment: Awareness, Safety/judgement, Orientation, Attention, Memory, Following commands, Problem solving Orientation Level: Disoriented  to, Time Current Attention Level: Sustained Memory: Decreased recall of precautions, Decreased short-term memory Following Commands: Follows one step commands consistently, Follows one step commands with increased time, Follows multi-step commands inconsistently Safety/Judgement: Decreased awareness of safety, Decreased awareness of  deficits Awareness: Intellectual Problem Solving: Slow processing, Decreased initiation, Difficulty sequencing, Requires verbal cues, Requires tactile cues General Comments: Decreased working and short term memory; poor awareness of deficits. Able to follow one step commands, but needs cues for sustaining attention  Blood pressure (!) 141/80, pulse 75, temperature 98 F (36.7 C), temperature source Oral, resp. rate 20, weight 79.4 kg, SpO2 97 %. Physical Exam Constitutional:      Comments: Patient was slow to awaken initially but finally did.  In no distress.  HENT:     Head: Normocephalic.     Right Ear: External ear normal.     Left Ear: External ear normal.     Nose: Nose normal.  Eyes:     Extraocular Movements: Extraocular movements intact.     Pupils: Pupils are equal, round, and reactive to light.  Cardiovascular:     Rate and Rhythm: Normal rate and regular rhythm.  Pulmonary:     Effort: Pulmonary effort is normal.  Abdominal:     Palpations: Abdomen is soft.  Musculoskeletal:        General: Normal range of motion.     Cervical back: Normal range of motion.     Right lower leg: No edema.     Left lower leg: No edema.  Skin:    General: Skin is warm and dry.  Neurological:     Comments: Patient was lethargic initially but perked up nicely as we talked.  He is oriented to person and place as well as month and year.  Was able to provide me biographical cranial nerve exam was notable for mild left facial weakness and homonymous hemianopsia.  He seems to track to all visual fields.  He may have had some mild blurred vision diplopia but it was minimal today.  No gross ataxia appreciated.  Strength is grossly 4 to 4+ out of 5 in the left upper and lower extremities.  Right upper extremity and lower extremity were 4+ out of 5.  No focal differences in sensory exam are appreciated.  Deep tendon reflexes 2+ throughout.  No abnormal resting tone.     Results for orders placed or  performed during the hospital encounter of 10/01/22 (from the past 24 hour(s))  Glucose, capillary     Status: Abnormal   Collection Time: 10/03/22  4:27 PM  Result Value Ref Range   Glucose-Capillary 140 (H) 70 - 99 mg/dL  Heparin level (unfractionated)     Status: None   Collection Time: 10/03/22  6:59 PM  Result Value Ref Range   Heparin Unfractionated 0.39 0.30 - 0.70 IU/mL  Glucose, capillary     Status: Abnormal   Collection Time: 10/03/22  8:58 PM  Result Value Ref Range   Glucose-Capillary 139 (H) 70 - 99 mg/dL   Comment 1 Notify RN   Heparin level (unfractionated)     Status: None   Collection Time: 10/04/22  4:06 AM  Result Value Ref Range   Heparin Unfractionated 0.34 0.30 - 0.70 IU/mL  CBC     Status: Abnormal   Collection Time: 10/04/22  4:06 AM  Result Value Ref Range   WBC 9.8 4.0 - 10.5 K/uL   RBC 4.10 (L) 4.22 - 5.81 MIL/uL   Hemoglobin 13.0  13.0 - 17.0 g/dL   HCT 13.0 (L) 86.5 - 78.4 %   MCV 93.9 80.0 - 100.0 fL   MCH 31.7 26.0 - 34.0 pg   MCHC 33.8 30.0 - 36.0 g/dL   RDW 69.6 29.5 - 28.4 %   Platelets 154 150 - 400 K/uL   nRBC 0.0 0.0 - 0.2 %  Glucose, capillary     Status: Abnormal   Collection Time: 10/04/22  6:15 AM  Result Value Ref Range   Glucose-Capillary 156 (H) 70 - 99 mg/dL   Comment 1 Notify RN   Glucose, capillary     Status: Abnormal   Collection Time: 10/04/22 11:44 AM  Result Value Ref Range   Glucose-Capillary 175 (H) 70 - 99 mg/dL   Comment 1 Notify RN    Comment 2 Document in Chart    ECHOCARDIOGRAM COMPLETE  Result Date: 10/02/2022    ECHOCARDIOGRAM REPORT   Patient Name:   EMERALD CUNNANE Date of Exam: 10/02/2022 Medical Rec #:  [id number]        Height:       69.0 in Accession #:    [id number]       Weight:       175.0 lb Date of Birth:  Nov 22, 1934        BSA:          1.952 m Patient Age:    88 years         BP:           135/71 mmHg Patient Gender: M                HR:           74 bpm. Exam Location:  Inpatient Procedure: 2D Echo,  Cardiac Doppler, Color Doppler and Intracardiac            Opacification Agent Indications:    Stroke  History:        Patient has prior history of Echocardiogram examinations, most                 recent 11/18/2019.  Sonographer:    Sheralyn Boatman RDCS Referring Phys: [phone] MCNE[facility] P [facility]  Sonographer Comments: Technically difficult study due to poor echo windows. Image acquisition challenging due to patient body habitus. Study delayed for patient care. IMPRESSIONS  1. Left ventricular ejection fraction, by estimation, is 60 to 65%. The left ventricle has normal function. The left ventricle has no regional wall motion abnormalities. Left ventricular diastolic parameters are indeterminate.  2. Right ventricular systolic function is mildly reduced. The right ventricular size is normal. Tricuspid regurgitation signal is inadequate for assessing PA pressure. The estimated right ventricular systolic pressure is 37.8 mmHg.  3. Left atrial size was mild to moderately dilated.  4. The mitral valve is degenerative. Mild mitral valve regurgitation. No evidence of mitral stenosis.  5. The aortic valve is grossly normal. There is mild calcification of the aortic valve. There is mild thickening of the aortic valve. Aortic valve regurgitation is not visualized. No aortic stenosis is present.  6. The inferior vena cava is dilated in size with <50% respiratory variability, suggesting right atrial pressure of 15 mmHg. FINDINGS  Left Ventricle: Left ventricular ejection fraction, by estimation, is 60 to 65%. The left ventricle has normal function. The left ventricle has no regional wall motion abnormalities. Definity contrast agent was given IV to delineate the left ventricular  endocardial borders. The left ventricular internal cavity size was normal  in size. There is no left ventricular hypertrophy. Left ventricular diastolic parameters are indeterminate. Right Ventricle: The right ventricular size is normal. No increase in right  ventricular wall thickness. Right ventricular systolic function is mildly reduced. Tricuspid regurgitation signal is inadequate for assessing PA pressure. The tricuspid regurgitant velocity is 2.39 m/s, and with an assumed right atrial pressure of 15 mmHg, the estimated right ventricular systolic pressure is 37.8 mmHg. Left Atrium: Left atrial size was mild to moderately dilated. Right Atrium: Right atrial size was normal in size. Pericardium: There is no evidence of pericardial effusion. Presence of epicardial fat layer. Mitral Valve: The mitral valve is degenerative in appearance. There is mild calcification of the mitral valve leaflet(s). Mild mitral annular calcification. Mild mitral valve regurgitation. No evidence of mitral valve stenosis. Tricuspid Valve: The tricuspid valve is normal in structure. Tricuspid valve regurgitation is trivial. No evidence of tricuspid stenosis. Aortic Valve: The aortic valve is grossly normal. There is mild calcification of the aortic valve. There is mild thickening of the aortic valve. Aortic valve regurgitation is not visualized. No aortic stenosis is present. Pulmonic Valve: The pulmonic valve was normal in structure. Pulmonic valve regurgitation is not visualized. No evidence of pulmonic stenosis. Aorta: The aortic root is normal in size and structure. Venous: The inferior vena cava is dilated in size with less than 50% respiratory variability, suggesting right atrial pressure of 15 mmHg. IAS/Shunts: No atrial level shunt detected by color flow Doppler.  LEFT VENTRICLE PLAX 2D LVIDd:         4.00 cm   Diastology LVIDs:         2.40 cm   LV e' medial:    7.62 cm/s LV PW:         1.10 cm   LV E/e' medial:  16.0 LV IVS:        1.10 cm   LV e' lateral:   8.38 cm/s LVOT diam:     2.40 cm   LV E/e' lateral: 14.6 LV SV:         74 LV SV Index:   38 LVOT Area:     4.52 cm  RIGHT VENTRICLE            IVC RV S prime:     4.57 cm/s  IVC diam: 2.10 cm TAPSE (M-mode): 0.5 cm LEFT ATRIUM              Index        RIGHT ATRIUM           Index LA diam:        4.30 cm 2.20 cm/m   RA Area:     10.20 cm LA Vol (A2C):   38.7 ml 19.83 ml/m  RA Volume:   21.30 ml  10.91 ml/m LA Vol (A4C):   44.7 ml 22.90 ml/m LA Biplane Vol: 45.2 ml 23.16 ml/m  AORTIC VALVE LVOT Vmax:   81.70 cm/s LVOT Vmean:  53.100 cm/s LVOT VTI:    0.164 m  AORTA Ao Root diam: 3.00 cm Ao Asc diam:  3.50 cm MITRAL VALVE                TRICUSPID VALVE MV Area (PHT): 4.15 cm     TR Peak grad:   22.8 mmHg MV Decel Time: 183 msec     TR Vmax:        239.00 cm/s MV E velocity: 122.00 cm/s  SHUNTS                             Systemic VTI:  0.16 m                             Systemic Diam: 2.40 cm Weston Brass MD Electronically signed by Weston Brass MD Signature Date/Time: 10/02/2022/3:39:50 PM    Final     Assessment/Plan: Diagnosis: 88 year old male with bilateral ischemic infarcts status post thrombectomy.  Likely cardioembolic etiology. Does the need for close, 24 hr/day medical supervision in concert with the patient's rehab needs make it unreasonable for this patient to be served in a less intensive setting? Yes Co-Morbidities requiring supervision/potential complications:  -Hypertension -Paroxysmal atrial fibrillation -Uncontrolled atrial fibrillation -History of CAD -Acute kidney injury Due to bladder management, bowel management, safety, skin/wound care, disease management, medication administration, pain management, and patient education, does the patient require 24 hr/day rehab nursing? Yes Does the patient require coordinated care of a physician, rehab nurse, therapy disciplines of PT, OT, speech therapy to address physical and functional deficits in the context of the above medical diagnosis(es)? Yes Addressing deficits in the following areas: balance, endurance, locomotion, strength, transferring, bowel/bladder control, bathing, dressing, feeding, grooming, toileting, cognition,  and psychosocial support Can the patient actively participate in an intensive therapy program of at least 3 hrs of therapy per day at least 5 days per week? Yes The potential for patient to make measurable gains while on inpatient rehab is excellent Anticipated functional outcomes upon discharge from inpatient rehab are modified independent and supervision  with PT, modified independent and supervision with OT, modified independent and supervision with SLP. Estimated rehab length of stay to reach the above functional goals is: 8-12 days Anticipated discharge destination: Home Overall Rehab/Functional Prognosis: excellent  POST ACUTE RECOMMENDATIONS: This patient's condition is appropriate for continued rehabilitative care in the following setting: CIR Patient has agreed to participate in recommended program. Yes Note that insurance prior authorization may be required for reimbursement for recommended care.  Comment: Patient lives alone and can stay on the first floor.  He has numerous children and friends who can provide assistance once discharged. Rehab Admissions Coordinator to follow up.      I have personally performed a face to face diagnostic evaluation of this patient. Additionally, I have examined the patient's medical record including any pertinent labs and radiographic images. If the physician assistant has documented in this note, I have reviewed and edited or otherwise concur with the physician assistant's documentation.  Thanks,  Ranelle Oyster, MD 10/04/2022

## 2022-10-04 NOTE — Progress Notes (Signed)
Pt observed to be irritable and intermittently pulling off his gown and yelling out for help, when asked what he is calling for, pt seems alert, follows command and answers questions appropriately but still confused to why he is in the hospital. Pt re directed several times and repositioned in bed, will however continue to monitor, v/s stable. Obasogie-Asidi, Vonya Ohalloran Efe

## 2022-10-04 NOTE — Anesthesia Postprocedure Evaluation (Signed)
Anesthesia Post Note  Patient: SHAHRAM FREW  Procedure(s) Performed: IR WITH ANESTHESIA     Patient location during evaluation: PACU Anesthesia Type: General Level of consciousness: awake and alert Pain management: pain level controlled Vital Signs Assessment: post-procedure vital signs reviewed and stable Respiratory status: spontaneous breathing, nonlabored ventilation, respiratory function stable and patient connected to nasal cannula oxygen Cardiovascular status: blood pressure returned to baseline and stable Postop Assessment: no apparent nausea or vomiting Anesthetic complications: no   No notable events documented.  Last Vitals:  Vitals:   10/04/22 0056 10/04/22 0300  BP: 137/72 131/67  Pulse: 76 75  Resp: 18 18  Temp: 36.8 C 36.5 C  SpO2: 94% 99%    Last Pain:  Vitals:   10/04/22 0300  TempSrc: Oral  PainSc:                  Nelle Don Irineo Gaulin

## 2022-10-04 NOTE — Plan of Care (Signed)
  Problem: Education: Goal: Knowledge of disease or condition will improve Outcome: Progressing Goal: Knowledge of secondary prevention will improve (MUST DOCUMENT ALL) Outcome: Progressing Goal: Knowledge of patient specific risk factors will improve [name] N/A or DELETE if not current risk factor) Outcome: Progressing   Problem: Ischemic Stroke/TIA Tissue Perfusion: Goal: Complications of ischemic stroke/TIA will be minimized Outcome: Progressing   Problem: Coping: Goal: Will verbalize positive feelings about self Outcome: Progressing Goal: Will identify appropriate support needs Outcome: Progressing   Problem: Health Behavior/Discharge Planning: Goal: Ability to manage health-related needs will improve Outcome: Progressing Goal: Goals will be collaboratively established with patient/family Outcome: Progressing   Problem: Self-Care: Goal: Ability to participate in self-care as condition permits will improve Outcome: Progressing Goal: Verbalization of feelings and concerns over difficulty with self-care will improve Outcome: Progressing Goal: Ability to communicate needs accurately will improve Outcome: Progressing   Problem: Nutrition: Goal: Risk of aspiration will decrease Outcome: Progressing Goal: Dietary intake will improve Outcome: Progressing   Problem: Coping: Goal: Ability to adjust to condition or change in health will improve Outcome: Progressing   Problem: Education: Goal: Knowledge of General Education information will improve Description: Including pain rating scale, medication(s)/side effects and non-pharmacologic comfort measures Outcome: Progressing   Problem: Health Behavior/Discharge Planning: Goal: Ability to manage health-related needs will improve Outcome: Progressing

## 2022-10-05 ENCOUNTER — Other Ambulatory Visit (HOSPITAL_COMMUNITY): Payer: Self-pay

## 2022-10-05 ENCOUNTER — Ambulatory Visit: Payer: HMO | Admitting: Cardiology

## 2022-10-05 DIAGNOSIS — H5347 Heteronymous bilateral field defects: Secondary | ICD-10-CM | POA: Diagnosis not present

## 2022-10-05 LAB — GLUCOSE, CAPILLARY
Glucose-Capillary: 144 mg/dL — ABNORMAL HIGH (ref 70–99)
Glucose-Capillary: 133 mg/dL — ABNORMAL HIGH (ref 70–99)
Glucose-Capillary: 205 mg/dL — ABNORMAL HIGH (ref 70–99)
Glucose-Capillary: 159 mg/dL — ABNORMAL HIGH (ref 70–99)
Glucose-Capillary: 176 mg/dL — ABNORMAL HIGH (ref 70–99)
Glucose-Capillary: 158 mg/dL — ABNORMAL HIGH (ref 70–99)

## 2022-10-05 MED ORDER — INSULIN GLARGINE-YFGN 100 UNIT/ML ~~LOC~~ SOLN
5.0000 [IU] | Freq: Every day | SUBCUTANEOUS | Status: DC
  Administered 2022-10-05: 5 [IU] via SUBCUTANEOUS
  Filled 2022-10-05 (×2): qty 0.05

## 2022-10-05 MED ORDER — ACEBUTOLOL HCL 200 MG PO CAPS
200.0000 mg | ORAL_CAPSULE | Freq: Every day | ORAL | Status: DC
  Administered 2022-10-05 – 2022-10-06 (×2): 200 mg via ORAL
  Filled 2022-10-05 (×2): qty 1

## 2022-10-05 MED ORDER — APIXABAN 2.5 MG PO TABS
2.5000 mg | ORAL_TABLET | Freq: Two times a day (BID) | ORAL | Status: DC
  Administered 2022-10-05 – 2022-10-06 (×2): 2.5 mg via ORAL
  Filled 2022-10-05 (×2): qty 1

## 2022-10-05 NOTE — PMR Pre-admission (Signed)
PMR Admission Coordinator Pre-Admission Assessment  Patient: [name] is an 88 y.o., male MRN: [id number] DOB: April 13, 1935 Height: 5'9" Weight: 79.4 kg              Insurance Information HMO:  yes   PPO: PCP:      IPA:      80/20:      OTHER:  PRIMARY: [employer]      Policy#: [id number]      Subscriber: Pt CM Name: [name] at [phone]      Phone#: [phone]     Fax#: [phone] Pre-Cert#: [id number] for initial 7 days       Employer: Retired Benefits:  [phone]-option 1, spoke with Otilio Jefferson Date: 04/18/2022 - 04/18/2023 Deductible: no deductible ($0) OOP Max: $2,900 ($0.76 met) CIR: $200/day co-pay for days 1-5, $0/day days 6-90 SNF:  $0/day co-pay for days 1-20, $203/day co-pay for days 21-100; limited to 100 days/benefit period Outpatient: $0/visit co-pay; limited by medical necessity Home Health:  100% coverage DME: 80% coverage; 20% co-insurance Providers: in network   SECONDARY:       Policy#:       Phone#:   Artist:       Phone#:   The Data processing manager" for patients in Inpatient Rehabilitation Facilities with attached "Privacy Act Statement-Health Care Records" was provided and verbally reviewed with: Patient  Emergency Contact Information Contact Information     Name Relation Home Work Mobile   [place] Daughter   [phone]   Hall,Ingrid Other   [phone]      Current Medical History  Patient Admitting Diagnosis: B CVA   History of Present Illness:  An 88 year old right-handed male with history of diabetes mellitus, obesity with BMI 25.84.  Atrial flutter with history of loop recorder, hypertension, hyperlipidemia, CAD with CABG 2008 maintained on low-dose aspirin.  Per chart review patient lives alone.  Two-level home bed bath main level.  Independent prior to admission.  Presented 10/01/2022 with diplopia and difficulty with balance.  Admission chemistries unremarkable except BUN 28 creatinine  1.39, hemoglobin A1c 8.4, urine drug screen negative.  Cranial CT scan negative.  CTA/CT cerebral perfusion scan showed short segment occlusion of the proximal left P1 segment with additional moderate stenosis in the distal left P2 segment.  Severe stenosis left supraclinoid ICA and moderate stenosis of the right supraclinoid ICA.  Severe stenosis at the origin of the right PICA.  Patient underwent mechanical thrombectomy with complete recanalization per interventional radiology.  MRI follow-up revealed scattered small acute infarcts in the bilateral cerebellum as well as left pons and thalamus with minimal occipital lobe restricted diffusion right greater than left.  Echo with ejection fraction of 60 to 65% no wall motion abnormalities.  Neurology follow-up currently maintained on Eliquis as well as Plavix after discussed with neurology services as well as cardiology Dr. Jacinto Halim.  Tolerating a regular consistency diet.  Therapy evaluations completed due to patient decreased functional mobility was admitted for a comprehensive rehab program.   Complete NIHSS TOTAL: 2 Glasgow Coma Scale Score: 15  Patient's medical record from [facility]  has been reviewed by the rehabilitation admission coordinator and physician.  Past Medical History  Past Medical History:  Diagnosis Date   Acute renal failure (HCC)    secondary to nephrotic syndrome post cath, resolved with steroids   Anxiety    CAD (coronary artery disease) 04/2006   LIMA to LAD, sequential SVG to first, second, and third OM's, sequential SVG  to mid RCA and PDA    Depression    Diabetes mellitus without complication [facility])    ED (erectile dysfunction)    HTN (hypertension)    Hx of adenomatous colonic polyps    Hyperlipidemia    Loop recorder: Biotronic Biomonitor II loop 07/28/2021 07/28/2021   Obesity     Has the patient had major surgery during 100 days prior to admission? Yes  Family History  family history includes  Cancer in his sister; Clotting disorder in his father; Heart attack in his mother; Heart disease in his sister.   Current Medications   Current Facility-Administered Medications:    acebutolol (SECTRAL) capsule 200 mg, 200 mg, Oral, Daily, Marolyn Haller, MD, 200 mg at 10/06/22 1610   acetaminophen (TYLENOL) tablet 650 mg, 650 mg, Oral, Q4H PRN, 650 mg at 10/04/22 1011 **OR** acetaminophen (TYLENOL) 160 MG/5ML solution 650 mg, 650 mg, Per Tube, Q4H PRN **OR** acetaminophen (TYLENOL) suppository 650 mg, 650 mg, Rectal, Q4H PRN, [name], MD   apixaban Everlene Balls) tablet 2.5 mg, 2.5 mg, Oral, BID, Yates Decamp, MD, 2.5 mg at 10/06/22 9604   atorvastatin (LIPITOR) tablet 80 mg, 80 mg, Oral, Daily, Gevena Mart A, NP, 80 mg at 10/06/22 5409   [COMPLETED] clopidogrel (PLAVIX) tablet 300 mg, 300 mg, Oral, Once, 300 mg at 10/02/22 0459 **AND** clopidogrel (PLAVIX) tablet 75 mg, 75 mg, Oral, Daily, Yates Decamp, MD, 75 mg at 10/06/22 8119   ezetimibe (ZETIA) tablet 10 mg, 10 mg, Oral, q morning, Gevena Mart A, NP, 10 mg at 10/06/22 1478   hydrALAZINE (APRESOLINE) injection 5 mg, 5 mg, Intravenous, Q6H PRN, Richardo Priest, Erin C, NP   insulin aspart (novoLOG) injection 0-15 Units, 0-15 Units, Subcutaneous, TID WC, Hetty Blend C, NP, 3 Units at 10/06/22 1118   insulin aspart (novoLOG) injection 0-5 Units, 0-5 Units, Subcutaneous, QHS, Wolfe, Denise A, NP   insulin glargine-yfgn (SEMGLEE) injection 5 Units, 5 Units, Subcutaneous, QHS, Marolyn Haller, MD, 5 Units at 10/05/22 2152   melatonin tablet 3 mg, 3 mg, Oral, QHS PRN, [name], MD   mirabegron ER [facility]) tablet 50 mg, 50 mg, Oral, Daily, Gevena Mart A, NP, 50 mg at 10/06/22 2956   nitroGLYCERIN 200 mcg in sodium chloride (PF) 0.9 % 59.74 mL (25 mcg/mL) syringe, , Intra-arterial, PRN, de Melchor Amour, [place], MD, 200 mcg at 10/02/22 0020   ondansetron (ZOFRAN) injection 4 mg, 4 mg, Intravenous, Q6H PRN, Gevena Mart  A, NP, 4 mg at 10/03/22 2130   Oral care mouth rinse, 15 mL, Mouth Rinse, PRN, [name], MD   senna-docusate (Senokot-S) tablet 2 tablet, 2 tablet, Oral, QHS, Micki Riley, MD, 2 tablet at 10/05/22 2152   tadalafil (CIALIS) tablet 5 mg, 5 mg, Oral, Daily, Gevena Mart A, NP, 5 mg at 10/06/22 8657   tamsulosin (FLOMAX) capsule 0.4 mg, 0.4 mg, Oral, QPC supper, Gevena Mart A, NP, 0.4 mg at 10/05/22 1713   verapamil (ISOPTIN) injection, , , PRN, de Melchor Amour, [place], MD, 5 mg at 10/02/22 0018  Patients Current Diet:  Diet Order             Diet Carb Modified Fluid consistency: Thin; Room service appropriate? Yes  Diet effective now                   Precautions / Restrictions Precautions Precautions: Fall Precaution Comments: L hemianopsia Restrictions Weight Bearing Restrictions: No   Has the patient had 2 or more falls or  a fall with injury in the past year?No  Prior Activity Level Community (5-7x/wk): Pt. is active in the community PTA  Prior Functional Level Prior Function Prior Level of Function : Driving, Independent/Modified Independent Mobility Comments: does not use or own a cane or a walker, mildly unsteady at baseline.  Active with OP PT for his back. ADLs Comments: independent and driving  Self Care: Did the patient need help bathing, dressing, using the toilet or eating?  Independent  Indoor Mobility: Did the patient need assistance with walking from room to room (with or without device)? Independent  Stairs: Did the patient need assistance with internal or external stairs (with or without device)? Independent  Functional Cognition: Did the patient need help planning regular tasks such as shopping or remembering to take medications? Independent  Patient Information Are you of Hispanic, Latino/a,or Spanish origin?: A. No, not of Hispanic, Latino/a, or Spanish origin What is your race?: A. White Do you need or want an interpreter  to communicate with a doctor or health care staff?: 0. No  Patient's Response To:  Health Literacy and Transportation Is the patient able to respond to health literacy and transportation needs?: Yes Health Literacy - How often do you need to have someone help you when you read instructions, pamphlets, or other written material from your doctor or pharmacy?: Never In the past 12 months, has lack of transportation kept you from medical appointments or from getting medications?: No In the past 12 months, has lack of transportation kept you from meetings, work, or from getting things needed for daily living?: No  Home Assistive Devices / Equipment Home Equipment: None  Prior Device Use: Indicate devices/aids used by the patient prior to current illness, exacerbation or injury? None of the above  Current Functional Level Cognition  Arousal/Alertness: Awake/alert Overall Cognitive Status: Impaired/Different from baseline Current Attention Level: Sustained Orientation Level: Disoriented to time Following Commands: Follows one step commands consistently, Follows one step commands with increased time, Follows multi-step commands inconsistently Safety/Judgement: Decreased awareness of deficits, Decreased awareness of safety General Comments: pt continues to have decreased awareness of deficits, safety and decreased problem solving. He is easily distracted and required redirection. During visual and cognitive assessment, pt blaming the "bad" pen for increased difficulty with task. Completed clock drawing task (attempted 2x) with all numbers outside circle of clock and swayed to R, unable to recall time to set without assistance. Attention: Sustained, Selective Sustained Attention: Impaired Sustained Attention Impairment: Verbal complex Memory: Impaired Memory Impairment:  (Immediate: 5/5 with repetition x2; delayed: 2/5 with cues: 2/3) Awareness: Impaired Awareness Impairment: Intellectual  impairment Problem Solving: Impaired Problem Solving Impairment: Verbal complex (Money: 0/3; time: 0/2) Executive Function: Sequencing, Occupational hygienist: Impaired Sequencing Impairment: Verbal complex (clock: 0/4) Organizing: Impaired Organizing Impairment: Verbal complex (backward digit span: 1/2)    Extremity Assessment (includes Sensation/Coordination)  Upper Extremity Assessment: LUE deficits/detail LUE Deficits / Details: decreased sesnation and functional use, poor awareness of side but overall strength WFL poor coordination LUE Sensation: decreased light touch, decreased proprioception LUE Coordination: decreased fine motor, decreased gross motor  Lower Extremity Assessment: Defer to PT evaluation RLE Deficits / Details: right leg appeared mildly weaker than left leg per bed level gross assessment.  Sensation equal and intact bil to LT LLE Deficits / Details: stronger than R leg per bed level gross assessment.    ADLs  Overall ADL's : Needs assistance/impaired Grooming: Maximal assistance, Sitting Upper Body Dressing : Maximal assistance, Sitting Lower Body Dressing: +2  for physical assistance, Maximal assistance, +2 for safety/equipment, Sit to/from stand Toilet Transfer: Minimal assistance, +2 for physical assistance, +2 for safety/equipment Functional mobility during ADLs: Minimal assistance, +2 for physical assistance, +2 for safety/equipment General ADL Comments: focused on vision today    Mobility  Overal bed mobility: Needs Assistance Bed Mobility: Supine to Sit Supine to sit: Mod assist, HOB elevated General bed mobility comments: OOB in chair    Transfers  Overall transfer level: Needs assistance Equipment used: None Transfers: Sit to/from Stand Sit to Stand: Min assist General transfer comment: 1x person HHA to steady    Ambulation / Gait / Stairs / Psychologist, prison and probation services  Ambulation/Gait Ambulation/Gait assistance: Editor, commissioning (Feet): 80  Feet (3 bouts of 80 ft) Assistive device: 1 person hand held assist Gait Pattern/deviations: Decreased stride length, Narrow base of support, Step-through pattern, Trunk flexed, Shuffle General Gait Details: reaches for external support, increased forward trunk lean as he fatigues, shuffling gait pattern with fatigue as well, reports legs are feeling weak and tired Gait velocity: decreased Gait velocity interpretation: <1.8 ft/sec, indicate of risk for recurrent falls    Posture / Balance Dynamic Sitting Balance Sitting balance - Comments: limited dynamically Balance Overall balance assessment: Needs assistance Sitting-balance support: No upper extremity supported, Feet supported Sitting balance-Leahy Scale: Fair Sitting balance - Comments: limited dynamically Standing balance support: No upper extremity supported, During functional activity, Single extremity supported Standing balance-Leahy Scale: Poor Standing balance comment:  (relies on HHA and continues to reach out for external support) High level balance activites: Direction changes, Turns, Sudden stops, Head turns High Level Balance Comments: Pt able to increase and decrease gait speed with no LOB observed, can turn head multidirectional with slight decrease in speed and anterior trunk lean. Able to clear an obstacle on the ground with fair foot clearance and can navigate around an obstaclce with increased time. Navigated bilat turning with 3-5 steps and increased time, minor LOB observed. Min A required for small LOB.    Special needs/care consideration Special service needs visual deficits      Previous Home Environment (from acute therapy documentation) Living Arrangements: Alone  Lives With: Alone Available Help at Discharge: Family Type of Home: House Home Layout: Two level, Full bath on main level, Able to live on main level with bedroom/bathroom Home Access: Stairs to enter Entrance Stairs-Rails: None Entrance  Stairs-Number of Steps: 2 Bathroom Shower/Tub: Health visitor: Standard Bathroom Accessibility: Yes How Accessible: Accessible via walker, Accessible via wheelchair Home Care Services: No  Discharge Living Setting Plans for Discharge Living Setting: Patient's home Type of Home at Discharge: House Discharge Home Layout: Two level, Able to live on main level with bedroom/bathroom, Full bath on main level Alternate Level Stairs-Number of Steps: flight Discharge Home Access: Stairs to enter Entrance Stairs-Rails: None Entrance Stairs-Number of Steps: 2 Discharge Bathroom Shower/Tub: Walk-in shower Discharge Bathroom Toilet: Standard Discharge Bathroom Accessibility: Yes How Accessible: Accessible via walker, Accessible via wheelchair Does the patient have any problems obtaining your medications?: No  Social/Family/Support Systems Patient Roles: Other (Comment) Contact Information: [phone] Anticipated Caregiver: Verlon Au (daughter) Ability/Limitations of Caregiver: Family states that they will rotate to provide 24/7 assist Caregiver Availability: 24/7 Discharge Plan Discussed with Primary Caregiver: Yes Is Caregiver In Agreement with Plan?: Yes Does Caregiver/Family have Issues with Lodging/Transportation while Pt is in Rehab?: Yes   Goals Patient/Family Goal for Rehab: PT/OT/SLP mod I and supervision Expected length of stay: 8-12 days Pt/Family Agrees to Admission and willing  to participate: Yes Program Orientation Provided & Reviewed with Pt/Caregiver Including Roles  & Responsibilities: Yes   Decrease burden of Care through IP rehab admission: not anticipated   Possible need for SNF placement upon discharge: Not anticipated    Patient Condition: I have reviewed medical records from [facility] , spoken with CM, and patient and family member. I met with patient at the bedside for inpatient rehabilitation assessment.  Patient will benefit  from ongoing PT, OT, and SLP, can actively participate in 3 hours of therapy a day 5 days of the week, and can make measurable gains during the admission.  Patient will also benefit from the coordinated team approach during an Inpatient Acute Rehabilitation admission.  The patient will receive intensive therapy as well as Rehabilitation physician, nursing, social worker, and care management interventions.  Due to safety, skin/wound care, disease management, medication administration, pain management, and patient education the patient requires 24 hour a day rehabilitation nursing.  The patient is currently min to mod assist with mobility and basic ADLs.  Discharge setting and therapy post discharge at home with home health is anticipated.  Patient has agreed to participate in the Acute Inpatient Rehabilitation Program and will admit today.  Preadmission Screen Completed By:  Trish Mage, RN, 10/06/2022 11:35 AM ______________________________________________________________________   Discussed status with Dr. Riley Kill on 10/06/22 at 0930 and received approval for admission today.  Admission Coordinator:  Trish Mage, time11:33 am Dorna Bloom 10/06/22

## 2022-10-05 NOTE — Progress Notes (Signed)
Inpatient Rehab Admissions Coordinator:    I continue to await insurance approval for CIR. I spoke with daughter Verlon Au who stated that between family and paid caregivers, they can arrange 24/7 support at d/c. Benefits were reviewed with her.   Megan Salon, MS, CCC-SLP Rehab Admissions Coordinator  [phone] (celll) [phone] (office)

## 2022-10-05 NOTE — Care Management Important Message (Signed)
Important Message  Patient Details  Name: [name] MRN: [id number] Date of Birth: [birth year]   Medicare Important Message Given:  Yes     [name] 10/05/2022, 3:20 PM

## 2022-10-05 NOTE — Plan of Care (Signed)
  Problem: Education: Goal: Knowledge of disease or condition will improve Outcome: Progressing Goal: Knowledge of secondary prevention will improve (MUST DOCUMENT ALL) Outcome: Progressing Goal: Knowledge of patient specific risk factors will improve [name] N/A or DELETE if not current risk factor) Outcome: Progressing   Problem: Ischemic Stroke/TIA Tissue Perfusion: Goal: Complications of ischemic stroke/TIA will be minimized Outcome: Progressing   Problem: Coping: Goal: Will verbalize positive feelings about self Outcome: Progressing Goal: Will identify appropriate support needs Outcome: Progressing   Problem: Health Behavior/Discharge Planning: Goal: Ability to manage health-related needs will improve Outcome: Progressing Goal: Goals will be collaboratively established with patient/family Outcome: Progressing   Problem: Self-Care: Goal: Ability to participate in self-care as condition permits will improve Outcome: Progressing Goal: Verbalization of feelings and concerns over difficulty with self-care will improve Outcome: Progressing Goal: Ability to communicate needs accurately will improve Outcome: Progressing   Problem: Nutrition: Goal: Risk of aspiration will decrease Outcome: Progressing Goal: Dietary intake will improve Outcome: Progressing   Problem: Education: Goal: Understanding of CV disease, CV risk reduction, and recovery process will improve Outcome: Progressing Goal: Individualized Educational Video(s) Outcome: Progressing   Problem: Activity: Goal: Ability to return to baseline activity level will improve Outcome: Progressing   Problem: Cardiovascular: Goal: Ability to achieve and maintain adequate cardiovascular perfusion will improve Outcome: Progressing Goal: Vascular access site(s) Level 0-1 will be maintained Outcome: Progressing   Problem: Health Behavior/Discharge Planning: Goal: Ability to safely manage health-related needs after  discharge will improve Outcome: Progressing   Problem: Education: Goal: Ability to describe self-care measures that may prevent or decrease complications (Diabetes Survival Skills Education) will improve Outcome: Progressing Goal: Individualized Educational Video(s) Outcome: Progressing   Problem: Coping: Goal: Ability to adjust to condition or change in health will improve Outcome: Progressing   Problem: Fluid Volume: Goal: Ability to maintain a balanced intake and output will improve Outcome: Progressing   Problem: Health Behavior/Discharge Planning: Goal: Ability to identify and utilize available resources and services will improve Outcome: Progressing Goal: Ability to manage health-related needs will improve Outcome: Progressing   Problem: Metabolic: Goal: Ability to maintain appropriate glucose levels will improve Outcome: Progressing   Problem: Nutritional: Goal: Maintenance of adequate nutrition will improve Outcome: Progressing Goal: Progress toward achieving an optimal weight will improve Outcome: Progressing   Problem: Skin Integrity: Goal: Risk for impaired skin integrity will decrease Outcome: Progressing   Problem: Tissue Perfusion: Goal: Adequacy of tissue perfusion will improve Outcome: Progressing   Problem: Education: Goal: Knowledge of General Education information will improve Description: Including pain rating scale, medication(s)/side effects and non-pharmacologic comfort measures Outcome: Progressing   Problem: Health Behavior/Discharge Planning: Goal: Ability to manage health-related needs will improve Outcome: Progressing   Problem: Clinical Measurements: Goal: Ability to maintain clinical measurements within normal limits will improve Outcome: Progressing Goal: Will remain free from infection Outcome: Progressing Goal: Diagnostic test results will improve Outcome: Progressing Goal: Respiratory complications will improve Outcome:  Progressing Goal: Cardiovascular complication will be avoided Outcome: Progressing   Problem: Activity: Goal: Risk for activity intolerance will decrease Outcome: Progressing   Problem: Nutrition: Goal: Adequate nutrition will be maintained Outcome: Progressing   Problem: Coping: Goal: Level of anxiety will decrease Outcome: Progressing   Problem: Elimination: Goal: Will not experience complications related to bowel motility Outcome: Progressing Goal: Will not experience complications related to urinary retention Outcome: Progressing

## 2022-10-05 NOTE — TOC Benefit Eligibility Note (Signed)
Pharmacy Patient Advocate Encounter  Insurance verification completed.    The patient is insured through [employer]/ [employer]    Ran test claim for Xarelto and the current 30 day co-pay is $47.00.   This test claim was processed through [facility]- copay amounts may vary at other pharmacies due to pharmacy/plan contracts, or as the patient moves through the different stages of their insurance plan.

## 2022-10-05 NOTE — Progress Notes (Signed)
Subjective:  Patient feels much improved, states that his vision is also improved.  Intake/Output from previous day:  I/O last 3 completed shifts: In: -  Out: 800 [Urine:800] No intake/output data recorded. Net IO Since Admission: -1,022.27 mL [10/05/22 1321]  Blood pressure (!) 158/70, pulse 74, temperature 97.7 F (36.5 C), temperature source Oral, resp. rate 18, weight 79.4 kg, SpO2 96 %. Physical Exam Neck:     Vascular: No carotid bruit or JVD.  Cardiovascular:     Rate and Rhythm: Normal rate and regular rhythm. Occasional Extrasystoles are present.    Pulses: Intact distal pulses.     Heart sounds: Normal heart sounds. No murmur heard.    No gallop.  Pulmonary:     Effort: Pulmonary effort is normal.     Breath sounds: Normal breath sounds.  Abdominal:     General: Bowel sounds are normal.     Palpations: Abdomen is soft.  Musculoskeletal:     Right lower leg: Edema (Trace ankle) present.     Left lower leg: Edema (1-2+ ankle) present.     Lab Results: Lab Results  Component Value Date   NA 136 10/04/2022   K 3.9 10/04/2022   CO2 22 10/04/2022   GLUCOSE 166 (H) 10/04/2022   BUN 26 (H) 10/04/2022   CREATININE 1.42 (H) 10/04/2022   CALCIUM 9.0 10/04/2022   GFRNONAA 48 (L) 10/04/2022       Latest Ref Rng & Units 10/04/2022    1:27 PM 10/01/2022    8:57 PM 09/28/2021    7:10 PM  BMP  Glucose 70 - 99 mg/dL 130  865  784   BUN 8 - 23 mg/dL 26  28  28    Creatinine 0.61 - 1.24 mg/dL 6.96  2.95  2.84   Sodium 135 - 145 mmol/L 136  136  134   Potassium 3.5 - 5.1 mmol/L 3.9  4.0  4.1   Chloride 98 - 111 mmol/L 99  99  98   CO2 22 - 32 mmol/L 22  28  25    Calcium 8.9 - 10.3 mg/dL 9.0  9.9  13.2       Latest Ref Rng & Units 10/01/2022    8:57 PM 09/28/2021    7:10 PM 10/11/2009    8:52 AM  Hepatic Function  Total Protein 6.5 - 8.1 g/dL 7.7  8.1  6.5   Albumin 3.5 - 5.0 g/dL 4.3  4.5  3.4   AST 15 - 41 U/L 20  20  24    ALT 0 - 44 U/L 15  18  22    Alk  Phosphatase 38 - 126 U/L 64  61  48   Total Bilirubin 0.3 - 1.2 mg/dL 0.5  0.8  0.7       Latest Ref Rng & Units 10/04/2022    4:06 AM 10/03/2022    8:15 AM 10/01/2022    8:57 PM  CBC  WBC 4.0 - 10.5 K/uL 9.8  10.1  7.5   Hemoglobin 13.0 - 17.0 g/dL 44.0  10.2  72.5   Hematocrit 39.0 - 52.0 % 38.5  40.6  43.8   Platelets 150 - 400 K/uL 154  161  180    Lipid Panel     Component Value Date/Time   CHOL 136 10/02/2022 0227   TRIG 68 10/02/2022 0227   HDL 45 10/02/2022 0227   CHOLHDL 3.0 10/02/2022 0227   VLDL 14 10/02/2022 0227   LDLCALC 77 10/02/2022 0227  Cardiac Panel (last 3 results) No results for input(s): "CKTOTAL", "CKMB", "TROPONINIHS", "RELINDX" in the last 72 hours.  HEMOGLOBIN A1C Lab Results  Component Value Date   HGBA1C 8.4 (H) 10/02/2022   MPG 194.38 10/02/2022    Radiology:    Imaging results have been reviewed  Cardiac Studies:    Echocardiogram 10/02/2022: 1. Left ventricular ejection fraction, by estimation, is 60 to 65%. The left ventricle has normal function. The left ventricle has no regional wall motion abnormalities. Left ventricular diastolic parameters are indeterminate.  2. Right ventricular systolic function is mildly reduced. The right ventricular size is normal. Tricuspid regurgitation signal is inadequate for assessing PA pressure. The estimated right ventricular systolic pressure is 37.8 mmHg.  3. Left atrial size was mild to moderately dilated.  4. The mitral valve is degenerative. Mild mitral valve regurgitation. No evidence of mitral stenosis.  5. The aortic valve is grossly normal. There is mild calcification of the aortic valve. There is mild thickening of the aortic valve. Aortic valve regurgitation is not visualized. No aortic stenosis is present.  6. The inferior vena cava is dilated in size with <50% respiratory variability, suggesting right atrial pressure of 15 mmHg.  EKG: EKG 10/01/2022: Atypical atrial flutter with controlled  ventricular response, nonspecific T abnormality. Repeat EKG 10/03/2022, no change.   Telemetry: Paroxysmal atrial fibrillation 10/05/2022.   Scheduled Meds:  atorvastatin  80 mg Oral Daily   clopidogrel  75 mg Oral Daily   ezetimibe  10 mg Oral q morning   insulin aspart  0-15 Units Subcutaneous TID WC   insulin aspart  0-5 Units Subcutaneous QHS   mirabegron ER  50 mg Oral Daily   mupirocin ointment  1 Application Nasal BID   senna-docusate  2 tablet Oral QHS   tadalafil  5 mg Oral Daily   tamsulosin  0.4 mg Oral QPC supper   Continuous Infusions: PRN Meds:.acetaminophen **OR** acetaminophen (TYLENOL) oral liquid 160 mg/5 mL **OR** acetaminophen, hydrALAZINE, melatonin, nitroGLYCERIN 200 mcg in sodium chloride (PF) 0.9 % 59.74 mL (25 mcg/mL) syringe, ondansetron (ZOFRAN) IV, mouth rinse, verapamil  Assessment  [name] is a 88 y.o. male patient with acute embolic stroke related to paroxysmal episodes of atrial fibrillation and persistent atrial flutter.  1.  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  Primary hypertension 3.  Coronary artery disease SP CABG 2008 4.  Chronic renal insufficiency  Plan:   Discussed with neurology, prefer patient to be on Eliquis, will switch to 2.5 mg p.o. twice daily and serum creatinine is borderline.  Patient has extreme sensitivity to antiplatelet agents and also bleeds very easily, hence risks of bleed and nuisance bleed and history of GI bleed, patient has had severe GERD and gastritis in January 2023 requiring intervention due to GI bleeding.  Hence we will proceed with 2.5 mg p.o. twice daily.  Will discontinue Xarelto.  Will also discontinue aspirin.  From CAD standpoint he has remained stable without recurrence of angina pectoris or heart failure.  Discussed with neurology.  They are in agreement.  Patient's home medications have been discontinued including his butyl all  200 mg twice daily, will reinitiate this.  Patient was also on olmesartan HCT 40/25 mg daily, will reinitiate at 20/12.5 mg in the morning.  Will monitor his renal function.  He has tolerated ACE inhibitors/ARB's in the past.     Mike Decamp, MD, [facility] 10/05/2022, 1:21 PM Office: [phone] Fax: [phone]  Pager: [phone]

## 2022-10-05 NOTE — Progress Notes (Signed)
Occupational Therapy Treatment Patient Details Name: MATHANIEL VIVIRITO MRN: [id number] DOB: 1935-03-30 Today's Date: 10/05/2022   History of present illness 88 y.o. male admitted on 10/01/22 for double vision and hemianopsia.  LKW time was unknown, so no TNK was given, however, he did undergo IR thrombectomy of L P1 PCA occlusion shortly after admission. CTA revealed  P1 occlusion with stenosis of P2. MRI revealed small cute infracrts bil cerebellum (L>R) punctate acute lacunar infarcts in both left pons and thalamus, minimal occipital lobe restricted diffusion (R>L). PMH includes: ARF, anxiety, DM, HTN.   OT comments  Session focused on vision- patient with improved ability to scan to L side and noted decreased deficit on L side with peripheral vision testing. He remains limited by decreased problem solving, memory, and attention, and anticipate decreased ability to utilize skills functionally (unable to test today bc family arrived and pt wanted to visit).  Worked on visual scanning with line bisection, clock drawing and letter cancellation.  Discussed compensatory techniques.  Will follow acutely.    Recommendations for follow up therapy are one component of a multi-disciplinary discharge planning process, led by the attending physician.  Recommendations may be updated based on patient status, additional functional criteria and insurance authorization.    Assistance Recommended at Discharge Frequent or constant Supervision/Assistance  Patient can return home with the following  Assistance with cooking/housework;Direct supervision/assist for medications management;Direct supervision/assist for financial management;Assist for transportation;Help with stairs or ramp for entrance;Assistance with feeding;A lot of help with walking and/or transfers;A lot of help with bathing/dressing/bathroom   Equipment Recommendations  Other (comment) (defer)    Recommendations for Other Services      Precautions  / Restrictions Precautions Precautions: Fall Precaution Comments: L hemianopsia Restrictions Weight Bearing Restrictions: No       Mobility Bed Mobility               General bed mobility comments: OOB in chair    Transfers                         Balance                                           ADL either performed or assessed with clinical judgement   ADL                                         General ADL Comments: focused on vision today    Extremity/Trunk Assessment              Vision   Vision Assessment?: Yes Eye Alignment: Within Functional Limits Ocular Range of Motion: Within Functional Limits Alignment/Gaze Preference: Gaze right Tracking/Visual Pursuits: Impaired - to be further tested in functional context Visual Fields: Left visual field deficit Additional Comments: pt continues to deny diplopia.  L visual deficit but improved from eval, minus 30-45 degrees on L side only.  he is able to scan to L more consistently but with tracking tasks, pt jumps back to midline.  Unable to follow multiple step commands during quadrant testing, looks at # of fingers instead of keeping eyes at middle. Completed line bisection with 75% accuarcy, and letter cancellation with 100% accuracy.   Perception     Praxis  Cognition Arousal/Alertness: Awake/alert Behavior During Therapy: Flat affect Overall Cognitive Status: Impaired/Different from baseline Area of Impairment: Memory, Following commands, Safety/judgement, Awareness, Problem solving, Attention                   Current Attention Level: Sustained Memory: Decreased recall of precautions, Decreased short-term memory Following Commands: Follows one step commands consistently, Follows one step commands with increased time, Follows multi-step commands inconsistently Safety/Judgement: Decreased awareness of deficits, Decreased awareness of  safety Awareness: Intellectual Problem Solving: Slow processing, Decreased initiation, Difficulty sequencing, Requires verbal cues, Requires tactile cues General Comments: pt continues to have decreased awareness of deficits, safety and decreased problem solving. He is easily distracted and required redirection. During visual and cognitive assessment, pt blaming the "bad" pen for increased difficulty with task. Completed clock drawing task (attempted 2x) with all numbers outside circle of clock and swayed to R, unable to recall time to set without assistance.        Exercises      Shoulder Instructions       General Comments      Pertinent Vitals/ Pain       Pain Assessment Pain Assessment: No/denies pain  Home Living                                          Prior Functioning/Environment              Frequency  Min 2X/week        Progress Toward Goals  OT Goals(current goals can now be found in the care plan section)  Progress towards OT goals: Progressing toward goals  Acute Rehab OT Goals Patient Stated Goal: get better and then home OT Goal Formulation: With patient Time For Goal Achievement: 10/17/22 Potential to Achieve Goals: Fair  Plan Discharge plan remains appropriate;Frequency remains appropriate    Co-evaluation                 AM-PAC OT "6 Clicks" Daily Activity     Outcome Measure   Help from another person eating meals?: A Little Help from another person taking care of personal grooming?: A Little Help from another person toileting, which includes using toliet, bedpan, or urinal?: A Lot Help from another person bathing (including washing, rinsing, drying)?: A Lot Help from another person to put on and taking off regular upper body clothing?: A Lot Help from another person to put on and taking off regular lower body clothing?: A Lot 6 Click Score: 14    End of Session    OT Visit Diagnosis: Unsteadiness on feet  (R26.81);Hemiplegia and hemiparesis;Low vision, both eyes (H54.2);Other symptoms and signs involving cognitive function Hemiplegia - Right/Left: Left Hemiplegia - dominant/non-dominant: Non-Dominant Hemiplegia - caused by: Cerebral infarction   Activity Tolerance Patient tolerated treatment well   Patient Left in chair;with call bell/phone within reach;with chair alarm set;with family/visitor present   Nurse Communication Mobility status        Time: 9604-5409 OT Time Calculation (min): 23 min  Charges: OT General Charges $OT Visit: 1 Visit OT Treatments $Neuromuscular Re-education: 23-37 mins  Barry Brunner, OT Acute Rehabilitation Services Office [phone]   Chancy Milroy 10/05/2022, 1:25 PM

## 2022-10-05 NOTE — Discharge Instructions (Addendum)

## 2022-10-05 NOTE — Progress Notes (Signed)
Patient continue to have double/blurred vision making it difficult to fully perform the NIH d/t not being able to focus and read.

## 2022-10-05 NOTE — Progress Notes (Signed)
Physical Therapy Treatment Patient Details Name: [name] MRN: [id number] DOB: 12/21/1934 Today's Date: 10/05/2022   History of Present Illness 88 y.o. male admitted on 10/01/22 for double vision and hemianopsia.  LKW time was unknown, so no TNK was given, however, he did undergo IR thrombectomy of L P1 PCA occlusion shortly after admission. CTA revealed  P1 occlusion with stenosis of P2. MRI revealed small cute infracrts bil cerebellum (L>R) punctate acute lacunar infarcts in both left pons and thalamus, minimal occipital lobe restricted diffusion (R>L). PMH includes: ARF, anxiety, DM, HTN.    PT Comments    Pt continues to show good ability to demonstrate progress with functional mobility, activity tolerance, balance, and gait training. Today pt participates in higher level balance tasks with gait. Demonstrates need for external support with amb with impairments in head turning, bilateral turning, and obstacle navigation. Pt with three bouts of 80' and reports fatigue nearing the end of the session while presenting with increased forward trunk lean and shuffling steps. Pt with continued decrease in short term recall, unable to express what therapy worked on with him in the prior session, and with awareness of current deficits. PT suspect that pt will make great improvements with intensive inpatient follow up therapy, >3 hours/day. Pt will continue to benefit from skilled PT during their acute admission.    Recommendations for follow up therapy are one component of a multi-disciplinary discharge planning process, led by the attending physician.  Recommendations may be updated based on patient status, additional functional criteria and insurance authorization.  Follow Up Recommendations       Assistance Recommended at Discharge Frequent or constant Supervision/Assistance  Patient can return home with the following Direct supervision/assist for medications management;Direct  supervision/assist for financial management;Assist for transportation;Help with stairs or ramp for entrance;Assistance with cooking/housework;A little help with walking and/or transfers;A little help with bathing/dressing/bathroom   Equipment Recommendations  Rolling walker (2 wheels)    Recommendations for Other Services Rehab consult     Precautions / Restrictions Precautions Precautions: Fall Precaution Comments: L hemianopsia Restrictions Weight Bearing Restrictions: No     Mobility  Bed Mobility Overal bed mobility: Needs Assistance Bed Mobility: Supine to Sit     Supine to sit: Mod assist, HOB elevated    General Bed Mobility Comments: Requires verbal cueing for hand placement, min to modA to achieve upright seated positioning and needs therapist assist to scoot forward.      Transfers Overall transfer level: Needs assistance Equipment used: None Transfers: Sit to/from Stand Sit to Stand: Min assist           General transfer comment: 1x person HHA to steady    Ambulation/Gait Ambulation/Gait assistance: Min assist   Assistive device: 1 person hand held assist Gait Pattern/deviations: Decreased stride length, Narrow base of support, Step-through pattern, Trunk flexed, Shuffle Gait velocity: decreased Gait velocity interpretation: <1.8 ft/sec, indicate of risk for recurrent falls   General Gait Details: reaches for external support, increased forward trunk lean as he fatigues, shuffling gait pattern with fatigue as well, reports legs are feeling weak and tired   Stairs             Wheelchair Mobility    Modified Rankin (Stroke Patients Only) Modified Rankin (Stroke Patients Only) Pre-Morbid Rankin Score: No symptoms Modified Rankin: Moderately severe disability     Balance Overall balance assessment: Needs assistance Sitting-balance support: No upper extremity supported, Feet supported Sitting balance-Leahy Scale: Fair     Standing  balance support: No upper extremity supported, During functional activity, Single extremity supported Standing balance-Leahy Scale: Poor Standing balance comment:  (relies on HHA and continues to reach out for external support)             High level balance activites: Direction changes, Turns, Sudden stops, Head turns High Level Balance Comments: Pt able to increase and decrease gait speed with no LOB observed, can turn head multidirectional with slight decrease in speed and anterior trunk lean. Able to clear an obstacle on the ground with fair foot clearance and can navigate around an obstaclce with increased time. Navigated bilat turning with 3-5 steps and increased time, minor LOB observed. Min A required for small LOB.            Cognition Arousal/Alertness: Awake/alert Behavior During Therapy: Flat affect Overall Cognitive Status: Impaired/Different from baseline Area of Impairment: Memory, Following commands, Safety/judgement, Awareness, Problem solving, Attention, Orientation                 Orientation Level: Disoriented to, Time Current Attention Level: Sustained Memory: Decreased recall of precautions, Decreased short-term memory Following Commands: Follows one step commands consistently, Follows one step commands with increased time, Follows multi-step commands inconsistently Safety/Judgement: Decreased awareness of safety, Decreased awareness of deficits Awareness: Intellectual Problem Solving: Slow processing, Decreased initiation, Difficulty sequencing, Requires verbal cues, Requires tactile cues General Comments: Pt continues to have decreased awareness for his own deficits including balance and vision. Has some confusion about his current situation. Decreased short term recall with prior sessions, unable to express what we worked on yesterday even with mod cueing. Attention is heavily focused on telling stories and he is easily distractable.        Exercises       General Comments        Pertinent Vitals/Pain Pain Assessment Pain Assessment: No/denies pain    Home Living                          Prior Function            PT Goals (current goals can now be found in the care plan section) Acute Rehab PT Goals Patient Stated Goal: i need to get home PT Goal Formulation: With patient/family Time For Goal Achievement: 10/16/22 Potential to Achieve Goals: Good Progress towards PT goals: Progressing toward goals    Frequency    Min 4X/week      PT Plan Current plan remains appropriate    Co-evaluation              AM-PAC PT "6 Clicks" Mobility   Outcome Measure  Help needed turning from your back to your side while in a flat bed without using bedrails?: A Little Help needed moving from lying on your back to sitting on the side of a flat bed without using bedrails?: A Little Help needed moving to and from a bed to a chair (including a wheelchair)?: A Little Help needed standing up from a chair using your arms (e.g., wheelchair or bedside chair)?: A Little Help needed to walk in hospital room?: A Little Help needed climbing 3-5 steps with a railing? : A Lot 6 Click Score: 17    End of Session Equipment Utilized During Treatment: Gait belt Activity Tolerance: Patient tolerated treatment well Patient left: in chair;with call bell/phone within reach;with chair alarm set Nurse Communication: Mobility status PT Visit Diagnosis: Muscle weakness (generalized) (M62.81);Difficulty in walking, not elsewhere classified (  R26.2);Pain     Time: 0950-1010 PT Time Calculation (min) (ACUTE ONLY): 20 min  Charges:  $Therapeutic Activity: 8-22 mins                     Hendricks Milo, SPT  Acute Rehabilitation Services    Hendricks Milo 10/05/2022, 1:38 PM

## 2022-10-05 NOTE — Progress Notes (Signed)
SLP Cancellation Note  Patient Details Name: [name] MRN: [id number] DOB: [birth year]   Cancelled treatment:       Reason Eval/Treat Not Completed: Patient at procedure or test/unavailable (Pt ambulating in hallway with physical therapy. SLP wil follow up later a schedule allows.)  Lanyiah Brix I. Vear Clock, MS, CCC-SLP Neuro Diagnostic Specialist  Acute Rehabilitation Services Office number: [phone]   Scheryl Marten 10/05/2022, 12:52 PM

## 2022-10-05 NOTE — Progress Notes (Addendum)
STROKE TEAM PROGRESS NOTE   INTERVAL HISTORY  Patient sitting in chair, daughter and son in law at bedside.   Patient will be transitioned to eliquis today. Received 1x dose of xarelto yesterday. Blood pressure adequately controlled, no PRN meds needed. Patient is alert and oriented, [place] Continues to have vision issues.  Re-emphasized need to hold on driving until his vision deficits have resolved. Family at bedside made aware of this as well.  He is to go to [facility], pending insurance approval.    Vitals:   10/05/22 0048 10/05/22 0603 10/05/22 0816 10/05/22 1145  BP: 133/64 128/76 (!) 152/85 (!) 158/70  Pulse: 73 74 78 74  Resp: 18  18 18   Temp: 99.1 F (37.3 C) 98 F (36.7 C) (!) 97.5 F (36.4 C) 97.7 F (36.5 C)  TempSrc: Oral Oral Oral Oral  SpO2: 97% 95% 98% 96%  Weight:       CBC:  Recent Labs  Lab 10/01/22 2057 10/03/22 0815 10/04/22 0406  WBC 7.5 10.1 9.8  NEUTROABS 4.1  --   --   HGB 14.9 13.7 13.0  HCT 43.8 40.6 38.5*  MCV 95.2 94.9 93.9  PLT 180 161 154    Basic Metabolic Panel:  Recent Labs  Lab 10/01/22 2057 10/04/22 1327  NA 136 136  K 4.0 3.9  CL 99 99  CO2 28 22  GLUCOSE 169* 166*  BUN 28* 26*  CREATININE 1.39* 1.42*  CALCIUM 9.9 9.0    Lipid Panel:  Recent Labs  Lab 10/02/22 0227  CHOL 136  TRIG 68  HDL 45  CHOLHDL 3.0  VLDL 14  LDLCALC 77    HgbA1c:  Recent Labs  Lab 10/02/22 0227  HGBA1C 8.4*    Urine Drug Screen:  Recent Labs  Lab 10/01/22 2225  LABOPIA NONE DETECTED  COCAINSCRNUR NONE DETECTED  LABBENZ NONE DETECTED  AMPHETMU NONE DETECTED  THCU NONE DETECTED  LABBARB NONE DETECTED     Alcohol Level  Recent Labs  Lab 10/01/22 2057  ETH <10     IMAGING past 24 hours No results found.  PHYSICAL EXAM  Temp:  [97.5 F (36.4 C)-99.1 F (37.3 C)] 97.7 F (36.5 C) (06/19 1145) Pulse Rate:  [73-78] 74 (06/19 1145) Resp:  [17-18] 18 (06/19 1145) BP:  (121-158)/(56-97) 158/70 (06/19 1145) SpO2:  [95 %-98 %] 96 % (06/19 1145)   Constitutional: Well-developed, well-nourished, and in no distress.  Cardiovascular: Normal rate, intact distal pulses. No gallop and no friction rub.  No murmur heard. No lower extremity edema  Pulmonary: Non labored breathing on room air, no wheezing or rales  Abdominal: Soft. Normal bowel sounds. Non distended and non tender Skin: Skin is warm and dry.    Mental Status -  Alert and oriented to person, place, and time.  Language expression and comprehension remain intact.  Cranial Nerves II - XII - II - Visual field left hemianopia- ? Partial improvement III, IV, VI - Extraocular movements intact. V - L facial droop, stable  VII - Facial movement intact bilaterally. VIII - Hearing grossly intact  X - Palate elevates symmetrically.   Motor Strength - Trace LUE weakness, decreased grip strength stable from day prior exam   Gait and Station - deferred.  ASSESSMENT/PLAN [name] is a 88 y.o. male with history of CAD, hypertension, hyperlipidemia, s/p loop recorder, diabetes, anxiety and depression presenting with diplopia.   Stroke: Multiple scattered bilateral ischemic  infarcts s/p thrombectomy of left P1/PCA TICI 3 Etiology: Likely cardioembolic Code Stroke  CT head No acute abnormality. ASPECTS 10.    CTA head & neck Short segment occlusion of the proximal left P1 segment, with additional moderate stenosis in the distal left P2 segment. Severe stenosis at the origin of the right PICA. Mild stenosis in the distal basilar artery. CT perfusion no core infarct Cerebral angio Proximal occlusion of the left P1/PCA with clot protruding into the distal basilar artery. Mechanical thrombectomy performed with direct contact aspiration with complete recanalization after one pass (TICI3)  Post IR CT no hemorrhage MRI  Small acute infarcts scattered in the bilateral cerebellum (left greater than  right). Punctate acute lacunar infarcts in both the left pons and thalamus. And minimal occipital lobe restricted diffusion, perhaps greater on the right. 2D Echo left ventricular ejection fraction 60 to 65%.  No wall motion abnormalities.  Left atrial size mild to moderately dilated. LDL 77 HgbA1c 8.4 VTE prophylaxis -SCDs    Diet   Diet Carb Modified Fluid consistency: Thin; Room service appropriate? Yes   Aspirin 81 mg prior to admission, now on on Aspirin, Plavix and eliquis.    Therapy recommendations: CIR, continue therapy and OOB to chair /ambulate as able. Disposition: CIR pending insurance approval   Hypertensive emergency Home meds: Acebutolol, olmesartan/hydrochlorothiazide Stable  Cleviprex off SBP goal 120-160 S/P thrombectomy for 24 hours, gradually normalize Resume home olmesartan/hydrochlorothiazide in the AM if serum creatinine remains stable.   Atrial Flutter Atrial Fibrillation Seen on loop recorder, paroxysmal episodes Pending check of previous 2 days Aspirin, plavix, and eliquis   Hyperlipidemia Home meds: Atorvastatin 40, resumed in hospital LDL 77, goal < 70 Zetia 10mg  added this hospitalization  Continue statin and zetia at discharge  Diabetes type II UnControlled Home meds: Metformin, Jardiance HgbA1c 8.4, goal < 7.0 CBGs Recent Labs    10/05/22 0604 10/05/22 0814 10/05/22 1142  GLUCAP 144* 133* 205*     Will start low dose long acting insulin this PM, +SSI while inpatient  Will need close PCP follow-up for diabetes management  Other Stroke Risk Factors Advanced Age >/= 27  Coronary artery disease  Other Active Problems ACute kidney injury-CR 1.39-continue IV fluids BPH restart home meds  Hospital day # 3   Marolyn Haller, MD PGY-3 Internal Medicine Resident  Pager [phone] I have personally obtained history,examined this patient, reviewed notes, independently viewed imaging studies, participated in medical decision making and  plan of care.ROS completed by me personally and pertinent positives fully documented  I have made any additions or clarifications directly to the above note. Agree with note above.  Patient medically stable to be transferred to inpatient rehab when bed available.  Long discussion with the patient, daughter and son-in-law as well as Dr. Jacinto Halim about his atrial fibrillation and risk-benefit of anticoagulation and answered questions.  Greater than 50% time during this 35-minute visit was spent in counseling and coordination of care and discussion with patient and care team and answering questions  Delia Heady, MD Medical Director [facility] Pager: [phone] 10/05/2022 3:42 PM    To contact Stroke Continuity provider, please refer to [email or web address]. After hours, contact General Neurology

## 2022-10-05 NOTE — Plan of Care (Signed)
Problem: Education: Goal: Knowledge of disease or condition will improve Outcome: Progressing Goal: Knowledge of secondary prevention will improve (MUST DOCUMENT ALL) Outcome: Progressing Goal: Knowledge of patient specific risk factors will improve (Mark N/A or DELETE if not current risk factor) Outcome: Progressing   Problem: Ischemic Stroke/TIA Tissue Perfusion: Goal: Complications of ischemic stroke/TIA will be minimized Outcome: Progressing   Problem: Coping: Goal: Will verbalize positive feelings about self Outcome: Progressing Goal: Will identify appropriate support needs Outcome: Progressing   Problem: Health Behavior/Discharge Planning: Goal: Ability to manage health-related needs will improve Outcome: Progressing Goal: Goals will be collaboratively established with patient/family Outcome: Progressing   Problem: Self-Care: Goal: Ability to participate in self-care as condition permits will improve Outcome: Progressing Goal: Verbalization of feelings and concerns over difficulty with self-care will improve Outcome: Progressing Goal: Ability to communicate needs accurately will improve Outcome: Progressing   Problem: Nutrition: Goal: Risk of aspiration will decrease Outcome: Progressing Goal: Dietary intake will improve Outcome: Progressing   Problem: Education: Goal: Understanding of CV disease, CV risk reduction, and recovery process will improve Outcome: Progressing Goal: Individualized Educational Video(s) Outcome: Progressing   Problem: Activity: Goal: Ability to return to baseline activity level will improve Outcome: Progressing   Problem: Cardiovascular: Goal: Ability to achieve and maintain adequate cardiovascular perfusion will improve Outcome: Progressing Goal: Vascular access site(s) Level 0-1 will be maintained Outcome: Progressing   Problem: Health Behavior/Discharge Planning: Goal: Ability to safely manage health-related needs after  discharge will improve Outcome: Progressing   Problem: Education: Goal: Ability to describe self-care measures that may prevent or decrease complications (Diabetes Survival Skills Education) will improve Outcome: Progressing Goal: Individualized Educational Video(s) Outcome: Progressing   Problem: Coping: Goal: Ability to adjust to condition or change in health will improve Outcome: Progressing   Problem: Fluid Volume: Goal: Ability to maintain a balanced intake and output will improve Outcome: Progressing   Problem: Health Behavior/Discharge Planning: Goal: Ability to identify and utilize available resources and services will improve Outcome: Progressing Goal: Ability to manage health-related needs will improve Outcome: Progressing   Problem: Metabolic: Goal: Ability to maintain appropriate glucose levels will improve Outcome: Progressing   Problem: Nutritional: Goal: Maintenance of adequate nutrition will improve Outcome: Progressing Goal: Progress toward achieving an optimal weight will improve Outcome: Progressing   Problem: Skin Integrity: Goal: Risk for impaired skin integrity will decrease Outcome: Progressing   Problem: Tissue Perfusion: Goal: Adequacy of tissue perfusion will improve Outcome: Progressing   Problem: Education: Goal: Knowledge of General Education information will improve Description: Including pain rating scale, medication(s)/side effects and non-pharmacologic comfort measures Outcome: Progressing   Problem: Health Behavior/Discharge Planning: Goal: Ability to manage health-related needs will improve Outcome: Progressing   Problem: Clinical Measurements: Goal: Ability to maintain clinical measurements within normal limits will improve Outcome: Progressing Goal: Will remain free from infection Outcome: Progressing Goal: Diagnostic test results will improve Outcome: Progressing Goal: Respiratory complications will improve Outcome:  Progressing Goal: Cardiovascular complication will be avoided Outcome: Progressing   Problem: Activity: Goal: Risk for activity intolerance will decrease Outcome: Progressing   Problem: Nutrition: Goal: Adequate nutrition will be maintained Outcome: Progressing   Problem: Coping: Goal: Level of anxiety will decrease Outcome: Progressing   Problem: Elimination: Goal: Will not experience complications related to bowel motility Outcome: Progressing Goal: Will not experience complications related to urinary retention Outcome: Progressing   Problem: Pain Managment: Goal: General experience of comfort will improve Outcome: Progressing   Problem: Safety: Goal: Ability to remain free from injury   will improve Outcome: Progressing   Problem: Skin Integrity: Goal: Risk for impaired skin integrity will decrease Outcome: Progressing   

## 2022-10-06 ENCOUNTER — Other Ambulatory Visit (HOSPITAL_COMMUNITY): Payer: Self-pay

## 2022-10-06 ENCOUNTER — Inpatient Hospital Stay (HOSPITAL_COMMUNITY)
Admission: RE | Admit: 2022-10-06 | Discharge: 2022-10-12 | DRG: 057 | Disposition: A | Discharge status: Home / Self Care | Payer: HMO | Source: Intra-hospital | Attending: Physical Medicine and Rehabilitation | Admitting: Physical Medicine and Rehabilitation

## 2022-10-06 ENCOUNTER — Encounter (HOSPITAL_COMMUNITY): Payer: Self-pay | Admitting: Physical Medicine and Rehabilitation

## 2022-10-06 ENCOUNTER — Other Ambulatory Visit: Payer: Self-pay

## 2022-10-06 DIAGNOSIS — F419 Anxiety disorder, unspecified: Secondary | ICD-10-CM | POA: Diagnosis not present

## 2022-10-06 DIAGNOSIS — F32A Depression, unspecified: Secondary | ICD-10-CM | POA: Diagnosis present

## 2022-10-06 DIAGNOSIS — I69398 Other sequelae of cerebral infarction: Principal | ICD-10-CM

## 2022-10-06 DIAGNOSIS — J9811 Atelectasis: Secondary | ICD-10-CM | POA: Diagnosis not present

## 2022-10-06 DIAGNOSIS — Z809 Family history of malignant neoplasm, unspecified: Secondary | ICD-10-CM

## 2022-10-06 DIAGNOSIS — Z8249 Family history of ischemic heart disease and other diseases of the circulatory system: Secondary | ICD-10-CM | POA: Diagnosis not present

## 2022-10-06 DIAGNOSIS — D72829 Elevated white blood cell count, unspecified: Secondary | ICD-10-CM | POA: Diagnosis present

## 2022-10-06 DIAGNOSIS — K5900 Constipation, unspecified: Secondary | ICD-10-CM | POA: Diagnosis not present

## 2022-10-06 DIAGNOSIS — E785 Hyperlipidemia, unspecified: Secondary | ICD-10-CM | POA: Diagnosis present

## 2022-10-06 DIAGNOSIS — I4891 Unspecified atrial fibrillation: Secondary | ICD-10-CM | POA: Diagnosis present

## 2022-10-06 DIAGNOSIS — I4892 Unspecified atrial flutter: Secondary | ICD-10-CM | POA: Diagnosis present

## 2022-10-06 DIAGNOSIS — Z95828 Presence of other vascular implants and grafts: Secondary | ICD-10-CM | POA: Diagnosis not present

## 2022-10-06 DIAGNOSIS — H532 Diplopia: Secondary | ICD-10-CM | POA: Diagnosis not present

## 2022-10-06 DIAGNOSIS — N179 Acute kidney failure, unspecified: Secondary | ICD-10-CM | POA: Diagnosis present

## 2022-10-06 DIAGNOSIS — Z7901 Long term (current) use of anticoagulants: Secondary | ICD-10-CM

## 2022-10-06 DIAGNOSIS — I639 Cerebral infarction, unspecified: Principal | ICD-10-CM | POA: Diagnosis present

## 2022-10-06 DIAGNOSIS — Z832 Family history of diseases of the blood and blood-forming organs and certain disorders involving the immune mechanism: Secondary | ICD-10-CM

## 2022-10-06 DIAGNOSIS — Z951 Presence of aortocoronary bypass graft: Secondary | ICD-10-CM | POA: Diagnosis not present

## 2022-10-06 DIAGNOSIS — L600 Ingrowing nail: Secondary | ICD-10-CM | POA: Diagnosis present

## 2022-10-06 DIAGNOSIS — N400 Enlarged prostate without lower urinary tract symptoms: Secondary | ICD-10-CM | POA: Diagnosis present

## 2022-10-06 DIAGNOSIS — Z79899 Other long term (current) drug therapy: Secondary | ICD-10-CM

## 2022-10-06 DIAGNOSIS — Z888 Allergy status to other drugs, medicaments and biological substances status: Secondary | ICD-10-CM

## 2022-10-06 DIAGNOSIS — I2510 Atherosclerotic heart disease of native coronary artery without angina pectoris: Secondary | ICD-10-CM | POA: Diagnosis not present

## 2022-10-06 DIAGNOSIS — G4700 Insomnia, unspecified: Secondary | ICD-10-CM | POA: Diagnosis not present

## 2022-10-06 DIAGNOSIS — I63332 Cerebral infarction due to thrombosis of left posterior cerebral artery: Secondary | ICD-10-CM

## 2022-10-06 DIAGNOSIS — I10 Essential (primary) hypertension: Secondary | ICD-10-CM | POA: Diagnosis not present

## 2022-10-06 DIAGNOSIS — Z7902 Long term (current) use of antithrombotics/antiplatelets: Secondary | ICD-10-CM

## 2022-10-06 DIAGNOSIS — Z7984 Long term (current) use of oral hypoglycemic drugs: Secondary | ICD-10-CM

## 2022-10-06 DIAGNOSIS — E119 Type 2 diabetes mellitus without complications: Secondary | ICD-10-CM | POA: Diagnosis present

## 2022-10-06 DIAGNOSIS — Z7982 Long term (current) use of aspirin: Secondary | ICD-10-CM

## 2022-10-06 LAB — GLUCOSE, CAPILLARY
Glucose-Capillary: 127 mg/dL — ABNORMAL HIGH (ref 70–99)
Glucose-Capillary: 181 mg/dL — ABNORMAL HIGH (ref 70–99)
Glucose-Capillary: 164 mg/dL — ABNORMAL HIGH (ref 70–99)
Glucose-Capillary: 200 mg/dL — ABNORMAL HIGH (ref 70–99)

## 2022-10-06 LAB — BASIC METABOLIC PANEL
Anion gap: 8 (ref 5–15)
BUN: 23 mg/dL (ref 8–23)
CO2: 26 mmol/L (ref 22–32)
Calcium: 9.2 mg/dL (ref 8.9–10.3)
Chloride: 103 mmol/L (ref 98–111)
Creatinine, Ser: 1.25 mg/dL — ABNORMAL HIGH (ref 0.61–1.24)
GFR, Estimated: 56 mL/min — ABNORMAL LOW (ref >60)
Glucose, Bld: 134 mg/dL — ABNORMAL HIGH (ref 70–99)
Potassium: 3.5 mmol/L (ref 3.5–5.1)
Sodium: 137 mmol/L (ref 135–145)

## 2022-10-06 MED ORDER — ATORVASTATIN CALCIUM 80 MG PO TABS
80.0000 mg | ORAL_TABLET | Freq: Every day | ORAL | Status: DC
  Administered 2022-10-07 – 2022-10-12 (×6): 80 mg via ORAL
  Filled 2022-10-06 (×6): qty 1

## 2022-10-06 MED ORDER — APIXABAN 2.5 MG PO TABS
2.5000 mg | ORAL_TABLET | Freq: Two times a day (BID) | ORAL | Status: DC
  Administered 2022-10-06 – 2022-10-12 (×12): 2.5 mg via ORAL
  Filled 2022-10-06 (×12): qty 1

## 2022-10-06 MED ORDER — INSULIN ASPART 100 UNIT/ML IJ SOLN
0.0000 [IU] | Freq: Three times a day (TID) | INTRAMUSCULAR | Status: DC
  Administered 2022-10-06: 3 [IU] via SUBCUTANEOUS
  Administered 2022-10-07: 2 [IU] via SUBCUTANEOUS
  Administered 2022-10-07: 5 [IU] via SUBCUTANEOUS
  Administered 2022-10-08: 2 [IU] via SUBCUTANEOUS
  Administered 2022-10-08: 5 [IU] via SUBCUTANEOUS
  Administered 2022-10-09: 3 [IU] via SUBCUTANEOUS
  Administered 2022-10-09: 5 [IU] via SUBCUTANEOUS
  Administered 2022-10-09: 2 [IU] via SUBCUTANEOUS
  Administered 2022-10-10: 3 [IU] via SUBCUTANEOUS
  Administered 2022-10-10: 5 [IU] via SUBCUTANEOUS
  Administered 2022-10-10 – 2022-10-11 (×2): 3 [IU] via SUBCUTANEOUS
  Administered 2022-10-11: 2 [IU] via SUBCUTANEOUS
  Administered 2022-10-11: 3 [IU] via SUBCUTANEOUS

## 2022-10-06 MED ORDER — SENNOSIDES-DOCUSATE SODIUM 8.6-50 MG PO TABS
2.0000 | ORAL_TABLET | Freq: Every day | ORAL | Status: DC
  Administered 2022-10-07 – 2022-10-08 (×2): 2 via ORAL
  Filled 2022-10-06 (×2): qty 2

## 2022-10-06 MED ORDER — ACEBUTOLOL HCL 200 MG PO CAPS
200.0000 mg | ORAL_CAPSULE | Freq: Every day | ORAL | Status: DC
  Administered 2022-10-07 – 2022-10-12 (×6): 200 mg via ORAL
  Filled 2022-10-06 (×6): qty 1

## 2022-10-06 MED ORDER — MELATONIN 3 MG PO TABS: 3.0000 mg | ORAL_TABLET | Freq: Every evening | ORAL | Status: DC | PRN

## 2022-10-06 MED ORDER — INSULIN ASPART 100 UNIT/ML IJ SOLN
0.0000 [IU] | Freq: Three times a day (TID) | INTRAMUSCULAR | 11 refills | Status: DC
  Filled 2022-10-06: qty 10, 22d supply, fill #0

## 2022-10-06 MED ORDER — TAMSULOSIN HCL 0.4 MG PO CAPS
0.4000 mg | ORAL_CAPSULE | Freq: Every day | ORAL | Status: DC
  Administered 2022-10-06 – 2022-10-11 (×6): 0.4 mg via ORAL
  Filled 2022-10-06 (×6): qty 1

## 2022-10-06 MED ORDER — ACETAMINOPHEN 160 MG/5ML PO SOLN: 650.0000 mg | ORAL | Status: DC | PRN

## 2022-10-06 MED ORDER — ACETAMINOPHEN 650 MG RE SUPP: 650.0000 mg | RECTAL | Status: DC | PRN

## 2022-10-06 MED ORDER — MIRABEGRON ER 50 MG PO TB24: 50.0000 mg | ORAL_TABLET | Freq: Every day | ORAL | Status: DC

## 2022-10-06 MED ORDER — ACETAMINOPHEN 325 MG PO TABS: 650.0000 mg | ORAL_TABLET | ORAL | Status: DC | PRN

## 2022-10-06 MED ORDER — ATORVASTATIN CALCIUM 80 MG PO TABS: 80.0000 mg | ORAL_TABLET | Freq: Every day | ORAL | Status: DC

## 2022-10-06 MED ORDER — INSULIN GLARGINE-YFGN 100 UNIT/ML ~~LOC~~ SOLN
5.0000 [IU] | Freq: Every day | SUBCUTANEOUS | Status: DC
  Administered 2022-10-06: 5 [IU] via SUBCUTANEOUS
  Filled 2022-10-06 (×2): qty 0.05

## 2022-10-06 MED ORDER — MIRABEGRON ER 50 MG PO TB24
50.0000 mg | ORAL_TABLET | Freq: Every day | ORAL | Status: DC
  Administered 2022-10-07 – 2022-10-12 (×6): 50 mg via ORAL
  Filled 2022-10-06 (×6): qty 1

## 2022-10-06 MED ORDER — ZETIA 10 MG PO TABS: 10.0000 mg | ORAL_TABLET | Freq: Every morning | ORAL | Status: DC

## 2022-10-06 MED ORDER — INSULIN GLARGINE-YFGN 100 UNIT/ML ~~LOC~~ SOPN
5.0000 [IU] | PEN_INJECTOR | Freq: Every day | SUBCUTANEOUS | 11 refills | Status: DC
  Filled 2022-10-06: qty 6, 120d supply, fill #0

## 2022-10-06 MED ORDER — CLOPIDOGREL BISULFATE 75 MG PO TABS
75.0000 mg | ORAL_TABLET | Freq: Every day | ORAL | Status: DC
  Administered 2022-10-07 – 2022-10-12 (×6): 75 mg via ORAL
  Filled 2022-10-06 (×6): qty 1

## 2022-10-06 MED ORDER — INSULIN ASPART 100 UNIT/ML IJ SOLN
0.0000 [IU] | Freq: Every day | INTRAMUSCULAR | 11 refills | Status: DC
  Filled 2022-10-06: qty 10, 120d supply, fill #0

## 2022-10-06 MED ORDER — TADALAFIL 5 MG PO TABS
5.0000 mg | ORAL_TABLET | Freq: Every day | ORAL | Status: DC
  Administered 2022-10-07 – 2022-10-12 (×6): 5 mg via ORAL
  Filled 2022-10-06 (×6): qty 1

## 2022-10-06 MED ORDER — EZETIMIBE 10 MG PO TABS
10.0000 mg | ORAL_TABLET | Freq: Every morning | ORAL | Status: DC
  Administered 2022-10-07 – 2022-10-12 (×6): 10 mg via ORAL
  Filled 2022-10-06 (×6): qty 1

## 2022-10-06 MED ORDER — APIXABAN 2.5 MG PO TABS: 2.5000 mg | ORAL_TABLET | Freq: Two times a day (BID) | ORAL | Status: DC

## 2022-10-06 NOTE — H&P (Addendum)
Physical Medicine and Rehabilitation Admission H&P        Chief Complaint  Patient presents with   Diplopia   Code Stroke  : HPI: [name] is an 88 year old right-handed male with history of diabetes mellitus, obesity with BMI 25.84.  Atrial flutter with history of loop recorder, hypertension, hyperlipidemia, CAD with CABG 2008 maintained on low-dose aspirin.  Per chart review patient lives alone.  Two-level home bed bath main level.  Independent prior to admission.  Presented 10/01/2022 with diplopia and difficulty with balance.  Admission chemistries unremarkable except BUN 28 creatinine 1.39, hemoglobin A1c 8.4, urine drug screen negative.  Cranial CT scan negative.  CTA/CT cerebral perfusion scan showed short segment occlusion of the proximal left P1 segment with additional moderate stenosis in the distal left P2 segment.  Severe stenosis left supraclinoid ICA and moderate stenosis of the right supraclinoid ICA.  Severe stenosis at the origin of the right PICA.  Patient underwent mechanical thrombectomy with complete recanalization per interventional radiology.  MRI follow-up revealed scattered small acute infarcts in the bilateral cerebellum as well as left pons and thalamus with minimal occipital lobe restricted diffusion right greater than left.  Echo with ejection fraction of 60 to 65% no wall motion abnormalities.  Neurology follow-up currently maintained on Eliquis as well as Plavix after discussed with neurology services as well as cardiology Dr. Jacinto Halim.  Tolerating a regular consistency diet.  Therapy evaluations completed due to patient decreased functional mobility was admitted for a comprehensive rehab program.   Review of Systems  Constitutional:  Negative for chills and fever.  HENT:  Negative for hearing loss.   Eyes:  Positive for blurred vision and double vision.  Respiratory:  Negative for cough, shortness of breath and wheezing.   Cardiovascular:  Positive for  palpitations. Negative for chest pain.  Gastrointestinal:  Positive for constipation. Negative for heartburn, nausea and vomiting.  Genitourinary:  Positive for urgency. Negative for dysuria, flank pain and hematuria.  Musculoskeletal:  Positive for joint pain and myalgias.  Skin:  Negative for rash.  Neurological:  Positive for dizziness and headaches.  Psychiatric/Behavioral:  Positive for depression. The patient has insomnia.        Anxiety  All other systems reviewed and are negative.       Past Medical History:  Diagnosis Date   Acute renal failure (HCC)      secondary to nephrotic syndrome post cath, resolved with steroids   Anxiety     CAD (coronary artery disease) 04/2006    LIMA to LAD, sequential SVG to first, second, and third OM's, sequential SVG to mid RCA and PDA    Depression     Diabetes mellitus without complication [facility])     ED (erectile dysfunction)     HTN (hypertension)     Hx of adenomatous colonic polyps     Hyperlipidemia     Loop recorder: Biotronic Biomonitor II loop 07/28/2021 07/28/2021   Obesity           Past Surgical History:  Procedure Laterality Date   COLONOSCOPY W/ BIOPSIES AND POLYPECTOMY       CORONARY ARTERY BYPASS GRAFT   2008    VESSELS X6   IR CT HEAD LTD   10/02/2022   IR PERCUTANEOUS ART THROMBECTOMY/INFUSION INTRACRANIAL INC DIAG ANGIO   10/02/2022   IR US GUIDE VASC ACCESS RIGHT   10/02/2022   LOOP RECORDER IMPLANT       RADIOLOGY WITH  ANESTHESIA N/A 10/02/2022    Procedure: IR WITH ANESTHESIA;  Surgeon: Radiologist, Medication, MD;  Location: [facility] OR;  Service: Radiology;  Laterality: N/A;   UMBILICAL HERNIA REPAIR             Family History  Problem Relation Age of Onset   Heart attack Mother     Clotting disorder Father          died of renal disease   Heart disease Sister     Cancer Sister     Colon cancer Neg Hx      Social History:  reports that he has never smoked. He has never used smokeless tobacco. He reports that he  does not drink alcohol and does not use drugs. Allergies:       Allergies  Allergen Reactions   Ketorolac Tromethamine Other (See Comments)      nephrotic syndrome          Medications Prior to Admission  Medication Sig Dispense Refill   acebutolol (SECTRAL) 200 MG capsule Take 1 capsule by mouth twice daily 180 capsule 1   aspirin EC 81 MG tablet Take 81 mg by mouth daily.       atorvastatin (LIPITOR) 40 MG tablet Take 40 mg by mouth every evening.       furosemide (LASIX) 20 MG tablet Take 1 tablet by mouth daily as needed. Take in morning       JARDIANCE 25 MG TABS tablet Take 1 tablet by mouth every evening.   10   metFORMIN (GLUCOPHAGE) 1000 MG tablet 1 pill twice a day       MYRBETRIQ 50 MG TB24 tablet         olmesartan-hydrochlorothiazide (BENICAR HCT) 40-25 MG tablet TAKE 1 TABLET BY MOUTH ONCE DAILY IN THE MORNING 90 tablet 1   tadalafil (CIALIS) 5 MG tablet Take 5 mg by mouth daily.       tamsulosin (FLOMAX) 0.4 MG CAPS capsule Take 1 capsule by mouth daily after supper.              Home: Home Living Family/patient expects to be discharged to:: Private residence Living Arrangements: Alone Available Help at Discharge: Family Type of Home: House Home Access: Stairs to enter Secretary/administrator of Steps: 2 Entrance Stairs-Rails: None Home Layout: Two level, Full bath on main level, Able to live on main level with bedroom/bathroom Bathroom Shower/Tub: Health visitor: Standard Bathroom Accessibility: Yes Home Equipment: None  Lives With: Alone   Functional History: Prior Function Prior Level of Function : Driving, Independent/Modified Independent Mobility Comments: does not use or own a cane or a walker, mildly unsteady at baseline.  Active with OP PT for his back. ADLs Comments: independent and driving   Functional Status:  Mobility: Bed Mobility Overal bed mobility: Needs Assistance Bed Mobility: Supine to Sit Supine to sit: Mod assist,  HOB elevated General bed mobility comments: OOB in chair Transfers Overall transfer level: Needs assistance Equipment used: None Transfers: Sit to/from Stand Sit to Stand: Min assist General transfer comment: 1x person HHA to steady Ambulation/Gait Ambulation/Gait assistance: Min assist Gait Distance (Feet): 80 Feet (3 bouts of 80 ft) Assistive device: 1 person hand held assist Gait Pattern/deviations: Decreased stride length, Narrow base of support, Step-through pattern, Trunk flexed, Shuffle General Gait Details: reaches for external support, increased forward trunk lean as he fatigues, shuffling gait pattern with fatigue as well, reports legs are feeling weak and tired Gait velocity: decreased Gait velocity interpretation: <  1.8 ft/sec, indicate of risk for recurrent falls   ADL: ADL Overall ADL's : Needs assistance/impaired Grooming: Maximal assistance, Sitting Upper Body Dressing : Maximal assistance, Sitting Lower Body Dressing: +2 for physical assistance, Maximal assistance, +2 for safety/equipment, Sit to/from stand Toilet Transfer: Minimal assistance, +2 for physical assistance, +2 for safety/equipment Functional mobility during ADLs: Minimal assistance, +2 for physical assistance, +2 for safety/equipment General ADL Comments: focused on vision today   Cognition: Cognition Overall Cognitive Status: Impaired/Different from baseline Arousal/Alertness: Awake/alert Orientation Level: Disoriented to time Year: 2024 Month: May (changed to March with repetition) Day of Week: Correct Attention: Sustained, Selective Sustained Attention: Impaired Sustained Attention Impairment: Verbal complex Memory: Impaired Memory Impairment:  (Immediate: 5/5 with repetition x2; delayed: 2/5 with cues: 2/3) Awareness: Impaired Awareness Impairment: Intellectual impairment Problem Solving: Impaired Problem Solving Impairment: Verbal complex (Money: 0/3; time: 0/2) Executive Function:  Sequencing, Occupational hygienist: Impaired Sequencing Impairment: Verbal complex (clock: 0/4) Organizing: Impaired Organizing Impairment: Verbal complex (backward digit span: 1/2) Cognition Arousal/Alertness: Awake/alert Behavior During Therapy: Flat affect Overall Cognitive Status: Impaired/Different from baseline Area of Impairment: Memory, Following commands, Safety/judgement, Awareness, Problem solving, Attention Orientation Level: Disoriented to, Time Current Attention Level: Sustained Memory: Decreased recall of precautions, Decreased short-term memory Following Commands: Follows one step commands consistently, Follows one step commands with increased time, Follows multi-step commands inconsistently Safety/Judgement: Decreased awareness of deficits, Decreased awareness of safety Awareness: Intellectual Problem Solving: Slow processing, Decreased initiation, Difficulty sequencing, Requires verbal cues, Requires tactile cues General Comments: pt continues to have decreased awareness of deficits, safety and decreased problem solving. He is easily distracted and required redirection. During visual and cognitive assessment, pt blaming the "bad" pen for increased difficulty with task. Completed clock drawing task (attempted 2x) with all numbers outside circle of clock and swayed to R, unable to recall time to set without assistance.   Physical Exam: Blood pressure (!) 148/72, pulse 70, temperature 97.8 F (36.6 C), temperature source Oral, resp. rate 16, weight 79.4 kg, SpO2 96 %. Physical Exam Constitutional: No apparent distress. Appropriate appearance for age.  HENT: No JVD. Neck Supple. Trachea midline. Atraumatic, normocephalic. Eyes: PERRLA. EOMI. Visual fields grossly intact; L homonomous hemianopsia from prior exams not appreciated.   Cardiovascular: RRR, no murmurs/rub/gallops. No Edema. Peripheral pulses 2+  Respiratory: CTAB. No rales, rhonchi, or wheezing. On RA.  Abdomen: +  bowel sounds, normoactive. No distention or tenderness.  Skin: + RUE diffuse bruising w/ scab covered in large mepilex MSK:      No apparent deformity.      Strength:                RUE: 4+/5 SA, 5/5 EF, 5/5 EE, 5/5 WE, 5/5 FF, 5/5 FA                 LUE: 4-/5 SA, 5/5 EF, 5/5 EE, 5-/5 WE, 5/5 FF, 5-/5 FA                 RLE: 5-/5 HF, 5/5 KE, 5/5 DF, 5/5 EHL, 5/5 PF                 LLE:  5-/5 HF, 5-/5 KE, 5/5 DF, 5/5 EHL, 5/5 PF   Neurologic exam:  Cognition: AAO to person, place, time and event.  Language: Fluent, No substitutions or neoglisms. No dysarthria. Names 2/3 objects correctly.  Memory: Recalls 1/3 objects at 5 minutes without cueing, 3/3 with.  Insight: Good insight into current condition.  Mood: Pleasant affect,  appropriate mood.  Sensation: To light touch intact in BL UEs and LEs  Reflexes: 2+ in BL UE and LEs. Negative Hoffman's and babinski signs bilaterally.  CN: +Mild L facial weakness Coordination: No apparent tremors. No ataxia on FTN, HTS bilaterally.  Spasticity: MAS 0 in all extremities.           Lab Results Last 48 Hours        Results for orders placed or performed during the hospital encounter of 10/01/22 (from the past 48 hour(s))  Glucose, capillary     Status: Abnormal    Collection Time: 10/04/22 11:44 AM  Result Value Ref Range    Glucose-Capillary 175 (H) 70 - 99 mg/dL      Comment: Glucose reference range applies only to samples taken after fasting for at least 8 hours.    Comment 1 Notify RN      Comment 2 Document in Chart    Basic metabolic panel     Status: Abnormal    Collection Time: 10/04/22  1:27 PM  Result Value Ref Range    Sodium 136 135 - 145 mmol/L    Potassium 3.9 3.5 - 5.1 mmol/L    Chloride 99 98 - 111 mmol/L    CO2 22 22 - 32 mmol/L    Glucose, Bld 166 (H) 70 - 99 mg/dL      Comment: Glucose reference range applies only to samples taken after fasting for at least 8 hours.    BUN 26 (H) 8 - 23 mg/dL    Creatinine, Ser  1.61 (H) 0.61 - 1.24 mg/dL    Calcium 9.0 8.9 - 09.6 mg/dL    GFR, Estimated 48 (L) >60 mL/min      Comment: (NOTE) Calculated using the CKD-EPI Creatinine Equation (2021)      Anion gap 15 5 - 15      Comment: Performed at [facility], [address] [place], [place] [address]  Glucose, capillary     Status: Abnormal    Collection Time: 10/04/22  4:05 PM  Result Value Ref Range    Glucose-Capillary 203 (H) 70 - 99 mg/dL      Comment: Glucose reference range applies only to samples taken after fasting for at least 8 hours.    Comment 1 Notify RN      Comment 2 Document in Chart    Glucose, capillary     Status: Abnormal    Collection Time: 10/04/22  9:26 PM  Result Value Ref Range    Glucose-Capillary 173 (H) 70 - 99 mg/dL      Comment: Glucose reference range applies only to samples taken after fasting for at least 8 hours.  Glucose, capillary     Status: Abnormal    Collection Time: 10/05/22  6:04 AM  Result Value Ref Range    Glucose-Capillary 144 (H) 70 - 99 mg/dL      Comment: Glucose reference range applies only to samples taken after fasting for at least 8 hours.  Glucose, capillary     Status: Abnormal    Collection Time: 10/05/22  8:14 AM  Result Value Ref Range    Glucose-Capillary 133 (H) 70 - 99 mg/dL      Comment: Glucose reference range applies only to samples taken after fasting for at least 8 hours.    Comment 1 Notify RN      Comment 2 Document in Chart    Glucose, capillary     Status: Abnormal  Collection Time: 10/05/22 11:42 AM  Result Value Ref Range    Glucose-Capillary 205 (H) 70 - 99 mg/dL      Comment: Glucose reference range applies only to samples taken after fasting for at least 8 hours.    Comment 1 Notify RN      Comment 2 Document in Chart    Glucose, capillary     Status: Abnormal    Collection Time: 10/05/22  4:13 PM  Result Value Ref Range    Glucose-Capillary 159 (H) 70 - 99 mg/dL      Comment: Glucose reference range applies  only to samples taken after fasting for at least 8 hours.    Comment 1 Notify RN      Comment 2 Document in Chart    Glucose, capillary     Status: Abnormal    Collection Time: 10/05/22  9:12 PM  Result Value Ref Range    Glucose-Capillary 176 (H) 70 - 99 mg/dL      Comment: Glucose reference range applies only to samples taken after fasting for at least 8 hours.    Comment 1 Notify RN    Glucose, capillary     Status: Abnormal    Collection Time: 10/05/22 10:15 PM  Result Value Ref Range    Glucose-Capillary 158 (H) 70 - 99 mg/dL      Comment: Glucose reference range applies only to samples taken after fasting for at least 8 hours.  Basic metabolic panel     Status: Abnormal    Collection Time: 10/06/22  3:47 AM  Result Value Ref Range    Sodium 137 135 - 145 mmol/L    Potassium 3.5 3.5 - 5.1 mmol/L    Chloride 103 98 - 111 mmol/L    CO2 26 22 - 32 mmol/L    Glucose, Bld 134 (H) 70 - 99 mg/dL      Comment: Glucose reference range applies only to samples taken after fasting for at least 8 hours.    BUN 23 8 - 23 mg/dL    Creatinine, Ser 9.14 (H) 0.61 - 1.24 mg/dL    Calcium 9.2 8.9 - 78.2 mg/dL    GFR, Estimated 56 (L) >60 mL/min      Comment: (NOTE) Calculated using the CKD-EPI Creatinine Equation (2021)      Anion gap 8 5 - 15      Comment: Performed at [facility], [address], [place], [place] [address]  Glucose, capillary     Status: Abnormal    Collection Time: 10/06/22  6:42 AM  Result Value Ref Range    Glucose-Capillary 127 (H) 70 - 99 mg/dL      Comment: Glucose reference range applies only to samples taken after fasting for at least 8 hours.      Imaging Results (Last 48 hours)  No results found.         Blood pressure (!) 148/72, pulse 70, temperature 97.8 F (36.6 C), temperature source Oral, resp. rate 16, weight 79.4 kg, SpO2 96 %.   Medical Problem List and Plan: 1. Functional deficits secondary to multiple scattered bilateral ischemic  infarct status post thrombectomy of left P1/PCA             -patient may shower             -ELOS/Goals: 8-12 days, Mod I level PT/OT/SLP 2.  Antithrombotics: -DVT/anticoagulation:  Pharmaceutical: Eliquis             -  antiplatelet therapy: Plavix 75 mg daily 3. Pain Management: Tylenol as needed 4. Mood/Behavior/Sleep: Provide emotional support             -antipsychotic agents: N/A 5. Neuropsych/cognition: This patient is capable of making decisions on his own behalf. 6. Skin/Wound Care: Routine skin checks 7. Fluids/Electrolytes/Nutrition: Routine in and outs with follow-up chemistries 8.  Diabetes mellitus.  Hemoglobin A1c 8.4.  Presently on Semglee 5 units nightly.  Check blood sugars before meals and at bedtime.  Patient on  Glucophage 1000 mg twice daily prior to admission as well as Ozempic and Jardiance 25 mg daily. 9.  Hypertension.Presently on Sectral 200 mg daily, Cialis 5 mg daily.  Monitor with increased mobility.  Patient on Benicar 40-25 mg daily prior to admission.  Resume as needed 10.  AKI.  Latest creatinine 1.25.  Follow-up chemistries 11.  Atrial fibrillation/CAD/CABG.  Status post loop recorder.  Followed by cardiology services.  Cardiac rate controlled.  Continue Eliquis 12.  Hyperlipidemia.  Zetia/Lipitor 13.  BPH.  Flomax 0.4 mg daily, Myrbetriq 50 mg daily 14.  Obesity.  BMI 25.84.  Dietary follow-up       Charlton Amor, PA-C 10/06/2022  I have examined the patient independently and edited the note for HPI, ROS, exam, assessment, and plan as appropriate. I am in agreement with the above recommendations.   Angelina Sheriff, DO 10/06/2022

## 2022-10-06 NOTE — Progress Notes (Signed)
INPATIENT REHABILITATION ADMISSION NOTE   Arrival Method:wheelchair     Mental Orientation:alert   Assessment:done   Skin: Done with Latonia RN  IV'S:none   Pain:none   Tubes and Drains: condom cath   Safety Measures:done   Vital Signs:done   Height and Weight:done   Rehab Orientation:done   Family:daughter    Notes:

## 2022-10-06 NOTE — Progress Notes (Addendum)
Inpatient Rehabilitation Admission Medication Review by a Pharmacist  A complete drug regimen review was completed for this patient to identify any potential clinically significant medication issues.  High Risk Drug Classes Is patient taking? Indication by Medication  Antipsychotic No   Anticoagulant Yes Apixaban for aflutter  Antibiotic No   Opioid No   Antiplatelet No   Hypoglycemics/insulin Yes Insulin aspart and glargine to DM  Vasoactive Medication Yes Acebutolol for HTN   Chemotherapy No   Other Yes Acetaminophen for mild pain Atorvastatin and ezetimibe for HLD Mirabegron for urinary urgency Tadalafil and tamsulosin for BPH Melatonin for sleep      Type of Medication Issue Identified Description of Issue Recommendation(s)  Drug Interaction(s) (clinically significant)     Duplicate Therapy     Allergy     No Medication Administration End Date     Incorrect Dose     Additional Drug Therapy Needed  Should be on Clopidogrel per neurology note Ordered  Significant med changes from prior encounter (inform family/care partners about these prior to discharge). HELD home aspirin, empagliflozin, metformin,olmesart-HCTZ  Per neurology, restart olmesartan-hydrochlorothiazide if renal function stable   Other       Clinically significant medication issues were identified that warrant physician communication and completion of prescribed/recommended actions by midnight of the next day:  yes   Name of provider notified for urgent issues identified: Deatra Ina   Provider Method of Notification: Secure chat   Pharmacist comments: Clinically significant medication issue with clopidogrel was resolved per above. Orders entered.    Time spent performing this drug regimen review (minutes):  20  Alphia Moh, PharmD, Portal, [facility] Clinical Pharmacist  Please check AMION for all [facility] phone numbers After 10:00 PM, call Main Pharmacy [phone]

## 2022-10-06 NOTE — Progress Notes (Signed)
Report called to [facility] on .  Pt will be going to room 09.

## 2022-10-06 NOTE — Progress Notes (Signed)
Expand All Collapse All           Physical Medicine and Rehabilitation Consult Reason for Consult:weakness and balance deficits Referring Physician: Dr. Pearlean Brownie     HPI: [name] is a 88 y.o. male with history of CAD and hypertension status post loop recorder, and diabetes who presented on 616 with diplopia and difficulty with balance.  CTA of the head and neck revealed short segment occlusion of the proximal left P1 segment with additional moderate stenosis in the distal left P2 segment.  Severe stenosis at the origin of the right PI and mild stenosis in the distal basilar artery.  Patient underwent mechanical thrombectomy with complete recanalization.  MRI revealed scattered small acute infarcts in the bilateral cerebellum, as well as left pons and thalamus and minimal occipital lobe restricted diffusion right greater than left.  Patient was placed on Plavix and heparin drip.  Blood pressure was managed.  Patient demonstrating some confusion and lethargy but has begun to show signs of improvement.  Patient was up with physical therapy yesterday and was min to mod assist for ambulation and gait for 75 feet.  He demonstrated a narrow base of support and staggering on the right side with decreased gait velocity and safety awareness.  Needed max cues for visual scanning.  Was min assist for sit to stand was 2 transfers.   Review of Systems  Constitutional: Negative.   HENT: Negative.    Eyes:  Positive for blurred vision and double vision.  Respiratory:  Negative for cough.   Cardiovascular:  Negative for chest pain and palpitations.  Gastrointestinal:  Positive for nausea. Negative for blood in stool.  Genitourinary: Negative.   Musculoskeletal:  Negative for back pain and joint pain.  Skin: Negative.   Neurological:  Positive for dizziness, weakness and headaches. Negative for sensory change.  Psychiatric/Behavioral:  Negative for depression and suicidal ideas.         Past  Medical History:  Diagnosis Date   Acute renal failure (HCC)      secondary to nephrotic syndrome post cath, resolved with steroids   Anxiety     CAD (coronary artery disease) 04/2006    LIMA to LAD, sequential SVG to first, second, and third OM's, sequential SVG to mid RCA and PDA    Depression     Diabetes mellitus without complication [facility])     ED (erectile dysfunction)     HTN (hypertension)     Hx of adenomatous colonic polyps     Hyperlipidemia     Loop recorder: Biotronic Biomonitor II loop 07/28/2021 07/28/2021   Obesity           Past Surgical History:  Procedure Laterality Date   COLONOSCOPY W/ BIOPSIES AND POLYPECTOMY       CORONARY ARTERY BYPASS GRAFT   2008    VESSELS X6   LOOP RECORDER IMPLANT       RADIOLOGY WITH ANESTHESIA N/A 10/02/2022    Procedure: IR WITH ANESTHESIA;  Surgeon: Radiologist, Medication, MD;  Location: [facility] OR;  Service: Radiology;  Laterality: N/A;   UMBILICAL HERNIA REPAIR             Family History  Problem Relation Age of Onset   Heart attack Mother     Clotting disorder Father          died of renal disease   Heart disease Sister     Cancer Sister     Colon cancer Neg Hx  Social History:  reports that he has never smoked. He has never used smokeless tobacco. He reports that he does not drink alcohol and does not use drugs. Allergies:       Allergies  Allergen Reactions   Ketorolac Tromethamine Other (See Comments)      nephrotic syndrome          Medications Prior to Admission  Medication Sig Dispense Refill   acebutolol (SECTRAL) 200 MG capsule Take 1 capsule by mouth twice daily 180 capsule 1   aspirin EC 81 MG tablet Take 81 mg by mouth daily.       atorvastatin (LIPITOR) 40 MG tablet Take 40 mg by mouth every evening.       furosemide (LASIX) 20 MG tablet Take 1 tablet by mouth daily as needed. Take in morning       JARDIANCE 25 MG TABS tablet Take 1 tablet by mouth every evening.   10   metFORMIN (GLUCOPHAGE) 1000 MG  tablet 1 pill twice a day       MYRBETRIQ 50 MG TB24 tablet         olmesartan-hydrochlorothiazide (BENICAR HCT) 40-25 MG tablet TAKE 1 TABLET BY MOUTH ONCE DAILY IN THE MORNING 90 tablet 1   tadalafil (CIALIS) 5 MG tablet Take 5 mg by mouth daily.       tamsulosin (FLOMAX) 0.4 MG CAPS capsule Take 1 capsule by mouth daily after supper.          Home: Home Living Family/patient expects to be discharged to:: Private residence Living Arrangements: Alone Available Help at Discharge: Family, Available PRN/intermittently Type of Home: House Home Access: Stairs to enter Secretary/administrator of Steps: 2 Entrance Stairs-Rails: None Home Layout: Two level, Full bath on main level, Able to live on main level with bedroom/bathroom Bathroom Shower/Tub: Health visitor: Standard Home Equipment: None  Lives With: Alone  Functional History: Prior Function Prior Level of Function : Driving, Independent/Modified Independent Mobility Comments: does not use or own a cane or a walker, mildly unsteady at baseline.  Active with OP PT for his back. ADLs Comments: independent and driving Functional Status:  Mobility: Bed Mobility Overal bed mobility: Needs Assistance Bed Mobility: Supine to Sit Supine to sit: Mod assist, HOB elevated General bed mobility comments: OOB in chair Transfers Overall transfer level: Needs assistance Equipment used: None Transfers: Sit to/from Stand Sit to Stand: Min assist General transfer comment: Light minA to rise and steady Ambulation/Gait Ambulation/Gait assistance: Min assist, +2 safety/equipment Gait Distance (Feet): 90 Feet Assistive device: 1 person hand held assist Gait Pattern/deviations: Decreased stride length, Narrow base of support, Step-through pattern General Gait Details: Pt reaching for external support for increased stability, decreased ability to dual tasking requiring ~2 brief standing rest breaks. MinA for dynamic balance Gait  velocity: decreased   ADL: ADL Overall ADL's : Needs assistance/impaired Grooming: Maximal assistance, Sitting Upper Body Dressing : Maximal assistance, Sitting Lower Body Dressing: +2 for physical assistance, Maximal assistance, +2 for safety/equipment, Sit to/from stand Toilet Transfer: Minimal assistance, +2 for physical assistance, +2 for safety/equipment Functional mobility during ADLs: Minimal assistance, +2 for physical assistance, +2 for safety/equipment General ADL Comments: pt limited by L visual deficits and impaired balance, decreased cognitoin   Cognition: Cognition Overall Cognitive Status: Impaired/Different from baseline Arousal/Alertness: Awake/alert Orientation Level: Oriented to person, Oriented to place, Disoriented to time Year: 2024 Month: May (changed to March with repetition) Day of Week: Correct Attention: Sustained, Selective Sustained Attention: Impaired Sustained Attention  Impairment: Verbal complex Memory: Impaired Memory Impairment:  (Immediate: 5/5 with repetition x2; delayed: 2/5 with cues: 2/3) Awareness: Impaired Awareness Impairment: Intellectual impairment Problem Solving: Impaired Problem Solving Impairment: Verbal complex (Money: 0/3; time: 0/2) Executive Function: Sequencing, Occupational hygienist: Impaired Sequencing Impairment: Verbal complex (clock: 0/4) Organizing: Impaired Organizing Impairment: Verbal complex (backward digit span: 1/2) Cognition Arousal/Alertness: Awake/alert Behavior During Therapy: Flat affect Overall Cognitive Status: Impaired/Different from baseline Area of Impairment: Awareness, Safety/judgement, Orientation, Attention, Memory, Following commands, Problem solving Orientation Level: Disoriented to, Time Current Attention Level: Sustained Memory: Decreased recall of precautions, Decreased short-term memory Following Commands: Follows one step commands consistently, Follows one step commands with increased time,  Follows multi-step commands inconsistently Safety/Judgement: Decreased awareness of safety, Decreased awareness of deficits Awareness: Intellectual Problem Solving: Slow processing, Decreased initiation, Difficulty sequencing, Requires verbal cues, Requires tactile cues General Comments: Decreased working and short term memory; poor awareness of deficits. Able to follow one step commands, but needs cues for sustaining attention   Blood pressure (!) 141/80, pulse 75, temperature 98 F (36.7 C), temperature source Oral, resp. rate 20, weight 79.4 kg, SpO2 97 %. Physical Exam Constitutional:      Comments: Patient was slow to awaken initially but finally did.  In no distress.  HENT:     Head: Normocephalic.     Right Ear: External ear normal.     Left Ear: External ear normal.     Nose: Nose normal.  Eyes:     Extraocular Movements: Extraocular movements intact.     Pupils: Pupils are equal, round, and reactive to light.  Cardiovascular:     Rate and Rhythm: Normal rate and regular rhythm.  Pulmonary:     Effort: Pulmonary effort is normal.  Abdominal:     Palpations: Abdomen is soft.  Musculoskeletal:        General: Normal range of motion.     Cervical back: Normal range of motion.     Right lower leg: No edema.     Left lower leg: No edema.  Skin:    General: Skin is warm and dry.  Neurological:     Comments: Patient was lethargic initially but perked up nicely as we talked.  He is oriented to person and place as well as month and year.  Was able to provide me biographical cranial nerve exam was notable for mild left facial weakness and homonymous hemianopsia.  He seems to track to all visual fields.  He may have had some mild blurred vision diplopia but it was minimal today.  No gross ataxia appreciated.  Strength is grossly 4 to 4+ out of 5 in the left upper and lower extremities.  Right upper extremity and lower extremity were 4+ out of 5.  No focal differences in sensory exam are  appreciated.  Deep tendon reflexes 2+ throughout.  No abnormal resting tone.        Lab Results Last 24 Hours       Results for orders placed or performed during the hospital encounter of 10/01/22 (from the past 24 hour(s))  Glucose, capillary     Status: Abnormal    Collection Time: 10/03/22  4:27 PM  Result Value Ref Range    Glucose-Capillary 140 (H) 70 - 99 mg/dL  Heparin level (unfractionated)     Status: None    Collection Time: 10/03/22  6:59 PM  Result Value Ref Range    Heparin Unfractionated 0.39 0.30 - 0.70 IU/mL  Glucose, capillary  Status: Abnormal    Collection Time: 10/03/22  8:58 PM  Result Value Ref Range    Glucose-Capillary 139 (H) 70 - 99 mg/dL    Comment 1 Notify RN    Heparin level (unfractionated)     Status: None    Collection Time: 10/04/22  4:06 AM  Result Value Ref Range    Heparin Unfractionated 0.34 0.30 - 0.70 IU/mL  CBC     Status: Abnormal    Collection Time: 10/04/22  4:06 AM  Result Value Ref Range    WBC 9.8 4.0 - 10.5 K/uL    RBC 4.10 (L) 4.22 - 5.81 MIL/uL    Hemoglobin 13.0 13.0 - 17.0 g/dL    HCT 45.4 (L) 09.8 - 52.0 %    MCV 93.9 80.0 - 100.0 fL    MCH 31.7 26.0 - 34.0 pg    MCHC 33.8 30.0 - 36.0 g/dL    RDW 11.9 14.7 - 82.9 %    Platelets 154 150 - 400 K/uL    nRBC 0.0 0.0 - 0.2 %  Glucose, capillary     Status: Abnormal    Collection Time: 10/04/22  6:15 AM  Result Value Ref Range    Glucose-Capillary 156 (H) 70 - 99 mg/dL    Comment 1 Notify RN    Glucose, capillary     Status: Abnormal    Collection Time: 10/04/22 11:44 AM  Result Value Ref Range    Glucose-Capillary 175 (H) 70 - 99 mg/dL    Comment 1 Notify RN      Comment 2 Document in Chart         Imaging Results (Last 48 hours)  ECHOCARDIOGRAM COMPLETE   Result Date: 10/02/2022    ECHOCARDIOGRAM REPORT   Patient Name:   DEANO PARELLO Date of Exam: 10/02/2022 Medical Rec #:  [id number]        Height:       69.0 in Accession #:    [id number]       Weight:        175.0 lb Date of Birth:  06-30-1934        BSA:          1.952 m Patient Age:    88 years         BP:           135/71 mmHg Patient Gender: M                HR:           74 bpm. Exam Location:  Inpatient Procedure: 2D Echo, Cardiac Doppler, Color Doppler and Intracardiac            Opacification Agent Indications:    Stroke  History:        Patient has prior history of Echocardiogram examinations, most                 recent 11/18/2019.  Sonographer:    Sheralyn Boatman RDCS Referring Phys: [phone] MCNEILL P [facility]  Sonographer Comments: Technically difficult study due to poor echo windows. Image acquisition challenging due to patient body habitus. Study delayed for patient care. IMPRESSIONS  1. Left ventricular ejection fraction, by estimation, is 60 to 65%. The left ventricle has normal function. The left ventricle has no regional wall motion abnormalities. Left ventricular diastolic parameters are indeterminate.  2. Right ventricular systolic function is mildly reduced. The right ventricular size is normal. Tricuspid regurgitation signal is inadequate for assessing PA  pressure. The estimated right ventricular systolic pressure is 37.8 mmHg.  3. Left atrial size was mild to moderately dilated.  4. The mitral valve is degenerative. Mild mitral valve regurgitation. No evidence of mitral stenosis.  5. The aortic valve is grossly normal. There is mild calcification of the aortic valve. There is mild thickening of the aortic valve. Aortic valve regurgitation is not visualized. No aortic stenosis is present.  6. The inferior vena cava is dilated in size with <50% respiratory variability, suggesting right atrial pressure of 15 mmHg. FINDINGS  Left Ventricle: Left ventricular ejection fraction, by estimation, is 60 to 65%. The left ventricle has normal function. The left ventricle has no regional wall motion abnormalities. Definity contrast agent was given IV to delineate the left ventricular  endocardial borders. The left  ventricular internal cavity size was normal in size. There is no left ventricular hypertrophy. Left ventricular diastolic parameters are indeterminate. Right Ventricle: The right ventricular size is normal. No increase in right ventricular wall thickness. Right ventricular systolic function is mildly reduced. Tricuspid regurgitation signal is inadequate for assessing PA pressure. The tricuspid regurgitant velocity is 2.39 m/s, and with an assumed right atrial pressure of 15 mmHg, the estimated right ventricular systolic pressure is 37.8 mmHg. Left Atrium: Left atrial size was mild to moderately dilated. Right Atrium: Right atrial size was normal in size. Pericardium: There is no evidence of pericardial effusion. Presence of epicardial fat layer. Mitral Valve: The mitral valve is degenerative in appearance. There is mild calcification of the mitral valve leaflet(s). Mild mitral annular calcification. Mild mitral valve regurgitation. No evidence of mitral valve stenosis. Tricuspid Valve: The tricuspid valve is normal in structure. Tricuspid valve regurgitation is trivial. No evidence of tricuspid stenosis. Aortic Valve: The aortic valve is grossly normal. There is mild calcification of the aortic valve. There is mild thickening of the aortic valve. Aortic valve regurgitation is not visualized. No aortic stenosis is present. Pulmonic Valve: The pulmonic valve was normal in structure. Pulmonic valve regurgitation is not visualized. No evidence of pulmonic stenosis. Aorta: The aortic root is normal in size and structure. Venous: The inferior vena cava is dilated in size with less than 50% respiratory variability, suggesting right atrial pressure of 15 mmHg. IAS/Shunts: No atrial level shunt detected by color flow Doppler.  LEFT VENTRICLE PLAX 2D LVIDd:         4.00 cm   Diastology LVIDs:         2.40 cm   LV e' medial:    7.62 cm/s LV PW:         1.10 cm   LV E/e' medial:  16.0 LV IVS:        1.10 cm   LV e' lateral:    8.38 cm/s LVOT diam:     2.40 cm   LV E/e' lateral: 14.6 LV SV:         74 LV SV Index:   38 LVOT Area:     4.52 cm  RIGHT VENTRICLE            IVC RV S prime:     4.57 cm/s  IVC diam: 2.10 cm TAPSE (M-mode): 0.5 cm LEFT ATRIUM             Index        RIGHT ATRIUM           Index LA diam:        4.30 cm 2.20 cm/m   RA Area:  10.20 cm LA Vol (A2C):   38.7 ml 19.83 ml/m  RA Volume:   21.30 ml  10.91 ml/m LA Vol (A4C):   44.7 ml 22.90 ml/m LA Biplane Vol: 45.2 ml 23.16 ml/m  AORTIC VALVE LVOT Vmax:   81.70 cm/s LVOT Vmean:  53.100 cm/s LVOT VTI:    0.164 m  AORTA Ao Root diam: 3.00 cm Ao Asc diam:  3.50 cm MITRAL VALVE                TRICUSPID VALVE MV Area (PHT): 4.15 cm     TR Peak grad:   22.8 mmHg MV Decel Time: 183 msec     TR Vmax:        239.00 cm/s MV E velocity: 122.00 cm/s                             SHUNTS                             Systemic VTI:  0.16 m                             Systemic Diam: 2.40 cm Weston Brass MD Electronically signed by Weston Brass MD Signature Date/Time: 10/02/2022/3:39:50 PM    Final        Assessment/Plan: Diagnosis: 88 year old male with bilateral ischemic infarcts status post thrombectomy.  Likely cardioembolic etiology. Does the need for close, 24 hr/day medical supervision in concert with the patient's rehab needs make it unreasonable for this patient to be served in a less intensive setting? Yes Co-Morbidities requiring supervision/potential complications:  -Hypertension -Paroxysmal atrial fibrillation -Uncontrolled atrial fibrillation -History of CAD -Acute kidney injury Due to bladder management, bowel management, safety, skin/wound care, disease management, medication administration, pain management, and patient education, does the patient require 24 hr/day rehab nursing? Yes Does the patient require coordinated care of a physician, rehab nurse, therapy disciplines of PT, OT, speech therapy to address physical and functional deficits in the  context of the above medical diagnosis(es)? Yes Addressing deficits in the following areas: balance, endurance, locomotion, strength, transferring, bowel/bladder control, bathing, dressing, feeding, grooming, toileting, cognition, and psychosocial support Can the patient actively participate in an intensive therapy program of at least 3 hrs of therapy per day at least 5 days per week? Yes The potential for patient to make measurable gains while on inpatient rehab is excellent Anticipated functional outcomes upon discharge from inpatient rehab are modified independent and supervision  with PT, modified independent and supervision with OT, modified independent and supervision with SLP. Estimated rehab length of stay to reach the above functional goals is: 8-12 days Anticipated discharge destination: Home Overall Rehab/Functional Prognosis: excellent   POST ACUTE RECOMMENDATIONS: This patient's condition is appropriate for continued rehabilitative care in the following setting: CIR Patient has agreed to participate in recommended program. Yes Note that insurance prior authorization may be required for reimbursement for recommended care.   Comment: Patient lives alone and can stay on the first floor.  He has numerous children and friends who can provide assistance once discharged. Rehab Admissions Coordinator to follow up.        I have personally performed a face to face diagnostic evaluation of this patient. Additionally, I have examined the patient's medical record including any pertinent labs and radiographic images. If the physician assistant has documented  in this note, I have reviewed and edited or otherwise concur with the physician assistant's documentation.   Thanks,   Ranelle Oyster, MD 10/04/2022      Revision History  Routing History

## 2022-10-06 NOTE — Discharge Summary (Addendum)
Stroke Discharge Summary  Patient ID: [name]   MRN: [id number]      DOB: 12/27/1934  Date of Admission: 10/01/2022 Date of Discharge: 10/06/2022  Attending Physician:  Delia Heady MD Consultant(s):   cardiology Patient's PCP:  Cleatis Polka., MD  Discharge Diagnoses: Stroke: Multiple scattered bilateral ischemic infarcts s/p thrombectomy for left P1/PCA OCCLUSION WITH RESULTANT TICI 3 revascularization Etiology: Likely cardioembolic Principal Problem:   Hemianopsia Atrial fibrillation Left facial weakness Active Problems:   Right homonymous hemianopsia   Status post surgery   Stroke (cerebrum) (HCC)   Medications to be continued on Rehab Allergies as of 10/06/2022       Reactions   Ketorolac Tromethamine Other (See Comments)   nephrotic syndrome        Medication List     STOP taking these medications    aspirin EC 81 MG tablet   furosemide 20 MG tablet Commonly known as: LASIX   Jardiance 25 MG Tabs tablet Generic drug: empagliflozin   metFORMIN 1000 MG tablet Commonly known as: GLUCOPHAGE   olmesartan-hydrochlorothiazide 40-25 MG tablet Commonly known as: BENICAR HCT       TAKE these medications    acebutolol 200 MG capsule Commonly known as: SECTRAL Take 1 capsule by mouth twice daily   apixaban 2.5 MG Tabs tablet Commonly known as: ELIQUIS Take 1 tablet (2.5 mg total) by mouth 2 (two) times daily.   atorvastatin 80 MG tablet Commonly known as: LIPITOR Take 1 tablet (80 mg total) by mouth daily. Start taking on: October 07, 2022 What changed:  medication strength how much to take when to take this   insulin aspart 100 UNIT/ML injection Commonly known as: novoLOG Inject 0-5 Units into the skin at bedtime.   insulin aspart 100 UNIT/ML injection Commonly known as: novoLOG Inject 0-15 Units into the skin 3 (three) times daily with meals.   insulin glargine-yfgn 100 UNIT/ML Pen Commonly known as: SEMGLEE Inject 5 Units  into the skin at bedtime.   mirabegron ER 50 MG Tb24 tablet Commonly known as: MYRBETRIQ Take 1 tablet (50 mg total) by mouth daily. Start taking on: October 07, 2022 What changed:  how much to take how to take this when to take this   tadalafil 5 MG tablet Commonly known as: CIALIS Take 5 mg by mouth daily.   tamsulosin 0.4 MG Caps capsule Commonly known as: FLOMAX Take 1 capsule by mouth daily after supper.   Zetia 10 MG tablet Generic drug: ezetimibe Take 1 tablet (10 mg total) by mouth every morning. What changed: how much to take        LABORATORY STUDIES CBC    Component Value Date/Time   WBC 9.8 10/04/2022 0406   RBC 4.10 (L) 10/04/2022 0406   HGB 13.0 10/04/2022 0406   HCT 38.5 (L) 10/04/2022 0406   PLT 154 10/04/2022 0406   MCV 93.9 10/04/2022 0406   MCH 31.7 10/04/2022 0406   MCHC 33.8 10/04/2022 0406   RDW 12.9 10/04/2022 0406   LYMPHSABS 2.3 10/01/2022 2057   MONOABS 0.6 10/01/2022 2057   EOSABS 0.4 10/01/2022 2057   BASOSABS 0.1 10/01/2022 2057   CMP    Component Value Date/Time   NA 137 10/06/2022 0347   K 3.5 10/06/2022 0347   CL 103 10/06/2022 0347   CO2 26 10/06/2022 0347   GLUCOSE 134 (H) 10/06/2022 0347   BUN 23 10/06/2022 0347   CREATININE 1.25 (H) 10/06/2022 0347  CALCIUM 9.2 10/06/2022 0347   PROT 7.7 10/01/2022 2057   ALBUMIN 4.3 10/01/2022 2057   AST 20 10/01/2022 2057   ALT 15 10/01/2022 2057   ALKPHOS 64 10/01/2022 2057   BILITOT 0.5 10/01/2022 2057   GFRNONAA 56 (L) 10/06/2022 0347   GFRAA  10/11/2009 0852    >60        The eGFR has been calculated using the MDRD equation. This calculation has not been validated in all clinical situations. eGFR's persistently <60 mL/min signify possible Chronic Kidney Disease.   COAGS Lab Results  Component Value Date   INR 1.0 10/01/2022   Lipid Panel    Component Value Date/Time   CHOL 136 10/02/2022 0227   TRIG 68 10/02/2022 0227   HDL 45 10/02/2022 0227   CHOLHDL 3.0  10/02/2022 0227   VLDL 14 10/02/2022 0227   LDLCALC 77 10/02/2022 0227   HgbA1C  Lab Results  Component Value Date   HGBA1C 8.4 (H) 10/02/2022   Urinalysis    Component Value Date/Time   COLORURINE COLORLESS (A) 10/01/2022 2225   APPEARANCEUR CLEAR 10/01/2022 2225   LABSPEC 1.020 10/01/2022 2225   PHURINE 7.5 10/01/2022 2225   GLUCOSEU 500 (A) 10/01/2022 2225   HGBUR NEGATIVE 10/01/2022 2225   BILIRUBINUR NEGATIVE 10/01/2022 2225   KETONESUR NEGATIVE 10/01/2022 2225   PROTEINUR TRACE (A) 10/01/2022 2225   UROBILINOGEN 1.0 10/11/2009 0815   NITRITE NEGATIVE 10/01/2022 2225   LEUKOCYTESUR NEGATIVE 10/01/2022 2225   Urine Drug Screen     Component Value Date/Time   LABOPIA NONE DETECTED 10/01/2022 2225   COCAINSCRNUR NONE DETECTED 10/01/2022 2225   LABBENZ NONE DETECTED 10/01/2022 2225   AMPHETMU NONE DETECTED 10/01/2022 2225   THCU NONE DETECTED 10/01/2022 2225   LABBARB NONE DETECTED 10/01/2022 2225    Alcohol Level    Component Value Date/Time   ETH <10 10/01/2022 2057     SIGNIFICANT DIAGNOSTIC STUDIES IR PERCUTANEOUS ART THROMBECTOMY/INFUSION INTRACRANIAL INC DIAG ANGIO  Result Date: 10/04/2022 INDICATION: [name] is an 88 year old male presenting with diplopia and right hemianopia; NIHSS 7. He developed diplopia around 7:45 PM on 10/01/2022, but time of onset of hemianopia is unclear since patient did not realize anterior examination. Therefore, he did not receive IV thrombolytic. Head CT showed no acute infarct or hemorrhage. CT angiogram of the head and neck showed a proximal left P1/PCA occlusion. CT perfusion showed no core infarct with a 19 mL penumbra. His past medical history significant for hypertension hyperlipidemia and diabetes; baseline modified Rankin scale 2. He was transferred emergently to our service for mechanical thrombectomy. EXAM: ULTRASOUND-GUIDED VASCULAR ACCESS DIAGNOSTIC CEREBRAL ANGIOGRAM MECHANICAL THROMBECTOMY FLAT PANEL HEAD CT  COMPARISON:  CT/CT angiogram of the head and neck October 01, 2022. MEDICATIONS: No antibiotics administered. ANESTHESIA/SEDATION: The procedure was performed under general anesthesia. CONTRAST:  40 mL Omnipaque 300 milligram/mL FLUOROSCOPY: Radiation Exposure Index (as provided by the fluoroscopic device): 777 mGy Kerma COMPLICATIONS: None immediate. TECHNIQUE: Informed written consent was obtained from the patient's daughter after a thorough discussion of the procedural risks, benefits and alternatives. All questions were addressed. Maximal Sterile Barrier Technique was utilized including caps, mask, sterile gowns, sterile gloves, sterile drape, hand hygiene and skin antiseptic. A timeout was performed prior to the initiation of the procedure. Using the modified Seldinger technique and a micropuncture kit, access was gained to the right radial artery at the wrist and a 7 French sheath was placed. Real-time ultrasound guidance was utilized for vascular access including the  acquisition of a permanent ultrasound image documenting patency of the accessed vessel. Slow intra arterial infusion of 5 mg of verapamil diluted in patient's own blood was performed. No significant fluctuation in patient's blood pressure seen. Then, a right radial artery angiogram was obtained via sheath side port. Next, an exchange length 0.035 "Terumo Glidewire was advanced into the aortic arch under fluoroscopic guidance. The 7 French sheath was removed over the wire. Then, a zoom RDL guide catheter was advanced over the wire into the right subclavian artery. The inner dilator was removed and a 6 [place] Berenstein 2 catheter was advanced through the guide catheter into the right subclavian artery. Frontal and lateral angiograms of the neck were obtained. Using road map guidance, the catheter was then advanced over the wire into the right vertebral artery. The guide catheter was advanced to the distal V2 segment of the right vertebral artery.  Frontal and lateral angiograms of the head were obtained. FINDINGS: 1. Normal brachial artery branching pattern seen. No significant anatomical variation. The right radial artery caliber is adequate for vascular access. 2. Occlusion of the proximal left P1/PCA with clot protruding into the basilar apex. 3. Atherosclerotic changes of the intracranial vertebral arteries and basilar artery with 55% stenosis at the distal V4 segment of the left vertebral artery. PROCEDURE: Using biplane roadmap guidance, a Zoom 55 aspiration catheter was navigated over Colossus 35 microguidewire into the basilar artery. The aspiration catheter was then advanced to the level of occlusion in the left P1 and connected to an aspiration pump. Continuous aspiration was performed for 1.5 minutes. The guide catheter was connected to a VacLok syringe. The aspiration catheter was subsequently removed under constant aspiration. The guide catheter was aspirated for debris. Right vertebral artery angiograms were obtained with frontal and lateral views of the head showing complete recanalization of the left PCA. Atherosclerotic changes with moderate stenosis at the P2 segment was noted. Flat panel CT of the head was obtained and post processed in a separate workstation with concurrent attending physician supervision. Selected images were sent to PACS. No evidence of hemorrhagic complication. The catheter was subsequently withdrawn. An inflatable band was placed and inflated over the right wrist access site. The vascular sheath was withdrawn and the band was slowly deflated until brisk flow was noted through the arteriotomy site. At this point, the band was reinflated with additional 4 cc of air to obtain patent hemostasis. IMPRESSION: Successful and uncomplicated mechanical thrombectomy for treatment of a proximal left P1/PCA occlusion achieving complete recanalization after 1 pass (TICI 3). No thromboembolic or hemorrhagic complication. PLAN:  Patient transferred to ICU for observation. Electronically Signed   By: Baldemar Lenis M.D.   On: 10/04/2022 14:50   IR CT Head Ltd  Result Date: 10/04/2022 INDICATION: [name] is an 88 year old male presenting with diplopia and right hemianopia; NIHSS 7. He developed diplopia around 7:45 PM on 10/01/2022, but time of onset of hemianopia is unclear since patient did not realize anterior examination. Therefore, he did not receive IV thrombolytic. Head CT showed no acute infarct or hemorrhage. CT angiogram of the head and neck showed a proximal left P1/PCA occlusion. CT perfusion showed no core infarct with a 19 mL penumbra. His past medical history significant for hypertension hyperlipidemia and diabetes; baseline modified Rankin scale 2. He was transferred emergently to our service for mechanical thrombectomy. EXAM: ULTRASOUND-GUIDED VASCULAR ACCESS DIAGNOSTIC CEREBRAL ANGIOGRAM MECHANICAL THROMBECTOMY FLAT PANEL HEAD CT COMPARISON:  CT/CT angiogram of the head and neck  October 01, 2022. MEDICATIONS: No antibiotics administered. ANESTHESIA/SEDATION: The procedure was performed under general anesthesia. CONTRAST:  40 mL Omnipaque 300 milligram/mL FLUOROSCOPY: Radiation Exposure Index (as provided by the fluoroscopic device): 777 mGy Kerma COMPLICATIONS: None immediate. TECHNIQUE: Informed written consent was obtained from the patient's daughter after a thorough discussion of the procedural risks, benefits and alternatives. All questions were addressed. Maximal Sterile Barrier Technique was utilized including caps, mask, sterile gowns, sterile gloves, sterile drape, hand hygiene and skin antiseptic. A timeout was performed prior to the initiation of the procedure. Using the modified Seldinger technique and a micropuncture kit, access was gained to the right radial artery at the wrist and a 7 French sheath was placed. Real-time ultrasound guidance was utilized for vascular access including the  acquisition of a permanent ultrasound image documenting patency of the accessed vessel. Slow intra arterial infusion of 5 mg of verapamil diluted in patient's own blood was performed. No significant fluctuation in patient's blood pressure seen. Then, a right radial artery angiogram was obtained via sheath side port. Next, an exchange length 0.035 "Terumo Glidewire was advanced into the aortic arch under fluoroscopic guidance. The 7 French sheath was removed over the wire. Then, a zoom RDL guide catheter was advanced over the wire into the right subclavian artery. The inner dilator was removed and a 6 [place] Berenstein 2 catheter was advanced through the guide catheter into the right subclavian artery. Frontal and lateral angiograms of the neck were obtained. Using road map guidance, the catheter was then advanced over the wire into the right vertebral artery. The guide catheter was advanced to the distal V2 segment of the right vertebral artery. Frontal and lateral angiograms of the head were obtained. FINDINGS: 1. Normal brachial artery branching pattern seen. No significant anatomical variation. The right radial artery caliber is adequate for vascular access. 2. Occlusion of the proximal left P1/PCA with clot protruding into the basilar apex. 3. Atherosclerotic changes of the intracranial vertebral arteries and basilar artery with 55% stenosis at the distal V4 segment of the left vertebral artery. PROCEDURE: Using biplane roadmap guidance, a Zoom 55 aspiration catheter was navigated over Colossus 35 microguidewire into the basilar artery. The aspiration catheter was then advanced to the level of occlusion in the left P1 and connected to an aspiration pump. Continuous aspiration was performed for 1.5 minutes. The guide catheter was connected to a VacLok syringe. The aspiration catheter was subsequently removed under constant aspiration. The guide catheter was aspirated for debris. Right vertebral artery angiograms  were obtained with frontal and lateral views of the head showing complete recanalization of the left PCA. Atherosclerotic changes with moderate stenosis at the P2 segment was noted. Flat panel CT of the head was obtained and post processed in a separate workstation with concurrent attending physician supervision. Selected images were sent to PACS. No evidence of hemorrhagic complication. The catheter was subsequently withdrawn. An inflatable band was placed and inflated over the right wrist access site. The vascular sheath was withdrawn and the band was slowly deflated until brisk flow was noted through the arteriotomy site. At this point, the band was reinflated with additional 4 cc of air to obtain patent hemostasis. IMPRESSION: Successful and uncomplicated mechanical thrombectomy for treatment of a proximal left P1/PCA occlusion achieving complete recanalization after 1 pass (TICI 3). No thromboembolic or hemorrhagic complication. PLAN: Patient transferred to ICU for observation. Electronically Signed   By: Baldemar Lenis M.D.   On: 10/04/2022 14:50   IR US  Guide Vasc Access Right  Result Date: 10/04/2022 INDICATION: ODIN MARIANI is an 88 year old male presenting with diplopia and right hemianopia; NIHSS 7. He developed diplopia around 7:45 PM on 10/01/2022, but time of onset of hemianopia is unclear since patient did not realize anterior examination. Therefore, he did not receive IV thrombolytic. Head CT showed no acute infarct or hemorrhage. CT angiogram of the head and neck showed a proximal left P1/PCA occlusion. CT perfusion showed no core infarct with a 19 mL penumbra. His past medical history significant for hypertension hyperlipidemia and diabetes; baseline modified Rankin scale 2. He was transferred emergently to our service for mechanical thrombectomy. EXAM: ULTRASOUND-GUIDED VASCULAR ACCESS DIAGNOSTIC CEREBRAL ANGIOGRAM MECHANICAL THROMBECTOMY FLAT PANEL HEAD CT COMPARISON:   CT/CT angiogram of the head and neck October 01, 2022. MEDICATIONS: No antibiotics administered. ANESTHESIA/SEDATION: The procedure was performed under general anesthesia. CONTRAST:  40 mL Omnipaque 300 milligram/mL FLUOROSCOPY: Radiation Exposure Index (as provided by the fluoroscopic device): 777 mGy Kerma COMPLICATIONS: None immediate. TECHNIQUE: Informed written consent was obtained from the patient's daughter after a thorough discussion of the procedural risks, benefits and alternatives. All questions were addressed. Maximal Sterile Barrier Technique was utilized including caps, mask, sterile gowns, sterile gloves, sterile drape, hand hygiene and skin antiseptic. A timeout was performed prior to the initiation of the procedure. Using the modified Seldinger technique and a micropuncture kit, access was gained to the right radial artery at the wrist and a 7 French sheath was placed. Real-time ultrasound guidance was utilized for vascular access including the acquisition of a permanent ultrasound image documenting patency of the accessed vessel. Slow intra arterial infusion of 5 mg of verapamil diluted in patient's own blood was performed. No significant fluctuation in patient's blood pressure seen. Then, a right radial artery angiogram was obtained via sheath side port. Next, an exchange length 0.035 "Terumo Glidewire was advanced into the aortic arch under fluoroscopic guidance. The 7 French sheath was removed over the wire. Then, a zoom RDL guide catheter was advanced over the wire into the right subclavian artery. The inner dilator was removed and a 6 [place] Berenstein 2 catheter was advanced through the guide catheter into the right subclavian artery. Frontal and lateral angiograms of the neck were obtained. Using road map guidance, the catheter was then advanced over the wire into the right vertebral artery. The guide catheter was advanced to the distal V2 segment of the right vertebral artery. Frontal and  lateral angiograms of the head were obtained. FINDINGS: 1. Normal brachial artery branching pattern seen. No significant anatomical variation. The right radial artery caliber is adequate for vascular access. 2. Occlusion of the proximal left P1/PCA with clot protruding into the basilar apex. 3. Atherosclerotic changes of the intracranial vertebral arteries and basilar artery with 55% stenosis at the distal V4 segment of the left vertebral artery. PROCEDURE: Using biplane roadmap guidance, a Zoom 55 aspiration catheter was navigated over Colossus 35 microguidewire into the basilar artery. The aspiration catheter was then advanced to the level of occlusion in the left P1 and connected to an aspiration pump. Continuous aspiration was performed for 1.5 minutes. The guide catheter was connected to a VacLok syringe. The aspiration catheter was subsequently removed under constant aspiration. The guide catheter was aspirated for debris. Right vertebral artery angiograms were obtained with frontal and lateral views of the head showing complete recanalization of the left PCA. Atherosclerotic changes with moderate stenosis at the P2 segment was noted. Flat panel CT of the head was  obtained and post processed in a separate workstation with concurrent attending physician supervision. Selected images were sent to PACS. No evidence of hemorrhagic complication. The catheter was subsequently withdrawn. An inflatable band was placed and inflated over the right wrist access site. The vascular sheath was withdrawn and the band was slowly deflated until brisk flow was noted through the arteriotomy site. At this point, the band was reinflated with additional 4 cc of air to obtain patent hemostasis. IMPRESSION: Successful and uncomplicated mechanical thrombectomy for treatment of a proximal left P1/PCA occlusion achieving complete recanalization after 1 pass (TICI 3). No thromboembolic or hemorrhagic complication. PLAN: Patient  transferred to ICU for observation. Electronically Signed   By: Baldemar Lenis M.D.   On: 10/04/2022 14:50   ECHOCARDIOGRAM COMPLETE  Result Date: 10/02/2022    ECHOCARDIOGRAM REPORT   Patient Name:   [name] Date of Exam: 10/02/2022 Medical Rec #:  [id number]        Height:       69.0 in Accession #:    [id number]       Weight:       175.0 lb Date of Birth:  11/28/1934        BSA:          1.952 m Patient Age:    88 years         BP:           135/71 mmHg Patient Gender: M                HR:           74 bpm. Exam Location:  Inpatient Procedure: 2D Echo, Cardiac Doppler, Color Doppler and Intracardiac            Opacification Agent Indications:    Stroke  History:        Patient has prior history of Echocardiogram examinations, most                 recent 11/18/2019.  Sonographer:    Sheralyn Boatman RDCS Referring Phys: [phone] MCNEILL P [facility]  Sonographer Comments: Technically difficult study due to poor echo windows. Image acquisition challenging due to patient body habitus. Study delayed for patient care. IMPRESSIONS  1. Left ventricular ejection fraction, by estimation, is 60 to 65%. The left ventricle has normal function. The left ventricle has no regional wall motion abnormalities. Left ventricular diastolic parameters are indeterminate.  2. Right ventricular systolic function is mildly reduced. The right ventricular size is normal. Tricuspid regurgitation signal is inadequate for assessing PA pressure. The estimated right ventricular systolic pressure is 37.8 mmHg.  3. Left atrial size was mild to moderately dilated.  4. The mitral valve is degenerative. Mild mitral valve regurgitation. No evidence of mitral stenosis.  5. The aortic valve is grossly normal. There is mild calcification of the aortic valve. There is mild thickening of the aortic valve. Aortic valve regurgitation is not visualized. No aortic stenosis is present.  6. The inferior vena cava is dilated in size with <50%  respiratory variability, suggesting right atrial pressure of 15 mmHg. FINDINGS  Left Ventricle: Left ventricular ejection fraction, by estimation, is 60 to 65%. The left ventricle has normal function. The left ventricle has no regional wall motion abnormalities. Definity contrast agent was given IV to delineate the left ventricular  endocardial borders. The left ventricular internal cavity size was normal in size. There is no left ventricular hypertrophy. Left ventricular diastolic parameters are indeterminate.  Right Ventricle: The right ventricular size is normal. No increase in right ventricular wall thickness. Right ventricular systolic function is mildly reduced. Tricuspid regurgitation signal is inadequate for assessing PA pressure. The tricuspid regurgitant velocity is 2.39 m/s, and with an assumed right atrial pressure of 15 mmHg, the estimated right ventricular systolic pressure is 37.8 mmHg. Left Atrium: Left atrial size was mild to moderately dilated. Right Atrium: Right atrial size was normal in size. Pericardium: There is no evidence of pericardial effusion. Presence of epicardial fat layer. Mitral Valve: The mitral valve is degenerative in appearance. There is mild calcification of the mitral valve leaflet(s). Mild mitral annular calcification. Mild mitral valve regurgitation. No evidence of mitral valve stenosis. Tricuspid Valve: The tricuspid valve is normal in structure. Tricuspid valve regurgitation is trivial. No evidence of tricuspid stenosis. Aortic Valve: The aortic valve is grossly normal. There is mild calcification of the aortic valve. There is mild thickening of the aortic valve. Aortic valve regurgitation is not visualized. No aortic stenosis is present. Pulmonic Valve: The pulmonic valve was normal in structure. Pulmonic valve regurgitation is not visualized. No evidence of pulmonic stenosis. Aorta: The aortic root is normal in size and structure. Venous: The inferior vena cava is dilated  in size with less than 50% respiratory variability, suggesting right atrial pressure of 15 mmHg. IAS/Shunts: No atrial level shunt detected by color flow Doppler.  LEFT VENTRICLE PLAX 2D LVIDd:         4.00 cm   Diastology LVIDs:         2.40 cm   LV e' medial:    7.62 cm/s LV PW:         1.10 cm   LV E/e' medial:  16.0 LV IVS:        1.10 cm   LV e' lateral:   8.38 cm/s LVOT diam:     2.40 cm   LV E/e' lateral: 14.6 LV SV:         74 LV SV Index:   38 LVOT Area:     4.52 cm  RIGHT VENTRICLE            IVC RV S prime:     4.57 cm/s  IVC diam: 2.10 cm TAPSE (M-mode): 0.5 cm LEFT ATRIUM             Index        RIGHT ATRIUM           Index LA diam:        4.30 cm 2.20 cm/m   RA Area:     10.20 cm LA Vol (A2C):   38.7 ml 19.83 ml/m  RA Volume:   21.30 ml  10.91 ml/m LA Vol (A4C):   44.7 ml 22.90 ml/m LA Biplane Vol: 45.2 ml 23.16 ml/m  AORTIC VALVE LVOT Vmax:   81.70 cm/s LVOT Vmean:  53.100 cm/s LVOT VTI:    0.164 m  AORTA Ao Root diam: 3.00 cm Ao Asc diam:  3.50 cm MITRAL VALVE                TRICUSPID VALVE MV Area (PHT): 4.15 cm     TR Peak grad:   22.8 mmHg MV Decel Time: 183 msec     TR Vmax:        239.00 cm/s MV E velocity: 122.00 cm/s  SHUNTS                             Systemic VTI:  0.16 m                             Systemic Diam: 2.40 cm Weston Brass MD Electronically signed by Weston Brass MD Signature Date/Time: 10/02/2022/3:39:50 PM    Final    MR BRAIN WO CONTRAST  Result Date: 10/02/2022 CLINICAL DATA:  88 year old male code stroke presentation yesterday, short segment left P1 occlusion and clot protruding into the distal basilar artery. Status post mechanical thrombectomy. EXAM: MRI HEAD WITHOUT CONTRAST TECHNIQUE: Multiplanar, multiecho pulse sequences of the brain and surrounding structures were obtained without intravenous contrast. COMPARISON:  CT head, CTA and CTP yesterday. FINDINGS: Brain: Patchy restricted diffusion in both cerebellar hemispheres,  more so on the left at the AICA and/or SCA territories (series 2, image 15). Subtle small focus of restricted diffusion in the left superior pons just below the midbrain best seen on series 3, image 17. No other convincing brainstem restricted diffusion. Similar small focus in the left lateral thalamus series 2, image 28. And there is an adjacent chronic left thalamic lacunar infarct. DWI edge artifact throughout the cerebral hemispheres, but there could be small foci of abnormal occipital pole diffusion, possibly also some right occipital white matter diffusion restriction on series 2, image 19. Subtle T2/FLAIR hyperintensity at the affected areas. No hemorrhagic transformation or mass effect. Nonspecific ventriculomegaly which might be cerebral volume loss related. Nomidline shift, mass effect, evidence of mass lesion, ventriculomegaly, extra-axial collection or acute intracranial hemorrhage. Cervicomedullary junction and pituitary are within normal limits. Patchy and confluent bilateral cerebral white matter T2 and FLAIR hyperintensity. Chronic left thalamic lacunar infarct as stated above, otherwise deep gray nuclei T2 heterogeneity appears mostly due to perivascular spaces. Chronic microhemorrhages also in both thalami on SWI, and in the right corona radiata. Vascular: Major intracranial vascular flow voids are preserved. Skull and upper cervical spine: Negative for age visible cervical spine. Visualized bone marrow signal is within normal limits. Sinuses/Orbits: Postoperative changes to both globes. Paranasal sinus mucoperiosteal thickening again noted. Other: Mastoids are well aerated. Grossly normal visible internal auditory structures. IMPRESSION: 1. Small acute infarcts scattered in the bilateral cerebellum (left greater than right). Punctate acute lacunar infarcts in both the left pons and thalamus. And minimal occipital lobe restricted diffusion, perhaps greater on the right. 2. No hemorrhagic  transformation or intracranial mass effect. 3. Underlying chronic small vessel disease, including chronic thalamic microhemorrhages, and nonspecific ventricular enlargement. Electronically Signed   By: Odessa Fleming M.D.   On: 10/02/2022 05:46   CT CEREBRAL PERFUSION W CONTRAST  Result Date: 10/01/2022 CLINICAL DATA:  Diplopia, stroke suspected EXAM: CT ANGIOGRAPHY HEAD AND NECK CT PERFUSION BRAIN TECHNIQUE: Multidetector CT imaging of the head and neck was performed using the standard protocol during bolus administration of intravenous contrast. Multiplanar CT image reconstructions and MIPs were obtained to evaluate the vascular anatomy. Carotid stenosis measurements (when applicable) are obtained utilizing NASCET criteria, using the distal internal carotid diameter as the denominator. Multiphase CT imaging of the brain was performed following IV bolus contrast injection. Subsequent parametric perfusion maps were calculated using RAPID software. RADIATION DOSE REDUCTION: This exam was performed according to the departmental dose-optimization program which includes automated exposure control, adjustment of the mA and/or kV according to patient size and/or use  of iterative reconstruction technique. CONTRAST:  OMNIPAQUE IOHEXOL 350 MG/ML SOLN COMPARISON:  No prior CTA available, correlation is made with CT head 10/01/2022 FINDINGS: CT HEAD FINDINGS For noncontrast findings, please see same day CT head. CTA NECK FINDINGS Aortic arch: Four-vessel arch, with the left vertebral artery originating from the aorta. Imaged portion shows no evidence of aneurysm or dissection. No significant stenosis of the major arch vessel origins. Aortic atherosclerosis, with significant noncalcified and possibly ulcerated plaque. Right carotid system: No evidence of dissection, occlusion, or hemodynamically significant stenosis (greater than 50%). Retropharyngeal course of the proximal right ICA. Left carotid system: No evidence of  dissection, occlusion, or hemodynamically significant stenosis (greater than 50%). Vertebral arteries: No evidence of dissection, occlusion, or hemodynamically significant stenosis (greater than 50%). Skeleton: No acute osseous abnormality. Degenerative changes in the cervical spine. Status post median sternotomy. Other neck: No acute finding. Upper chest: No focal pulmonary opacity or pleural effusion. Review of the MIP images confirms the above findings CTA HEAD FINDINGS Anterior circulation: Both internal carotid arteries are patent to the termini, with mild stenosis in the left cavernous segment, severe stenosis in the left supraclinoid segment proximally, and moderate stenosis in the right supraclinoid segment. A1 segments patent. Normal anterior communicating artery. Anterior cerebral arteries are patent to their distal aspects without significant stenosis. No M1 stenosis or occlusion. MCA branches perfused to their distal aspects without significant stenosis. Posterior circulation: Vertebral arteries patent to the vertebrobasilar junction without significant stenosis. Severe stenosis at the origin of the right PICA (series 7, image 136). The left PICA is patent. Basilar patent to its distal aspect with mild stenosis in the distal basilar (series 7, image 120). Superior cerebellar arteries patent proximally. Short segment occlusion of the proximal left P 1 segment (series 7, image 108). Additional moderate stenosis in the distal left P2 segment (series 7, image 112). Patent right P1 segment. The right PCA branches are patent to to their distal aspects with some irregularity but without significant stenosis. The right posterior communicating artery is patent. The left posterior communicating artery is not visualized. Venous sinuses: As permitted by contrast timing, patent. Anatomic variants: None significant. Review of the MIP images confirms the above findings CT Brain Perfusion Findings: ASPECTS: 10 CBF  (<30%) Volume: 0mL Perfusion (Tmax>6.0s) volume: 19mL Mismatch Volume: 19mL Infarction Location:No infarct core identified. Possible area of decreased perfusion in the superior left cerebellum, along the posterior and lateral aspects, or left occipital lobe. IMPRESSION: 1. Short segment occlusion of the proximal left P1 segment, with additional moderate stenosis in the distal left P2 segment. 2. No infarct core identified. Possible area of decreased perfusion in the superior left cerebellum or left occipital lobe. This could reflect decreased perfusion related to short segment left P1 occlusion. Superior cerebellar decreased perfusion 3. Severe stenosis in the left supraclinoid ICA and moderate stenosis in the right supraclinoid ICA. 4. Severe stenosis at the origin of the right PICA. Mild stenosis in the distal basilar artery. 5. No hemodynamically significant stenosis in the neck. 6. Aortic atherosclerosis. Aortic Atherosclerosis (ICD10-I70.0). These results were called by telephone at the time of interpretation on 10/01/2022 at 10:59 pm to provider [facility] , who verbally acknowledged these results. Electronically Signed   By: Wiliam Ke M.D.   On: 10/01/2022 22:59   CT ANGIO HEAD NECK W WO CM  Result Date: 10/01/2022 CLINICAL DATA:  Diplopia, stroke suspected EXAM: CT ANGIOGRAPHY HEAD AND NECK CT PERFUSION BRAIN TECHNIQUE: Multidetector CT imaging of the  head and neck was performed using the standard protocol during bolus administration of intravenous contrast. Multiplanar CT image reconstructions and MIPs were obtained to evaluate the vascular anatomy. Carotid stenosis measurements (when applicable) are obtained utilizing NASCET criteria, using the distal internal carotid diameter as the denominator. Multiphase CT imaging of the brain was performed following IV bolus contrast injection. Subsequent parametric perfusion maps were calculated using RAPID software. RADIATION DOSE REDUCTION: This  exam was performed according to the departmental dose-optimization program which includes automated exposure control, adjustment of the mA and/or kV according to patient size and/or use of iterative reconstruction technique. CONTRAST:  OMNIPAQUE IOHEXOL 350 MG/ML SOLN COMPARISON:  No prior CTA available, correlation is made with CT head 10/01/2022 FINDINGS: CT HEAD FINDINGS For noncontrast findings, please see same day CT head. CTA NECK FINDINGS Aortic arch: Four-vessel arch, with the left vertebral artery originating from the aorta. Imaged portion shows no evidence of aneurysm or dissection. No significant stenosis of the major arch vessel origins. Aortic atherosclerosis, with significant noncalcified and possibly ulcerated plaque. Right carotid system: No evidence of dissection, occlusion, or hemodynamically significant stenosis (greater than 50%). Retropharyngeal course of the proximal right ICA. Left carotid system: No evidence of dissection, occlusion, or hemodynamically significant stenosis (greater than 50%). Vertebral arteries: No evidence of dissection, occlusion, or hemodynamically significant stenosis (greater than 50%). Skeleton: No acute osseous abnormality. Degenerative changes in the cervical spine. Status post median sternotomy. Other neck: No acute finding. Upper chest: No focal pulmonary opacity or pleural effusion. Review of the MIP images confirms the above findings CTA HEAD FINDINGS Anterior circulation: Both internal carotid arteries are patent to the termini, with mild stenosis in the left cavernous segment, severe stenosis in the left supraclinoid segment proximally, and moderate stenosis in the right supraclinoid segment. A1 segments patent. Normal anterior communicating artery. Anterior cerebral arteries are patent to their distal aspects without significant stenosis. No M1 stenosis or occlusion. MCA branches perfused to their distal aspects without significant stenosis. Posterior  circulation: Vertebral arteries patent to the vertebrobasilar junction without significant stenosis. Severe stenosis at the origin of the right PICA (series 7, image 136). The left PICA is patent. Basilar patent to its distal aspect with mild stenosis in the distal basilar (series 7, image 120). Superior cerebellar arteries patent proximally. Short segment occlusion of the proximal left P 1 segment (series 7, image 108). Additional moderate stenosis in the distal left P2 segment (series 7, image 112). Patent right P1 segment. The right PCA branches are patent to to their distal aspects with some irregularity but without significant stenosis. The right posterior communicating artery is patent. The left posterior communicating artery is not visualized. Venous sinuses: As permitted by contrast timing, patent. Anatomic variants: None significant. Review of the MIP images confirms the above findings CT Brain Perfusion Findings: ASPECTS: 10 CBF (<30%) Volume: 0mL Perfusion (Tmax>6.0s) volume: 19mL Mismatch Volume: 19mL Infarction Location:No infarct core identified. Possible area of decreased perfusion in the superior left cerebellum, along the posterior and lateral aspects, or left occipital lobe. IMPRESSION: 1. Short segment occlusion of the proximal left P1 segment, with additional moderate stenosis in the distal left P2 segment. 2. No infarct core identified. Possible area of decreased perfusion in the superior left cerebellum or left occipital lobe. This could reflect decreased perfusion related to short segment left P1 occlusion. Superior cerebellar decreased perfusion 3. Severe stenosis in the left supraclinoid ICA and moderate stenosis in the right supraclinoid ICA. 4. Severe stenosis at the origin  of the right PICA. Mild stenosis in the distal basilar artery. 5. No hemodynamically significant stenosis in the neck. 6. Aortic atherosclerosis. Aortic Atherosclerosis (ICD10-I70.0). These results were called by  telephone at the time of interpretation on 10/01/2022 at 10:59 pm to provider [facility] , who verbally acknowledged these results. Electronically Signed   By: Wiliam Ke M.D.   On: 10/01/2022 22:59   CT HEAD CODE STROKE WO CONTRAST  Result Date: 10/01/2022 CLINICAL DATA:  Code stroke.  Double vision EXAM: CT HEAD WITHOUT CONTRAST TECHNIQUE: Contiguous axial images were obtained from the base of the skull through the vertex without intravenous contrast. RADIATION DOSE REDUCTION: This exam was performed according to the departmental dose-optimization program which includes automated exposure control, adjustment of the mA and/or kV according to patient size and/or use of iterative reconstruction technique. COMPARISON:  03/11/2017 FINDINGS: Brain: No evidence of acute infarction, hemorrhage, mass, mass effect, or midline shift. No hydrocephalus or extra-axial collection. Age related cerebral atrophy. Ex vacuo dilatation of the ventricles. Periventricular white matter changes, likely the sequela of chronic small vessel ischemic disease. Vascular: No hyperdense vessel. Skull: Negative for fracture or focal lesion. Sinuses/Orbits: Chronic bilateral maxillary sinusitis. Mucosal thickening in the ethmoid air cells. Status post bilateral lens replacements. Dysconjugate gaze Other: The mastoid air cells are well aerated. ASPECTS [facility] Stroke Program Early CT Score) - Ganglionic level infarction (caudate, lentiform nuclei, internal capsule, insula, M1-M3 cortex): 7 - Supraganglionic infarction (M4-M6 cortex): 3 Total score (0-10 with 10 being normal): 10 IMPRESSION: 1. No acute intracranial process. 2. ASPECTS is 10. Code stroke imaging results were communicated on 10/01/2022 at 9:23 pm to provider [facility] via telephone, who verbally acknowledged these results. Electronically Signed   By: Wiliam Ke M.D.   On: 10/01/2022 21:24       HISTORY OF PRESENT ILLNESS [name] is a 88 y.o. male with  history of CAD, hypertension, hyperlipidemia, s/p loop recorder, diabetes, anxiety and depression presenting with diplopia. MRI shows small acute infarcts in the bilateral cerebellum. He is on eliquis.   HOSPITAL COURSE Stroke: Multiple scattered bilateral ischemic infarcts s/p thrombectomy of left P1/PCA TICI 3 Etiology: Likely cardioembolic Code Stroke  CT head No acute abnormality. ASPECTS 10.    CTA head & neck Short segment occlusion of the proximal left P1 segment, with additional moderate stenosis in the distal left P2 segment. Severe stenosis at the origin of the right PICA. Mild stenosis in the distal basilar artery. CT perfusion no core infarct Cerebral angio Proximal occlusion of the left P1/PCA with clot protruding into the distal basilar artery. Mechanical thrombectomy performed with direct contact aspiration with complete recanalization after one pass (TICI3)  Post IR CT no hemorrhage MRI  Small acute infarcts scattered in the bilateral cerebellum (left greater than right). Punctate acute lacunar infarcts in both the left pons and thalamus. And minimal occipital lobe restricted diffusion, perhaps greater on the right. 2D Echo left ventricular ejection fraction 60 to 65%.  No wall motion abnormalities.  Left atrial size mild to moderately dilated. LDL 77 HgbA1c 8.4 VTE prophylaxis -SCDs       Diet    Diet Carb Modified Fluid consistency: Thin; Room service appropriate? Yes        Aspirin 81 mg prior to admission, now on on eliquis alone     Therapy recommendations: CIR Disposition: CIR   Hypertensive emergency Home meds: Acebutolol, olmesartan/hydrochlorothiazide Stable  SBP goal normotensive Continue home olmesartan/hydrochlorothiazide, if serum creatinine stable/improved  Atrial Flutter Atrial Fibrillation Seen on loop recorder, paroxysmal episodes Will need to continue eliquis only    Hyperlipidemia Home meds: Atorvastatin 40, resumed in hospital LDL 77, goal  < 70 Zetia 10mg  added this hospitalization  Continue statin and zetia at discharge   Diabetes type II UnControlled Home meds: Metformin, Jardiance HgbA1c 8.4, goal < 7.0 CBGs Recent Labs (last 2 labs)       Recent Labs    10/05/22 0604 10/05/22 0814 10/05/22 1142  GLUCAP 144* 133* 205*       Will need close PCP follow-up for diabetes management   Other Stroke Risk Factors Advanced Age >/= 65  Coronary artery disease   Other Active Problems ACute kidney injury-CR 1.39-continue IV fluids BPH restart home meds  DISCHARGE EXAM Blood pressure (!) 148/72, pulse 70, temperature 97.8 F (36.6 C), temperature source Oral, resp. rate 16, weight 79.4 kg, SpO2 96 %.  Constitutional: Well-developed, well-nourished, and in no distress.  Cardiovascular: Normal rate, intact distal pulses. No gallop and no friction rub.  No murmur heard. No lower extremity edema  Pulmonary: Non labored breathing on room air, no wheezing or rales  Abdominal: Soft. Normal bowel sounds. Non distended and non tender Skin: Skin is warm and dry.      Mental Status -  Alert and oriented to person, place, and time.  Language expression and comprehension remain intact.   Cranial Nerves II - XII - II - Visual field left hemianopia- ? Partial improvement III, IV, VI - Extraocular movements intact. V - L facial droop, stable  VII - Facial movement intact bilaterally. VIII - Hearing grossly intact  X - Palate elevates symmetrically.     Motor Strength - Trace LUE weakness, decreased grip strength stable from day prior exam    Gait and Station - deferred.  Discharge Diet      Diet   Diet Carb Modified Fluid consistency: Thin; Room service appropriate? Yes   liquids  DISCHARGE PLAN Disposition:  Transfer to [facility] for ongoing PT, OT and ST Eliquis (apixaban) daily for secondary stroke prevention  Recommend ongoing stroke risk factor control by Primary Care Physician at time of  discharge from inpatient rehabilitation. Follow-up PCP Cleatis Polka., MD in 2 weeks following discharge from rehab. Follow-up in [facility] in 8 weeks following discharge from rehab, office to schedule an appointment.   35 minutes were spent preparing discharge.  Marolyn Haller, MD PGY-3 Internal Medicine Resident  Pager [phone]  I have personally obtained history,examined this patient, reviewed notes, independently viewed imaging studies, participated in medical decision making and plan of care.ROS completed by me personally and pertinent positives fully documented  I have made any additions or clarifications directly to the above note. Agree with note above.    Delia Heady, MD Medical Director [facility] Pager: [phone] 10/09/2022 9:18 AM

## 2022-10-06 NOTE — Progress Notes (Addendum)
IP rehab admissions - I have received approval from [employer] insurance for acute inpatient rehab admission.  Will check with attending MD to check medical readiness for CIR.  We do have beds available today.  Call me for questions.  [phone]  I have attending MD approval to admit to CIR today.  [phone]

## 2022-10-06 NOTE — Plan of Care (Signed)
  Problem: RH BLADDER ELIMINATION Goal: RH STG MANAGE BLADDER WITH ASSISTANCE Description: STG Manage Bladder With mod I Assistance Outcome: Not Progressing; condom cath; incontinence

## 2022-10-06 NOTE — Progress Notes (Signed)
PMR Admission Coordinator Pre-Admission Assessment   Patient: [name] is an 88 y.o., male MRN: [id number] DOB: 29-Oct-1934 Height: 5'9" Weight: 79.4 kg                                                                                                                                                  Insurance Information HMO:  yes   PPO: PCP:      IPA:      80/20:      OTHER:  PRIMARY: [employer]      Policy#: [id number]      Subscriber: Pt CM Name: [name] at [phone]      Phone#: [phone]     Fax#: [phone] Pre-Cert#: [id number] for initial 7 days       Employer: Retired Benefits:  [phone]-option 1, spoke with [name] Date: 04/18/2022 - 04/18/2023 Deductible: no deductible ($0) OOP Max: $2,900 ($0.76 met) CIR: $200/day co-pay for days 1-5, $0/day days 6-90 SNF:  $0/day co-pay for days 1-20, $203/day co-pay for days 21-100; limited to 100 days/benefit period Outpatient: $0/visit co-pay; limited by medical necessity Home Health:  100% coverage DME: 80% coverage; 20% co-insurance Providers: in network    SECONDARY:       Policy#:       Phone#:    Artist:       Phone#:    The Data processing manager" for patients in Inpatient Rehabilitation Facilities with attached "Privacy Act Statement-Health Care Records" was provided and verbally reviewed with: Patient   Emergency Contact Information Contact Information       Name Relation Home Work Mobile    [name] Daughter     [phone]    Fernandez,Mike Other     [phone]         Current Medical History  Patient Admitting Diagnosis: B CVA    History of Present Illness:  An 88 year old right-handed male with history of diabetes mellitus, obesity with BMI 25.84.  Atrial flutter with history of loop recorder, hypertension, hyperlipidemia, CAD with CABG 2008 maintained on low-dose aspirin.  Per chart review patient lives alone.  Two-level home bed bath main level.  Independent  prior to admission.  Presented 10/01/2022 with diplopia and difficulty with balance.  Admission chemistries unremarkable except BUN 28 creatinine 1.39, hemoglobin A1c 8.4, urine drug screen negative.  Cranial CT scan negative.  CTA/CT cerebral perfusion scan showed short segment occlusion of the proximal left P1 segment with additional moderate stenosis in the distal left P2 segment.  Severe stenosis left supraclinoid ICA and moderate stenosis of the right supraclinoid ICA.  Severe stenosis at the origin of the right PICA.  Patient underwent mechanical thrombectomy with complete recanalization per interventional radiology.  MRI follow-up revealed scattered small acute infarcts in the bilateral cerebellum as well as left pons and thalamus with minimal occipital lobe restricted  diffusion right greater than left.  Echo with ejection fraction of 60 to 65% no wall motion abnormalities.  Neurology follow-up currently maintained on Eliquis as well as Plavix after discussed with neurology services as well as cardiology Dr. Jacinto Halim.  Tolerating a regular consistency diet.  Therapy evaluations completed due to patient decreased functional mobility was admitted for a comprehensive rehab program.    Complete NIHSS TOTAL: 2 Glasgow Coma Scale Score: 15   Patient's medical record from [facility]  has been reviewed by the rehabilitation admission coordinator and physician.   Past Medical History      Past Medical History:  Diagnosis Date   Acute renal failure (HCC)      secondary to nephrotic syndrome post cath, resolved with steroids   Anxiety     CAD (coronary artery disease) 04/2006    LIMA to LAD, sequential SVG to first, second, and third OM's, sequential SVG to mid RCA and PDA    Depression     Diabetes mellitus without complication (HCC)     ED (erectile dysfunction)     HTN (hypertension)     Hx of adenomatous colonic polyps     Hyperlipidemia     Loop recorder: Biotronic Biomonitor II  loop 07/28/2021 07/28/2021   Obesity        Has the patient had major surgery during 100 days prior to admission? Yes   Family History  family history includes Cancer in his sister; Clotting disorder in his father; Heart attack in his mother; Heart disease in his sister.     Current Medications    Current Facility-Administered Medications:    acebutolol (SECTRAL) capsule 200 mg, 200 mg, Oral, Daily, Marolyn Haller, MD, 200 mg at 10/06/22 6295   acetaminophen (TYLENOL) tablet 650 mg, 650 mg, Oral, Q4H PRN, 650 mg at 10/04/22 1011 **OR** acetaminophen (TYLENOL) 160 MG/5ML solution 650 mg, 650 mg, Per Tube, Q4H PRN **OR** acetaminophen (TYLENOL) suppository 650 mg, 650 mg, Rectal, Q4H PRN, [name], MD   apixaban Everlene Balls) tablet 2.5 mg, 2.5 mg, Oral, BID, Yates Decamp, MD, 2.5 mg at 10/06/22 2841   atorvastatin (LIPITOR) tablet 80 mg, 80 mg, Oral, Daily, Gevena Mart A, NP, 80 mg at 10/06/22 3244   [COMPLETED] clopidogrel (PLAVIX) tablet 300 mg, 300 mg, Oral, Once, 300 mg at 10/02/22 0459 **AND** clopidogrel (PLAVIX) tablet 75 mg, 75 mg, Oral, Daily, Yates Decamp, MD, 75 mg at 10/06/22 0102   ezetimibe (ZETIA) tablet 10 mg, 10 mg, Oral, q morning, Gevena Mart A, NP, 10 mg at 10/06/22 7253   hydrALAZINE (APRESOLINE) injection 5 mg, 5 mg, Intravenous, Q6H PRN, Richardo Priest, Erin C, NP   insulin aspart (novoLOG) injection 0-15 Units, 0-15 Units, Subcutaneous, TID WC, Hetty Blend C, NP, 3 Units at 10/06/22 1118   insulin aspart (novoLOG) injection 0-5 Units, 0-5 Units, Subcutaneous, QHS, Wolfe, Denise A, NP   insulin glargine-yfgn (SEMGLEE) injection 5 Units, 5 Units, Subcutaneous, QHS, Marolyn Haller, MD, 5 Units at 10/05/22 2152   melatonin tablet 3 mg, 3 mg, Oral, QHS PRN, [name], MD   mirabegron [facility]) tablet 50 mg, 50 mg, Oral, Daily, Gevena Mart A, NP, 50 mg at 10/06/22 6644   nitroGLYCERIN 200 mcg in sodium chloride (PF) 0.9 % 59.74 mL (25 mcg/mL)  syringe, , Intra-arterial, PRN, de Melchor Amour, Jerilynn Mages, MD, 200 mcg at 10/02/22 0020   ondansetron (ZOFRAN) injection 4 mg, 4 mg, Intravenous, Q6H PRN, Gevena Mart A, NP, 4 mg at  10/03/22 0742   Oral care mouth rinse, 15 mL, Mouth Rinse, PRN, [name], MD   senna-docusate (Senokot-S) tablet 2 tablet, 2 tablet, Oral, QHS, Micki Riley, MD, 2 tablet at 10/05/22 2152   tadalafil (CIALIS) tablet 5 mg, 5 mg, Oral, Daily, Gevena Mart A, NP, 5 mg at 10/06/22 1610   tamsulosin (FLOMAX) capsule 0.4 mg, 0.4 mg, Oral, QPC supper, Gevena Mart A, NP, 0.4 mg at 10/05/22 1713   verapamil (ISOPTIN) injection, , , PRN, de Melchor Amour, Jennings Lodge, MD, 5 mg at 10/02/22 0018   Patients Current Diet:  Diet Order                  Diet Carb Modified Fluid consistency: Thin; Room service appropriate? Yes  Diet effective now                         Precautions / Restrictions Precautions Precautions: Fall Precaution Comments: L hemianopsia Restrictions Weight Bearing Restrictions: No    Has the patient had 2 or more falls or a fall with injury in the past year?No   Prior Activity Level Community (5-7x/wk): Pt. is active in the community PTA   Prior Functional Level Prior Function Prior Level of Function : Driving, Independent/Modified Independent Mobility Comments: does not use or own a cane or a walker, mildly unsteady at baseline.  Active with OP PT for his back. ADLs Comments: independent and driving   Self Care: Did the patient need help bathing, dressing, using the toilet or eating?  Independent   Indoor Mobility: Did the patient need assistance with walking from room to room (with or without device)? Independent   Stairs: Did the patient need assistance with internal or external stairs (with or without device)? Independent   Functional Cognition: Did the patient need help planning regular tasks such as shopping or remembering to take medications?  Independent   Patient Information Are you of Hispanic, Latino/a,or Spanish origin?: A. No, not of Hispanic, Latino/a, or Spanish origin What is your race?: A. White Do you need or want an interpreter to communicate with a doctor or health care staff?: 0. No   Patient's Response To:  Health Literacy and Transportation Is the patient able to respond to health literacy and transportation needs?: Yes Health Literacy - How often do you need to have someone help you when you read instructions, pamphlets, or other written material from your doctor or pharmacy?: Never In the past 12 months, has lack of transportation kept you from medical appointments or from getting medications?: No In the past 12 months, has lack of transportation kept you from meetings, work, or from getting things needed for daily living?: No   Home Assistive Devices / Equipment Home Equipment: None   Prior Device Use: Indicate devices/aids used by the patient prior to current illness, exacerbation or injury? None of the above   Current Functional Level Cognition   Arousal/Alertness: Awake/alert Overall Cognitive Status: Impaired/Different from baseline Current Attention Level: Sustained Orientation Level: Disoriented to time Following Commands: Follows one step commands consistently, Follows one step commands with increased time, Follows multi-step commands inconsistently Safety/Judgement: Decreased awareness of deficits, Decreased awareness of safety General Comments: pt continues to have decreased awareness of deficits, safety and decreased problem solving. He is easily distracted and required redirection. During visual and cognitive assessment, pt blaming the "bad" pen for increased difficulty with task. Completed clock drawing task (attempted 2x) with all numbers outside circle of  clock and swayed to R, unable to recall time to set without assistance. Attention: Sustained, Selective Sustained Attention:  Impaired Sustained Attention Impairment: Verbal complex Memory: Impaired Memory Impairment:  (Immediate: 5/5 with repetition x2; delayed: 2/5 with cues: 2/3) Awareness: Impaired Awareness Impairment: Intellectual impairment Problem Solving: Impaired Problem Solving Impairment: Verbal complex (Money: 0/3; time: 0/2) Executive Function: Sequencing, Occupational hygienist: Impaired Sequencing Impairment: Verbal complex (clock: 0/4) Organizing: Impaired Organizing Impairment: Verbal complex (backward digit span: 1/2)    Extremity Assessment (includes Sensation/Coordination)   Upper Extremity Assessment: LUE deficits/detail LUE Deficits / Details: decreased sesnation and functional use, poor awareness of side but overall strength WFL poor coordination LUE Sensation: decreased light touch, decreased proprioception LUE Coordination: decreased fine motor, decreased gross motor  Lower Extremity Assessment: Defer to PT evaluation RLE Deficits / Details: right leg appeared mildly weaker than left leg per bed level gross assessment.  Sensation equal and intact bil to LT LLE Deficits / Details: stronger than R leg per bed level gross assessment.     ADLs   Overall ADL's : Needs assistance/impaired Grooming: Maximal assistance, Sitting Upper Body Dressing : Maximal assistance, Sitting Lower Body Dressing: +2 for physical assistance, Maximal assistance, +2 for safety/equipment, Sit to/from stand Toilet Transfer: Minimal assistance, +2 for physical assistance, +2 for safety/equipment Functional mobility during ADLs: Minimal assistance, +2 for physical assistance, +2 for safety/equipment General ADL Comments: focused on vision today     Mobility   Overal bed mobility: Needs Assistance Bed Mobility: Supine to Sit Supine to sit: Mod assist, HOB elevated General bed mobility comments: OOB in chair     Transfers   Overall transfer level: Needs assistance Equipment used: None Transfers: Sit  to/from Stand Sit to Stand: Min assist General transfer comment: 1x person HHA to steady     Ambulation / Gait / Stairs / Psychologist, prison and probation services   Ambulation/Gait Ambulation/Gait assistance: Editor, commissioning (Feet): 80 Feet (3 bouts of 80 ft) Assistive device: 1 person hand held assist Gait Pattern/deviations: Decreased stride length, Narrow base of support, Step-through pattern, Trunk flexed, Shuffle General Gait Details: reaches for external support, increased forward trunk lean as he fatigues, shuffling gait pattern with fatigue as well, reports legs are feeling weak and tired Gait velocity: decreased Gait velocity interpretation: <1.8 ft/sec, indicate of risk for recurrent falls     Posture / Balance Dynamic Sitting Balance Sitting balance - Comments: limited dynamically Balance Overall balance assessment: Needs assistance Sitting-balance support: No upper extremity supported, Feet supported Sitting balance-Leahy Scale: Fair Sitting balance - Comments: limited dynamically Standing balance support: No upper extremity supported, During functional activity, Single extremity supported Standing balance-Leahy Scale: Poor Standing balance comment:  (relies on HHA and continues to reach out for external support) High level balance activites: Direction changes, Turns, Sudden stops, Head turns High Level Balance Comments: Pt able to increase and decrease gait speed with no LOB observed, can turn head multidirectional with slight decrease in speed and anterior trunk lean. Able to clear an obstacle on the ground with fair foot clearance and can navigate around an obstaclce with increased time. Navigated bilat turning with 3-5 steps and increased time, minor LOB observed. Min A required for small LOB.     Special needs/care consideration Special service needs visual deficits         Previous Home Environment (from acute therapy documentation) Living Arrangements: Alone  Lives With:  Alone Available Help at Discharge: Family Type of Home: House Home Layout: Two level, Full bath  on main level, Able to live on main level with bedroom/bathroom Home Access: Stairs to enter Entrance Stairs-Rails: None Entrance Stairs-Number of Steps: 2 Bathroom Shower/Tub: Health visitor: Standard Bathroom Accessibility: Yes How Accessible: Accessible via walker, Accessible via wheelchair Home Care Services: No   Discharge Living Setting Plans for Discharge Living Setting: Patient's home Type of Home at Discharge: House Discharge Home Layout: Two level, Able to live on main level with bedroom/bathroom, Full bath on main level Alternate Level Stairs-Number of Steps: flight Discharge Home Access: Stairs to enter Entrance Stairs-Rails: None Entrance Stairs-Number of Steps: 2 Discharge Bathroom Shower/Tub: Walk-in shower Discharge Bathroom Toilet: Standard Discharge Bathroom Accessibility: Yes How Accessible: Accessible via walker, Accessible via wheelchair Does the patient have any problems obtaining your medications?: No   Social/Family/Support Systems Patient Roles: Other (Comment) Contact Information: [phone] Anticipated Caregiver: Verlon Au (daughter) Ability/Limitations of Caregiver: Family states that they will rotate to provide 24/7 assist Caregiver Availability: 24/7 Discharge Plan Discussed with Primary Caregiver: Yes Is Caregiver In Agreement with Plan?: Yes Does Caregiver/Family have Issues with Lodging/Transportation while Pt is in Rehab?: Yes     Goals Patient/Family Goal for Rehab: PT/OT/SLP mod I and supervision Expected length of stay: 8-12 days Pt/Family Agrees to Admission and willing to participate: Yes Program Orientation Provided & Reviewed with Pt/Caregiver Including Roles  & Responsibilities: Yes     Decrease burden of Care through IP rehab admission: not anticipated     Possible need for SNF placement upon discharge: Not  anticipated      Patient Condition: I have reviewed medical records from [facility] , spoken with CM, and patient and family member. I met with patient at the bedside for inpatient rehabilitation assessment.  Patient will benefit from ongoing PT, OT, and SLP, can actively participate in 3 hours of therapy a day 5 days of the week, and can make measurable gains during the admission.  Patient will also benefit from the coordinated team approach during an Inpatient Acute Rehabilitation admission.  The patient will receive intensive therapy as well as Rehabilitation physician, nursing, social worker, and care management interventions.  Due to safety, skin/wound care, disease management, medication administration, pain management, and patient education the patient requires 24 hour a day rehabilitation nursing.  The patient is currently min to mod assist with mobility and basic ADLs.  Discharge setting and therapy post discharge at home with home health is anticipated.  Patient has agreed to participate in the Acute Inpatient Rehabilitation Program and will admit today.   Preadmission Screen Completed By:  Trish Mage, RN, 10/06/2022 11:35 AM ______________________________________________________________________   Discussed status with Dr. Riley Kill on 10/06/22 at 0930 and received approval for admission today.   Admission Coordinator:  Trish Mage, time11:33 am /Date 10/06/22           Cosigned by: Angelina Sheriff, DO at 10/06/2022 11:57 AM   Revision History

## 2022-10-07 DIAGNOSIS — I10 Essential (primary) hypertension: Secondary | ICD-10-CM | POA: Diagnosis not present

## 2022-10-07 DIAGNOSIS — N179 Acute kidney failure, unspecified: Secondary | ICD-10-CM | POA: Diagnosis not present

## 2022-10-07 DIAGNOSIS — E119 Type 2 diabetes mellitus without complications: Secondary | ICD-10-CM | POA: Diagnosis not present

## 2022-10-07 DIAGNOSIS — I639 Cerebral infarction, unspecified: Secondary | ICD-10-CM | POA: Diagnosis not present

## 2022-10-07 LAB — CBC WITH DIFFERENTIAL/PLATELET
Abs Immature Granulocytes: 0.03 10*3/uL (ref 0.00–0.07)
Basophils Absolute: 0.1 10*3/uL (ref 0.0–0.1)
Basophils Relative: 1 %
Eosinophils Absolute: 0.3 10*3/uL (ref 0.0–0.5)
Eosinophils Relative: 3 %
HCT: 38 % — ABNORMAL LOW (ref 39.0–52.0)
Hemoglobin: 13.2 g/dL (ref 13.0–17.0)
Immature Granulocytes: 0 %
Lymphocytes Relative: 19 %
Lymphs Abs: 1.5 10*3/uL (ref 0.7–4.0)
MCH: 32.8 pg (ref 26.0–34.0)
MCHC: 34.7 g/dL (ref 30.0–36.0)
MCV: 94.3 fL (ref 80.0–100.0)
Monocytes Absolute: 0.7 10*3/uL (ref 0.1–1.0)
Monocytes Relative: 9 %
Neutro Abs: 5.2 10*3/uL (ref 1.7–7.7)
Neutrophils Relative %: 68 %
Platelets: 180 10*3/uL (ref 150–400)
RBC: 4.03 MIL/uL — ABNORMAL LOW (ref 4.22–5.81)
RDW: 13.2 % (ref 11.5–15.5)
WBC: 7.7 10*3/uL (ref 4.0–10.5)
nRBC: 0 % (ref 0.0–0.2)

## 2022-10-07 LAB — COMPREHENSIVE METABOLIC PANEL
ALT: 47 U/L — ABNORMAL HIGH (ref 0–44)
AST: 51 U/L — ABNORMAL HIGH (ref 15–41)
Albumin: 3 g/dL — ABNORMAL LOW (ref 3.5–5.0)
Alkaline Phosphatase: 54 U/L (ref 38–126)
Anion gap: 11 (ref 5–15)
BUN: 23 mg/dL (ref 8–23)
CO2: 27 mmol/L (ref 22–32)
Calcium: 9.3 mg/dL (ref 8.9–10.3)
Chloride: 100 mmol/L (ref 98–111)
Creatinine, Ser: 1.34 mg/dL — ABNORMAL HIGH (ref 0.61–1.24)
GFR, Estimated: 52 mL/min — ABNORMAL LOW (ref >60)
Glucose, Bld: 158 mg/dL — ABNORMAL HIGH (ref 70–99)
Potassium: 3.8 mmol/L (ref 3.5–5.1)
Sodium: 138 mmol/L (ref 135–145)
Total Bilirubin: 1.1 mg/dL (ref 0.3–1.2)
Total Protein: 6.1 g/dL — ABNORMAL LOW (ref 6.5–8.1)

## 2022-10-07 LAB — GLUCOSE, CAPILLARY
Glucose-Capillary: 119 mg/dL — ABNORMAL HIGH (ref 70–99)
Glucose-Capillary: 202 mg/dL — ABNORMAL HIGH (ref 70–99)
Glucose-Capillary: 147 mg/dL — ABNORMAL HIGH (ref 70–99)
Glucose-Capillary: 193 mg/dL — ABNORMAL HIGH (ref 70–99)

## 2022-10-07 MED ORDER — HYDROCHLOROTHIAZIDE 12.5 MG PO TABS
12.5000 mg | ORAL_TABLET | Freq: Every day | ORAL | Status: DC
  Administered 2022-10-07: 12.5 mg via ORAL
  Filled 2022-10-07: qty 1

## 2022-10-07 MED ORDER — OLMESARTAN MEDOXOMIL 20 MG PO TABS
20.0000 mg | ORAL_TABLET | Freq: Every day | ORAL | Status: DC
  Administered 2022-10-07 – 2022-10-12 (×6): 20 mg via ORAL
  Filled 2022-10-07 (×6): qty 1

## 2022-10-07 MED ORDER — EMPAGLIFLOZIN 25 MG PO TABS
25.0000 mg | ORAL_TABLET | Freq: Every day | ORAL | Status: DC
  Administered 2022-10-07 – 2022-10-12 (×6): 25 mg via ORAL
  Filled 2022-10-07 (×6): qty 1

## 2022-10-07 NOTE — Evaluation (Signed)
Speech Language Pathology Assessment and Plan  Patient Details  Name: [name] MRN: [id number] Date of Birth: 08-05-1934  SLP Diagnosis:     Today's Date: 10/07/2022 SLP Individual Time: 1100-1210 SLP Individual Time Calculation (min): 70 min  Hospital Problem: Principal Problem:   Ischemic cerebrovascular accident (CVA) [facility])  Past Medical History:  Past Medical History:  Diagnosis Date   Acute renal failure (HCC)    secondary to nephrotic syndrome post cath, resolved with steroids   Anxiety    CAD (coronary artery disease) 04/2006   LIMA to LAD, sequential SVG to first, second, and third OM's, sequential SVG to mid RCA and PDA    Depression    Diabetes mellitus without complication (HCC)    ED (erectile dysfunction)    HTN (hypertension)    Hx of adenomatous colonic polyps    Hyperlipidemia    Loop recorder: Biotronic Biomonitor II loop 07/28/2021 07/28/2021   Obesity    Past Surgical History:  Past Surgical History:  Procedure Laterality Date   COLONOSCOPY W/ BIOPSIES AND POLYPECTOMY     CORONARY ARTERY BYPASS GRAFT  2008   VESSELS X6   IR CT HEAD LTD  10/02/2022   IR PERCUTANEOUS ART THROMBECTOMY/INFUSION INTRACRANIAL INC DIAG ANGIO  10/02/2022   IR US GUIDE VASC ACCESS RIGHT  10/02/2022   LOOP RECORDER IMPLANT     RADIOLOGY WITH ANESTHESIA N/A 10/02/2022   Procedure: IR WITH ANESTHESIA;  Surgeon: Radiologist, Medication, MD;  Location: [facility] OR;  Service: Radiology;  Laterality: N/A;   UMBILICAL HERNIA REPAIR      Assessment / Plan / Recommendation Clinical Impression HPI: Patient is an 88 year old right-handed male with presented 10/01/2022 with diplopia and difficulty with balance. CTA/CT cerebral perfusion scan showed short segment occlusion of the proximal left P1 segment with additional moderate stenosis in the distal left P2 segment. Severe stenosis left supraclinoid ICA and moderate stenosis of the right supraclinoid ICA. Severe stenosis at the origin of the  right PICA. Patient underwent mechanical thrombectomy with complete recanalization per interventional radiology. MRI follow-up revealed scattered small acute infarcts in the bilateral cerebellum as well as left pons and thalamus with minimal occipital lobe restricted diffusion right greater than left. Tolerating a regular consistency diet. Patient admitted to [facility] for comprehensive rehab evaluations.   SLP completed the Cognistat and portions of the CLQT to assess cognitive linguistic function. Patient scored within functional limits for all subtests, indicating functional cognitive skills. Of note, patient was hyper-verbose and tangential throughout the evaluation, impacting his sustained attention, however this is likely baseline. Patient reports daughter aids in medication and financial management tasks, therefore adequate measures are in place to ensure safety at d/c.  Patient does not need any further Speech Language Pathology services at this time. If there is a change of status, please contact SLP for a complete re-evaluation.     Skilled Therapeutic Interventions          A standardized cognitive-linguistic evaluation was completed. Please see above for clinical impression.  SLP Assessment  Patient does not need any further Speech Lanaguage Pathology Services    Recommendations  Recommendations for Other Services: Neuropsych consult Patient destination: Home Follow up Recommendations: None Equipment Recommended: None recommended by SLP    Pain Pain Assessment Pain Scale: Faces Pain Score: 0-No pain Faces Pain Scale: No hurt  SLP Evaluation Cognition Overall Cognitive Status: Within Functional Limits for tasks assessed Arousal/Alertness: Awake/alert Orientation Level: Oriented X4 Year: 2024 Month: June Day of Week: Correct  Attention: Appears intact Memory: Appears intact Awareness: Appears intact Problem Solving: Appears intact Executive Function: Sequencing;Self  Monitoring;Organizing Sequencing: Appears intact Organizing: Appears intact Self Monitoring: Impaired Safety/Judgment: Appears intact  Comprehension Auditory Comprehension Overall Auditory Comprehension: Appears within functional limits for tasks assessed Expression Expression Primary Mode of Expression: Verbal Verbal Expression Overall Verbal Expression: Appears within functional limits for tasks assessed Written Expression Dominant Hand: Right Oral Motor Oral Motor/Sensory Function Overall Oral Motor/Sensory Function: Within functional limits Motor Speech Overall Motor Speech: Appears within functional limits for tasks assessed Respiration: Within functional limits Phonation: Normal Resonance: Within functional limits Articulation: Within functional limitis Intelligibility: Intelligible Motor Planning: Witnin functional limits Motor Speech Errors: Not applicable  Care Tool Care Tool Cognition Ability to hear (with hearing aid or hearing appliances if normally used Ability to hear (with hearing aid or hearing appliances if normally used): 1. Minimal difficulty - difficulty in some environments (e.g. when person speaks softly or setting is noisy)   Expression of Ideas and Wants Expression of Ideas and Wants: 4. Without difficulty (complex and basic) - expresses complex messages without difficulty and with speech that is clear and easy to understand   Understanding Verbal and Non-Verbal Content Understanding Verbal and Non-Verbal Content: 4. Understands (complex and basic) - clear comprehension without cues or repetitions  Memory/Recall Ability Memory/Recall Ability : Current season;That he or she is in a hospital/hospital unit    Short Term Goals: N/A  Refer to Care Plan for Long Term Goals N/A  Recommendations for other services: None   Discharge Criteria: Patient will be discharged from SLP if patient refuses treatment 3 consecutive times without medical reason, if  treatment goals not met, if there is a change in medical status, if patient makes no progress towards goals or if patient is discharged from hospital.  The above assessment, treatment plan, treatment alternatives and goals were discussed and mutually agreed upon: by patient  Orville Widmann M.A., CF-SLP 10/07/2022, 3:12 PM

## 2022-10-07 NOTE — IPOC Note (Signed)
Overall Plan of Care [facility]) Patient Details Name: [name] MRN: [id number] DOB: 10-26-1934  Admitting Diagnosis: Ischemic cerebrovascular accident (CVA) [facility])  Hospital Problems: Principal Problem:   Ischemic cerebrovascular accident (CVA) [facility])     Functional Problem List: Nursing Bowel, Safety, Skin Integrity, Medication Management, Endurance, Bladder  PT Balance, Endurance, Motor, Safety  OT Balance, Vision, Behavior, Safety, Endurance, Motor, Cognition  SLP    TR         Basic ADL's: OT Bathing, Dressing, Toileting     Advanced  ADL's: OT Simple Meal Preparation     Transfers: PT Bed Mobility, Bed to Chair, Car, Floor  OT Toilet, Tub/Shower     Locomotion: PT Stairs, Ambulation     Additional Impairments: OT    SLP        TR      Anticipated Outcomes Item Anticipated Outcome  Self Feeding Independent  Swallowing      Basic self-care  Supervision/ Set up  Toileting  Mod I   Bathroom Transfers Mod I  Bowel/Bladder  manage bowel and bladder with mod I assist  Transfers  Mod I  Locomotion  Supervision  Communication     Cognition     Pain  N/A  Safety/Judgment  manage w cues   Therapy Plan: PT Intensity: Minimum of 1-2 x/day ,45 to 90 minutes PT Frequency: 5 out of 7 days PT Duration Estimated Length of Stay: 1 week OT Intensity: Minimum of 1-2 x/day, 45 to 90 minutes OT Frequency: 5 out of 7 days OT Duration/Estimated Length of Stay: 7 days SLP Duration/Estimated Length of Stay: N/A   Team Interventions: Nursing Interventions Bladder Management, Medication Management, Discharge Planning, Bowel Management, Skin Care/Wound Management, Disease Management/Prevention, Patient/Family Education  PT interventions Ambulation/gait training, Discharge planning, Functional mobility training, Therapeutic Activities, Psychosocial support, Visual/perceptual remediation/compensation, Therapeutic Exercise, Wheelchair propulsion/positioning, Skin  care/wound management, Neuromuscular re-education, Disease management/prevention, Warden/ranger, Cognitive remediation/compensation, DME/adaptive equipment instruction, UE/LE Strength taining/ROM, Splinting/orthotics, Pain management, Community reintegration, Development worker, international aid stimulation, Patient/family education, Museum/gallery curator, UE/LE Coordination activities  OT Interventions Warden/ranger, Disease mangement/prevention, Neuromuscular re-education, Patient/family education, Self Care/advanced ADL retraining, Therapeutic Exercise, UE/LE Coordination activities, Visual/perceptual remediation/compensation, UE/LE Strength taining/ROM, Therapeutic Activities, Functional mobility training, DME/adaptive equipment instruction, Discharge planning, Cognitive remediation/compensation  SLP Interventions    TR Interventions    SW/CM Interventions Discharge Planning, Psychosocial Support, Patient/Family Education   Barriers to Discharge MD  Medical stability  Nursing Lack of/limited family support, Home environment access/layout 2 level 2 ste no rail solo; family plan to provide 24/7 care;does not use or own a cane or a walker, mildly unsteady at baseline.  Active with OP PT for his back  PT Decreased caregiver support, Lack of/limited family support    OT      SLP      SW       Team Discharge Planning: Destination: PT-Home ,OT- Home , SLP-Home Projected Follow-up: PT-Outpatient PT, 24 hour supervision/assistance, OT-  Home health OT, SLP-None Projected Equipment Needs: PT-To be determined, OT- To be determined, SLP-None recommended by SLP Equipment Details: PT- , OT-  Patient/family involved in discharge planning: PT- Patient,  OT-Patient, SLP-Patient  MD ELOS: 5-7 days Medical Rehab Prognosis:  Excellent Assessment: The patient has been admitted for CIR therapies with the diagnosis of CVA. The team will be addressing functional mobility, strength, stamina, balance,  safety, adaptive techniques and equipment, self-care, bowel and bladder mgt, patient and caregiver education, NMR, community reentry, cognition. Goals have been set  at mod I for mobility and self-care. Anticipated discharge destination is home with family.        See Team Conference Notes for weekly updates to the plan of care

## 2022-10-07 NOTE — Progress Notes (Signed)
Inpatient Rehabilitation Care Coordinator Assessment and Plan Patient Details  Name: [name] MRN: [id number] Date of Birth: 24-Feb-1935  Today's Date: 10/07/2022  Hospital Problems: Principal Problem:   Ischemic cerebrovascular accident (CVA) [facility])  Past Medical History:  Past Medical History:  Diagnosis Date   Acute renal failure (HCC)    secondary to nephrotic syndrome post cath, resolved with steroids   Anxiety    CAD (coronary artery disease) 04/2006   LIMA to LAD, sequential SVG to first, second, and third OM's, sequential SVG to mid RCA and PDA    Depression    Diabetes mellitus without complication (HCC)    ED (erectile dysfunction)    HTN (hypertension)    Hx of adenomatous colonic polyps    Hyperlipidemia    Loop recorder: Biotronic Biomonitor II loop 07/28/2021 07/28/2021   Obesity    Past Surgical History:  Past Surgical History:  Procedure Laterality Date   COLONOSCOPY W/ BIOPSIES AND POLYPECTOMY     CORONARY ARTERY BYPASS GRAFT  2008   VESSELS X6   IR CT HEAD LTD  10/02/2022   IR PERCUTANEOUS ART THROMBECTOMY/INFUSION INTRACRANIAL INC DIAG ANGIO  10/02/2022   IR US GUIDE VASC ACCESS RIGHT  10/02/2022   LOOP RECORDER IMPLANT     RADIOLOGY WITH ANESTHESIA N/A 10/02/2022   Procedure: IR WITH ANESTHESIA;  Surgeon: Radiologist, Medication, MD;  Location: [facility] OR;  Service: Radiology;  Laterality: N/A;   UMBILICAL HERNIA REPAIR     Social History:  reports that he has never smoked. He has never used smokeless tobacco. He reports that he does not drink alcohol and does not use drugs.  Family / Support Systems Marital Status: Widow/Widower Patient Roles: Parent, Other (Comment) (sibling, friend) Children: [name]-daughter [phone] Other Supports: Ingrid-friend [phone] Anticipated Caregiver: Children and other's Ability/Limitations of Caregiver: Family to work together on pt's needs and or hired paid Geneticist, molecular Availability: 24/7 (short term) Family  Dynamics: Close knit family who will assist and provide pt's care needs. Although pt feels he will be fine and hopes to go home soon-early next week.  Social History Preferred language: English Religion: Non-Denominational Cultural Background: No issues Education: Charity fundraiser - How often do you need to have someone help you when you read instructions, pamphlets, or other written material from your doctor or pharmacy?: Never Writes: Yes Employment Status: Retired Marine scientist Issues: No issues Guardian/Conservator: None-according to MD pt is capable of making his own decisions while here. Pt is very vocal and hopes to be mod/i at discharge   Abuse/Neglect Abuse/Neglect Assessment Can Be Completed: Yes Physical Abuse: Denies Verbal Abuse: Denies Sexual Abuse: Denies Exploitation of patient/patient's resources: Denies Self-Neglect: Denies  Patient response to: Social Isolation - How often do you feel lonely or isolated from those around you?: Never  Emotional Status Pt's affect, behavior and adjustment status: Pt is very motivated to do well and regain his independence from his stroke. He has always been very independent and travled the world whenever he felt like it. He really likes [place] and goes there often Recent Psychosocial Issues: other health issues Psychiatric History: History of anxiety takes medications and feels it is helpful. He feels he is doing well and verbalizes his concerns but optimistic regarding his recovery. Substance Abuse History: No issues  Patient / Family Perceptions, Expectations & Goals Pt/Family understanding of illness & functional limitations: Pt can explain his strokes and deficits he feels he is doing well and recovering and hopes to go home  next week. He does talk with the MD and feels he has a good understanding of his treatment plan while here. Premorbid pt/family roles/activities: Father, brother, friend, retiree,  etc Anticipated changes in roles/activities/participation: resume Pt/family expectations/goals: Pt states: " I hope to be home by next Wednesday when you all have that meeting." " I feel pretty good today."  [employer]: None Premorbid Home Care/DME Agencies: None Transportation available at discharge: self may need to rely upon his children until cleared to drive again Is the patient able to respond to transportation needs?: Yes In the past 12 months, has lack of transportation kept you from medical appointments or from getting medications?: No In the past 12 months, has lack of transportation kept you from meetings, work, or from getting things needed for daily living?: No Resource referrals recommended: Neuropsychology  Discharge Planning Living Arrangements: Alone Support Systems: Children, Other relatives, Friends/neighbors Type of Residence: Private residence Insurance Resources: Media planner (specify) (Health Team Advantage) Financial Resources: Social Security Financial Screen Referred: No Living Expenses: Own Money Management: Patient Does the patient have any problems obtaining your medications?: No Home Management: self Patient/Family Preliminary Plans: Return home with his children assisting with his care. Although he feels this will not be necessary. He feels he will be mod/i by discharge. Aware being evaluated today and goals being set with therapy team today. Care Coordinator Anticipated Follow Up Needs: HH/OP  Clinical Impression Pleasant quite talkative gentleman who is hopeful he will be going home soon at an independent level. He has good family supports-all five children live here in [place]. He has friends also who will check on him. Will await therapy team to evaluate and set goals for his stay here. Feel with history of anxiety would benefit from seeing neuro-psych while here  Lucy Chris 10/07/2022, 11:45 AM

## 2022-10-07 NOTE — Evaluation (Signed)
Occupational Therapy Assessment and Plan  Patient Details  Name: [name] MRN: [id number] Date of Birth: March 01, 1935  OT Diagnosis: abnormal posture, cognitive deficits, disturbance of vision, and muscle weakness (generalized) Rehab Potential: Rehab Potential (ACUTE ONLY): Good ELOS: 7 days   Today's Date: 10/07/2022 OT Individual Time: 4098-1191 OT Individual Time Calculation (min): 75 min     Hospital Problem: Principal Problem:   Ischemic cerebrovascular accident (CVA) (HCC)   Past Medical History:  Past Medical History:  Diagnosis Date   Acute renal failure (HCC)    secondary to nephrotic syndrome post cath, resolved with steroids   Anxiety    CAD (coronary artery disease) 04/2006   LIMA to LAD, sequential SVG to first, second, and third OM's, sequential SVG to mid RCA and PDA    Depression    Diabetes mellitus without complication (HCC)    ED (erectile dysfunction)    HTN (hypertension)    Hx of adenomatous colonic polyps    Hyperlipidemia    Loop recorder: Biotronic Biomonitor II loop 07/28/2021 07/28/2021   Obesity    Past Surgical History:  Past Surgical History:  Procedure Laterality Date   COLONOSCOPY W/ BIOPSIES AND POLYPECTOMY     CORONARY ARTERY BYPASS GRAFT  2008   VESSELS X6   IR CT HEAD LTD  10/02/2022   IR PERCUTANEOUS ART THROMBECTOMY/INFUSION INTRACRANIAL INC DIAG ANGIO  10/02/2022   IR US GUIDE VASC ACCESS RIGHT  10/02/2022   LOOP RECORDER IMPLANT     RADIOLOGY WITH ANESTHESIA N/A 10/02/2022   Procedure: IR WITH ANESTHESIA;  Surgeon: Radiologist, Medication, MD;  Location: [facility] OR;  Service: Radiology;  Laterality: N/A;   UMBILICAL HERNIA REPAIR      Assessment & Plan Clinical Impression: Patient is an  88 year old right-handed male with history of diabetes mellitus, obesity with BMI 25.84. Atrial flutter with history of loop recorder, hypertension, hyperlipidemia, CAD with CABG 2008 maintained on low-dose aspirin. Per chart review patient lives  alone. Two-level home bed bath main level. Independent prior to admission. Presented 10/01/2022 with diplopia and difficulty with balance. Admission chemistries unremarkable except BUN 28 creatinine 1.39, hemoglobin A1c 8.4, urine drug screen negative. Cranial CT scan negative. CTA/CT cerebral perfusion scan showed short segment occlusion of the proximal left P1 segment with additional moderate stenosis in the distal left P2 segment. Severe stenosis left supraclinoid ICA and moderate stenosis of the right supraclinoid ICA. Severe stenosis at the origin of the right PICA. Patient underwent mechanical thrombectomy with complete recanalization per interventional radiology. MRI follow-up revealed scattered small acute infarcts in the bilateral cerebellum as well as left pons and thalamus with minimal occipital lobe restricted diffusion right greater than left.Patient transferred to CIR on 10/06/2022 .    Patient currently requires mod with basic self-care skills secondary to muscle weakness, decreased cardiorespiratoy endurance, decreased coordination, decreased visual acuity and decreased visual motor skills, decreased attention, decreased safety awareness, and decreased memory, and decreased standing balance, decreased postural control, and decreased balance strategies.  Prior to hospitalization, patient could complete BADL with supervision.  Patient will benefit from skilled intervention to decrease level of assist with basic self-care skills and increase independence with basic self-care skills prior to discharge home with care partner.  Anticipate patient will require intermittent supervision and follow up outpatient.  OT - End of Session Activity Tolerance: Tolerates 10 - 20 min activity with multiple rests Endurance Deficit: Yes OT Assessment Rehab Potential (ACUTE ONLY): Good OT Patient demonstrates impairments in the following area(s): Balance;Vision;Behavior;Safety;Endurance;Motor;Cognition  OT Basic  ADL's Functional Problem(s): Bathing;Dressing;Toileting OT Advanced ADL's Functional Problem(s): Simple Meal Preparation OT Transfers Functional Problem(s): Toilet;Tub/Shower OT Plan OT Intensity: Minimum of 1-2 x/day, 45 to 90 minutes OT Frequency: 5 out of 7 days OT Duration/Estimated Length of Stay: 7 days OT Treatment/Interventions: Balance/vestibular training;Disease mangement/prevention;Neuromuscular re-education;Patient/family education;Self Care/advanced ADL retraining;Therapeutic Exercise;UE/LE Coordination activities;Visual/perceptual remediation/compensation;UE/LE Strength taining/ROM;Therapeutic Activities;Functional mobility training;DME/adaptive equipment instruction;Discharge planning;Cognitive remediation/compensation OT Self Feeding Anticipated Outcome(s): Independent OT Basic Self-Care Anticipated Outcome(s): Supervision/ Set up OT Toileting Anticipated Outcome(s): Mod I OT Bathroom Transfers Anticipated Outcome(s): Mod I OT Recommendation Patient destination: Home Follow Up Recommendations: Home health OT Equipment Recommended: To be determined   OT Evaluation Precautions/Restrictions  Precautions Precautions: Fall Restrictions Weight Bearing Restrictions: No Pain Pain Assessment Pain Scale: Faces Pain Score: 0-No pain Faces Pain Scale: No hurt Home Living/Prior Functioning Home Living Family/patient expects to be discharged to:: Private residence Living Arrangements: Alone Available Help at Discharge: Family, Friend(s) Type of Home: House Home Access: Stairs to enter Secretary/administrator of Steps: 2 Entrance Stairs-Rails: None Home Layout: Two level, Full bath on main level, Able to live on main level with bedroom/bathroom Bathroom Shower/Tub: Health visitor: Standard Bathroom Accessibility: Yes  Lives With: Alone IADL History Homemaking Responsibilities: Yes Prior Function Level of Independence: Independent with basic ADLs Driving:  Yes Vocation: Retired Administrator, sports Baseline Vision/History: 1 Wears glasses (Reading glasses) Ability to See in Adequate Light: 0 Adequate Patient Visual Report: Peripheral vision impairment (reports vision is resolving) Vision Assessment?: Vision impaired- to be further tested in functional context Eye Alignment: Within Functional Limits Ocular Range of Motion: Within Functional Limits Alignment/Gaze Preference: Within Defined Limits Perception  Perception: Within Functional Limits Praxis Praxis: Intact Cognition Cognition Overall Cognitive Status: Within Functional Limits for tasks assessed Arousal/Alertness: Awake/alert Memory: Appears intact Attention: Sustained;Selective Sustained Attention: Impaired Sustained Attention Impairment: Verbal complex Selective Attention: Impaired Selective Attention Impairment: Verbal complex Awareness: Appears intact Awareness Impairment: Intellectual impairment Problem Solving: Appears intact Problem Solving Impairment: Verbal complex Executive Function: Sequencing;Self Monitoring;Organizing Sequencing: Appears intact Sequencing Impairment: Functional basic Organizing: Appears intact Organizing Impairment: Functional basic Self Monitoring: Impaired Self Monitoring Impairment: Functional basic;Functional complex Behaviors: Other (comment) (moves quickly at times, very talkative - self distracting) Safety/Judgment: Appears intact Brief Interview for Mental Status (BIMS) Repetition of Three Words (First Attempt): 3 Temporal Orientation: Year: Correct Temporal Orientation: Month: Accurate within 5 days Temporal Orientation: Day: Correct Recall: "Sock": Yes, no cue required Recall: "Blue": Yes, no cue required Recall: "Bed": No, could not recall BIMS Summary Score: 13 Sensation Sensation Light Touch: Appears Intact Hot/Cold: Appears Intact Proprioception: Appears Intact Stereognosis: Not tested Coordination Gross Motor Movements are Fluid  and Coordinated: No Fine Motor Movements are Fluid and Coordinated: No Finger Nose Finger Test: mild dysmetria Motor  Motor Motor: Abnormal postural alignment and control Motor - Skilled Clinical Observations: forward head, flexed trunk, posterior pelvis - mild  Trunk/Postural Assessment  Cervical Assessment Cervical Assessment: Within Functional Limits Thoracic Assessment Thoracic Assessment: Within Functional Limits Lumbar Assessment Lumbar Assessment: Within Functional Limits Postural Control Postural Control: Deficits on evaluation Righting Reactions: delayed - slight posterior bias  Balance Balance Balance Assessed: Yes Static Sitting Balance Static Sitting - Balance Support: No upper extremity supported Static Sitting - Level of Assistance: 7: Independent Dynamic Sitting Balance Dynamic Sitting - Balance Support: No upper extremity supported;During functional activity Dynamic Sitting - Level of Assistance: 5: Stand by assistance Static Standing Balance Static Standing - Balance Support: Right upper extremity supported;Left upper extremity supported;During  functional activity Static Standing - Level of Assistance: 4: Min assist Dynamic Standing Balance Dynamic Standing - Balance Support: Right upper extremity supported;Left upper extremity supported;During functional activity Dynamic Standing - Level of Assistance: 4: Min assist Extremity/Trunk Assessment RUE Assessment RUE Assessment: Within Functional Limits Active Range of Motion (AROM) Comments: WFL General Strength Comments: 4-/5 LUE Assessment Active Range of Motion (AROM) Comments: WFL General Strength Comments: 4-/5  Care Tool Care Tool Self Care Eating Eating activity did not occur: Refused      Oral Care    Oral Care Assist Level: Independent with assistive device Assistive Device Comment: RW  Bathing   Body parts bathed by patient: Right arm;Left arm;Chest;Abdomen;Front perineal area;Buttocks;Right  upper leg;Face;Left upper leg Body parts bathed by helper: Right lower leg;Left lower leg   Assist Level: Minimal Assistance - Patient > 75%    Upper Body Dressing(including orthotics)   What is the patient wearing?: Pull over shirt   Assist Level: Set up assist    Lower Body Dressing (excluding footwear)   What is the patient wearing?: Incontinence brief;Pants Assist for lower body dressing: Moderate Assistance - Patient 50 - 74%    Putting on/Taking off footwear   What is the patient wearing?: Shoes Assist for footwear: Minimal Assistance - Patient > 75%       Care Tool Toileting Toileting activity Toileting Activity did not occur (Clothing management and hygiene only): N/A (no void or bm)       Care Tool Bed Mobility Roll left and right activity   Roll left and right assist level: Contact Guard/Touching assist    Sit to lying activity   Sit to lying assist level: Contact Guard/Touching assist    Lying to sitting on side of bed activity   Lying to sitting on side of bed assist level: the ability to move from lying on the back to sitting on the side of the bed with no back support.: Contact Guard/Touching assist     Care Tool Transfers Sit to stand transfer   Sit to stand assist level: Minimal Assistance - Patient > 75%    Chair/bed transfer   Chair/bed transfer assist level: Minimal Assistance - Patient > 75%     Toilet transfer   Assist Level: Minimal Assistance - Patient > 75%     Care Tool Cognition  Expression of Ideas and Wants Expression of Ideas and Wants: 4. Without difficulty (complex and basic) - expresses complex messages without difficulty and with speech that is clear and easy to understand  Understanding Verbal and Non-Verbal Content Understanding Verbal and Non-Verbal Content: 4. Understands (complex and basic) - clear comprehension without cues or repetitions   Memory/Recall Ability Memory/Recall Ability : Current season;That he or she is in a  hospital/hospital unit   Refer to Care Plan for Long Term Goals  SHORT TERM GOAL WEEK 1 OT Short Term Goal 1 (Week 1): STG = LTG due to LOS  Recommendations for other services: None    Skilled Therapeutic Intervention:   Patient able to walk to shower, condom catheter removed.  BADL status as indicated below.  Patient unable to articulate reason for hospitalization initially, but later in session very clear as to resultant deficits.  Patient with decreased standing balance static and dynamic - stands with wide base of support, and tends to lean to surface for stability.  Patient showered and dressed, returned to recliner with safety belt in place and engaged, call bell and personal items in reach.  ADL  ADL Eating: Unable to assess Grooming: Supervision/safety Where Assessed-Grooming: Standing at sink Upper Body Bathing: Supervision/safety Where Assessed-Upper Body Bathing: Shower Lower Body Bathing: Minimal assistance Where Assessed-Lower Body Bathing: Shower Upper Body Dressing: Supervision/safety Where Assessed-Upper Body Dressing: Chair Lower Body Dressing: Moderate assistance Where Assessed-Lower Body Dressing: Chair Toileting: Unable to assess Toilet Transfer: Unable to assess Tub/Shower Transfer: Unable to assess Tub/Shower Transfer Method: Unable to assess Film/video editor: Minimal assistance Film/video editor Method: Designer, industrial/product: Event organiser  Bed Mobility Bed Mobility: Rolling Left;Left Sidelying to Sit Rolling Left: Contact Guard/Touching assist Left Sidelying to Sit: Contact Guard/Touching assist Transfers Sit to Stand: Minimal Assistance - Patient > 75% Stand to Sit: Minimal Assistance - Patient > 75%   Discharge Criteria: Patient will be discharged from OT if patient refuses treatment 3 consecutive times without medical reason, if treatment goals not met, if there is a change in medical status, if patient makes  no progress towards goals or if patient is discharged from hospital.  The above assessment, treatment plan, treatment alternatives and goals were discussed and mutually agreed upon: by patient  [name] 10/07/2022, 1:31 PM

## 2022-10-07 NOTE — Progress Notes (Signed)
Inpatient Rehabilitation  Patient information reviewed and entered into eRehab system by Akaylah Lalley M. Ashni Lonzo, M.A., CCC/SLP, PPS Coordinator.  Information including medical coding, functional ability and quality indicators will be reviewed and updated through discharge.    

## 2022-10-07 NOTE — Progress Notes (Signed)
[facility] Individual Statement of Services  Patient Name:  [name]  Date:  10/07/2022  Welcome to the [facility].  Our goal is to provide you with an individualized program based on your diagnosis and situation, designed to meet your specific needs.  With this comprehensive rehabilitation program, you will be expected to participate in at least 3 hours of rehabilitation therapies Monday-Friday, with modified therapy programming on the weekends.  Your rehabilitation program will include the following services:  Physical Therapy (PT), Occupational Therapy (OT), Speech Therapy (ST), 24 hour per day rehabilitation nursing, Therapeutic Recreaction (TR), Neuropsychology, Care Coordinator, Rehabilitation Medicine, Nutrition Services, and Pharmacy Services  Weekly team conferences will be held on Wednesday to discuss your progress.  Your Inpatient Rehabilitation Care Coordinator will talk with you frequently to get your input and to update you on team discussions.  Team conferences with you and your family in attendance may also be held.  Expected length of stay: 7 days  Overall anticipated outcome: supervision-mod/I level  Depending on your progress and recovery, your program may change. Your Inpatient Rehabilitation Care Coordinator will coordinate services and will keep you informed of any changes. Your Inpatient Rehabilitation Care Coordinator's name and contact numbers are listed  below.  The following services may also be recommended but are not provided by the Inpatient [facility]:  Driving Evaluations Home Health Rehabiltiation Services Outpatient Rehabilitation Services    Arrangements will be made to provide these services after discharge if needed.  Arrangements include referral to agencies that provide these services.  Your insurance has been verified to be:  [employer] Your primary doctor is:  Martha Clan  Pertinent information will be shared with your doctor and your insurance company.  Inpatient Rehabilitation Care Coordinator:  Dossie Der, [place] [phone] or Luna [place]  Information discussed with and copy given to patient by: Lucy Chris, 10/07/2022, 11:47 AM

## 2022-10-07 NOTE — Plan of Care (Signed)
  Problem: RH Bathing Goal: LTG Patient will bathe all body parts with assist levels (OT) Description: LTG: Patient will bathe all body parts with assist levels (OT) Flowsheets (Taken 10/07/2022 1341) LTG: Pt will perform bathing with assistance level/cueing: Independent with assistive device  LTG: Position pt will perform bathing: Shower   Problem: RH Dressing Goal: LTG Patient will perform lower body dressing w/assist (OT) Description: LTG: Patient will perform lower body dressing with assist, with/without cues in positioning using equipment (OT) Flowsheets (Taken 10/07/2022 1341) LTG: Pt will perform lower body dressing with assistance level of:  Supervision/Verbal cueing  Set up assist   Problem: RH Toileting Goal: LTG Patient will perform toileting task (3/3 steps) with assistance level (OT) Description: LTG: Patient will perform toileting task (3/3 steps) with assistance level (OT)  Flowsheets (Taken 10/07/2022 1341) LTG: Pt will perform toileting task (3/3 steps) with assistance level: Independent with assistive device   Problem: RH Vision Goal: RH LTG Vision Consulting civil engineer) Flowsheets (Taken 10/07/2022 1341) LTG: Vision Goals: Patient will demonstrate compensatory strategies to address visual motor, or visual acuity deficits without cueing   Problem: RH Simple Meal Prep Goal: LTG Patient will perform simple meal prep w/assist (OT) Description: LTG: Patient will perform simple meal prep with assistance, with/without cues (OT). Flowsheets (Taken 10/07/2022 1341) LTG: Pt will perform simple meal prep with assistance level of: Supervision/Verbal cueing LTG: Pt will perform simple meal prep w/level of: Ambulate with device   Problem: RH Toilet Transfers Goal: LTG Patient will perform toilet transfers w/assist (OT) Description: LTG: Patient will perform toilet transfers with assist, with/without cues using equipment (OT) Flowsheets (Taken 10/07/2022 1341) LTG: Pt will perform toilet  transfers with assistance level of: Independent with assistive device   Problem: RH Tub/Shower Transfers Goal: LTG Patient will perform tub/shower transfers w/assist (OT) Description: LTG: Patient will perform tub/shower transfers with assist, with/without cues using equipment (OT) Flowsheets (Taken 10/07/2022 1341) LTG: Pt will perform tub/shower stall transfers with assistance level of: Independent with assistive device LTG: Pt will perform tub/shower transfers from: Walk in shower   Problem: RH Attention Goal: LTG Patient will demonstrate this level of attention during functional activites (OT) Description: LTG:  Patient will demonstrate this level of attention during functional activites  (OT) Flowsheets (Taken 10/07/2022 1341) Patient will demonstrate this level of attention during functional activites: Alternating Patient will demonstrate above attention level in the following environment: Home LTG: Patient will demonstrate this level of attention during functional activites (OT): Supervision   Problem: RH Awareness Goal: LTG: Patient will demonstrate awareness during functional activites type of (OT) Description: LTG: Patient will demonstrate awareness during functional activites type of (OT) Flowsheets (Taken 10/07/2022 1341) Patient will demonstrate awareness during functional activites type of: Emergent LTG: Patient will demonstrate awareness during functional activites type of (OT): Supervision

## 2022-10-07 NOTE — Progress Notes (Signed)
PROGRESS NOTE   Subjective/Complaints: Pt had a reasonable night. Right big toe a little sore (ingrown nail) but otherwise feels well.   ROS: Patient denies fever, rash, sore throat, blurred vision, dizziness, nausea, vomiting, diarrhea, cough, shortness of breath or chest pain, joint or back/neck pain, headache, or mood change.    Objective:   No results found. Recent Labs    10/07/22 0758  WBC 7.7  HGB 13.2  HCT 38.0*  PLT 180   Recent Labs    10/06/22 0347 10/07/22 0758  NA 137 138  K 3.5 3.8  CL 103 100  CO2 26 27  GLUCOSE 134* 158*  BUN 23 23  CREATININE 1.25* 1.34*  CALCIUM 9.2 9.3    Intake/Output Summary (Last 24 hours) at 10/07/2022 1031 Last data filed at 10/07/2022 8295 Gross per 24 hour  Intake 480 ml  Output 100 ml  Net 380 ml        Physical Exam: Vital Signs Blood pressure 122/61, pulse 75, temperature 98 F (36.7 C), resp. rate 18, weight 79.4 kg, SpO2 97 %.  General: Alert and oriented x 3, No apparent distress HEENT: Head is normocephalic, atraumatic, PERRLA, EOMI, sclera anicteric, oral mucosa pink and moist, dentition intact, ext ear canals clear,  Neck: Supple without JVD or lymphadenopathy Heart: Reg rate and rhythm. No murmurs rubs or gallops Chest: CTA bilaterally without wheezes, rales, or rhonchi; no distress Abdomen: Soft, non-tender, non-distended, bowel sounds positive. Extremities: No clubbing, cyanosis, or edema. Pulses are 2+ Psych: Pt's affect is appropriate. Pt is cooperative Skin: Clean and intact without signs of breakdown. Mild ingrown medial edge of great toe nail. No drainage or swelling, minimal redness.  Neuro:  Alert and oriented x 3. Normal insight and awareness. Intact Memory. Normal language and speech. Cranial nerve exam unremarkable except for mild left C7. MMT: RUE 4+ to 5/5 prox to distal. RLE 4+ prox to 5/5 distal. LUE 4- prox to 4+ distally. LLE 4+ to  5-/5 prox to distal.   Musculoskeletal: Full ROM, No pain with AROM or PROM in the neck, trunk, or extremities. Posture appropriate     Assessment/Plan: 1. Functional deficits which require 3+ hours per day of interdisciplinary therapy in a comprehensive inpatient rehab setting. Physiatrist is providing close team supervision and 24 hour management of active medical problems listed below. Physiatrist and rehab team continue to assess barriers to discharge/monitor patient progress toward functional and medical goals  Care Tool:  Bathing              Bathing assist       Upper Body Dressing/Undressing Upper body dressing        Upper body assist      Lower Body Dressing/Undressing Lower body dressing            Lower body assist       Toileting Toileting    Toileting assist       Transfers Chair/bed transfer  Transfers assist           Locomotion Ambulation   Ambulation assist              Walk 10 feet activity  Assist           Walk 50 feet activity   Assist           Walk 150 feet activity   Assist           Walk 10 feet on uneven surface  activity   Assist           Wheelchair     Assist               Wheelchair 50 feet with 2 turns activity    Assist            Wheelchair 150 feet activity     Assist          Blood pressure 122/61, pulse 75, temperature 98 F (36.7 C), resp. rate 18, weight 79.4 kg, SpO2 97 %.  Medical Problem List and Plan: 1. 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  Antithrombotics: -DVT/anticoagulation:  Pharmaceutical: Eliquis             -antiplatelet therapy: Plavix 75 mg daily 3. Pain Management: Tylenol as  needed 4. Mood/Behavior/Sleep: Provide emotional support             -antipsychotic agents: N/A 5. Neuropsych/cognition: This patient is capable of making decisions on his own behalf. 6. Skin/Wound Care: Routine skin checks  -no treatment necessary for ingrown nail--can address as outpt if needed 7. Fluids/Electrolytes/Nutrition: Routine in and outs with follow-up chemistries 8.  Diabetes mellitus.  Hemoglobin A1c 8.4.  Presently on Semglee 5 units nightly.  Check blood sugars before meals and at bedtime.  Patient on  Glucophage 1000 mg twice daily prior to admission as well as Ozempic and Jardiance 25 mg daily.  CBG (last 3)  Recent Labs    10/06/22 1633 10/06/22 2128 10/07/22 0630  GLUCAP 164* 200* 119*    -6/21 resume jardiance. Dc semglee 9.  Hypertension.Presently on Sectral 200 mg daily, Cialis 5 mg daily., and on Benicar 40-25 mg daily--drop hydrochlorothiazide component given #10.     -6/21 bp well controlled 10.  AKI.  I suspect he might have a chronic component as well  -6/21 slight increase of Cr to 1.34 today   -encourage fluids   -dc hydrochlorothiazide   -recheck labs Monday 11.  Atrial fibrillation/CAD/CABG.  Status post loop recorder.  Followed by cardiology services.  Cardiac rate controlled.  Continue Eliquis 12.  Hyperlipidemia.  Zetia/Lipitor 13.  BPH.  Flomax 0.4 mg daily, Myrbetriq 50 mg daily  -remove external cath--up to toile to void.  14.  Obesity.  BMI 25.84.  Dietary follow-up    LOS: 1 days A FACE TO FACE EVALUATION WAS PERFORMED  Ranelle Oyster 10/07/2022, 10:31 AM

## 2022-10-07 NOTE — Discharge Instructions (Signed)
Inpatient Rehab Discharge Instructions  [name] Discharge date and time: No discharge date for patient encounter.   Activities/Precautions/ Functional Status: Activity: activity as tolerated Diet: diabetic diet/encourage fluids Wound Care: Routine skin checks Functional status:  ___ No restrictions     ___ Walk up steps independently ___ 24/7 supervision/assistance   ___ Walk up steps with assistance ___ Intermittent supervision/assistance  ___ Bathe/dress independently ___ Walk with walker     _x__ Bathe/dress with assistance ___ Walk Independently    ___ Shower independently ___ Walk with assistance    ___ Shower with assistance ___ No alcohol     ___ Return to work/school ________  Special Instructions: No driving smoking or alcohol  Follow-up chemistries with PCP to monitor renal function  Resume SAXAGLIPTIN/METFORMIN 5-1000mg  daily     COMMUNITY REFERRALS UPON DISCHARGE:  Outpatient: [facility]             Agency:[facility]  [address]   Phone:[phone]              Appointment Date/Time:WILL CALL TO SET UP FOLLOW UP APPOINTMENTS  Medical Equipment/Items Ordered:ROLLING WALKER                                                 Agency/Supplier:[facility]   [phone]    No driving smoking or alcoholSTROKE/TIA DISCHARGE INSTRUCTIONS SMOKING Cigarette smoking nearly doubles your risk of having a stroke & is the single most alterable risk factor  If you smoke or have smoked in the last 12 months, you are advised to quit smoking for your health. Most of the excess cardiovascular risk related to smoking disappears within a year of stopping. Ask you doctor about anti-smoking medications [place] Quit Line: 1-800-QUIT NOW Free Smoking Cessation Classes [phone]  CHOLESTEROL Know your levels; limit fat & cholesterol in your diet  Lipid Panel     Component Value Date/Time   CHOL 136 10/02/2022 0227    TRIG 68 10/02/2022 0227   HDL 45 10/02/2022 0227   CHOLHDL 3.0 10/02/2022 0227   VLDL 14 10/02/2022 0227   LDLCALC 77 10/02/2022 0227     Many patients benefit from treatment even if their cholesterol is at goal. Goal: Total Cholesterol (CHOL) less than 160 Goal:  Triglycerides (TRIG) less than 150 Goal:  HDL greater than 40 Goal:  LDL (LDLCALC) less than 100   BLOOD PRESSURE American Stroke Association blood pressure target is less that 120/80 mm/Hg  Your discharge blood pressure is:  BP: (!) 151/84 Monitor your blood pressure Limit your salt and alcohol intake Many individuals will require more than one medication for high blood pressure  DIABETES (A1c is a blood sugar average for last 3 months) Goal HGBA1c is under 7% (HBGA1c is blood sugar average for last 3 months)  Diabetes:   Lab Results  Component Value Date   HGBA1C 8.4 (H) 10/02/2022    Your HGBA1c can be lowered with medications, healthy diet, and exercise. Check your blood sugar as directed by your physician Call your physician if you experience unexplained or low blood sugars.  PHYSICAL ACTIVITY/REHABILITATION Goal is 30 minutes at least 4 days per week  Activity: Increase activity slowly, Therapies: Physical Therapy: Home Health Return to work:  Activity decreases your risk of heart attack and stroke and makes your heart  stronger.  It helps control your weight and blood pressure; helps you relax and can improve your mood. Participate in a regular exercise program. Talk with your doctor about the best form of exercise for you (dancing, walking, swimming, cycling).  DIET/WEIGHT Goal is to maintain a healthy weight  Your discharge diet is:  Diet Order             Diet Carb Modified Fluid consistency: Thin; Room service appropriate? Yes  Diet effective now                   liquids Your height is:    Your current weight is: Weight: 79.4 kg Your Body Mass Index (BMI) is:    Following the type of diet  specifically designed for you will help prevent another stroke. Your goal weight range is:   Your goal Body Mass Index (BMI) is 19-24. Healthy food habits can help reduce 3 risk factors for stroke:  High cholesterol, hypertension, and excess weight.  RESOURCES Stroke/Support Group:  Call [phone]   STROKE EDUCATION PROVIDED/REVIEWED AND GIVEN TO PATIENT Stroke warning signs and symptoms How to activate emergency medical system (call 911). Medications prescribed at discharge. Need for follow-up after discharge. Personal risk factors for stroke. Pneumonia vaccine given: No Flu vaccine given: No My questions have been answered, the writing is legible, and I understand these instructions.  I will adhere to these goals & educational materials that have been provided to me after my discharge from the hospital.     My questions have been answered and I understand these instructions. I will adhere to these goals and the provided educational materials after my discharge from the hospital.  Patient/Caregiver Signature _______________________________ Date __________  Clinician Signature _______________________________________ Date __________  Please bring this form and your medication list with you to all your follow-up doctor's appointments.     Information on my medicine - ELIQUIS (apixaban)  This medication education was reviewed with me or my healthcare representative as part of my discharge preparation.    Why was Eliquis prescribed for you? Eliquis was prescribed for you to reduce the risk of a blood clot forming that can cause a stroke if you have a medical condition called atrial fibrillation (a type of irregular heartbeat).  What do You need to know about Eliquis ? Take your Eliquis TWICE DAILY - one tablet in the morning and one tablet in the evening with or without food. If you have difficulty swallowing the tablet whole please discuss with your pharmacist how to take the  medication safely.  Take Eliquis exactly as prescribed by your doctor and DO NOT stop taking Eliquis without talking to the doctor who prescribed the medication.  Stopping may increase your risk of developing a stroke.  Refill your prescription before you run out.  After discharge, you should have regular check-up appointments with your healthcare provider that is prescribing your Eliquis.  In the future your dose may need to be changed if your kidney function or weight changes by a significant amount or as you get older.  What do you do if you miss a dose? If you miss a dose, take it as soon as you remember on the same day and resume taking twice daily.  Do not take more than one dose of ELIQUIS at the same time to make up a missed dose.  Important Safety Information A possible side effect of Eliquis is bleeding. You should call your healthcare provider right away if  you experience any of the following: Bleeding from an injury or your nose that does not stop. Unusual colored urine (red or dark brown) or unusual colored stools (red or black). Unusual bruising for unknown reasons. A serious fall or if you hit your head (even if there is no bleeding).  Some medicines may interact with Eliquis and might increase your risk of bleeding or clotting while on Eliquis. To help avoid this, consult your healthcare provider or pharmacist prior to using any new prescription or non-prescription medications, including herbals, vitamins, non-steroidal anti-inflammatory drugs (NSAIDs) and supplements.  This website has more information on Eliquis (apixaban): http://[email or web address].com/eliquis/home

## 2022-10-07 NOTE — Plan of Care (Signed)
  Problem: RH Balance Goal: LTG Patient will maintain dynamic standing balance (PT) Description: LTG:  Patient will maintain dynamic standing balance with assistance during mobility activities (PT) Flowsheets (Taken 10/07/2022 1443) LTG: Pt will maintain dynamic standing balance during mobility activities with:: Supervision/Verbal cueing   Problem: Sit to Stand Goal: LTG:  Patient will perform sit to stand with assistance level (PT) Description: LTG:  Patient will perform sit to stand with assistance level (PT) Flowsheets (Taken 10/07/2022 1443) LTG: PT will perform sit to stand in preparation for functional mobility with assistance level: Independent with assistive device   Problem: RH Bed Mobility Goal: LTG Patient will perform bed mobility with assist (PT) Description: LTG: Patient will perform bed mobility with assistance, with/without cues (PT). Flowsheets (Taken 10/07/2022 1443) LTG: Pt will perform bed mobility with assistance level of: Independent with assistive device    Problem: RH Bed to Chair Transfers Goal: LTG Patient will perform bed/chair transfers w/assist (PT) Description: LTG: Patient will perform bed to chair transfers with assistance (PT). Flowsheets (Taken 10/07/2022 1443) LTG: Pt will perform Bed to Chair Transfers with assistance level: Independent with assistive device    Problem: RH Car Transfers Goal: LTG Patient will perform car transfers with assist (PT) Description: LTG: Patient will perform car transfers with assistance (PT). Flowsheets (Taken 10/07/2022 1443) LTG: Pt will perform car transfers with assist:: Set up assist    Problem: RH Ambulation Goal: LTG Patient will ambulate in controlled environment (PT) Description: LTG: Patient will ambulate in a controlled environment, # of feet with assistance (PT). Flowsheets (Taken 10/07/2022 1443) LTG: Pt will ambulate in controlled environ  assist needed:: Supervision/Verbal cueing LTG: Ambulation distance in  controlled environment: 150' Goal: LTG Patient will ambulate in home environment (PT) Description: LTG: Patient will ambulate in home environment, # of feet with assistance (PT). Flowsheets (Taken 10/07/2022 1443) LTG: Pt will ambulate in home environ  assist needed:: Supervision/Verbal cueing LTG: Ambulation distance in home environment: 50'   Problem: RH Stairs Goal: LTG Patient will ambulate up and down stairs w/assist (PT) Description: LTG: Patient will ambulate up and down # of stairs with assistance (PT) Flowsheets (Taken 10/07/2022 1443) LTG: Pt will ambulate up/down stairs assist needed:: Supervision/Verbal cueing LTG: Pt will  ambulate up and down number of stairs: at least 4 with 2 hand rails or as per home setup

## 2022-10-07 NOTE — Evaluation (Signed)
Physical Therapy Assessment and Plan  Patient Details  Name: [name] MRN: [id number] Date of Birth: February 25, 1935  PT Diagnosis: Abnormal posture, Abnormality of gait, Difficulty walking, and Muscle weakness Rehab Potential: Good ELOS: 1 week   Today's Date: 10/07/2022 PT Individual Time: 1330-1440 PT Individual Time Calculation (min): 70 min    Hospital Problem: Principal Problem:   Ischemic cerebrovascular accident (CVA) [facility])   Past Medical History:  Past Medical History:  Diagnosis Date   Acute renal failure (HCC)    secondary to nephrotic syndrome post cath, resolved with steroids   Anxiety    CAD (coronary artery disease) 04/2006   LIMA to LAD, sequential SVG to first, second, and third OM's, sequential SVG to mid RCA and PDA    Depression    Diabetes mellitus without complication (HCC)    ED (erectile dysfunction)    HTN (hypertension)    Hx of adenomatous colonic polyps    Hyperlipidemia    Loop recorder: Biotronic Biomonitor II loop 07/28/2021 07/28/2021   Obesity    Past Surgical History:  Past Surgical History:  Procedure Laterality Date   COLONOSCOPY W/ BIOPSIES AND POLYPECTOMY     CORONARY ARTERY BYPASS GRAFT  2008   VESSELS X6   IR CT HEAD LTD  10/02/2022   IR PERCUTANEOUS ART THROMBECTOMY/INFUSION INTRACRANIAL INC DIAG ANGIO  10/02/2022   IR US GUIDE VASC ACCESS RIGHT  10/02/2022   LOOP RECORDER IMPLANT     RADIOLOGY WITH ANESTHESIA N/A 10/02/2022   Procedure: IR WITH ANESTHESIA;  Surgeon: Radiologist, Medication, MD;  Location: [facility] OR;  Service: Radiology;  Laterality: N/A;   UMBILICAL HERNIA REPAIR      Assessment & Plan Clinical Impression: Patient is a 88 year old right-handed male with history of diabetes mellitus, obesity with BMI 25.84. Atrial flutter with history of loop recorder, hypertension, hyperlipidemia, CAD with CABG 2008 maintained on low-dose aspirin. Per chart review patient lives alone. Two-level home bed bath main level. Independent  prior to admission. Presented 10/01/2022 with diplopia and difficulty with balance. Admission chemistries unremarkable except BUN 28 creatinine 1.39, hemoglobin A1c 8.4, urine drug screen negative. Cranial CT scan negative. CTA/CT cerebral perfusion scan showed short segment occlusion of the proximal left P1 segment with additional moderate stenosis in the distal left P2 segment. Severe stenosis left supraclinoid ICA and moderate stenosis of the right supraclinoid ICA. Severe stenosis at the origin of the right PICA. Patient underwent mechanical thrombectomy with complete recanalization per interventional radiology. MRI follow-up revealed scattered small acute infarcts in the bilateral cerebellum as well as left pons and thalamus with minimal occipital lobe restricted diffusion right greater than left. Echo with ejection fraction of 60 to 65% no wall motion abnormalities. Neurology follow-up currently maintained on Eliquis as well as Plavix after discussed with neurology services as well as cardiology Dr. Jacinto Halim. Tolerating a regular consistency diet. Therapy evaluations completed due to patient decreased functional mobility was admitted for a comprehensive rehab program. Patient transferred to CIR on 10/06/2022 .   Patient currently requires min with mobility secondary to muscle weakness and muscle joint tightness, decreased cardiorespiratoy endurance, and decreased standing balance.  Prior to hospitalization, patient was independent  with mobility and lived with Alone in a House home.  Home access is 2Stairs to enter.  Patient will benefit from skilled PT intervention to maximize safe functional mobility, minimize fall risk, and decrease caregiver burden for planned discharge home with 24 hour supervision.  Anticipate patient will benefit from follow up HH at  discharge.  PT - End of Session Activity Tolerance: Tolerates 10 - 20 min activity with multiple rests Endurance Deficit: Yes PT Assessment Rehab  Potential (ACUTE/IP ONLY): Good PT Barriers to Discharge: Decreased caregiver support;Lack of/limited family support PT Patient demonstrates impairments in the following area(s): Balance;Endurance;Motor;Safety PT Transfers Functional Problem(s): Bed Mobility;Bed to Chair;Car;Floor PT Locomotion Functional Problem(s): Stairs;Ambulation PT Plan PT Intensity: Minimum of 1-2 x/day ,45 to 90 minutes PT Frequency: 5 out of 7 days PT Duration Estimated Length of Stay: 1 week PT Treatment/Interventions: Ambulation/gait training;Discharge planning;Functional mobility training;Therapeutic Activities;Psychosocial support;Visual/perceptual remediation/compensation;Therapeutic Exercise;Wheelchair propulsion/positioning;Skin care/wound management;Neuromuscular re-education;Disease management/prevention;Balance/vestibular training;Cognitive remediation/compensation;DME/adaptive equipment instruction;UE/LE Strength taining/ROM;Splinting/orthotics;Pain management;Community reintegration;Functional electrical stimulation;Patient/family education;Stair training;UE/LE Coordination activities PT Transfers Anticipated Outcome(s): Mod I PT Locomotion Anticipated Outcome(s): Supervision PT Recommendation Recommendations for Other Services: Neuropsych consult Follow Up Recommendations: Outpatient PT;24 hour supervision/assistance Patient destination: Home Equipment Recommended: To be determined   PT Evaluation Precautions/Restrictions Precautions Precautions: Fall Restrictions Weight Bearing Restrictions: No Pain Pain Assessment Pain Scale: Faces Pain Score: 0-No pain Faces Pain Scale: No hurt Pain Interference Pain Interference Pain Effect on Sleep: 0. Does not apply - I have not had any pain or hurting in the past 5 days Pain Interference with Therapy Activities: 0. Does not apply - I have not received rehabilitationtherapy in the past 5 days Pain Interference with Day-to-Day Activities: 1. Rarely or not  at all Home Living/Prior Functioning Home Living Living Arrangements: Alone Available Help at Discharge: Family;Friend(s) Type of Home: House Home Access: Stairs to enter [employer] of Steps: 2 Entrance Stairs-Rails: Can reach both Home Layout: Two level;Full bath on main level;Able to live on main level with bedroom/bathroom Bathroom Shower/Tub: Health visitor: Standard Bathroom Accessibility: Yes  Lives With: Alone Prior Function Level of Independence: Independent with basic ADLs;Independent with gait;Independent with transfers  Able to Take Stairs?: Yes Driving: Yes Vocation: Retired Vision/Perception  Vision - History Ability to See in Adequate Light: 0 Adequate Vision - Assessment Eye Alignment: Within Functional Limits Ocular Range of Motion: Within Functional Limits Alignment/Gaze Preference: Within Defined Limits Perception Perception: Within Functional Limits Praxis Praxis: Intact  Cognition Overall Cognitive Status: Within Functional Limits for tasks assessed Arousal/Alertness: Awake/alert Orientation Level: Oriented X4 Year: 2024 Month: June Day of Week: Correct Attention: Sustained;Selective;Focused Focused Attention: Appears intact Sustained Attention: Impaired Sustained Attention Impairment: Verbal complex;Functional complex Selective Attention: Impaired Selective Attention Impairment: Verbal complex;Functional complex Memory: Appears intact Awareness: Impaired Awareness Impairment: Anticipatory impairment Problem Solving: Appears intact Problem Solving Impairment: Verbal complex Executive Function: Sequencing;Self Monitoring;Organizing Sequencing: Appears intact Sequencing Impairment: Functional basic Organizing: Appears intact Organizing Impairment: Functional basic Self Monitoring: Impaired Self Monitoring Impairment: Functional basic;Functional complex Behaviors: Other (comment) (moves quickly at times, very  talkative - self distracting) Safety/Judgment: Appears intact Sensation Sensation Light Touch: Appears Intact Hot/Cold: Appears Intact Proprioception: Appears Intact Stereognosis: Not tested Coordination Gross Motor Movements are Fluid and Coordinated: No Fine Motor Movements are Fluid and Coordinated: No Finger Nose Finger Test: mild dysmetria Motor  Motor Motor: Abnormal postural alignment and control Motor - Skilled Clinical Observations: forward head, flexed trunk, posterior pelvis - mild   Trunk/Postural Assessment  Cervical Assessment Cervical Assessment: Within Functional Limits Thoracic Assessment Thoracic Assessment: Within Functional Limits Lumbar Assessment Lumbar Assessment: Within Functional Limits Postural Control Postural Control: Deficits on evaluation Righting Reactions: delayed - slight posterior bias  Balance Balance Balance Assessed: Yes Standardized Balance Assessment Standardized Balance Assessment: Berg Balance Test Berg Balance Test Sit to Stand: Able to stand using hands after  several tries Standing Unsupported: Able to stand 2 minutes with supervision Sitting with Back Unsupported but Feet Supported on Floor or Stool: Able to sit safely and securely 2 minutes Stand to Sit: Uses backs of legs against chair to control descent Transfers: Needs one person to assist Standing Unsupported with Eyes Closed: Able to stand 10 seconds with supervision Standing Ubsupported with Feet Together: Needs help to attain position but able to stand for 30 seconds with feet together From Standing, Reach Forward with Outstretched Arm: Reaches forward but needs supervision From Standing Position, Pick up Object from Floor: Able to pick up shoe, needs supervision From Standing Position, Turn to Look Behind Over each Shoulder: Turn sideways only but maintains balance Turn 360 Degrees: Needs close supervision or verbal cueing Standing Unsupported, Alternately Place Feet on  Step/Stool: Able to complete >2 steps/needs minimal assist Standing Unsupported, One Foot in Front: Able to take small step independently and hold 30 seconds Standing on One Leg: Unable to try or needs assist to prevent fall Total Score: 26 Static Sitting Balance Static Sitting - Balance Support: No upper extremity supported Static Sitting - Level of Assistance: 7: Independent Dynamic Sitting Balance Dynamic Sitting - Balance Support: No upper extremity supported;During functional activity Dynamic Sitting - Level of Assistance: 5: Stand by assistance Static Standing Balance Static Standing - Balance Support: No upper extremity supported Static Standing - Level of Assistance: 5: Stand by assistance Dynamic Standing Balance Dynamic Standing - Balance Support: No upper extremity supported;During functional activity Dynamic Standing - Level of Assistance: 4: Min assist Extremity Assessment  RUE Assessment RUE Assessment: Within Functional Limits Active Range of Motion (AROM) Comments: WFL General Strength Comments: 4-/5 LUE Assessment Active Range of Motion (AROM) Comments: WFL General Strength Comments: 4-/5 RLE Assessment RLE Assessment: Exceptions to [facility] General Strength Comments: Grossly 4/5 LLE Assessment LLE Assessment: Exceptions to [facility] General Strength Comments: Grossly 4/5  Care Tool Care Tool Bed Mobility Roll left and right activity   Roll left and right assist level: Contact Guard/Touching assist    Sit to lying activity   Sit to lying assist level: Contact Guard/Touching assist    Lying to sitting on side of bed activity   Lying to sitting on side of bed assist level: the ability to move from lying on the back to sitting on the side of the bed with no back support.: Contact Guard/Touching assist     Care Tool Transfers Sit to stand transfer   Sit to stand assist level: Minimal Assistance - Patient > 75%    Chair/bed transfer   Chair/bed transfer assist level:  Minimal Assistance - Patient > 75%     Toilet transfer   Assist Level: Minimal Assistance - Patient > 75%    Car transfer   Car transfer assist level: Minimal Assistance - Patient > 75%      Care Tool Locomotion Ambulation   Assist level: Minimal Assistance - Patient > 75% Assistive device: No Device Max distance: 150'  Walk 10 feet activity   Assist level: Minimal Assistance - Patient > 75% Assistive device: No Device   Walk 50 feet with 2 turns activity   Assist level: Minimal Assistance - Patient > 75% Assistive device: No Device  Walk 150 feet activity   Assist level: Minimal Assistance - Patient > 75% Assistive device: No Device  Walk 10 feet on uneven surfaces activity   Assist level: Minimal Assistance - Patient > 75%    Stairs   Assist level:  Minimal Assistance - Patient > 75% Stairs assistive device: 2 hand rails Max number of stairs: 12  Walk up/down 1 step activity   Walk up/down 1 step (curb) assist level: Minimal Assistance - Patient > 75% Walk up/down 1 step or curb assistive device: 2 hand rails  Walk up/down 4 steps activity   Walk up/down 4 steps assist level: Minimal Assistance - Patient > 75% Walk up/down 4 steps assistive device: 2 hand rails  Walk up/down 12 steps activity   Walk up/down 12 steps assist level: Minimal Assistance - Patient > 75% Walk up/down 12 steps assistive device: 2 hand rails  Pick up small objects from floor   Pick up small object from the floor assist level: Minimal Assistance - Patient > 75%    Wheelchair Is the patient using a wheelchair?: No          Wheel 50 feet with 2 turns activity      Wheel 150 feet activity        Refer to Care Plan for Long Term Goals  SHORT TERM GOAL WEEK 1 PT Short Term Goal 1 (Week 1): STG = LTG due Tto ELOS  Recommendations for other services: Neuropsych  Skilled Therapeutic Intervention Mobility Bed Mobility Bed Mobility: Rolling Left;Left Sidelying to Sit Rolling Left:  Contact Guard/Touching assist Left Sidelying to Sit: Contact Guard/Touching assist Transfers Transfers: Sit to Stand;Stand to Sit;Stand Pivot Transfers Sit to Stand: Minimal Assistance - Patient > 75% Stand to Sit: Minimal Assistance - Patient > 75% Stand Pivot Transfers: Minimal Assistance - Patient > 75% Stand Pivot Transfer Details: Tactile cues for posture;Tactile cues for weight shifting Stand Pivot Transfer Details (indicate cue type and reason): posterior bias Transfer (Assistive device): None Locomotion  Gait Ambulation: Yes Gait Assistance: Minimal Assistance - Patient > 75% Gait Distance (Feet): 150 Feet Assistive device: None Gait Assistance Details: Manual facilitation for weight bearing;Verbal cues for gait pattern;Verbal cues for safe use of DME/AE;Verbal cues for precautions/safety;Tactile cues for placement Gait Gait: Yes Gait Pattern: Impaired Gait Pattern: Step-through pattern;Trunk flexed;Lateral trunk lean to left;Lateral trunk lean to right Stairs / Additional Locomotion Stairs: Yes Stairs Assistance: Minimal Assistance - Patient > 75% Stair Management Technique: Two rails;Forwards;Alternating pattern Number of Stairs: 12 Height of Stairs: 6 Wheelchair Mobility Wheelchair Mobility: No    Skilled Intervention: Pt sitting in recliner to start - agreeable to PT evaluation without reports of pain. Pt pleasant and cooperative throughout assessment, jovial and in good spirits. Initiated functional mobility as outlined above. Mobility completed without an AD as patient was independent and didn't use a RW prior to admission. Functional outcome measures assessed to assess falls risk.   5xSTS 20 seconds  BERG 26/56 TUG 24.5 with no AD  Pt ended session seated in recliner with safety belt alarm on and call bell in lap. All needs met.   Instructed pt in results of PT evaluation as detailed above, PT POC, rehab potential, rehab goals, and discharge recommendations.  Additionally discussed CIR's policies regarding fall safety and use of chair alarm and/or quick release belt. Pt verbalized understanding and in agreement. Will update pt's family members as they become available.   Discharge Criteria: Patient will be discharged from PT if patient refuses treatment 3 consecutive times without medical reason, if treatment goals not met, if there is a change in medical status, if patient makes no progress towards goals or if patient is discharged from hospital.  The above assessment, treatment plan, treatment alternatives and goals were  discussed and mutually agreed upon: by patient  [name]  PT, DPT, CSRS  10/07/2022, 2:42 PM

## 2022-10-07 NOTE — Progress Notes (Signed)
Patient ID: [name], male   DOB: 1934-12-19, 88 y.o.   MRN: [id number] Met with the patient to review current situation, rehab process, team conference and plan of care.Reviewed medications for secondary risk management including  DM (A1C 8.4), HTN, HLD, A-fib. Denies diplopia. Reviewed dietary modification recommendations and insulin administration. Familiar with Ozempic PTA; reports daughter sets up meds using a pill box. Regularly takes morning meds but is inconsistent with night time medications.  Continue to follow along to address educational needs to facilitate preparation for discharge. Pamelia Hoit

## 2022-10-08 DIAGNOSIS — I639 Cerebral infarction, unspecified: Secondary | ICD-10-CM | POA: Diagnosis not present

## 2022-10-08 LAB — GLUCOSE, CAPILLARY
Glucose-Capillary: 115 mg/dL — ABNORMAL HIGH (ref 70–99)
Glucose-Capillary: 226 mg/dL — ABNORMAL HIGH (ref 70–99)
Glucose-Capillary: 149 mg/dL — ABNORMAL HIGH (ref 70–99)
Glucose-Capillary: 130 mg/dL — ABNORMAL HIGH (ref 70–99)

## 2022-10-08 NOTE — Progress Notes (Signed)
PROGRESS NOTE   Subjective/Complaints: C/o constipation. Had BM today but says he has been going once per week and stool is hard and this disturbs him. Does not feel that he eats healthy  ROS: Patient denies fever, rash, sore throat, blurred vision, dizziness, nausea, vomiting, diarrhea, cough, shortness of breath or chest pain, joint or back/neck pain, headache, or mood change. +constipation   Objective:   No results found. Recent Labs    10/07/22 0758  WBC 7.7  HGB 13.2  HCT 38.0*  PLT 180   Recent Labs    10/06/22 0347 10/07/22 0758  NA 137 138  K 3.5 3.8  CL 103 100  CO2 26 27  GLUCOSE 134* 158*  BUN 23 23  CREATININE 1.25* 1.34*  CALCIUM 9.2 9.3    Intake/Output Summary (Last 24 hours) at 10/08/2022 1904 Last data filed at 10/08/2022 1745 Gross per 24 hour  Intake 600 ml  Output --  Net 600 ml        Physical Exam: Vital Signs Blood pressure 134/71, pulse 73, temperature 99.2 F (37.3 C), temperature source Oral, resp. rate 18, weight 79.4 kg, SpO2 98 %.  Gen: no distress, normal appearing HEENT: oral mucosa pink and moist, NCAT Cardio: Reg rate Chest: normal effort, normal rate of breathing Abd: soft, non-distended Ext: no edema Psych: pleasant, normal affect Chest: CTA bilaterally without wheezes, rales, or rhonchi; no distress Abdomen: Soft, non-tender, non-distended, bowel sounds positive. Extremities: No clubbing, cyanosis, or edema. Pulses are 2+ Psych: Pt's affect is appropriate. Pt is cooperative Skin: Clean and intact without signs of breakdown. Mild ingrown medial edge of great toe nail. No drainage or swelling, minimal redness.  Neuro:  Alert and oriented x 3. Normal insight and awareness. Intact Memory. Normal language and speech. Cranial nerve exam unremarkable except for mild left C7. MMT: RUE 4+ to 5/5 prox to distal. RLE 4+ prox to 5/5 distal. LUE 4- prox to 4+ distally. LLE 4+ to  5-/5 prox to distal.   Musculoskeletal: Full ROM, No pain with AROM or PROM in the neck, trunk, or extremities. Posture appropriate     Assessment/Plan: 1. Functional deficits which require 3+ hours per day of interdisciplinary therapy in a comprehensive inpatient rehab setting. Physiatrist is providing close team supervision and 24 hour management of active medical problems listed below. Physiatrist and rehab team continue to assess barriers to discharge/monitor patient progress toward functional and medical goals  Care Tool:  Bathing    Body parts bathed by patient: Right arm, Left arm, Chest, Abdomen, Front perineal area, Buttocks, Right upper leg, Face, Left upper leg   Body parts bathed by helper: Right lower leg, Left lower leg     Bathing assist Assist Level: Minimal Assistance - Patient > 75%     Upper Body Dressing/Undressing Upper body dressing   What is the patient wearing?: Pull over shirt    Upper body assist Assist Level: Set up assist    Lower Body Dressing/Undressing Lower body dressing      What is the patient wearing?: Incontinence brief, Pants     Lower body assist Assist for lower body dressing: Moderate Assistance - Patient 50 -  74%     Toileting Toileting Toileting Activity did not occur Press photographer and hygiene only): N/A (no void or bm)  Toileting assist       Transfers Chair/bed transfer  Transfers assist     Chair/bed transfer assist level: Minimal Assistance - Patient > 75%     Locomotion Ambulation   Ambulation assist      Assist level: Minimal Assistance - Patient > 75% Assistive device: No Device Max distance: 150'   Walk 10 feet activity   Assist     Assist level: Minimal Assistance - Patient > 75% Assistive device: No Device   Walk 50 feet activity   Assist    Assist level: Minimal Assistance - Patient > 75% Assistive device: No Device    Walk 150 feet activity   Assist    Assist level:  Minimal Assistance - Patient > 75% Assistive device: No Device    Walk 10 feet on uneven surface  activity   Assist     Assist level: Minimal Assistance - Patient > 75%     Wheelchair     Assist Is the patient using a wheelchair?: No             Wheelchair 50 feet with 2 turns activity    Assist            Wheelchair 150 feet activity     Assist          Blood pressure 134/71, pulse 73, temperature 99.2 F (37.3 C), temperature source Oral, resp. rate 18, weight 79.4 kg, SpO2 98 %.  Medical Problem List and Plan: 1. 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  Antithrombotics: -DVT/anticoagulation:  Pharmaceutical: Eliquis             -antiplatelet therapy: Plavix 75 mg daily 3. Pain Management: Tylenol as needed 4. Mood/Behavior/Sleep: Provide emotional support             -antipsychotic agents: N/A 5. Neuropsych/cognition: This patient is capable of making decisions on his own behalf. 6. Skin/Wound Care: Routine skin checks  -no treatment necessary for ingrown nail--can address as outpt if needed 7. Fluids/Electrolytes/Nutrition: Routine in and outs with follow-up chemistries 8.  Diabetes mellitus.  Hemoglobin A1c 8.4.  Presently on Semglee 5 units nightly.  Check blood sugars before meals and at bedtime.  Patient on  Glucophage 1000 mg twice daily prior to admission as well as Ozempic and Jardiance 25 mg daily.  CBG (last 3)  Recent Labs    10/08/22 0618 10/08/22 1142 10/08/22 1631  GLUCAP 115* 226* 149*    -Resume jardiance. Dc semglee. Start psyllium daily 9.  Hypertension.Presently on Sectral 200 mg daily, Cialis 5 mg daily., and on Benicar 40-25 mg daily--drop hydrochlorothiazide component given #10.      -6/21 bp well controlled 10.  AKI.  I suspect he might have a chronic component as well  -6/21 slight increase of Cr to 1.34 today   -encourage fluids   -dc hydrochlorothiazide   -recheck labs Monday 11.  Atrial fibrillation/CAD/CABG.  Status post loop recorder.  Followed by cardiology services.  Cardiac rate controlled.  Continue Eliquis 12.  Hyperlipidemia.  Continue Zetia/Lipitor 13.  BPH.  Continue Flomax 0.4 mg daily, Myrbetriq 50 mg daily  -remove external cath--up to toile to void.  14.  Obesity.  BMI 25.84.  Dietary follow-up  15. Constipation: start psyllium daily    LOS: 2 days A FACE TO FACE EVALUATION WAS PERFORMED  Drema Pry Daijha Leggio 10/08/2022, 7:04 PM

## 2022-10-08 NOTE — Progress Notes (Signed)
Physical Therapy Session Note  Patient Details  Name: [name] MRN: [id number] Date of Birth: Sep 02, 1934  Today's Date: 10/08/2022 PT Individual Time: 1015-1120 PT Individual Time Calculation (min): 65 min   Short Term Goals: Week 1:  PT Short Term Goal 1 (Week 1): STG = LTG due Tto ELOS  Skilled Therapeutic Interventions/Progress Updates: Patient in recliner with daughter present on entrance to room. Patient alert and agreeable to PT session.   Patient reported no complaints at beginning of session.  Therapeutic Activity: Transfers: Pt performed sit<>stand transfers throughout session with no AD and with CGA for safety.  Gait Training:  Pt ambulated from room to main gym and then back to room (150'+) with a small break in between due to patient reporting need for BM. Patient provided with outside room supervision for safety and demonstrated aiblity to provide self care wihtout assistance. Patient reported habit of downward gaze with cues and intervention perfromed to challenge dnamic balance by PTA providing cues to look in various directions.. Patient required CGA throughout with occasional light minA due to unsteadiness. Patient demonstrated ability to perform stepping stratgies to correct LOB (minA for safety).  Neuromuscular Re-ed: NMR facilitated during session with focus on challenging patient's dynamic balance. - Orange hurdles in main gym (6") with cues to decrease cadence per presentation of using momentum to step over. Patient required CGA/minA for safety. Patient progressed to reciprocal pattern and required minA with cues to take time with stepping over as to avoid tripping or losing balance. - 5 x sit to stands with tidal tank on hi/low mat at lowest level. Patient required rest break throughout and reported SOB. SpO2 monitored and was WNL. Patient cued to perform pursed lip breathing. Patient intially required CGA, and then light minA with cues to anteriorly scoot, and  to weight shift forward.  NMR performed for improvements in motor control and coordination, balance, sequencing, judgement, and self confidence/ efficacy in performing all aspects of mobility at highest level of independence.   Patient in recliner at end of session with brakes locked, chair alarm set, and all needs within reach.      Therapy Documentation Precautions:  Precautions Precautions: Fall Restrictions Weight Bearing Restrictions: No  Therapy/Group: Individual Therapy  [name] PTA 10/08/2022, 12:37 PM

## 2022-10-08 NOTE — Progress Notes (Signed)
Occupational Therapy Session Note  Patient Details  Name: [name] MRN: [id number] Date of Birth: 07-05-1934  Today's Date: 10/08/2022 OT Individual Time: 0100-0200 OT Individual Time Calculation (min): 60 min    Short Term Goals: Week 1:  OT Short Term Goal 1 (Week 1): STG = LTG due to LOS  Skilled Therapeutic Interventions/Progress Updates:    Patient siting in  recliner at the time of arrival. The pt appears to be  in good spirits with no report of pain at the time of treatmente. The pt indicated that he rested well last night.  The patient agreed with completing UB exercise to improve his UB strength for safe and Ind task performance. Prior to completing UB exercise, the pt indicated that he needed to go to the restroom, he was able to come from sit to stand with CGA and he ambulated to the restroom, free of a device,  with his gait belt in place and CGA for ambulating. . The pt was able to doff his pull-up and pants with CGA.the pt was able to lower himself to the commode at the same LOF. The pt went on to complete hygiene during toileting  with s/u assist and was able to donn his brief and pants with SBA.  The pt was able to ambulate from the restroom to the sink with CGA and additional time for washing his hands. At the end of the session, the pt returned to a  seated position in the recliner with his safety belt activated  and his bedside table in place . All additional needs of the pt were addressed prior to exiting the room.   Therapy Documentation Precautions:  Precautions Precautions: Fall Restrictions Weight Bearing Restrictions: No  Therapy/Group: Individual Therapy  Lavona Mound 10/08/2022, 4:18 PM

## 2022-10-08 NOTE — Progress Notes (Signed)
Physical Therapy Session Note  Patient Details  Name: [name] MRN: [id number] Date of Birth: 1934/08/02  Today's Date: 10/08/2022 PT Individual Time: 1430-1530 PT Individual Time Calculation (min): 60 min   Short Term Goals: Week 1:  PT Short Term Goal 1 (Week 1): STG = LTG due Tto ELOS  Skilled Therapeutic Interventions/Progress Updates:    Chart reviewed and pt agreeable to therapy. Pt received seated in recliner with no c/o pain. Session focused on ambulation independence and balance training to promote safe home access. Pt initiated session with blocked practice of amb ranging form 132ft - 281ft using CGA + HHA fading to CGA + no HHA. Pt noted to have increased fatigue in RLE with increased activity. Pt then completed series of balance exercises include neutral stance, narrow stance, and neutral stance + eyes closed with CGA. Pt noted to have 1 LOB when returning to chair that was mitigated with ModA from PT. PT and pt then reviewed education on safe stand to sit transfer. At end of session, pt was left seated in [facility] with alarm engaged, nurse call bell and all needs in reach.     Therapy Documentation Precautions:  Precautions Precautions: Fall Restrictions Weight Bearing Restrictions: No General: PT Amount of Missed Time (min): 10 Minutes PT Missed Treatment Reason: Other (Comment) (Time management)     Therapy/Group: Individual Therapy  Dionne Milo, PT, DPT 10/08/2022, 3:42 PM

## 2022-10-09 DIAGNOSIS — I639 Cerebral infarction, unspecified: Secondary | ICD-10-CM | POA: Diagnosis not present

## 2022-10-09 LAB — BASIC METABOLIC PANEL
Anion gap: 9 (ref 5–15)
BUN: 20 mg/dL (ref 8–23)
CO2: 23 mmol/L (ref 22–32)
Calcium: 8.7 mg/dL — ABNORMAL LOW (ref 8.9–10.3)
Chloride: 103 mmol/L (ref 98–111)
Creatinine, Ser: 1.47 mg/dL — ABNORMAL HIGH (ref 0.61–1.24)
GFR, Estimated: 46 mL/min — ABNORMAL LOW (ref >60)
Glucose, Bld: 129 mg/dL — ABNORMAL HIGH (ref 70–99)
Potassium: 3.6 mmol/L (ref 3.5–5.1)
Sodium: 135 mmol/L (ref 135–145)

## 2022-10-09 LAB — GLUCOSE, CAPILLARY
Glucose-Capillary: 138 mg/dL — ABNORMAL HIGH (ref 70–99)
Glucose-Capillary: 230 mg/dL — ABNORMAL HIGH (ref 70–99)
Glucose-Capillary: 154 mg/dL — ABNORMAL HIGH (ref 70–99)
Glucose-Capillary: 217 mg/dL — ABNORMAL HIGH (ref 70–99)

## 2022-10-09 MED ORDER — CALCIUM CARBONATE ANTACID 500 MG PO CHEW
1.0000 | CHEWABLE_TABLET | Freq: Every day | ORAL | Status: DC
  Administered 2022-10-09 – 2022-10-11 (×3): 200 mg via ORAL
  Filled 2022-10-09 (×3): qty 1

## 2022-10-09 MED ORDER — SODIUM CHLORIDE 0.9 % IV BOLUS
250.0000 mL | Freq: Once | INTRAVENOUS | Status: AC
  Administered 2022-10-09: 250 mL via INTRAVENOUS

## 2022-10-09 MED ORDER — SENNOSIDES-DOCUSATE SODIUM 8.6-50 MG PO TABS
1.0000 | ORAL_TABLET | Freq: Every day | ORAL | Status: DC
  Administered 2022-10-09 – 2022-10-11 (×3): 1 via ORAL
  Filled 2022-10-09 (×3): qty 1

## 2022-10-09 NOTE — Progress Notes (Signed)
Up in wheelchair eating lunch with family at bedside.  Asked to come back later.  Rn notified to place the consult in when ready.

## 2022-10-09 NOTE — Progress Notes (Signed)
PROGRESS NOTE   Subjective/Complaints: +hypertension Feels he will be bored today since he does not have therapy, grounds pass ordered Had good BM yesterday  ROS: Patient denies fever, rash, sore throat, blurred vision, dizziness, nausea, vomiting, diarrhea, cough, shortness of breath or chest pain, joint or back/neck pain, headache, or mood change. +constipation- improved   Objective:   No results found. Recent Labs    10/07/22 0758  WBC 7.7  HGB 13.2  HCT 38.0*  PLT 180   Recent Labs    10/07/22 0758 10/09/22 0641  NA 138 135  K 3.8 3.6  CL 100 103  CO2 27 23  GLUCOSE 158* 129*  BUN 23 20  CREATININE 1.34* 1.47*  CALCIUM 9.3 8.7*    Intake/Output Summary (Last 24 hours) at 10/09/2022 1030 Last data filed at 10/09/2022 0756 Gross per 24 hour  Intake 840 ml  Output --  Net 840 ml        Physical Exam: Vital Signs Blood pressure (!) 149/73, pulse 81, temperature 98.2 F (36.8 C), resp. rate 18, weight 79.4 kg, SpO2 97 %.  Gen: no distress, normal appearing, BMI 25.84 HEENT: oral mucosa pink and moist, NCAT Cardio: Reg rate Chest: normal effort, normal rate of breathing Abd: soft, non-distended Ext: no edema Psych: pleasant, normal affect Chest: CTA bilaterally without wheezes, rales, or rhonchi; no distress Abdomen: Soft, non-tender, non-distended, bowel sounds positive. Extremities: No clubbing, cyanosis, or edema. Pulses are 2+ Psych: Pt's affect is appropriate. Pt is cooperative Skin: Clean and intact without signs of breakdown. Mild ingrown medial edge of great toe nail. No drainage or swelling, minimal redness.  Neuro:  Alert and oriented x 3. Normal insight and awareness. Intact Memory. Normal language and speech. Cranial nerve exam unremarkable except for mild left C7. MMT: RUE 4+ to 5/5 prox to distal. RLE 4+ prox to 5/5 distal. LUE 4- prox to 4+ distally. LLE 4+ to 5-/5 prox to distal.    Musculoskeletal: Full ROM, No pain with AROM or PROM in the neck, trunk, or extremities. Posture appropriate     Assessment/Plan: 1. Functional deficits which require 3+ hours per day of interdisciplinary therapy in a comprehensive inpatient rehab setting. Physiatrist is providing close team supervision and 24 hour management of active medical problems listed below. Physiatrist and rehab team continue to assess barriers to discharge/monitor patient progress toward functional and medical goals  Care Tool:  Bathing    Body parts bathed by patient: Right arm, Left arm, Chest, Abdomen, Front perineal area, Buttocks, Right upper leg, Face, Left upper leg   Body parts bathed by helper: Right lower leg, Left lower leg     Bathing assist Assist Level: Minimal Assistance - Patient > 75%     Upper Body Dressing/Undressing Upper body dressing   What is the patient wearing?: Pull over shirt    Upper body assist Assist Level: Set up assist    Lower Body Dressing/Undressing Lower body dressing      What is the patient wearing?: Incontinence brief, Pants     Lower body assist Assist for lower body dressing: Moderate Assistance - Patient 50 - 74%     Toileting Toileting  Toileting Activity did not occur Press photographer and hygiene only): N/A (no void or bm)  Toileting assist       Transfers Chair/bed transfer  Transfers assist     Chair/bed transfer assist level: Minimal Assistance - Patient > 75%     Locomotion Ambulation   Ambulation assist      Assist level: Minimal Assistance - Patient > 75% Assistive device: No Device Max distance: 150'   Walk 10 feet activity   Assist     Assist level: Minimal Assistance - Patient > 75% Assistive device: No Device   Walk 50 feet activity   Assist    Assist level: Minimal Assistance - Patient > 75% Assistive device: No Device    Walk 150 feet activity   Assist    Assist level: Minimal Assistance -  Patient > 75% Assistive device: No Device    Walk 10 feet on uneven surface  activity   Assist     Assist level: Minimal Assistance - Patient > 75%     Wheelchair     Assist Is the patient using a wheelchair?: No             Wheelchair 50 feet with 2 turns activity    Assist            Wheelchair 150 feet activity     Assist          Blood pressure (!) 149/73, pulse 81, temperature 98.2 F (36.8 C), resp. rate 18, weight 79.4 kg, SpO2 97 %.  Medical Problem List and Plan: 1. Functional deficits secondary to multiple scattered bilateral ischemic infarct status post thrombectomy of left P1/PCA             -patient may shower             -ELOS/Goals: 8-12 days, Mod I level PT/OT/SLP  -Patient is beginning CIR therapies today including PT, OT, and SLP   Grounds pass ordered 2.  Antithrombotics: -DVT/anticoagulation:  Pharmaceutical: Eliquis             -antiplatelet therapy: Plavix 75 mg daily 3. Pain Management: Tylenol as needed 4. Mood/Behavior/Sleep: Provide emotional support             -antipsychotic agents: N/A 5. Neuropsych/cognition: This patient is capable of making decisions on his own behalf. 6. Skin/Wound Care: Routine skin checks  -no treatment necessary for ingrown nail--can address as outpt if needed 7. Fluids/Electrolytes/Nutrition: Routine in and outs with follow-up chemistries 8.  Diabetes mellitus.  Hemoglobin A1c 8.4.  Presently on Semglee 5 units nightly.  Check blood sugars before meals and at bedtime.  Patient on  Glucophage 1000 mg twice daily prior to admission as well as Ozempic and Jardiance 25 mg daily.  CBG (last 3)  Recent Labs    10/08/22 1631 10/08/22 2128 10/09/22 0632  GLUCAP 149* 130* 138*    -Resume jardiance. Dc semglee. Start psyllium daily 9.  Hypertension.Presently on Sectral 200 mg daily, Cialis 5 mg daily., and on Benicar 40-25 mg daily--drop hydrochlorothiazide component given #10.     -6/21 bp well  controlled 10.  AKI.  I suspect he might have a chronic component as well  -6/21 slight increase of Cr to 1.34 today   -encourage fluids   -dc hydrochlorothiazide  250 cc fluid bolus ordered 6/23 since Cr uptrending slightly 11.  Atrial fibrillation/CAD/CABG.  Status post loop recorder.  Followed by cardiology services.  Cardiac rate controlled.  Continue Eliquis 12.  Hyperlipidemia.  Continue Zetia/Lipitor 13.  BPH.  Continue Flomax 0.4 mg daily, Myrbetriq 50 mg daily  -remove external cath--up to toile to void.  14.  Obesity.  BMI 25.84.  Dietary follow-up  15. Constipation: start psyllium daily, decrease senna to 1 tab. Last BM 6/22 as per patient, discussed with nursing  16. Hypocalcemia: tums started after supper     LOS: 3 days A FACE TO FACE EVALUATION WAS PERFORMED  Natali Lavallee P Mollee Neer 10/09/2022, 10:30 AM

## 2022-10-10 DIAGNOSIS — I639 Cerebral infarction, unspecified: Secondary | ICD-10-CM | POA: Diagnosis not present

## 2022-10-10 LAB — CBC WITH DIFFERENTIAL/PLATELET
Abs Immature Granulocytes: 0.1 10*3/uL — ABNORMAL HIGH (ref 0.00–0.07)
Basophils Absolute: 0.1 10*3/uL (ref 0.0–0.1)
Basophils Relative: 0 %
Eosinophils Absolute: 0.1 10*3/uL (ref 0.0–0.5)
Eosinophils Relative: 1 %
HCT: 38.3 % — ABNORMAL LOW (ref 39.0–52.0)
Hemoglobin: 12.7 g/dL — ABNORMAL LOW (ref 13.0–17.0)
Immature Granulocytes: 1 %
Lymphocytes Relative: 13 %
Lymphs Abs: 2.2 10*3/uL (ref 0.7–4.0)
MCH: 31.6 pg (ref 26.0–34.0)
MCHC: 33.2 g/dL (ref 30.0–36.0)
MCV: 95.3 fL (ref 80.0–100.0)
Monocytes Absolute: 1.3 10*3/uL — ABNORMAL HIGH (ref 0.1–1.0)
Monocytes Relative: 8 %
Neutro Abs: 12.7 10*3/uL — ABNORMAL HIGH (ref 1.7–7.7)
Neutrophils Relative %: 77 %
Platelets: 219 10*3/uL (ref 150–400)
RBC: 4.02 MIL/uL — ABNORMAL LOW (ref 4.22–5.81)
RDW: 13.1 % (ref 11.5–15.5)
WBC: 16.5 10*3/uL — ABNORMAL HIGH (ref 4.0–10.5)
nRBC: 0 % (ref 0.0–0.2)

## 2022-10-10 LAB — CBC
HCT: 38.1 % — ABNORMAL LOW (ref 39.0–52.0)
Hemoglobin: 13 g/dL (ref 13.0–17.0)
MCH: 32.6 pg (ref 26.0–34.0)
MCHC: 34.1 g/dL (ref 30.0–36.0)
MCV: 95.5 fL (ref 80.0–100.0)
Platelets: 193 10*3/uL (ref 150–400)
RBC: 3.99 MIL/uL — ABNORMAL LOW (ref 4.22–5.81)
RDW: 13.2 % (ref 11.5–15.5)
WBC: 16.3 10*3/uL — ABNORMAL HIGH (ref 4.0–10.5)
nRBC: 0 % (ref 0.0–0.2)

## 2022-10-10 LAB — URINALYSIS, W/ REFLEX TO CULTURE (INFECTION SUSPECTED)
Bacteria, UA: NONE SEEN
Bilirubin Urine: NEGATIVE
Glucose, UA: >=500 mg/dL — AB
Hgb urine dipstick: NEGATIVE
Ketones, ur: NEGATIVE mg/dL
Leukocytes,Ua: NEGATIVE
Nitrite: NEGATIVE
Protein, ur: 30 mg/dL — AB
Specific Gravity, Urine: 1.02 (ref 1.005–1.030)
pH: 5 (ref 5.0–8.0)

## 2022-10-10 LAB — BASIC METABOLIC PANEL
Anion gap: 10 (ref 5–15)
BUN: 18 mg/dL (ref 8–23)
CO2: 22 mmol/L (ref 22–32)
Calcium: 8.5 mg/dL — ABNORMAL LOW (ref 8.9–10.3)
Chloride: 101 mmol/L (ref 98–111)
Creatinine, Ser: 1.48 mg/dL — ABNORMAL HIGH (ref 0.61–1.24)
GFR, Estimated: 46 mL/min — ABNORMAL LOW (ref >60)
Glucose, Bld: 155 mg/dL — ABNORMAL HIGH (ref 70–99)
Potassium: 3.5 mmol/L (ref 3.5–5.1)
Sodium: 133 mmol/L — ABNORMAL LOW (ref 135–145)

## 2022-10-10 LAB — GLUCOSE, CAPILLARY
Glucose-Capillary: 165 mg/dL — ABNORMAL HIGH (ref 70–99)
Glucose-Capillary: 226 mg/dL — ABNORMAL HIGH (ref 70–99)
Glucose-Capillary: 153 mg/dL — ABNORMAL HIGH (ref 70–99)

## 2022-10-10 NOTE — Progress Notes (Signed)
Physical Therapy Session Note  Patient Details  Name: [name] MRN: [id number] Date of Birth: 08/29/1934  Today's Date: 10/10/2022 PT Individual Time: 1478-2956 + 1415-1526 PT Individual Time Calculation (min): 73 min  + 71 min  Short Term Goals: Week 1:  PT Short Term Goal 1 (Week 1): STG = LTG due Tto ELOS  Skilled Therapeutic Interventions/Progress Updates:      1st session: Pt sitting in recliner on arrival - agreeable to therapy session. No reports of pain. Pt having difficulty recalling events from earlier therapy session. Tangential in thought, but this is likely baseline. Sit<>stand to RW with supervision. Ambulates with supervision and RW from his room to ortho rehab gym. Gait speed slowed and has slight trunk lean to the R - pt reports being aware but has difficulty correcting. UE SciFit ergometer in standing at "random" preset settings, 3 min + 7 min with seated rest break b/w sets due to fatigue. Continued to challenge standing endurance and balance while completing punching (using boxing gloves) to target pads that PT moved around - completed jabs, cross body jabs, and upper cuts - pt with rigid trunk and relying on back of legs against table for help with balance. Gait training with 4# ankle weights to each ankle, using RW to ambulate hallway distances, ~164ft + ~150ft with supervision and RW. Pt with preferred downward gaze, reports he's been doing that "for years." Worked on postural awareness to reduce R lean and keep body within walker frame. Patient ended session seated in recliner with safety belt alarm on and call bell in lap.    2nd session: Pt sitting in recliner to start with his son, [name], at the bedside. Quickly discussed PT goals, PT POC, and pt's progress with therapy. Also discussed likely need for RW as DME for fall prevention, as well as some general home safety tips such as removing throw rugs and adding night lights to bathrooms as patient reports frequent  urination at night time.  Sit<>stand to RW with supervision. Asked patient if he needed to use restroom prior to leaving room and he denies. While ambulating in hallway with supervision and RW, pt reports urgent need to void. Returned to his room and assisted to the bathroom with supervision and RW - pt continent of bladder void when sitting - charted.   Ambulated from his room, to the elevator, through the [employer], with supervision and RW. Gait speed slows as he talks and sometimes he stops to talk, suggesting impaired dual-cog tasking. Continues to demonstrate R slight lean and had x1 self corrected, minor, LOB to the R while turning.   In [place], patient telling stories that he has told before (earlier in AM as well as when seen last Friday). Pt appreciative of time spent in the Lobby, stating that "this is therapeutic for me."  Ambulated back the same route to CIR floor when mod directional cues needed, using the RW with supervision assist. In Ortho Rehab Gym, assisted on Nustep and he completed 9 + 6 minutes at L6 resistance with B UE/LE to challenge general strengthening and cardiovascular endurance. Pt inquiring on DC date and is hoping for sooner DC as he misses home. Educated on benefits of mobility and fall prevention, as well as family education prior to DC.   Ambulated back to his room with supervision and RW, ending session seated in recliner with safety belt alarm on and call bell in reach.     Therapy Documentation Precautions:  Precautions Precautions: Fall Restrictions Weight Bearing Restrictions: No General:     Therapy/Group: Individual Therapy  [name] 10/10/2022, 7:41 AM

## 2022-10-10 NOTE — Progress Notes (Signed)
PROGRESS NOTE   Subjective/Complaints: No events overnight.  No acute complaints. Vitals stable WBC increased 7 to 16 without s/s infection.  Denies full review of systems. Last BM 6/23  ROS: Patient denies fever, rash, sore throat, blurred vision, dizziness, nausea, vomiting, diarrhea, cough, shortness of breath or chest pain, joint or back/neck pain, headache, or mood change. +constipation- improved   Objective:   No results found. Recent Labs    10/10/22 0558  WBC 16.3*  HGB 13.0  HCT 38.1*  PLT 193    Recent Labs    10/09/22 0641 10/10/22 0558  NA 135 133*  K 3.6 3.5  CL 103 101  CO2 23 22  GLUCOSE 129* 155*  BUN 20 18  CREATININE 1.47* 1.48*  CALCIUM 8.7* 8.5*     Intake/Output Summary (Last 24 hours) at 10/10/2022 0802 Last data filed at 10/10/2022 0715 Gross per 24 hour  Intake 1690.29 ml  Output 125 ml  Net 1565.29 ml         Physical Exam: Vital Signs Blood pressure 135/63, pulse 75, temperature 98.4 F (36.9 C), resp. rate 19, weight 79.4 kg, SpO2 96 %.  Gen: no distress, normal appearing.  Sitting upright. HEENT: oral mucosa pink and moist, NCAT Cardio: Reg rate Chest: normal effort, normal rate of breathing Abd: soft, non-distended Ext: no edema Psych: pleasant, normal affect Chest: CTA bilaterally without wheezes, rales, or rhonchi; no distress Abdomen: Soft, non-tender, non-distended, bowel sounds positive. Extremities: No clubbing, cyanosis, or edema. Pulses are 2+ Psych: Pt's affect is appropriate. Pt is cooperative Skin: Clean and intact without signs of breakdown.  Mild ingrown medial edge of great toe nail.  No drainage or swelling, minimal redness.   Neuro:  Alert and oriented x 3.  Moving all 4 limbs antigravity and against resistance. Prior exams: Normal insight and awareness. Intact Memory. Normal language and speech. Cranial nerve exam unremarkable except for mild  left C7. MMT: RUE 4+ to 5/5 prox to distal. RLE 4+ prox to 5/5 distal. LUE 4- prox to 4+ distally. LLE 4+ to 5-/5 prox to distal.    Musculoskeletal: Full ROM, No pain with AROM or PROM in the neck, trunk, or extremities. Posture appropriate     Assessment/Plan: 1. Functional deficits which require 3+ hours per day of interdisciplinary therapy in a comprehensive inpatient rehab setting. Physiatrist is providing close team supervision and 24 hour management of active medical problems listed below. Physiatrist and rehab team continue to assess barriers to discharge/monitor patient progress toward functional and medical goals  Care Tool:  Bathing    Body parts bathed by patient: Right arm, Left arm, Chest, Abdomen, Front perineal area, Buttocks, Right upper leg, Face, Left upper leg   Body parts bathed by helper: Right lower leg, Left lower leg     Bathing assist Assist Level: Minimal Assistance - Patient > 75%     Upper Body Dressing/Undressing Upper body dressing   What is the patient wearing?: Pull over shirt    Upper body assist Assist Level: Set up assist    Lower Body Dressing/Undressing Lower body dressing      What is the patient wearing?: Incontinence brief, Pants  Lower body assist Assist for lower body dressing: Moderate Assistance - Patient 50 - 74%     Toileting Toileting Toileting Activity did not occur Press photographer and hygiene only): N/A (no void or bm)  Toileting assist       Transfers Chair/bed transfer  Transfers assist     Chair/bed transfer assist level: Minimal Assistance - Patient > 75%     Locomotion Ambulation   Ambulation assist      Assist level: Minimal Assistance - Patient > 75% Assistive device: No Device Max distance: 150'   Walk 10 feet activity   Assist     Assist level: Minimal Assistance - Patient > 75% Assistive device: No Device   Walk 50 feet activity   Assist    Assist level: Minimal  Assistance - Patient > 75% Assistive device: No Device    Walk 150 feet activity   Assist    Assist level: Minimal Assistance - Patient > 75% Assistive device: No Device    Walk 10 feet on uneven surface  activity   Assist     Assist level: Minimal Assistance - Patient > 75%     Wheelchair     Assist Is the patient using a wheelchair?: No             Wheelchair 50 feet with 2 turns activity    Assist            Wheelchair 150 feet activity     Assist          Blood pressure 135/63, pulse 75, temperature 98.4 F (36.9 C), resp. rate 19, weight 79.4 kg, SpO2 96 %.  Medical Problem List and Plan: 1. Functional deficits secondary to multiple scattered bilateral ischemic infarct status post thrombectomy of left P1/PCA             -patient may shower             -ELOS/Goals: 8-12 days, Mod I level PT/OT/SLP  -Patient is beginning CIR therapies today including PT, OT, and SLP   Grounds pass ordered 2.  Antithrombotics: -DVT/anticoagulation:  Pharmaceutical: Eliquis             -antiplatelet therapy: Plavix 75 mg daily 3. Pain Management: Tylenol as needed 4. Mood/Behavior/Sleep: Provide emotional support             -antipsychotic agents: N/A 5. Neuropsych/cognition: This patient is capable of making decisions on his own behalf. 6. Skin/Wound Care: Routine skin checks  -no treatment necessary for ingrown nail--can address as outpt if needed 7. Fluids/Electrolytes/Nutrition: Routine in and outs with follow-up chemistries 8.  Diabetes mellitus.  Hemoglobin A1c 8.4.  Presently on Semglee 5 units nightly.  Check blood sugars before meals and at bedtime.  Patient on  Glucophage 1000 mg twice daily prior to admission as well as Ozempic and Jardiance 25 mg daily.  CBG (last 3)  - well controlled Recent Labs    10/09/22 1647 10/09/22 2129 10/10/22 0601  GLUCAP 154* 217* 165*     -Resume jardiance. Dc semglee. Start psyllium daily 9.   Hypertension.Presently on Sectral 200 mg daily, Cialis 5 mg daily., and on Benicar 40-25 mg daily--drop hydrochlorothiazide component given #10.     -6/21-24 bp well controlled    10/10/2022    6:05 AM 10/09/2022    7:59 PM 10/09/2022    3:29 PM  Vitals with BMI  Systolic 135 127 098  Diastolic 63 70 74  Pulse 75 74 72  10.  AKI.  I suspect he might have a chronic component as well  -6/21 slight increase of Cr to 1.34 today   -encourage fluids   -dc hydrochlorothiazide  250 cc fluid bolus ordered 6/23 since Cr uptrending slightly  - 6/24: Cr unchanged 1.2-1.4; likely baseline  11.  Atrial fibrillation/CAD/CABG.  Status post loop recorder.  Followed by cardiology services.  Cardiac rate controlled.  Continue Eliquis  12.  Hyperlipidemia.  Continue Zetia/Lipitor  13.  BPH.  Continue Flomax 0.4 mg daily, Myrbetriq 50 mg daily  -remove external cath--up to toile to void.   -Continent voids, monitor  14.  Obesity.  BMI 25.84.  Dietary follow-up  15. Constipation: start psyllium daily, decrease senna to 1 tab. Last BM 6/22 as per patient, discussed with nursing   - LBM 6/23 16. Hypocalcemia: tums started after supper  17. Leukocytosis. 7->16; unknown origin. Vitals stable. Repeat labs with diff and start infectious workup with urine reflex to culture and repeat   -Repeat remains elevated, neutrophil predominant, urinalysis negative.  Order chest x-ray for AM.  Monitor vitals.    LOS: 4 days A FACE TO FACE EVALUATION WAS PERFORMED  Angelina Sheriff 10/10/2022, 8:02 AM

## 2022-10-10 NOTE — Progress Notes (Signed)
Occupational Therapy Session Note  Patient Details  Name: [name] MRN: [id number] Date of Birth: June 21, 1934  Today's Date: 10/10/2022 OT Individual Time: 0800-0902 OT Individual Time Calculation (min): 62 min    Short Term Goals: Week 1:  OT Short Term Goal 1 (Week 1): STG = LTG due to LOS  Skilled Therapeutic Interventions/Progress Updates:    Patient received sleeping sidelying in bed.  Reported a bad weekend - "boring, just laid around."  Patient agreeable to get up out of bed.  Declined shower initially - wanted to work on walking, reports "my balance has always been bad.  Patient stands relying heavily on bracing back of legs against surface.  Worked on increasing forward flexion to help him stand more effectively without bracing.  Patient also stands with very wide base of support.  Worked on narrowing stance in standing to work on balance.  Throwing and catching a ball.  Patient with limited activity tolerance this session.  Reporting need to have BM.  Patient walked to bathroom with facilitation for postural control in midline.  Had continent void on toilet, then walked to shower.  Supervision to shower, min assist to dry himself and set up assistance to don clothing.  Patient eager to leave hospital.  Asked him who will stay with him at discharge, and he indicated friend [place].  Patient is working to set up training with Annia Friendly Tuesday pm.   Patient left up in recliner with safety belt in place and engaged and call bell/ personal items in reach.    Therapy Documentation Precautions:  Precautions Precautions: Fall Restrictions Weight Bearing Restrictions: No  Pain: Pain Assessment Pain Scale: 0-10 Pain Score: 0-No pain    Therapy/Group: Individual Therapy  [name] 10/10/2022, 12:36 PM

## 2022-10-11 ENCOUNTER — Inpatient Hospital Stay (HOSPITAL_COMMUNITY): Payer: HMO

## 2022-10-11 DIAGNOSIS — I639 Cerebral infarction, unspecified: Secondary | ICD-10-CM | POA: Diagnosis not present

## 2022-10-11 LAB — GLUCOSE, CAPILLARY
Glucose-Capillary: 147 mg/dL — ABNORMAL HIGH (ref 70–99)
Glucose-Capillary: 197 mg/dL — ABNORMAL HIGH (ref 70–99)
Glucose-Capillary: 172 mg/dL — ABNORMAL HIGH (ref 70–99)
Glucose-Capillary: 146 mg/dL — ABNORMAL HIGH (ref 70–99)

## 2022-10-11 LAB — CBC WITH DIFFERENTIAL/PLATELET
Abs Immature Granulocytes: 0.1 10*3/uL — ABNORMAL HIGH (ref 0.00–0.07)
Basophils Absolute: 0.1 10*3/uL (ref 0.0–0.1)
Basophils Relative: 1 %
Eosinophils Absolute: 0.2 10*3/uL (ref 0.0–0.5)
Eosinophils Relative: 2 %
HCT: 35.9 % — ABNORMAL LOW (ref 39.0–52.0)
Hemoglobin: 12.3 g/dL — ABNORMAL LOW (ref 13.0–17.0)
Immature Granulocytes: 1 %
Lymphocytes Relative: 12 %
Lymphs Abs: 1.6 10*3/uL (ref 0.7–4.0)
MCH: 33 pg (ref 26.0–34.0)
MCHC: 34.3 g/dL (ref 30.0–36.0)
MCV: 96.2 fL (ref 80.0–100.0)
Monocytes Absolute: 1.1 10*3/uL — ABNORMAL HIGH (ref 0.1–1.0)
Monocytes Relative: 8 %
Neutro Abs: 10.1 10*3/uL — ABNORMAL HIGH (ref 1.7–7.7)
Neutrophils Relative %: 76 %
Platelets: 216 10*3/uL (ref 150–400)
RBC: 3.73 MIL/uL — ABNORMAL LOW (ref 4.22–5.81)
RDW: 13.1 % (ref 11.5–15.5)
WBC: 13.1 10*3/uL — ABNORMAL HIGH (ref 4.0–10.5)
nRBC: 0 % (ref 0.0–0.2)

## 2022-10-11 MED ORDER — POLYETHYLENE GLYCOL 3350 17 G PO PACK
17.0000 g | PACK | Freq: Every day | ORAL | Status: DC | PRN
  Administered 2022-10-11: 17 g via ORAL
  Filled 2022-10-11: qty 1

## 2022-10-11 NOTE — Progress Notes (Signed)
Patient ID: [name], male   DOB: 12/23/1934, 88 y.o.   MRN: [id number] MD and team feel pt can discharge home tomorrow pt also asking to discharge. Discussed follow up vestibular therapy at [facility] OP. Also ordered a rolling walker via [employer]. His son and grandson along with friend-[place] were here for family education. Spoke with son and friend in the room and pt to update them discharge tomorrow.

## 2022-10-11 NOTE — Progress Notes (Signed)
PROGRESS NOTE   Subjective/Complaints: No new complaints this morning C/o constipation, asked nursing to bring him prune juice Denies pain  ROS: Patient denies fever, rash, sore throat, blurred vision, dizziness, nausea, vomiting, diarrhea, cough, shortness of breath or chest pain, joint or back/neck pain, headache, or mood change. +constipation- improved but still present, denies pain   Objective:   DG Chest 2 View  Result Date: 10/11/2022 CLINICAL DATA:  Leukocytosis EXAM: CHEST - 2 VIEW COMPARISON:  03/11/2017 FINDINGS: There is some bandlike changes in the left lung base. Atelectasis is favored. Minimal at the right lung base as well. No pneumothorax or edema. Normal cardiopericardial silhouette. Calcified aorta. Status post median sternotomy. Diffuse degenerative changes of the spine. Left lower chest loop recorder IMPRESSION: Basilar atelectasis on the left-greater-than-right. Postop chest. Loop recorder. Electronically Signed   By: Karen Kays M.D.   On: 10/11/2022 10:56   Recent Labs    10/10/22 0924 10/11/22 0610  WBC 16.5* 13.1*  HGB 12.7* 12.3*  HCT 38.3* 35.9*  PLT 219 216   Recent Labs    10/09/22 0641 10/10/22 0558  NA 135 133*  K 3.6 3.5  CL 103 101  CO2 23 22  GLUCOSE 129* 155*  BUN 20 18  CREATININE 1.47* 1.48*  CALCIUM 8.7* 8.5*    Intake/Output Summary (Last 24 hours) at 10/11/2022 1128 Last data filed at 10/10/2022 1802 Gross per 24 hour  Intake 480 ml  Output --  Net 480 ml        Physical Exam: Vital Signs Blood pressure 125/67, pulse 73, temperature 97.9 F (36.6 C), temperature source Oral, resp. rate 16, weight 79.4 kg, SpO2 97 %. Gen: no distress, normal appearing HEENT: oral mucosa pink and moist, NCAT Cardio: Reg rate Chest: CTA bilaterally without wheezes, rales, or rhonchi; no distress Abdomen: Soft, non-tender, non-distended, bowel sounds positive. Extremities: No clubbing,  cyanosis, or edema. Pulses are 2+ Psych: Pt's affect is appropriate. Pt is cooperative Skin: Clean and intact without signs of breakdown.  Mild ingrown medial edge of great toe nail.  No drainage or swelling, minimal redness.   Neuro:  Alert and oriented x 3.  Moving all 4 limbs antigravity and against resistance. Prior exams: Normal insight and awareness. Intact Memory. Normal language and speech. Cranial nerve exam unremarkable except for mild left C7. MMT: RUE 4+ to 5/5 prox to distal. RLE 4+ prox to 5/5 distal. LUE 4- prox to 4+ distally. LLE 4+ to 5-/5 prox to distal.    Musculoskeletal: Full ROM, No pain with AROM or PROM in the neck, trunk, or extremities. Posture appropriate     Assessment/Plan: 1. Functional deficits which require 3+ hours per day of interdisciplinary therapy in a comprehensive inpatient rehab setting. Physiatrist is providing close team supervision and 24 hour management of active medical problems listed below. Physiatrist and rehab team continue to assess barriers to discharge/monitor patient progress toward functional and medical goals  Care Tool:  Bathing    Body parts bathed by patient: Right arm, Left arm, Chest, Abdomen, Front perineal area, Buttocks, Right upper leg, Face, Left upper leg   Body parts bathed by helper: Right lower leg, Left lower  leg     Bathing assist Assist Level: Minimal Assistance - Patient > 75%     Upper Body Dressing/Undressing Upper body dressing   What is the patient wearing?: Pull over shirt    Upper body assist Assist Level: Set up assist    Lower Body Dressing/Undressing Lower body dressing      What is the patient wearing?: Incontinence brief, Pants     Lower body assist Assist for lower body dressing: Moderate Assistance - Patient 50 - 74%     Toileting Toileting Toileting Activity did not occur Press photographer and hygiene only): N/A (no void or bm)  Toileting assist Assist for toileting: Minimal  Assistance - Patient > 75%     Transfers Chair/bed transfer  Transfers assist     Chair/bed transfer assist level: Minimal Assistance - Patient > 75%     Locomotion Ambulation   Ambulation assist      Assist level: Minimal Assistance - Patient > 75% Assistive device: No Device Max distance: 150'   Walk 10 feet activity   Assist     Assist level: Minimal Assistance - Patient > 75% Assistive device: No Device   Walk 50 feet activity   Assist    Assist level: Minimal Assistance - Patient > 75% Assistive device: No Device    Walk 150 feet activity   Assist    Assist level: Minimal Assistance - Patient > 75% Assistive device: No Device    Walk 10 feet on uneven surface  activity   Assist     Assist level: Minimal Assistance - Patient > 75%     Wheelchair     Assist Is the patient using a wheelchair?: No             Wheelchair 50 feet with 2 turns activity    Assist            Wheelchair 150 feet activity     Assist          Blood pressure 125/67, pulse 73, temperature 97.9 F (36.6 C), temperature source Oral, resp. rate 16, weight 79.4 kg, SpO2 97 %.  Medical Problem List and Plan: 1. Functional deficits secondary to multiple scattered bilateral ischemic infarct status post thrombectomy of left P1/PCA             -patient may shower             -ELOS/Goals: 8-12 days, Mod I level PT/OT/SLP  -Patient is beginning CIR therapies today including PT, OT, and SLP   Grounds pass ordered 2.  Antithrombotics: -DVT/anticoagulation:  Pharmaceutical: Eliquis             -antiplatelet therapy: Plavix 75 mg daily 3. Pain Management: Tylenol as needed 4. Mood/Behavior/Sleep: Provide emotional support             -antipsychotic agents: N/A 5. Neuropsych/cognition: This patient is capable of making decisions on his own behalf. 6. Skin/Wound Care: Routine skin checks  -no treatment necessary for ingrown nail--can address as  outpt if needed 7. Fluids/Electrolytes/Nutrition: Routine in and outs with follow-up chemistries 8.  Diabetes mellitus.  Hemoglobin A1c 8.4.  Presently on Semglee 5 units nightly.  Check blood sugars before meals and at bedtime.  Patient on  Glucophage 1000 mg twice daily prior to admission as well as Ozempic and Jardiance 25 mg daily.  CBG (last 3)  - well controlled Recent Labs    10/10/22 1138 10/10/22 1636 10/11/22 0612  GLUCAP 226* 153* 147*    -  Resume jardiance. Dc semglee. Start psyllium daily 9.  Hypertension.Presently on Sectral 200 mg daily, Cialis 5 mg daily., and on Benicar 40-25 mg daily--drop hydrochlorothiazide component given #10.     -6/21-24 bp well controlled    10/11/2022    6:16 AM 10/10/2022    7:42 PM 10/10/2022    7:28 PM  Vitals with BMI  Systolic 125 123 623  Diastolic 67 66 70  Pulse 73 99 74     10.  AKI.  I suspect he might have a chronic component as well  -6/21 slight increase of Cr to 1.34 today   -encourage fluids   -dc hydrochlorothiazide  250 cc fluid bolus ordered 6/23 since Cr uptrending slightly  - 6/24: Cr unchanged 1.2-1.4; likely baseline  11.  Atrial fibrillation/CAD/CABG.  Status post loop recorder.  Followed by cardiology services.  Cardiac rate controlled.  Continue Eliquis  12.  Hyperlipidemia.  Continue Zetia/Lipitor  13.  BPH.  Continue Flomax 0.4 mg daily, Myrbetriq 50 mg daily  -remove external cath--up to toile to void.   -Continent voids, monitor  14.  Obesity.  BMI 25.84.  Dietary follow-up  15. Constipation: start psyllium daily, decrease senna to 1 tab. Last BM 6/22 as per patient, discussed with nursing   - LBM 6/23, asked nursing to bring him prune juice  16. Hypocalcemia: tums started after supper, continue  17. Leukocytosis. 7->16; unknown origin. Vitals stable. Repeat labs with diff and start infectious workup with urine reflex to culture and repeat   -Repeat remains elevated, neutrophil predominant, urinalysis  negative.  CXR reviewed and shows atelectasis, CBC reviewed and downtrending  18. Atelectasis: Incentive spirometer ordered    LOS: 5 days A FACE TO FACE EVALUATION WAS PERFORMED  [name] P Imelda Dandridge 10/11/2022, 11:28 AM

## 2022-10-11 NOTE — Patient Care Conference (Signed)
Inpatient RehabilitationTeam Conference and Plan of Care Update Date: 10/11/2022   Time: 15:15 PM    Patient Name: [name]      Medical Record Number: [id number]  Date of Birth: Dec 05, 1934 Sex: Male         Room/Bed: 4M09C/4M09C-01 Payor Info: Payor: [employer] / Plan: [employer] HMO / Product Type: *No Product type* /    Admit Date/Time:  10/06/2022  3:34 PM  Primary Diagnosis:  Ischemic cerebrovascular accident (CVA) [facility])  Hospital Problems: Principal Problem:   Ischemic cerebrovascular accident (CVA) [facility])    Expected Discharge Date: Expected Discharge Date: 10/12/22  Team Members Present: Physician leading conference: Dr. Sula Soda Social Worker Present: Dossie Der, LCSW Nurse Present: Chana Bode, RN PT Present: Wynelle Link, PT OT Present: Bretta Bang, OT     Current Status/Progress Goal Weekly Team Focus  Bowel/Bladder   pt cptinent to bowel and bladder LBM 6/23   pt to remain continent   Assess every shift and prn    Swallow/Nutrition/ Hydration               ADL's   set up and verbal cueing, min assist facilitation   supervision to Mod I   Caregiver training, discharge planning, dynamic stand balance    Mobility   Goal level   mod I to supervision  DC planning, family education and training    Communication                Safety/Cognition/ Behavioral Observations               Pain   patient denies pain   pain goal of <3/10 pt to verbalize pain   Assess every shift and prn    Skin   Skin tear left forearm   wound to show signs of healing  assess, every shift and prn      Discharge Planning:  HOme with children and friend in and out and can provide transportation to OPPT. Ordered rolling walker. Pt insisting on DC tomorrow   Team Discussion: Patient doing well overall. WBC down and CXR clear.  Patient on target to meet rehab goals: yes  *See Care Plan and progress notes for long and  short-term goals.   Revisions to Treatment Plan:  N/a   Teaching Needs: Safety, medications, dietary modifications, transfers, etc.   Current Barriers to Discharge: Decreased caregiver support and Home enviroment access/layout  Possible Resolutions to Barriers: Family education 10/11/22 OP PT ([place] location) DME: RW     Medical Summary Current Status: constipation, leukocytosis, atelectasis  Barriers to Discharge: Medical stability  Barriers to Discharge Comments: constipation, leukocytosis, atelectasis Possible Resolutions to Barriers/Weekly Focus: asked nursing to bring him prune juice, continue psyllium, repeated CBC today, incentive spirometer ordered   Continued Need for Acute Rehabilitation Level of Care: The patient requires daily medical management by a physician with specialized training in physical medicine and rehabilitation for the following reasons: Direction of a multidisciplinary physical rehabilitation program to maximize functional independence : Yes Medical management of patient stability for increased activity during participation in an intensive rehabilitation regime.: Yes Analysis of laboratory values and/or radiology reports with any subsequent need for medication adjustment and/or medical intervention. : Yes   I attest that I was present, lead the team conference, and concur with the assessment and plan of the team.   Chana Bode B 10/11/2022, 3:50 PM

## 2022-10-11 NOTE — Progress Notes (Signed)
Physical Therapy Session Note  Patient Details  Name: [name] MRN: [id number] Date of Birth: 09-06-1934  Today's Date: 10/11/2022 PT Individual Time: 1015-1057 + 1430-1525 PT Individual Time Calculation (min): 42 min  + 55 min  Short Term Goals: Week 1:  PT Short Term Goal 1 (Week 1): STG = LTG due Tto ELOS  Skilled Therapeutic Interventions/Progress Updates:      1st session: Pt sitting in recliner - agreeable to therapy session. No reports of pain but does report feeling "weak" and "tired." Pt confirms his friend, Annia Friendly, is coming this PM for family ed/training.   Sit<>stand to RW with supervision. Ambulates with supervision and RW from his room to main rehab gym, ~112ft. Downward gaze and slow gait speed - stops ambulating to talk or turn his head.   Seated rest break in rehab gym before initiating stairs. Patient navigated up/down x12, 6" steps, with CGA to SBA and 2 hand rails. Reciprocal stepping for ascent and step-to for descent. No LOB and fair safety awareness. Patient reporting increased fatigue and feeling "foggy."   Returned to sitting at edge of mat in rehab gym and vitals assessed:: BP 104/52 HR 69 while sitting BP 99/62 HR 70 while standing *Pt symptomatic, reports feeling "dizzy" and "weak"  Pt ambulated back to his room, ~168ft, with CGA and RW withi similar gait deficits as above. Communicated with LPN of patient symptoms and vitals. Direct handoff to nursing student with patient sitting in recliner with seat belt alarm on and all needs within reach.    2nd session: Pt sitting in recliner with his son, [name], at the bedside. His friend, Annia Friendly, arrives later in session for family ed/training. Pt denies any pain. He reports feeling ready to DC home and is hoping he can DC tomorrow. Spoke with care team via secure chat and all on board for DC tomorrow - relayed to patient.   Reviewed PT goals, PT POC, DME rec's, follow up therapies, home safety, fall  prevention, and general DC planning.   Sit<>stand to RW mod I from recliner. Pt ambulates at supervision level with RW from his room to ortho rehab gym, ~153ft. Reviewed car transfer with car height replicating their pick-up truck. Patient able to enter/exit vehicle with CGA for balance while he side steps into the car - car height too high for him to sit down first. Reviewed safety techniques and guarding for safe transfer.  Ambulated to main rehab gym, ~143ft, with supervision and RW - discussed tendency to lean to the R while ambulating and when he becomes fatigued or distracted. Stair training completed at 6" stairs using 2 hand rails as patient has these at the back door. Patient navigated up/down > 12 steps with supervision assist with reciprocal stepping for ascent and descent without LOB. Educated on pacing and awareness while he becomes fatigued from the stairs to improve insight.   Ambulated back to his room at supervision level with RW and ended the session seated in recliner with seat belt alarm on. Educated on the importance of using RW at home and in the community to reduce falls risk and pt voiced understanding and agreement.       Therapy Documentation Precautions:  Precautions Precautions: Fall Restrictions Weight Bearing Restrictions: No General:     Therapy/Group: Individual Therapy  [name] 10/11/2022, 7:47 AM

## 2022-10-11 NOTE — Progress Notes (Signed)
Occupational Therapy Session Note  Patient Details  Name: [name] MRN: [id number] Date of Birth: [birth year]  Today's Date: 10/11/2022 OT Individual Time: 4098-1191 OT Individual Time Calculation (min): 62 min    Short Term Goals: Week 1:  OT Short Term Goal 1 (Week 1): STG = LTG due to LOS  Skilled Therapeutic Interventions/Progress Updates:    Patient received seated in wheelchair just back from xray.  Patient agreeable to OT session and indicates he had a good night's sleep - "best yet."  Patient asking to have IV removed.  Spoke with MD and she wrote orders for IV removal.  Patient declined shower this am, opting to wash up at sink.  Working with patient to improve autonomy, and increase independence/ problem solving for BADL.   Patient showing improved stand tolerance this session.  Walked to tub room to practice shower stall transfer in simulated shower stall.  Patient reports he will not use walker at discharge.  Walks without walker with min assist to facilitation to maintain upright posture in midline.   Left up in recliner with safety belt in place and engaged and call bell/ personal items in reach.    Therapy Documentation Precautions:  Precautions Precautions: Fall Restrictions Weight Bearing Restrictions: No  Pain: Pain Assessment Pain Scale: 0-10 Pain Score: 0-No pain    Therapy/Group: Individual Therapy  [name] 10/11/2022, 12:03 PM

## 2022-10-11 NOTE — Discharge Summary (Signed)
Physician Discharge Summary  Patient ID: [name] MRN: [id number] DOB/AGE: May 12, 1934 88 y.o.  Admit date: 10/06/2022 Discharge date: 10/12/2022  Discharge Diagnoses:  Principal Problem:   Ischemic cerebrovascular accident (CVA) [facility]) DVT prophylaxis Diabetes mellitus Hypertension AKI Atrial fibrillation/CAD/CABG Hyperlipidemia BPH Obesity Constipation  Discharged Condition: Stable  Significant Diagnostic Studies: DG Chest 2 View  Result Date: 10/11/2022 CLINICAL DATA:  Leukocytosis EXAM: CHEST - 2 VIEW COMPARISON:  03/11/2017 FINDINGS: There is some bandlike changes in the left lung base. Atelectasis is favored. Minimal at the right lung base as well. No pneumothorax or edema. Normal cardiopericardial silhouette. Calcified aorta. Status post median sternotomy. Diffuse degenerative changes of the spine. Left lower chest loop recorder IMPRESSION: Basilar atelectasis on the left-greater-than-right. Postop chest. Loop recorder. Electronically Signed   By: Karen Kays M.D.   On: 10/11/2022 10:56   IR PERCUTANEOUS ART THROMBECTOMY/INFUSION INTRACRANIAL INC DIAG ANGIO  Result Date: 10/04/2022 INDICATION: [name] is an 88 year old male presenting with diplopia and right hemianopia; NIHSS 7. He developed diplopia around 7:45 PM on 10/01/2022, but time of onset of hemianopia is unclear since patient did not realize anterior examination. Therefore, he did not receive IV thrombolytic. Head CT showed no acute infarct or hemorrhage. CT angiogram of the head and neck showed a proximal left P1/PCA occlusion. CT perfusion showed no core infarct with a 19 mL penumbra. His past medical history significant for hypertension hyperlipidemia and diabetes; baseline modified Rankin scale 2. He was transferred emergently to our service for mechanical thrombectomy. EXAM: ULTRASOUND-GUIDED VASCULAR ACCESS DIAGNOSTIC CEREBRAL ANGIOGRAM MECHANICAL THROMBECTOMY FLAT PANEL HEAD CT COMPARISON:  CT/CT  angiogram of the head and neck October 01, 2022. MEDICATIONS: No antibiotics administered. ANESTHESIA/SEDATION: The procedure was performed under general anesthesia. CONTRAST:  40 mL Omnipaque 300 milligram/mL FLUOROSCOPY: Radiation Exposure Index (as provided by the fluoroscopic device): 777 mGy Kerma COMPLICATIONS: None immediate. TECHNIQUE: Informed written consent was obtained from the patient's daughter after a thorough discussion of the procedural risks, benefits and alternatives. All questions were addressed. Maximal Sterile Barrier Technique was utilized including caps, mask, sterile gowns, sterile gloves, sterile drape, hand hygiene and skin antiseptic. A timeout was performed prior to the initiation of the procedure. Using the modified Seldinger technique and a micropuncture kit, access was gained to the right radial artery at the wrist and a 7 French sheath was placed. Real-time ultrasound guidance was utilized for vascular access including the acquisition of a permanent ultrasound image documenting patency of the accessed vessel. Slow intra arterial infusion of 5 mg of verapamil diluted in patient's own blood was performed. No significant fluctuation in patient's blood pressure seen. Then, a right radial artery angiogram was obtained via sheath side port. Next, an exchange length 0.035 "Terumo Glidewire was advanced into the aortic arch under fluoroscopic guidance. The 7 French sheath was removed over the wire. Then, a zoom RDL guide catheter was advanced over the wire into the right subclavian artery. The inner dilator was removed and a 6 [place] Berenstein 2 catheter was advanced through the guide catheter into the right subclavian artery. Frontal and lateral angiograms of the neck were obtained. Using road map guidance, the catheter was then advanced over the wire into the right vertebral artery. The guide catheter was advanced to the distal V2 segment of the right vertebral artery. Frontal and lateral  angiograms of the head were obtained. FINDINGS: 1. Normal brachial artery branching pattern seen. No significant anatomical variation. The right radial artery caliber is adequate for vascular  access. 2. Occlusion of the proximal left P1/PCA with clot protruding into the basilar apex. 3. Atherosclerotic changes of the intracranial vertebral arteries and basilar artery with 55% stenosis at the distal V4 segment of the left vertebral artery. PROCEDURE: Using biplane roadmap guidance, a Zoom 55 aspiration catheter was navigated over Colossus 35 microguidewire into the basilar artery. The aspiration catheter was then advanced to the level of occlusion in the left P1 and connected to an aspiration pump. Continuous aspiration was performed for 1.5 minutes. The guide catheter was connected to a VacLok syringe. The aspiration catheter was subsequently removed under constant aspiration. The guide catheter was aspirated for debris. Right vertebral artery angiograms were obtained with frontal and lateral views of the head showing complete recanalization of the left PCA. Atherosclerotic changes with moderate stenosis at the P2 segment was noted. Flat panel CT of the head was obtained and post processed in a separate workstation with concurrent attending physician supervision. Selected images were sent to PACS. No evidence of hemorrhagic complication. The catheter was subsequently withdrawn. An inflatable band was placed and inflated over the right wrist access site. The vascular sheath was withdrawn and the band was slowly deflated until brisk flow was noted through the arteriotomy site. At this point, the band was reinflated with additional 4 cc of air to obtain patent hemostasis. IMPRESSION: Successful and uncomplicated mechanical thrombectomy for treatment of a proximal left P1/PCA occlusion achieving complete recanalization after 1 pass (TICI 3). No thromboembolic or hemorrhagic complication. PLAN: Patient transferred to ICU  for observation. Electronically Signed   By: Baldemar Lenis M.D.   On: 10/04/2022 14:50   IR CT Head Ltd  Result Date: 10/04/2022 INDICATION: [name] is an 88 year old male presenting with diplopia and right hemianopia; NIHSS 7. He developed diplopia around 7:45 PM on 10/01/2022, but time of onset of hemianopia is unclear since patient did not realize anterior examination. Therefore, he did not receive IV thrombolytic. Head CT showed no acute infarct or hemorrhage. CT angiogram of the head and neck showed a proximal left P1/PCA occlusion. CT perfusion showed no core infarct with a 19 mL penumbra. His past medical history significant for hypertension hyperlipidemia and diabetes; baseline modified Rankin scale 2. He was transferred emergently to our service for mechanical thrombectomy. EXAM: ULTRASOUND-GUIDED VASCULAR ACCESS DIAGNOSTIC CEREBRAL ANGIOGRAM MECHANICAL THROMBECTOMY FLAT PANEL HEAD CT COMPARISON:  CT/CT angiogram of the head and neck October 01, 2022. MEDICATIONS: No antibiotics administered. ANESTHESIA/SEDATION: The procedure was performed under general anesthesia. CONTRAST:  40 mL Omnipaque 300 milligram/mL FLUOROSCOPY: Radiation Exposure Index (as provided by the fluoroscopic device): 777 mGy Kerma COMPLICATIONS: None immediate. TECHNIQUE: Informed written consent was obtained from the patient's daughter after a thorough discussion of the procedural risks, benefits and alternatives. All questions were addressed. Maximal Sterile Barrier Technique was utilized including caps, mask, sterile gowns, sterile gloves, sterile drape, hand hygiene and skin antiseptic. A timeout was performed prior to the initiation of the procedure. Using the modified Seldinger technique and a micropuncture kit, access was gained to the right radial artery at the wrist and a 7 French sheath was placed. Real-time ultrasound guidance was utilized for vascular access including the acquisition of a permanent  ultrasound image documenting patency of the accessed vessel. Slow intra arterial infusion of 5 mg of verapamil diluted in patient's own blood was performed. No significant fluctuation in patient's blood pressure seen. Then, a right radial artery angiogram was obtained via sheath side port. Next, an exchange length  0.035 "Terumo Glidewire was advanced into the aortic arch under fluoroscopic guidance. The 7 French sheath was removed over the wire. Then, a zoom RDL guide catheter was advanced over the wire into the right subclavian artery. The inner dilator was removed and a 6 [place] Berenstein 2 catheter was advanced through the guide catheter into the right subclavian artery. Frontal and lateral angiograms of the neck were obtained. Using road map guidance, the catheter was then advanced over the wire into the right vertebral artery. The guide catheter was advanced to the distal V2 segment of the right vertebral artery. Frontal and lateral angiograms of the head were obtained. FINDINGS: 1. Normal brachial artery branching pattern seen. No significant anatomical variation. The right radial artery caliber is adequate for vascular access. 2. Occlusion of the proximal left P1/PCA with clot protruding into the basilar apex. 3. Atherosclerotic changes of the intracranial vertebral arteries and basilar artery with 55% stenosis at the distal V4 segment of the left vertebral artery. PROCEDURE: Using biplane roadmap guidance, a Zoom 55 aspiration catheter was navigated over Colossus 35 microguidewire into the basilar artery. The aspiration catheter was then advanced to the level of occlusion in the left P1 and connected to an aspiration pump. Continuous aspiration was performed for 1.5 minutes. The guide catheter was connected to a VacLok syringe. The aspiration catheter was subsequently removed under constant aspiration. The guide catheter was aspirated for debris. Right vertebral artery angiograms were obtained with frontal  and lateral views of the head showing complete recanalization of the left PCA. Atherosclerotic changes with moderate stenosis at the P2 segment was noted. Flat panel CT of the head was obtained and post processed in a separate workstation with concurrent attending physician supervision. Selected images were sent to PACS. No evidence of hemorrhagic complication. The catheter was subsequently withdrawn. An inflatable band was placed and inflated over the right wrist access site. The vascular sheath was withdrawn and the band was slowly deflated until brisk flow was noted through the arteriotomy site. At this point, the band was reinflated with additional 4 cc of air to obtain patent hemostasis. IMPRESSION: Successful and uncomplicated mechanical thrombectomy for treatment of a proximal left P1/PCA occlusion achieving complete recanalization after 1 pass (TICI 3). No thromboembolic or hemorrhagic complication. PLAN: Patient transferred to ICU for observation. Electronically Signed   By: Baldemar Lenis M.D.   On: 10/04/2022 14:50   IR US Guide Vasc Access Right  Result Date: 10/04/2022 INDICATION: [name] is an 88 year old male presenting with diplopia and right hemianopia; NIHSS 7. He developed diplopia around 7:45 PM on 10/01/2022, but time of onset of hemianopia is unclear since patient did not realize anterior examination. Therefore, he did not receive IV thrombolytic. Head CT showed no acute infarct or hemorrhage. CT angiogram of the head and neck showed a proximal left P1/PCA occlusion. CT perfusion showed no core infarct with a 19 mL penumbra. His past medical history significant for hypertension hyperlipidemia and diabetes; baseline modified Rankin scale 2. He was transferred emergently to our service for mechanical thrombectomy. EXAM: ULTRASOUND-GUIDED VASCULAR ACCESS DIAGNOSTIC CEREBRAL ANGIOGRAM MECHANICAL THROMBECTOMY FLAT PANEL HEAD CT COMPARISON:  CT/CT angiogram of the head and  neck October 01, 2022. MEDICATIONS: No antibiotics administered. ANESTHESIA/SEDATION: The procedure was performed under general anesthesia. CONTRAST:  40 mL Omnipaque 300 milligram/mL FLUOROSCOPY: Radiation Exposure Index (as provided by the fluoroscopic device): 777 mGy Kerma COMPLICATIONS: None immediate. TECHNIQUE: Informed written consent was obtained from the patient's daughter after a  thorough discussion of the procedural risks, benefits and alternatives. All questions were addressed. Maximal Sterile Barrier Technique was utilized including caps, mask, sterile gowns, sterile gloves, sterile drape, hand hygiene and skin antiseptic. A timeout was performed prior to the initiation of the procedure. Using the modified Seldinger technique and a micropuncture kit, access was gained to the right radial artery at the wrist and a 7 French sheath was placed. Real-time ultrasound guidance was utilized for vascular access including the acquisition of a permanent ultrasound image documenting patency of the accessed vessel. Slow intra arterial infusion of 5 mg of verapamil diluted in patient's own blood was performed. No significant fluctuation in patient's blood pressure seen. Then, a right radial artery angiogram was obtained via sheath side port. Next, an exchange length 0.035 "Terumo Glidewire was advanced into the aortic arch under fluoroscopic guidance. The 7 French sheath was removed over the wire. Then, a zoom RDL guide catheter was advanced over the wire into the right subclavian artery. The inner dilator was removed and a 6 [place] Berenstein 2 catheter was advanced through the guide catheter into the right subclavian artery. Frontal and lateral angiograms of the neck were obtained. Using road map guidance, the catheter was then advanced over the wire into the right vertebral artery. The guide catheter was advanced to the distal V2 segment of the right vertebral artery. Frontal and lateral angiograms of the head were  obtained. FINDINGS: 1. Normal brachial artery branching pattern seen. No significant anatomical variation. The right radial artery caliber is adequate for vascular access. 2. Occlusion of the proximal left P1/PCA with clot protruding into the basilar apex. 3. Atherosclerotic changes of the intracranial vertebral arteries and basilar artery with 55% stenosis at the distal V4 segment of the left vertebral artery. PROCEDURE: Using biplane roadmap guidance, a Zoom 55 aspiration catheter was navigated over Colossus 35 microguidewire into the basilar artery. The aspiration catheter was then advanced to the level of occlusion in the left P1 and connected to an aspiration pump. Continuous aspiration was performed for 1.5 minutes. The guide catheter was connected to a VacLok syringe. The aspiration catheter was subsequently removed under constant aspiration. The guide catheter was aspirated for debris. Right vertebral artery angiograms were obtained with frontal and lateral views of the head showing complete recanalization of the left PCA. Atherosclerotic changes with moderate stenosis at the P2 segment was noted. Flat panel CT of the head was obtained and post processed in a separate workstation with concurrent attending physician supervision. Selected images were sent to PACS. No evidence of hemorrhagic complication. The catheter was subsequently withdrawn. An inflatable band was placed and inflated over the right wrist access site. The vascular sheath was withdrawn and the band was slowly deflated until brisk flow was noted through the arteriotomy site. At this point, the band was reinflated with additional 4 cc of air to obtain patent hemostasis. IMPRESSION: Successful and uncomplicated mechanical thrombectomy for treatment of a proximal left P1/PCA occlusion achieving complete recanalization after 1 pass (TICI 3). No thromboembolic or hemorrhagic complication. PLAN: Patient transferred to ICU for observation.  Electronically Signed   By: Baldemar Lenis M.D.   On: 10/04/2022 14:50   ECHOCARDIOGRAM COMPLETE  Result Date: 10/02/2022    ECHOCARDIOGRAM REPORT   Patient Name:   [name] Date of Exam: 10/02/2022 Medical Rec #:  [id number]        Height:       69.0 in Accession #:    [id number]  Weight:       175.0 lb Date of Birth:  1934/09/06        BSA:          1.952 m Patient Age:    88 years         BP:           135/71 mmHg Patient Gender: M                HR:           74 bpm. Exam Location:  Inpatient Procedure: 2D Echo, Cardiac Doppler, Color Doppler and Intracardiac            Opacification Agent Indications:    Stroke  History:        Patient has prior history of Echocardiogram examinations, most                 recent 11/18/2019.  Sonographer:    Sheralyn Boatman RDCS Referring Phys: [phone] MCNEILL P [facility]  Sonographer Comments: Technically difficult study due to poor echo windows. Image acquisition challenging due to patient body habitus. Study delayed for patient care. IMPRESSIONS  1. Left ventricular ejection fraction, by estimation, is 60 to 65%. The left ventricle has normal function. The left ventricle has no regional wall motion abnormalities. Left ventricular diastolic parameters are indeterminate.  2. Right ventricular systolic function is mildly reduced. The right ventricular size is normal. Tricuspid regurgitation signal is inadequate for assessing PA pressure. The estimated right ventricular systolic pressure is 37.8 mmHg.  3. Left atrial size was mild to moderately dilated.  4. The mitral valve is degenerative. Mild mitral valve regurgitation. No evidence of mitral stenosis.  5. The aortic valve is grossly normal. There is mild calcification of the aortic valve. There is mild thickening of the aortic valve. Aortic valve regurgitation is not visualized. No aortic stenosis is present.  6. The inferior vena cava is dilated in size with <50% respiratory variability, suggesting right  atrial pressure of 15 mmHg. FINDINGS  Left Ventricle: Left ventricular ejection fraction, by estimation, is 60 to 65%. The left ventricle has normal function. The left ventricle has no regional wall motion abnormalities. Definity contrast agent was given IV to delineate the left ventricular  endocardial borders. The left ventricular internal cavity size was normal in size. There is no left ventricular hypertrophy. Left ventricular diastolic parameters are indeterminate. Right Ventricle: The right ventricular size is normal. No increase in right ventricular wall thickness. Right ventricular systolic function is mildly reduced. Tricuspid regurgitation signal is inadequate for assessing PA pressure. The tricuspid regurgitant velocity is 2.39 m/s, and with an assumed right atrial pressure of 15 mmHg, the estimated right ventricular systolic pressure is 37.8 mmHg. Left Atrium: Left atrial size was mild to moderately dilated. Right Atrium: Right atrial size was normal in size. Pericardium: There is no evidence of pericardial effusion. Presence of epicardial fat layer. Mitral Valve: The mitral valve is degenerative in appearance. There is mild calcification of the mitral valve leaflet(s). Mild mitral annular calcification. Mild mitral valve regurgitation. No evidence of mitral valve stenosis. Tricuspid Valve: The tricuspid valve is normal in structure. Tricuspid valve regurgitation is trivial. No evidence of tricuspid stenosis. Aortic Valve: The aortic valve is grossly normal. There is mild calcification of the aortic valve. There is mild thickening of the aortic valve. Aortic valve regurgitation is not visualized. No aortic stenosis is present. Pulmonic Valve: The pulmonic valve was normal in structure. Pulmonic valve regurgitation is not  visualized. No evidence of pulmonic stenosis. Aorta: The aortic root is normal in size and structure. Venous: The inferior vena cava is dilated in size with less than 50% respiratory  variability, suggesting right atrial pressure of 15 mmHg. IAS/Shunts: No atrial level shunt detected by color flow Doppler.  LEFT VENTRICLE PLAX 2D LVIDd:         4.00 cm   Diastology LVIDs:         2.40 cm   LV e' medial:    7.62 cm/s LV PW:         1.10 cm   LV E/e' medial:  16.0 LV IVS:        1.10 cm   LV e' lateral:   8.38 cm/s LVOT diam:     2.40 cm   LV E/e' lateral: 14.6 LV SV:         74 LV SV Index:   38 LVOT Area:     4.52 cm  RIGHT VENTRICLE            IVC RV S prime:     4.57 cm/s  IVC diam: 2.10 cm TAPSE (M-mode): 0.5 cm LEFT ATRIUM             Index        RIGHT ATRIUM           Index LA diam:        4.30 cm 2.20 cm/m   RA Area:     10.20 cm LA Vol (A2C):   38.7 ml 19.83 ml/m  RA Volume:   21.30 ml  10.91 ml/m LA Vol (A4C):   44.7 ml 22.90 ml/m LA Biplane Vol: 45.2 ml 23.16 ml/m  AORTIC VALVE LVOT Vmax:   81.70 cm/s LVOT Vmean:  53.100 cm/s LVOT VTI:    0.164 m  AORTA Ao Root diam: 3.00 cm Ao Asc diam:  3.50 cm MITRAL VALVE                TRICUSPID VALVE MV Area (PHT): 4.15 cm     TR Peak grad:   22.8 mmHg MV Decel Time: 183 msec     TR Vmax:        239.00 cm/s MV E velocity: 122.00 cm/s                             SHUNTS                             Systemic VTI:  0.16 m                             Systemic Diam: 2.40 cm Weston Brass MD Electronically signed by Weston Brass MD Signature Date/Time: 10/02/2022/3:39:50 PM    Final    MR BRAIN WO CONTRAST  Result Date: 10/02/2022 CLINICAL DATA:  88 year old male code stroke presentation yesterday, short segment left P1 occlusion and clot protruding into the distal basilar artery. Status post mechanical thrombectomy. EXAM: MRI HEAD WITHOUT CONTRAST TECHNIQUE: Multiplanar, multiecho pulse sequences of the brain and surrounding structures were obtained without intravenous contrast. COMPARISON:  CT head, CTA and CTP yesterday. FINDINGS: Brain: Patchy restricted diffusion in both cerebellar hemispheres, more so on the left at the AICA and/or  SCA territories (series 2, image 15). Subtle small focus of restricted diffusion in the left superior pons  just below the midbrain best seen on series 3, image 17. No other convincing brainstem restricted diffusion. Similar small focus in the left lateral thalamus series 2, image 28. And there is an adjacent chronic left thalamic lacunar infarct. DWI edge artifact throughout the cerebral hemispheres, but there could be small foci of abnormal occipital pole diffusion, possibly also some right occipital white matter diffusion restriction on series 2, image 19. Subtle T2/FLAIR hyperintensity at the affected areas. No hemorrhagic transformation or mass effect. Nonspecific ventriculomegaly which might be cerebral volume loss related. Nomidline shift, mass effect, evidence of mass lesion, ventriculomegaly, extra-axial collection or acute intracranial hemorrhage. Cervicomedullary junction and pituitary are within normal limits. Patchy and confluent bilateral cerebral white matter T2 and FLAIR hyperintensity. Chronic left thalamic lacunar infarct as stated above, otherwise deep gray nuclei T2 heterogeneity appears mostly due to perivascular spaces. Chronic microhemorrhages also in both thalami on SWI, and in the right corona radiata. Vascular: Major intracranial vascular flow voids are preserved. Skull and upper cervical spine: Negative for age visible cervical spine. Visualized bone marrow signal is within normal limits. Sinuses/Orbits: Postoperative changes to both globes. Paranasal sinus mucoperiosteal thickening again noted. Other: Mastoids are well aerated. Grossly normal visible internal auditory structures. IMPRESSION: 1. Small acute infarcts scattered in the bilateral cerebellum (left greater than right). Punctate acute lacunar infarcts in both the left pons and thalamus. And minimal occipital lobe restricted diffusion, perhaps greater on the right. 2. No hemorrhagic transformation or intracranial mass effect. 3.  Underlying chronic small vessel disease, including chronic thalamic microhemorrhages, and nonspecific ventricular enlargement. Electronically Signed   By: Odessa Fleming M.D.   On: 10/02/2022 05:46   CT CEREBRAL PERFUSION W CONTRAST  Result Date: 10/01/2022 CLINICAL DATA:  Diplopia, stroke suspected EXAM: CT ANGIOGRAPHY HEAD AND NECK CT PERFUSION BRAIN TECHNIQUE: Multidetector CT imaging of the head and neck was performed using the standard protocol during bolus administration of intravenous contrast. Multiplanar CT image reconstructions and MIPs were obtained to evaluate the vascular anatomy. Carotid stenosis measurements (when applicable) are obtained utilizing NASCET criteria, using the distal internal carotid diameter as the denominator. Multiphase CT imaging of the brain was performed following IV bolus contrast injection. Subsequent parametric perfusion maps were calculated using RAPID software. RADIATION DOSE REDUCTION: This exam was performed according to the departmental dose-optimization program which includes automated exposure control, adjustment of the mA and/or kV according to patient size and/or use of iterative reconstruction technique. CONTRAST:  OMNIPAQUE IOHEXOL 350 MG/ML SOLN COMPARISON:  No prior CTA available, correlation is made with CT head 10/01/2022 FINDINGS: CT HEAD FINDINGS For noncontrast findings, please see same day CT head. CTA NECK FINDINGS Aortic arch: Four-vessel arch, with the left vertebral artery originating from the aorta. Imaged portion shows no evidence of aneurysm or dissection. No significant stenosis of the major arch vessel origins. Aortic atherosclerosis, with significant noncalcified and possibly ulcerated plaque. Right carotid system: No evidence of dissection, occlusion, or hemodynamically significant stenosis (greater than 50%). Retropharyngeal course of the proximal right ICA. Left carotid system: No evidence of dissection, occlusion, or hemodynamically  significant stenosis (greater than 50%). Vertebral arteries: No evidence of dissection, occlusion, or hemodynamically significant stenosis (greater than 50%). Skeleton: No acute osseous abnormality. Degenerative changes in the cervical spine. Status post median sternotomy. Other neck: No acute finding. Upper chest: No focal pulmonary opacity or pleural effusion. Review of the MIP images confirms the above findings CTA HEAD FINDINGS Anterior circulation: Both internal carotid arteries are patent to the termini,  with mild stenosis in the left cavernous segment, severe stenosis in the left supraclinoid segment proximally, and moderate stenosis in the right supraclinoid segment. A1 segments patent. Normal anterior communicating artery. Anterior cerebral arteries are patent to their distal aspects without significant stenosis. No M1 stenosis or occlusion. MCA branches perfused to their distal aspects without significant stenosis. Posterior circulation: Vertebral arteries patent to the vertebrobasilar junction without significant stenosis. Severe stenosis at the origin of the right PICA (series 7, image 136). The left PICA is patent. Basilar patent to its distal aspect with mild stenosis in the distal basilar (series 7, image 120). Superior cerebellar arteries patent proximally. Short segment occlusion of the proximal left P 1 segment (series 7, image 108). Additional moderate stenosis in the distal left P2 segment (series 7, image 112). Patent right P1 segment. The right PCA branches are patent to to their distal aspects with some irregularity but without significant stenosis. The right posterior communicating artery is patent. The left posterior communicating artery is not visualized. Venous sinuses: As permitted by contrast timing, patent. Anatomic variants: None significant. Review of the MIP images confirms the above findings CT Brain Perfusion Findings: ASPECTS: 10 CBF (<30%) Volume: 0mL Perfusion (Tmax>6.0s)  volume: 19mL Mismatch Volume: 19mL Infarction Location:No infarct core identified. Possible area of decreased perfusion in the superior left cerebellum, along the posterior and lateral aspects, or left occipital lobe. IMPRESSION: 1. Short segment occlusion of the proximal left P1 segment, with additional moderate stenosis in the distal left P2 segment. 2. No infarct core identified. Possible area of decreased perfusion in the superior left cerebellum or left occipital lobe. This could reflect decreased perfusion related to short segment left P1 occlusion. Superior cerebellar decreased perfusion 3. Severe stenosis in the left supraclinoid ICA and moderate stenosis in the right supraclinoid ICA. 4. Severe stenosis at the origin of the right PICA. Mild stenosis in the distal basilar artery. 5. No hemodynamically significant stenosis in the neck. 6. Aortic atherosclerosis. Aortic Atherosclerosis (ICD10-I70.0). These results were called by telephone at the time of interpretation on 10/01/2022 at 10:59 pm to provider [facility] , who verbally acknowledged these results. Electronically Signed   By: Wiliam Ke M.D.   On: 10/01/2022 22:59   CT ANGIO HEAD NECK W WO CM  Result Date: 10/01/2022 CLINICAL DATA:  Diplopia, stroke suspected EXAM: CT ANGIOGRAPHY HEAD AND NECK CT PERFUSION BRAIN TECHNIQUE: Multidetector CT imaging of the head and neck was performed using the standard protocol during bolus administration of intravenous contrast. Multiplanar CT image reconstructions and MIPs were obtained to evaluate the vascular anatomy. Carotid stenosis measurements (when applicable) are obtained utilizing NASCET criteria, using the distal internal carotid diameter as the denominator. Multiphase CT imaging of the brain was performed following IV bolus contrast injection. Subsequent parametric perfusion maps were calculated using RAPID software. RADIATION DOSE REDUCTION: This exam was performed according to the  departmental dose-optimization program which includes automated exposure control, adjustment of the mA and/or kV according to patient size and/or use of iterative reconstruction technique. CONTRAST:  OMNIPAQUE IOHEXOL 350 MG/ML SOLN COMPARISON:  No prior CTA available, correlation is made with CT head 10/01/2022 FINDINGS: CT HEAD FINDINGS For noncontrast findings, please see same day CT head. CTA NECK FINDINGS Aortic arch: Four-vessel arch, with the left vertebral artery originating from the aorta. Imaged portion shows no evidence of aneurysm or dissection. No significant stenosis of the major arch vessel origins. Aortic atherosclerosis, with significant noncalcified and possibly ulcerated plaque. Right carotid system: No  evidence of dissection, occlusion, or hemodynamically significant stenosis (greater than 50%). Retropharyngeal course of the proximal right ICA. Left carotid system: No evidence of dissection, occlusion, or hemodynamically significant stenosis (greater than 50%). Vertebral arteries: No evidence of dissection, occlusion, or hemodynamically significant stenosis (greater than 50%). Skeleton: No acute osseous abnormality. Degenerative changes in the cervical spine. Status post median sternotomy. Other neck: No acute finding. Upper chest: No focal pulmonary opacity or pleural effusion. Review of the MIP images confirms the above findings CTA HEAD FINDINGS Anterior circulation: Both internal carotid arteries are patent to the termini, with mild stenosis in the left cavernous segment, severe stenosis in the left supraclinoid segment proximally, and moderate stenosis in the right supraclinoid segment. A1 segments patent. Normal anterior communicating artery. Anterior cerebral arteries are patent to their distal aspects without significant stenosis. No M1 stenosis or occlusion. MCA branches perfused to their distal aspects without significant stenosis. Posterior circulation: Vertebral arteries patent  to the vertebrobasilar junction without significant stenosis. Severe stenosis at the origin of the right PICA (series 7, image 136). The left PICA is patent. Basilar patent to its distal aspect with mild stenosis in the distal basilar (series 7, image 120). Superior cerebellar arteries patent proximally. Short segment occlusion of the proximal left P 1 segment (series 7, image 108). Additional moderate stenosis in the distal left P2 segment (series 7, image 112). Patent right P1 segment. The right PCA branches are patent to to their distal aspects with some irregularity but without significant stenosis. The right posterior communicating artery is patent. The left posterior communicating artery is not visualized. Venous sinuses: As permitted by contrast timing, patent. Anatomic variants: None significant. Review of the MIP images confirms the above findings CT Brain Perfusion Findings: ASPECTS: 10 CBF (<30%) Volume: 0mL Perfusion (Tmax>6.0s) volume: 19mL Mismatch Volume: 19mL Infarction Location:No infarct core identified. Possible area of decreased perfusion in the superior left cerebellum, along the posterior and lateral aspects, or left occipital lobe. IMPRESSION: 1. Short segment occlusion of the proximal left P1 segment, with additional moderate stenosis in the distal left P2 segment. 2. No infarct core identified. Possible area of decreased perfusion in the superior left cerebellum or left occipital lobe. This could reflect decreased perfusion related to short segment left P1 occlusion. Superior cerebellar decreased perfusion 3. Severe stenosis in the left supraclinoid ICA and moderate stenosis in the right supraclinoid ICA. 4. Severe stenosis at the origin of the right PICA. Mild stenosis in the distal basilar artery. 5. No hemodynamically significant stenosis in the neck. 6. Aortic atherosclerosis. Aortic Atherosclerosis (ICD10-I70.0). These results were called by telephone at the time of interpretation on  10/01/2022 at 10:59 pm to provider [facility] , who verbally acknowledged these results. Electronically Signed   By: Wiliam Ke M.D.   On: 10/01/2022 22:59   CT HEAD CODE STROKE WO CONTRAST  Result Date: 10/01/2022 CLINICAL DATA:  Code stroke.  Double vision EXAM: CT HEAD WITHOUT CONTRAST TECHNIQUE: Contiguous axial images were obtained from the base of the skull through the vertex without intravenous contrast. RADIATION DOSE REDUCTION: This exam was performed according to the departmental dose-optimization program which includes automated exposure control, adjustment of the mA and/or kV according to patient size and/or use of iterative reconstruction technique. COMPARISON:  03/11/2017 FINDINGS: Brain: No evidence of acute infarction, hemorrhage, mass, mass effect, or midline shift. No hydrocephalus or extra-axial collection. Age related cerebral atrophy. Ex vacuo dilatation of the ventricles. Periventricular white matter changes, likely the sequela of chronic small  vessel ischemic disease. Vascular: No hyperdense vessel. Skull: Negative for fracture or focal lesion. Sinuses/Orbits: Chronic bilateral maxillary sinusitis. Mucosal thickening in the ethmoid air cells. Status post bilateral lens replacements. Dysconjugate gaze Other: The mastoid air cells are well aerated. ASPECTS [facility] Stroke Program Early CT Score) - Ganglionic level infarction (caudate, lentiform nuclei, internal capsule, insula, M1-M3 cortex): 7 - Supraganglionic infarction (M4-M6 cortex): 3 Total score (0-10 with 10 being normal): 10 IMPRESSION: 1. No acute intracranial process. 2. ASPECTS is 10. Code stroke imaging results were communicated on 10/01/2022 at 9:23 pm to provider [facility] via telephone, who verbally acknowledged these results. Electronically Signed   By: Wiliam Ke M.D.   On: 10/01/2022 21:24    Labs:  Basic Metabolic Panel: Recent Labs  Lab 10/06/22 0347 10/07/22 0758 10/09/22 0641 10/10/22 0558  NA 137  138 135 133*  K 3.5 3.8 3.6 3.5  CL 103 100 103 101  CO2 26 27 23 22   GLUCOSE 134* 158* 129* 155*  BUN 23 23 20 18   CREATININE 1.25* 1.34* 1.47* 1.48*  CALCIUM 9.2 9.3 8.7* 8.5*    CBC: Recent Labs  Lab 10/07/22 0758 10/10/22 0558 10/10/22 0924 10/11/22 0610  WBC 7.7 16.3* 16.5* 13.1*  NEUTROABS 5.2  --  12.7* 10.1*  HGB 13.2 13.0 12.7* 12.3*  HCT 38.0* 38.1* 38.3* 35.9*  MCV 94.3 95.5 95.3 96.2  PLT 180 193 219 216    CBG: Recent Labs  Lab 10/09/22 2129 10/10/22 0601 10/10/22 1138 10/10/22 1636 10/11/22 0612  GLUCAP 217* 165* 226* 153* 147*   Family history.  Mother with myocardial infarction father with clotting disorder.  Denies any colon cancer esophageal cancer or rectal cancer  Brief HPI:   [name] is a 88 y.o. right-handed male with history of diabetes mellitus obesity with BMI 25.84 atrial flutter with history of loop recorder hypertension hyperlipidemia CAD with CABG maintained on low-dose aspirin.  Per chart review lives alone independent prior to admission.  Presented 10/01/2022 with diplopia and difficulty with balance.  Admission chemistries unremarkable except BUN 28 creatinine 1.39 hemoglobin A1c 8.4.  Cranial CT scan negative.  CTA/CT cerebral perfusion scan showed short segment occlusion of the proximal left P1 segment with additional moderate stenosis in the distal left P2 segment.  Severe stenosis left supraclinoid ICA and moderate stenosis of the right supraclinoid ICA.  Severe stenosis at the origin of the right PICA.  Patient underwent mechanical thrombectomy with complete recanalization per interventional radiology.  MRI follow-up revealed scattered small acute infarcts in the bilateral cerebellum as well as left pons and thalamus with minimal occipital lobe restricted diffusion right greater than left.  Echocardiogram with ejection fraction of 60 to 65% no wall motion abnormalities.  Neurology follow-up maintained on Eliquis as well as Plavix after  discussed with neurology services as well as cardiology services.  Therapy evaluations completed due to patient decreased functional mobility was admitted for a comprehensive rehab program.   Hospital Course: [name] was admitted to rehab 10/06/2022 for inpatient therapies to consist of PT, ST and OT at least three hours five days a week. Past admission physiatrist, therapy team and rehab RN have worked together to provide customized collaborative inpatient rehab.  Pertaining to patient's multiple scattered bilateral ischemic infarct status post thrombectomy of left P1/PCA.  Patient remained on Eliquis as well as Plavix after discussion with neurology as well as cardiology services.  Blood pressure remained controlled on Sectral as well as Cialis and Benicar.  Patient did have some mild AKI hydrochlorothiazide was discontinued with latest creatinine 1.48.  Blood sugars monitored hemoglobin A1c 8.4 patient on Glucophage as well as Ozempic and Jardiance prior to admission.  His Glucophage remained on hold due to mild AKI.  Patient reports he stopped his Ozempic a month ago.  Atrial fibrillation with history of CAD as well as bypass surgery status post loop recorder followed by cardiology services cardiac rate controlled.  Zetia as well as Lipitor ongoing for hyperlipidemia.  History of BPH maintained on Flomax.  Obesity BMI 25.84 with dietary follow-up.  Bouts of constipation resolved with laxative assistance.   Blood pressures were monitored on TID basis and controlled  Diabetes has been monitored with ac/hs CBG checks and SSI was use prn for tighter BS control.    Rehab course: During patient's stay in rehab weekly team conferences were held to monitor patient's progress, set goals and discuss barriers to discharge. At admission, patient required minimal assist 80 feet 1 person hand-held assist minimal assist sit to stand  Physical exam.  Blood pressure 148/72 pulse 70 temperature 97.8  respirations 16 oxygen saturation is 96% room air Constitutional.  No acute distress HEENT Head.  Normocephalic and atraumatic Eyes.  Pupils round and reactive to light no discharge without nystagmus Neck.  Supple nontender no JVD without thyromegaly Cardiac regular rate and rhythm without any extra sounds or murmur heard Abdomen.  Soft nontender positive bowel sounds without rebound Respiratory effort normal no respiratory distress without wheeze Musculoskeletal.  Strength 5 out of 5 except 4+/5 right upper extremity SAN 4 -/5 left upper extremity SA Neurologic.  Alert and oriented x 3  He/She  has had improvement in activity tolerance, balance, postural control as well as ability to compensate for deficits. He/She has had improvement in functional use RUE/LUE  and RLE/LLE as well as improvement in awareness.  Sit to stand rolling walker supervision.  Ambulates supervision with a rolling walker 165 feet.  Sit to stand rolling walker modified independent.  Able to enter and exit vehicle with contact-guard assist for balance.  Stair negotiation with contact-guard assist.  Patient gather his belongings for activities day living and homemaking.  Full family teaching completed plan discharge to home       Disposition: Discharge to home    Diet: Diabetic diet  Special Instructions: No driving smoking or alcohol  Medications at discharge 1.  Tylenol as needed 2.  Eliquis 2.5 mg p.o. twice daily 3.  Lipitor 80 mg p.o. daily 4.  Plavix 75 mg p.o. daily 5.  Jardiance 25 mg p.o. daily 6.  Zetia 10 mg p.o. every morning 7.  Myrbetriq 50 mg p.o. daily 8.  Benicar 20 mg p.o. daily 9.  Cialis 5 mg p.o. daily 10.  Flomax 0.4 mg p.o. daily 11.  Sectral 200 mg p.o. daily 12.  Tums 200 mg daily after supper 13.Saxagliptin-metformin 08-998 mg daily   Discharge Instructions     Ambulatory referral to Neurology   Complete by: As directed    An appointment is requested in approximately: 4  weeks bilateral ischemic infarction        Follow-up Information     Raulkar, Drema Pry, MD Follow up.   Specialty: Physical Medicine and Rehabilitation Why: Office to call for appointment Contact information: [address] [place] [address] [phone]         Yates Decamp, MD Follow up.   Specialty: Cardiology Why: Call for appointment Contact information:  [address] [place] [address] [phone]         Baldemar Lenis, MD Follow up.   Specialties: Radiology, Interventional Radiology Why: Call for appointment Contact information: [address] [place] [address] [phone]                 Signed: Charlton Amor 10/11/2022, 4:22 PM

## 2022-10-11 NOTE — Progress Notes (Signed)
Occupational Therapy Session Note  Patient Details  Name: [name] MRN: [id number] Date of Birth: [birth year]  Today's Date: 10/11/2022 OT Individual Time: 9562-1308 OT Individual Time Calculation (min): 42 min    Short Term Goals: Week 1:  OT Short Term Goal 1 (Week 1): STG = LTG due to LOS  Skilled Therapeutic Interventions/Progress Updates:    Patient's son and grandson here for family education this afternoon.  Patient's friend Annia Friendly not here, and when called indicates he will be here by 2:30.  Patient's son indicates he did not know Annia Friendly wa part of the discharge plan - indicating there are five siblings that all live in close proximity.   Demonstrated patient walking with and without walker.  Son able to see benefit of walker, and indicates patient will not use outside of the house, and that patient has had multiple falls.  Patient significantly less stable without walker - explained why therapy would work with and without walker to challenge/ improve balance.  Demonstrated and practiced shower stall transfer with close supervision.  Explained why full supervision recommended initially when patient returns home.  Patient tearful at times this session, stating - "I have a great family.  Sometimes I forget it."   Son requesting OP Neuro rehab to address vestibular and balance issues which are longstanding.   Family/ Patient's questions asked and answered.  Patient left up in recliner with safety belt in place and engaged, call bell and personal items in reach.    Therapy Documentation Precautions:  Precautions Precautions: Fall Restrictions Weight Bearing Restrictions: No   Pain: Denies pain     Therapy/Group: Individual Therapy  [name] 10/11/2022, 2:12 PM

## 2022-10-12 LAB — GLUCOSE, CAPILLARY: Glucose-Capillary: 105 mg/dL — ABNORMAL HIGH (ref 70–99)

## 2022-10-12 MED ORDER — ATORVASTATIN CALCIUM 80 MG PO TABS: 80.0000 mg | ORAL_TABLET | Freq: Every day | ORAL | 0 refills | Status: DC

## 2022-10-12 MED ORDER — CLOPIDOGREL BISULFATE 75 MG PO TABS: 75.0000 mg | ORAL_TABLET | Freq: Every day | ORAL | 0 refills | Status: DC

## 2022-10-12 MED ORDER — ZETIA 10 MG PO TABS: 10.0000 mg | ORAL_TABLET | Freq: Every morning | ORAL | 0 refills | Status: DC

## 2022-10-12 MED ORDER — EMPAGLIFLOZIN 25 MG PO TABS: 25.0000 mg | ORAL_TABLET | Freq: Every day | ORAL | 0 refills | Status: AC

## 2022-10-12 MED ORDER — ACEBUTOLOL HCL 200 MG PO CAPS
200.0000 mg | ORAL_CAPSULE | Freq: Two times a day (BID) | ORAL | 1 refills | Status: DC
Start: 2022-10-12 — End: 2023-02-05

## 2022-10-12 MED ORDER — OLMESARTAN MEDOXOMIL 20 MG PO TABS: 20.0000 mg | ORAL_TABLET | Freq: Every day | ORAL | 0 refills | Status: AC

## 2022-10-12 MED ORDER — APIXABAN 2.5 MG PO TABS: 2.5000 mg | ORAL_TABLET | Freq: Two times a day (BID) | ORAL | 0 refills | Status: DC

## 2022-10-12 MED ORDER — MIRABEGRON ER 50 MG PO TB24: 50.0000 mg | ORAL_TABLET | Freq: Every day | ORAL | 0 refills | Status: AC

## 2022-10-12 MED ORDER — CALCIUM CARBONATE ANTACID 500 MG PO CHEW: 1.0000 | CHEWABLE_TABLET | Freq: Every day | ORAL | Status: DC

## 2022-10-12 MED ORDER — TAMSULOSIN HCL 0.4 MG PO CAPS: 0.4000 mg | ORAL_CAPSULE | Freq: Every day | ORAL | 0 refills | Status: AC

## 2022-10-12 MED ORDER — ACETAMINOPHEN 325 MG PO TABS: 650.0000 mg | ORAL_TABLET | ORAL | Status: DC | PRN

## 2022-10-12 MED ORDER — TADALAFIL 5 MG PO TABS: 5.0000 mg | ORAL_TABLET | Freq: Every day | ORAL | 0 refills | Status: AC

## 2022-10-12 NOTE — Plan of Care (Signed)
  Problem: RH Vision Goal: RH LTG Vision (Specify) Outcome: Completed/Met   Problem: Education: Goal: Understanding of CV disease, CV risk reduction, and recovery process will improve Outcome: Completed/Met Goal: Individualized Educational Video(s) Outcome: Completed/Met   Problem: Activity: Goal: Ability to return to baseline activity level will improve Outcome: Completed/Met   Problem: Cardiovascular: Goal: Ability to achieve and maintain adequate cardiovascular perfusion will improve Outcome: Completed/Met Goal: Vascular access site(s) Level 0-1 will be maintained Outcome: Completed/Met   Problem: Health Behavior/Discharge Planning: Goal: Ability to safely manage health-related needs after discharge will improve Outcome: Completed/Met   Problem: Education: Goal: Knowledge of disease or condition will improve Outcome: Completed/Met Goal: Knowledge of secondary prevention will improve (MUST DOCUMENT ALL) Outcome: Completed/Met Goal: Knowledge of patient specific risk factors will improve [name] N/A or DELETE if not current risk factor) Outcome: Completed/Met   Problem: Ischemic Stroke/TIA Tissue Perfusion: Goal: Complications of ischemic stroke/TIA will be minimized Outcome: Completed/Met   Problem: Coping: Goal: Will verbalize positive feelings about self Outcome: Completed/Met Goal: Will identify appropriate support needs Outcome: Completed/Met   Problem: Health Behavior/Discharge Planning: Goal: Ability to manage health-related needs will improve Outcome: Completed/Met Goal: Goals will be collaboratively established with patient/family Outcome: Completed/Met   Problem: Self-Care: Goal: Ability to participate in self-care as condition permits will improve Outcome: Completed/Met Goal: Verbalization of feelings and concerns over difficulty with self-care will improve Outcome: Completed/Met Goal: Ability to communicate needs accurately will improve Outcome:  Completed/Met   Problem: Nutrition: Goal: Risk of aspiration will decrease Outcome: Completed/Met Goal: Dietary intake will improve Outcome: Completed/Met

## 2022-10-12 NOTE — Progress Notes (Signed)
Occupational Therapy Discharge Summary  Patient Details  Name: [name] MRN: [id number] Date of Birth: [birth year]  Date of Discharge from OT service:October 11, 2022  Today's Date: 10/12/2022 OT Individual Time:  -       Patient has met 9 of 9 long term goals due to improved activity tolerance, improved balance, postural control, ability to compensate for deficits, improved attention, and improved awareness.  Patient to discharge at overall Supervision level.  Patient's care partner is independent to provide the necessary physical and cognitive assistance at discharge.    Reasons goals not met: NA  Recommendation: No further OT warranted at this time. Equipment: No equipment provided  Reasons for discharge: treatment goals met and discharge from hospital  Patient/family agrees with progress made and goals achieved: Yes  OT Discharge Precautions/Restrictions  Precautions Precautions: Fall Precaution Comments: h/o falls Restrictions Weight Bearing Restrictions: No  Pain Pain Assessment Pain Scale: 0-10 Pain Score: 0-No pain ADL ADL Eating: Independent Where Assessed-Eating: Chair Grooming: Independent Where Assessed-Grooming: Standing at sink Upper Body Bathing: Modified independent Where Assessed-Upper Body Bathing: Shower Lower Body Bathing: Modified independent Where Assessed-Lower Body Bathing: Shower Upper Body Dressing: Modified independent (Device) Where Assessed-Upper Body Dressing: Chair Lower Body Dressing: Modified independent Where Assessed-Lower Body Dressing: Sitting at sink, Standing at sink Toileting: Modified independent Where Assessed-Toileting: Toilet, Bedside Commode Toilet Transfer: Modified independent Statistician Method: Proofreader: Bedside commode, Grab bars Tub/Shower Transfer: Unable to assess Tub/Shower Transfer Method: Unable to assess [employer] Transfer: Close supervision (actual shower stall -  supervision.  In room - modified I) Film/video editor Method: Designer, industrial/product: Sales promotion account executive Baseline Vision/History: 1 Wears glasses Patient Visual Report: No change from baseline Vision Assessment?: No apparent visual deficits Eye Alignment: Within Functional Limits Ocular Range of Motion: Within Functional Limits Alignment/Gaze Preference: Within Defined Limits Tracking/Visual Pursuits: Able to track stimulus in all quads without difficulty Additional Comments: Patient initially reported slight far peripheral left loss - reports this has resolved Perception  Perception: Within Functional Limits Praxis Praxis: Intact Cognition Cognition Overall Cognitive Status: History of cognitive impairments - at baseline Arousal/Alertness: Awake/alert Orientation Level: Person;Place;Situation Person: Oriented Place: Oriented Situation: Oriented Memory: Impaired Memory Impairment: Decreased short term memory;Storage deficit;Decreased recall of new information Attention: Selective;Alternating Focused Attention: Appears intact Sustained Attention: Appears intact Selective Attention: Impaired Selective Attention Impairment: Functional complex Awareness: Impaired Awareness Impairment: Emergent impairment Problem Solving: Impaired Problem Solving Impairment: Functional complex Executive Function: Sequencing;Self Monitoring;Organizing Sequencing: Appears intact Sequencing Impairment: Functional basic Organizing: Appears intact Organizing Impairment: Functional basic Self Monitoring: Impaired Self Monitoring Impairment: Verbal complex Behaviors: Other (comment) (can be hyperverbose and tangential - likely baseline personality) Safety/Judgment: Impaired Brief Interview for Mental Status (BIMS) Repetition of Three Words (First Attempt): 3 Temporal Orientation: Year: Correct Temporal Orientation: Month: Accurate within 5 days Temporal Orientation: Day:  Correct Recall: "Sock": Yes, no cue required Recall: "Blue": Yes, no cue required Recall: "Bed": No, could not recall BIMS Summary Score: 13 Sensation Sensation Light Touch: Appears Intact Hot/Cold: Appears Intact Proprioception: Appears Intact Stereognosis: Not tested Coordination Gross Motor Movements are Fluid and Coordinated: No Fine Motor Movements are Fluid and Coordinated: No Coordination and Movement Description: General debility Heel Shin Test: WFL Motor  Motor Motor: Abnormal postural alignment and control Motor - Skilled Clinical Observations: forward head, flexed trunk, posterior pelvis - mild Motor - Discharge Observations: forward head, flexed trunk, posterior pelvis - mild Mobility  Bed Mobility Bed Mobility: Sit to  Supine;Supine to Sit Rolling Left: Independent Left Sidelying to Sit: Independent Supine to Sit: Independent Sit to Supine: Independent Transfers Sit to Stand: Independent with assistive device Stand to Sit: Independent with assistive device  Trunk/Postural Assessment  Cervical Assessment Cervical Assessment: Exceptions to [facility] Thoracic Assessment Thoracic Assessment: Exceptions to [facility] Lumbar Assessment Lumbar Assessment: Exceptions to [facility] Postural Control Postural Control: Within Functional Limits Righting Reactions: delayed - slight posterior bias  Balance Balance Balance Assessed: Yes Static Sitting Balance Static Sitting - Balance Support: No upper extremity supported Static Sitting - Level of Assistance: 7: Independent Dynamic Sitting Balance Dynamic Sitting - Balance Support: No upper extremity supported;During functional activity Dynamic Sitting - Level of Assistance: 7: Independent Static Standing Balance Static Standing - Balance Support: Right upper extremity supported;Left upper extremity supported;During functional activity Static Standing - Level of Assistance: 6: Modified independent (Device/Increase time) Dynamic Standing  Balance Dynamic Standing - Balance Support: Right upper extremity supported;Left upper extremity supported;During functional activity Dynamic Standing - Level of Assistance: 6: Modified independent (Device/Increase time) Extremity/Trunk Assessment RUE Assessment RUE Assessment: Within Functional Limits LUE Assessment LUE Assessment: Within Functional Limits   [name] 10/12/2022, 12:42 PM

## 2022-10-12 NOTE — Plan of Care (Signed)

## 2022-10-12 NOTE — Progress Notes (Signed)
Physical Therapy Discharge Summary  Patient Details  Name: [name] MRN: [id number] Date of Birth: 1934-07-06  Date of Discharge from PT service:October 11, 2022   Patient has met 8 of 8 long term goals due to improved activity tolerance, improved balance, improved postural control, increased strength, ability to compensate for deficits, improved attention, and improved awareness.  Patient to discharge at an ambulatory level Supervision.   Patient's care partner is independent to provide the necessary physical and cognitive assistance at discharge.  Reasons goals not met: n/a  Recommendation:  Patient will benefit from ongoing skilled PT services in outpatient setting to continue to advance safe functional mobility, address ongoing impairments in dynamic standing balance, general strengthening and weakness, and minimize fall risk.  Equipment: RW  Reasons for discharge: treatment goals met and discharge from hospital  Patient/family agrees with progress made and goals achieved: Yes  PT Discharge Precautions/Restrictions Precautions Precautions: Fall Precaution Comments: h/o falls Restrictions Weight Bearing Restrictions: No Pain Interference Pain Interference Pain Effect on Sleep: 0. Does not apply - I have not had any pain or hurting in the past 5 days Pain Interference with Therapy Activities: 0. Does not apply - I have not received rehabilitationtherapy in the past 5 days Pain Interference with Day-to-Day Activities: 1. Rarely or not at all Vision/Perception  Vision - History Ability to See in Adequate Light: 0 Adequate Perception Perception: Within Functional Limits Praxis Praxis: Intact  Cognition Overall Cognitive Status: History of cognitive impairments - at baseline Arousal/Alertness: Awake/alert Orientation Level: Oriented X4 Attention: Sustained;Selective;Focused Focused Attention: Appears intact Sustained Attention: Appears intact Selective Attention:  Appears intact Memory: Impaired Memory Impairment: Decreased short term memory;Storage deficit;Decreased recall of new information Awareness: Impaired Awareness Impairment: Anticipatory impairment Problem Solving: Appears intact Sequencing: Appears intact Behaviors: Other (comment) (can be hyperverbose and tangential - likely baseline personality) Safety/Judgment: Appears intact Sensation Sensation Light Touch: Appears Intact Hot/Cold: Appears Intact Proprioception: Appears Intact Stereognosis: Not tested Coordination Gross Motor Movements are Fluid and Coordinated: No Coordination and Movement Description: General debility Heel Shin Test: WFL Motor  Motor Motor: Abnormal postural alignment and control Motor - Skilled Clinical Observations: forward head, flexed trunk, posterior pelvis - mild  Mobility Bed Mobility Bed Mobility: Sit to Supine;Supine to Sit Supine to Sit: Independent Sit to Supine: Independent Transfers Transfers: Sit to Stand;Stand to [employer] Transfers Sit to Stand: Independent with assistive device Stand to Sit: Independent with assistive device Stand Pivot Transfers: Independent with assistive device Transfer (Assistive device): Rolling walker Locomotion  Gait Ambulation: Yes Gait Assistance: Supervision/Verbal cueing Gait Distance (Feet): 200 Feet Assistive device: Rolling walker Gait Assistance Details: Verbal cues for precautions/safety;Verbal cues for safe use of DME/AE;Verbal cues for gait pattern Gait Gait: Yes Gait Pattern: Impaired Gait Pattern: Step-through pattern;Trunk flexed;Lateral trunk lean to left;Lateral trunk lean to right Stairs / Additional Locomotion Stairs: Yes Stairs Assistance: Supervision/Verbal cueing Stair Management Technique: One rail Right;Forwards;Alternating pattern Number of Stairs: 12 Height of Stairs: 6 Wheelchair Mobility Wheelchair Mobility: No  Trunk/Postural Assessment  Cervical Assessment Cervical  Assessment: Exceptions to [facility] (forward head; limited rotation both directions) Thoracic Assessment Thoracic Assessment: Exceptions to [facility] (rounded shoulders) Lumbar Assessment Lumbar Assessment: Exceptions to [facility] (posteriorly tilted) Postural Control Postural Control: Within Functional Limits  Balance Balance Balance Assessed: Yes Static Sitting Balance Static Sitting - Balance Support: No upper extremity supported Static Sitting - Level of Assistance: 7: Independent Dynamic Sitting Balance Dynamic Sitting - Balance Support: No upper extremity supported;During functional activity Dynamic Sitting -  Level of Assistance: 7: Independent Static Standing Balance Static Standing - Balance Support: Bilateral upper extremity supported Static Standing - Level of Assistance: 6: Modified independent (Device/Increase time) Dynamic Standing Balance Dynamic Standing - Balance Support: Bilateral upper extremity supported Dynamic Standing - Level of Assistance: 5: Stand by assistance Extremity Assessment      RLE Assessment RLE Assessment: Exceptions to [facility] General Strength Comments: Grossly 4/5 LLE Assessment LLE Assessment: Exceptions to [facility] General Strength Comments: Grossly 4/5   Chosen Geske P Yoshimi Sarr  PT, DPT, CSRS  10/12/2022, 12:10 PM

## 2022-10-12 NOTE — Progress Notes (Signed)
PROGRESS NOTE   Subjective/Complaints: No new complaints this morning Ready to d/c home SBP elevated and DBP soft, other VSS Had BM today  ROS: Patient denies fever, rash, sore throat, blurred vision, dizziness, nausea, vomiting, diarrhea, cough, shortness of breath or chest pain, joint or back/neck pain, headache, or mood change. +constipation- improved but still present, denies pain   Objective:   DG Chest 2 View  Result Date: 10/11/2022 CLINICAL DATA:  Leukocytosis EXAM: CHEST - 2 VIEW COMPARISON:  03/11/2017 FINDINGS: There is some bandlike changes in the left lung base. Atelectasis is favored. Minimal at the right lung base as well. No pneumothorax or edema. Normal cardiopericardial silhouette. Calcified aorta. Status post median sternotomy. Diffuse degenerative changes of the spine. Left lower chest loop recorder IMPRESSION: Basilar atelectasis on the left-greater-than-right. Postop chest. Loop recorder. Electronically Signed   By: Karen Kays M.D.   On: 10/11/2022 10:56   Recent Labs    10/10/22 0924 10/11/22 0610  WBC 16.5* 13.1*  HGB 12.7* 12.3*  HCT 38.3* 35.9*  PLT 219 216   Recent Labs    10/10/22 0558  NA 133*  K 3.5  CL 101  CO2 22  GLUCOSE 155*  BUN 18  CREATININE 1.48*  CALCIUM 8.5*    Intake/Output Summary (Last 24 hours) at 10/12/2022 0944 Last data filed at 10/11/2022 1914 Gross per 24 hour  Intake 600 ml  Output --  Net 600 ml        Physical Exam: Vital Signs Blood pressure (!) 129/56, pulse 73, temperature 98.1 F (36.7 C), temperature source Oral, resp. rate 16, weight 79.4 kg, SpO2 98 %. Gen: no distress, normal appearing, BMI 25.84 HEENT: oral mucosa pink and moist, NCAT Cardio: Reg rate Chest: CTA bilaterally without wheezes, rales, or rhonchi; no distress Abdomen: Soft, non-tender, non-distended, bowel sounds positive. Extremities: No clubbing, cyanosis, or edema. Pulses are  2+ Psych: Pt's affect is appropriate. Pt is cooperative Skin: Clean and intact without signs of breakdown.  Mild ingrown medial edge of great toe nail.  No drainage or swelling, minimal redness.   Neuro:  Alert and oriented x 3.  Moving all 4 limbs antigravity and against resistance. Prior exams: Normal insight and awareness. Intact Memory. Normal language and speech. Cranial nerve exam unremarkable except for mild left C7. MMT: RUE 4+ to 5/5 prox to distal. RLE 4+ prox to 5/5 distal. LUE 4- prox to 4+ distally. LLE 4+ to 5-/5 prox to distal.    Musculoskeletal: Full ROM, No pain with AROM or PROM in the neck, trunk, or extremities. Posture appropriate     Assessment/Plan: 1. Functional deficits which require 3+ hours per day of interdisciplinary therapy in a comprehensive inpatient rehab setting. Physiatrist is providing close team supervision and 24 hour management of active medical problems listed below. Physiatrist and rehab team continue to assess barriers to discharge/monitor patient progress toward functional and medical goals  Care Tool:  Bathing    Body parts bathed by patient: Right arm, Left arm, Chest, Abdomen, Front perineal area, Buttocks, Right upper leg, Face, Left upper leg   Body parts bathed by helper: Right lower leg, Left lower leg  Bathing assist Assist Level: Minimal Assistance - Patient > 75%     Upper Body Dressing/Undressing Upper body dressing   What is the patient wearing?: Pull over shirt    Upper body assist Assist Level: Set up assist    Lower Body Dressing/Undressing Lower body dressing      What is the patient wearing?: Incontinence brief, Pants     Lower body assist Assist for lower body dressing: Moderate Assistance - Patient 50 - 74%     Toileting Toileting Toileting Activity did not occur (Clothing management and hygiene only): N/A (no void or bm)  Toileting assist Assist for toileting: Minimal Assistance - Patient > 75%      Transfers Chair/bed transfer  Transfers assist     Chair/bed transfer assist level: Independent with assistive device Chair/bed transfer assistive device: Armrests, Geologist, engineering   Ambulation assist      Assist level: Supervision/Verbal cueing Assistive device: Walker-rolling Max distance: 150'   Walk 10 feet activity   Assist     Assist level: Supervision/Verbal cueing Assistive device: Walker-rolling   Walk 50 feet activity   Assist    Assist level: Supervision/Verbal cueing Assistive device: Walker-rolling    Walk 150 feet activity   Assist    Assist level: Supervision/Verbal cueing Assistive device: Walker-rolling    Walk 10 feet on uneven surface  activity   Assist     Assist level: Contact Guard/Touching assist Assistive device: Walker-rolling   Wheelchair     Assist Is the patient using a wheelchair?: No             Wheelchair 50 feet with 2 turns activity    Assist            Wheelchair 150 feet activity     Assist          Blood pressure (!) 129/56, pulse 73, temperature 98.1 F (36.7 C), temperature source Oral, resp. rate 16, weight 79.4 kg, SpO2 98 %.  Medical Problem List and Plan: 1. Functional deficits secondary to multiple scattered bilateral ischemic infarct status post thrombectomy of left P1/PCA             -patient may shower             -ELOS/Goals: 8-12 days, Mod I level PT/OT/SLP  -Patient is beginning CIR therapies today including PT, OT, and SLP   Grounds pass ordered 2.  Antithrombotics: -DVT/anticoagulation:  Pharmaceutical: Eliquis             -antiplatelet therapy: Plavix 75 mg daily 3. Pain Management: Tylenol as needed 4. Mood/Behavior/Sleep: Provide emotional support             -antipsychotic agents: N/A 5. Neuropsych/cognition: This patient is capable of making decisions on his own behalf. 6. Skin/Wound Care: Routine skin checks  -no treatment necessary  for ingrown nail--can address as outpt if needed 7. Fluids/Electrolytes/Nutrition: Routine in and outs with follow-up chemistries 8.  Diabetes mellitus.  Hemoglobin A1c 8.4.  Presently on Semglee 5 units nightly.  Check blood sugars before meals and at bedtime.  Patient on  Glucophage 1000 mg twice daily prior to admission as well as Ozempic and Jardiance 25 mg daily.  CBG (last 3)  - well controlled Recent Labs    10/11/22 1645 10/11/22 2053 10/12/22 0618  GLUCAP 172* 146* 105*    -Resume jardiance. Dc semglee. Start psyllium daily 9.  Hypertension.Presently on Sectral 200 mg daily, Cialis 5 mg daily., and on  Benicar 40-25 mg daily--drop hydrochlorothiazide component given #10.     -6/21-24 bp well controlled    10/12/2022    6:21 AM 10/11/2022    7:22 PM 10/11/2022    3:56 PM  Vitals with BMI  Systolic 129 124 98  Diastolic 56 62 68  Pulse 73 73 72     10.  AKI.  I suspect he might have a chronic component as well  -6/21 slight increase of Cr to 1.34 today   -encourage fluids   -dc hydrochlorothiazide  250 cc fluid bolus ordered 6/23 since Cr uptrending slightly  - 6/24: Cr unchanged 1.2-1.4; likely baseline  11.  Atrial fibrillation/CAD/CABG.  Status post loop recorder.  Followed by cardiology services.  Cardiac rate controlled.  Continue Eliquis  12.  Hyperlipidemia.  Continue Zetia/Lipitor  13.  BPH.  Continue Flomax 0.4 mg daily, Myrbetriq 50 mg daily  -remove external cath--up to toile to void.   -Continent voids, monitor  14.  Obesity.  BMI 25.84.  Dietary follow-up  15. Constipation: continue psyllium daily, decrease senna to 1 tab. Last BM 6/22 as per patient, discussed with nursing   - LBM 6/23, asked nursing to bring him prune juice  16. Hypocalcemia: tums started after supper, continue  17. Leukocytosis. 7->16; unknown origin. Vitals stable. Repeat labs with diff and start infectious workup with urine reflex to culture and repeat   -Repeat remains elevated,  neutrophil predominant, urinalysis negative.  CXR reviewed and shows atelectasis, CBC reviewed and downtrending, monitor outpatient  18. Atelectasis: Incentive spirometer ordered, encourage its use   >30 minutes spent in discharge of patient including review of medications and follow-up appointments, physical examination, and in answering all patient's questions     LOS: 6 days A FACE TO FACE EVALUATION WAS PERFORMED  Drema Pry Girtha Kilgore 10/12/2022, 9:44 AM

## 2022-10-12 NOTE — Progress Notes (Signed)
Inpatient Rehabilitation Care Coordinator Discharge Note   Patient Details  Name: [name] MRN: [id number] Date of Birth: 11-02-1934   Discharge location: HOME WITH FAMILY AND FREINDS CHECKING IN ON HIM  Length of Stay: 5 DAYS  Discharge activity level: SUPERVISION-MOD/I LEVEL  Home/community participation: ACTIVE  Patient response NW:GNFAOZ Literacy - How often do you need to have someone help you when you read instructions, pamphlets, or other written material from your doctor or pharmacy?: Never  Patient response HY:QMVHQI Isolation - How often do you feel lonely or isolated from those around you?: Never  Services provided included: MD, RD, PT, OT, SLP, RN, CM, Pharmacy, SW  Financial Services:  Financial Services Utilized: Private Insurance HEALTH TEAM ADVANTAGE  Choices offered to/list presented to: PT AND SON  Follow-up services arranged:  Outpatient, DME, Patient/Family has no preference for HH/DME agencies    Outpatient Servicies: [facility] PT-VESTIBULAR WILL CALL TO SET UP FOLLOW UP APPOINTMENT DME : [facility]    Patient response to transportation need: Is the patient able to respond to transportation needs?: Yes In the past 12 months, has lack of transportation kept you from medical appointments or from getting medications?: No In the past 12 months, has lack of transportation kept you from meetings, work, or from getting things needed for daily living?: No   Patient/Family verbalized understanding of follow-up arrangements:  Yes  Individual responsible for coordination of the follow-up plan: SELF AND [name]-DAUGHTER [phone]  Confirmed correct DME delivered: [name] 10/12/2022    Comments (or additional information):SON, GRANDSON AND FRIEND WERE IN FOR EDUCATION AWARE OF PT'S CARE NEEDS. PT TENDS TO DO WHAT HE WANTS AND THIS WILL MAKE HIM A HIGH FALL RISK BUT AWARE OF TEAM'S  RECOMMENDATIONS.  Summary of Stay    Date/Time Discharge Planning CSW  10/11/22 1514 HOme with children and friend in and out and can provide transportation to OPPT. Ordered rolling walker. Pt insisting on DC tomorrow RGD       [name], [name]

## 2022-10-12 NOTE — Progress Notes (Signed)
Inpatient Rehabilitation Discharge Medication Review by a Pharmacist  A complete drug regimen review was completed for this patient to identify any potential clinically significant medication issues.  High Risk Drug Classes Is patient taking? Indication by Medication  Antipsychotic No   Anticoagulant Yes Apixaban - Atrial fibrillation  Antibiotic No   Opioid No   Antiplatelet Yes Clopidogrel - stroke ppx  Hypoglycemics/insulin Yes Empagliflozin - DM  Vasoactive Medication Yes Olmesartan, acebutolol - HTN  Chemotherapy No   Other Yes Ca Carbonate - supplement Atorvastatin, Zetia - HLD Mirabegron, tamsulosin - urinary urgency/retention Tadalafil - BPH     Type of Medication Issue Identified Description of Issue Recommendation(s)  Drug Interaction(s) (clinically significant)     Duplicate Therapy     Allergy     No Medication Administration End Date     Incorrect Dose     Additional Drug Therapy Needed     Significant med changes from prior encounter (inform family/care partners about these prior to discharge). Metformin, aspirin, hydrochlorothiazide stopped during inpatient admission Communicate medication changes with patient/family at discharge  Other       Clinically significant medication issues were identified that warrant physician communication and completion of prescribed/recommended actions by midnight of the next day:  No   Pharmacist comments: n/a   Time spent performing this drug regimen review (minutes): 20   Thank you for allowing pharmacy to be a part of this patient's care.  Thelma Barge, PharmD Clinical Pharmacist

## 2022-10-17 ENCOUNTER — Encounter: Payer: Self-pay | Admitting: *Deleted

## 2022-10-17 ENCOUNTER — Telehealth: Payer: Self-pay | Admitting: *Deleted

## 2022-10-17 DIAGNOSIS — G464 Cerebellar stroke syndrome: Secondary | ICD-10-CM | POA: Diagnosis not present

## 2022-10-17 DIAGNOSIS — R293 Abnormal posture: Secondary | ICD-10-CM | POA: Diagnosis not present

## 2022-10-17 DIAGNOSIS — M542 Cervicalgia: Secondary | ICD-10-CM | POA: Diagnosis not present

## 2022-10-17 DIAGNOSIS — R29898 Other symptoms and signs involving the musculoskeletal system: Secondary | ICD-10-CM | POA: Diagnosis not present

## 2022-10-17 NOTE — Telephone Encounter (Signed)
[name] (KeyEli Hose) PA Case ID #: [id number]

## 2022-10-19 ENCOUNTER — Encounter: Payer: Self-pay | Admitting: Cardiology

## 2022-10-19 ENCOUNTER — Ambulatory Visit: Payer: HMO | Admitting: Cardiology

## 2022-10-19 VITALS — BP 106/67 | HR 77 | Resp 16 | Ht 69.0 in | Wt 168.0 lb

## 2022-10-19 DIAGNOSIS — Z95818 Presence of other cardiac implants and grafts: Secondary | ICD-10-CM | POA: Diagnosis not present

## 2022-10-19 DIAGNOSIS — I25118 Atherosclerotic heart disease of native coronary artery with other forms of angina pectoris: Secondary | ICD-10-CM

## 2022-10-19 DIAGNOSIS — I63443 Cerebral infarction due to embolism of bilateral cerebellar arteries: Secondary | ICD-10-CM | POA: Diagnosis not present

## 2022-10-19 DIAGNOSIS — I4819 Other persistent atrial fibrillation: Secondary | ICD-10-CM

## 2022-10-19 DIAGNOSIS — I480 Paroxysmal atrial fibrillation: Secondary | ICD-10-CM | POA: Diagnosis not present

## 2022-10-19 DIAGNOSIS — I951 Orthostatic hypotension: Secondary | ICD-10-CM

## 2022-10-19 NOTE — Progress Notes (Unsigned)
Primary Physician/Referring:  Cleatis Polka., MD  Patient ID: [name], male    DOB: May 14, 1934, 88 y.o.   MRN: [id number]  Chief Complaint  Patient presents with   Atherosclerosis of native coronary artery of native heart w   Frequent stools   HPI:    [name]  is a 88 y.o. history of CAD s/p CABG  in 2008 with LIMA to LAD, sequential saphenous vein graft to first second and third obtuse marginals, and sequential saphenous vein graft to the RCA and right PDA. He has history of hypertension, hyperlipidemia, diabetes mellitus, GERD and esophageal and gastric ulcers diagnosed in Jan 2023 for dysphagia.  He has not had any bleeding diathesis and doing well.  Due to recurrent syncope underwent loop recorder plantation on 07/28/2021.    Patient presented on 10/01/2022 with visual disturbances, has this to her left facial droop, trace left arm weakness, left hemianopia and found to have multiple scattered bilateral ischemic infarcts status post thrombectomy of the left P1 segment of the posterior cerebral artery.  Patient upon admission was found to have atypical atrial flutter.   Patient was discharged home on apixaban and is butyl all for hypertension and A-fib with RVR and olmesartan HCT was discontinued due to soft blood pressure.  He now presents for follow-up.  ***  Past Medical History:  Diagnosis Date   Acute renal failure (HCC)    secondary to nephrotic syndrome post cath, resolved with steroids   Anxiety    CAD (coronary artery disease) 04/2006   LIMA to LAD, sequential SVG to first, second, and third OM's, sequential SVG to mid RCA and PDA    Depression    Diabetes mellitus without complication (HCC)    ED (erectile dysfunction)    HTN (hypertension)    Hx of adenomatous colonic polyps    Hyperlipidemia    Loop recorder: Biotronic Biomonitor II loop 07/28/2021 07/28/2021   Obesity    Past Surgical History:  Procedure Laterality Date   COLONOSCOPY W/  BIOPSIES AND POLYPECTOMY     CORONARY ARTERY BYPASS GRAFT  2008   VESSELS X6   IR CT HEAD LTD  10/02/2022   IR PERCUTANEOUS ART THROMBECTOMY/INFUSION INTRACRANIAL INC DIAG ANGIO  10/02/2022   IR US GUIDE VASC ACCESS RIGHT  10/02/2022   LOOP RECORDER IMPLANT     RADIOLOGY WITH ANESTHESIA N/A 10/02/2022   Procedure: IR WITH ANESTHESIA;  Surgeon: Radiologist, Medication, MD;  Location: [facility] OR;  Service: Radiology;  Laterality: N/A;   UMBILICAL HERNIA REPAIR     Family History  Problem Relation Age of Onset   Heart attack Mother    Clotting disorder Father        died of renal disease   Heart disease Sister    Cancer Sister    Colon cancer Neg Hx     Social History   Tobacco Use   Smoking status: Never   Smokeless tobacco: Never  Substance Use Topics   Alcohol use: No    Alcohol/week: 1.0 standard drink of alcohol    Types: 1 Glasses of wine per week   Marital Status: Widowed  ROS  ***Review of Systems  Cardiovascular:  Negative for chest pain, dyspnea on exertion and leg swelling.  Neurological:  Positive for disturbances in coordination.   Objective      10/19/2022   10:56 AM 10/12/2022    6:21 AM 10/11/2022    7:22 PM  Vitals with BMI  Height  5\' 9"     Weight 168 lbs    BMI 24.8    Systolic 106 129 161  Diastolic 67 56 62  Pulse 77 73 73   Blood pressure 106/67, pulse 77, resp. rate 16, height 5\' 9"  (1.753 m), weight 168 lb (76.2 kg), SpO2 95 %.   ***Physical Exam Neck:     Vascular: No carotid bruit or JVD.  Cardiovascular:     Rate and Rhythm: Normal rate. Rhythm irregular. Occasional Extrasystoles are present.    Pulses: Intact distal pulses.     Heart sounds: Normal heart sounds. No murmur heard.    No gallop.  Pulmonary:     Effort: Pulmonary effort is normal.     Breath sounds: Normal breath sounds.  Abdominal:     General: Bowel sounds are normal.     Palpations: Abdomen is soft.  Musculoskeletal:     Right lower leg: Edema (2+ ankle only edema)  present.     Left lower leg: Edema (2+ ankle only edema) present.     Laboratory examination:   Recent Labs    10/07/22 0758 10/09/22 0641 10/10/22 0558  NA 138 135 133*  K 3.8 3.6 3.5  CL 100 103 101  CO2 27 23 22   GLUCOSE 158* 129* 155*  BUN 23 20 18   CREATININE 1.34* 1.47* 1.48*  CALCIUM 9.3 8.7* 8.5*  GFRNONAA 52* 46* 46*    Lab Results  Component Value Date   GLUCOSE 155 (H) 10/10/2022   NA 133 (L) 10/10/2022   K 3.5 10/10/2022   CL 101 10/10/2022   CO2 22 10/10/2022   BUN 18 10/10/2022   CREATININE 1.48 (H) 10/10/2022   GFRNONAA 46 (L) 10/10/2022   CALCIUM 8.5 (L) 10/10/2022   PROT 6.1 (L) 10/07/2022   ALBUMIN 3.0 (L) 10/07/2022   BILITOT 1.1 10/07/2022   ALKPHOS 54 10/07/2022   AST 51 (H) 10/07/2022   ALT 47 (H) 10/07/2022   ANIONGAP 10 10/10/2022      Lab Results  Component Value Date   ALT 47 (H) 10/07/2022   AST 51 (H) 10/07/2022   ALKPHOS 54 10/07/2022   BILITOT 1.1 10/07/2022       Latest Ref Rng & Units 10/11/2022    6:10 AM 10/10/2022    9:24 AM 10/10/2022    5:58 AM  CBC  WBC 4.0 - 10.5 K/uL 13.1  16.5  16.3   Hemoglobin 13.0 - 17.0 g/dL 09.6  04.5  40.9   Hematocrit 39.0 - 52.0 % 35.9  38.3  38.1   Platelets 150 - 400 K/uL 216  219  193        Latest Ref Rng & Units 10/07/2022    7:58 AM 10/01/2022    8:57 PM 09/28/2021    7:10 PM  Hepatic Function  Total Protein 6.5 - 8.1 g/dL 6.1  7.7  8.1   Albumin 3.5 - 5.0 g/dL 3.0  4.3  4.5   AST 15 - 41 U/L 51  20  20   ALT 0 - 44 U/L 47  15  18   Alk Phosphatase 38 - 126 U/L 54  64  61   Total Bilirubin 0.3 - 1.2 mg/dL 1.1  0.5  0.8     Lipid Panel Recent Labs    10/02/22 0227  CHOL 136  TRIG 68  LDLCALC 77  VLDL 14  HDL 45  CHOLHDL 3.0    HEMOGLOBIN A1C Lab Results  Component Value Date  HGBA1C 8.4 (H) 10/02/2022   MPG 194.38 10/02/2022   No results found for: "TSH"   External labs:   Labs 10/02/2021:   Hb 17.0/HCT 51.9, platelets 201.  Normal indicis.   A1c  9.9%.   Urine analysis protein negative.  3+ glucose.   Labs 10/20/2021:   Serum glucose 183 mg, BUN 25, creatinine 1.3, EGFR 52.5 mL, potassium 5.3, LFTs normal.   Total cholesterol 118, triglycerides 80, HDL 36, LDL 66.   A1c 7.8%.  TSH normal at 0.64.  Radiology:   CT angiogram chest 09/28/2021: 1. No aortic aneurysm or dissection. 2. No pulmonary artery embolus identified. 3. Cholelithiasis with mild thickened appearance of the gallbladder wall. Correlation with ultrasound is recommended to exclude acute cholecystitis. 4. Fatty liver. 5. There is a 3 mm right upper lobe subpleural nodule. No follow-up needed if patient is low-risk.T   MRI of the brain 10/02/2022: 1. Small acute infarcts scattered in the bilateral cerebellum (left greater than right). Punctate acute lacunar infarcts in both the left pons and thalamus. And minimal occipital lobe restricted diffusion, perhaps greater on the right. 2. No hemorrhagic transformation or intracranial mass effect. 3. Underlying chronic small vessel disease, including chronic thalamic microhemorrhages, and nonspecific ventricular enlargement.  Cardiac Studies:   PCV MYOCARDIAL PERFUSION WITH LEXISCAN 07/05/2021  Narrative   Findings are consistent with ischemia. The study is high risk.   Left ventricular function is abnormal. The left ventricular ejection fraction is severely decreased (<30%). End diastolic cavity size is normal.  Lexiscan (with Mod Bruce protocol) Nuclear stress test 07/05/2021: Nondiagnostic ECG stress. The heart rate response was consistent with Lexiscan. Occasional PACs and PVCs. There is a reversible moderate defect in the inferior and apical regions. Overall LV systolic function is abnormal with global hypokinesis and inferior akinesis. Stress LV EF: 28%. Compared to the report of study done on 09/04/2017, inferior wall defect noted as scar without ischemia, LVEF was mildly decreased at 50%.  High risk study due  to marked decrease in LVEF.  ECHO COMPLETE WITH IMAGING ENHANCING AGENT 10/02/2022 1. Left ventricular ejection fraction, by estimation, is 60 to 65%. The left ventricle has normal function. The left ventricle has no regional wall motion abnormalities. Left ventricular diastolic parameters are indeterminate. 2. Right ventricular systolic function is mildly reduced. The right ventricular size is normal. Tricuspid regurgitation signal is inadequate for assessing PA pressure. The estimated right ventricular systolic pressure is 37.8 mmHg. 3. Left atrial size was mild to moderately dilated. 4. The mitral valve is degenerative. Mild mitral valve regurgitation. No evidence of mitral stenosis. 5. The aortic valve is grossly normal. There is mild calcification of the aortic valve. There is mild thickening of the aortic valve. Aortic valve regurgitation is not visualized. No aortic stenosis is present. 6. The inferior vena cava is dilated in size with <50% respiratory variability, suggesting right atrial pressure of 15 mmHg.    Loop recorder: Biot[employer] Biomonitor II loop 07/28/2021 for syncope     Remote loop recorder check 09/21/2022 Predominant rhythm is normal sinus rhythm. Atrial monitoring episodes = PACs, PVCs, brief AT/AF/SVT. No symptoms reported. No heart block.    In person loop check 10/03/2022: Patient AF burden less than 2%, no persistent atrial fibrillation.  Probably under sensing P waves as patient EKG correlation reveals atrial flutter on surface EKG.  EKG:   ***  EKG 10/01/2022: Atypical atrial flutter with controlled ventricular response, nonspecific T abnormality. Repeat EKG 10/03/2022, no change.  Medications and allergies  Allergies  Allergen Reactions   Ketorolac Tromethamine Other (See Comments)    nephrotic syndrome     Medication list   Current Outpatient Medications:    acebutolol (SECTRAL) 200 MG capsule, Take 1 capsule (200 mg total) by mouth 2 (two) times daily.,  Disp: 180 capsule, Rfl: 1   acetaminophen (TYLENOL) 325 MG tablet, Take 2 tablets (650 mg total) by mouth every 4 (four) hours as needed for mild pain (or temp > 37.5 C (99.5 F))., Disp: , Rfl:    atorvastatin (LIPITOR) 80 MG tablet, Take 1 tablet (80 mg total) by mouth daily., Disp: 30 tablet, Rfl: 0   empagliflozin (JARDIANCE) 25 MG TABS tablet, Take 1 tablet (25 mg total) by mouth daily., Disp: 30 tablet, Rfl: 0   mirabegron ER (MYRBETRIQ) 50 MG TB24 tablet, Take 1 tablet (50 mg total) by mouth daily., Disp: 30 tablet, Rfl: 0   olmesartan (BENICAR) 20 MG tablet, Take 1 tablet (20 mg total) by mouth daily., Disp: 30 tablet, Rfl: 0   tadalafil (CIALIS) 5 MG tablet, Take 1 tablet (5 mg total) by mouth daily., Disp: 10 tablet, Rfl: 0   tamsulosin (FLOMAX) 0.4 MG CAPS capsule, Take 1 capsule (0.4 mg total) by mouth daily after supper., Disp: 30 capsule, Rfl: 0  Assessment     ICD-10-CM   1. 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 Persistent atrial fibrillation (HCC)  I48.19     3. Atherosclerosis of native coronary artery of native heart with stable angina pectoris (HCC)  I25.118     4. Orthostatic hypotension  I95.1     5. Loop recorder: Biot[employer] Biomonitor II loop 07/28/2021  Z95.818        No orders of the defined types were placed in this encounter.   No orders of the defined types were placed in this encounter.   Medications Discontinued During This Encounter  Medication Reason   apixaban (ELIQUIS) 2.5 MG TABS tablet    calcium carbonate (TUMS - DOSED IN MG ELEMENTAL CALCIUM) 500 MG chewable tablet    clopidogrel (PLAVIX) 75 MG tablet Completed Course   ZETIA 10 MG tablet    clopidogrel (PLAVIX) 75 MG tablet Completed Course     Recommendations:   [name] is a 88 y.o.  history of CAD s/p CABG  in 2008 with LIMA to LAD, sequential saphenous vein graft to first second and third obtuse marginals, and  sequential saphenous vein graft to the RCA and right PDA. He has history of hypertension, hyperlipidemia, diabetes mellitus, GERD and esophageal and gastric ulcers diagnosed in Jan 2023 for dysphagia.  He has not had any bleeding diathesis and doing well.  Due to recurrent syncope underwent loop recorder plantation on 07/28/2021.    Patient presented on 10/01/2022 with visual disturbances, has this to her left facial droop, trace left arm weakness, left hemianopia and found to have multiple scattered bilateral ischemic infarcts status post thrombectomy of the left P1 segment of the posterior cerebral artery.  Patient upon admission was found to have atypical atrial flutter.  ***    Mike Decamp, MD, [facility] 10/19/2022, 12:02 PM Office: [phone]

## 2022-10-22 DIAGNOSIS — R55 Syncope and collapse: Secondary | ICD-10-CM | POA: Diagnosis not present

## 2022-10-22 DIAGNOSIS — Z95818 Presence of other cardiac implants and grafts: Secondary | ICD-10-CM | POA: Diagnosis not present

## 2022-10-22 DIAGNOSIS — Z4509 Encounter for adjustment and management of other cardiac device: Secondary | ICD-10-CM | POA: Diagnosis not present

## 2022-10-24 DIAGNOSIS — R293 Abnormal posture: Secondary | ICD-10-CM | POA: Diagnosis not present

## 2022-10-24 DIAGNOSIS — G464 Cerebellar stroke syndrome: Secondary | ICD-10-CM | POA: Diagnosis not present

## 2022-10-24 DIAGNOSIS — R29898 Other symptoms and signs involving the musculoskeletal system: Secondary | ICD-10-CM | POA: Diagnosis not present

## 2022-10-24 DIAGNOSIS — M542 Cervicalgia: Secondary | ICD-10-CM | POA: Diagnosis not present

## 2022-10-25 ENCOUNTER — Encounter: Payer: HMO | Attending: Registered Nurse | Admitting: Registered Nurse

## 2022-10-25 DIAGNOSIS — E7849 Other hyperlipidemia: Secondary | ICD-10-CM | POA: Insufficient documentation

## 2022-10-25 DIAGNOSIS — I10 Essential (primary) hypertension: Secondary | ICD-10-CM | POA: Insufficient documentation

## 2022-10-25 DIAGNOSIS — I639 Cerebral infarction, unspecified: Secondary | ICD-10-CM | POA: Insufficient documentation

## 2022-10-26 DIAGNOSIS — M542 Cervicalgia: Secondary | ICD-10-CM | POA: Diagnosis not present

## 2022-10-26 DIAGNOSIS — R293 Abnormal posture: Secondary | ICD-10-CM | POA: Diagnosis not present

## 2022-10-26 DIAGNOSIS — G464 Cerebellar stroke syndrome: Secondary | ICD-10-CM | POA: Diagnosis not present

## 2022-10-26 DIAGNOSIS — R29898 Other symptoms and signs involving the musculoskeletal system: Secondary | ICD-10-CM | POA: Diagnosis not present

## 2022-10-27 NOTE — Therapy (Signed)
OUTPATIENT PHYSICAL THERAPY NEURO EVALUATION   Patient Name: [name] MRN: [id number] DOB:Oct 11, 1934, 88 y.o., male Today's Date: 10/28/2022   PCP: Cleatis Polka., MD  REFERRING PROVIDER: Horton Chin, MD  END OF SESSION:  PT End of Session - 10/28/22 1010     Visit Number 1    Number of Visits 5    Date for PT Re-Evaluation 11/25/22    Authorization Type [employer]    PT Start Time 0924    PT Stop Time 1006    PT Time Calculation (min) 42 min    Equipment Utilized During Treatment Gait belt    Activity Tolerance Patient tolerated treatment well    Behavior During Therapy WFL for tasks assessed/performed             Past Medical History:  Diagnosis Date   Acute renal failure (HCC)    secondary to nephrotic syndrome post cath, resolved with steroids   Anxiety    CAD (coronary artery disease) 04/2006   LIMA to LAD, sequential SVG to first, second, and third OM's, sequential SVG to mid RCA and PDA    Depression    Diabetes mellitus without complication (HCC)    ED (erectile dysfunction)    HTN (hypertension)    Hx of adenomatous colonic polyps    Hyperlipidemia    Loop recorder: Biotronic Biomonitor II loop 07/28/2021 07/28/2021   Obesity    Past Surgical History:  Procedure Laterality Date   COLONOSCOPY W/ BIOPSIES AND POLYPECTOMY     CORONARY ARTERY BYPASS GRAFT  2008   VESSELS X6   IR CT HEAD LTD  10/02/2022   IR PERCUTANEOUS ART THROMBECTOMY/INFUSION INTRACRANIAL INC DIAG ANGIO  10/02/2022   IR US GUIDE VASC ACCESS RIGHT  10/02/2022   LOOP RECORDER IMPLANT     RADIOLOGY WITH ANESTHESIA N/A 10/02/2022   Procedure: IR WITH ANESTHESIA;  Surgeon: Radiologist, Medication, MD;  Location: [facility] OR;  Service: Radiology;  Laterality: N/A;   UMBILICAL HERNIA REPAIR     Patient Active Problem List   Diagnosis Date Noted   Ischemic cerebrovascular accident (CVA) (HCC) 10/06/2022   Status post surgery 10/02/2022   Stroke: Multiple scattered bilateral  ischemic infarcts s/p thrombectomy of left P1/PCA TICI 3 Etiology: Likely cardioembolic 10/02/2022   Hemianopsia 10/01/2022   Right homonymous hemianopsia 10/01/2022   Loop recorder: Biotronic Biomonitor II loop 07/28/2021 07/28/2021   Unsteady gait 08/05/2020   BPH with obstruction/lower urinary tract symptoms 01/21/2020   Anxiety disorder 02/02/2018   Right hip pain 10/02/2017   Recurrent major depression in remission (HCC) 01/27/2017   Chronic kidney disease, stage 3a (HCC) 07/17/2015   Personal history of colonic adenomas 03/26/2013   Obesity 10/04/2012   OBSTRUCTIVE SLEEP APNEA 10/30/2008   HYPERLIPIDEMIA-MIXED 07/01/2008   HYPERTENSION, BENIGN 07/01/2008   CAD, NATIVE VESSEL 07/01/2008   FATIGUE / MALAISE 07/01/2008   EDEMA 07/01/2008    ONSET DATE: 10/02/22  REFERRING DIAG: I63.9 (ICD-10-CM) - Cerebral infarction, unspecified  THERAPY DIAG:  Unsteadiness on feet  Other lack of coordination  Other abnormalities of gait and mobility  Rationale for Evaluation and Treatment: Rehabilitation  SUBJECTIVE:  SUBJECTIVE STATEMENT: Patient reports concerns about his balance. Has a walker and some sticks- not currently using them. Denies weakness or N/T since stroke. Not using [facility]. Reports that son thinks that he has vertigo but pt denies feeling dizzy. Son reports imbalance for several years, but worsened since the stroke. Was seeing OPPT at [facility] prior to stroke for "my legs and stuff" and continues to see them. Requesting to continue working with them for LE strength, and to be seen only for balance here at this clinic.   Pt accompanied by: self  PERTINENT HISTORY: Anxiety, CAD, depression, DM, HTN, HLD, loop recorder, CABG 2008  PAIN:  Are you having pain? No  PRECAUTIONS: Fall,  ICD/Pacemaker, and Other: loop recorder  RED FLAGS: None   WEIGHT BEARING RESTRICTIONS: No  FALLS: Has patient fallen in last 6 months? Yes. Number of falls 1 son reports syncopal episode caused the fall  LIVING ENVIRONMENT: Lives with: lives alone Lives in: House/apartment Stairs:  2 steps to enter without handrail; 2nd story home but stays on 1st floor Has following equipment at home: Dan Humphreys - 2 wheeled and walking stick  PLOF: Independent; has cleaning services   PATIENT GOALS: improve balance   OBJECTIVE:   DIAGNOSTIC FINDINGS: 10/02/22 brain MRI: Small acute infarcts scattered in the bilateral cerebellum (left greater than right). Punctate acute lacunar infarcts in both the left pons and thalamus. And minimal occipital lobe restricted diffusion, perhaps greater on the right.   10/01/22 head/neck angio CT: Short segment occlusion of the proximal left P1 segment, with additional moderate stenosis in the distal left P2 segment. Severe stenosis in the left supraclinoid ICA and moderate stenosis in the right supraclinoid ICA. 4. Severe stenosis at the origin of the right PICA. Mild stenosis in the distal basilar artery.   COGNITION: Overall cognitive status: Within functional limits for tasks assessed   SENSATION: Denies N/T in UEs/LEs   COORDINATION: Alternating pronation/supination: dysmetric L>R Alternating toe tap: dysmetric L>R Finger to nose: dysmetric L>R Heel to shin: intact B     MUSCLE TONE: WNL but B calf very tight    POSTURE: rounded shoulders  LOWER EXTREMITY ROM:     Active  Right Eval Left Eval  Hip flexion    Hip extension    Hip abduction    Hip adduction    Hip internal rotation    Hip external rotation    Knee flexion    Knee extension    Ankle dorsiflexion 4 *knee bent 8 knee bent  Ankle plantarflexion    Ankle inversion    Ankle eversion     (Blank rows = not tested)   LOWER EXTREMITY MMT:    MMT (in sitting) Right Eval  Left Eval  Hip flexion 4 4-  Hip extension    Hip abduction 4 3+  Hip adduction 3+ 3+  Hip internal rotation    Hip external rotation    Knee flexion 4 4  Knee extension 5 5  Ankle dorsiflexion 4+ 4  Ankle plantarflexion 4+ 4+  Ankle inversion    Ankle eversion    (Blank rows = not tested)   GAIT: Gait pattern: Slow, wide BOS, unsteady  Assistive device utilized: None Level of assistance: SBA   FUNCTIONAL TESTS:   [facility] PT Assessment - 10/28/22 0001       Standardized Balance Assessment   Standardized Balance Assessment Berg Balance Test;Five Times Sit to Stand;Timed Up and Go Test    Five times  sit to stand comments  13.92 sec   not standing fully upright, heavy armrest use, limited eccentric control     Berg Balance Test   Sit to Stand Able to stand  independently using hands    Standing Unsupported Able to stand safely 2 minutes    Sitting with Back Unsupported but Feet Supported on Floor or Stool Able to sit safely and securely 2 minutes    Stand to Sit Controls descent by using hands    Transfers Able to transfer safely, definite need of hands    Standing Unsupported with Eyes Closed Able to stand 10 seconds safely    Standing Unsupported with Feet Together Able to place feet together independently and stand 1 minute safely    From Standing, Reach Forward with Outstretched Arm Can reach forward >12 cm safely (5")    From Standing Position, Pick up Object from Floor Able to pick up shoe, needs supervision    From Standing Position, Turn to Look Behind Over each Shoulder Looks behind from both sides and weight shifts well    Turn 360 Degrees Able to turn 360 degrees safely but slowly    Standing Unsupported, Alternately Place Feet on Step/Stool Able to complete >2 steps/needs minimal assist    Standing Unsupported, One Foot in Front Able to plae foot ahead of the other independently and hold 30 seconds    Standing on One Leg Unable to try or needs assist to prevent fall     Total Score 41      Timed Up and Go Test   Normal TUG (seconds) 21.38              PATIENT SURVEYS:  FOTO 47, 55  TODAY'S TREATMENT:                                                                                                                              DATE: 10/28/22    PATIENT EDUCATION: Education details: advised pt to bring in walker or walking sticks next sessiion and to use these for ambulation; prognosis, POC (agreed to work on balance only as he wants to continue working on strength at [facility]), HEP- advised we would not continue if HEP is not performed as pt admits to low motivation  Person educated: Patient and Child(ren) Education method: Explanation, Demonstration, Tactile cues, Verbal cues, and Handouts Education comprehension: verbalized understanding and returned demonstration  HOME EXERCISE PROGRAM: Access Code: [email or web address] URL: [email or web address] Date: 10/28/2022 Prepared by: [facility] - [facility]  Exercises - Narrow Stance with Counter Support  - 1 x daily - 5 x weekly - 2 sets - 30 sec hold - Romberg Stance with Eyes Closed  - 1 x daily - 5 x weekly - 2 sets - 30 sec hold - Tandem Walking with Counter Support  - 1 x daily - 5 x weekly - 2 sets - 10 reps - Standing Gastroc  Stretch at Counter  - 1 x daily - 5 x weekly - 2 sets - 30 sec hold  GOALS: Goals reviewed with patient? Yes  SHORT TERM GOALS: Target date: 11/11/2022  Patient to be independent with initial HEP. Baseline: HEP initiated Goal status: INITIAL    LONG TERM GOALS: Target date: 11/25/2022  Patient to be independent with advanced HEP. Baseline: Not yet initiated  Goal status: INITIAL  Patient to demonstrate B ankle DF AROM to at least 10 deg to improve safety with gait.  Baseline: 4 deg, 8 deg with compensations  Goal status: INITIAL  Patient to demonstrate alternating reciprocal pattern when ascending and descending stairs with  good stability and 1 handrail as needed.   Baseline: NT Goal status: INITIAL  Patient to complete TUG in <14 sec with LRAD in order to decrease risk of falls.   Baseline: 21.38 sec Goal status: INITIAL  Patient to demonstrate 5xSTS test in <15 sec with good control and posture in order to decrease risk of falls.  Baseline: 13.92 sec with limited eccentric control and not standing fully Goal status: INITIAL  Patient to score at least 47/56 on Berg in order to decrease risk of falls.  Baseline: 42 Goal status: INITIAL  Patient to score at least 55 on FOTO in order to indicate improved functional outcomes.  Baseline: 47 Goal status: INITIAL   ASSESSMENT:  CLINICAL IMPRESSION:  Patient is an 88 y/o M presenting to OPPT with c/o imbalance s/p hospitalization 6/15-6/20/24 for double vision and hemianopia. Imaging revealed multiple acute strokes. s/p IR thrombectomy of L P1 PCA and participated in CIR 6/20-6/26. Patient today presenting with rounded shoulders, L>R UE/LE dysmetria, significant tightness in B calf and limited ankle DF AROM, L>R LE weakness, gait deviations, and imbalance. Patient scored on Sharlene Motts indicates an increased risk of falls. Patient was educated on gentle balance and stretching HEP and reported understanding. Prior to current episode, patient was independent. Would benefit from skilled PT services 1 x/week for 4 weeks to address aforementioned impairments in order to optimize level of function.    OBJECTIVE IMPAIRMENTS: Abnormal gait, decreased balance, decreased coordination, and impaired flexibility.   ACTIVITY LIMITATIONS: carrying, lifting, bending, standing, stairs, transfers, bathing, toileting, dressing, reach over head, hygiene/grooming, and locomotion level  PARTICIPATION LIMITATIONS: meal prep, cleaning, laundry, driving, shopping, community activity, yard work, and church  PERSONAL FACTORS: Age, Behavior pattern, Past/current experiences, Time since onset  of injury/illness/exacerbation, and 3+ comorbidities: Anxiety, CAD, depression, DM, HTN, HLD, loop recorder, CABG 2008  are also affecting patient's functional outcome.   REHAB POTENTIAL: Good  CLINICAL DECISION MAKING: Evolving/moderate complexity  EVALUATION COMPLEXITY: Moderate  PLAN:  PT FREQUENCY: 1x/week  PT DURATION: 4 weeks  PLANNED INTERVENTIONS: Therapeutic exercises, Therapeutic activity, Neuromuscular re-education, Balance training, Gait training, Patient/Family education, Self Care, Joint mobilization, Stair training, Vestibular training, Canalith repositioning, DME instructions, Aquatic Therapy, Dry Needling, Electrical stimulation, Cryotherapy, Moist heat, Taping, Manual therapy, and Re-evaluation  PLAN FOR NEXT SESSION: review HEP and work on balance activities with narrow BOS, SLS (do not work on strength, he is seen at [facility] for this)   Anette Guarneri, PT, DPT 10/28/22 10:18 AM  [facility] [address] [place], [place] [address] Phone # [phone] Fax # [phone]

## 2022-10-28 ENCOUNTER — Encounter: Payer: Self-pay | Admitting: Physical Therapy

## 2022-10-28 ENCOUNTER — Other Ambulatory Visit: Payer: Self-pay

## 2022-10-28 ENCOUNTER — Ambulatory Visit: Payer: HMO | Attending: Physical Medicine and Rehabilitation | Admitting: Physical Therapy

## 2022-10-28 DIAGNOSIS — R2689 Other abnormalities of gait and mobility: Secondary | ICD-10-CM | POA: Insufficient documentation

## 2022-10-28 DIAGNOSIS — R278 Other lack of coordination: Secondary | ICD-10-CM | POA: Diagnosis not present

## 2022-10-28 DIAGNOSIS — R2681 Unsteadiness on feet: Secondary | ICD-10-CM | POA: Insufficient documentation

## 2022-11-01 DIAGNOSIS — R29898 Other symptoms and signs involving the musculoskeletal system: Secondary | ICD-10-CM | POA: Diagnosis not present

## 2022-11-01 DIAGNOSIS — G464 Cerebellar stroke syndrome: Secondary | ICD-10-CM | POA: Diagnosis not present

## 2022-11-01 DIAGNOSIS — M542 Cervicalgia: Secondary | ICD-10-CM | POA: Diagnosis not present

## 2022-11-01 DIAGNOSIS — R293 Abnormal posture: Secondary | ICD-10-CM | POA: Diagnosis not present

## 2022-11-03 ENCOUNTER — Ambulatory Visit: Payer: HMO | Admitting: Physical Therapy

## 2022-11-03 ENCOUNTER — Encounter: Payer: Self-pay | Admitting: Physical Therapy

## 2022-11-03 DIAGNOSIS — R2681 Unsteadiness on feet: Secondary | ICD-10-CM | POA: Diagnosis not present

## 2022-11-03 DIAGNOSIS — R29898 Other symptoms and signs involving the musculoskeletal system: Secondary | ICD-10-CM | POA: Diagnosis not present

## 2022-11-03 DIAGNOSIS — R2689 Other abnormalities of gait and mobility: Secondary | ICD-10-CM

## 2022-11-03 DIAGNOSIS — M542 Cervicalgia: Secondary | ICD-10-CM | POA: Diagnosis not present

## 2022-11-03 DIAGNOSIS — G464 Cerebellar stroke syndrome: Secondary | ICD-10-CM | POA: Diagnosis not present

## 2022-11-03 DIAGNOSIS — R293 Abnormal posture: Secondary | ICD-10-CM | POA: Diagnosis not present

## 2022-11-03 NOTE — Therapy (Signed)
OUTPATIENT PHYSICAL THERAPY NEURO TREATMENT   Patient Name: [name] MRN: [id number] DOB:Mar 22, 1935, 88 y.o., male Today's Date: 11/03/2022   PCP: Cleatis Polka., MD  REFERRING PROVIDER: Horton Chin, MD  END OF SESSION:  PT End of Session - 11/03/22 1024     Visit Number 2    Number of Visits 5    Date for PT Re-Evaluation 11/25/22    Authorization Type [employer]    PT Start Time 1021    PT Stop Time 1100    PT Time Calculation (min) 39 min    Equipment Utilized During Treatment --    Activity Tolerance Patient tolerated treatment well    Behavior During Therapy WFL for tasks assessed/performed              Past Medical History:  Diagnosis Date   Acute renal failure (HCC)    secondary to nephrotic syndrome post cath, resolved with steroids   Anxiety    CAD (coronary artery disease) 04/2006   LIMA to LAD, sequential SVG to first, second, and third OM's, sequential SVG to mid RCA and PDA    Depression    Diabetes mellitus without complication (HCC)    ED (erectile dysfunction)    HTN (hypertension)    Hx of adenomatous colonic polyps    Hyperlipidemia    Loop recorder: Biotronic Biomonitor II loop 07/28/2021 07/28/2021   Obesity    Past Surgical History:  Procedure Laterality Date   COLONOSCOPY W/ BIOPSIES AND POLYPECTOMY     CORONARY ARTERY BYPASS GRAFT  2008   VESSELS X6   IR CT HEAD LTD  10/02/2022   IR PERCUTANEOUS ART THROMBECTOMY/INFUSION INTRACRANIAL INC DIAG ANGIO  10/02/2022   IR US GUIDE VASC ACCESS RIGHT  10/02/2022   LOOP RECORDER IMPLANT     RADIOLOGY WITH ANESTHESIA N/A 10/02/2022   Procedure: IR WITH ANESTHESIA;  Surgeon: Radiologist, Medication, MD;  Location: [facility] OR;  Service: Radiology;  Laterality: N/A;   UMBILICAL HERNIA REPAIR     Patient Active Problem List   Diagnosis Date Noted   Ischemic cerebrovascular accident (CVA) (HCC) 10/06/2022   Status post surgery 10/02/2022   Stroke: Multiple scattered bilateral  ischemic infarcts s/p thrombectomy of left P1/PCA TICI 3 Etiology: Likely cardioembolic 10/02/2022   Hemianopsia 10/01/2022   Right homonymous hemianopsia 10/01/2022   Loop recorder: Biotronic Biomonitor II loop 07/28/2021 07/28/2021   Unsteady gait 08/05/2020   BPH with obstruction/lower urinary tract symptoms 01/21/2020   Anxiety disorder 02/02/2018   Right hip pain 10/02/2017   Recurrent major depression in remission (HCC) 01/27/2017   Chronic kidney disease, stage 3a (HCC) 07/17/2015   Personal history of colonic adenomas 03/26/2013   Obesity 10/04/2012   OBSTRUCTIVE SLEEP APNEA 10/30/2008   HYPERLIPIDEMIA-MIXED 07/01/2008   HYPERTENSION, BENIGN 07/01/2008   CAD, NATIVE VESSEL 07/01/2008   FATIGUE / MALAISE 07/01/2008   EDEMA 07/01/2008    ONSET DATE: 10/02/22  REFERRING DIAG: I63.9 (ICD-10-CM) - Cerebral infarction, unspecified  THERAPY DIAG:  Unsteadiness on feet  Other abnormalities of gait and mobility  Rationale for Evaluation and Treatment: Rehabilitation  SUBJECTIVE:  SUBJECTIVE STATEMENT: Feeling kind of down about this; never been sick in my life.  Had a little trouble with the heel/toe walking exercise.  Pt accompanied by: self  PERTINENT HISTORY: Anxiety, CAD, depression, DM, HTN, HLD, loop recorder, CABG 2008  PAIN:  Are you having pain? No  PRECAUTIONS: Fall, ICD/Pacemaker, and Other: loop recorder  RED FLAGS: None   WEIGHT BEARING RESTRICTIONS: No  FALLS: Has patient fallen in last 6 months? Yes. Number of falls 1 son reports syncopal episode caused the fall  LIVING ENVIRONMENT: Lives with: lives alone Lives in: House/apartment Stairs:  2 steps to enter without handrail; 2nd story home but stays on 1st floor Has following equipment at home: Dan Humphreys - 2 wheeled  and walking stick  PLOF: Independent; has cleaning services   PATIENT GOALS: improve balance   OBJECTIVE:    TODAY'S TREATMENT: 11/03/2022 Activity Comments  Reviewed HEP Cues for posture, UE support as needed with tandem gait, cues to use visual target for improved posture  Alt step taps to 4" step x 10, then 6" step Light BUE support, cues for foot clearance  SLS with foot propped on 6" step, 3 reps x 10 sec No UE support, increased unsteadiness on RLE as stance  Forward/back walking at counter Cues for heel strike>toe off and toe>heel in backwards direction  Sidestepping, walking pole for support, 2 reps x 10 ft each direction Cues to lessen RLE external rotation  Gait training with walking pole 85 ft x 2, 2 reps, then 50 ft Cues for sequence with walking pole, improved step length noted; pt does have tendency to initially use pole with each foot, does better with repetition   HOME EXERCISE PROGRAM: Access Code: [email or web address] URL: [email or web address] Date: 11/03/2022 Prepared by: [facility] - Outpatient  Rehab - [facility]  Exercises - Narrow Stance with Counter Support  - 1 x daily - 5 x weekly - 2 sets - 30 sec hold - Romberg Stance with Eyes Closed  - 1 x daily - 5 x weekly - 2 sets - 30 sec hold - Tandem Walking with Counter Support  - 1 x daily - 5 x weekly - 2 sets - 10 reps - Standing Gastroc Stretch at Counter  - 1 x daily - 5 x weekly - 2 sets - 30 sec hold - Forward Step Touch  - 1 x daily - 7 x weekly - 2 sets - 10 reps  PATIENT EDUCATION: Education details: HEP additions Person educated: Patient Education method: Programmer, multimedia, Demonstration, Verbal cues, and Handouts Education comprehension: verbalized understanding, returned demonstration, and needs further education   -------------------------------------------------------------- Objective measures below taken at initial evaluation:  DIAGNOSTIC FINDINGS: 10/02/22 brain MRI: Small acute infarcts  scattered in the bilateral cerebellum (left greater than right). Punctate acute lacunar infarcts in both the left pons and thalamus. And minimal occipital lobe restricted diffusion, perhaps greater on the right.   10/01/22 head/neck angio CT: Short segment occlusion of the proximal left P1 segment, with additional moderate stenosis in the distal left P2 segment. Severe stenosis in the left supraclinoid ICA and moderate stenosis in the right supraclinoid ICA. 4. Severe stenosis at the origin of the right PICA. Mild stenosis in the distal basilar artery.   COGNITION: Overall cognitive status: Within functional limits for tasks assessed   SENSATION: Denies N/T in UEs/LEs   COORDINATION: Alternating pronation/supination: dysmetric L>R Alternating toe tap: dysmetric L>R Finger to nose: dysmetric L>R Heel to shin: intact B     MUSCLE  TONE: WNL but B calf very tight    POSTURE: rounded shoulders  LOWER EXTREMITY ROM:     Active  Right Eval Left Eval  Hip flexion    Hip extension    Hip abduction    Hip adduction    Hip internal rotation    Hip external rotation    Knee flexion    Knee extension    Ankle dorsiflexion 4 *knee bent 8 knee bent  Ankle plantarflexion    Ankle inversion    Ankle eversion     (Blank rows = not tested)   LOWER EXTREMITY MMT:    MMT (in sitting) Right Eval Left Eval  Hip flexion 4 4-  Hip extension    Hip abduction 4 3+  Hip adduction 3+ 3+  Hip internal rotation    Hip external rotation    Knee flexion 4 4  Knee extension 5 5  Ankle dorsiflexion 4+ 4  Ankle plantarflexion 4+ 4+  Ankle inversion    Ankle eversion    (Blank rows = not tested)   GAIT: Gait pattern: Slow, wide BOS, unsteady  Assistive device utilized: None Level of assistance: SBA   FUNCTIONAL TESTS:      PATIENT SURVEYS:  FOTO 47, 55  TODAY'S TREATMENT:                                                                                                                               DATE: 10/28/22    PATIENT EDUCATION: Education details: advised pt to bring in walker or walking sticks next sessiion and to use these for ambulation; prognosis, POC (agreed to work on balance only as he wants to continue working on strength at [employer] PT), HEP- advised we would not continue if HEP is not performed as pt admits to low motivation  Person educated: Patient and Child(ren) Education method: Explanation, Demonstration, Tactile cues, Verbal cues, and Handouts Education comprehension: verbalized understanding and returned demonstration  HOME EXERCISE PROGRAM: Access Code: [email or web address] URL: [email or web address] Date: 10/28/2022 Prepared by: [facility] - Outpatient  Rehab - [facility]  Exercises - Narrow Stance with Counter Support  - 1 x daily - 5 x weekly - 2 sets - 30 sec hold - Romberg Stance with Eyes Closed  - 1 x daily - 5 x weekly - 2 sets - 30 sec hold - Tandem Walking with Counter Support  - 1 x daily - 5 x weekly - 2 sets - 10 reps - Standing Gastroc Stretch at Counter  - 1 x daily - 5 x weekly - 2 sets - 30 sec hold  GOALS: Goals reviewed with patient? Yes  SHORT TERM GOALS: Target date: 11/11/2022  Patient to be independent with initial HEP. Baseline: HEP initiated Goal status: IN PROGRESS    LONG TERM GOALS: Target date: 11/25/2022  Patient to be independent with advanced HEP. Baseline: Not yet initiated  Goal status: IN PROGRESS  Patient to demonstrate  B ankle DF AROM to at least 10 deg to improve safety with gait.  Baseline: 4 deg, 8 deg with compensations  Goal status: IN PROGRESS  Patient to demonstrate alternating reciprocal pattern when ascending and descending stairs with good stability and 1 handrail as needed.   Baseline: NT Goal status: IN PROGRESS  Patient to complete TUG in <14 sec with LRAD in order to decrease risk of falls.   Baseline: 21.38 sec Goal status: IN PROGRESS  Patient to demonstrate  5xSTS test in <15 sec with good control and posture in order to decrease risk of falls.  Baseline: 13.92 sec with limited eccentric control and not standing fully Goal status: IN PROGRESS  Patient to score at least 47/56 on Berg in order to decrease risk of falls.  Baseline: 42 Goal status: IN PROGRESS  Patient to score at least 55 on FOTO in order to indicate improved functional outcomes.  Baseline: 47 Goal status: IN PROGRESS   ASSESSMENT:  CLINICAL IMPRESSION: Pt presents to OPPT today reporting he has been doing exercises at home, and is having difficulty with tandem gait.  Reviewed HEP with pt demo understanding; he does tend to have increased R lean towards counter with tandem gait.  He does better with cues for looking ahead at visual target with tandem gait.  Worked to address SLS with addition of exercise for step taps and SLS hold at bottom step.  Pt did bring in his walking pole, and worked to educate on sequence of walking pole to increase step length; he improves this with repetition throughout session.    OBJECTIVE IMPAIRMENTS: Abnormal gait, decreased balance, decreased coordination, and impaired flexibility.   ACTIVITY LIMITATIONS: carrying, lifting, bending, standing, stairs, transfers, bathing, toileting, dressing, reach over head, hygiene/grooming, and locomotion level  PARTICIPATION LIMITATIONS: meal prep, cleaning, laundry, driving, shopping, community activity, yard work, and church  PERSONAL FACTORS: Age, Behavior pattern, Past/current experiences, Time since onset of injury/illness/exacerbation, and 3+ comorbidities: Anxiety, CAD, depression, DM, HTN, HLD, loop recorder, CABG 2008  are also affecting patient's functional outcome.   REHAB POTENTIAL: Good  CLINICAL DECISION MAKING: Evolving/moderate complexity  EVALUATION COMPLEXITY: Moderate  PLAN:  PT FREQUENCY: 1x/week  PT DURATION: 4 weeks  PLANNED INTERVENTIONS: Therapeutic exercises, Therapeutic  activity, Neuromuscular re-education, Balance training, Gait training, Patient/Family education, Self Care, Joint mobilization, Stair training, Vestibular training, Canalith repositioning, DME instructions, Aquatic Therapy, Dry Needling, Electrical stimulation, Cryotherapy, Moist heat, Taping, Manual therapy, and Re-evaluation  PLAN FOR NEXT SESSION: review HEP updates and work on balance activities with narrow BOS, SLS (do not work on strength, he is seen at [facility] for this)   Lonia Blood, PT 11/03/22 11:06 AM Phone: [phone] Fax: [phone]  [facility] at [facility] [address], [address] [place], [place] [address] Phone # [phone] Fax # [phone]

## 2022-11-07 ENCOUNTER — Encounter (HOSPITAL_BASED_OUTPATIENT_CLINIC_OR_DEPARTMENT_OTHER): Payer: HMO | Admitting: Registered Nurse

## 2022-11-07 ENCOUNTER — Encounter: Payer: Self-pay | Admitting: Registered Nurse

## 2022-11-07 VITALS — BP 147/89 | HR 69 | Ht 69.0 in | Wt 177.0 lb

## 2022-11-07 DIAGNOSIS — I639 Cerebral infarction, unspecified: Secondary | ICD-10-CM

## 2022-11-07 DIAGNOSIS — E7849 Other hyperlipidemia: Secondary | ICD-10-CM | POA: Diagnosis not present

## 2022-11-07 DIAGNOSIS — I10 Essential (primary) hypertension: Secondary | ICD-10-CM

## 2022-11-07 NOTE — Patient Instructions (Addendum)
Call:  de Glori Luis, MD Follow up.   Specialties: Radiology, Interventional Radiology Why: Call for appointment Contact information: [address] [place] [address] [phone]

## 2022-11-07 NOTE — Progress Notes (Unsigned)
Subjective:    Patient ID: [name], male    DOB: 11-22-1934, 88 y.o.   MRN: [id number]  HPI: [name] is a 88 y.o. male who returns for  HFU appointmen for follow up of his Ischemic cerebrovascular accident, Essential Hypertension and Hyperlipidemia. He presented to [facility] ED  on 10/01/2022 with diplopia and difficulty with balance.   H&P: Dr Julieanne Manson:  This is a new problem. The current episode started less than 1 hour ago. The problem occurs constantly. The problem has not changed since onset.Pertinent negatives include no chest pain, no abdominal pain, no headaches and no shortness of breath. Nothing aggravates the symptoms. Nothing relieves the symptoms. He has tried nothing for the symptoms. The treatment provided no relief.   [name] is a 88 y.o. male with a past medical history significant for CAD, hypertension, hyperlipidemia, diabetes, and sleep apnea who presents for neurologic complaint.  According to patient and family, he was last normal approximately 1 hour prior to arrival at 7:45 PM when he noticed sudden onset of double vision and difficulty with ambulation due to unsteadiness.  He has never had this before.  No history of strokes reported.  No history of TIAs to his knowledge.  He was at his baseline without any symptoms and drove somewhere before this onset.  Denies any headache or neck pain.  Denies trauma.  Denies palpitations, chest pain, shortness of breath.  No history of A-fib to his knowledge.  No vomiting but had some mild nausea with that initially.  Denies any speech difficulties or any numbness tingling or weakness of extremities.   I was called to see patient in triage and on my evaluation, patient does have disconjugate gaze.  His right eye will not look laterally.  He does not have a facial droop and has intact sensation throughout.  Intact grip strength bilaterally and intact strength in the legs.  Finger-nose-finger was difficult due to the  vision changes but appeared intact.  Patient did have right-sided hemianopia in both eyes that he does not know when it began.   Patient was activated as a code stroke and quickly taken to CT scanner.  CT initially did not show evidence of acute bleed or acute stroke.  Neurology felt that although the onset is quite clear when the diplopia began, the hemianopia is uncertain.  Thus, patient is not a candidate for TNK at this time.   They recommended MRI and admission to medicine to [facility] for further stroke workup but get CTA and perfusion if possible initially.   CTA was completed and patient does have a P1 occlusion acutely.  There is penumbra and no core infarct at this time.   I coordinated and spoke with Dr. Stephannie Peters with teleneurology and Dr. Amada Jupiter with [facility] neurology team and they spoke with the interventional neuroradiologist who feels patient is a candidate for thrombectomy.  CT Head: WO Contrast:  IMPRESSION: 1. No acute intracranial process. 2. ASPECTS is 10.  CTA:  IMPRESSION: 1. Short segment occlusion of the proximal left P1 segment, with additional moderate stenosis in the distal left P2 segment. 2. No infarct core identified. Possible area of decreased perfusion in the superior left cerebellum or left occipital lobe. This could reflect decreased perfusion related to short segment left P1 occlusion. Superior cerebellar decreased perfusion 3. Severe stenosis in the left supraclinoid ICA and moderate stenosis in the right supraclinoid ICA. 4. Severe stenosis at the origin of the  right PICA. Mild stenosis in the distal basilar artery. 5. No hemodynamically significant stenosis in the neck. 6. Aortic atherosclerosis.  Mike Brain: WO Contrast:  IMPRESSION: 1. Small acute infarcts scattered in the bilateral cerebellum (left greater than right). Punctate acute lacunar infarcts in both the left pons and thalamus. And minimal occipital lobe  restricted diffusion, perhaps greater on the right. 2. No hemorrhagic transformation or intracranial mass effect. 3. Underlying chronic small vessel disease, including chronic thalamic microhemorrhages, and nonspecific ventricular enlargement.   He was transferred to [facility]. He underwent thrombectomy.   Mike. Fernandez was admitted to inpatient rehabilitation on 10/06/2022 and discharged home on 10/12/2022. He is receiving Outpatient Therapy at [facility]. He denies any pain. He rates his pain 0. Also reports his appetite is fair.   Daughter in room, all questions answered.   Mike. Fernandez was driving, discharge instructions stated no driving. This was discussed with DrRaulkar. Dr. Carlis Abbott spoke with patient and daughter, they discussed him driving. Dr Carlis Abbott gave Mike. Barringer instructions for driving.   Pain Inventory Average Pain 0 Pain Right Now 0 My pain is  No pain. Only problem with eye sight  and balance  LOCATION OF PAIN  No pain  BOWEL Number of stools per week: 1-2 Oral laxative use  stool softener   BLADDER Normal and Pads Taking medication for bladder control.   Mobility use a cane ability to climb steps?  yes do you drive?  yes Do you have any goals in this area?  yes  Function retired I need assistance with the following:  meal prep and household duties Do you have any goals in this area?  yes  Neuro/Psych trouble walking  Prior Studies Any changes since last visit?  no  Physicians involved in your care Any changes since last visit?  no   Family History  Problem Relation Age of Onset   Heart attack Mother    Clotting disorder Father        died of renal disease   Heart disease Sister    Cancer Sister    Colon cancer Neg Hx    Social History   Socioeconomic History   Marital status: Widowed    Spouse name: Not on file   Number of children: Not on file   Years of education: Not on file   Highest education level: Not on file   Occupational History   Occupation: Field seismologist  Tobacco Use   Smoking status: Never   Smokeless tobacco: Never  Vaping Use   Vaping status: Never Used  Substance and Sexual Activity   Alcohol use: No    Alcohol/week: 1.0 standard drink of alcohol    Types: 1 Glasses of wine per week   Drug use: No   Sexual activity: Not on file  Other Topics Concern   Not on file  Social History Narrative   Retired Surveyor, minerals.  Widowed since 2014.  He has multiple children.  Never smoker 5 caffeinated beverages daily and no alcohol   Social Determinants of Health   Financial Resource Strain: Not on file  Food Insecurity: Not on file  Transportation Needs: Not on file  Physical Activity: Not on file  Stress: Not on file  Social Connections: Not on file   Past Surgical History:  Procedure Laterality Date   COLONOSCOPY W/ BIOPSIES AND POLYPECTOMY     CORONARY ARTERY BYPASS GRAFT  2008   VESSELS X6   IR CT HEAD LTD  10/02/2022   IR  PERCUTANEOUS ART THROMBECTOMY/INFUSION INTRACRANIAL INC DIAG ANGIO  10/02/2022   IR US GUIDE VASC ACCESS RIGHT  10/02/2022   LOOP RECORDER IMPLANT     RADIOLOGY WITH ANESTHESIA N/A 10/02/2022   Procedure: IR WITH ANESTHESIA;  Surgeon: Radiologist, Medication, MD;  Location: [facility] OR;  Service: Radiology;  Laterality: N/A;   UMBILICAL HERNIA REPAIR     Past Medical History:  Diagnosis Date   Acute renal failure (HCC)    secondary to nephrotic syndrome post cath, resolved with steroids   Anxiety    CAD (coronary artery disease) 04/2006   LIMA to LAD, sequential SVG to first, second, and third OM's, sequential SVG to mid RCA and PDA    Depression    Diabetes mellitus without complication (HCC)    ED (erectile dysfunction)    HTN (hypertension)    Hx of adenomatous colonic polyps    Hyperlipidemia    Loop recorder: Biotronic Biomonitor II loop 07/28/2021 07/28/2021   Obesity    There were no vitals taken for this visit.  Opioid Risk Score:   Fall Risk  Score:  `1  Depression screen PHQ 2/9      No data to display          Review of Systems  Eyes:  Positive for visual disturbance.  Musculoskeletal:        Balance  All other systems reviewed and are negative.      Objective:   Physical Exam Vitals and nursing note reviewed.  Constitutional:      Appearance: Normal appearance.  Cardiovascular:     Rate and Rhythm: Normal rate and regular rhythm.     Pulses: Normal pulses.     Heart sounds: Normal heart sounds.  Pulmonary:     Effort: Pulmonary effort is normal.     Breath sounds: Normal breath sounds.  Musculoskeletal:     Cervical back: Normal range of motion and neck supple.     Right lower leg: Edema present.     Left lower leg: Edema present.     Comments: Normal Muscle Bulk and Muscle Testing Reveals:  Upper Extremities: Full ROM and Muscle Strength 5/5 Lower Extremities Full ROM and Muscle Strength 5/5 Arises from Table with ease Narrow Based Gait     Skin:    General: Skin is warm and dry.  Neurological:     Mental Status: He is alert and oriented to person, place, and time.  Psychiatric:        Mood and Affect: Mood normal.        Behavior: Behavior normal.         Assessment & Plan:  1.Ischemic cerebrovascular accident: Continue Outpatient Therapy at [facility]. He has a scheduled appointment with Neurology. Continue current medication regimen. Continue to Monitor. , 2. Essential Hypertension: Cardiology following. Continue current medication regimen. Continue to Monitor.  3.  Hyperlipidemia: Continue current medication regimen: PCP/Cardiology following. Continue to Monitor.   F/U with Dr. Carlis Abbott in 4-6 weeks

## 2022-11-09 DIAGNOSIS — L89329 Pressure ulcer of left buttock, unspecified stage: Secondary | ICD-10-CM | POA: Diagnosis not present

## 2022-11-11 ENCOUNTER — Ambulatory Visit: Payer: HMO

## 2022-11-11 DIAGNOSIS — E119 Type 2 diabetes mellitus without complications: Secondary | ICD-10-CM | POA: Diagnosis not present

## 2022-11-14 DIAGNOSIS — E782 Mixed hyperlipidemia: Secondary | ICD-10-CM | POA: Diagnosis not present

## 2022-11-15 ENCOUNTER — Other Ambulatory Visit: Payer: Self-pay | Admitting: Physical Medicine and Rehabilitation

## 2022-11-15 DIAGNOSIS — N401 Enlarged prostate with lower urinary tract symptoms: Secondary | ICD-10-CM | POA: Diagnosis not present

## 2022-11-15 DIAGNOSIS — N1831 Chronic kidney disease, stage 3a: Secondary | ICD-10-CM | POA: Diagnosis not present

## 2022-11-15 DIAGNOSIS — E1129 Type 2 diabetes mellitus with other diabetic kidney complication: Secondary | ICD-10-CM | POA: Diagnosis not present

## 2022-11-15 DIAGNOSIS — I129 Hypertensive chronic kidney disease with stage 1 through stage 4 chronic kidney disease, or unspecified chronic kidney disease: Secondary | ICD-10-CM | POA: Diagnosis not present

## 2022-11-15 DIAGNOSIS — E782 Mixed hyperlipidemia: Secondary | ICD-10-CM | POA: Diagnosis not present

## 2022-11-17 ENCOUNTER — Other Ambulatory Visit: Payer: Self-pay | Admitting: Physical Medicine and Rehabilitation

## 2022-11-17 ENCOUNTER — Ambulatory Visit: Payer: HMO | Admitting: Cardiology

## 2022-11-17 ENCOUNTER — Telehealth: Payer: Self-pay

## 2022-11-17 VITALS — BP 148/76 | HR 76

## 2022-11-17 DIAGNOSIS — R293 Abnormal posture: Secondary | ICD-10-CM | POA: Diagnosis not present

## 2022-11-17 DIAGNOSIS — I10 Essential (primary) hypertension: Secondary | ICD-10-CM

## 2022-11-17 DIAGNOSIS — R55 Syncope and collapse: Secondary | ICD-10-CM

## 2022-11-17 DIAGNOSIS — Z95818 Presence of other cardiac implants and grafts: Secondary | ICD-10-CM | POA: Diagnosis not present

## 2022-11-17 DIAGNOSIS — I4819 Other persistent atrial fibrillation: Secondary | ICD-10-CM

## 2022-11-17 DIAGNOSIS — R29898 Other symptoms and signs involving the musculoskeletal system: Secondary | ICD-10-CM | POA: Diagnosis not present

## 2022-11-17 DIAGNOSIS — M542 Cervicalgia: Secondary | ICD-10-CM | POA: Diagnosis not present

## 2022-11-17 DIAGNOSIS — G464 Cerebellar stroke syndrome: Secondary | ICD-10-CM | POA: Diagnosis not present

## 2022-11-17 DIAGNOSIS — Z4509 Encounter for adjustment and management of other cardiac device: Secondary | ICD-10-CM | POA: Diagnosis not present

## 2022-11-17 MED ORDER — VERAPAMIL HCL ER 120 MG PO TBCR
120.0000 mg | EXTENDED_RELEASE_TABLET | Freq: Every day | ORAL | 2 refills | Status: DC
Start: 2022-11-17 — End: 2022-12-01

## 2022-11-17 NOTE — Telephone Encounter (Signed)
Patient has already been seen today.

## 2022-11-17 NOTE — Telephone Encounter (Signed)
Called patient to schedule an pacemaker check for Friday Aug 8th @ 3:20pm. If patient calls back, please let him know that this appointment is important and he needs to be here.

## 2022-11-18 ENCOUNTER — Ambulatory Visit: Payer: HMO | Attending: Physical Medicine and Rehabilitation

## 2022-11-18 DIAGNOSIS — I639 Cerebral infarction, unspecified: Secondary | ICD-10-CM | POA: Diagnosis not present

## 2022-11-18 DIAGNOSIS — R2689 Other abnormalities of gait and mobility: Secondary | ICD-10-CM | POA: Diagnosis not present

## 2022-11-18 DIAGNOSIS — R2681 Unsteadiness on feet: Secondary | ICD-10-CM | POA: Insufficient documentation

## 2022-11-18 NOTE — Therapy (Signed)
OUTPATIENT PHYSICAL THERAPY NEURO TREATMENT   Patient Name: [name] MRN: [id number] DOB:06/05/1934, 88 y.o., male Today's Date: 11/18/2022   PCP: Cleatis Polka., MD  REFERRING PROVIDER: Horton Chin, MD  END OF SESSION:  PT End of Session - 11/18/22 1101     Visit Number 3    Number of Visits 5    Date for PT Re-Evaluation 11/25/22    Authorization Type [employer]    PT Start Time 1101    PT Stop Time 1145    PT Time Calculation (min) 44 min    Activity Tolerance Patient tolerated treatment well    Behavior During Therapy [facility] for tasks assessed/performed              Past Medical History:  Diagnosis Date   Acute renal failure (HCC)    secondary to nephrotic syndrome post cath, resolved with steroids   Anxiety    CAD (coronary artery disease) 04/2006   LIMA to LAD, sequential SVG to first, second, and third OM's, sequential SVG to mid RCA and PDA    Depression    Diabetes mellitus without complication (HCC)    ED (erectile dysfunction)    HTN (hypertension)    Hx of adenomatous colonic polyps    Hyperlipidemia    Loop recorder: Biotronic Biomonitor II loop 07/28/2021 07/28/2021   Obesity    Past Surgical History:  Procedure Laterality Date   COLONOSCOPY W/ BIOPSIES AND POLYPECTOMY     CORONARY ARTERY BYPASS GRAFT  2008   VESSELS X6   IR CT HEAD LTD  10/02/2022   IR PERCUTANEOUS ART THROMBECTOMY/INFUSION INTRACRANIAL INC DIAG ANGIO  10/02/2022   IR US GUIDE VASC ACCESS RIGHT  10/02/2022   LOOP RECORDER IMPLANT     RADIOLOGY WITH ANESTHESIA N/A 10/02/2022   Procedure: IR WITH ANESTHESIA;  Surgeon: Radiologist, Medication, MD;  Location: [facility] OR;  Service: Radiology;  Laterality: N/A;   UMBILICAL HERNIA REPAIR     Patient Active Problem List   Diagnosis Date Noted   Ischemic cerebrovascular accident (CVA) (HCC) 10/06/2022   Status post surgery 10/02/2022   Stroke: Multiple scattered bilateral ischemic infarcts s/p thrombectomy of left P1/PCA  TICI 3 Etiology: Likely cardioembolic 10/02/2022   Hemianopsia 10/01/2022   Right homonymous hemianopsia 10/01/2022   Loop recorder: Biotronic Biomonitor II loop 07/28/2021 07/28/2021   Unsteady gait 08/05/2020   BPH with obstruction/lower urinary tract symptoms 01/21/2020   Anxiety disorder 02/02/2018   Right hip pain 10/02/2017   Recurrent major depression in remission (HCC) 01/27/2017   Chronic kidney disease, stage 3a (HCC) 07/17/2015   Personal history of colonic adenomas 03/26/2013   Obesity 10/04/2012   OBSTRUCTIVE SLEEP APNEA 10/30/2008   HYPERLIPIDEMIA-MIXED 07/01/2008   HYPERTENSION, BENIGN 07/01/2008   CAD, NATIVE VESSEL 07/01/2008   FATIGUE / MALAISE 07/01/2008   EDEMA 07/01/2008    ONSET DATE: 10/02/22  REFERRING DIAG: I63.9 (ICD-10-CM) - Cerebral infarction, unspecified  THERAPY DIAG:  Unsteadiness on feet  Other abnormalities of gait and mobility  Rationale for Evaluation and Treatment: Rehabilitation  SUBJECTIVE:  SUBJECTIVE STATEMENT: Not seeing much progress  Pt accompanied by: self  PERTINENT HISTORY: Anxiety, CAD, depression, DM, HTN, HLD, loop recorder, CABG 2008  PAIN:  Are you having pain? No  PRECAUTIONS: Fall, ICD/Pacemaker, and Other: loop recorder  RED FLAGS: None   WEIGHT BEARING RESTRICTIONS: No  FALLS: Has patient fallen in last 6 months? Yes. Number of falls 1 son reports syncopal episode caused the fall  LIVING ENVIRONMENT: Lives with: lives alone Lives in: House/apartment Stairs:  2 steps to enter without handrail; 2nd story home but stays on 1st floor Has following equipment at home: Dan Humphreys - 2 wheeled and walking stick  PLOF: Independent; has cleaning services   PATIENT GOALS: improve balance   OBJECTIVE:   TODAY'S TREATMENT:  11/18/22 Activity Comments  M-CTSIB Condition 1: normal Condition 2: mild Condition 3: mod Condition 4: mod-severe  Suitcase carry Alternating hands Q 25 ft x 4 rounds  Sit to stand to alt stair taps 3x5 reps 6" box, light UE touch for support  Foot on step 1x15 sec EO/EC 1x3 reps head turns            TODAY'S TREATMENT: 11/03/2022 Activity Comments  Reviewed HEP Cues for posture, UE support as needed with tandem gait, cues to use visual target for improved posture  Alt step taps to 4" step x 10, then 6" step Light BUE support, cues for foot clearance  SLS with foot propped on 6" step, 3 reps x 10 sec No UE support, increased unsteadiness on RLE as stance  Forward/back walking at counter Cues for heel strike>toe off and toe>heel in backwards direction  Sidestepping, walking pole for support, 2 reps x 10 ft each direction Cues to lessen RLE external rotation  Gait training with walking pole 85 ft x 2, 2 reps, then 50 ft Cues for sequence with walking pole, improved step length noted; pt does have tendency to initially use pole with each foot, does better with repetition   HOME EXERCISE PROGRAM: Access Code: [email or web address] URL: [email or web address] Date: 11/03/2022 Prepared by: [facility] - Outpatient  Rehab - [facility]  Exercises - Narrow Stance with Counter Support  - 1 x daily - 5 x weekly - 2 sets - 30 sec hold - Romberg Stance with Eyes Closed  - 1 x daily - 5 x weekly - 2 sets - 30 sec hold - Tandem Walking with Counter Support  - 1 x daily - 5 x weekly - 2 sets - 10 reps - Standing Gastroc Stretch at Counter  - 1 x daily - 5 x weekly - 2 sets - 30 sec hold - Forward Step Touch  - 1 x daily - 7 x weekly - 2 sets - 10 reps  PATIENT EDUCATION: Education details: HEP additions Person educated: Patient Education method: Programmer, multimedia, Demonstration, Verbal cues, and Handouts Education comprehension: verbalized understanding, returned demonstration, and needs further  education   -------------------------------------------------------------- Objective measures below taken at initial evaluation:  DIAGNOSTIC FINDINGS: 10/02/22 brain MRI: Small acute infarcts scattered in the bilateral cerebellum (left greater than right). Punctate acute lacunar infarcts in both the left pons and thalamus. And minimal occipital lobe restricted diffusion, perhaps greater on the right.   10/01/22 head/neck angio CT: Short segment occlusion of the proximal left P1 segment, with additional moderate stenosis in the distal left P2 segment. Severe stenosis in the left supraclinoid ICA and moderate stenosis in the right supraclinoid ICA. 4. Severe stenosis at the origin of the right PICA. Mild stenosis in  the distal basilar artery.   COGNITION: Overall cognitive status: Within functional limits for tasks assessed   SENSATION: Denies N/T in UEs/LEs   COORDINATION: Alternating pronation/supination: dysmetric L>R Alternating toe tap: dysmetric L>R Finger to nose: dysmetric L>R Heel to shin: intact B     MUSCLE TONE: WNL but B calf very tight    POSTURE: rounded shoulders  LOWER EXTREMITY ROM:     Active  Right Eval Left Eval  Hip flexion    Hip extension    Hip abduction    Hip adduction    Hip internal rotation    Hip external rotation    Knee flexion    Knee extension    Ankle dorsiflexion 4 *knee bent 8 knee bent  Ankle plantarflexion    Ankle inversion    Ankle eversion     (Blank rows = not tested)   LOWER EXTREMITY MMT:    MMT (in sitting) Right Eval Left Eval  Hip flexion 4 4-  Hip extension    Hip abduction 4 3+  Hip adduction 3+ 3+  Hip internal rotation    Hip external rotation    Knee flexion 4 4  Knee extension 5 5  Ankle dorsiflexion 4+ 4  Ankle plantarflexion 4+ 4+  Ankle inversion    Ankle eversion    (Blank rows = not tested)   GAIT: Gait pattern: Slow, wide BOS, unsteady  Assistive device utilized: None Level of  assistance: SBA   FUNCTIONAL TESTS:      PATIENT SURVEYS:  FOTO 47, 55    GOALS: Goals reviewed with patient? Yes  SHORT TERM GOALS: Target date: 11/11/2022  Patient to be independent with initial HEP. Baseline: HEP initiated Goal status: MET    LONG TERM GOALS: Target date: 11/25/2022  Patient to be independent with advanced HEP. Baseline: Not yet initiated  Goal status: IN PROGRESS  Patient to demonstrate B ankle DF AROM to at least 10 deg to improve safety with gait.  Baseline: 4 deg, 8 deg with compensations  Goal status: IN PROGRESS  Patient to demonstrate alternating reciprocal pattern when ascending and descending stairs with good stability and 1 handrail as needed.   Baseline: NT Goal status: IN PROGRESS  Patient to complete TUG in <14 sec with LRAD in order to decrease risk of falls.   Baseline: 21.38 sec Goal status: IN PROGRESS  Patient to demonstrate 5xSTS test in <15 sec with good control and posture in order to decrease risk of falls.  Baseline: 13.92 sec with limited eccentric control and not standing fully Goal status: IN PROGRESS  Patient to score at least 47/56 on Berg in order to decrease risk of falls.  Baseline: 42 Goal status: IN PROGRESS  Patient to score at least 55 on FOTO in order to indicate improved functional outcomes.  Baseline: 47 Goal status: IN PROGRESS   ASSESSMENT:  CLINICAL IMPRESSION: Returns to clinic and reports continued issues with balance requiring occasional use of walking stick for stability. Continued activities to enhance static and dynamic balance with emphasis on postural stability during external demands such as carrying resistance, compliant surfaces, and single limb support with multi-sensory demands.  Difficulty with single limb stability often requiring light UE support for feedback and difficulty with single limb support and lib advancement such as advancing to step foot up on surfaces.  Demo 100% HEP recall.  Will perform re-assessment at next session to determine recert vs D/C.  Discussed activity with other PT sessions and I  discussed benefits of routine strength training for its myriad of benefits.  OBJECTIVE IMPAIRMENTS: Abnormal gait, decreased balance, decreased coordination, and impaired flexibility.   ACTIVITY LIMITATIONS: carrying, lifting, bending, standing, stairs, transfers, bathing, toileting, dressing, reach over head, hygiene/grooming, and locomotion level  PARTICIPATION LIMITATIONS: meal prep, cleaning, laundry, driving, shopping, community activity, yard work, and church  PERSONAL FACTORS: Age, Behavior pattern, Past/current experiences, Time since onset of injury/illness/exacerbation, and 3+ comorbidities: Anxiety, CAD, depression, DM, HTN, HLD, loop recorder, CABG 2008  are also affecting patient's functional outcome.   REHAB POTENTIAL: Good  CLINICAL DECISION MAKING: Evolving/moderate complexity  EVALUATION COMPLEXITY: Moderate  PLAN:  PT FREQUENCY: 1x/week  PT DURATION: 4 weeks  PLANNED INTERVENTIONS: Therapeutic exercises, Therapeutic activity, Neuromuscular re-education, Balance training, Gait training, Patient/Family education, Self Care, Joint mobilization, Stair training, Vestibular training, Canalith repositioning, DME instructions, Aquatic Therapy, Dry Needling, Electrical stimulation, Cryotherapy, Moist heat, Taping, Manual therapy, and Re-evaluation  PLAN FOR NEXT SESSION: review LTG and discuss D/C vs recert   12:02 PM, 11/18/22 M. Shary Decamp, PT, DPT Physical Therapist- [place] Office Number: [phone]

## 2022-11-21 DIAGNOSIS — I700 Atherosclerosis of aorta: Secondary | ICD-10-CM | POA: Diagnosis not present

## 2022-11-21 DIAGNOSIS — D6869 Other thrombophilia: Secondary | ICD-10-CM | POA: Diagnosis not present

## 2022-11-21 DIAGNOSIS — D692 Other nonthrombocytopenic purpura: Secondary | ICD-10-CM | POA: Diagnosis not present

## 2022-11-21 DIAGNOSIS — Z0000 Encounter for general adult medical examination without abnormal findings: Secondary | ICD-10-CM | POA: Diagnosis not present

## 2022-11-21 DIAGNOSIS — Z1339 Encounter for screening examination for other mental health and behavioral disorders: Secondary | ICD-10-CM | POA: Diagnosis not present

## 2022-11-21 DIAGNOSIS — Z87898 Personal history of other specified conditions: Secondary | ICD-10-CM | POA: Diagnosis not present

## 2022-11-21 DIAGNOSIS — L89152 Pressure ulcer of sacral region, stage 2: Secondary | ICD-10-CM | POA: Diagnosis not present

## 2022-11-21 DIAGNOSIS — Z1331 Encounter for screening for depression: Secondary | ICD-10-CM | POA: Diagnosis not present

## 2022-11-21 DIAGNOSIS — R82998 Other abnormal findings in urine: Secondary | ICD-10-CM | POA: Diagnosis not present

## 2022-11-21 DIAGNOSIS — E782 Mixed hyperlipidemia: Secondary | ICD-10-CM | POA: Diagnosis not present

## 2022-11-21 DIAGNOSIS — N1831 Chronic kidney disease, stage 3a: Secondary | ICD-10-CM | POA: Diagnosis not present

## 2022-11-21 DIAGNOSIS — R600 Localized edema: Secondary | ICD-10-CM | POA: Diagnosis not present

## 2022-11-21 DIAGNOSIS — E1129 Type 2 diabetes mellitus with other diabetic kidney complication: Secondary | ICD-10-CM | POA: Diagnosis not present

## 2022-11-21 DIAGNOSIS — Z23 Encounter for immunization: Secondary | ICD-10-CM | POA: Diagnosis not present

## 2022-11-21 DIAGNOSIS — Z8673 Personal history of transient ischemic attack (TIA), and cerebral infarction without residual deficits: Secondary | ICD-10-CM | POA: Diagnosis not present

## 2022-11-21 DIAGNOSIS — I25810 Atherosclerosis of coronary artery bypass graft(s) without angina pectoris: Secondary | ICD-10-CM | POA: Diagnosis not present

## 2022-11-21 DIAGNOSIS — I129 Hypertensive chronic kidney disease with stage 1 through stage 4 chronic kidney disease, or unspecified chronic kidney disease: Secondary | ICD-10-CM | POA: Diagnosis not present

## 2022-11-21 DIAGNOSIS — I480 Paroxysmal atrial fibrillation: Secondary | ICD-10-CM | POA: Diagnosis not present

## 2022-11-21 DIAGNOSIS — F3341 Major depressive disorder, recurrent, in partial remission: Secondary | ICD-10-CM | POA: Diagnosis not present

## 2022-11-21 DIAGNOSIS — I10 Essential (primary) hypertension: Secondary | ICD-10-CM | POA: Diagnosis not present

## 2022-11-22 DIAGNOSIS — R293 Abnormal posture: Secondary | ICD-10-CM | POA: Diagnosis not present

## 2022-11-22 DIAGNOSIS — Z4509 Encounter for adjustment and management of other cardiac device: Secondary | ICD-10-CM | POA: Diagnosis not present

## 2022-11-22 DIAGNOSIS — R29898 Other symptoms and signs involving the musculoskeletal system: Secondary | ICD-10-CM | POA: Diagnosis not present

## 2022-11-22 DIAGNOSIS — M542 Cervicalgia: Secondary | ICD-10-CM | POA: Diagnosis not present

## 2022-11-22 DIAGNOSIS — R55 Syncope and collapse: Secondary | ICD-10-CM | POA: Diagnosis not present

## 2022-11-22 DIAGNOSIS — Z95818 Presence of other cardiac implants and grafts: Secondary | ICD-10-CM | POA: Diagnosis not present

## 2022-11-22 DIAGNOSIS — G464 Cerebellar stroke syndrome: Secondary | ICD-10-CM | POA: Diagnosis not present

## 2022-11-23 DIAGNOSIS — E1129 Type 2 diabetes mellitus with other diabetic kidney complication: Secondary | ICD-10-CM | POA: Diagnosis not present

## 2022-11-23 DIAGNOSIS — I25810 Atherosclerosis of coronary artery bypass graft(s) without angina pectoris: Secondary | ICD-10-CM | POA: Diagnosis not present

## 2022-11-23 DIAGNOSIS — I129 Hypertensive chronic kidney disease with stage 1 through stage 4 chronic kidney disease, or unspecified chronic kidney disease: Secondary | ICD-10-CM | POA: Diagnosis not present

## 2022-11-23 DIAGNOSIS — N1831 Chronic kidney disease, stage 3a: Secondary | ICD-10-CM | POA: Diagnosis not present

## 2022-11-23 NOTE — Progress Notes (Signed)
Chief Complaint  Patient presents with   Atrial Fibrillation   Loop recorder    In person check      ICD-10-CM   1. Persistent atrial fibrillation (HCC)  I48.19 verapamil (CALAN-SR) 120 MG CR tablet    2. Primary hypertension  I10     3. Encounter for loop recorder check [Z45.09]  Z45.09     4. Implantable loop recorder present [Z95.818]  Z95.818     5. Syncope and collapse [R55]  R55      Meds ordered this encounter  Medications   verapamil (CALAN-SR) 120 MG CR tablet    Sig: Take 1 tablet (120 mg total) by mouth at bedtime.    Dispense:  30 tablet    Refill:  2    In person loop recorder check 11/17/2022: Patient with persistent atrial fibrillation since 10/03/2022.  Atrial fibrillation burden underestimated.  There were 16 episodes of A-fib with RVR with average ventricular rate of 125 to 162 bpm. A-fib detection set to medium and high ventricular rate set from 140 bpm to 160 bpm. Battery voltage approximately 3 years.  No pauses, no symptoms.     11/17/2022    1:32 PM 11/07/2022   10:39 AM 11/07/2022   10:28 AM  Vitals with BMI  Height   5\' 9"   Weight   177 lbs  BMI   26.13  Systolic 148 147 161  Diastolic 76 89 78  Pulse 76  69     In view of elevated blood pressure, A-fib with RVR, will add verapamil 120 mg daily.

## 2022-11-25 ENCOUNTER — Encounter: Payer: Self-pay | Admitting: Physical Therapy

## 2022-11-25 ENCOUNTER — Ambulatory Visit: Payer: HMO | Admitting: Physical Therapy

## 2022-11-25 DIAGNOSIS — R2689 Other abnormalities of gait and mobility: Secondary | ICD-10-CM

## 2022-11-25 DIAGNOSIS — R2681 Unsteadiness on feet: Secondary | ICD-10-CM | POA: Diagnosis not present

## 2022-11-25 NOTE — Therapy (Signed)
OUTPATIENT PHYSICAL THERAPY NEURO TREATMENT/RECERT   Patient Name: [name] MRN: [id number] DOB:1934-07-31, 88 y.o., male Today's Date: 11/25/2022   PCP: Cleatis Polka., MD  REFERRING PROVIDER: Horton Chin, MD  END OF SESSION:  PT End of Session - 11/25/22 1018     Visit Number 4    Number of Visits 16    Date for PT Re-Evaluation 01/06/23    Authorization Type [employer]    Progress Note Due on Visit 10    PT Start Time 1020    PT Stop Time 1101    PT Time Calculation (min) 41 min    Activity Tolerance Patient tolerated treatment well    Behavior During Therapy WFL for tasks assessed/performed               Past Medical History:  Diagnosis Date   Acute renal failure (HCC)    secondary to nephrotic syndrome post cath, resolved with steroids   Anxiety    CAD (coronary artery disease) 04/2006   LIMA to LAD, sequential SVG to first, second, and third OM's, sequential SVG to mid RCA and PDA    Depression    Diabetes mellitus without complication (HCC)    ED (erectile dysfunction)    HTN (hypertension)    Hx of adenomatous colonic polyps    Hyperlipidemia    Loop recorder: Biotronic Biomonitor II loop 07/28/2021 07/28/2021   Obesity    Past Surgical History:  Procedure Laterality Date   COLONOSCOPY W/ BIOPSIES AND POLYPECTOMY     CORONARY ARTERY BYPASS GRAFT  2008   VESSELS X6   IR CT HEAD LTD  10/02/2022   IR PERCUTANEOUS ART THROMBECTOMY/INFUSION INTRACRANIAL INC DIAG ANGIO  10/02/2022   IR US GUIDE VASC ACCESS RIGHT  10/02/2022   LOOP RECORDER IMPLANT     RADIOLOGY WITH ANESTHESIA N/A 10/02/2022   Procedure: IR WITH ANESTHESIA;  Surgeon: Radiologist, Medication, MD;  Location: [facility] OR;  Service: Radiology;  Laterality: N/A;   UMBILICAL HERNIA REPAIR     Patient Active Problem List   Diagnosis Date Noted   Ischemic cerebrovascular accident (CVA) (HCC) 10/06/2022   Status post surgery 10/02/2022   Stroke: Multiple scattered bilateral  ischemic infarcts s/p thrombectomy of left P1/PCA TICI 3 Etiology: Likely cardioembolic 10/02/2022   Hemianopsia 10/01/2022   Right homonymous hemianopsia 10/01/2022   Loop recorder: Biotronic Biomonitor II loop 07/28/2021 07/28/2021   Unsteady gait 08/05/2020   BPH with obstruction/lower urinary tract symptoms 01/21/2020   Anxiety disorder 02/02/2018   Right hip pain 10/02/2017   Recurrent major depression in remission (HCC) 01/27/2017   Chronic kidney disease, stage 3a (HCC) 07/17/2015   Personal history of colonic adenomas 03/26/2013   Obesity 10/04/2012   OBSTRUCTIVE SLEEP APNEA 10/30/2008   HYPERLIPIDEMIA-MIXED 07/01/2008   HYPERTENSION, BENIGN 07/01/2008   CAD, NATIVE VESSEL 07/01/2008   FATIGUE / MALAISE 07/01/2008   EDEMA 07/01/2008    ONSET DATE: 10/02/22  REFERRING DIAG: I63.9 (ICD-10-CM) - Cerebral infarction, unspecified  THERAPY DIAG:  Unsteadiness on feet  Other abnormalities of gait and mobility  Rationale for Evaluation and Treatment: Rehabilitation  SUBJECTIVE:  SUBJECTIVE STATEMENT: Not feeling so good today or yesterday.  My blood pressure has been low.  Pt accompanied by: self  PERTINENT HISTORY: Anxiety, CAD, depression, DM, HTN, HLD, loop recorder, CABG 2008  PAIN:  Are you having pain? No  PRECAUTIONS: Fall, ICD/Pacemaker, and Other: loop recorder .  Pt reports 11/25/2022 visit that he has wound on buttocks from "when in the hospital"  RED FLAGS: None   WEIGHT BEARING RESTRICTIONS: No  FALLS: Has patient fallen in last 6 months? Yes. Number of falls 1 son reports syncopal episode caused the fall  LIVING ENVIRONMENT: Lives with: lives alone Lives in: House/apartment Stairs:  2 steps to enter without handrail; 2nd story home but stays on 1st floor Has following  equipment at home: Dan Humphreys - 2 wheeled and walking stick  PLOF: Independent; has cleaning services   PATIENT GOALS: improve balance   OBJECTIVE:    TODAY'S TREATMENT: 11/25/2022 Activity Comments  Vitals 123/63 HR 78 bpm sitting  Vitals 117/60 HR 88 bpm After 1 min standing  TUG:  19.12 sec   FTSTS:16.25 sec   Ankle dorsiflexion R 10 degrees L 10 degrees   Berg Balance test:  40/56 41/56 at eval  Stair negotiation 2-3 steps with 1 rails, step through pattern Slowed pace with descent   PATIENT EDUCATION: Education details: POC, decreased progress towards goals-discussed increasing frequency to work more consistently on balance issues; discussed and recommended that pt follow up with MD regarding not feeling well-low BP, decreased energy, more pain in buttocks sore Person educated: Patient Education method: Explanation Education comprehension: verbalized understanding   TODAY'S TREATMENT: 11/18/22 Activity Comments  M-CTSIB Condition 1: normal Condition 2: mild Condition 3: mod Condition 4: mod-severe  Suitcase carry Alternating hands Q 25 ft x 4 rounds  Sit to stand to alt stair taps 3x5 reps 6" box, light UE touch for support  Foot on step 1x15 sec EO/EC 1x3 reps head turns            TODAY'S TREATMENT: 11/03/2022 Activity Comments  Reviewed HEP Cues for posture, UE support as needed with tandem gait, cues to use visual target for improved posture  Alt step taps to 4" step x 10, then 6" step Light BUE support, cues for foot clearance  SLS with foot propped on 6" step, 3 reps x 10 sec No UE support, increased unsteadiness on RLE as stance  Forward/back walking at counter Cues for heel strike>toe off and toe>heel in backwards direction  Sidestepping, walking pole for support, 2 reps x 10 ft each direction Cues to lessen RLE external rotation  Gait training with walking pole 85 ft x 2, 2 reps, then 50 ft Cues for sequence with walking pole, improved step length noted; pt does  have tendency to initially use pole with each foot, does better with repetition   HOME EXERCISE PROGRAM: Access Code: [email or web address] URL: [email or web address] Date: 11/03/2022 Prepared by: [facility] - Outpatient  Rehab - [facility]  Exercises - Narrow Stance with Counter Support  - 1 x daily - 5 x weekly - 2 sets - 30 sec hold - Romberg Stance with Eyes Closed  - 1 x daily - 5 x weekly - 2 sets - 30 sec hold - Tandem Walking with Counter Support  - 1 x daily - 5 x weekly - 2 sets - 10 reps - Standing Gastroc Stretch at Counter  - 1 x daily - 5 x weekly - 2 sets - 30 sec hold - Forward Step  Touch  - 1 x daily - 7 x weekly - 2 sets - 10 reps  PATIENT EDUCATION: Education details: HEP additions Person educated: Patient Education method: Explanation, Demonstration, Verbal cues, and Handouts Education comprehension: verbalized understanding, returned demonstration, and needs further education   -------------------------------------------------------------- Objective measures below taken at initial evaluation:  DIAGNOSTIC FINDINGS: 10/02/22 brain MRI: Small acute infarcts scattered in the bilateral cerebellum (left greater than right). Punctate acute lacunar infarcts in both the left pons and thalamus. And minimal occipital lobe restricted diffusion, perhaps greater on the right.   10/01/22 head/neck angio CT: Short segment occlusion of the proximal left P1 segment, with additional moderate stenosis in the distal left P2 segment. Severe stenosis in the left supraclinoid ICA and moderate stenosis in the right supraclinoid ICA. 4. Severe stenosis at the origin of the right PICA. Mild stenosis in the distal basilar artery.   COGNITION: Overall cognitive status: Within functional limits for tasks assessed   SENSATION: Denies N/T in UEs/LEs   COORDINATION: Alternating pronation/supination: dysmetric L>R Alternating toe tap: dysmetric L>R Finger to nose: dysmetric  L>R Heel to shin: intact B     MUSCLE TONE: WNL but B calf very tight    POSTURE: rounded shoulders  LOWER EXTREMITY ROM:     Active  Right Eval Left Eval  Hip flexion    Hip extension    Hip abduction    Hip adduction    Hip internal rotation    Hip external rotation    Knee flexion    Knee extension    Ankle dorsiflexion 4 *knee bent 8 knee bent  Ankle plantarflexion    Ankle inversion    Ankle eversion     (Blank rows = not tested)   LOWER EXTREMITY MMT:    MMT (in sitting) Right Eval Left Eval  Hip flexion 4 4-  Hip extension    Hip abduction 4 3+  Hip adduction 3+ 3+  Hip internal rotation    Hip external rotation    Knee flexion 4 4  Knee extension 5 5  Ankle dorsiflexion 4+ 4  Ankle plantarflexion 4+ 4+  Ankle inversion    Ankle eversion    (Blank rows = not tested)   GAIT: Gait pattern: Slow, wide BOS, unsteady  Assistive device utilized: None Level of assistance: SBA   FUNCTIONAL TESTS:   [facility] PT Assessment - 11/25/22 0001       Standardized Balance Assessment   Standardized Balance Assessment Berg Balance Test      Berg Balance Test   Sit to Stand Able to stand  independently using hands    Standing Unsupported Able to stand safely 2 minutes    Sitting with Back Unsupported but Feet Supported on Floor or Stool Able to sit safely and securely 2 minutes    Stand to Sit Controls descent by using hands    Transfers Able to transfer safely, definite need of hands    Standing Unsupported with Eyes Closed Able to stand 10 seconds safely    Standing Unsupported with Feet Together Able to place feet together independently and stand 1 minute safely    From Standing, Reach Forward with Outstretched Arm Can reach forward >12 cm safely (5")    From Standing Position, Pick up Object from Floor Able to pick up shoe safely and easily    From Standing Position, Turn to Look Behind Over each Shoulder Looks behind from both sides and weight shifts well     Turn  360 Degrees Needs assistance while turning    Standing Unsupported, Alternately Place Feet on Step/Stool Able to complete >2 steps/needs minimal assist    Standing Unsupported, One Foot in Front Able to plae foot ahead of the other independently and hold 30 seconds    Standing on One Leg Unable to try or needs assist to prevent fall    Total Score 40               PATIENT SURVEYS:  FOTO 47, 55    GOALS: Goals reviewed with patient? Yes  SHORT TERM GOALS: Target date: 11/11/2022  Patient to be independent with initial HEP. Baseline: HEP initiated Goal status: MET    LONG TERM GOALS: Target date: 11/25/2022>UPDATED TARGET 01/06/2023  Patient to be independent with advanced HEP. Baseline: Not yet initiated, 11/25/2022: initiated, but pt reports not doing regularly Goal status: IN PROGRESS  Patient to demonstrate B ankle DF AROM to at least 10 deg to improve safety with gait.  Baseline: 4 deg, 8 deg with compensations >11/25/2022 met 10 degrees bilaterally Goal status: MET8/12/2022  Patient to demonstrate alternating reciprocal pattern when ascending and descending stairs with good stability and 1 handrail as needed.   Baseline: NT Goal status: MET 11/25/2022  Patient to complete TUG in <14 sec with LRAD in order to decrease risk of falls.   Baseline: 21.38 sec>19.12 sec 11/25/2022 Goal status: IN PROGRESS  Patient to demonstrate 5xSTS test in <15 sec with good control and posture in order to decrease risk of falls.  Baseline: 13.92 sec with limited eccentric control and not standing fully> 16.25 sec with use of UE support today, not standing fully Goal status: IN PROGRESS  Patient to score at least 47/56 on Berg in order to decrease risk of falls.  Baseline: 42>40/56 11/25/2022 Goal status: IN PROGRESS  Patient to score at least 55 on FOTO in order to indicate improved functional outcomes.  Baseline: 47>48 11/25/2022 Goal status: IN PROGRESS   ASSESSMENT:  CLINICAL  IMPRESSION: Pt presents to OPPT today reporting he hasn't been feeling well in past 1-2 days, with low BP readings at home.  Vitals today are WFL, though pt reports overall lethargy and decreased motivation for activities.  Assessed LTGs, with pt meeting LTG 2 and 3.  Remaining LTGs for HEP and for balance measures have not yet been met.  Pt has only been seen 3 visits since evaluation (initial frequency was 1x/wk), and PT had discussion that more frequent PT sessions as well as consistent HEP performance may help patient progress better towards goals of better balance and improved functional mobility.  Also discussed pt following up with MD regarding lethargy, low BP measures at home, as well as pain and discomfort in buttocks area (from pt-reported skin wound from hospital) in order to be able to fully participate in therapy sessions.  He is in agreement and would benefit from increased PT frequency to address balance and gait for improved stability and independence with gait.  OBJECTIVE IMPAIRMENTS: Abnormal gait, decreased balance, decreased coordination, and impaired flexibility.   ACTIVITY LIMITATIONS: carrying, lifting, bending, standing, stairs, transfers, bathing, toileting, dressing, reach over head, hygiene/grooming, and locomotion level  PARTICIPATION LIMITATIONS: meal prep, cleaning, laundry, driving, shopping, community activity, yard work, and church  PERSONAL FACTORS: Age, Behavior pattern, Past/current experiences, Time since onset of injury/illness/exacerbation, and 3+ comorbidities: Anxiety, CAD, depression, DM, HTN, HLD, loop recorder, CABG 2008  are also affecting patient's functional outcome.   REHAB POTENTIAL: Good  CLINICAL  DECISION MAKING: Evolving/moderate complexity  EVALUATION COMPLEXITY: Moderate  PLAN:  PT FREQUENCY: 2x/week  PT DURATION: 6 weeks per recert 11/25/2022  PLANNED INTERVENTIONS: Therapeutic exercises, Therapeutic activity, Neuromuscular re-education,  Balance training, Gait training, Patient/Family education, Self Care, Joint mobilization, Stair training, Vestibular training, Canalith repositioning, DME instructions, Aquatic Therapy, Dry Needling, Electrical stimulation, Cryotherapy, Moist heat, Taping, Manual therapy, and Re-evaluation  PLAN FOR NEXT SESSION: Review and progress HEP; work on [employer], tandem stance and gait, balance activities   Lonia Blood, PT 11/25/22 12:06 PM Phone: [phone] Fax: [phone]  [facility] at [facility] [address], [address] [place], [place] [address] Phone # [phone] Fax # [phone]

## 2022-11-28 ENCOUNTER — Inpatient Hospital Stay: Payer: HMO | Admitting: Neurology

## 2022-11-29 ENCOUNTER — Emergency Department (HOSPITAL_BASED_OUTPATIENT_CLINIC_OR_DEPARTMENT_OTHER): Payer: HMO | Admitting: Radiology

## 2022-11-29 ENCOUNTER — Other Ambulatory Visit (HOSPITAL_BASED_OUTPATIENT_CLINIC_OR_DEPARTMENT_OTHER): Payer: Self-pay

## 2022-11-29 ENCOUNTER — Other Ambulatory Visit: Payer: Self-pay

## 2022-11-29 ENCOUNTER — Telehealth: Payer: Self-pay

## 2022-11-29 ENCOUNTER — Encounter (HOSPITAL_BASED_OUTPATIENT_CLINIC_OR_DEPARTMENT_OTHER): Payer: Self-pay | Admitting: Emergency Medicine

## 2022-11-29 ENCOUNTER — Emergency Department (HOSPITAL_BASED_OUTPATIENT_CLINIC_OR_DEPARTMENT_OTHER)
Admission: EM | Admit: 2022-11-29 | Discharge: 2022-11-29 | Disposition: A | Discharge status: Home / Self Care | Payer: HMO

## 2022-11-29 DIAGNOSIS — R0602 Shortness of breath: Secondary | ICD-10-CM | POA: Diagnosis not present

## 2022-11-29 DIAGNOSIS — I4891 Unspecified atrial fibrillation: Secondary | ICD-10-CM | POA: Diagnosis not present

## 2022-11-29 DIAGNOSIS — I5032 Chronic diastolic (congestive) heart failure: Secondary | ICD-10-CM

## 2022-11-29 DIAGNOSIS — R600 Localized edema: Secondary | ICD-10-CM | POA: Diagnosis not present

## 2022-11-29 DIAGNOSIS — Z7901 Long term (current) use of anticoagulants: Secondary | ICD-10-CM | POA: Diagnosis not present

## 2022-11-29 DIAGNOSIS — J9811 Atelectasis: Secondary | ICD-10-CM | POA: Diagnosis not present

## 2022-11-29 DIAGNOSIS — N1832 Chronic kidney disease, stage 3b: Secondary | ICD-10-CM

## 2022-11-29 DIAGNOSIS — I2510 Atherosclerotic heart disease of native coronary artery without angina pectoris: Secondary | ICD-10-CM | POA: Insufficient documentation

## 2022-11-29 DIAGNOSIS — E860 Dehydration: Secondary | ICD-10-CM

## 2022-11-29 DIAGNOSIS — R918 Other nonspecific abnormal finding of lung field: Secondary | ICD-10-CM | POA: Diagnosis not present

## 2022-11-29 DIAGNOSIS — J90 Pleural effusion, not elsewhere classified: Secondary | ICD-10-CM | POA: Diagnosis not present

## 2022-11-29 HISTORY — DX: Unspecified osteoarthritis, unspecified site: M19.90

## 2022-11-29 HISTORY — DX: Cerebral infarction, unspecified: I63.9

## 2022-11-29 LAB — BRAIN NATRIURETIC PEPTIDE: B Natriuretic Peptide: 233.6 pg/mL — ABNORMAL HIGH (ref 0.0–100.0)

## 2022-11-29 LAB — COMPREHENSIVE METABOLIC PANEL
ALT: 27 U/L (ref 0–44)
AST: 42 U/L — ABNORMAL HIGH (ref 15–41)
Albumin: 3.9 g/dL (ref 3.5–5.0)
Alkaline Phosphatase: 81 U/L (ref 38–126)
Anion gap: 9 (ref 5–15)
BUN: 27 mg/dL — ABNORMAL HIGH (ref 8–23)
CO2: 24 mmol/L (ref 22–32)
Calcium: 9.1 mg/dL (ref 8.9–10.3)
Chloride: 99 mmol/L (ref 98–111)
Creatinine, Ser: 1.56 mg/dL — ABNORMAL HIGH (ref 0.61–1.24)
GFR, Estimated: 43 mL/min — ABNORMAL LOW (ref >60)
Glucose, Bld: 215 mg/dL — ABNORMAL HIGH (ref 70–99)
Potassium: 4.8 mmol/L (ref 3.5–5.1)
Sodium: 132 mmol/L — ABNORMAL LOW (ref 135–145)
Total Bilirubin: 0.8 mg/dL (ref 0.3–1.2)
Total Protein: 7.2 g/dL (ref 6.5–8.1)

## 2022-11-29 LAB — CBC WITH DIFFERENTIAL/PLATELET
Abs Immature Granulocytes: 0.06 10*3/uL (ref 0.00–0.07)
Basophils Absolute: 0 10*3/uL (ref 0.0–0.1)
Basophils Relative: 0 %
Eosinophils Absolute: 0.1 10*3/uL (ref 0.0–0.5)
Eosinophils Relative: 1 %
HCT: 39.4 % (ref 39.0–52.0)
Hemoglobin: 13.4 g/dL (ref 13.0–17.0)
Immature Granulocytes: 1 %
Lymphocytes Relative: 16 %
Lymphs Abs: 1.7 10*3/uL (ref 0.7–4.0)
MCH: 32.4 pg (ref 26.0–34.0)
MCHC: 34 g/dL (ref 30.0–36.0)
MCV: 95.2 fL (ref 80.0–100.0)
Monocytes Absolute: 1 10*3/uL (ref 0.1–1.0)
Monocytes Relative: 10 %
Neutro Abs: 7.6 10*3/uL (ref 1.7–7.7)
Neutrophils Relative %: 72 %
Platelets: 157 10*3/uL (ref 150–400)
RBC: 4.14 MIL/uL — ABNORMAL LOW (ref 4.22–5.81)
RDW: 13.8 % (ref 11.5–15.5)
WBC: 10.5 10*3/uL (ref 4.0–10.5)
nRBC: 0 % (ref 0.0–0.2)

## 2022-11-29 LAB — TROPONIN I (HIGH SENSITIVITY): Troponin I (High Sensitivity): 17 ng/L (ref <18)

## 2022-11-29 LAB — MAGNESIUM: Magnesium: 1.9 mg/dL (ref 1.7–2.4)

## 2022-11-29 MED ORDER — SODIUM CHLORIDE 0.9 % IV BOLUS
500.0000 mL | Freq: Once | INTRAVENOUS | Status: AC
  Administered 2022-11-29: 500 mL via INTRAVENOUS

## 2022-11-29 NOTE — ED Triage Notes (Signed)
Pt has long standing history of dizziness that is related usually to dehydration. When he feels like that, he sometimes, falls and passes out. He had a stroke 2 months ago and "has been struggling since". He has been having dizziness since Sunday.

## 2022-11-29 NOTE — Discharge Instructions (Signed)
You were seen in the emergency department for your lightheadedness, generalized weakness.  Does not appear to have any life-threatening or emergent injuries or illnesses that require acute intervention or hospitalization.  Your heart rate is slightly low, please discuss medications with your cardiologist.  Return immediately if you again began feeling lightheaded, passout, chest pain, shortness of breath or any new or worsening symptoms.  Please try to maintain adequate nutrition as we discussed.  Please follow-up with your primary doctor regarding as soon as possible.  You may also return to develop any new or worsening symptoms that are concerning to you.

## 2022-11-29 NOTE — ED Provider Notes (Signed)
[place] EMERGENCY DEPARTMENT AT [facility] Provider Note   CSN: [id number] Arrival date & time: 11/29/22  5956     History  No chief complaint on file.   [name] is a 88 y.o. male.  This is an 88 year old male history of A-fib, CAD, prostate issues presenting emergency department with lightheadedness.  Reports decreased p.o. intake due to lack of appetite over this past several days.  [name] has been lightheaded since Sunday.  No chest pain, but is endorsing some shortness of breath.  Some lower extremity edema.  States that typically when this happens [name] is dehydrated.        Home Medications Prior to Admission medications   Medication Sig Start Date End Date Taking? Authorizing Provider  apixaban (ELIQUIS) 5 MG TABS tablet Take 5 mg by mouth 2 (two) times daily. 11/21/22  Yes [provider]  escitalopram (LEXAPRO) 10 MG tablet Take 10 mg by mouth daily. 11/21/22  Yes [provider]  ezetimibe (ZETIA) 10 MG tablet Take 10 mg by mouth daily. 11/21/22  Yes [provider]  mupirocin ointment (BACTROBAN) 2 % Apply 1 Application topically 2 (two) times daily. 11/21/22  Yes [provider]  [facility] VERIO test strip 1 each by Other route daily. 11/15/22  Yes [provider]  TRESIBA FLEXTOUCH 200 UNIT/ML FlexTouch Pen Inject into the skin. 11/24/22  Yes [provider]  acebutolol (SECTRAL) 200 MG capsule Take 1 capsule (200 mg total) by mouth 2 (two) times daily. 10/12/22   Angiulli, Mcarthur Rossetti, PA-C  acetaminophen (TYLENOL) 325 MG tablet Take 2 tablets (650 mg total) by mouth every 4 (four) hours as needed for mild pain (or temp > 37.5 C (99.5 F)). 10/12/22   Angiulli, Mcarthur Rossetti, PA-C  atorvastatin (LIPITOR) 80 MG tablet Take 1 tablet (80 mg total) by mouth daily. 10/12/22   Angiulli, Mcarthur Rossetti, PA-C  empagliflozin (JARDIANCE) 25 MG TABS tablet Take 1 tablet (25 mg total) by mouth daily. 10/12/22   Angiulli, Mcarthur Rossetti, PA-C   furosemide (LASIX) 20 MG tablet Take 20 mg by mouth daily.    [provider]  METFORMIN HCL PO     [provider]  mirabegron ER (MYRBETRIQ) 50 MG TB24 tablet Take 1 tablet (50 mg total) by mouth daily. 10/12/22   Angiulli, Mcarthur Rossetti, PA-C  olmesartan (BENICAR) 20 MG tablet Take 1 tablet (20 mg total) by mouth daily. 10/12/22   Angiulli, Mcarthur Rossetti, PA-C  tadalafil (CIALIS) 5 MG tablet Take 1 tablet (5 mg total) by mouth daily. 10/12/22   Angiulli, Mcarthur Rossetti, PA-C  tamsulosin (FLOMAX) 0.4 MG CAPS capsule Take 1 capsule (0.4 mg total) by mouth daily after supper. 10/12/22   Angiulli, Mcarthur Rossetti, PA-C  verapamil (CALAN-SR) 120 MG CR tablet Take 1 tablet (120 mg total) by mouth at bedtime. 11/17/22   Yates Decamp, MD      Allergies    Ketorolac tromethamine    Review of Systems   Review of Systems  Physical Exam Updated Vital Signs BP 122/72   Pulse (!) 50   Temp 97.6 F (36.4 C) (Oral)   Resp (!) 21   SpO2 95%  Physical Exam Vitals and nursing note reviewed.  Constitutional:      General: [name] is not in acute distress.    Appearance: [name] is not toxic-appearing.  HENT:     Head: Normocephalic.     Mouth/Throat:     Mouth: Mucous membranes are moist.  Eyes:     Conjunctiva/sclera: Conjunctivae normal.  Cardiovascular:     Rate and Rhythm: Bradycardia present. Rhythm irregular.     Pulses: Normal pulses.  Pulmonary:     Effort: Pulmonary effort is normal.     Breath sounds: Normal breath sounds. No wheezing, rhonchi or rales.  Abdominal:     General: Abdomen is flat. There is no distension.     Tenderness: There is no abdominal tenderness. There is no guarding or rebound.  Musculoskeletal:     Right lower leg: Edema present.     Left lower leg: Edema present.  Skin:    General: Skin is warm.     Capillary Refill: Capillary refill takes less than 2 seconds.  Neurological:     Mental Status: [name] is alert and oriented to person, place, and time.  Psychiatric:         Mood and Affect: Mood normal.        Behavior: Behavior normal.     ED Results / Procedures / Treatments   Labs (all labs ordered are listed, but only abnormal results are displayed) Labs Reviewed  CBC WITH DIFFERENTIAL/PLATELET - Abnormal; Notable for the following components:      Result Value   RBC 4.14 (*)    All other components within normal limits  COMPREHENSIVE METABOLIC PANEL - Abnormal; Notable for the following components:   Sodium 132 (*)    Glucose, Bld 215 (*)    BUN 27 (*)    Creatinine, Ser 1.56 (*)    AST 42 (*)    GFR, Estimated 43 (*)    All other components within normal limits  BRAIN NATRIURETIC PEPTIDE - Abnormal; Notable for the following components:   B Natriuretic Peptide 233.6 (*)    All other components within normal limits  MAGNESIUM  TROPONIN I (HIGH SENSITIVITY)  TROPONIN I (HIGH SENSITIVITY)    EKG EKG Interpretation Date/Time:  Tuesday November 29 2022 09:41:44 EDT Ventricular Rate:  49 PR Interval:  80 QRS Duration:  145 QT Interval:  517 QTC Calculation: 443 R Axis:   68  Text Interpretation: Atrial fibrillation Multiform ventricular premature complexes Short PR interval Nonspecific intraventricular conduction delay Inferior infarct, age indeterminate Lateral leads are also involved Confirmed by Estanislado Pandy [phone]) on 11/29/2022 9:53:58 AM  Radiology DG Chest 2 View  Result Date: 11/29/2022 CLINICAL DATA:  Shortness of breath EXAM: CHEST - 2 VIEW COMPARISON:  X-ray 10/11/2022 FINDINGS: Status post median sternotomy. Stable cardiopericardial silhouette with calcified. Loop recorder overlies the lower left thorax improved left basilar atelectasis. There is some additional retrocardiac opacity in the left. Tiny right effusion. No pneumothorax or edema. Overlapping cardiac leads. Degenerative changes of the spine IMPRESSION: Improving left basilar atelectasis with some separate mild left retrocardiac opacity. Recommend continued follow-up. New  tiny right effusion. Postop chest with loop recorder Electronically Signed   By: Karen Kays M.D.   On: 11/29/2022 11:10    Procedures Procedures    Medications Ordered in ED Medications  sodium chloride 0.9 % bolus 500 mL (0 mLs Intravenous Stopped 11/29/22 1054)    ED Course/ Medical Decision Making/ A&P Clinical Course as of 11/29/22 1152  Tue Nov 29, 2022  6045 Per chart review ECHO 10/02/22: IMPRESSIONS     1. Left ventricular ejection fraction, by estimation, is 60 to 65%. The  left ventricle has normal function. The left ventricle has no regional  wall motion abnormalities. Left ventricular diastolic parameters are  indeterminate   [  TY]  [id number] Cardiology note 11/17/2022: "In view of elevated blood pressure, A-fib with RVR, will add verapamil 120 mg daily." [TY]  1117 DG Chest 2 View New tiny right effusion.  Postop chest with loop recorder   [TY]  1117 Troponin I (High Sensitivity): 17 Acs less likely [TY]  1117 B Natriuretic Peptide(!): 233.6 Mild elevation; slight LE edema. Low suspicion for acute decompensated heart failure.  [TY]  1118 Comprehensive metabolic panel(!) Mild hyponatremia.  Elevated BUN/creatinine.  Appears to be similar to prior. [TY]  1133 Patient reports improvement after IV fluids of his generalized weakness/lightheadedness.  Heart rate remains in the 50s.  Blood pressure in the 120s over 70s.  Will do orthostatic vitals and reassess. [TY]  1144 Orthostatics negative. [TY]  1149 Patient marked today largely reassuring.  EKG appears to be rate controlled A-fib/flutter heart rate in the 50s to 60s on the monitor.  Blood pressures improved with IV fluids.  [name] is orthostatic negative and asymptomatic.  [name] is feeling improved.  Labs as noted in ED course.  Discussed with patient close follow-up with his PCP and cardiology.  Son states that they will call cardiology when they leave here, they note they neverstarted verapamil, but have been taking acebutalol.   Also counseled on maintaining hydration/nutrition with frequent small sips of Ensure and liquids.  Patient also states that [name] has little energy/ambition.  No SI or HI.  Question possible depression as an underlying etiology.  Counseled on patient following up with PCP.  Patient felt to be stable for discharge.  Strict return precautions  given. [TY]    Clinical Course User Index [TY] Coral Spikes, DO                                 Medical Decision Making This is a 88 year old male presenting emergency department for lightheadedness.  [name] is afebrile, [name] is bradycardic with heart rate in the 40s to 50s, appears to be in A-fib/flutter.  Blood pressure reassuring 110/52.  Does not appear to be in any significant distress although complaining of some lightheadedness.  It appears per chart review that [name] is on verapamil which was recently increased by cardiology earlier this month.  Will get screening labs and give some IV fluids.  See ED course for further MDM and disposition.  Amount and/or Complexity of Data Reviewed Independent Historian:     Details: Patient's son reports that father typically has similar symptoms when [name] is dehydrated and improved after IV fluids. External Data Reviewed:     Details: See ED course. Labs: ordered. Decision-making details documented in ED Course. Radiology: ordered. Decision-making details documented in ED Course. ECG/medicine tests:     Details: Appears similar to prior.  Atrial fibrillation.  No ST segment changes indicate ischemia.           Final Clinical Impression(s) / ED Diagnoses Final diagnoses:  None    Rx / DC Orders ED Discharge Orders     None         Coral Spikes, DO 11/29/22 1152

## 2022-11-29 NOTE — ED Triage Notes (Signed)
Pt has a loop recorder (afib ) and was adjusted last week.

## 2022-11-29 NOTE — Telephone Encounter (Signed)
Stop Acebutalol, she told me he was only taking Verapamil and Olmesartan was stopped. So without appropriate medication list hard to help

## 2022-11-29 NOTE — ED Notes (Signed)
Pt d/c home with son per EDP order. Discharge summary reviewed, pt verbalizes understanding. Off unit via WC- No s/s of acute distress noted at discharge.

## 2022-11-30 ENCOUNTER — Ambulatory Visit: Payer: HMO | Admitting: Physical Therapy

## 2022-12-01 ENCOUNTER — Other Ambulatory Visit: Payer: Self-pay | Admitting: Cardiology

## 2022-12-01 DIAGNOSIS — I5032 Chronic diastolic (congestive) heart failure: Secondary | ICD-10-CM

## 2022-12-01 MED ORDER — FUROSEMIDE 40 MG PO TABS
40.0000 mg | ORAL_TABLET | Freq: Every day | ORAL | 1 refills | Status: DC
Start: 2022-12-01 — End: 2022-12-28

## 2022-12-01 NOTE — Telephone Encounter (Signed)
Called and spoke to patient yesterday morning she voiced understanding

## 2022-12-01 NOTE — Telephone Encounter (Signed)
Patient also seen in the emergency room with low blood pressure, although right leg edema and elevated BNP, in view of borderline blood pressure he received IV fluids and discharged home.    ICD-10-CM   1. Chronic diastolic heart failure (HCC)  U04.54 furosemide (LASIX) 40 MG tablet    Brain natriuretic peptide    CBC    2. Stage 3b chronic kidney disease (HCC)  N18.32 Basic metabolic panel      Advised him to increase furosemide to 40 mg daily for the next 1 week, then as needed for leg edema and dyspnea or weight gain of greater than 3 pounds.  Patient also has orthostatic hypotension and in view of this, bradycardia noted on loop recorder monitoring, discontinue verapamil.  Medications Discontinued During This Encounter  Medication Reason   verapamil (CALAN-SR) 120 MG CR tablet Side effect (s)   furosemide (LASIX) 20 MG tablet Reorder    Meds ordered this encounter  Medications   furosemide (LASIX) 40 MG tablet    Sig: Take 1 tablet (40 mg total) by mouth daily.    Dispense:  90 tablet    Refill:  1    Orders Placed This Encounter  Procedures   Brain natriuretic peptide    Standing Status:   Future    Standing Expiration Date:   12/01/2023   CBC    Standing Status:   Future    Standing Expiration Date:   12/01/2023   Basic metabolic panel      ICD-10-CM   1. Chronic diastolic heart failure (HCC)  U98.11 furosemide (LASIX) 40 MG tablet    Brain natriuretic peptide    CBC    2. Stage 3b chronic kidney disease (HCC)  N18.32 Basic metabolic panel      D/W patient and daughter Labs to be done in 2 weeks  Yates Decamp, MD, [facility] 12/01/2022, 7:45 PM Office: [phone] Fax: [phone] Pager: [phone]

## 2022-12-02 ENCOUNTER — Ambulatory Visit: Payer: HMO | Admitting: Physical Therapy

## 2022-12-05 ENCOUNTER — Encounter: Payer: Self-pay | Admitting: Physical Therapy

## 2022-12-05 ENCOUNTER — Ambulatory Visit: Payer: HMO | Admitting: Physical Therapy

## 2022-12-05 DIAGNOSIS — R2689 Other abnormalities of gait and mobility: Secondary | ICD-10-CM

## 2022-12-05 DIAGNOSIS — R2681 Unsteadiness on feet: Secondary | ICD-10-CM | POA: Diagnosis not present

## 2022-12-05 NOTE — Therapy (Signed)
OUTPATIENT PHYSICAL THERAPY NEURO TREATMENT  Patient Name: [name] MRN: [id number] DOB:07/10/1934, 88 y.o., male Today's Date: 12/05/2022   PCP: Cleatis Polka., MD  REFERRING PROVIDER: Horton Chin, MD  END OF SESSION:  PT End of Session - 12/05/22 0931     Visit Number 5    Number of Visits 16    Date for PT Re-Evaluation 01/06/23    Authorization Type [employer]    Progress Note Due on Visit 10    PT Start Time 0931    PT Stop Time 1012    PT Time Calculation (min) 41 min    Activity Tolerance Patient tolerated treatment well    Behavior During Therapy [facility] for tasks assessed/performed                Past Medical History:  Diagnosis Date   Acute renal failure (HCC)    secondary to nephrotic syndrome post cath, resolved with steroids   Anxiety    Arthritis    CAD (coronary artery disease) 04/2006   LIMA to LAD, sequential SVG to first, second, and third OM's, sequential SVG to mid RCA and PDA    Depression    Diabetes mellitus without complication (HCC)    ED (erectile dysfunction)    HTN (hypertension)    Hx of adenomatous colonic polyps    Hyperlipidemia    Loop recorder: Biotronic Biomonitor II loop 07/28/2021 07/28/2021   Obesity    Stroke (HCC)    june   Past Surgical History:  Procedure Laterality Date   COLONOSCOPY W/ BIOPSIES AND POLYPECTOMY     CORONARY ARTERY BYPASS GRAFT  2008   VESSELS X6   IR CT HEAD LTD  10/02/2022   IR PERCUTANEOUS ART THROMBECTOMY/INFUSION INTRACRANIAL INC DIAG ANGIO  10/02/2022   IR US GUIDE VASC ACCESS RIGHT  10/02/2022   LOOP RECORDER IMPLANT     RADIOLOGY WITH ANESTHESIA N/A 10/02/2022   Procedure: IR WITH ANESTHESIA;  Surgeon: Radiologist, Medication, MD;  Location: [facility] OR;  Service: Radiology;  Laterality: N/A;   UMBILICAL HERNIA REPAIR     Patient Active Problem List   Diagnosis Date Noted   Ischemic cerebrovascular accident (CVA) (HCC) 10/06/2022   Status post surgery 10/02/2022   Stroke:  Multiple scattered bilateral ischemic infarcts s/p thrombectomy of left P1/PCA TICI 3 Etiology: Likely cardioembolic 10/02/2022   Hemianopsia 10/01/2022   Right homonymous hemianopsia 10/01/2022   Loop recorder: Biotronic Biomonitor II loop 07/28/2021 07/28/2021   Unsteady gait 08/05/2020   BPH with obstruction/lower urinary tract symptoms 01/21/2020   Anxiety disorder 02/02/2018   Right hip pain 10/02/2017   Recurrent major depression in remission (HCC) 01/27/2017   Chronic kidney disease, stage 3a (HCC) 07/17/2015   Personal history of colonic adenomas 03/26/2013   Obesity 10/04/2012   OBSTRUCTIVE SLEEP APNEA 10/30/2008   HYPERLIPIDEMIA-MIXED 07/01/2008   HYPERTENSION, BENIGN 07/01/2008   CAD, NATIVE VESSEL 07/01/2008   FATIGUE / MALAISE 07/01/2008   EDEMA 07/01/2008    ONSET DATE: 10/02/22  REFERRING DIAG: I63.9 (ICD-10-CM) - Cerebral infarction, unspecified  THERAPY DIAG:  Unsteadiness on feet  Other abnormalities of gait and mobility  Rationale for Evaluation and Treatment: Rehabilitation  SUBJECTIVE:  SUBJECTIVE STATEMENT: Feel better today.  Slept well last night.    Pt accompanied by: self  PERTINENT HISTORY: Anxiety, CAD, depression, DM, HTN, HLD, loop recorder, CABG 2008  PAIN:  Are you having pain? No  PRECAUTIONS: Fall, ICD/Pacemaker, and Other: loop recorder .  Pt reports 11/25/2022 visit that he has wound on buttocks from "when in the hospital"  RED FLAGS: None   WEIGHT BEARING RESTRICTIONS: No  FALLS: Has patient fallen in last 6 months? Yes. Number of falls 1 son reports syncopal episode caused the fall  LIVING ENVIRONMENT: Lives with: lives alone Lives in: House/apartment Stairs:  2 steps to enter without handrail; 2nd story home but stays on 1st floor Has  following equipment at home: Dan Humphreys - 2 wheeled and walking stick  PLOF: Independent; has cleaning services   PATIENT GOALS: improve balance   OBJECTIVE:    TODAY'S TREATMENT: 12/05/2022 Activity Comments  Vitals 137/81   Warm up: -forward/back walking x 2 min -sidestepping x 2 min -tandem gait x 1 min 1 UE support  Alt step taps to 6" step, 2 x 10 reps 1 UE support to no support (min guard)  Step taps to cones BUE support  Romberg stance EO, EC; 30 sec; Romberg with head turns/nods Intermittent UE support  Standing on incline/decline-forward/back step taps x 10 reps       Access Code: [email or web address] URL: [email or web address] Date: 12/05/2022 Prepared by: [facility] Outpatient  Rehab - [facility]  Exercises - Narrow Stance with Counter Support  - 1 x daily - 5 x weekly - 2 sets - 30 sec hold - Romberg Stance with Eyes Closed  - 1 x daily - 5 x weekly - 2 sets - 30 sec hold - Tandem Walking with Counter Support  - 1 x daily - 5 x weekly - 2 sets - 10 reps - Forward Step Touch  - 1 x daily - 7 x weekly - 2 sets - 10 reps - Single Leg Stance with Support  - 1 x daily - 7 x weekly - 1 sets - 3 reps - 10 sec hold    PATIENT EDUCATION: Education details: HEP additions Person educated: Patient Education method: Explanation, Demonstration, and Handouts Education comprehension: verbalized understanding, returned demonstration, and needs further education      -------------------------------------------------------------- Objective measures below taken at initial evaluation:  DIAGNOSTIC FINDINGS: 10/02/22 brain MRI: Small acute infarcts scattered in the bilateral cerebellum (left greater than right). Punctate acute lacunar infarcts in both the left pons and thalamus. And minimal occipital lobe restricted diffusion, perhaps greater on the right.   10/01/22 head/neck angio CT: Short segment occlusion of the proximal left P1 segment, with additional moderate  stenosis in the distal left P2 segment. Severe stenosis in the left supraclinoid ICA and moderate stenosis in the right supraclinoid ICA. 4. Severe stenosis at the origin of the right PICA. Mild stenosis in the distal basilar artery.   COGNITION: Overall cognitive status: Within functional limits for tasks assessed   SENSATION: Denies N/T in UEs/LEs   COORDINATION: Alternating pronation/supination: dysmetric L>R Alternating toe tap: dysmetric L>R Finger to nose: dysmetric L>R Heel to shin: intact B     MUSCLE TONE: WNL but B calf very tight    POSTURE: rounded shoulders  LOWER EXTREMITY ROM:     Active  Right Eval Left Eval  Hip flexion    Hip extension    Hip abduction    Hip adduction    Hip internal rotation  Hip external rotation    Knee flexion    Knee extension    Ankle dorsiflexion 4 *knee bent 8 knee bent  Ankle plantarflexion    Ankle inversion    Ankle eversion     (Blank rows = not tested)   LOWER EXTREMITY MMT:    MMT (in sitting) Right Eval Left Eval  Hip flexion 4 4-  Hip extension    Hip abduction 4 3+  Hip adduction 3+ 3+  Hip internal rotation    Hip external rotation    Knee flexion 4 4  Knee extension 5 5  Ankle dorsiflexion 4+ 4  Ankle plantarflexion 4+ 4+  Ankle inversion    Ankle eversion    (Blank rows = not tested)   GAIT: Gait pattern: Slow, wide BOS, unsteady  Assistive device utilized: None Level of assistance: SBA   FUNCTIONAL TESTS:       PATIENT SURVEYS:  FOTO 47, 55    GOALS: Goals reviewed with patient? Yes  SHORT TERM GOALS: Target date: 11/11/2022  Patient to be independent with initial HEP. Baseline: HEP initiated Goal status: MET    LONG TERM GOALS: Target date: 11/25/2022>UPDATED TARGET 01/06/2023  Patient to be independent with advanced HEP. Baseline: Not yet initiated, 11/25/2022: initiated, but pt reports not doing regularly Goal status: IN PROGRESS  Patient to demonstrate B ankle DF  AROM to at least 10 deg to improve safety with gait.  Baseline: 4 deg, 8 deg with compensations >11/25/2022 met 10 degrees bilaterally Goal status: MET8/12/2022  Patient to demonstrate alternating reciprocal pattern when ascending and descending stairs with good stability and 1 handrail as needed.   Baseline: NT Goal status: MET 11/25/2022  Patient to complete TUG in <14 sec with LRAD in order to decrease risk of falls.   Baseline: 21.38 sec>19.12 sec 11/25/2022 Goal status: IN PROGRESS  Patient to demonstrate 5xSTS test in <15 sec with good control and posture in order to decrease risk of falls.  Baseline: 13.92 sec with limited eccentric control and not standing fully> 16.25 sec with use of UE support today, not standing fully Goal status: IN PROGRESS  Patient to score at least 47/56 on Berg in order to decrease risk of falls.  Baseline: 42>40/56 11/25/2022 Goal status: IN PROGRESS  Patient to score at least 55 on FOTO in order to indicate improved functional outcomes.  Baseline: 47>48 11/25/2022 Goal status: IN PROGRESS   ASSESSMENT:  CLINICAL IMPRESSION: Skilled PT session today focused on balance exercises, especially SLS and vision removed.  He requires UE support for balance with increased unsteadiness with attempts at less UE support.  Modified/reviewed HEP and highlighted the importance of consistent HEP performance.  He will continue to benefit from skilled PT to further progress balance.  OBJECTIVE IMPAIRMENTS: Abnormal gait, decreased balance, decreased coordination, and impaired flexibility.   ACTIVITY LIMITATIONS: carrying, lifting, bending, standing, stairs, transfers, bathing, toileting, dressing, reach over head, hygiene/grooming, and locomotion level  PARTICIPATION LIMITATIONS: meal prep, cleaning, laundry, driving, shopping, community activity, yard work, and church  PERSONAL FACTORS: Age, Behavior pattern, Past/current experiences, Time since onset of  injury/illness/exacerbation, and 3+ comorbidities: Anxiety, CAD, depression, DM, HTN, HLD, loop recorder, CABG 2008  are also affecting patient's functional outcome.   REHAB POTENTIAL: Good  CLINICAL DECISION MAKING: Evolving/moderate complexity  EVALUATION COMPLEXITY: Moderate  PLAN:  PT FREQUENCY: 2x/week  PT DURATION: 6 weeks per recert 11/25/2022  PLANNED INTERVENTIONS: Therapeutic exercises, Therapeutic activity, Neuromuscular re-education, Balance training, Gait training, Patient/Family education, Self  Care, Joint mobilization, Stair training, Vestibular training, Canalith repositioning, DME instructions, Aquatic Therapy, Dry Needling, Electrical stimulation, Cryotherapy, Moist heat, Taping, Manual therapy, and Re-evaluation  PLAN FOR NEXT SESSION: Review and progress HEP; work on SLS, tandem stance and gait, balance activities on compliant surfaces.   Lonia Blood, PT 12/05/22 10:10 AM Phone: [phone] Fax: [phone]  [facility] at [facility] [address] [place], [address] [place], [place] [address] Phone # [phone] Fax # [phone]

## 2022-12-07 ENCOUNTER — Encounter: Payer: Self-pay | Admitting: Physical Therapy

## 2022-12-07 ENCOUNTER — Ambulatory Visit: Payer: HMO | Admitting: Physical Therapy

## 2022-12-07 DIAGNOSIS — R2681 Unsteadiness on feet: Secondary | ICD-10-CM

## 2022-12-07 DIAGNOSIS — N1832 Chronic kidney disease, stage 3b: Secondary | ICD-10-CM | POA: Diagnosis not present

## 2022-12-07 DIAGNOSIS — R2689 Other abnormalities of gait and mobility: Secondary | ICD-10-CM

## 2022-12-07 DIAGNOSIS — I5032 Chronic diastolic (congestive) heart failure: Secondary | ICD-10-CM | POA: Diagnosis not present

## 2022-12-07 NOTE — Therapy (Signed)
OUTPATIENT PHYSICAL THERAPY NEURO TREATMENT  Patient Name: [name] MRN: [id number] DOB:23-Apr-1934, 88 y.o., male Today's Date: 12/07/2022   PCP: Cleatis Polka., MD  REFERRING PROVIDER: Horton Chin, MD  END OF SESSION:  PT End of Session - 12/07/22 0933     Visit Number 6    Number of Visits 16    Date for PT Re-Evaluation 01/06/23    Authorization Type [employer]    Progress Note Due on Visit 10    PT Start Time 0932    PT Stop Time 1014    PT Time Calculation (min) 42 min    Equipment Utilized During Treatment Gait belt    Activity Tolerance Patient tolerated treatment well    Behavior During Therapy WFL for tasks assessed/performed                 Past Medical History:  Diagnosis Date   Acute renal failure (HCC)    secondary to nephrotic syndrome post cath, resolved with steroids   Anxiety    Arthritis    CAD (coronary artery disease) 04/2006   LIMA to LAD, sequential SVG to first, second, and third OM's, sequential SVG to mid RCA and PDA    Depression    Diabetes mellitus without complication (HCC)    ED (erectile dysfunction)    HTN (hypertension)    Hx of adenomatous colonic polyps    Hyperlipidemia    Loop recorder: Biotronic Biomonitor II loop 07/28/2021 07/28/2021   Obesity    Stroke (HCC)    june   Past Surgical History:  Procedure Laterality Date   COLONOSCOPY W/ BIOPSIES AND POLYPECTOMY     CORONARY ARTERY BYPASS GRAFT  2008   VESSELS X6   IR CT HEAD LTD  10/02/2022   IR PERCUTANEOUS ART THROMBECTOMY/INFUSION INTRACRANIAL INC DIAG ANGIO  10/02/2022   IR US GUIDE VASC ACCESS RIGHT  10/02/2022   LOOP RECORDER IMPLANT     RADIOLOGY WITH ANESTHESIA N/A 10/02/2022   Procedure: IR WITH ANESTHESIA;  Surgeon: Radiologist, Medication, MD;  Location: [facility] OR;  Service: Radiology;  Laterality: N/A;   UMBILICAL HERNIA REPAIR     Patient Active Problem List   Diagnosis Date Noted   Ischemic cerebrovascular accident (CVA) (HCC)  10/06/2022   Status post surgery 10/02/2022   Stroke: Multiple scattered bilateral ischemic infarcts s/p thrombectomy of left P1/PCA TICI 3 Etiology: Likely cardioembolic 10/02/2022   Hemianopsia 10/01/2022   Right homonymous hemianopsia 10/01/2022   Loop recorder: Biotronic Biomonitor II loop 07/28/2021 07/28/2021   Unsteady gait 08/05/2020   BPH with obstruction/lower urinary tract symptoms 01/21/2020   Anxiety disorder 02/02/2018   Right hip pain 10/02/2017   Recurrent major depression in remission (HCC) 01/27/2017   Chronic kidney disease, stage 3a (HCC) 07/17/2015   Personal history of colonic adenomas 03/26/2013   Obesity 10/04/2012   OBSTRUCTIVE SLEEP APNEA 10/30/2008   HYPERLIPIDEMIA-MIXED 07/01/2008   HYPERTENSION, BENIGN 07/01/2008   CAD, NATIVE VESSEL 07/01/2008   FATIGUE / MALAISE 07/01/2008   EDEMA 07/01/2008    ONSET DATE: 10/02/22  REFERRING DIAG: I63.9 (ICD-10-CM) - Cerebral infarction, unspecified  THERAPY DIAG:  Unsteadiness on feet  Other abnormalities of gait and mobility  Rationale for Evaluation and Treatment: Rehabilitation  SUBJECTIVE:  SUBJECTIVE STATEMENT: Feel better again today.  Did a few of the exercises on the sheets.  Pt accompanied by: self  PERTINENT HISTORY: Anxiety, CAD, depression, DM, HTN, HLD, loop recorder, CABG 2008  PAIN:  Are you having pain? No  PRECAUTIONS: Fall, ICD/Pacemaker, and Other: loop recorder .  Pt reports 11/25/2022 visit that he has wound on buttocks from "when in the hospital"  RED FLAGS: None   WEIGHT BEARING RESTRICTIONS: No  FALLS: Has patient fallen in last 6 months? Yes. Number of falls 1 son reports syncopal episode caused the fall  LIVING ENVIRONMENT: Lives with: lives alone Lives in: House/apartment Stairs:  2 steps  to enter without handrail; 2nd story home but stays on 1st floor Has following equipment at home: Dan Humphreys - 2 wheeled and walking stick  PLOF: Independent; has cleaning services   PATIENT GOALS: improve balance   OBJECTIVE:     TODAY'S TREATMENT: 12/07/2022 Activity Comments  Vitals 145/80   Warm up: -forward/back walking x 2 min -sidestepping x 2 min -tandem gait forward/back x 2 min>tandem march 1 UE support  SLS 3 x 10 sec 1 UE support to no support (min guard) and cues for stability to use support as needed  Standing on foam: -Marching in place 2 x 10 -Forward step taps 2 x 10 -Forward>back step taps 2 x 10   Standing on foam: -Feet apart and Romberg stance EO, EC; 30 sec -Then feet apart and Romberg with head turns/nods EO Intermittent UE support  Standing on incline feet apart with EO x 30 sec, EC x 30 sec Increased posterior lean, improves with cues for plantarflexion to come forwards for balance recovery  Suitcase carry, 3 reps x 20 ft 7# weight   Access Code: [email or web address] URL: [email or web address] Date: 12/05/2022 Prepared by: [facility] - Outpatient  Rehab - [facility]  Exercises - Narrow Stance with Counter Support  - 1 x daily - 5 x weekly - 2 sets - 30 sec hold - Romberg Stance with Eyes Closed  - 1 x daily - 5 x weekly - 2 sets - 30 sec hold - Tandem Walking with Counter Support  - 1 x daily - 5 x weekly - 2 sets - 10 reps - Forward Step Touch  - 1 x daily - 7 x weekly - 2 sets - 10 reps - Single Leg Stance with Support  - 1 x daily - 7 x weekly - 1 sets - 3 reps - 10 sec hold    PATIENT EDUCATION: Education details: continue current HEP Person educated: Patient Education method: Explanation, Demonstration, and Handouts Education comprehension: verbalized understanding, returned demonstration, and needs further education      -------------------------------------------------------------- Objective measures below taken at initial  evaluation:  DIAGNOSTIC FINDINGS: 10/02/22 brain MRI: Small acute infarcts scattered in the bilateral cerebellum (left greater than right). Punctate acute lacunar infarcts in both the left pons and thalamus. And minimal occipital lobe restricted diffusion, perhaps greater on the right.   10/01/22 head/neck angio CT: Short segment occlusion of the proximal left P1 segment, with additional moderate stenosis in the distal left P2 segment. Severe stenosis in the left supraclinoid ICA and moderate stenosis in the right supraclinoid ICA. 4. Severe stenosis at the origin of the right PICA. Mild stenosis in the distal basilar artery.   COGNITION: Overall cognitive status: Within functional limits for tasks assessed   SENSATION: Denies N/T in UEs/LEs   COORDINATION: Alternating pronation/supination: dysmetric L>R Alternating toe tap: dysmetric L>R Finger  to nose: dysmetric L>R Heel to shin: intact B     MUSCLE TONE: WNL but B calf very tight    POSTURE: rounded shoulders  LOWER EXTREMITY ROM:     Active  Right Eval Left Eval  Hip flexion    Hip extension    Hip abduction    Hip adduction    Hip internal rotation    Hip external rotation    Knee flexion    Knee extension    Ankle dorsiflexion 4 *knee bent 8 knee bent  Ankle plantarflexion    Ankle inversion    Ankle eversion     (Blank rows = not tested)   LOWER EXTREMITY MMT:    MMT (in sitting) Right Eval Left Eval  Hip flexion 4 4-  Hip extension    Hip abduction 4 3+  Hip adduction 3+ 3+  Hip internal rotation    Hip external rotation    Knee flexion 4 4  Knee extension 5 5  Ankle dorsiflexion 4+ 4  Ankle plantarflexion 4+ 4+  Ankle inversion    Ankle eversion    (Blank rows = not tested)   GAIT: Gait pattern: Slow, wide BOS, unsteady  Assistive device utilized: None Level of assistance: SBA   FUNCTIONAL TESTS:       PATIENT SURVEYS:  FOTO 47, 55    GOALS: Goals reviewed with  patient? Yes  SHORT TERM GOALS: Target date: 11/11/2022  Patient to be independent with initial HEP. Baseline: HEP initiated Goal status: MET    LONG TERM GOALS: Target date: 11/25/2022>UPDATED TARGET 01/06/2023  Patient to be independent with advanced HEP. Baseline: Not yet initiated, 11/25/2022: initiated, but pt reports not doing regularly Goal status: IN PROGRESS  Patient to demonstrate B ankle DF AROM to at least 10 deg to improve safety with gait.  Baseline: 4 deg, 8 deg with compensations >11/25/2022 met 10 degrees bilaterally Goal status: MET8/12/2022  Patient to demonstrate alternating reciprocal pattern when ascending and descending stairs with good stability and 1 handrail as needed.   Baseline: NT Goal status: MET 11/25/2022  Patient to complete TUG in <14 sec with LRAD in order to decrease risk of falls.   Baseline: 21.38 sec>19.12 sec 11/25/2022 Goal status: IN PROGRESS  Patient to demonstrate 5xSTS test in <15 sec with good control and posture in order to decrease risk of falls.  Baseline: 13.92 sec with limited eccentric control and not standing fully> 16.25 sec with use of UE support today, not standing fully Goal status: IN PROGRESS  Patient to score at least 47/56 on Berg in order to decrease risk of falls.  Baseline: 42>40/56 11/25/2022 Goal status: IN PROGRESS  Patient to score at least 55 on FOTO in order to indicate improved functional outcomes.  Baseline: 47>48 11/25/2022 Goal status: IN PROGRESS   ASSESSMENT:  CLINICAL IMPRESSION: Continued to work today in skilled PT session for balance exercises, including SLS, compliant surface, and vision removed. Progressed to dynamic gait with weighted carry, with min guard and mild unsteadiness.  He continues to require UE support for balance with increased unsteadiness with attempts at less UE support.  He will continue to benefit from skilled PT to further progress balance.  OBJECTIVE IMPAIRMENTS: Abnormal gait,  decreased balance, decreased coordination, and impaired flexibility.   ACTIVITY LIMITATIONS: carrying, lifting, bending, standing, stairs, transfers, bathing, toileting, dressing, reach over head, hygiene/grooming, and locomotion level  PARTICIPATION LIMITATIONS: meal prep, cleaning, laundry, driving, shopping, community activity, yard work, and church  PERSONAL FACTORS: Age, Behavior pattern, Past/current experiences, Time since onset of injury/illness/exacerbation, and 3+ comorbidities: Anxiety, CAD, depression, DM, HTN, HLD, loop recorder, CABG 2008  are also affecting patient's functional outcome.   REHAB POTENTIAL: Good  CLINICAL DECISION MAKING: Evolving/moderate complexity  EVALUATION COMPLEXITY: Moderate  PLAN:  PT FREQUENCY: 2x/week  PT DURATION: 6 weeks per recert 11/25/2022  PLANNED INTERVENTIONS: Therapeutic exercises, Therapeutic activity, Neuromuscular re-education, Balance training, Gait training, Patient/Family education, Self Care, Joint mobilization, Stair training, Vestibular training, Canalith repositioning, DME instructions, Aquatic Therapy, Dry Needling, Electrical stimulation, Cryotherapy, Moist heat, Taping, Manual therapy, and Re-evaluation  PLAN FOR NEXT SESSION: Review and progress HEP; work on SLS, tandem stance and gait, balance activities on compliant surfaces.   Lonia Blood, PT 12/07/22 10:16 AM Phone: [phone] Fax: [phone]  [facility] at [facility] [address] [place], [address] [place], [place] [address] Phone # [phone] Fax # [phone]

## 2022-12-08 LAB — CBC
Hematocrit: 42.5 % (ref 37.5–51.0)
Hemoglobin: 13.5 g/dL (ref 13.0–17.7)
MCH: 30.3 pg (ref 26.6–33.0)
MCHC: 31.8 g/dL (ref 31.5–35.7)
MCV: 96 fL (ref 79–97)
Platelets: 298 10*3/uL (ref 150–450)
RBC: 4.45 x10E6/uL (ref 4.14–5.80)
RDW: 12.4 % (ref 11.6–15.4)
WBC: 10.2 10*3/uL (ref 3.4–10.8)

## 2022-12-08 LAB — BASIC METABOLIC PANEL
BUN/Creatinine Ratio: 27 — ABNORMAL HIGH (ref 10–24)
BUN: 42 mg/dL — ABNORMAL HIGH (ref 8–27)
CO2: 26 mmol/L (ref 20–29)
Calcium: 9.7 mg/dL (ref 8.6–10.2)
Chloride: 98 mmol/L (ref 96–106)
Creatinine, Ser: 1.54 mg/dL — ABNORMAL HIGH (ref 0.76–1.27)
Glucose: 282 mg/dL — ABNORMAL HIGH (ref 70–99)
Potassium: 4.9 mmol/L (ref 3.5–5.2)
Sodium: 139 mmol/L (ref 134–144)
eGFR: 44 mL/min/{1.73_m2} — ABNORMAL LOW (ref >59)

## 2022-12-08 LAB — BRAIN NATRIURETIC PEPTIDE: BNP: 57.2 pg/mL (ref 0.0–100.0)

## 2022-12-12 ENCOUNTER — Ambulatory Visit: Payer: HMO

## 2022-12-12 ENCOUNTER — Encounter: Payer: HMO | Attending: Registered Nurse | Admitting: Physical Medicine and Rehabilitation

## 2022-12-12 VITALS — BP 151/72 | HR 55 | Ht 69.0 in | Wt 174.0 lb

## 2022-12-12 DIAGNOSIS — R2689 Other abnormalities of gait and mobility: Secondary | ICD-10-CM

## 2022-12-12 DIAGNOSIS — I639 Cerebral infarction, unspecified: Secondary | ICD-10-CM

## 2022-12-12 DIAGNOSIS — R2681 Unsteadiness on feet: Secondary | ICD-10-CM

## 2022-12-12 DIAGNOSIS — R001 Bradycardia, unspecified: Secondary | ICD-10-CM | POA: Insufficient documentation

## 2022-12-12 DIAGNOSIS — R5383 Other fatigue: Secondary | ICD-10-CM | POA: Insufficient documentation

## 2022-12-12 NOTE — Patient Instructions (Addendum)
LMNT electrolyte drink  Sweetener: allulose, monk fruit, stevia, honey  tea

## 2022-12-12 NOTE — Progress Notes (Signed)
Subjective:    Patient ID: [name], male    DOB: [birth year], 88 y.o.   MRN: [id number]  HPI:  1) Fatigue: new and worsened since the hospitalization -walking is difficult due to fatigue -says he does not eat very well -did a blood test with Dr. Jacinto Halim -sleeps well  [name] is a 88 y.o. male who returns for  HFU appointmen for follow up of his Ischemic cerebrovascular accident, Essential Hypertension and Hyperlipidemia. He presented to [facility] ED  on 10/01/2022 with diplopia and difficulty with balance.   H&P: Dr Julieanne Manson:  This is a new problem. The current episode started less than 1 hour ago. The problem occurs constantly. The problem has not changed since onset.Pertinent negatives include no chest pain, no abdominal pain, no headaches and no shortness of breath. Nothing aggravates the symptoms. Nothing relieves the symptoms. He has tried nothing for the symptoms. The treatment provided no relief.   [name] is a 88 y.o. male with a past medical history significant for CAD, hypertension, hyperlipidemia, diabetes, and sleep apnea who presents for neurologic complaint.  According to patient and family, he was last normal approximately 1 hour prior to arrival at 7:45 PM when he noticed sudden onset of double vision and difficulty with ambulation due to unsteadiness.  He has never had this before.  No history of strokes reported.  No history of TIAs to his knowledge.  He was at his baseline without any symptoms and drove somewhere before this onset.  Denies any headache or neck pain.  Denies trauma.  Denies palpitations, chest pain, shortness of breath.  No history of A-fib to his knowledge.  No vomiting but had some mild nausea with that initially.  Denies any speech difficulties or any numbness tingling or weakness of extremities.   I was called to see patient in triage and on my evaluation, patient does have disconjugate gaze.  His right eye will not look laterally.  He  does not have a facial droop and has intact sensation throughout.  Intact grip strength bilaterally and intact strength in the legs.  Finger-nose-finger was difficult due to the vision changes but appeared intact.  Patient did have right-sided hemianopia in both eyes that he does not know when it began.   Patient was activated as a code stroke and quickly taken to CT scanner.  CT initially did not show evidence of acute bleed or acute stroke.  Neurology felt that although the onset is quite clear when the diplopia began, the hemianopia is uncertain.  Thus, patient is not a candidate for TNK at this time.   They recommended MRI and admission to medicine to [facility] for further stroke workup but get CTA and perfusion if possible initially.   CTA was completed and patient does have a P1 occlusion acutely.  There is penumbra and no core infarct at this time.   I coordinated and spoke with Dr. Stephannie Peters with teleneurology and Dr. Amada Jupiter with [facility] neurology team and they spoke with the interventional neuroradiologist who feels patient is a candidate for thrombectomy.  CT Head: WO Contrast:  IMPRESSION: 1. No acute intracranial process. 2. ASPECTS is 10.  CTA:  IMPRESSION: 1. Short segment occlusion of the proximal left P1 segment, with additional moderate stenosis in the distal left P2 segment. 2. No infarct core identified. Possible area of decreased perfusion in the superior left cerebellum or left occipital lobe. This could reflect decreased perfusion related to short  segment left P1 occlusion. Superior cerebellar decreased perfusion 3. Severe stenosis in the left supraclinoid ICA and moderate stenosis in the right supraclinoid ICA. 4. Severe stenosis at the origin of the right PICA. Mild stenosis in the distal basilar artery. 5. No hemodynamically significant stenosis in the neck. 6. Aortic atherosclerosis.  MR Brain: WO Contrast:  IMPRESSION: 1. Small acute infarcts  scattered in the bilateral cerebellum (left greater than right). Punctate acute lacunar infarcts in both the left pons and thalamus. And minimal occipital lobe restricted diffusion, perhaps greater on the right. 2. No hemorrhagic transformation or intracranial mass effect. 3. Underlying chronic small vessel disease, including chronic thalamic microhemorrhages, and nonspecific ventricular enlargement.   He was transferred to [facility]. He underwent thrombectomy.   [name] was admitted to inpatient rehabilitation on 10/06/2022 and discharged home on 10/12/2022. He is receiving Outpatient Therapy at [facility]. He denies any pain. He rates his pain 0. Also reports his appetite is fair.   Daughter in room, all questions answered.   [name] was driving, discharge instructions stated no driving. This was discussed with DrRaulkar. Dr. Carlis Abbott spoke with patient and daughter, they discussed him driving. Dr Carlis Abbott gave Mr. Fergus instructions for driving.   Pain Inventory Average Pain 0 Pain Right Now 0 My pain is  No pain. Only problem with eye sight  and balance  LOCATION OF PAIN  No pain RT great toe  BOWEL Number of stools per week: 1-2 Oral laxative use  stool softener   BLADDER Normal and Pads Taking medication for bladder control.   Mobility use a cane ability to climb steps?  yes do you drive?  yes Do you have any goals in this area?  yes  Function retired I need assistance with the following:  meal prep and household duties Do you have any goals in this area?  yes  Neuro/Psych trouble walking  Prior Studies Any changes since last visit?  no  Physicians involved in your care Any changes since last visit?  no   Family History  Problem Relation Age of Onset   Heart attack Mother    Clotting disorder Father        died of renal disease   Heart disease Sister    Cancer Sister    Colon cancer Neg Hx    Social History   Socioeconomic History    Marital status: Widowed    Spouse name: Not on file   Number of children: Not on file   Years of education: Not on file   Highest education level: Not on file  Occupational History   Occupation: Field seismologist  Tobacco Use   Smoking status: Never   Smokeless tobacco: Never  Vaping Use   Vaping status: Never Used  Substance and Sexual Activity   Alcohol use: No    Alcohol/week: 1.0 standard drink of alcohol    Types: 1 Glasses of wine per week   Drug use: No   Sexual activity: Not on file  Other Topics Concern   Not on file  Social History Narrative   Retired Surveyor, minerals.  Widowed since 2014.  He has multiple children.  Never smoker 5 caffeinated beverages daily and no alcohol   Social Determinants of Health   Financial Resource Strain: Not on file  Food Insecurity: Not on file  Transportation Needs: Not on file  Physical Activity: Not on file  Stress: Not on file  Social Connections: Not on file   Past Surgical History:  Procedure Laterality Date   COLONOSCOPY W/ BIOPSIES AND POLYPECTOMY     CORONARY ARTERY BYPASS GRAFT  2008   VESSELS X6   IR CT HEAD LTD  10/02/2022   IR PERCUTANEOUS ART THROMBECTOMY/INFUSION INTRACRANIAL INC DIAG ANGIO  10/02/2022   IR US GUIDE VASC ACCESS RIGHT  10/02/2022   LOOP RECORDER IMPLANT     RADIOLOGY WITH ANESTHESIA N/A 10/02/2022   Procedure: IR WITH ANESTHESIA;  Surgeon: Radiologist, Medication, MD;  Location: [facility] OR;  Service: Radiology;  Laterality: N/A;   UMBILICAL HERNIA REPAIR     Past Medical History:  Diagnosis Date   Acute renal failure (HCC)    secondary to nephrotic syndrome post cath, resolved with steroids   Anxiety    Arthritis    CAD (coronary artery disease) 04/2006   LIMA to LAD, sequential SVG to first, second, and third OM's, sequential SVG to mid RCA and PDA    Depression    Diabetes mellitus without complication (HCC)    ED (erectile dysfunction)    HTN (hypertension)    Hx of adenomatous colonic polyps     Hyperlipidemia    Loop recorder: Biotronic Biomonitor II loop 07/28/2021 07/28/2021   Obesity    Stroke (HCC)    june   BP (!) 151/72   Pulse (!) 55   Ht 5\' 9"  (1.753 m)   Wt 174 lb (78.9 kg)   SpO2 96%   BMI 25.70 kg/m   Opioid Risk Score:   Fall Risk Score:  `1  Depression screen [facility] 2/9     12/12/2022   10:59 AM 11/07/2022   10:51 AM  Depression screen PHQ 2/9  Decreased Interest 0 1  Down, Depressed, Hopeless 0 1  PHQ - 2 Score 0 2  Altered sleeping  0  Tired, decreased energy  1  Change in appetite  1  Feeling bad or failure about yourself   1  Trouble concentrating  1  Moving slowly or fidgety/restless  1  Suicidal thoughts  0  PHQ-9 Score  7    Review of Systems  Eyes:  Positive for visual disturbance.  Musculoskeletal:        Balance  All other systems reviewed and are negative.      Objective:   Physical Exam Vitals and nursing note reviewed.  Constitutional:      Appearance: Normal appearance.  Cardiovascular:     Rate and Rhythm: Normal rate and regular rhythm.     Pulses: Normal pulses.     Heart sounds: Normal heart sounds.  Pulmonary:     Effort: Pulmonary effort is normal.     Breath sounds: Normal breath sounds.  Musculoskeletal:     Cervical back: Normal range of motion and neck supple.     Right lower leg: Edema present.     Left lower leg: Edema present.     Comments: Normal Muscle Bulk and Muscle Testing Reveals:  Upper Extremities: Full ROM and Muscle Strength 5/5 Lower Extremities Full ROM and Muscle Strength 5/5 Arises from Table with ease Narrow Based Gait     Skin:    General: Skin is warm and dry.  Neurological:     Mental Status: He is alert and oriented to person, place, and time.  Psychiatric:        Mood and Affect: Mood normal.        Behavior: Behavior normal.         Assessment & Plan:  1.Ischemic cerebrovascular accident: Continue  Outpatient Therapy at [facility]. He has a scheduled appointment with  Neurology. Continue current medication regimen. Continue to Monitor.   -discussed return to driving and that he is doing well.   -continue PT and OT  2. Essential Hypertension: Cardiology following. Continue current medication regimen. Continue to Monitor.   3.  Hyperlipidemia: Continue current medication regimen: PCP/Cardiology following. Continue to Monitor.   4. Daytime fatigue:  -B12, vitamin D, iron ordered -testosterone level ordered  5. Bradycardia: -discussed that acebutolol ordered -f/u with cardiology to see if acebutolol can be decreased further  6. Type 2 diabetes: -discussed that goal blood sugar 70-90.

## 2022-12-12 NOTE — Therapy (Signed)
OUTPATIENT PHYSICAL THERAPY NEURO TREATMENT  Patient Name: [name] MRN: [id number] DOB:12/21/1934, 88 y.o., male Today's Date: 12/12/2022   PCP: Cleatis Polka., MD  REFERRING PROVIDER: Horton Chin, MD  END OF SESSION:  PT End of Session - 12/12/22 0936     Visit Number 7    Number of Visits 16    Date for PT Re-Evaluation 01/06/23    Authorization Type [employer]    Progress Note Due on Visit 10    PT Start Time 0930    PT Stop Time 1015    PT Time Calculation (min) 45 min    Equipment Utilized During Treatment Gait belt    Activity Tolerance Patient tolerated treatment well    Behavior During Therapy WFL for tasks assessed/performed                 Past Medical History:  Diagnosis Date   Acute renal failure (HCC)    secondary to nephrotic syndrome post cath, resolved with steroids   Anxiety    Arthritis    CAD (coronary artery disease) 04/2006   LIMA to LAD, sequential SVG to first, second, and third OM's, sequential SVG to mid RCA and PDA    Depression    Diabetes mellitus without complication (HCC)    ED (erectile dysfunction)    HTN (hypertension)    Hx of adenomatous colonic polyps    Hyperlipidemia    Loop recorder: Biotronic Biomonitor II loop 07/28/2021 07/28/2021   Obesity    Stroke (HCC)    june   Past Surgical History:  Procedure Laterality Date   COLONOSCOPY W/ BIOPSIES AND POLYPECTOMY     CORONARY ARTERY BYPASS GRAFT  2008   VESSELS X6   IR CT HEAD LTD  10/02/2022   IR PERCUTANEOUS ART THROMBECTOMY/INFUSION INTRACRANIAL INC DIAG ANGIO  10/02/2022   IR US GUIDE VASC ACCESS RIGHT  10/02/2022   LOOP RECORDER IMPLANT     RADIOLOGY WITH ANESTHESIA N/A 10/02/2022   Procedure: IR WITH ANESTHESIA;  Surgeon: Radiologist, Medication, MD;  Location: [facility] OR;  Service: Radiology;  Laterality: N/A;   UMBILICAL HERNIA REPAIR     Patient Active Problem List   Diagnosis Date Noted   Ischemic cerebrovascular accident (CVA) (HCC)  10/06/2022   Status post surgery 10/02/2022   Stroke: Multiple scattered bilateral ischemic infarcts s/p thrombectomy of left P1/PCA TICI 3 Etiology: Likely cardioembolic 10/02/2022   Hemianopsia 10/01/2022   Right homonymous hemianopsia 10/01/2022   Loop recorder: Biotronic Biomonitor II loop 07/28/2021 07/28/2021   Unsteady gait 08/05/2020   BPH with obstruction/lower urinary tract symptoms 01/21/2020   Anxiety disorder 02/02/2018   Right hip pain 10/02/2017   Recurrent major depression in remission (HCC) 01/27/2017   Chronic kidney disease, stage 3a (HCC) 07/17/2015   Personal history of colonic adenomas 03/26/2013   Obesity 10/04/2012   OBSTRUCTIVE SLEEP APNEA 10/30/2008   HYPERLIPIDEMIA-MIXED 07/01/2008   HYPERTENSION, BENIGN 07/01/2008   CAD, NATIVE VESSEL 07/01/2008   FATIGUE / MALAISE 07/01/2008   EDEMA 07/01/2008    ONSET DATE: 10/02/22  REFERRING DIAG: I63.9 (ICD-10-CM) - Cerebral infarction, unspecified  THERAPY DIAG:  Unsteadiness on feet  Other abnormalities of gait and mobility  Ischemic cerebrovascular accident (CVA) (HCC)  Rationale for Evaluation and Treatment: Rehabilitation  SUBJECTIVE:  SUBJECTIVE STATEMENT: Right great toe ingrown toe nail is painful  Pt accompanied by: self  PERTINENT HISTORY: Anxiety, CAD, depression, DM, HTN, HLD, loop recorder, CABG 2008  PAIN:  Are you having pain? No  PRECAUTIONS: Fall, ICD/Pacemaker, and Other: loop recorder .  Pt reports 11/25/2022 visit that he has wound on buttocks from "when in the hospital"  RED FLAGS: None   WEIGHT BEARING RESTRICTIONS: No  FALLS: Has patient fallen in last 6 months? Yes. Number of falls 1 son reports syncopal episode caused the fall  LIVING ENVIRONMENT: Lives with: lives alone Lives in:  House/apartment Stairs:  2 steps to enter without handrail; 2nd story home but stays on 1st floor Has following equipment at home: Dan Humphreys - 2 wheeled and walking stick  PLOF: Independent; has cleaning services   PATIENT GOALS: improve balance   OBJECTIVE:   TODAY'S TREATMENT: 12/12/22 Activity Comments  Vitals: 142/77, 97 bpm   Sidestepping, retrowalking x 2 min   Semi-tandem 15 sec static, 2x head turns/nods  Standing balance -on foam feet apart/together: Eo/EC x 30 sec, head turns EO/EC 3x  Sit-stand with chest pass 1x10 Red med ball 26" seat height  Twist-toss med ball 1x10 Red med ball  LAQ 3x10 4#         TODAY'S TREATMENT: 12/07/2022 Activity Comments  Vitals 145/80   Warm up: -forward/back walking x 2 min -sidestepping x 2 min -tandem gait forward/back x 2 min>tandem march 1 UE support  SLS 3 x 10 sec 1 UE support to no support (min guard) and cues for stability to use support as needed  Standing on foam: -Marching in place 2 x 10 -Forward step taps 2 x 10 -Forward>back step taps 2 x 10   Standing on foam: -Feet apart and Romberg stance EO, EC; 30 sec -Then feet apart and Romberg with head turns/nods EO Intermittent UE support  Standing on incline feet apart with EO x 30 sec, EC x 30 sec Increased posterior lean, improves with cues for plantarflexion to come forwards for balance recovery  Suitcase carry, 3 reps x 20 ft 7# weight   Access Code: [email or web address] URL: [email or web address] Date: 12/05/2022 Prepared by: [facility] - Outpatient  Rehab - [facility]  Exercises - Narrow Stance with Counter Support  - 1 x daily - 5 x weekly - 2 sets - 30 sec hold - Romberg Stance with Eyes Closed  - 1 x daily - 5 x weekly - 2 sets - 30 sec hold - Tandem Walking with Counter Support  - 1 x daily - 5 x weekly - 2 sets - 10 reps - Forward Step Touch  - 1 x daily - 7 x weekly - 2 sets - 10 reps - Single Leg Stance with Support  - 1 x daily - 7 x weekly - 1 sets -  3 reps - 10 sec hold    PATIENT EDUCATION: Education details: continue current HEP Person educated: Patient Education method: Explanation, Demonstration, and Handouts Education comprehension: verbalized understanding, returned demonstration, and needs further education      -------------------------------------------------------------- Objective measures below taken at initial evaluation:  DIAGNOSTIC FINDINGS: 10/02/22 brain MRI: Small acute infarcts scattered in the bilateral cerebellum (left greater than right). Punctate acute lacunar infarcts in both the left pons and thalamus. And minimal occipital lobe restricted diffusion, perhaps greater on the right.   10/01/22 head/neck angio CT: Short segment occlusion of the proximal left P1 segment, with additional moderate stenosis in the distal left P2  segment. Severe stenosis in the left supraclinoid ICA and moderate stenosis in the right supraclinoid ICA. 4. Severe stenosis at the origin of the right PICA. Mild stenosis in the distal basilar artery.   COGNITION: Overall cognitive status: Within functional limits for tasks assessed   SENSATION: Denies N/T in UEs/LEs   COORDINATION: Alternating pronation/supination: dysmetric L>R Alternating toe tap: dysmetric L>R Finger to nose: dysmetric L>R Heel to shin: intact B     MUSCLE TONE: WNL but B calf very tight    POSTURE: rounded shoulders  LOWER EXTREMITY ROM:     Active  Right Eval Left Eval  Hip flexion    Hip extension    Hip abduction    Hip adduction    Hip internal rotation    Hip external rotation    Knee flexion    Knee extension    Ankle dorsiflexion 4 *knee bent 8 knee bent  Ankle plantarflexion    Ankle inversion    Ankle eversion     (Blank rows = not tested)   LOWER EXTREMITY MMT:    MMT (in sitting) Right Eval Left Eval  Hip flexion 4 4-  Hip extension    Hip abduction 4 3+  Hip adduction 3+ 3+  Hip internal rotation    Hip  external rotation    Knee flexion 4 4  Knee extension 5 5  Ankle dorsiflexion 4+ 4  Ankle plantarflexion 4+ 4+  Ankle inversion    Ankle eversion    (Blank rows = not tested)   GAIT: Gait pattern: Slow, wide BOS, unsteady  Assistive device utilized: None Level of assistance: SBA   FUNCTIONAL TESTS:       PATIENT SURVEYS:  FOTO 47, 55    GOALS: Goals reviewed with patient? Yes  SHORT TERM GOALS: Target date: 11/11/2022  Patient to be independent with initial HEP. Baseline: HEP initiated Goal status: MET    LONG TERM GOALS: Target date: 11/25/2022>UPDATED TARGET 01/06/2023  Patient to be independent with advanced HEP. Baseline: Not yet initiated, 11/25/2022: initiated, but pt reports not doing regularly Goal status: IN PROGRESS  Patient to demonstrate B ankle DF AROM to at least 10 deg to improve safety with gait.  Baseline: 4 deg, 8 deg with compensations >11/25/2022 met 10 degrees bilaterally Goal status: MET8/12/2022  Patient to demonstrate alternating reciprocal pattern when ascending and descending stairs with good stability and 1 handrail as needed.   Baseline: NT Goal status: MET 11/25/2022  Patient to complete TUG in <14 sec with LRAD in order to decrease risk of falls.   Baseline: 21.38 sec>19.12 sec 11/25/2022 Goal status: IN PROGRESS  Patient to demonstrate 5xSTS test in <15 sec with good control and posture in order to decrease risk of falls.  Baseline: 13.92 sec with limited eccentric control and not standing fully> 16.25 sec with use of UE support today, not standing fully Goal status: IN PROGRESS  Patient to score at least 47/56 on Berg in order to decrease risk of falls.  Baseline: 42>40/56 11/25/2022 Goal status: IN PROGRESS  Patient to score at least 55 on FOTO in order to indicate improved functional outcomes.  Baseline: 47>48 11/25/2022 Goal status: IN PROGRESS   ASSESSMENT:  CLINICAL IMPRESSION: Decreased postural stability on compliant  surfaces especially under eyes closed conditions with frequent LOB. Continued with activities to improve BLE strength/power requiring increased seat height for repetitive sit to stand activities. Dynamic standing balance to improve balance against postural perturbations. Continued sessions to meet POC  requirements  OBJECTIVE IMPAIRMENTS: Abnormal gait, decreased balance, decreased coordination, and impaired flexibility.   ACTIVITY LIMITATIONS: carrying, lifting, bending, standing, stairs, transfers, bathing, toileting, dressing, reach over head, hygiene/grooming, and locomotion level  PARTICIPATION LIMITATIONS: meal prep, cleaning, laundry, driving, shopping, community activity, yard work, and church  PERSONAL FACTORS: Age, Behavior pattern, Past/current experiences, Time since onset of injury/illness/exacerbation, and 3+ comorbidities: Anxiety, CAD, depression, DM, HTN, HLD, loop recorder, CABG 2008  are also affecting patient's functional outcome.   REHAB POTENTIAL: Good  CLINICAL DECISION MAKING: Evolving/moderate complexity  EVALUATION COMPLEXITY: Moderate  PLAN:  PT FREQUENCY: 2x/week  PT DURATION: 6 weeks per recert 11/25/2022  PLANNED INTERVENTIONS: Therapeutic exercises, Therapeutic activity, Neuromuscular re-education, Balance training, Gait training, Patient/Family education, Self Care, Joint mobilization, Stair training, Vestibular training, Canalith repositioning, DME instructions, Aquatic Therapy, Dry Needling, Electrical stimulation, Cryotherapy, Moist heat, Taping, Manual therapy, and Re-evaluation  PLAN FOR NEXT SESSION: Review and progress HEP; work on SLS, tandem stance and gait, balance activities on compliant surfaces.   11:36 AM, 12/12/22 M. Shary Decamp, PT, DPT Physical Therapist- [place] Office Number: [phone]

## 2022-12-13 ENCOUNTER — Encounter: Payer: HMO | Admitting: Physical Medicine and Rehabilitation

## 2022-12-13 DIAGNOSIS — R5383 Other fatigue: Secondary | ICD-10-CM | POA: Diagnosis not present

## 2022-12-13 LAB — VITAMIN D 25 HYDROXY (VIT D DEFICIENCY, FRACTURES): Vit D, 25-Hydroxy: 29.3 ng/mL — ABNORMAL LOW (ref 30.0–100.0)

## 2022-12-13 LAB — IRON,TIBC AND FERRITIN PANEL
Ferritin: 64 ng/mL (ref 30–400)
Iron Saturation: 23 % (ref 15–55)
Iron: 61 ug/dL (ref 38–169)
Total Iron Binding Capacity: 264 ug/dL (ref 250–450)
UIBC: 203 ug/dL (ref 111–343)

## 2022-12-13 LAB — VITAMIN B12: Vitamin B-12: 393 pg/mL (ref 232–1245)

## 2022-12-13 LAB — TESTOSTERONE: Testosterone: 272 ng/dL (ref 264–916)

## 2022-12-13 MED ORDER — VITAMIN D (ERGOCALCIFEROL) 1.25 MG (50000 UNIT) PO CAPS: 50000.0000 [IU] | ORAL_CAPSULE | ORAL | 0 refills | Status: DC

## 2022-12-13 NOTE — Progress Notes (Signed)
Subjective:    Patient ID: [name], male    DOB: 06/05/1934, 88 y.o.   MRN: [id number]  HPI:  1) Fatigue: new and worsened since the hospitalization -walking is difficult due to fatigue -says he does not eat very well -did a blood test with Mike. Jacinto Fernandez -sleeps well  2) Suboptimal testosterone  -he is interested in resistance training -he does not eat much  [name] is a 88 y.o. male who returns for  HFU appointmen for follow up of his Ischemic cerebrovascular accident, Essential Hypertension and Hyperlipidemia. He presented to [facility] ED  on 10/01/2022 with diplopia and difficulty with balance.   H&P: Mike [name]:  This is a new problem. The current episode started less than 1 hour ago. The problem occurs constantly. The problem has not changed since onset.Pertinent negatives include no chest pain, no abdominal pain, no headaches and no shortness of breath. Nothing aggravates the symptoms. Nothing relieves the symptoms. He has tried nothing for the symptoms. The treatment provided no relief.   [name] is a 88 y.o. male with a past medical history significant for CAD, hypertension, hyperlipidemia, diabetes, and sleep apnea who presents for neurologic complaint.  According to patient and family, he was last normal approximately 1 hour prior to arrival at 7:45 PM when he noticed sudden onset of double vision and difficulty with ambulation due to unsteadiness.  He has never had this before.  No history of strokes reported.  No history of TIAs to his knowledge.  He was at his baseline without any symptoms and drove somewhere before this onset.  Denies any headache or neck pain.  Denies trauma.  Denies palpitations, chest pain, shortness of breath.  No history of A-fib to his knowledge.  No vomiting but had some mild nausea with that initially.  Denies any speech difficulties or any numbness tingling or weakness of extremities.   I was called to see patient in triage and on  my evaluation, patient does have disconjugate gaze.  His right eye will not look laterally.  He does not have a facial droop and has intact sensation throughout.  Intact grip strength bilaterally and intact strength in the legs.  Finger-nose-finger was difficult due to the vision changes but appeared intact.  Patient did have right-sided hemianopia in both eyes that he does not know when it began.   Patient was activated as a code stroke and quickly taken to CT scanner.  CT initially did not show evidence of acute bleed or acute stroke.  Neurology felt that although the onset is quite clear when the diplopia began, the hemianopia is uncertain.  Thus, patient is not a candidate for TNK at this time.   They recommended MRI and admission to medicine to [facility] for further stroke workup but get CTA and perfusion if possible initially.   CTA was completed and patient does have a P1 occlusion acutely.  There is penumbra and no core infarct at this time.   I coordinated and spoke with [name] with teleneurology and [name] with [facility] neurology team and they spoke with the interventional neuroradiologist who feels patient is a candidate for thrombectomy.  CT Head: WO Contrast:  IMPRESSION: 1. No acute intracranial process. 2. ASPECTS is 10.  CTA:  IMPRESSION: 1. Short segment occlusion of the proximal left P1 segment, with additional moderate stenosis in the distal left P2 segment. 2. No infarct core identified. Possible area of decreased perfusion in  the superior left cerebellum or left occipital lobe. This could reflect decreased perfusion related to short segment left P1 occlusion. Superior cerebellar decreased perfusion 3. Severe stenosis in the left supraclinoid ICA and moderate stenosis in the right supraclinoid ICA. 4. Severe stenosis at the origin of the right PICA. Mild stenosis in the distal basilar artery. 5. No hemodynamically significant stenosis in the  neck. 6. Aortic atherosclerosis.  MR Brain: WO Contrast:  IMPRESSION: 1. Small acute infarcts scattered in the bilateral cerebellum (left greater than right). Punctate acute lacunar infarcts in both the left pons and thalamus. And minimal occipital lobe restricted diffusion, perhaps greater on the right. 2. No hemorrhagic transformation or intracranial mass effect. 3. Underlying chronic small vessel disease, including chronic thalamic microhemorrhages, and nonspecific ventricular enlargement.   He was transferred to [facility]. He underwent thrombectomy.   [name] was admitted to inpatient rehabilitation on 10/06/2022 and discharged home on 10/12/2022. He is receiving Outpatient Therapy at [facility]. He denies any pain. He rates his pain 0. Also reports his appetite is fair.   Daughter in room, all questions answered.   [name] was driving, discharge instructions stated no driving. This was discussed with [name]. Mike. Carlis Fernandez spoke with patient and daughter, they discussed him driving. [name] gave [name] instructions for driving.   Pain Inventory Average Pain 0 Pain Right Now 0 My pain is  No pain. Only problem with eye sight  and balance  LOCATION OF PAIN  No pain RT great toe  BOWEL Number of stools per week: 1-2 Oral laxative use  stool softener   BLADDER Normal and Pads Taking medication for bladder control.   Mobility use a cane ability to climb steps?  yes do you drive?  yes Do you have any goals in this area?  yes  Function retired I need assistance with the following:  meal prep and household duties Do you have any goals in this area?  yes  Neuro/Psych trouble walking  Prior Studies Any changes since last visit?  no  Physicians involved in your care Any changes since last visit?  no   Family History  Problem Relation Age of Onset   Heart attack Mother    Clotting disorder Father        died of renal disease   Heart disease  Sister    Cancer Sister    Colon cancer Neg Hx    Social History   Socioeconomic History   Marital status: Widowed    Spouse name: Not on file   Number of children: Not on file   Years of education: Not on file   Highest education level: Not on file  Occupational History   Occupation: Field seismologist  Tobacco Use   Smoking status: Never   Smokeless tobacco: Never  Vaping Use   Vaping status: Never Used  Substance and Sexual Activity   Alcohol use: No    Alcohol/week: 1.0 standard drink of alcohol    Types: 1 Glasses of wine per week   Drug use: No   Sexual activity: Not on file  Other Topics Concern   Not on file  Social History Narrative   Retired Surveyor, minerals.  Widowed since 2014.  He has multiple children.  Never smoker 5 caffeinated beverages daily and no alcohol   Social Determinants of Health   Financial Resource Strain: Not on file  Food Insecurity: Not on file  Transportation Needs: Not on file  Physical Activity: Not on file  Stress: Not on file  Social Connections: Not on file   Past Surgical History:  Procedure Laterality Date   COLONOSCOPY W/ BIOPSIES AND POLYPECTOMY     CORONARY ARTERY BYPASS GRAFT  2008   VESSELS X6   IR CT HEAD LTD  10/02/2022   IR PERCUTANEOUS ART THROMBECTOMY/INFUSION INTRACRANIAL INC DIAG ANGIO  10/02/2022   IR US GUIDE VASC ACCESS RIGHT  10/02/2022   LOOP RECORDER IMPLANT     RADIOLOGY WITH ANESTHESIA N/A 10/02/2022   Procedure: IR WITH ANESTHESIA;  Surgeon: Radiologist, Medication, MD;  Location: [facility] OR;  Service: Radiology;  Laterality: N/A;   UMBILICAL HERNIA REPAIR     Past Medical History:  Diagnosis Date   Acute renal failure (HCC)    secondary to nephrotic syndrome post cath, resolved with steroids   Anxiety    Arthritis    CAD (coronary artery disease) 04/2006   LIMA to LAD, sequential SVG to first, second, and third OM's, sequential SVG to mid RCA and PDA    Depression    Diabetes mellitus without complication  [facility])    ED (erectile dysfunction)    HTN (hypertension)    Hx of adenomatous colonic polyps    Hyperlipidemia    Loop recorder: Biotronic Biomonitor II loop 07/28/2021 07/28/2021   Obesity    Stroke (HCC)    june   There were no vitals taken for this visit.  Opioid Risk Score:   Fall Risk Score:  `1  Depression screen [facility] 2/9     12/12/2022   10:59 AM 11/07/2022   10:51 AM  Depression screen PHQ 2/9  Decreased Interest 0 1  Down, Depressed, Hopeless 0 1  PHQ - 2 Score 0 2  Altered sleeping  0  Tired, decreased energy  1  Change in appetite  1  Feeling bad or failure about yourself   1  Trouble concentrating  1  Moving slowly or fidgety/restless  1  Suicidal thoughts  0  PHQ-9 Score  7    Review of Systems  Eyes:  Positive for visual disturbance.  Musculoskeletal:        Balance  All other systems reviewed and are negative.      Objective:   Not performed       Assessment & Plan:  1.Ischemic cerebrovascular accident: Continue Outpatient Therapy at [facility]. He has a scheduled appointment with Neurology. Continue current medication regimen. Continue to Monitor.   -discussed return to driving and that he is doing well.   -continue PT and OT  2. Essential Hypertension: Cardiology following. Continue current medication regimen. Continue to Monitor.   3.  Hyperlipidemia: Continue current medication regimen: PCP/Cardiology following. Continue to Monitor.   4. Daytime fatigue:  -B12, vitamin D, iron ordered -testosterone level ordered  5. Bradycardia: -discussed that acebutolol ordered -f/u with cardiology to see if acebutolol can be decreased further  6. Type 2 diabetes: -discussed that goal blood sugar 70-90.   7) Vitamin D insufficiency:  -ergocalciferol 50,000U prescribed once per week for 7 weeks  5 minutes spent in discussion of low vitamin D levels, recommending ergocalciferol 50,000U once per week for 7 weeks, discussed that this can help with  fatigue and suboptimal testosterone levels

## 2022-12-15 ENCOUNTER — Ambulatory Visit: Payer: HMO | Admitting: Physical Therapy

## 2022-12-15 ENCOUNTER — Encounter: Payer: Self-pay | Admitting: Physical Therapy

## 2022-12-15 DIAGNOSIS — R2689 Other abnormalities of gait and mobility: Secondary | ICD-10-CM

## 2022-12-15 DIAGNOSIS — R2681 Unsteadiness on feet: Secondary | ICD-10-CM | POA: Diagnosis not present

## 2022-12-15 NOTE — Therapy (Signed)
OUTPATIENT PHYSICAL THERAPY NEURO TREATMENT  Patient Name: [name] MRN: [id number] DOB:[birth year], 88 y.o., male Today's Date: 12/15/2022   PCP: Cleatis Polka., MD  REFERRING PROVIDER: Horton Chin, MD  END OF SESSION:  PT End of Session - 12/15/22 0933     Visit Number 8    Number of Visits 16    Date for PT Re-Evaluation 01/06/23    Authorization Type [employer]    Progress Note Due on Visit 10    PT Start Time 0934    PT Stop Time 1015    PT Time Calculation (min) 41 min    Equipment Utilized During Treatment Gait belt    Activity Tolerance Patient tolerated treatment well    Behavior During Therapy WFL for tasks assessed/performed                  Past Medical History:  Diagnosis Date   Acute renal failure (HCC)    secondary to nephrotic syndrome post cath, resolved with steroids   Anxiety    Arthritis    CAD (coronary artery disease) 04/2006   LIMA to LAD, sequential SVG to first, second, and third OM's, sequential SVG to mid RCA and PDA    Depression    Diabetes mellitus without complication (HCC)    ED (erectile dysfunction)    HTN (hypertension)    Hx of adenomatous colonic polyps    Hyperlipidemia    Loop recorder: Biotronic Biomonitor II loop 07/28/2021 07/28/2021   Obesity    Stroke (HCC)    june   Past Surgical History:  Procedure Laterality Date   COLONOSCOPY W/ BIOPSIES AND POLYPECTOMY     CORONARY ARTERY BYPASS GRAFT  2008   VESSELS X6   IR CT HEAD LTD  10/02/2022   IR PERCUTANEOUS ART THROMBECTOMY/INFUSION INTRACRANIAL INC DIAG ANGIO  10/02/2022   IR US GUIDE VASC ACCESS RIGHT  10/02/2022   LOOP RECORDER IMPLANT     RADIOLOGY WITH ANESTHESIA N/A 10/02/2022   Procedure: IR WITH ANESTHESIA;  Surgeon: Radiologist, Medication, MD;  Location: [facility] OR;  Service: Radiology;  Laterality: N/A;   UMBILICAL HERNIA REPAIR     Patient Active Problem List   Diagnosis Date Noted   Ischemic cerebrovascular accident (CVA) (HCC)  10/06/2022   Status post surgery 10/02/2022   Stroke: Multiple scattered bilateral ischemic infarcts s/p thrombectomy of left P1/PCA TICI 3 Etiology: Likely cardioembolic 10/02/2022   Hemianopsia 10/01/2022   Right homonymous hemianopsia 10/01/2022   Loop recorder: Biotronic Biomonitor II loop 07/28/2021 07/28/2021   Unsteady gait 08/05/2020   BPH with obstruction/lower urinary tract symptoms 01/21/2020   Anxiety disorder 02/02/2018   Right hip pain 10/02/2017   Recurrent major depression in remission (HCC) 01/27/2017   Chronic kidney disease, stage 3a (HCC) 07/17/2015   Personal history of colonic adenomas 03/26/2013   Obesity 10/04/2012   OBSTRUCTIVE SLEEP APNEA 10/30/2008   HYPERLIPIDEMIA-MIXED 07/01/2008   HYPERTENSION, BENIGN 07/01/2008   CAD, NATIVE VESSEL 07/01/2008   FATIGUE / MALAISE 07/01/2008   EDEMA 07/01/2008    ONSET DATE: 10/02/22  REFERRING DIAG: I63.9 (ICD-10-CM) - Cerebral infarction, unspecified  THERAPY DIAG:  Unsteadiness on feet  Other abnormalities of gait and mobility  Rationale for Evaluation and Treatment: Rehabilitation  SUBJECTIVE:  SUBJECTIVE STATEMENT: Saw Dr. Carlis Abbott and she added Vit. D.  Toe is a little better today.  Pt accompanied by: self  PERTINENT HISTORY: Anxiety, CAD, depression, DM, HTN, HLD, loop recorder, CABG 2008  PAIN:  Are you having pain? No Occasional pain in R arm  PRECAUTIONS: Fall, ICD/Pacemaker, and Other: loop recorder .  Pt reports 11/25/2022 visit that he has wound on buttocks from "when in the hospital"  RED FLAGS: None   WEIGHT BEARING RESTRICTIONS: No  FALLS: Has patient fallen in last 6 months? Yes. Number of falls 1 son reports syncopal episode caused the fall  LIVING ENVIRONMENT: Lives with: lives alone Lives in:  House/apartment Stairs:  2 steps to enter without handrail; 2nd story home but stays on 1st floor Has following equipment at home: Dan Humphreys - 2 wheeled and walking stick  PLOF: Independent; has cleaning services   PATIENT GOALS: improve balance   OBJECTIVE:    TODAY'S TREATMENT: 12/15/2022 Activity Comments  Vitals 108/68, HR 102   Warm up: -forward/back walking x 2 min -sidestepping x 2 min (No support, in gym area) -tandem gait forward/back x 2 min>tandem march (In parallel bars   -Forward step over obstacle and return to midline -Side step over obstacle and return to midline 3# RLE, multiple episodes of R foot catching obstacle  Standing on Airex: -minisquats x 10 -minisquats with overhead lift x 10 Pt reports feeling lightheaded Vitals 117/67 HR 94  LAQ 3 x 10 reps 4# Fatigues and has decreased eccentric control          Access Code: [email or web address] URL: [email or web address] Date: 12/05/2022 Prepared by: [facility] - Outpatient  Rehab - [facility]  Exercises - Narrow Stance with Counter Support  - 1 x daily - 5 x weekly - 2 sets - 30 sec hold - Romberg Stance with Eyes Closed  - 1 x daily - 5 x weekly - 2 sets - 30 sec hold - Tandem Walking with Counter Support  - 1 x daily - 5 x weekly - 2 sets - 10 reps - Forward Step Touch  - 1 x daily - 7 x weekly - 2 sets - 10 reps - Single Leg Stance with Support  - 1 x daily - 7 x weekly - 1 sets - 3 reps - 10 sec hold    PATIENT EDUCATION: Education details: continue current HEP Person educated: Patient Education method: Explanation, Demonstration, and Handouts Education comprehension: verbalized understanding, returned demonstration, and needs further education      -------------------------------------------------------------- Objective measures below taken at initial evaluation:  DIAGNOSTIC FINDINGS: 10/02/22 brain MRI: Small acute infarcts scattered in the bilateral cerebellum (left greater than  right). Punctate acute lacunar infarcts in both the left pons and thalamus. And minimal occipital lobe restricted diffusion, perhaps greater on the right.   10/01/22 head/neck angio CT: Short segment occlusion of the proximal left P1 segment, with additional moderate stenosis in the distal left P2 segment. Severe stenosis in the left supraclinoid ICA and moderate stenosis in the right supraclinoid ICA. 4. Severe stenosis at the origin of the right PICA. Mild stenosis in the distal basilar artery.   COGNITION: Overall cognitive status: Within functional limits for tasks assessed   SENSATION: Denies N/T in UEs/LEs   COORDINATION: Alternating pronation/supination: dysmetric L>R Alternating toe tap: dysmetric L>R Finger to nose: dysmetric L>R Heel to shin: intact B     MUSCLE TONE: WNL but B calf very tight    POSTURE: rounded shoulders  LOWER EXTREMITY  ROM:     Active  Right Eval Left Eval  Hip flexion    Hip extension    Hip abduction    Hip adduction    Hip internal rotation    Hip external rotation    Knee flexion    Knee extension    Ankle dorsiflexion 4 *knee bent 8 knee bent  Ankle plantarflexion    Ankle inversion    Ankle eversion     (Blank rows = not tested)   LOWER EXTREMITY MMT:    MMT (in sitting) Right Eval Left Eval  Hip flexion 4 4-  Hip extension    Hip abduction 4 3+  Hip adduction 3+ 3+  Hip internal rotation    Hip external rotation    Knee flexion 4 4  Knee extension 5 5  Ankle dorsiflexion 4+ 4  Ankle plantarflexion 4+ 4+  Ankle inversion    Ankle eversion    (Blank rows = not tested)   GAIT: Gait pattern: Slow, wide BOS, unsteady  Assistive device utilized: None Level of assistance: SBA   FUNCTIONAL TESTS:       PATIENT SURVEYS:  FOTO 47, 55    GOALS: Goals reviewed with patient? Yes  SHORT TERM GOALS: Target date: 11/11/2022  Patient to be independent with initial HEP. Baseline: HEP initiated Goal  status: MET    LONG TERM GOALS: Target date: 11/25/2022>UPDATED TARGET 01/06/2023  Patient to be independent with advanced HEP. Baseline: Not yet initiated, 11/25/2022: initiated, but pt reports not doing regularly Goal status: IN PROGRESS  Patient to demonstrate B ankle DF AROM to at least 10 deg to improve safety with gait.  Baseline: 4 deg, 8 deg with compensations >11/25/2022 met 10 degrees bilaterally Goal status: MET8/12/2022  Patient to demonstrate alternating reciprocal pattern when ascending and descending stairs with good stability and 1 handrail as needed.   Baseline: NT Goal status: MET 11/25/2022  Patient to complete TUG in <14 sec with LRAD in order to decrease risk of falls.   Baseline: 21.38 sec>19.12 sec 11/25/2022 Goal status: IN PROGRESS  Patient to demonstrate 5xSTS test in <15 sec with good control and posture in order to decrease risk of falls.  Baseline: 13.92 sec with limited eccentric control and not standing fully> 16.25 sec with use of UE support today, not standing fully Goal status: IN PROGRESS  Patient to score at least 47/56 on Berg in order to decrease risk of falls.  Baseline: 42>40/56 11/25/2022 Goal status: IN PROGRESS  Patient to score at least 55 on FOTO in order to indicate improved functional outcomes.  Baseline: 47>48 11/25/2022 Goal status: IN PROGRESS   ASSESSMENT:  CLINICAL IMPRESSION: Focused skilled PT session today on standing balance/functional strength, with pt continueing to note decreased RLE strength.  Utilized 3# weight for standing exercises, with pt fatiguing before the end of set.  He will continue to benefit from skilled PT to further address balance, and may need to include hip/hamstring strength as part of balance retraining.    OBJECTIVE IMPAIRMENTS: Abnormal gait, decreased balance, decreased coordination, and impaired flexibility.   ACTIVITY LIMITATIONS: carrying, lifting, bending, standing, stairs, transfers, bathing, toileting,  dressing, reach over head, hygiene/grooming, and locomotion level  PARTICIPATION LIMITATIONS: meal prep, cleaning, laundry, driving, shopping, community activity, yard work, and church  PERSONAL FACTORS: Age, Behavior pattern, Past/current experiences, Time since onset of injury/illness/exacerbation, and 3+ comorbidities: Anxiety, CAD, depression, DM, HTN, HLD, loop recorder, CABG 2008  are also affecting patient's functional outcome.  REHAB POTENTIAL: Good  CLINICAL DECISION MAKING: Evolving/moderate complexity  EVALUATION COMPLEXITY: Moderate  PLAN:  PT FREQUENCY: 2x/week  PT DURATION: 6 weeks per recert 11/25/2022  PLANNED INTERVENTIONS: Therapeutic exercises, Therapeutic activity, Neuromuscular re-education, Balance training, Gait training, Patient/Family education, Self Care, Joint mobilization, Stair training, Vestibular training, Canalith repositioning, DME instructions, Aquatic Therapy, Dry Needling, Electrical stimulation, Cryotherapy, Moist heat, Taping, Manual therapy, and Re-evaluation  PLAN FOR NEXT SESSION: Continue to progress HEP (add some hip strength for stability/balance); work on SLS, tandem stance and gait, balance activities on compliant surfaces.   Lonia Blood, PT 12/15/22 10:18 AM Phone: [phone] Fax: [phone]  [facility] at [facility] [address] [place], [address] [place], [place] [address] Phone # [phone] Fax # [phone]

## 2022-12-21 ENCOUNTER — Ambulatory Visit: Payer: HMO | Attending: Physical Medicine and Rehabilitation | Admitting: Physical Therapy

## 2022-12-21 DIAGNOSIS — R2689 Other abnormalities of gait and mobility: Secondary | ICD-10-CM | POA: Insufficient documentation

## 2022-12-21 DIAGNOSIS — R2681 Unsteadiness on feet: Secondary | ICD-10-CM | POA: Insufficient documentation

## 2022-12-23 ENCOUNTER — Ambulatory Visit: Payer: HMO | Admitting: Physical Therapy

## 2022-12-23 DIAGNOSIS — Z4509 Encounter for adjustment and management of other cardiac device: Secondary | ICD-10-CM | POA: Diagnosis not present

## 2022-12-23 DIAGNOSIS — R55 Syncope and collapse: Secondary | ICD-10-CM | POA: Diagnosis not present

## 2022-12-23 DIAGNOSIS — Z95818 Presence of other cardiac implants and grafts: Secondary | ICD-10-CM | POA: Diagnosis not present

## 2022-12-26 NOTE — Telephone Encounter (Signed)
ERROR

## 2022-12-28 ENCOUNTER — Ambulatory Visit: Payer: HMO | Admitting: Physical Therapy

## 2022-12-28 ENCOUNTER — Encounter: Payer: Self-pay | Admitting: Cardiology

## 2022-12-28 ENCOUNTER — Encounter: Payer: Self-pay | Admitting: Physical Therapy

## 2022-12-28 ENCOUNTER — Ambulatory Visit: Payer: HMO | Admitting: Cardiology

## 2022-12-28 VITALS — BP 99/77 | HR 94 | Resp 16 | Ht 69.0 in | Wt 172.8 lb

## 2022-12-28 DIAGNOSIS — I5032 Chronic diastolic (congestive) heart failure: Secondary | ICD-10-CM

## 2022-12-28 DIAGNOSIS — R2681 Unsteadiness on feet: Secondary | ICD-10-CM

## 2022-12-28 DIAGNOSIS — I4819 Other persistent atrial fibrillation: Secondary | ICD-10-CM | POA: Diagnosis not present

## 2022-12-28 DIAGNOSIS — I2510 Atherosclerotic heart disease of native coronary artery without angina pectoris: Secondary | ICD-10-CM

## 2022-12-28 DIAGNOSIS — I10 Essential (primary) hypertension: Secondary | ICD-10-CM | POA: Diagnosis not present

## 2022-12-28 DIAGNOSIS — R2689 Other abnormalities of gait and mobility: Secondary | ICD-10-CM

## 2022-12-28 MED ORDER — FUROSEMIDE 40 MG PO TABS
40.0000 mg | ORAL_TABLET | Freq: Every day | ORAL | Status: AC | PRN
Start: 2022-12-28 — End: ?

## 2022-12-28 NOTE — Progress Notes (Signed)
Primary Physician/Referring:  Cleatis Polka., MD  Patient ID: [name], male    DOB: 09-Nov-1934, 88 y.o.   MRN: [id number]  Chief Complaint  Patient presents with    Orthostatic hypotension   Coronary Artery Disease   Follow-up   HPI:    [name]  is a 88 y.o. history of CAD s/p CABG  in 2008 with LIMA to LAD, sequential saphenous vein graft to first second and third obtuse marginals, and sequential saphenous vein graft to the RCA and right PDA. He has history of hypertension, hyperlipidemia, diabetes mellitus, GERD and esophageal and gastric ulcers diagnosed in Jan 2023 for dysphagia.  He has not had any bleeding diathesis and doing well.  Due to recurrent syncope underwent loop recorder plantation on 07/28/2021.    Patient presented on 10/01/2022 with visual disturbances, has this to her left facial droop, trace left arm weakness, left hemianopia and found to have multiple scattered bilateral ischemic infarcts status post thrombectomy of the left P1 segment of the posterior cerebral artery.  Patient upon admission was found to have atypical atrial flutter.   This is a 47-month office visit, he has recuperated very well and he has now started to drive and also has resumed all his physical activity.  He is tolerating all the medications well.  He has no specific complaints today.  Past Medical History:  Diagnosis Date   Acute renal failure (HCC)    secondary to nephrotic syndrome post cath, resolved with steroids   Anxiety    Arthritis    CAD (coronary artery disease) 04/2006   LIMA to LAD, sequential SVG to first, second, and third OM's, sequential SVG to mid RCA and PDA    Depression    Diabetes mellitus without complication [facility])    ED (erectile dysfunction)    HTN (hypertension)    Hx of adenomatous colonic polyps    Hyperlipidemia    Loop recorder: Biotronic Biomonitor II loop 07/28/2021 07/28/2021   Obesity    Stroke (HCC)    june   Past Surgical History:   Procedure Laterality Date   COLONOSCOPY W/ BIOPSIES AND POLYPECTOMY     CORONARY ARTERY BYPASS GRAFT  2008   VESSELS X6   IR CT HEAD LTD  10/02/2022   IR PERCUTANEOUS ART THROMBECTOMY/INFUSION INTRACRANIAL INC DIAG ANGIO  10/02/2022   IR US GUIDE VASC ACCESS RIGHT  10/02/2022   LOOP RECORDER IMPLANT     RADIOLOGY WITH ANESTHESIA N/A 10/02/2022   Procedure: IR WITH ANESTHESIA;  Surgeon: Radiologist, Medication, MD;  Location: [facility] OR;  Service: Radiology;  Laterality: N/A;   UMBILICAL HERNIA REPAIR     Family History  Problem Relation Age of Onset   Heart attack Mother    Clotting disorder Father        died of renal disease   Heart disease Sister    Cancer Sister    Colon cancer Neg Hx     Social History   Tobacco Use   Smoking status: Never   Smokeless tobacco: Never  Substance Use Topics   Alcohol use: No    Alcohol/week: 1.0 standard drink of alcohol    Types: 1 Glasses of wine per week   Marital Status: Widowed  ROS  Review of Systems  Cardiovascular:  Positive for leg swelling (occasional). Negative for chest pain and dyspnea on exertion.  Neurological:  Negative for disturbances in coordination.   Objective      12/28/2022  11:36 AM 12/12/2022   10:56 AM 11/29/2022   11:30 AM  Vitals with BMI  Height 5\' 9"  5\' 9"    Weight 172 lbs 13 oz 174 lbs   BMI 25.51 25.68   Systolic 99 151 122  Diastolic 77 72 72  Pulse 94 55 50   Blood pressure 99/77, pulse 94, resp. rate 16, height 5\' 9"  (1.753 m), weight 172 lb 12.8 oz (78.4 kg), SpO2 96%. Orthostatic VS for the past 72 hrs (Last 3 readings):  Patient Position BP Location Cuff Size  12/28/22 1136 Sitting Left Arm Normal     Physical Exam Neck:     Vascular: No carotid bruit or JVD.  Cardiovascular:     Rate and Rhythm: Normal rate. Rhythm irregular. Occasional Extrasystoles are present.    Pulses: Intact distal pulses.     Heart sounds: Normal heart sounds. No murmur heard.    No gallop.  Pulmonary:      Effort: Pulmonary effort is normal.     Breath sounds: Normal breath sounds.  Abdominal:     General: Bowel sounds are normal.     Palpations: Abdomen is soft.  Musculoskeletal:     Right lower leg: No edema.     Left lower leg: No edema.     Laboratory examination:   Recent Labs    10/09/22 0641 10/10/22 0558 11/29/22 0953 12/07/22 1116  NA 135 133* 132* 139  K 3.6 3.5 4.8 4.9  CL 103 101 99 98  CO2 23 22 24 26   GLUCOSE 129* 155* 215* 282*  BUN 20 18 27* 42*  CREATININE 1.47* 1.48* 1.56* 1.54*  CALCIUM 8.7* 8.5* 9.1 9.7  GFRNONAA 46* 46* 43*  --     Lab Results  Component Value Date   GLUCOSE 282 (H) 12/07/2022   NA 139 12/07/2022   K 4.9 12/07/2022   CL 98 12/07/2022   CO2 26 12/07/2022   BUN 42 (H) 12/07/2022   CREATININE 1.54 (H) 12/07/2022   GFRNONAA 43 (L) 11/29/2022   CALCIUM 9.7 12/07/2022   PROT 7.2 11/29/2022   ALBUMIN 3.9 11/29/2022   BILITOT 0.8 11/29/2022   ALKPHOS 81 11/29/2022   AST 42 (H) 11/29/2022   ALT 27 11/29/2022   ANIONGAP 9 11/29/2022      Lab Results  Component Value Date   ALT 27 11/29/2022   AST 42 (H) 11/29/2022   ALKPHOS 81 11/29/2022   BILITOT 0.8 11/29/2022       Latest Ref Rng & Units 12/07/2022   11:17 AM 11/29/2022    9:53 AM 10/11/2022    6:10 AM  CBC  WBC 3.4 - 10.8 x10E3/uL 10.2  10.5  13.1   Hemoglobin 13.0 - 17.7 g/dL 40.1  02.7  25.3   Hematocrit 37.5 - 51.0 % 42.5  39.4  35.9   Platelets 150 - 450 x10E3/uL 298  157  216        Latest Ref Rng & Units 11/29/2022    9:53 AM 10/07/2022    7:58 AM 10/01/2022    8:57 PM  Hepatic Function  Total Protein 6.5 - 8.1 g/dL 7.2  6.1  7.7   Albumin 3.5 - 5.0 g/dL 3.9  3.0  4.3   AST 15 - 41 U/L 42  51  20   ALT 0 - 44 U/L 27  47  15   Alk Phosphatase 38 - 126 U/L 81  54  64   Total Bilirubin 0.3 - 1.2 mg/dL  0.8  1.1  0.5     Lipid Panel Recent Labs    10/02/22 0227  CHOL 136  TRIG 68  LDLCALC 77  VLDL 14  HDL 45  CHOLHDL 3.0    HEMOGLOBIN A1C Lab  Results  Component Value Date   HGBA1C 8.4 (H) 10/02/2022   MPG 194.38 10/02/2022   No results found for: "TSH"   External labs:   Labs 10/02/2021:   Hb 17.0/HCT 51.9, platelets 201.  Normal indicis.   A1c 9.9%.   Urine analysis protein negative.  3+ glucose.   Labs 10/20/2021:   Serum glucose 183 mg, BUN 25, creatinine 1.3, EGFR 52.5 mL, potassium 5.3, LFTs normal.   Total cholesterol 118, triglycerides 80, HDL 36, LDL 66.   A1c 7.8%.  TSH normal at 0.64.  Radiology:   CT angiogram chest 09/28/2021: 1. No aortic aneurysm or dissection. 2. No pulmonary artery embolus identified. 3. Cholelithiasis with mild thickened appearance of the gallbladder wall. Correlation with ultrasound is recommended to exclude acute cholecystitis. 4. Fatty liver. 5. There is a 3 mm right upper lobe subpleural nodule. No follow-up needed if patient is low-risk.T   MRI of the brain 10/02/2022: 1. Small acute infarcts scattered in the bilateral cerebellum (left greater than right). Punctate acute lacunar infarcts in both the left pons and thalamus. And minimal occipital lobe restricted diffusion, perhaps greater on the right. 2. No hemorrhagic transformation or intracranial mass effect. 3. Underlying chronic small vessel disease, including chronic thalamic microhemorrhages, and nonspecific ventricular enlargement.  Cardiac Studies:   PCV MYOCARDIAL PERFUSION WITH LEXISCAN 07/05/2021  Narrative   Findings are consistent with ischemia. The study is high risk.   Left ventricular function is abnormal. The left ventricular ejection fraction is severely decreased (<30%). End diastolic cavity size is normal.  Lexiscan (with Mod Bruce protocol) Nuclear stress test 07/05/2021: Nondiagnostic ECG stress. The heart rate response was consistent with Lexiscan. Occasional PACs and PVCs. There is a reversible moderate defect in the inferior and apical regions. Overall LV systolic function is abnormal with  global hypokinesis and inferior akinesis. Stress LV EF: 28%. Compared to the report of study done on 09/04/2017, inferior wall defect noted as scar without ischemia, LVEF was mildly decreased at 50%.  High risk study due to marked decrease in LVEF.  ECHO COMPLETE WITH IMAGING ENHANCING AGENT 10/02/2022 1. Left ventricular ejection fraction, by estimation, is 60 to 65%. The left ventricle has normal function. The left ventricle has no regional wall motion abnormalities. Left ventricular diastolic parameters are indeterminate. 2. Right ventricular systolic function is mildly reduced. The right ventricular size is normal. Tricuspid regurgitation signal is inadequate for assessing PA pressure. The estimated right ventricular systolic pressure is 37.8 mmHg. 3. Left atrial size was mild to moderately dilated. 4. The mitral valve is degenerative. Mild mitral valve regurgitation. No evidence of mitral stenosis. 5. The aortic valve is grossly normal. There is mild calcification of the aortic valve. There is mild thickening of the aortic valve. Aortic valve regurgitation is not visualized. No aortic stenosis is present. 6. The inferior vena cava is dilated in size with <50% respiratory variability, suggesting right atrial pressure of 15 mmHg.    Loop recorder: Biotronic Biomonitor II loop 07/28/2021 for syncope     Remote loop recorder check 09/21/2022 Predominant rhythm is normal sinus rhythm. Atrial monitoring episodes = PACs, PVCs, brief AT/AF/SVT. No symptoms reported. No heart block.    In person loop check 10/03/2022: Patient AF burden less than  2%, no persistent atrial fibrillation.  Probably under sensing P waves as patient EKG correlation reveals atrial flutter on surface EKG.  EKG:   EKG 12/28/2022: Atrial fibrillation with controlled ventricular response of rate of 94 bpm, inferior infarct old.  No evidence of ischemia.  Compared to 06/29/2022, no significant change.   Medications and allergies    Allergies  Allergen Reactions   Ketorolac Tromethamine Other (See Comments)    nephrotic syndrome     Medication list   Current Outpatient Medications:    acebutolol (SECTRAL) 200 MG capsule, Take 1 capsule (200 mg total) by mouth 2 (two) times daily., Disp: 180 capsule, Rfl: 1   acetaminophen (TYLENOL) 325 MG tablet, Take 2 tablets (650 mg total) by mouth every 4 (four) hours as needed for mild pain (or temp > 37.5 C (99.5 F))., Disp: , Rfl:    apixaban (ELIQUIS) 5 MG TABS tablet, Take 5 mg by mouth 2 (two) times daily., Disp: , Rfl:    atorvastatin (LIPITOR) 80 MG tablet, Take 1 tablet (80 mg total) by mouth daily., Disp: 30 tablet, Rfl: 0   empagliflozin (JARDIANCE) 25 MG TABS tablet, Take 1 tablet (25 mg total) by mouth daily., Disp: 30 tablet, Rfl: 0   escitalopram (LEXAPRO) 10 MG tablet, Take 10 mg by mouth daily., Disp: , Rfl:    ezetimibe (ZETIA) 10 MG tablet, Take 10 mg by mouth daily., Disp: , Rfl:    glucose blood test strip, USE 1 STRIP TO CHECK GLUCOSE 2 TO 4 TIMES DAILY, Disp: , Rfl:    METFORMIN HCL PO, , Disp: , Rfl:    mirabegron ER (MYRBETRIQ) 50 MG TB24 tablet, Take 1 tablet (50 mg total) by mouth daily., Disp: 30 tablet, Rfl: 0   mupirocin ointment (BACTROBAN) 2 %, Apply 1 Application topically 2 (two) times daily., Disp: , Rfl:    olmesartan (BENICAR) 20 MG tablet, Take 1 tablet (20 mg total) by mouth daily., Disp: 30 tablet, Rfl: 0   ONETOUCH VERIO test strip, 1 each by Other route daily., Disp: , Rfl:    tadalafil (CIALIS) 5 MG tablet, Take 1 tablet (5 mg total) by mouth daily., Disp: 10 tablet, Rfl: 0   tamsulosin (FLOMAX) 0.4 MG CAPS capsule, Take 1 capsule (0.4 mg total) by mouth daily after supper., Disp: 30 capsule, Rfl: 0   TRESIBA FLEXTOUCH 200 UNIT/ML FlexTouch Pen, Inject into the skin., Disp: , Rfl:    Vitamin D, Ergocalciferol, (DRISDOL) 1.25 MG (50000 UNIT) CAPS capsule, Take 1 capsule (50,000 Units total) by mouth every 7 (seven) days., Disp: 7  capsule, Rfl: 0   furosemide (LASIX) 40 MG tablet, Take 1 tablet (40 mg total) by mouth daily as needed for edema., Disp: , Rfl:   Assessment     ICD-10-CM   1. Coronary artery disease involving native coronary artery of native heart without angina pectoris  I25.10 EKG 12-Lead    2. Primary hypertension  I10     3. Persistent atrial fibrillation (HCC)  I48.19     4. Chronic diastolic heart failure (HCC)  Z61.09 furosemide (LASIX) 40 MG tablet       Orders Placed This Encounter  Procedures   EKG 12-Lead    Meds ordered this encounter  Medications   furosemide (LASIX) 40 MG tablet    Sig: Take 1 tablet (40 mg total) by mouth daily as needed for edema.    Medications Discontinued During This Encounter  Medication Reason   furosemide (LASIX) 40 MG  tablet       Recommendations:   [name] is a 88 y.o.  history of CAD s/p CABG  in 2008 with LIMA to LAD, sequential saphenous vein graft to first second and third obtuse marginals, and sequential saphenous vein graft to the RCA and right PDA. He has history of hypertension, hyperlipidemia, diabetes mellitus, GERD and esophageal and gastric ulcers diagnosed in Jan 2023 for dysphagia.  He has not had any bleeding diathesis and doing well.  Due to recurrent syncope underwent loop recorder plantation on 07/28/2021.    Patient presented on 10/01/2022 with visual disturbances, has this to her left facial droop, trace left arm weakness, left hemianopia and found to have multiple scattered bilateral ischemic infarcts status post thrombectomy of the left P1 segment of the posterior cerebral artery.  Patient upon admission was found to have atypical atrial flutter.  1. Coronary artery disease involving native coronary artery of native heart without angina pectoris Patient remains angina free.  He is presently on a low-dose of a beta-blocker with good control of heart rate and blood pressure. - EKG 12-Lead  2. Primary hypertension His  blood pressure at home has been up-and-down all over the place, however he did not bring his medication list or appropriate medications that he is taking, I have sent a message to his daughter so that I could upload and update his medications.  As his blood pressure is soft, advised him to take furosemide every other day or even every third day as he has no evidence of any leg edema.  There is no clinical evidence of heart failure either.  3. Persistent atrial fibrillation (HCC) Patient has persistent atrial fibrillation, I suspect this is go to be permanent, no plans on performing cardioversion, he is 88 years of age and has recuperated well from recent continue anticoagulation with Eliquis.  I will see him back in 6 months for follow-up.  4.  Chronic diastolic heart failure: Stable, reduced his furosemide dose to as needed use, he will continue with Jardiance.     Yates Decamp, MD, [facility] 12/28/2022, 2:09 PM Office: [phone]

## 2022-12-28 NOTE — Therapy (Signed)
OUTPATIENT PHYSICAL THERAPY NEURO TREATMENT  Patient Name: [name] MRN: [id number] DOB:Jan 28, 1935, 88 y.o., male Today's Date: 12/28/2022   PCP: Cleatis Polka., MD  REFERRING PROVIDER: Horton Chin, MD  END OF SESSION:  PT End of Session - 12/28/22 0855     Visit Number 9    Number of Visits 16    Date for PT Re-Evaluation 01/06/23    Authorization Type [employer]    Progress Note Due on Visit 10    PT Start Time 0854    PT Stop Time 0932    PT Time Calculation (min) 38 min    Equipment Utilized During Treatment Gait belt    Activity Tolerance Patient tolerated treatment well    Behavior During Therapy WFL for tasks assessed/performed                   Past Medical History:  Diagnosis Date   Acute renal failure (HCC)    secondary to nephrotic syndrome post cath, resolved with steroids   Anxiety    Arthritis    CAD (coronary artery disease) 04/2006   LIMA to LAD, sequential SVG to first, second, and third OM's, sequential SVG to mid RCA and PDA    Depression    Diabetes mellitus without complication (HCC)    ED (erectile dysfunction)    HTN (hypertension)    Hx of adenomatous colonic polyps    Hyperlipidemia    Loop recorder: Biotronic Biomonitor II loop 07/28/2021 07/28/2021   Obesity    Stroke (HCC)    june   Past Surgical History:  Procedure Laterality Date   COLONOSCOPY W/ BIOPSIES AND POLYPECTOMY     CORONARY ARTERY BYPASS GRAFT  2008   VESSELS X6   IR CT HEAD LTD  10/02/2022   IR PERCUTANEOUS ART THROMBECTOMY/INFUSION INTRACRANIAL INC DIAG ANGIO  10/02/2022   IR US GUIDE VASC ACCESS RIGHT  10/02/2022   LOOP RECORDER IMPLANT     RADIOLOGY WITH ANESTHESIA N/A 10/02/2022   Procedure: IR WITH ANESTHESIA;  Surgeon: Radiologist, Medication, MD;  Location: [facility] OR;  Service: Radiology;  Laterality: N/A;   UMBILICAL HERNIA REPAIR     Patient Active Problem List   Diagnosis Date Noted   Ischemic cerebrovascular accident (CVA) (HCC)  10/06/2022   Status post surgery 10/02/2022   Stroke: Multiple scattered bilateral ischemic infarcts s/p thrombectomy of left P1/PCA TICI 3 Etiology: Likely cardioembolic 10/02/2022   Hemianopsia 10/01/2022   Right homonymous hemianopsia 10/01/2022   Loop recorder: Biotronic Biomonitor II loop 07/28/2021 07/28/2021   Unsteady gait 08/05/2020   BPH with obstruction/lower urinary tract symptoms 01/21/2020   Anxiety disorder 02/02/2018   Right hip pain 10/02/2017   Recurrent major depression in remission (HCC) 01/27/2017   Chronic kidney disease, stage 3a (HCC) 07/17/2015   Personal history of colonic adenomas 03/26/2013   Obesity 10/04/2012   OBSTRUCTIVE SLEEP APNEA 10/30/2008   HYPERLIPIDEMIA-MIXED 07/01/2008   HYPERTENSION, BENIGN 07/01/2008   CAD, NATIVE VESSEL 07/01/2008   FATIGUE / MALAISE 07/01/2008   EDEMA 07/01/2008    ONSET DATE: 10/02/22  REFERRING DIAG: I63.9 (ICD-10-CM) - Cerebral infarction, unspecified  THERAPY DIAG:  Unsteadiness on feet  Other abnormalities of gait and mobility  Rationale for Evaluation and Treatment: Rehabilitation  SUBJECTIVE:  SUBJECTIVE STATEMENT: Going to cardiologist today.  Had a fall the other day-feel like I passed out.    Pt accompanied by: self  PERTINENT HISTORY: Anxiety, CAD, depression, DM, HTN, HLD, loop recorder, CABG 2008  PAIN:  Are you having pain? No Occasional pain in R arm  PRECAUTIONS: Fall, ICD/Pacemaker, and Other: loop recorder .  Pt reports 11/25/2022 visit that he has wound on buttocks from "when in the hospital"  RED FLAGS: None   WEIGHT BEARING RESTRICTIONS: No  FALLS: Has patient fallen in last 6 months? Yes. Number of falls 1 son reports syncopal episode caused the fall  LIVING ENVIRONMENT: Lives with: lives alone Lives  in: House/apartment Stairs:  2 steps to enter without handrail; 2nd story home but stays on 1st floor Has following equipment at home: Dan Humphreys - 2 wheeled and walking stick  PLOF: Independent; has cleaning services   PATIENT GOALS: improve balance   OBJECTIVE:    TODAY'S TREATMENT: 12/28/2022 Activity Comments  Vitals:   Seated:  136/84 HR 82 bpm Standing:  135/75   Forward/back walk in parallel bars x 2 min Tandem gait forward/back x 1 min>tandem march forward/back x 1 min Cues for 1 UE support  Standing : Alternating legs March in place 3 x 10 Hip abduction 3 x 10 Hip extension 3 x 10 Hamstring curls 3 x 10 Heel raises 3 x 10, 3 sec hold 3#  Gait with single walking pole, 25 ft x 6 reps Cues and assist for consistent sequence with RUE/LLE            Access Code: [email or web address] URL: [email or web address] Date: 12/05/2022 Prepared by: [facility] - Outpatient  Rehab - [facility]  Exercises - Narrow Stance with Counter Support  - 1 x daily - 5 x weekly - 2 sets - 30 sec hold - Romberg Stance with Eyes Closed  - 1 x daily - 5 x weekly - 2 sets - 30 sec hold - Tandem Walking with Counter Support  - 1 x daily - 5 x weekly - 2 sets - 10 reps - Forward Step Touch  - 1 x daily - 7 x weekly - 2 sets - 10 reps - Single Leg Stance with Support  - 1 x daily - 7 x weekly - 1 sets - 3 reps - 10 sec hold    PATIENT EDUCATION: Education details: Discussed importance of doing exercises consistently to get the most out of therapy; discussed using walking pole/cane for improved stability with gait Person educated: Patient Education method: Explanation, Demonstration, and Handouts Education comprehension: verbalized understanding, returned demonstration, and needs further education      -------------------------------------------------------------- Objective measures below taken at initial evaluation:  DIAGNOSTIC FINDINGS: 10/02/22 brain MRI: Small acute infarcts  scattered in the bilateral cerebellum (left greater than right). Punctate acute lacunar infarcts in both the left pons and thalamus. And minimal occipital lobe restricted diffusion, perhaps greater on the right.   10/01/22 head/neck angio CT: Short segment occlusion of the proximal left P1 segment, with additional moderate stenosis in the distal left P2 segment. Severe stenosis in the left supraclinoid ICA and moderate stenosis in the right supraclinoid ICA. 4. Severe stenosis at the origin of the right PICA. Mild stenosis in the distal basilar artery.   COGNITION: Overall cognitive status: Within functional limits for tasks assessed   SENSATION: Denies N/T in UEs/LEs   COORDINATION: Alternating pronation/supination: dysmetric L>R Alternating toe tap: dysmetric L>R Finger to nose: dysmetric L>R Heel to shin: intact  B     MUSCLE TONE: WNL but B calf very tight    POSTURE: rounded shoulders  LOWER EXTREMITY ROM:     Active  Right Eval Left Eval  Hip flexion    Hip extension    Hip abduction    Hip adduction    Hip internal rotation    Hip external rotation    Knee flexion    Knee extension    Ankle dorsiflexion 4 *knee bent 8 knee bent  Ankle plantarflexion    Ankle inversion    Ankle eversion     (Blank rows = not tested)   LOWER EXTREMITY MMT:    MMT (in sitting) Right Eval Left Eval  Hip flexion 4 4-  Hip extension    Hip abduction 4 3+  Hip adduction 3+ 3+  Hip internal rotation    Hip external rotation    Knee flexion 4 4  Knee extension 5 5  Ankle dorsiflexion 4+ 4  Ankle plantarflexion 4+ 4+  Ankle inversion    Ankle eversion    (Blank rows = not tested)   GAIT: Gait pattern: Slow, wide BOS, unsteady  Assistive device utilized: None Level of assistance: SBA   FUNCTIONAL TESTS:       PATIENT SURVEYS:  FOTO 47, 55    GOALS: Goals reviewed with patient? Yes  SHORT TERM GOALS: Target date: 11/11/2022  Patient to be  independent with initial HEP. Baseline: HEP initiated Goal status: MET    LONG TERM GOALS: Target date: 11/25/2022>UPDATED TARGET 01/06/2023  Patient to be independent with advanced HEP. Baseline: Not yet initiated, 11/25/2022: initiated, but pt reports not doing regularly Goal status: IN PROGRESS  Patient to demonstrate B ankle DF AROM to at least 10 deg to improve safety with gait.  Baseline: 4 deg, 8 deg with compensations >11/25/2022 met 10 degrees bilaterally Goal status: MET8/12/2022  Patient to demonstrate alternating reciprocal pattern when ascending and descending stairs with good stability and 1 handrail as needed.   Baseline: NT Goal status: MET 11/25/2022  Patient to complete TUG in <14 sec with LRAD in order to decrease risk of falls.   Baseline: 21.38 sec>19.12 sec 11/25/2022 Goal status: IN PROGRESS  Patient to demonstrate 5xSTS test in <15 sec with good control and posture in order to decrease risk of falls.  Baseline: 13.92 sec with limited eccentric control and not standing fully> 16.25 sec with use of UE support today, not standing fully Goal status: IN PROGRESS  Patient to score at least 47/56 on Berg in order to decrease risk of falls.  Baseline: 42>40/56 11/25/2022 Goal status: IN PROGRESS  Patient to score at least 55 on FOTO in order to indicate improved functional outcomes.  Baseline: 47>48 11/25/2022 Goal status: IN PROGRESS   ASSESSMENT:  CLINICAL IMPRESSION: Pt presents to OPPT today after cancelling last visit due to a fall at a restaurant.  He reports it happened just as he got up and fell down, feeling as if he passed out.  He is to go to see cardiologist today.  He denies hitting his head, no additional pain or weakness.  Focused today's skilled PT session on balance and strengthening to hip musculature to improve hip stability for balance.  He does come into session today without cane/walking pole, and is reaching for furniture both coming in and going out of  therapy session.  PT educated patient on importance of using appropriate assistive device for safety with gait, versus furniture walking, especially given  recent fall.  He will continue to benefit from skilled PT to further address balance, and will need to include hip/hamstring strength as part of balance retraining.    OBJECTIVE IMPAIRMENTS: Abnormal gait, decreased balance, decreased coordination, and impaired flexibility.   ACTIVITY LIMITATIONS: carrying, lifting, bending, standing, stairs, transfers, bathing, toileting, dressing, reach over head, hygiene/grooming, and locomotion level  PARTICIPATION LIMITATIONS: meal prep, cleaning, laundry, driving, shopping, community activity, yard work, and church  PERSONAL FACTORS: Age, Behavior pattern, Past/current experiences, Time since onset of injury/illness/exacerbation, and 3+ comorbidities: Anxiety, CAD, depression, DM, HTN, HLD, loop recorder, CABG 2008  are also affecting patient's functional outcome.   REHAB POTENTIAL: Good  CLINICAL DECISION MAKING: Evolving/moderate complexity  EVALUATION COMPLEXITY: Moderate  PLAN:  PT FREQUENCY: 2x/week  PT DURATION: 6 weeks per recert 11/25/2022  PLANNED INTERVENTIONS: Therapeutic exercises, Therapeutic activity, Neuromuscular re-education, Balance training, Gait training, Patient/Family education, Self Care, Joint mobilization, Stair training, Vestibular training, Canalith repositioning, DME instructions, Aquatic Therapy, Dry Needling, Electrical stimulation, Cryotherapy, Moist heat, Taping, Manual therapy, and Re-evaluation  PLAN FOR NEXT SESSION: Goals are due next week Continue to progress HEP (*HAVE NOT done this yet, as pt reports he is not compliant with current HEP); work on SLS, tandem stance and gait, balance activities on compliant surfaces.   Lonia Blood, PT 12/28/22 9:27 AM Phone: [phone] Fax: [phone]  [facility] at [facility] [address] [place], [address] [place], [place] [address] Phone # [phone] Fax # [phone]

## 2022-12-30 ENCOUNTER — Ambulatory Visit: Payer: HMO | Admitting: Physical Therapy

## 2022-12-30 ENCOUNTER — Encounter: Payer: Self-pay | Admitting: Physical Therapy

## 2022-12-30 DIAGNOSIS — R2689 Other abnormalities of gait and mobility: Secondary | ICD-10-CM

## 2022-12-30 DIAGNOSIS — R2681 Unsteadiness on feet: Secondary | ICD-10-CM | POA: Diagnosis not present

## 2022-12-30 NOTE — Therapy (Signed)
OUTPATIENT PHYSICAL THERAPY NEURO TREATMENT/10th Visit PROGRESS NOTE  Patient Name: [name] MRN: [id number] DOB:1934-11-20, 88 y.o., male Today's Date: 12/30/2022   PCP: Cleatis Polka., MD  REFERRING PROVIDER: Horton Chin, MD  Progress Note Reporting Period 10/28/2022 to 12/30/2022  See note below for Objective Data and Assessment of Progress/Goals.      END OF SESSION:  PT End of Session - 12/30/22 0939     Visit Number 10    Number of Visits 16    Date for PT Re-Evaluation 01/06/23    Authorization Type [employer]    Progress Note Due on Visit 10    PT Start Time 0936    PT Stop Time 1016    PT Time Calculation (min) 40 min    Equipment Utilized During Treatment Gait belt    Activity Tolerance Patient tolerated treatment well    Behavior During Therapy WFL for tasks assessed/performed                    Past Medical History:  Diagnosis Date   Acute renal failure (HCC)    secondary to nephrotic syndrome post cath, resolved with steroids   Anxiety    Arthritis    CAD (coronary artery disease) 04/2006   LIMA to LAD, sequential SVG to first, second, and third OM's, sequential SVG to mid RCA and PDA    Depression    Diabetes mellitus without complication (HCC)    ED (erectile dysfunction)    HTN (hypertension)    Hx of adenomatous colonic polyps    Hyperlipidemia    Loop recorder: Biotronic Biomonitor II loop 07/28/2021 07/28/2021   Obesity    Stroke (HCC)    june   Past Surgical History:  Procedure Laterality Date   COLONOSCOPY W/ BIOPSIES AND POLYPECTOMY     CORONARY ARTERY BYPASS GRAFT  2008   VESSELS X6   IR CT HEAD LTD  10/02/2022   IR PERCUTANEOUS ART THROMBECTOMY/INFUSION INTRACRANIAL INC DIAG ANGIO  10/02/2022   IR US GUIDE VASC ACCESS RIGHT  10/02/2022   LOOP RECORDER IMPLANT     RADIOLOGY WITH ANESTHESIA N/A 10/02/2022   Procedure: IR WITH ANESTHESIA;  Surgeon: Radiologist, Medication, MD;  Location: [facility] OR;  Service:  Radiology;  Laterality: N/A;   UMBILICAL HERNIA REPAIR     Patient Active Problem List   Diagnosis Date Noted   Ischemic cerebrovascular accident (CVA) (HCC) 10/06/2022   Status post surgery 10/02/2022   Stroke: Multiple scattered bilateral ischemic infarcts s/p thrombectomy of left P1/PCA TICI 3 Etiology: Likely cardioembolic 10/02/2022   Hemianopsia 10/01/2022   Right homonymous hemianopsia 10/01/2022   Loop recorder: Biotronic Biomonitor II loop 07/28/2021 07/28/2021   Unsteady gait 08/05/2020   BPH with obstruction/lower urinary tract symptoms 01/21/2020   Anxiety disorder 02/02/2018   Right hip pain 10/02/2017   Recurrent major depression in remission (HCC) 01/27/2017   Chronic kidney disease, stage 3a (HCC) 07/17/2015   Personal history of colonic adenomas 03/26/2013   Obesity 10/04/2012   OBSTRUCTIVE SLEEP APNEA 10/30/2008   HYPERLIPIDEMIA-MIXED 07/01/2008   HYPERTENSION, BENIGN 07/01/2008   CAD, NATIVE VESSEL 07/01/2008   FATIGUE / MALAISE 07/01/2008   EDEMA 07/01/2008    ONSET DATE: 10/02/22  REFERRING DIAG: I63.9 (ICD-10-CM) - Cerebral infarction, unspecified  THERAPY DIAG:  Unsteadiness on feet  Other abnormalities of gait and mobility  Rationale for Evaluation and Treatment: Rehabilitation  SUBJECTIVE:  SUBJECTIVE STATEMENT: Saw the cardiologist and he said I was in great shape.  Feel like therapy is helping.    Pt accompanied by: self  PERTINENT HISTORY: Anxiety, CAD, depression, DM, HTN, HLD, loop recorder, CABG 2008  PAIN:  Are you having pain? No   PRECAUTIONS: Fall, ICD/Pacemaker, and Other: loop recorder .  Pt reports 11/25/2022 visit that he has wound on buttocks from "when in the hospital"  RED FLAGS: None   WEIGHT BEARING RESTRICTIONS: No  FALLS: Has patient  fallen in last 6 months? Yes. Number of falls 1 son reports syncopal episode caused the fall  LIVING ENVIRONMENT: Lives with: lives alone Lives in: House/apartment Stairs:  2 steps to enter without handrail; 2nd story home but stays on 1st floor Has following equipment at home: Dan Humphreys - 2 wheeled and walking stick  PLOF: Independent; has cleaning services   PATIENT GOALS: improve balance   OBJECTIVE:    TODAY'S TREATMENT: 12/30/2022 Activity Comments  TUG:  15.15 sec 14.07 sec 15.34 Improved from 21 sec  FTSTS:  16.47 sec   Berg:  42/56  Improved from 40/56  Pt performs full HEP-see below With addition of head turns/nods with EC feet together  Heel/toe raises 2 x 10 3 sec hold  Gait with single walking pole  Cues for sequence          Access Code: [email or web address] URL: [email or web address] Date: 12/05/2022 Prepared by: [facility] - Outpatient  Rehab - [facility]  Exercises - Narrow Stance with Counter Support  - 1 x daily - 5 x weekly - 2 sets - 30 sec hold - Romberg Stance with Eyes Closed  - 1 x daily - 5 x weekly - 2 sets - 30 sec hold - Tandem Walking with Counter Support  - 1 x daily - 5 x weekly - 2 sets - 10 reps - Forward Step Touch  - 1 x daily - 7 x weekly - 2 sets - 10 reps - Single Leg Stance with Support  - 1 x daily - 7 x weekly - 1 sets - 3 reps - 10 sec hold    PATIENT EDUCATION: Education details: Progress towards goals, POC Person educated: Patient Education method: Explanation, Demonstration, and Handouts Education comprehension: verbalized understanding, returned demonstration, and needs further education      -------------------------------------------------------------- Objective measures below taken at initial evaluation:  DIAGNOSTIC FINDINGS: 10/02/22 brain MRI: Small acute infarcts scattered in the bilateral cerebellum (left greater than right). Punctate acute lacunar infarcts in both the left pons and thalamus. And  minimal occipital lobe restricted diffusion, perhaps greater on the right.   10/01/22 head/neck angio CT: Short segment occlusion of the proximal left P1 segment, with additional moderate stenosis in the distal left P2 segment. Severe stenosis in the left supraclinoid ICA and moderate stenosis in the right supraclinoid ICA. 4. Severe stenosis at the origin of the right PICA. Mild stenosis in the distal basilar artery.   COGNITION: Overall cognitive status: Within functional limits for tasks assessed   SENSATION: Denies N/T in UEs/LEs   COORDINATION: Alternating pronation/supination: dysmetric L>R Alternating toe tap: dysmetric L>R Finger to nose: dysmetric L>R Heel to shin: intact B     MUSCLE TONE: WNL but B calf very tight    POSTURE: rounded shoulders  LOWER EXTREMITY ROM:     Active  Right Eval Left Eval  Hip flexion    Hip extension    Hip abduction    Hip adduction    Hip  internal rotation    Hip external rotation    Knee flexion    Knee extension    Ankle dorsiflexion 4 *knee bent 8 knee bent  Ankle plantarflexion    Ankle inversion    Ankle eversion     (Blank rows = not tested)   LOWER EXTREMITY MMT:    MMT (in sitting) Right Eval Left Eval  Hip flexion 4 4-  Hip extension    Hip abduction 4 3+  Hip adduction 3+ 3+  Hip internal rotation    Hip external rotation    Knee flexion 4 4  Knee extension 5 5  Ankle dorsiflexion 4+ 4  Ankle plantarflexion 4+ 4+  Ankle inversion    Ankle eversion    (Blank rows = not tested)   GAIT: Gait pattern: Slow, wide BOS, unsteady  Assistive device utilized: None Level of assistance: SBA   FUNCTIONAL TESTS:   [facility] PT Assessment - 12/30/22 0946       Standardized Balance Assessment   Standardized Balance Assessment Berg Balance Test      Berg Balance Test   Sit to Stand Able to stand  independently using hands    Standing Unsupported Able to stand safely 2 minutes    Sitting with Back  Unsupported but Feet Supported on Floor or Stool Able to sit safely and securely 2 minutes    Stand to Sit Controls descent by using hands    Transfers Able to transfer safely, definite need of hands    Standing Unsupported with Eyes Closed Able to stand 10 seconds safely    Standing Unsupported with Feet Together Able to place feet together independently and stand 1 minute safely    From Standing, Reach Forward with Outstretched Arm Can reach confidently >25 cm (10")    From Standing Position, Pick up Object from Floor Able to pick up shoe, needs supervision    From Standing Position, Turn to Look Behind Over each Shoulder Looks behind from both sides and weight shifts well    Turn 360 Degrees Able to turn 360 degrees safely but slowly    Standing Unsupported, Alternately Place Feet on Step/Stool Able to complete >2 steps/needs minimal assist    Standing Unsupported, One Foot in Front Able to plae foot ahead of the other independently and hold 30 seconds    Standing on One Leg Unable to try or needs assist to prevent fall    Total Score 42                PATIENT SURVEYS:  FOTO 47, 55    GOALS: Goals reviewed with patient? Yes  SHORT TERM GOALS: Target date: 11/11/2022  Patient to be independent with initial HEP. Baseline: HEP initiated Goal status: MET    LONG TERM GOALS: Target date: 11/25/2022>UPDATED TARGET 01/06/2023  Patient to be independent with advanced HEP. Baseline: Not yet initiated, 11/25/2022: initiated, but pt reports not doing regularly Goal status: IN PROGRESS  Patient to demonstrate B ankle DF AROM to at least 10 deg to improve safety with gait.  Baseline: 4 deg, 8 deg with compensations >11/25/2022 met 10 degrees bilaterally Goal status: MET8/12/2022  Patient to demonstrate alternating reciprocal pattern when ascending and descending stairs with good stability and 1 handrail as needed.   Baseline: NT Goal status: MET 11/25/2022  Patient to complete TUG in  <14 sec with LRAD in order to decrease risk of falls.   Baseline: 21.38 sec>19.12 sec 11/25/2022>14.07 sec at best 12/30/2022 Goal  status: MET  Patient to demonstrate 5xSTS test in <15 sec with good control and posture in order to decrease risk of falls.  Baseline: 13.92 sec with limited eccentric control and not standing fully> 16.25 sec with use of UE support today, not standing fully> 16.47 sec 12/30/2022 Goal status: IN PROGRESS  Patient to score at least 47/56 on Berg in order to decrease risk of falls.  Baseline: 42>40/56 11/25/2022> 42/56 12/30/2022 Goal status: NOT MET 12/30/2022  Patient to score at least 55 on FOTO in order to indicate improved functional outcomes.  Baseline: 47>48 11/25/2022 Goal status: IN PROGRESS   ASSESSMENT:  CLINICAL IMPRESSION: 10th Visit PN:  Pt reports he thinks that PT is helping, and he brings in his walking pole today.  Objective measures taken-see above.  Pt has demonstrated improvement in TUG score and Berg, with Berg returning to baseline score of 42/56.  He has met LTG 2, 3, 4.  He continues to have wide BOS and ataxic type gait pattern; he does continue to report he is not doing exercises at home.  He is still at increased fall risk and PT instructs pt to continue to use walking pole for stability with gait.  Likely will continue through POC next week and plan for discharge at that time.  OBJECTIVE IMPAIRMENTS: Abnormal gait, decreased balance, decreased coordination, and impaired flexibility.   ACTIVITY LIMITATIONS: carrying, lifting, bending, standing, stairs, transfers, bathing, toileting, dressing, reach over head, hygiene/grooming, and locomotion level  PARTICIPATION LIMITATIONS: meal prep, cleaning, laundry, driving, shopping, community activity, yard work, and church  PERSONAL FACTORS: Age, Behavior pattern, Past/current experiences, Time since onset of injury/illness/exacerbation, and 3+ comorbidities: Anxiety, CAD, depression, DM, HTN, HLD, loop  recorder, CABG 2008  are also affecting patient's functional outcome.   REHAB POTENTIAL: Good  CLINICAL DECISION MAKING: Evolving/moderate complexity  EVALUATION COMPLEXITY: Moderate  PLAN:  PT FREQUENCY: 2x/week  PT DURATION: 6 weeks per recert 11/25/2022  PLANNED INTERVENTIONS: Therapeutic exercises, Therapeutic activity, Neuromuscular re-education, Balance training, Gait training, Patient/Family education, Self Care, Joint mobilization, Stair training, Vestibular training, Canalith repositioning, DME instructions, Aquatic Therapy, Dry Needling, Electrical stimulation, Cryotherapy, Moist heat, Taping, Manual therapy, and Re-evaluation  PLAN FOR NEXT SESSION: FOTO; and check LTGs.   Add hee/toe raises to HEP.  Work on [employer], tandem stance and gait, balance activities on compliant surfaces.   Lonia Blood, PT 12/30/22 12:26 PM Phone: [phone] Fax: [phone]  [facility] Out[facility] Re[facility] at [facility] [address] [place], [address] [place], [place] [address] Phone # [phone] Fax # [phone]

## 2023-01-02 ENCOUNTER — Ambulatory Visit: Payer: HMO | Admitting: Physical Therapy

## 2023-01-02 ENCOUNTER — Encounter: Payer: Self-pay | Admitting: Physical Therapy

## 2023-01-02 DIAGNOSIS — R2689 Other abnormalities of gait and mobility: Secondary | ICD-10-CM

## 2023-01-02 DIAGNOSIS — R2681 Unsteadiness on feet: Secondary | ICD-10-CM

## 2023-01-02 NOTE — Therapy (Signed)
OUTPATIENT PHYSICAL THERAPY NEURO TREATMENT  Patient Name: [name] MRN: [id number] DOB:1934-12-16, 88 y.o., male Today's Date: 01/02/2023   PCP: Cleatis Polka., MD  REFERRING PROVIDER: Horton Chin, MD      END OF SESSION:  PT End of Session - 01/02/23 0924     Visit Number 11    Number of Visits 16    Date for PT Re-Evaluation 01/06/23    Authorization Type [employer]    Progress Note Due on Visit 10    PT Start Time 0928    PT Stop Time 1014    PT Time Calculation (min) 46 min    Equipment Utilized During Treatment --    Activity Tolerance Patient tolerated treatment well    Behavior During Therapy [facility] for tasks assessed/performed                     Past Medical History:  Diagnosis Date   Acute renal failure (HCC)    secondary to nephrotic syndrome post cath, resolved with steroids   Anxiety    Arthritis    CAD (coronary artery disease) 04/2006   LIMA to LAD, sequential SVG to first, second, and third OM's, sequential SVG to mid RCA and PDA    Depression    Diabetes mellitus without complication (HCC)    ED (erectile dysfunction)    HTN (hypertension)    Hx of adenomatous colonic polyps    Hyperlipidemia    Loop recorder: Biotronic Biomonitor II loop 07/28/2021 07/28/2021   Obesity    Stroke (HCC)    june   Past Surgical History:  Procedure Laterality Date   COLONOSCOPY W/ BIOPSIES AND POLYPECTOMY     CORONARY ARTERY BYPASS GRAFT  2008   VESSELS X6   IR CT HEAD LTD  10/02/2022   IR PERCUTANEOUS ART THROMBECTOMY/INFUSION INTRACRANIAL INC DIAG ANGIO  10/02/2022   IR US GUIDE VASC ACCESS RIGHT  10/02/2022   LOOP RECORDER IMPLANT     RADIOLOGY WITH ANESTHESIA N/A 10/02/2022   Procedure: IR WITH ANESTHESIA;  Surgeon: Radiologist, Medication, MD;  Location: [facility] OR;  Service: Radiology;  Laterality: N/A;   UMBILICAL HERNIA REPAIR     Patient Active Problem List   Diagnosis Date Noted   Ischemic cerebrovascular accident (CVA)  (HCC) 10/06/2022   Status post surgery 10/02/2022   Stroke: Multiple scattered bilateral ischemic infarcts s/p thrombectomy of left P1/PCA TICI 3 Etiology: Likely cardioembolic 10/02/2022   Hemianopsia 10/01/2022   Right homonymous hemianopsia 10/01/2022   Loop recorder: Biotronic Biomonitor II loop 07/28/2021 07/28/2021   Unsteady gait 08/05/2020   BPH with obstruction/lower urinary tract symptoms 01/21/2020   Anxiety disorder 02/02/2018   Right hip pain 10/02/2017   Recurrent major depression in remission (HCC) 01/27/2017   Chronic kidney disease, stage 3a (HCC) 07/17/2015   Personal history of colonic adenomas 03/26/2013   Obesity 10/04/2012   OBSTRUCTIVE SLEEP APNEA 10/30/2008   HYPERLIPIDEMIA-MIXED 07/01/2008   HYPERTENSION, BENIGN 07/01/2008   CAD, NATIVE VESSEL 07/01/2008   FATIGUE / MALAISE 07/01/2008   EDEMA 07/01/2008    ONSET DATE: 10/02/22  REFERRING DIAG: I63.9 (ICD-10-CM) - Cerebral infarction, unspecified  THERAPY DIAG:  Unsteadiness on feet  Other abnormalities of gait and mobility  Rationale for Evaluation and Treatment: Rehabilitation  SUBJECTIVE:  SUBJECTIVE STATEMENT: "Didn't do nothing" over the weekend.      Pt accompanied by: self  PERTINENT HISTORY: Anxiety, CAD, depression, DM, HTN, HLD, loop recorder, CABG 2008  PAIN:  Are you having pain? Yes: NPRS scale: slight/10 Pain location: L hip Pain description: sore Aggravating factors: unsure Relieving factors: unsure    PRECAUTIONS: Fall, ICD/Pacemaker, and Other: loop recorder .  Pt reports 11/25/2022 visit that he has wound on buttocks from "when in the hospital"  RED FLAGS: None   WEIGHT BEARING RESTRICTIONS: No  FALLS: Has patient fallen in last 6 months? Yes. Number of falls 1 son reports syncopal episode  caused the fall  LIVING ENVIRONMENT: Lives with: lives alone Lives in: House/apartment Stairs:  2 steps to enter without handrail; 2nd story home but stays on 1st floor Has following equipment at home: Dan Humphreys - 2 wheeled and walking stick  PLOF: Independent; has cleaning services   PATIENT GOALS: improve balance   OBJECTIVE:    TODAY'S TREATMENT: 01/02/2023 Activity Comments  Forward/back walking Sidestepping Tandem walk forward/back X 2 minutes each  Heel/toe raises 2 x 10  3 sec  Stagger stance forward/back rock 10 reps   Standing on incline/decline: -forward/back step and weightshift x 10 -EO head turns/nods 5 reps -EC head steady 30 sec   Forward step ups 10 reps each leg , side step ups 10 reps each BUE support  Gait with walking pole, 85 ft x 3 reps, supervision Cues for heelstrike RLE    Access Code: [email or web address] URL: [email or web address] Date: 01/02/2023 Prepared by: [facility] - Outpatient  Rehab - [facility]  Exercises - Narrow Stance with Counter Support  - 1 x daily - 5 x weekly - 2 sets - 30 sec hold - Romberg Stance with Eyes Closed  - 1 x daily - 5 x weekly - 2 sets - 30 sec hold - Tandem Walking with Counter Support  - 1 x daily - 5 x weekly - 2 sets - 10 reps - Forward Step Touch  - 1 x daily - 7 x weekly - 2 sets - 10 reps - Single Leg Stance with Support  - 1 x daily - 7 x weekly - 1 sets - 3 reps - 10 sec hold - Heel Toe Raises with Counter Support  - 1 x daily - 7 x weekly - 3 sets - 10 reps - 3 sec hold         PATIENT EDUCATION: Education details: Addition to HEP Person educated: Patient Education method: Explanation, Demonstration, and Handouts Education comprehension: verbalized understanding, returned demonstration, and needs further education      -------------------------------------------------------------- Objective measures below taken at initial evaluation:  DIAGNOSTIC FINDINGS: 10/02/22 brain MRI: Small acute  infarcts scattered in the bilateral cerebellum (left greater than right). Punctate acute lacunar infarcts in both the left pons and thalamus. And minimal occipital lobe restricted diffusion, perhaps greater on the right.   10/01/22 head/neck angio CT: Short segment occlusion of the proximal left P1 segment, with additional moderate stenosis in the distal left P2 segment. Severe stenosis in the left supraclinoid ICA and moderate stenosis in the right supraclinoid ICA. 4. Severe stenosis at the origin of the right PICA. Mild stenosis in the distal basilar artery.   COGNITION: Overall cognitive status: Within functional limits for tasks assessed   SENSATION: Denies N/T in UEs/LEs   COORDINATION: Alternating pronation/supination: dysmetric L>R Alternating toe tap: dysmetric L>R Finger to nose: dysmetric L>R Heel to shin: intact B  MUSCLE TONE: WNL but B calf very tight    POSTURE: rounded shoulders  LOWER EXTREMITY ROM:     Active  Right Eval Left Eval  Hip flexion    Hip extension    Hip abduction    Hip adduction    Hip internal rotation    Hip external rotation    Knee flexion    Knee extension    Ankle dorsiflexion 4 *knee bent 8 knee bent  Ankle plantarflexion    Ankle inversion    Ankle eversion     (Blank rows = not tested)   LOWER EXTREMITY MMT:    MMT (in sitting) Right Eval Left Eval  Hip flexion 4 4-  Hip extension    Hip abduction 4 3+  Hip adduction 3+ 3+  Hip internal rotation    Hip external rotation    Knee flexion 4 4  Knee extension 5 5  Ankle dorsiflexion 4+ 4  Ankle plantarflexion 4+ 4+  Ankle inversion    Ankle eversion    (Blank rows = not tested)   GAIT: Gait pattern: Slow, wide BOS, unsteady  Assistive device utilized: None Level of assistance: SBA   FUNCTIONAL TESTS:        PATIENT SURVEYS:  FOTO 47, 55    GOALS: Goals reviewed with patient? Yes  SHORT TERM GOALS: Target date: 11/11/2022  Patient to  be independent with initial HEP. Baseline: HEP initiated Goal status: MET    LONG TERM GOALS: Target date: 11/25/2022>UPDATED TARGET 01/06/2023  Patient to be independent with advanced HEP. Baseline: Not yet initiated, 11/25/2022: initiated, but pt reports not doing regularly Goal status: IN PROGRESS  Patient to demonstrate B ankle DF AROM to at least 10 deg to improve safety with gait.  Baseline: 4 deg, 8 deg with compensations >11/25/2022 met 10 degrees bilaterally Goal status: MET8/12/2022  Patient to demonstrate alternating reciprocal pattern when ascending and descending stairs with good stability and 1 handrail as needed.   Baseline: NT Goal status: MET 11/25/2022  Patient to complete TUG in <14 sec with LRAD in order to decrease risk of falls.   Baseline: 21.38 sec>19.12 sec 11/25/2022>14.07 sec at best 12/30/2022 Goal status: MET  Patient to demonstrate 5xSTS test in <15 sec with good control and posture in order to decrease risk of falls.  Baseline: 13.92 sec with limited eccentric control and not standing fully> 16.25 sec with use of UE support today, not standing fully> 16.47 sec 12/30/2022 Goal status: IN PROGRESS  Patient to score at least 47/56 on Berg in order to decrease risk of falls.  Baseline: 42>40/56 11/25/2022> 42/56 12/30/2022 Goal status: NOT MET 12/30/2022  Patient to score at least 55 on FOTO in order to indicate improved functional outcomes.  Baseline: 47>48 11/25/2022 Goal status: IN PROGRESS   ASSESSMENT:  CLINICAL IMPRESSION: Skilled PT session focused today on balance exercises, and added to HEP for heel/toe raises.  With exercises today, he needs UE support, 1-2 UE support for optimal stability.  Worked also on single limb stance with step ups for improved SLS, with UE support needed.  Likely will continue through POC next visit and plan for discharge at that time.  OBJECTIVE IMPAIRMENTS: Abnormal gait, decreased balance, decreased coordination, and impaired  flexibility.   ACTIVITY LIMITATIONS: carrying, lifting, bending, standing, stairs, transfers, bathing, toileting, dressing, reach over head, hygiene/grooming, and locomotion level  PARTICIPATION LIMITATIONS: meal prep, cleaning, laundry, driving, shopping, community activity, yard work, and church  PERSONAL FACTORS: Age, Behavior  pattern, Past/current experiences, Time since onset of injury/illness/exacerbation, and 3+ comorbidities: Anxiety, CAD, depression, DM, HTN, HLD, loop recorder, CABG 2008  are also affecting patient's functional outcome.   REHAB POTENTIAL: Good  CLINICAL DECISION MAKING: Evolving/moderate complexity  EVALUATION COMPLEXITY: Moderate  PLAN:  PT FREQUENCY: 2x/week  PT DURATION: 6 weeks per recert 11/25/2022  PLANNED INTERVENTIONS: Therapeutic exercises, Therapeutic activity, Neuromuscular re-education, Balance training, Gait training, Patient/Family education, Self Care, Joint mobilization, Stair training, Vestibular training, Canalith repositioning, DME instructions, Aquatic Therapy, Dry Needling, Electrical stimulation, Cryotherapy, Moist heat, Taping, Manual therapy, and Re-evaluation  PLAN FOR NEXT SESSION: Check FOTO and check LTGs and plan for d/c.   Add hee/toe raises to HEP.  Work on [employer], tandem stance and gait, balance activities on compliant surfaces.   Lonia Blood, PT 01/02/23 10:18 AM Phone: [phone] Fax: [phone]  [facility] Re[facility] at [facility] [address] [place], [address] [place], [place] [address] Phone # [phone] Fax # [phone]

## 2023-01-05 ENCOUNTER — Ambulatory Visit: Payer: HMO | Admitting: Physical Therapy

## 2023-01-05 DIAGNOSIS — R293 Abnormal posture: Secondary | ICD-10-CM | POA: Diagnosis not present

## 2023-01-05 DIAGNOSIS — R29898 Other symptoms and signs involving the musculoskeletal system: Secondary | ICD-10-CM | POA: Diagnosis not present

## 2023-01-05 DIAGNOSIS — M542 Cervicalgia: Secondary | ICD-10-CM | POA: Diagnosis not present

## 2023-01-05 DIAGNOSIS — G464 Cerebellar stroke syndrome: Secondary | ICD-10-CM | POA: Diagnosis not present

## 2023-01-09 ENCOUNTER — Ambulatory Visit: Payer: HMO | Admitting: Physical Therapy

## 2023-01-11 ENCOUNTER — Other Ambulatory Visit: Payer: Self-pay

## 2023-01-11 NOTE — Patient Outreach (Signed)
First telephone outreach attempt to obtain mRS. No answer. Left message for returned call.  [facility] Management Assistant [phone]

## 2023-01-16 ENCOUNTER — Other Ambulatory Visit: Payer: Self-pay

## 2023-01-16 NOTE — Patient Outreach (Signed)
Second telephone outreach attempt to obtain mRS. No answer. Left message for returned call.  [name] [facility] Management Assistant [phone]  

## 2023-01-17 ENCOUNTER — Ambulatory Visit: Payer: HMO | Attending: Physical Medicine and Rehabilitation | Admitting: Physical Therapy

## 2023-01-17 DIAGNOSIS — R2689 Other abnormalities of gait and mobility: Secondary | ICD-10-CM | POA: Insufficient documentation

## 2023-01-17 DIAGNOSIS — Z23 Encounter for immunization: Secondary | ICD-10-CM | POA: Diagnosis not present

## 2023-01-17 DIAGNOSIS — Z794 Long term (current) use of insulin: Secondary | ICD-10-CM | POA: Diagnosis not present

## 2023-01-17 DIAGNOSIS — E1129 Type 2 diabetes mellitus with other diabetic kidney complication: Secondary | ICD-10-CM | POA: Diagnosis not present

## 2023-01-17 DIAGNOSIS — I25810 Atherosclerosis of coronary artery bypass graft(s) without angina pectoris: Secondary | ICD-10-CM | POA: Diagnosis not present

## 2023-01-17 DIAGNOSIS — N1831 Chronic kidney disease, stage 3a: Secondary | ICD-10-CM | POA: Diagnosis not present

## 2023-01-17 DIAGNOSIS — I129 Hypertensive chronic kidney disease with stage 1 through stage 4 chronic kidney disease, or unspecified chronic kidney disease: Secondary | ICD-10-CM | POA: Diagnosis not present

## 2023-01-17 DIAGNOSIS — R2681 Unsteadiness on feet: Secondary | ICD-10-CM | POA: Insufficient documentation

## 2023-01-18 ENCOUNTER — Other Ambulatory Visit: Payer: Self-pay

## 2023-01-18 DIAGNOSIS — R293 Abnormal posture: Secondary | ICD-10-CM | POA: Diagnosis not present

## 2023-01-18 DIAGNOSIS — R29898 Other symptoms and signs involving the musculoskeletal system: Secondary | ICD-10-CM | POA: Diagnosis not present

## 2023-01-18 DIAGNOSIS — M542 Cervicalgia: Secondary | ICD-10-CM | POA: Diagnosis not present

## 2023-01-18 DIAGNOSIS — G464 Cerebellar stroke syndrome: Secondary | ICD-10-CM | POA: Diagnosis not present

## 2023-01-18 NOTE — Patient Outreach (Signed)
3 outreach attempts were completed to obtain mRs. mRs could not be obtained because patient never returned my calls. mRs=7    [place] Management Assistant [phone]

## 2023-01-20 DIAGNOSIS — G464 Cerebellar stroke syndrome: Secondary | ICD-10-CM | POA: Diagnosis not present

## 2023-01-20 DIAGNOSIS — R29898 Other symptoms and signs involving the musculoskeletal system: Secondary | ICD-10-CM | POA: Diagnosis not present

## 2023-01-20 DIAGNOSIS — M542 Cervicalgia: Secondary | ICD-10-CM | POA: Diagnosis not present

## 2023-01-20 DIAGNOSIS — R293 Abnormal posture: Secondary | ICD-10-CM | POA: Diagnosis not present

## 2023-01-23 ENCOUNTER — Telehealth: Payer: Self-pay

## 2023-01-23 ENCOUNTER — Ambulatory Visit: Payer: HMO | Admitting: Cardiology

## 2023-01-23 DIAGNOSIS — G464 Cerebellar stroke syndrome: Secondary | ICD-10-CM | POA: Diagnosis not present

## 2023-01-23 DIAGNOSIS — R29898 Other symptoms and signs involving the musculoskeletal system: Secondary | ICD-10-CM | POA: Diagnosis not present

## 2023-01-23 DIAGNOSIS — M542 Cervicalgia: Secondary | ICD-10-CM | POA: Diagnosis not present

## 2023-01-23 DIAGNOSIS — R55 Syncope and collapse: Secondary | ICD-10-CM | POA: Diagnosis not present

## 2023-01-23 DIAGNOSIS — R293 Abnormal posture: Secondary | ICD-10-CM | POA: Diagnosis not present

## 2023-01-23 DIAGNOSIS — Z4509 Encounter for adjustment and management of other cardiac device: Secondary | ICD-10-CM | POA: Diagnosis not present

## 2023-01-23 NOTE — Telephone Encounter (Signed)
Discussed alerts for HVR episodes with Dr. Jacinto Halim.  Per Dr. Mare Ferrari asymptomatic.  (Neighbor)  No changes advised.  Will plan to program this alert off at next office visit.

## 2023-01-25 ENCOUNTER — Ambulatory Visit: Payer: HMO | Admitting: Physical Therapy

## 2023-01-25 ENCOUNTER — Encounter: Payer: Self-pay | Admitting: Physical Therapy

## 2023-01-25 DIAGNOSIS — R2681 Unsteadiness on feet: Secondary | ICD-10-CM

## 2023-01-25 DIAGNOSIS — R29898 Other symptoms and signs involving the musculoskeletal system: Secondary | ICD-10-CM | POA: Diagnosis not present

## 2023-01-25 DIAGNOSIS — M542 Cervicalgia: Secondary | ICD-10-CM | POA: Diagnosis not present

## 2023-01-25 DIAGNOSIS — G464 Cerebellar stroke syndrome: Secondary | ICD-10-CM | POA: Diagnosis not present

## 2023-01-25 DIAGNOSIS — R2689 Other abnormalities of gait and mobility: Secondary | ICD-10-CM | POA: Diagnosis not present

## 2023-01-25 DIAGNOSIS — R293 Abnormal posture: Secondary | ICD-10-CM | POA: Diagnosis not present

## 2023-01-25 NOTE — Therapy (Signed)
OUTPATIENT PHYSICAL THERAPY NEURO TREATMENT/RECERT/DISCHARGE SUMMARY  Patient Name: [name] MRN: [id number] DOB:04-30-1934, 88 y.o., male Today's Date: 01/25/2023   PCP: Cleatis Polka., MD  REFERRING PROVIDER: Horton Chin, MD  PHYSICAL THERAPY DISCHARGE SUMMARY  Visits from Start of Care: 12  Current functional level related to goals / functional outcomes: See below   Remaining deficits: Balance, strength   Education / Equipment: Educated in [place], use of walking pole/walker as needed for balance   Patient agrees to discharge. Patient goals were partially met. Patient is being discharged due to  maximized rehab potential at this time.     END OF SESSION:  PT End of Session - 01/25/23 1346     Visit Number 12    Number of Visits 16    Date for PT Re-Evaluation 01/25/23    Authorization Type [employer]    PT Start Time 1350    PT Stop Time 1428    PT Time Calculation (min) 38 min    Activity Tolerance Patient tolerated treatment well    Behavior During Therapy WFL for tasks assessed/performed                      Past Medical History:  Diagnosis Date   Acute renal failure (HCC)    secondary to nephrotic syndrome post cath, resolved with steroids   Anxiety    Arthritis    CAD (coronary artery disease) 04/2006   LIMA to LAD, sequential SVG to first, second, and third OM's, sequential SVG to mid RCA and PDA    Depression    Diabetes mellitus without complication (HCC)    ED (erectile dysfunction)    HTN (hypertension)    Hx of adenomatous colonic polyps    Hyperlipidemia    Loop recorder: Biotronic Biomonitor II loop 07/28/2021 07/28/2021   Obesity    Stroke (HCC)    june   Past Surgical History:  Procedure Laterality Date   COLONOSCOPY W/ BIOPSIES AND POLYPECTOMY     CORONARY ARTERY BYPASS GRAFT  2008   VESSELS X6   IR CT HEAD LTD  10/02/2022   IR PERCUTANEOUS ART THROMBECTOMY/INFUSION INTRACRANIAL INC DIAG ANGIO   10/02/2022   IR US GUIDE VASC ACCESS RIGHT  10/02/2022   LOOP RECORDER IMPLANT     RADIOLOGY WITH ANESTHESIA N/A 10/02/2022   Procedure: IR WITH ANESTHESIA;  Surgeon: Radiologist, Medication, MD;  Location: [facility] OR;  Service: Radiology;  Laterality: N/A;   UMBILICAL HERNIA REPAIR     Patient Active Problem List   Diagnosis Date Noted   Ischemic cerebrovascular accident (CVA) (HCC) 10/06/2022   Status post surgery 10/02/2022   Stroke: Multiple scattered bilateral ischemic infarcts s/p thrombectomy of left P1/PCA TICI 3 Etiology: Likely cardioembolic 10/02/2022   Hemianopsia 10/01/2022   Right homonymous hemianopsia 10/01/2022   Loop recorder: Biotronic Biomonitor II loop 07/28/2021 07/28/2021   Unsteady gait 08/05/2020   BPH with obstruction/lower urinary tract symptoms 01/21/2020   Anxiety disorder 02/02/2018   Right hip pain 10/02/2017   Recurrent major depression in remission (HCC) 01/27/2017   Chronic kidney disease, stage 3a (HCC) 07/17/2015   History of colonic polyps 03/26/2013   Obesity 10/04/2012   OBSTRUCTIVE SLEEP APNEA 10/30/2008   HYPERLIPIDEMIA-MIXED 07/01/2008   HYPERTENSION, BENIGN 07/01/2008   CAD, NATIVE VESSEL 07/01/2008   FATIGUE / MALAISE 07/01/2008   EDEMA 07/01/2008    ONSET DATE: 10/02/22  REFERRING DIAG: I63.9 (ICD-10-CM) - Cerebral infarction, unspecified  THERAPY DIAG:  Unsteadiness on feet  Other abnormalities of gait and mobility  Rationale for Evaluation and Treatment: Rehabilitation  SUBJECTIVE:                                                                                                                                                                                             SUBJECTIVE STATEMENT: Fell down and hit the floor.  Feel like today isn't a good day.    Pt describes that he is having some memory issues, especially since the stroke.  Pt accompanied by: self  PERTINENT HISTORY: Anxiety, CAD, depression, DM, HTN, HLD, loop recorder,  CABG 2008  PAIN:  Are you having pain? Yes: NPRS scale: 4/10 Pain location: R shoulder pain Pain description: sore Aggravating factors: unsure Relieving factors: unsure    PRECAUTIONS: Fall, ICD/Pacemaker, and Other: loop recorder .  Pt reports 11/25/2022 visit that he has wound on buttocks from "when in the hospital"  RED FLAGS: None   WEIGHT BEARING RESTRICTIONS: No  FALLS: Has patient fallen in last 6 months? Yes. Number of falls 1 son reports syncopal episode caused the fall  LIVING ENVIRONMENT: Lives with: lives alone Lives in: House/apartment Stairs:  2 steps to enter without handrail; 2nd story home but stays on 1st floor Has following equipment at home: Dan Humphreys - 2 wheeled and walking stick  PLOF: Independent; has cleaning services   PATIENT GOALS: improve balance   OBJECTIVE:    TODAY'S TREATMENT: 01/17/2023 Activity Comments  FTSTS:  16.09 sec with UE support Unable to perform without UE support  TUG:  16.50   FOTO:  51 Improved from 47  Gait velocity:  16.4 sec (2 ft/sec)   Reviewed and updated HEP-see below Performed at counter      Access Code: [email or web address] URL: [email or web address] Date: 01/25/2023 Prepared by: [facility] - Outpatient  Rehab - [facility]  Exercises - Tandem Walking with Counter Support  - 1 x daily - 4 x weekly - 2 sets - 10 reps - Forward Step Touch  - 1 x daily - 4 x weekly - 2 sets - 10 reps - Single Leg Stance with Support  - 1 x daily - 4 x weekly - 1 sets - 3 reps - 10 sec hold - Heel Toe Raises with Counter Support  - 1 x daily - 7 x weekly - 3 sets - 10 reps - 3 sec hold - Side Stepping with Counter Support  - 1 x daily - 5 x weekly - 1 sets - 5 reps      PATIENT EDUCATION: Education details: HEP revision/updates; POC and progress towards goals.  Discussed speech therapy as option in regards to pt's mentioned concerns about memory, word finding since CVA.  Discussed safety with gait-using walker on days his  balance is not as good, using walking pole all other times to always have assistive device Person educated: Patient Education method: Explanation, Demonstration, and Handouts Education comprehension: verbalized understanding, returned demonstration, and needs further education      -------------------------------------------------------------- Objective measures below taken at initial evaluation:  DIAGNOSTIC FINDINGS: 10/02/22 brain MRI: Small acute infarcts scattered in the bilateral cerebellum (left greater than right). Punctate acute lacunar infarcts in both the left pons and thalamus. And minimal occipital lobe restricted diffusion, perhaps greater on the right.   10/01/22 head/neck angio CT: Short segment occlusion of the proximal left P1 segment, with additional moderate stenosis in the distal left P2 segment. Severe stenosis in the left supraclinoid ICA and moderate stenosis in the right supraclinoid ICA. 4. Severe stenosis at the origin of the right PICA. Mild stenosis in the distal basilar artery.   COGNITION: Overall cognitive status: Within functional limits for tasks assessed   SENSATION: Denies N/T in UEs/LEs   COORDINATION: Alternating pronation/supination: dysmetric L>R Alternating toe tap: dysmetric L>R Finger to nose: dysmetric L>R Heel to shin: intact B     MUSCLE TONE: WNL but B calf very tight    POSTURE: rounded shoulders  LOWER EXTREMITY ROM:     Active  Right Eval Left Eval  Hip flexion    Hip extension    Hip abduction    Hip adduction    Hip internal rotation    Hip external rotation    Knee flexion    Knee extension    Ankle dorsiflexion 4 *knee bent 8 knee bent  Ankle plantarflexion    Ankle inversion    Ankle eversion     (Blank rows = not tested)   LOWER EXTREMITY MMT:    MMT (in sitting) Right Eval Left Eval  Hip flexion 4 4-  Hip extension    Hip abduction 4 3+  Hip adduction 3+ 3+  Hip internal rotation    Hip  external rotation    Knee flexion 4 4  Knee extension 5 5  Ankle dorsiflexion 4+ 4  Ankle plantarflexion 4+ 4+  Ankle inversion    Ankle eversion    (Blank rows = not tested)   GAIT: Gait pattern: Slow, wide BOS, unsteady  Assistive device utilized: None Level of assistance: SBA   FUNCTIONAL TESTS:        PATIENT SURVEYS:  FOTO 47, 55    GOALS: Goals reviewed with patient? Yes  SHORT TERM GOALS: Target date: 11/11/2022  Patient to be independent with initial HEP. Baseline: HEP initiated Goal status: MET    LONG TERM GOALS: Target date: 11/25/2022>UPDATED TARGET 01/06/2023  Patient to be independent with advanced HEP. Baseline: Not yet initiated, 11/25/2022: initiated, but pt reports not doing regularly Goal status: PARTIALLY MET 01/25/2023  Patient to demonstrate B ankle DF AROM to at least 10 deg to improve safety with gait.  Baseline: 4 deg, 8 deg with compensations >11/25/2022 met 10 degrees bilaterally Goal status: MET8/12/2022  Patient to demonstrate alternating reciprocal pattern when ascending and descending stairs with good stability and 1 handrail as needed.   Baseline: NT Goal status: MET 11/25/2022  Patient to complete TUG in <14 sec with LRAD in order to decrease risk of falls.   Baseline: 21.38 sec>19.12 sec 11/25/2022>14.07 sec at best 12/30/2022 Goal status: MET  Patient to demonstrate  5xSTS test in <15 sec with good control and posture in order to decrease risk of falls.  Baseline: 13.92 sec with limited eccentric control and not standing fully> 16.25 sec with use of UE support today, not standing fully> 16.47 sec 12/30/2022>16.09 sec 01/25/2023 with UE support Goal status: NOT MET, 01/25/2023  Patient to score at least 47/56 on Berg in order to decrease risk of falls.  Baseline: 42>40/56 11/25/2022> 42/56 12/30/2022 Goal status: NOT MET 12/30/2022  Patient to score at least 55 on FOTO in order to indicate improved functional outcomes.  Baseline: 47>48  11/25/2022 Goal status: NOT MET 01/25/2023   ASSESSMENT:  CLINICAL IMPRESSION: Pt returns today, after missing/showing up late for last several appointments.  Assessed LTGs, with pt meeting 3 of 7 LTGs.  He has partially met LTG 1 as he reports doing some of his exercises (Reviewed and updated HEP to consolidate HEP today).  LTG 2, 3, 4 previously assessed and met.  LTG 5 not met for FTSTS, needing UE support; LTG 6 not met for Berg.  LTG 7 not met for FOTO (improved from 47>51).  Pt has not been consistent with therapy attendance at frequency, and he has not been consistent with his HEP.  He reports beginning to go to another (private) therapy location, which he has been to in the past.  He has had at least 2 falls in the past 4-6 weeks.  Of note, he does report noting slowed memory, word finding since his CVA and is agreeable to PT mentioning speech therapy request to Dr. Carlis Abbott.  At this time, due to lack of progress with PT, pt is appropriate for discharge, with recommendations for continued HEP and use of walking pole or walker at all times.  OBJECTIVE IMPAIRMENTS: Abnormal gait, decreased balance, decreased coordination, and impaired flexibility.   ACTIVITY LIMITATIONS: carrying, lifting, bending, standing, stairs, transfers, bathing, toileting, dressing, reach over head, hygiene/grooming, and locomotion level  PARTICIPATION LIMITATIONS: meal prep, cleaning, laundry, driving, shopping, community activity, yard work, and church  PERSONAL FACTORS: Age, Behavior pattern, Past/current experiences, Time since onset of injury/illness/exacerbation, and 3+ comorbidities: Anxiety, CAD, depression, DM, HTN, HLD, loop recorder, CABG 2008  are also affecting patient's functional outcome.   REHAB POTENTIAL: Good  CLINICAL DECISION MAKING: Evolving/moderate complexity  EVALUATION COMPLEXITY: Moderate  PLAN:  PT FREQUENCY:  1 visit  PT DURATION: 1 visit 01/25/2023  PLANNED INTERVENTIONS: Therapeutic  exercises, Therapeutic activity, Neuromuscular re-education, Balance training, Gait training, Patient/Family education, Self Care, Joint mobilization, Stair training, Vestibular training, Canalith repositioning, DME instructions, Aquatic Therapy, Dry Needling, Electrical stimulation, Cryotherapy, Moist heat, Taping, Manual therapy, and Re-evaluation  PLAN FOR NEXT SESSION: Discharge PT this visit.  Lonia Blood, PT 01/25/23 2:31 PM Phone: [phone] Fax: [phone]  [facility] Rehab at [facility] [address] [place], [address] [place], [place] [address] Phone # [phone] Fax # [phone]

## 2023-02-01 DIAGNOSIS — G464 Cerebellar stroke syndrome: Secondary | ICD-10-CM | POA: Diagnosis not present

## 2023-02-01 DIAGNOSIS — M542 Cervicalgia: Secondary | ICD-10-CM | POA: Diagnosis not present

## 2023-02-01 DIAGNOSIS — R293 Abnormal posture: Secondary | ICD-10-CM | POA: Diagnosis not present

## 2023-02-01 DIAGNOSIS — R29898 Other symptoms and signs involving the musculoskeletal system: Secondary | ICD-10-CM | POA: Diagnosis not present

## 2023-02-03 DIAGNOSIS — M542 Cervicalgia: Secondary | ICD-10-CM | POA: Diagnosis not present

## 2023-02-03 DIAGNOSIS — R293 Abnormal posture: Secondary | ICD-10-CM | POA: Diagnosis not present

## 2023-02-03 DIAGNOSIS — G464 Cerebellar stroke syndrome: Secondary | ICD-10-CM | POA: Diagnosis not present

## 2023-02-03 DIAGNOSIS — R29898 Other symptoms and signs involving the musculoskeletal system: Secondary | ICD-10-CM | POA: Diagnosis not present

## 2023-02-05 ENCOUNTER — Encounter: Payer: Self-pay | Admitting: Cardiology

## 2023-02-05 DIAGNOSIS — I10 Essential (primary) hypertension: Secondary | ICD-10-CM

## 2023-02-05 MED ORDER — ACEBUTOLOL HCL 200 MG PO CAPS
200.0000 mg | ORAL_CAPSULE | Freq: Two times a day (BID) | ORAL | 1 refills | Status: AC
Start: 2023-02-05 — End: ?

## 2023-02-06 DIAGNOSIS — R29898 Other symptoms and signs involving the musculoskeletal system: Secondary | ICD-10-CM | POA: Diagnosis not present

## 2023-02-06 DIAGNOSIS — G464 Cerebellar stroke syndrome: Secondary | ICD-10-CM | POA: Diagnosis not present

## 2023-02-06 DIAGNOSIS — R293 Abnormal posture: Secondary | ICD-10-CM | POA: Diagnosis not present

## 2023-02-06 DIAGNOSIS — M542 Cervicalgia: Secondary | ICD-10-CM | POA: Diagnosis not present

## 2023-02-08 DIAGNOSIS — R293 Abnormal posture: Secondary | ICD-10-CM | POA: Diagnosis not present

## 2023-02-08 DIAGNOSIS — M542 Cervicalgia: Secondary | ICD-10-CM | POA: Diagnosis not present

## 2023-02-08 DIAGNOSIS — G464 Cerebellar stroke syndrome: Secondary | ICD-10-CM | POA: Diagnosis not present

## 2023-02-08 DIAGNOSIS — R29898 Other symptoms and signs involving the musculoskeletal system: Secondary | ICD-10-CM | POA: Diagnosis not present

## 2023-02-10 DIAGNOSIS — M542 Cervicalgia: Secondary | ICD-10-CM | POA: Diagnosis not present

## 2023-02-10 DIAGNOSIS — R293 Abnormal posture: Secondary | ICD-10-CM | POA: Diagnosis not present

## 2023-02-10 DIAGNOSIS — G464 Cerebellar stroke syndrome: Secondary | ICD-10-CM | POA: Diagnosis not present

## 2023-02-10 DIAGNOSIS — R29898 Other symptoms and signs involving the musculoskeletal system: Secondary | ICD-10-CM | POA: Diagnosis not present

## 2023-02-13 DIAGNOSIS — M542 Cervicalgia: Secondary | ICD-10-CM | POA: Diagnosis not present

## 2023-02-13 DIAGNOSIS — R293 Abnormal posture: Secondary | ICD-10-CM | POA: Diagnosis not present

## 2023-02-13 DIAGNOSIS — G464 Cerebellar stroke syndrome: Secondary | ICD-10-CM | POA: Diagnosis not present

## 2023-02-13 DIAGNOSIS — R29898 Other symptoms and signs involving the musculoskeletal system: Secondary | ICD-10-CM | POA: Diagnosis not present

## 2023-02-15 DIAGNOSIS — R293 Abnormal posture: Secondary | ICD-10-CM | POA: Diagnosis not present

## 2023-02-15 DIAGNOSIS — R29898 Other symptoms and signs involving the musculoskeletal system: Secondary | ICD-10-CM | POA: Diagnosis not present

## 2023-02-15 DIAGNOSIS — G464 Cerebellar stroke syndrome: Secondary | ICD-10-CM | POA: Diagnosis not present

## 2023-02-15 DIAGNOSIS — M542 Cervicalgia: Secondary | ICD-10-CM | POA: Diagnosis not present

## 2023-02-23 DIAGNOSIS — R29898 Other symptoms and signs involving the musculoskeletal system: Secondary | ICD-10-CM | POA: Diagnosis not present

## 2023-02-23 DIAGNOSIS — R293 Abnormal posture: Secondary | ICD-10-CM | POA: Diagnosis not present

## 2023-02-23 DIAGNOSIS — G464 Cerebellar stroke syndrome: Secondary | ICD-10-CM | POA: Diagnosis not present

## 2023-02-23 DIAGNOSIS — M542 Cervicalgia: Secondary | ICD-10-CM | POA: Diagnosis not present

## 2023-02-27 DIAGNOSIS — M542 Cervicalgia: Secondary | ICD-10-CM | POA: Diagnosis not present

## 2023-02-27 DIAGNOSIS — G464 Cerebellar stroke syndrome: Secondary | ICD-10-CM | POA: Diagnosis not present

## 2023-02-27 DIAGNOSIS — R29898 Other symptoms and signs involving the musculoskeletal system: Secondary | ICD-10-CM | POA: Diagnosis not present

## 2023-02-27 DIAGNOSIS — R293 Abnormal posture: Secondary | ICD-10-CM | POA: Diagnosis not present

## 2023-03-01 DIAGNOSIS — G464 Cerebellar stroke syndrome: Secondary | ICD-10-CM | POA: Diagnosis not present

## 2023-03-01 DIAGNOSIS — M542 Cervicalgia: Secondary | ICD-10-CM | POA: Diagnosis not present

## 2023-03-01 DIAGNOSIS — R29898 Other symptoms and signs involving the musculoskeletal system: Secondary | ICD-10-CM | POA: Diagnosis not present

## 2023-03-01 DIAGNOSIS — R293 Abnormal posture: Secondary | ICD-10-CM | POA: Diagnosis not present

## 2023-03-06 DIAGNOSIS — E1129 Type 2 diabetes mellitus with other diabetic kidney complication: Secondary | ICD-10-CM | POA: Diagnosis not present

## 2023-03-06 DIAGNOSIS — Z87898 Personal history of other specified conditions: Secondary | ICD-10-CM | POA: Diagnosis not present

## 2023-03-06 DIAGNOSIS — I480 Paroxysmal atrial fibrillation: Secondary | ICD-10-CM | POA: Diagnosis not present

## 2023-03-06 DIAGNOSIS — E669 Obesity, unspecified: Secondary | ICD-10-CM | POA: Diagnosis not present

## 2023-03-06 DIAGNOSIS — G3184 Mild cognitive impairment, so stated: Secondary | ICD-10-CM | POA: Diagnosis not present

## 2023-03-06 DIAGNOSIS — I700 Atherosclerosis of aorta: Secondary | ICD-10-CM | POA: Diagnosis not present

## 2023-03-06 DIAGNOSIS — D692 Other nonthrombocytopenic purpura: Secondary | ICD-10-CM | POA: Diagnosis not present

## 2023-03-06 DIAGNOSIS — N1831 Chronic kidney disease, stage 3a: Secondary | ICD-10-CM | POA: Diagnosis not present

## 2023-03-06 DIAGNOSIS — Z8673 Personal history of transient ischemic attack (TIA), and cerebral infarction without residual deficits: Secondary | ICD-10-CM | POA: Diagnosis not present

## 2023-03-06 DIAGNOSIS — I129 Hypertensive chronic kidney disease with stage 1 through stage 4 chronic kidney disease, or unspecified chronic kidney disease: Secondary | ICD-10-CM | POA: Diagnosis not present

## 2023-03-06 DIAGNOSIS — F3341 Major depressive disorder, recurrent, in partial remission: Secondary | ICD-10-CM | POA: Diagnosis not present

## 2023-03-06 DIAGNOSIS — D6869 Other thrombophilia: Secondary | ICD-10-CM | POA: Diagnosis not present

## 2023-03-07 DIAGNOSIS — R293 Abnormal posture: Secondary | ICD-10-CM | POA: Diagnosis not present

## 2023-03-07 DIAGNOSIS — M542 Cervicalgia: Secondary | ICD-10-CM | POA: Diagnosis not present

## 2023-03-07 DIAGNOSIS — R29898 Other symptoms and signs involving the musculoskeletal system: Secondary | ICD-10-CM | POA: Diagnosis not present

## 2023-03-07 DIAGNOSIS — G464 Cerebellar stroke syndrome: Secondary | ICD-10-CM | POA: Diagnosis not present

## 2023-03-09 DIAGNOSIS — M542 Cervicalgia: Secondary | ICD-10-CM | POA: Diagnosis not present

## 2023-03-09 DIAGNOSIS — R293 Abnormal posture: Secondary | ICD-10-CM | POA: Diagnosis not present

## 2023-03-09 DIAGNOSIS — R29898 Other symptoms and signs involving the musculoskeletal system: Secondary | ICD-10-CM | POA: Diagnosis not present

## 2023-03-09 DIAGNOSIS — G464 Cerebellar stroke syndrome: Secondary | ICD-10-CM | POA: Diagnosis not present

## 2023-03-14 DIAGNOSIS — M542 Cervicalgia: Secondary | ICD-10-CM | POA: Diagnosis not present

## 2023-03-14 DIAGNOSIS — R29898 Other symptoms and signs involving the musculoskeletal system: Secondary | ICD-10-CM | POA: Diagnosis not present

## 2023-03-14 DIAGNOSIS — G464 Cerebellar stroke syndrome: Secondary | ICD-10-CM | POA: Diagnosis not present

## 2023-03-14 DIAGNOSIS — R293 Abnormal posture: Secondary | ICD-10-CM | POA: Diagnosis not present

## 2023-03-20 ENCOUNTER — Encounter: Payer: Self-pay | Admitting: Physical Medicine and Rehabilitation

## 2023-03-20 ENCOUNTER — Encounter: Payer: HMO | Attending: Registered Nurse | Admitting: Physical Medicine and Rehabilitation

## 2023-03-20 VITALS — BP 131/66 | HR 67 | Ht 69.0 in | Wt 181.0 lb

## 2023-03-20 DIAGNOSIS — R350 Frequency of micturition: Secondary | ICD-10-CM | POA: Diagnosis not present

## 2023-03-20 DIAGNOSIS — I10 Essential (primary) hypertension: Secondary | ICD-10-CM | POA: Insufficient documentation

## 2023-03-20 DIAGNOSIS — R2689 Other abnormalities of gait and mobility: Secondary | ICD-10-CM | POA: Diagnosis not present

## 2023-03-20 DIAGNOSIS — N401 Enlarged prostate with lower urinary tract symptoms: Secondary | ICD-10-CM | POA: Diagnosis not present

## 2023-03-20 DIAGNOSIS — I69398 Other sequelae of cerebral infarction: Secondary | ICD-10-CM | POA: Diagnosis not present

## 2023-03-20 DIAGNOSIS — G4701 Insomnia due to medical condition: Secondary | ICD-10-CM | POA: Insufficient documentation

## 2023-03-20 NOTE — Progress Notes (Addendum)
Subjective:    Patient ID: [name], male    DOB: 10/17/1934, 88 y.o.   MRN: [id number]  HPI:  1) Fatigue: new and worsened since the hospitalization -walking is difficult due to fatigue -says he does not eat very well -did a blood test with Dr. Jacinto Halim -sleeps well  2) Suboptimal testosterone  -he is interested in resistance training -he does not eat much  3) Impaired balance: -fell recently -when hs first gets up he waits a second to get everything organized -is doing PT -takes eliquis and tamsulosin  4) BPH:  -only wakes once at night to urinate  [name] is a 88 y.o. male who returns for  HFU appointmen for follow up of his Ischemic cerebrovascular accident, Essential Hypertension and Hyperlipidemia. He presented to [facility] ED  on 10/01/2022 with diplopia and difficulty with balance.   H&P: Dr Julieanne Manson:  This is a new problem. The current episode started less than 1 hour ago. The problem occurs constantly. The problem has not changed since onset.Pertinent negatives include no chest pain, no abdominal pain, no headaches and no shortness of breath. Nothing aggravates the symptoms. Nothing relieves the symptoms. He has tried nothing for the symptoms. The treatment provided no relief.   [name] is a 88 y.o. male with a past medical history significant for CAD, hypertension, hyperlipidemia, diabetes, and sleep apnea who presents for neurologic complaint.  According to patient and family, he was last normal approximately 1 hour prior to arrival at 7:45 PM when he noticed sudden onset of double vision and difficulty with ambulation due to unsteadiness.  He has never had this before.  No history of strokes reported.  No history of TIAs to his knowledge.  He was at his baseline without any symptoms and drove somewhere before this onset.  Denies any headache or neck pain.  Denies trauma.  Denies palpitations, chest pain, shortness of breath.  No history of A-fib to  his knowledge.  No vomiting but had some mild nausea with that initially.  Denies any speech difficulties or any numbness tingling or weakness of extremities.   I was called to see patient in triage and on my evaluation, patient does have disconjugate gaze.  His right eye will not look laterally.  He does not have a facial droop and has intact sensation throughout.  Intact grip strength bilaterally and intact strength in the legs.  Finger-nose-finger was difficult due to the vision changes but appeared intact.  Patient did have right-sided hemianopia in both eyes that he does not know when it began.   Patient was activated as a code stroke and quickly taken to CT scanner.  CT initially did not show evidence of acute bleed or acute stroke.  Neurology felt that although the onset is quite clear when the diplopia began, the hemianopia is uncertain.  Thus, patient is not a candidate for TNK at this time.   They recommended MRI and admission to medicine to [facility] for further stroke workup but get CTA and perfusion if possible initially.   CTA was completed and patient does have a P1 occlusion acutely.  There is penumbra and no core infarct at this time.   I coordinated and spoke with Dr. Stephannie Peters with teleneurology and Dr. Amada Jupiter with [facility] neurology team and they spoke with the interventional neuroradiologist who feels patient is a candidate for thrombectomy.  CT Head: WO Contrast:  IMPRESSION: 1. No acute intracranial process. 2. ASPECTS  is 10.  CTA:  IMPRESSION: 1. Short segment occlusion of the proximal left P1 segment, with additional moderate stenosis in the distal left P2 segment. 2. No infarct core identified. Possible area of decreased perfusion in the superior left cerebellum or left occipital lobe. This could reflect decreased perfusion related to short segment left P1 occlusion. Superior cerebellar decreased perfusion 3. Severe stenosis in the left supraclinoid  ICA and moderate stenosis in the right supraclinoid ICA. 4. Severe stenosis at the origin of the right PICA. Mild stenosis in the distal basilar artery. 5. No hemodynamically significant stenosis in the neck. 6. Aortic atherosclerosis.  MR Brain: WO Contrast:  IMPRESSION: 1. Small acute infarcts scattered in the bilateral cerebellum (left greater than right). Punctate acute lacunar infarcts in both the left pons and thalamus. And minimal occipital lobe restricted diffusion, perhaps greater on the right. 2. No hemorrhagic transformation or intracranial mass effect. 3. Underlying chronic small vessel disease, including chronic thalamic microhemorrhages, and nonspecific ventricular enlargement.   He was transferred to [facility]. He underwent thrombectomy.   [name] was admitted to inpatient rehabilitation on 10/06/2022 and discharged home on 10/12/2022. He is receiving Outpatient Therapy at [facility]. He denies any pain. He rates his pain 0. Also reports his appetite is fair.   Daughter in room, all questions answered.   [name] was driving, discharge instructions stated no driving. This was discussed with DrRaulkar. Dr. Carlis Abbott spoke with patient and daughter, they discussed him driving. Dr Carlis Abbott gave [name] instructions for driving.   Pain Inventory Average Pain 3 Pain Right Now 3 My pain is  No pain. Only problem with eye sight  and balance  LOCATION OF PAIN  left hip  BOWEL Number of stools per week: 1-2 Oral laxative use  stool softener   BLADDER Normal and Pads Taking medication for bladder control.   Mobility use a cane ability to climb steps?  yes do you drive?  yes Do you have any goals in this area?  yes  Function retired I need assistance with the following:  meal prep and household duties Do you have any goals in this area?  yes  Neuro/Psych trouble walking  Prior Studies Any changes since last visit?  no  Physicians involved in  your care Any changes since last visit?  no   Family History  Problem Relation Age of Onset   Heart attack Mother    Clotting disorder Father        died of renal disease   Heart disease Sister    Cancer Sister    Colon cancer Neg Hx    Social History   Socioeconomic History   Marital status: Widowed    Spouse name: Not on file   Number of children: Not on file   Years of education: Not on file   Highest education level: Not on file  Occupational History   Occupation: Field seismologist  Tobacco Use   Smoking status: Never   Smokeless tobacco: Never  Vaping Use   Vaping status: Never Used  Substance and Sexual Activity   Alcohol use: No    Alcohol/week: 1.0 standard drink of alcohol    Types: 1 Glasses of wine per week   Drug use: No   Sexual activity: Not on file  Other Topics Concern   Not on file  Social History Narrative   Retired Surveyor, minerals.  Widowed since 2014.  He has multiple children.  Never smoker 5 caffeinated beverages daily and no  alcohol   Social Determinants of Health   Financial Resource Strain: Not on file  Food Insecurity: Not on file  Transportation Needs: Not on file  Physical Activity: Not on file  Stress: Not on file  Social Connections: Not on file   Past Surgical History:  Procedure Laterality Date   COLONOSCOPY W/ BIOPSIES AND POLYPECTOMY     CORONARY ARTERY BYPASS GRAFT  2008   VESSELS X6   IR CT HEAD LTD  10/02/2022   IR PERCUTANEOUS ART THROMBECTOMY/INFUSION INTRACRANIAL INC DIAG ANGIO  10/02/2022   IR US GUIDE VASC ACCESS RIGHT  10/02/2022   LOOP RECORDER IMPLANT     RADIOLOGY WITH ANESTHESIA N/A 10/02/2022   Procedure: IR WITH ANESTHESIA;  Surgeon: Radiologist, Medication, MD;  Location: [facility] OR;  Service: Radiology;  Laterality: N/A;   UMBILICAL HERNIA REPAIR     Past Medical History:  Diagnosis Date   Acute renal failure (HCC)    secondary to nephrotic syndrome post cath, resolved with steroids   Anxiety    Arthritis     CAD (coronary artery disease) 04/2006   LIMA to LAD, sequential SVG to first, second, and third OM's, sequential SVG to mid RCA and PDA    Depression    Diabetes mellitus without complication (HCC)    ED (erectile dysfunction)    HTN (hypertension)    Hx of adenomatous colonic polyps    Hyperlipidemia    Loop recorder: Biotronic Biomonitor II loop 07/28/2021 07/28/2021   Obesity    Stroke (HCC)    june   BP 131/66   Pulse 67   Ht 5\' 9"  (1.753 m)   Wt 181 lb (82.1 kg)   SpO2 97%   BMI 26.73 kg/m   Opioid Risk Score:   Fall Risk Score:  `1  Depression screen [facility] 2/9     03/20/2023   10:14 AM 12/12/2022   10:59 AM 11/07/2022   10:51 AM  Depression screen PHQ 2/9  Decreased Interest 3 0 1  Down, Depressed, Hopeless 3 0 1  PHQ - 2 Score 6 0 2  Altered sleeping   0  Tired, decreased energy   1  Change in appetite   1  Feeling bad or failure about yourself    1  Trouble concentrating   1  Moving slowly or fidgety/restless   1  Suicidal thoughts   0  PHQ-9 Score   7    Review of Systems  Eyes:  Positive for visual disturbance.  Musculoskeletal:        Balance  All other systems reviewed and are negative.      Objective:   Not performed       Assessment & Plan:  1.Ischemic cerebrovascular accident: Continue Outpatient Therapy at [facility]. He has a scheduled appointment with Neurology. Continue current medication regimen. Continue to Monitor.   -discussed return to driving and that he is doing well.   -continue PT and OT  2. Essential Hypertension: Cardiology following. Continue current medication regimen. Continue to Monitor.   3.  Hyperlipidemia: Continue current medication regimen: PCP/Cardiology following. Continue to Monitor.   4. Daytime fatigue:  -B12, vitamin D, iron ordered -testosterone level ordered  5. Bradycardia: -discussed that acebutolol ordered -f/u with cardiology to see if acebutolol can be decreased further  6. Type 2  diabetes: -discussed that goal blood sugar 70-90.   7) Vitamin D insufficiency:  -ergocalciferol 50,000U prescribed once per week for 7 weeks  8) Insomnia: -Try to go  outside near sunrise -Get exercise during the day.  -Turn off all devices an hour before bedtime.  -Teas that can benefit: chamomile, valerian root, Brahmi (Bacopa) -Can consider over the counter melatonin, magnesium, and/or L-theanine. Melatonin is an anti-oxidant with multiple health benefits. Magnesium is involved in greater than 300 enzymatic reactions in the body and most of [place] are deficient as our soil is often depleted. There are 7 different types of magnesium- Bioptemizer's is a supplement with all 7 types, and each has unique benefits. Magnesium can also help with constipation and anxiety.  -Pistachios naturally increase the production of melatonin -Cozy Earth bamboo bed sheets are free from toxic chemicals.  -Tart cherry juice or a tart cherry supplement can improve sleep and soreness post-workout   9) Impaired balance: -continue PT  10) BPH: -continue flomax  11) HTN: -BP is ___today.  -Advised checking BP daily at home and logging results to bring into follow-up appointment with PCP and myself. -Reviewed BP meds today.  -Advised regarding healthy foods that can help lower blood pressure and provided with a list: 1) citrus foods- high in vitamins and minerals 2) salmon and other fatty fish - reduces inflammation and oxylipins 3) swiss chard (leafy green)- high level of nitrates 4) pumpkin seeds- one of the best natural sources of magnesium 5) Beans and lentils- high in fiber, magnesium, and potassium 6) Berries- high in flavonoids 7) Amaranth (whole grain, can be cooked similarly to rice and oats)- high in magnesium and fiber 8) Pistachios- even more effective at reducing BP than other nuts 9) Carrots- high in phenolic compounds that relax blood vessels and reduce inflammation 10) Celery- contain  phthalides that relax tissues of arterial walls 11) Tomatoes- can also improve cholesterol and reduce risk of heart disease 12) Broccoli- good source of magnesium, calcium, and potassium 13) Greek yogurt: high in potassium and calcium 14) Herbs and spices: Celery seed, cilantro, saffron, lemongrass, black cumin, ginseng, cinnamon, cardamom, sweet basil, and ginger 15) Chia and flax seeds- also help to lower cholesterol and blood sugar 16) Beets- high levels of nitrates that relax blood vessels  17) spinach and bananas- high in potassium  -Provided lise of supplements that can help with hypertension:  1) magnesium: one high quality brand is Bioptemizers since it contains all 7 types of magnesium, otherwise over the counter magnesium gluconate 400mg  is a good option 2) B vitamins 3) vitamin D 4) potassium 5) CoQ10 6) L-arginine 7) Vitamin C 8) Beetroot -Educated that goal BP is 120/80. -Made goal to incorporate some of the above foods into diet.

## 2023-03-20 NOTE — Patient Instructions (Signed)
Insomnia: -Try to go outside near sunrise -Get exercise during the day.  -Turn off all devices an hour before bedtime.  -Teas that can benefit: chamomile, valerian root, Brahmi (Bacopa) -Can consider over the counter melatonin, magnesium, and/or L-theanine. Melatonin is an anti-oxidant with multiple health benefits. Magnesium is involved in greater than 300 enzymatic reactions in the body and most of us are deficient as our soil is often depleted. There are 7 different types of magnesium- Bioptemizer's is a supplement with all 7 types, and each has unique benefits. Magnesium can also help with constipation and anxiety.  -Pistachios naturally increase the production of melatonin -Cozy Earth bamboo bed sheets are free from toxic chemicals.  -Tart cherry juice or a tart cherry supplement can improve sleep and soreness post-workout   

## 2023-03-21 DIAGNOSIS — G464 Cerebellar stroke syndrome: Secondary | ICD-10-CM | POA: Diagnosis not present

## 2023-03-21 DIAGNOSIS — R293 Abnormal posture: Secondary | ICD-10-CM | POA: Diagnosis not present

## 2023-03-21 DIAGNOSIS — R29898 Other symptoms and signs involving the musculoskeletal system: Secondary | ICD-10-CM | POA: Diagnosis not present

## 2023-03-21 DIAGNOSIS — M542 Cervicalgia: Secondary | ICD-10-CM | POA: Diagnosis not present

## 2023-03-23 DIAGNOSIS — R293 Abnormal posture: Secondary | ICD-10-CM | POA: Diagnosis not present

## 2023-03-23 DIAGNOSIS — R29898 Other symptoms and signs involving the musculoskeletal system: Secondary | ICD-10-CM | POA: Diagnosis not present

## 2023-03-23 DIAGNOSIS — M542 Cervicalgia: Secondary | ICD-10-CM | POA: Diagnosis not present

## 2023-03-23 DIAGNOSIS — G464 Cerebellar stroke syndrome: Secondary | ICD-10-CM | POA: Diagnosis not present

## 2023-03-27 DIAGNOSIS — R293 Abnormal posture: Secondary | ICD-10-CM | POA: Diagnosis not present

## 2023-03-27 DIAGNOSIS — G464 Cerebellar stroke syndrome: Secondary | ICD-10-CM | POA: Diagnosis not present

## 2023-03-27 DIAGNOSIS — M542 Cervicalgia: Secondary | ICD-10-CM | POA: Diagnosis not present

## 2023-03-27 DIAGNOSIS — R29898 Other symptoms and signs involving the musculoskeletal system: Secondary | ICD-10-CM | POA: Diagnosis not present

## 2023-03-29 DIAGNOSIS — R29898 Other symptoms and signs involving the musculoskeletal system: Secondary | ICD-10-CM | POA: Diagnosis not present

## 2023-03-29 DIAGNOSIS — R293 Abnormal posture: Secondary | ICD-10-CM | POA: Diagnosis not present

## 2023-03-29 DIAGNOSIS — M542 Cervicalgia: Secondary | ICD-10-CM | POA: Diagnosis not present

## 2023-03-29 DIAGNOSIS — G464 Cerebellar stroke syndrome: Secondary | ICD-10-CM | POA: Diagnosis not present

## 2023-04-06 ENCOUNTER — Telehealth: Payer: Self-pay | Admitting: Physical Medicine and Rehabilitation

## 2023-04-06 NOTE — Telephone Encounter (Signed)
Pt called requesting a call to set an appt with Dr Alvester Morin for back/leg pains Last injection 04/19/2022. Please call pt at [phone].

## 2023-04-10 ENCOUNTER — Ambulatory Visit (INDEPENDENT_AMBULATORY_CARE_PROVIDER_SITE_OTHER): Payer: HMO | Admitting: Physical Medicine and Rehabilitation

## 2023-04-10 ENCOUNTER — Encounter: Payer: Self-pay | Admitting: Physical Medicine and Rehabilitation

## 2023-04-10 VITALS — BP 155/122

## 2023-04-10 DIAGNOSIS — M47816 Spondylosis without myelopathy or radiculopathy, lumbar region: Secondary | ICD-10-CM | POA: Diagnosis not present

## 2023-04-10 DIAGNOSIS — M25551 Pain in right hip: Secondary | ICD-10-CM | POA: Diagnosis not present

## 2023-04-10 DIAGNOSIS — R2681 Unsteadiness on feet: Secondary | ICD-10-CM

## 2023-04-10 DIAGNOSIS — M48061 Spinal stenosis, lumbar region without neurogenic claudication: Secondary | ICD-10-CM

## 2023-04-10 NOTE — Progress Notes (Unsigned)
[name] - 88 y.o. male MRN [id number]  Date of birth: 1934/07/10  Office Visit Note: Visit Date: 04/10/2023 PCP: Cleatis Polka., MD Referred by: Cleatis Polka., MD  Subjective: Chief Complaint  Patient presents with   Right Leg - Pain   HPI: [name] is a 88 y.o. male who comes in today for evaluation of chronic, worsening and severe bilateral lower back pain, feeling of right leg weakness. Pain ongoing for several years, worsens when moving from sitting to standing position. States he is worried right leg is going to give way when walking. He describes pain as sore, aching and weak sensation, currently rates as 6 out of 10. Some relief of pain with home exercise regimen, rest and use of medications. He is currently attending formal physical therapy with [facility], good relief of pain with these treatments. Lumbar MRI imaging from 2021 exhibits 4 mm anterolisthesis with advanced facet degeneration and moderate spinal canal stenosis at L4-L5.  Patient is managed from an orthopedic standpoint by Dr. Doneen Poisson, bilateral hip x-ray imaging from 2023 exhibits mild narrowing bilateral hip joints. He has undergone multiple intermittent right intra-articular hip injections in our office with significant relief of pain. No history of lumbar epidural steroid injections in our office. He did undergo lumbar injections with Dr. Romero Belling several years ago with minimal relief of pain. Patient denies focal weakness, numbness and tingling. Recent fall at home with no injuries.  History of stroke in June of 2024, he is currently followed by Dr. Sula Soda with [facility]. He continues to have issues with balance and gait post stroke.      Review of Systems  Musculoskeletal:  Positive for back pain.  Neurological:  Negative for tingling, sensory change, focal weakness and weakness.  All other systems reviewed and are negative.  Otherwise  per HPI.  Assessment & Plan: Visit Diagnoses:    ICD-10-CM   1. Pain in right hip  M25.551 Ambulatory referral to Physical Medicine Rehab    2. Spondylosis without myelopathy or radiculopathy, lumbar region  M47.816 Ambulatory referral to Physical Medicine Rehab    3. Spinal stenosis of lumbar region without neurogenic claudication  M48.061 Ambulatory referral to Physical Medicine Rehab    4. Unsteady gait  R26.81 Ambulatory referral to Physical Medicine Rehab       Plan: Findings:  Chronic, worsening and severe bilateral lower back pain, feeling of right leg weakness and leg giving way. Patient continues to have severe pain despite good conservative therapies such as formal physical therapy, home exercise regimen, rest and use of medications. Patients clinical presentation and exam are complex, differentials include right intrinsic hip issue vs lumbar radiculopathy. No real pain today with rotation of right hip. There is moderate spinal canal stenosis at the level of L4-L5. We discussed treatment plan in detail today, next step is to perform diagnostic and hopefully therapeutic right intra-articular hip injection. I would like see patient back for follow up in 2 weeks post injection. Should his pain continue would consider obtaining new lumbar MRI imaging. Patient has no questions at this time. No red flag symptoms noted upon exam today.     Meds & Orders: No orders of the defined types were placed in this encounter.   Orders Placed This Encounter  Procedures   Ambulatory referral to Physical Medicine Rehab    Follow-up: Return for Right intra-articular hip injection.   Procedures: No procedures performed  Clinical History: EXAM: MRI LUMBAR SPINE WITHOUT CONTRAST   TECHNIQUE: Multiplanar, multisequence MR imaging of the lumbar spine was performed. No intravenous contrast was administered.   COMPARISON:  Lumbar MRI 12/28/2017   FINDINGS: Segmentation:  Normal    Alignment: 4 mm anterolisthesis L4-5 unchanged. Remaining alignment normal.   Vertebrae:  Negative for fracture or mass.  No bone marrow edema.   Conus medullaris and cauda equina: Conus extends to the L1 level. Conus and cauda equina appear normal.   Paraspinal and other soft tissues: Negative for paraspinous mass or adenopathy. No it soft tissue edema or fluid collection.   Disc levels:   T12-L1: Negative   L1-2: Mild disc and mild facet degeneration. Negative for disc protrusion or stenosis.   L2-3: Diffuse disc bulging and endplate spurring. Bilateral facet hypertrophy. Mild spinal stenosis and moderate subarticular stenosis bilaterally.   L3-4: Disc degeneration with diffuse disc bulging and endplate spurring. Bilateral facet hypertrophy. Mild spinal stenosis and moderate subarticular stenosis bilaterally due to spurring. . Interval resolution of small synovial cyst on the left.   L4-5: 4 mm anterolisthesis with advanced facet degeneration. Disc degeneration with diffuse disc bulging. Moderate spinal stenosis. Moderate to severe subarticular stenosis bilaterally. No change since the prior MRI.   L5-S1: Disc degeneration with diffuse endplate spurring. Mild facet degeneration. Moderate subarticular and foraminal stenosis on the right. Mild subarticular stenosis on the left.   IMPRESSION: Multilevel degenerative change throughout the lumbar spine similar to the prior MRI. There is resolution of the left-sided small synovial cyst at L3-4. No new or acute findings.     Electronically Signed   By: Marlan Palau M.D.   On: 10/13/2019 09:37   He reports that he has never smoked. He has never used smokeless tobacco.  Recent Labs    10/02/22 0227  HGBA1C 8.4*    Objective:  VS:  HT:    WT:   BMI:     BP:(!) 155/122  HR: bpm  TEMP: ( )  RESP:  Physical Exam Vitals and nursing note reviewed.  HENT:     Head: Normocephalic and atraumatic.     Right Ear:  External ear normal.     Left Ear: External ear normal.     Nose: Nose normal.     Mouth/Throat:     Mouth: Mucous membranes are moist.  Eyes:     Extraocular Movements: Extraocular movements intact.  Cardiovascular:     Rate and Rhythm: Normal rate.     Pulses: Normal pulses.  Pulmonary:     Effort: Pulmonary effort is normal.  Abdominal:     General: Abdomen is flat. There is no distension.  Musculoskeletal:        General: Tenderness present.     Cervical back: Normal range of motion.     Comments: Patient difficult to rise from seated position to standing. Good lumbar range of motion. No pain noted with facet loading. 5/5 strength noted with bilateral hip flexion, knee flexion/extension, ankle dorsiflexion/plantarflexion and EHL. No clonus noted bilaterally. No pain upon palpation of greater trochanters. No pain with internal/external rotation of bilateral hips. Sensation intact bilaterally. Negative slump test bilaterally. Ambulates without aid, gait slow and unsteady.   Skin:    General: Skin is warm and dry.     Capillary Refill: Capillary refill takes less than 2 seconds.  Neurological:     General: No focal deficit present.     Mental Status: He is alert and oriented to person,  place, and time.  Psychiatric:        Mood and Affect: Mood normal.        Behavior: Behavior normal.     Ortho Exam  Imaging: No results found.  Past Medical/Family/Surgical/Social History: Medications & Allergies reviewed per EMR, new medications updated. Patient Active Problem List   Diagnosis Date Noted   Ischemic cerebrovascular accident (CVA) (HCC) 10/06/2022   Status post surgery 10/02/2022   Stroke: Multiple scattered bilateral ischemic infarcts s/p thrombectomy of left P1/PCA TICI 3 Etiology: Likely cardioembolic 10/02/2022   Hemianopsia 10/01/2022   Right homonymous hemianopsia 10/01/2022   Loop recorder: Biotronic Biomonitor II loop 07/28/2021 07/28/2021   Unsteady gait 08/05/2020    BPH with obstruction/lower urinary tract symptoms 01/21/2020   Anxiety disorder 02/02/2018   Right hip pain 10/02/2017   Recurrent major depression in remission (HCC) 01/27/2017   Chronic kidney disease, stage 3a (HCC) 07/17/2015   History of colonic polyps 03/26/2013   Obesity 10/04/2012   OBSTRUCTIVE SLEEP APNEA 10/30/2008   HYPERLIPIDEMIA-MIXED 07/01/2008   HYPERTENSION, BENIGN 07/01/2008   CAD, NATIVE VESSEL 07/01/2008   FATIGUE / MALAISE 07/01/2008   EDEMA 07/01/2008   Past Medical History:  Diagnosis Date   Acute renal failure (HCC)    secondary to nephrotic syndrome post cath, resolved with steroids   Anxiety    Arthritis    CAD (coronary artery disease) 04/2006   LIMA to LAD, sequential SVG to first, second, and third OM's, sequential SVG to mid RCA and PDA    Depression    Diabetes mellitus without complication (HCC)    ED (erectile dysfunction)    HTN (hypertension)    Hx of adenomatous colonic polyps    Hyperlipidemia    Loop recorder: Biotronic Biomonitor II loop 07/28/2021 07/28/2021   Obesity    Stroke (HCC)    june   Family History  Problem Relation Age of Onset   Heart attack Mother    Clotting disorder Father        died of renal disease   Heart disease Sister    Cancer Sister    Colon cancer Neg Hx    Past Surgical History:  Procedure Laterality Date   COLONOSCOPY W/ BIOPSIES AND POLYPECTOMY     CORONARY ARTERY BYPASS GRAFT  2008   VESSELS X6   IR CT HEAD LTD  10/02/2022   IR PERCUTANEOUS ART THROMBECTOMY/INFUSION INTRACRANIAL INC DIAG ANGIO  10/02/2022   IR US GUIDE VASC ACCESS RIGHT  10/02/2022   LOOP RECORDER IMPLANT     RADIOLOGY WITH ANESTHESIA N/A 10/02/2022   Procedure: IR WITH ANESTHESIA;  Surgeon: Radiologist, Medication, MD;  Location: [facility] OR;  Service: Radiology;  Laterality: N/A;   UMBILICAL HERNIA REPAIR     Social History   Occupational History   Occupation: Field seismologist  Tobacco Use   Smoking status: Never   Smokeless  tobacco: Never  Vaping Use   Vaping status: Never Used  Substance and Sexual Activity   Alcohol use: No    Alcohol/week: 1.0 standard drink of alcohol    Types: 1 Glasses of wine per week   Drug use: No   Sexual activity: Not on file

## 2023-04-10 NOTE — Progress Notes (Unsigned)
He states his legs are not working good due to having a stroke on fathers day.  He has pain in his lower back below his waist. He stated it hurts on Both sides.  No injury.  He is very off balance.

## 2023-04-24 ENCOUNTER — Ambulatory Visit: Payer: HMO | Admitting: Physical Medicine and Rehabilitation

## 2023-04-24 ENCOUNTER — Ambulatory Visit: Payer: HMO

## 2023-04-24 ENCOUNTER — Encounter: Payer: HMO | Admitting: Physical Medicine and Rehabilitation

## 2023-04-24 ENCOUNTER — Telehealth: Payer: Self-pay | Admitting: Physical Medicine and Rehabilitation

## 2023-04-24 DIAGNOSIS — I4819 Other persistent atrial fibrillation: Secondary | ICD-10-CM

## 2023-04-24 NOTE — Telephone Encounter (Signed)
 Patient called to reschedule his appointment due to the weather. CB#[phone]

## 2023-04-26 ENCOUNTER — Ambulatory Visit (INDEPENDENT_AMBULATORY_CARE_PROVIDER_SITE_OTHER): Payer: HMO | Admitting: Physical Medicine and Rehabilitation

## 2023-04-26 ENCOUNTER — Telehealth: Payer: Self-pay | Admitting: Physical Medicine and Rehabilitation

## 2023-04-26 ENCOUNTER — Other Ambulatory Visit: Payer: Self-pay

## 2023-04-26 DIAGNOSIS — M25551 Pain in right hip: Secondary | ICD-10-CM

## 2023-04-26 NOTE — Progress Notes (Signed)
 CAP MASSI - 89 y.o. male MRN [id number]  Date of birth: Dec 11, 1934  Office Visit Note: Visit Date: 04/26/2023 PCP: Loreli Elsie JONETTA Mickey., MD Referred by: Loreli Elsie JONETTA Mickey., MD  Subjective: Chief Complaint  Patient presents with   Right Hip - Pain   HPI:  [name] is a 89 y.o. male who comes in today at the request of Duwaine Pouch, FNP for planned Right anesthetic hip arthrogram with fluoroscopic guidance.  The patient has failed conservative care including home exercise, medications, time and activity modification.  This injection will be diagnostic and hopefully therapeutic.  Please see requesting physician notes for further details and justification.   ROS Otherwise per HPI.  Assessment & Plan: Visit Diagnoses:    ICD-10-CM   1. Pain in right hip  M25.551 XR C-ARM NO REPORT    Large Joint Inj: R hip joint      Plan: No additional findings.   Meds & Orders: No orders of the defined types were placed in this encounter.   Orders Placed This Encounter  Procedures   Large Joint Inj: R hip joint   XR C-ARM NO REPORT    Follow-up: Return if symptoms worsen or fail to improve.   Procedures: Large Joint Inj: R hip joint on 04/26/2023 3:43 PM Indications: diagnostic evaluation and pain Details: 22 G 3.5 in needle, fluoroscopy-guided anterior approach  Arthrogram: No  Medications: 4 mL bupivacaine  0.25 %; 40 mg triamcinolone  acetonide 40 MG/ML Outcome: tolerated well, no immediate complications  There was excellent flow of contrast producing a partial arthrogram of the hip. The patient did have SOME relief of symptoms during the anesthetic phase of the injection. Procedure, treatment alternatives, risks and benefits explained, specific risks discussed. Consent was given by the patient. Immediately prior to procedure a time out was called to verify the correct patient, procedure, equipment, support staff and site/side marked as required. Patient was prepped and  draped in the usual sterile fashion.          Clinical History: EXAM: MRI LUMBAR SPINE WITHOUT CONTRAST   TECHNIQUE: Multiplanar, multisequence MR imaging of the lumbar spine was performed. No intravenous contrast was administered.   COMPARISON:  Lumbar MRI 12/28/2017   FINDINGS: Segmentation:  Normal   Alignment: 4 mm anterolisthesis L4-5 unchanged. Remaining alignment normal.   Vertebrae:  Negative for fracture or mass.  No bone marrow edema.   Conus medullaris and cauda equina: Conus extends to the L1 level. Conus and cauda equina appear normal.   Paraspinal and other soft tissues: Negative for paraspinous mass or adenopathy. No it soft tissue edema or fluid collection.   Disc levels:   T12-L1: Negative   L1-2: Mild disc and mild facet degeneration. Negative for disc protrusion or stenosis.   L2-3: Diffuse disc bulging and endplate spurring. Bilateral facet hypertrophy. Mild spinal stenosis and moderate subarticular stenosis bilaterally.   L3-4: Disc degeneration with diffuse disc bulging and endplate spurring. Bilateral facet hypertrophy. Mild spinal stenosis and moderate subarticular stenosis bilaterally due to spurring. . Interval resolution of small synovial cyst on the left.   L4-5: 4 mm anterolisthesis with advanced facet degeneration. Disc degeneration with diffuse disc bulging. Moderate spinal stenosis. Moderate to severe subarticular stenosis bilaterally. No change since the prior MRI.   L5-S1: Disc degeneration with diffuse endplate spurring. Mild facet degeneration. Moderate subarticular and foraminal stenosis on the right. Mild subarticular stenosis on the left.   IMPRESSION: Multilevel degenerative change throughout the lumbar spine  similar to the prior MRI. There is resolution of the left-sided small synovial cyst at L3-4. No new or acute findings.     Electronically Signed   By: Carlin Gaskins M.D.   On: 10/13/2019 09:37      Objective:  VS:  HT:    WT:   BMI:     BP:   HR: bpm  TEMP: ( )  RESP:  Physical Exam   Imaging: No results found.

## 2023-04-26 NOTE — Telephone Encounter (Signed)
 Patient came in and needs to reschedule his appointment that he missed on Monday. CB#[phone]

## 2023-05-01 ENCOUNTER — Telehealth: Payer: Self-pay | Admitting: Radiology

## 2023-05-01 ENCOUNTER — Telehealth: Payer: Self-pay

## 2023-05-01 LAB — CUP PACEART REMOTE DEVICE CHECK
Date Time Interrogation Session: 20250106103746
Implantable Pulse Generator Implant Date: 20230812
Pulse Gen Model: 436066
Pulse Gen Serial Number: 94081109

## 2023-05-01 NOTE — Telephone Encounter (Signed)
 Patient called me today LMVM advising the injection last week did not help, and wants to set up another appt with Dr Alvester Morin.  Will you please call him and discuss waiting 2 weeks or so, etc?  And if f/u needed schedule?  Thanks-

## 2023-05-01 NOTE — Telephone Encounter (Signed)
 Patient came into the office stating the injection didn't help at all. He does have an upcoming apt with Aundra Millet but would like to know what the next steps are. He stated that he has been trying to call but has been unable to get though.

## 2023-05-03 MED ORDER — BUPIVACAINE HCL 0.25 % IJ SOLN
4.0000 mL | INTRAMUSCULAR | Status: AC | PRN
Start: 2023-04-26 — End: 2023-04-26
  Administered 2023-04-26: 4 mL via INTRA_ARTICULAR

## 2023-05-03 MED ORDER — TRIAMCINOLONE ACETONIDE 40 MG/ML IJ SUSP
40.0000 mg | INTRAMUSCULAR | Status: AC | PRN
Start: 2023-04-26 — End: 2023-04-26
  Administered 2023-04-26: 40 mg via INTRA_ARTICULAR

## 2023-05-04 ENCOUNTER — Ambulatory Visit (INDEPENDENT_AMBULATORY_CARE_PROVIDER_SITE_OTHER): Payer: HMO | Admitting: Physical Medicine and Rehabilitation

## 2023-05-04 ENCOUNTER — Encounter: Payer: Self-pay | Admitting: Physical Medicine and Rehabilitation

## 2023-05-04 DIAGNOSIS — M48061 Spinal stenosis, lumbar region without neurogenic claudication: Secondary | ICD-10-CM

## 2023-05-04 DIAGNOSIS — M5416 Radiculopathy, lumbar region: Secondary | ICD-10-CM | POA: Diagnosis not present

## 2023-05-04 DIAGNOSIS — M4316 Spondylolisthesis, lumbar region: Secondary | ICD-10-CM | POA: Diagnosis not present

## 2023-05-04 DIAGNOSIS — R2681 Unsteadiness on feet: Secondary | ICD-10-CM | POA: Diagnosis not present

## 2023-05-04 NOTE — Progress Notes (Signed)
[name] - 89 y.o. male MRN [id number]  Date of birth: Jun 18, 1934  Office Visit Note: Visit Date: 05/04/2023 PCP: Cleatis Polka., MD Referred by: Cleatis Polka., MD  Subjective: Chief Complaint  Patient presents with   Lower Back - Pain   HPI: [name] is a 89 y.o. male who comes in today for evaluation of chronic, worsening and severe left sided lower back pain radiating down left leg. Patient is somewhat of a poor historian. His pain is now more localized to left side today. States his left leg feels very weak. His pain worsens with standing, walking and activity. He describes pain as sore, aching and weak sensation, currently rates as 5 out of 10. History of formal physical therapy with [facility], good relief of pain with these treatments. Lumbar MRI imaging from 2021 exhibits 4 mm anterolisthesis with advanced facet degeneration and moderate spinal canal stenosis at L4-L5. Patient is managed from an orthopedic standpoint by Dr. Doneen Poisson, bilateral hip x-ray imaging from 2023 exhibits mild narrowing bilateral hip joints. He has undergone multiple intermittent right intra-articular hip injections in our office with significant relief of pain. He recently underwent right intra-articular hip injection on 04/26/2023, he reports no relief of pain with this procedure, however he is not experiencing right sided pain today. History of lumbar epidural steroid injections with Dr. Romero Belling several years ago with minimal relief of pain. Patient denies focal weakness, numbness and tingling. He currently ambulates with walking stick.    History of stroke in June of 2024, he is currently followed by Dr. Sula Soda with [facility]. He continues to have issues with balance and gait post stroke.           Review of Systems  Musculoskeletal:  Positive for back pain and falls.  Neurological:  Positive for weakness. Negative for tingling.   All other systems reviewed and are negative.  Otherwise per HPI.  Assessment & Plan: Visit Diagnoses:    ICD-10-CM   1. Lumbar radiculopathy  M54.16 Ambulatory referral to Physical Medicine Rehab    2. Spinal stenosis of lumbar region without neurogenic claudication  M48.061 Ambulatory referral to Physical Medicine Rehab    3. Spondylolisthesis of lumbar region  M43.16 Ambulatory referral to Physical Medicine Rehab    4. Unsteady gait  R26.81 Ambulatory referral to Physical Medicine Rehab       Plan: Findings:  Chronic, worsening and severe left sided lower back pain radiating down left leg. Feeling of weakness to entire left leg. Patient continues to have severe pain despite good conservative therapies such as formal physical therapy, home exercise regimen, rest and use of medications. Patients clinical presentation and exam are consistent with neurogenic claudication as a result of spinal canal stenosis. There is moderate spinal canal stenosis at the level of L4-L5. We discussed treatment plan in detail today, previous right intra-articular hip injection did not provide relief of pain. Next step is to perform diagnostic and hopefully therapeutic left L4 transforaminal epidural steroid injection under fluoroscopic guidance. If good relief of pain with injection we can repeat this procedure infrequently as needed. I discussed injection procedure in detail with patient today, he has no questions at this time. No red flag symptoms noted upon exam today.     Meds & Orders: No orders of the defined types were placed in this encounter.   Orders Placed This Encounter  Procedures   Ambulatory referral to Physical  Medicine Rehab    Follow-up: Return for Left L4 transformainal epidural steroid injection.   Procedures: No procedures performed      Clinical History: EXAM: MRI LUMBAR SPINE WITHOUT CONTRAST   TECHNIQUE: Multiplanar, multisequence MR imaging of the lumbar spine  was performed. No intravenous contrast was administered.   COMPARISON:  Lumbar MRI 12/28/2017   FINDINGS: Segmentation:  Normal   Alignment: 4 mm anterolisthesis L4-5 unchanged. Remaining alignment normal.   Vertebrae:  Negative for fracture or mass.  No bone marrow edema.   Conus medullaris and cauda equina: Conus extends to the L1 level. Conus and cauda equina appear normal.   Paraspinal and other soft tissues: Negative for paraspinous mass or adenopathy. No it soft tissue edema or fluid collection.   Disc levels:   T12-L1: Negative   L1-2: Mild disc and mild facet degeneration. Negative for disc protrusion or stenosis.   L2-3: Diffuse disc bulging and endplate spurring. Bilateral facet hypertrophy. Mild spinal stenosis and moderate subarticular stenosis bilaterally.   L3-4: Disc degeneration with diffuse disc bulging and endplate spurring. Bilateral facet hypertrophy. Mild spinal stenosis and moderate subarticular stenosis bilaterally due to spurring. . Interval resolution of small synovial cyst on the left.   L4-5: 4 mm anterolisthesis with advanced facet degeneration. Disc degeneration with diffuse disc bulging. Moderate spinal stenosis. Moderate to severe subarticular stenosis bilaterally. No change since the prior MRI.   L5-S1: Disc degeneration with diffuse endplate spurring. Mild facet degeneration. Moderate subarticular and foraminal stenosis on the right. Mild subarticular stenosis on the left.   IMPRESSION: Multilevel degenerative change throughout the lumbar spine similar to the prior MRI. There is resolution of the left-sided small synovial cyst at L3-4. No new or acute findings.     Electronically Signed   By: Marlan Palau M.D.   On: 10/13/2019 09:37   He reports that he has never smoked. He has never used smokeless tobacco.  Recent Labs    10/02/22 0227  HGBA1C 8.4*    Objective:  VS:  HT:    WT:   BMI:     BP:   HR: bpm  TEMP: ( )   RESP:  Physical Exam Vitals and nursing note reviewed.  HENT:     Head: Normocephalic and atraumatic.     Right Ear: External ear normal.     Left Ear: External ear normal.     Nose: Nose normal.     Mouth/Throat:     Mouth: Mucous membranes are moist.  Eyes:     Extraocular Movements: Extraocular movements intact.  Cardiovascular:     Rate and Rhythm: Normal rate.     Pulses: Normal pulses.  Pulmonary:     Effort: Pulmonary effort is normal.  Abdominal:     General: Abdomen is flat. There is no distension.  Musculoskeletal:        General: Tenderness present.     Cervical back: Normal range of motion.     Comments: Difficulty when rising from seated position to standing. Good lumbar range of motion. No pain noted with facet loading. 5/5 strength noted with bilateral hip flexion, knee flexion/extension, ankle dorsiflexion/plantarflexion and EHL. No clonus noted bilaterally. No pain upon palpation of greater trochanters. No pain with internal/external rotation of bilateral hips. Sensation intact bilaterally. Negative slump test bilaterally. Ambulates with walking stick, gait slow and unsteady.   Skin:    General: Skin is warm and dry.     Capillary Refill: Capillary refill takes less than 2  seconds.  Neurological:     Mental Status: He is alert and oriented to person, place, and time.     Gait: Gait abnormal.     Ortho Exam  Imaging: No results found.  Past Medical/Family/Surgical/Social History: Medications & Allergies reviewed per EMR, new medications updated. Patient Active Problem List   Diagnosis Date Noted   Ischemic cerebrovascular accident (CVA) (HCC) 10/06/2022   Status post surgery 10/02/2022   Stroke: Multiple scattered bilateral ischemic infarcts s/p thrombectomy of left P1/PCA TICI 3 Etiology: Likely cardioembolic 10/02/2022   Hemianopsia 10/01/2022   Right homonymous hemianopsia 10/01/2022   Loop recorder: [employer] Biomonitor II loop 07/28/2021 07/28/2021    Unsteady gait 08/05/2020   BPH with obstruction/lower urinary tract symptoms 01/21/2020   Anxiety disorder 02/02/2018   Right hip pain 10/02/2017   Recurrent major depression in remission (HCC) 01/27/2017   Chronic kidney disease, stage 3a (HCC) 07/17/2015   History of colonic polyps 03/26/2013   Obesity 10/04/2012   OBSTRUCTIVE SLEEP APNEA 10/30/2008   HYPERLIPIDEMIA-MIXED 07/01/2008   HYPERTENSION, BENIGN 07/01/2008   CAD, NATIVE VESSEL 07/01/2008   FATIGUE / MALAISE 07/01/2008   EDEMA 07/01/2008   Past Medical History:  Diagnosis Date   Acute renal failure (HCC)    secondary to nephrotic syndrome post cath, resolved with steroids   Anxiety    Arthritis    CAD (coronary artery disease) 04/2006   LIMA to LAD, sequential SVG to first, second, and third OM's, sequential SVG to mid RCA and PDA    Depression    Diabetes mellitus without complication (HCC)    ED (erectile dysfunction)    HTN (hypertension)    Hx of adenomatous colonic polyps    Hyperlipidemia    Loop recorder: Biotronic Biomonitor II loop 07/28/2021 07/28/2021   Obesity    Stroke (HCC)    june   Family History  Problem Relation Age of Onset   Heart attack Mother    Clotting disorder Father        died of renal disease   Heart disease Sister    Cancer Sister    Colon cancer Neg Hx    Past Surgical History:  Procedure Laterality Date   COLONOSCOPY W/ BIOPSIES AND POLYPECTOMY     CORONARY ARTERY BYPASS GRAFT  2008   VESSELS X6   IR CT HEAD LTD  10/02/2022   IR PERCUTANEOUS ART THROMBECTOMY/INFUSION INTRACRANIAL INC DIAG ANGIO  10/02/2022   IR US GUIDE VASC ACCESS RIGHT  10/02/2022   LOOP RECORDER IMPLANT     RADIOLOGY WITH ANESTHESIA N/A 10/02/2022   Procedure: IR WITH ANESTHESIA;  Surgeon: Radiologist, Medication, MD;  Location: [facility] OR;  Service: Radiology;  Laterality: N/A;   UMBILICAL HERNIA REPAIR     Social History   Occupational History   Occupation: Field seismologist  Tobacco Use   Smoking  status: Never   Smokeless tobacco: Never  Vaping Use   Vaping status: Never Used  Substance and Sexual Activity   Alcohol use: No    Alcohol/week: 1.0 standard drink of alcohol    Types: 1 Glasses of wine per week   Drug use: No   Sexual activity: Not on file

## 2023-05-08 DIAGNOSIS — C44311 Basal cell carcinoma of skin of nose: Secondary | ICD-10-CM | POA: Diagnosis not present

## 2023-05-08 DIAGNOSIS — Z85828 Personal history of other malignant neoplasm of skin: Secondary | ICD-10-CM | POA: Diagnosis not present

## 2023-05-08 DIAGNOSIS — L821 Other seborrheic keratosis: Secondary | ICD-10-CM | POA: Diagnosis not present

## 2023-05-08 DIAGNOSIS — R229 Localized swelling, mass and lump, unspecified: Secondary | ICD-10-CM | POA: Diagnosis not present

## 2023-05-08 DIAGNOSIS — D485 Neoplasm of uncertain behavior of skin: Secondary | ICD-10-CM | POA: Diagnosis not present

## 2023-05-08 DIAGNOSIS — Z08 Encounter for follow-up examination after completed treatment for malignant neoplasm: Secondary | ICD-10-CM | POA: Diagnosis not present

## 2023-05-08 DIAGNOSIS — L989 Disorder of the skin and subcutaneous tissue, unspecified: Secondary | ICD-10-CM | POA: Diagnosis not present

## 2023-05-08 DIAGNOSIS — D229 Melanocytic nevi, unspecified: Secondary | ICD-10-CM | POA: Diagnosis not present

## 2023-05-10 ENCOUNTER — Ambulatory Visit: Payer: HMO | Admitting: Physical Medicine and Rehabilitation

## 2023-05-18 DIAGNOSIS — I129 Hypertensive chronic kidney disease with stage 1 through stage 4 chronic kidney disease, or unspecified chronic kidney disease: Secondary | ICD-10-CM | POA: Diagnosis not present

## 2023-05-18 DIAGNOSIS — Z794 Long term (current) use of insulin: Secondary | ICD-10-CM | POA: Diagnosis not present

## 2023-05-18 DIAGNOSIS — I25810 Atherosclerosis of coronary artery bypass graft(s) without angina pectoris: Secondary | ICD-10-CM | POA: Diagnosis not present

## 2023-05-18 DIAGNOSIS — N1831 Chronic kidney disease, stage 3a: Secondary | ICD-10-CM | POA: Diagnosis not present

## 2023-05-18 DIAGNOSIS — E1129 Type 2 diabetes mellitus with other diabetic kidney complication: Secondary | ICD-10-CM | POA: Diagnosis not present

## 2023-05-22 ENCOUNTER — Institutional Professional Consult (permissible substitution): Payer: HMO | Admitting: Nurse Practitioner

## 2023-05-22 ENCOUNTER — Ambulatory Visit: Payer: HMO | Admitting: Physical Medicine and Rehabilitation

## 2023-05-22 ENCOUNTER — Encounter: Payer: Self-pay | Admitting: Nurse Practitioner

## 2023-05-22 ENCOUNTER — Other Ambulatory Visit: Payer: Self-pay

## 2023-05-22 DIAGNOSIS — M5416 Radiculopathy, lumbar region: Secondary | ICD-10-CM | POA: Diagnosis not present

## 2023-05-22 DIAGNOSIS — M48062 Spinal stenosis, lumbar region with neurogenic claudication: Secondary | ICD-10-CM

## 2023-05-22 DIAGNOSIS — M25551 Pain in right hip: Secondary | ICD-10-CM

## 2023-05-22 DIAGNOSIS — M25552 Pain in left hip: Secondary | ICD-10-CM | POA: Diagnosis not present

## 2023-05-22 MED ORDER — METHYLPREDNISOLONE ACETATE 40 MG/ML IJ SUSP
40.0000 mg | Freq: Once | INTRAMUSCULAR | Status: AC
Start: 2023-05-22 — End: 2023-05-22
  Administered 2023-05-22: 40 mg

## 2023-05-22 NOTE — Patient Instructions (Signed)

## 2023-05-22 NOTE — Progress Notes (Signed)
 KAMARIE PALMA - 89 y.o. male MRN [id number]  Date of birth: 1934-08-04  Office Visit Note: Visit Date: 05/22/2023 PCP: Cleatis Polka., MD Referred by: Cleatis Polka., MD  Subjective: No chief complaint on file.  HPI:  ZOHAN SHIFLET is a 89 y.o. male who comes in today at the request of Ellin Goodie, FNP for planned Left L4-5 Lumbar Transforaminal epidural steroid injection with fluoroscopic guidance.  The patient has failed conservative care including home exercise, medications, time and activity modification.  This injection will be diagnostic and hopefully therapeutic.  Please see requesting physician notes for further details and justification.  In addition to the planned injection the patient just had a lot of questions about prior injections and his hips particularly the left hip and leg mounds that of the right.  He has a friend present.  I did discuss this with.  Basically he feels like there was an injection performed in our office at some point that helped him greatly and he seems to be very focused on that.  I have looked at iron notes previously and even on the old system from the time prior to [place] joining [facility] and the only injections we have performed have been hip joint injections on the right.  He called in after the previous injection on the right which again was an intra-articular hip joint injection and said that it did not help.  In point of fact however he is hurting on the left side not the right side now and it is going in the hip and leg to a degree.  He does have lumbar stenosis and this has been gone over with him by myself and Ellin Goodie, FNP in our office.  Is somewhat of a loss to explain to him the fact that the injection he is thinking about may not have been performed in our office.  He has been seen by Dr. Romero Belling and I think he has actually been seen at [facility] neurosurgery in the past as well.  His case is also complicated by history of  prior stroke in June of last year.   I spent more than 30 minutes speaking face-to-face with the patient with 50% of the time in counseling and discussing coordination of care.    Review of Systems  Musculoskeletal:  Positive for back pain, joint pain and neck pain.  Neurological:  Positive for tingling and weakness.  All other systems reviewed and are negative.  Otherwise per HPI.  Assessment & Plan: Visit Diagnoses:    ICD-10-CM   1. Lumbar radiculopathy  M54.16 XR C-ARM NO REPORT    Epidural Steroid injection    methylPREDNISolone acetate (DEPO-MEDROL) injection 40 mg    2. Spinal stenosis of lumbar region with neurogenic claudication  M48.062     3. Pain in right hip  M25.551     4. Pain in left hip  M25.552       Plan: Findings:  As noted in the HPI significant confusion over prior injections that have been performed and what is helped and what has not helped.  Fortunately patient is a poor historian and I tried to talk with him at length with this.  He does have significant stenosis and this could be a source of his left hip and leg pain.  We have performed right hip injections in the past with good relief of his right hip pain and point of fact the last injection was on  the right and he is not reporting right hip pain at this point.  Case complicated by prior stroke last year which occurred after initial injections before that.  He has had treatment from other physicians and I think it is getting confusing to him at this point.  We are going to complete the epidural injection today and we will see how he does would consider hip injection on the left if needed.  X-rays of both hips has not shown severe arthritis.    Meds & Orders:  Meds ordered this encounter  Medications   methylPREDNISolone acetate (DEPO-MEDROL) injection 40 mg    Orders Placed This Encounter  Procedures   XR C-ARM NO REPORT   Epidural Steroid injection    Follow-up: Return if symptoms worsen or fail to  improve.   Procedures: No procedures performed  Lumbosacral Transforaminal Epidural Steroid Injection - Sub-Pedicular Approach with Fluoroscopic Guidance  Patient: ADDIE CEDERBERG      Date of Birth: [birth year] MRN: [id number] PCP: Cleatis Polka., MD      Visit Date: 05/22/2023   Universal Protocol:    Date/Time: 05/22/2023  Consent Given By: the patient  Position: PRONE  Additional Comments: Vital signs were monitored before and after the procedure. Patient was prepped and draped in the usual sterile fashion. The correct patient, procedure, and site was verified.   Injection Procedure Details:   Procedure diagnoses: Lumbar radiculopathy [M54.16]    Meds Administered:  Meds ordered this encounter  Medications   methylPREDNISolone acetate (DEPO-MEDROL) injection 40 mg    Laterality: Left  Location/Site: L4  Needle:5.0 in., 22 ga.  Short bevel or Quincke spinal needle  Needle Placement: Transforaminal  Findings:    -Comments: Excellent flow of contrast along the nerve, nerve root and into the epidural space.  Procedure Details: After squaring off the end-plates to get a true AP view, the C-arm was positioned so that an oblique view of the foramen as noted above was visualized. The target area is just inferior to the "nose of the scotty dog" or sub pedicular. The soft tissues overlying this structure were infiltrated with 2-3 ml. of 1% Lidocaine without Epinephrine.  The spinal needle was inserted toward the target using a "trajectory" view along the fluoroscope beam.  Under AP and lateral visualization, the needle was advanced so it did not puncture dura and was located close the 6 O'Clock position of the pedical in AP tracterory. Biplanar projections were used to confirm position. Aspiration was confirmed to be negative for CSF and/or blood. A 1-2 ml. volume of Isovue-250 was injected and flow of contrast was noted at each level. Radiographs were obtained for  documentation purposes.   After attaining the desired flow of contrast documented above, a 0.5 to 1.0 ml test dose of 0.25% Marcaine was injected into each respective transforaminal space.  The patient was observed for 90 seconds post injection.  After no sensory deficits were reported, and normal lower extremity motor function was noted,   the above injectate was administered so that equal amounts of the injectate were placed at each foramen (level) into the transforaminal epidural space.   Additional Comments:  The patient tolerated the procedure well Dressing: 2 x 2 sterile gauze and Band-Aid    Post-procedure details: Patient was observed during the procedure. Post-procedure instructions were reviewed.  Patient left the clinic in stable condition.    Clinical History: EXAM: MRI LUMBAR SPINE WITHOUT CONTRAST   TECHNIQUE: Multiplanar, multisequence MR imaging of  the lumbar spine was performed. No intravenous contrast was administered.   COMPARISON:  Lumbar MRI 12/28/2017   FINDINGS: Segmentation:  Normal   Alignment: 4 mm anterolisthesis L4-5 unchanged. Remaining alignment normal.   Vertebrae:  Negative for fracture or mass.  No bone marrow edema.   Conus medullaris and cauda equina: Conus extends to the L1 level. Conus and cauda equina appear normal.   Paraspinal and other soft tissues: Negative for paraspinous mass or adenopathy. No it soft tissue edema or fluid collection.   Disc levels:   T12-L1: Negative   L1-2: Mild disc and mild facet degeneration. Negative for disc protrusion or stenosis.   L2-3: Diffuse disc bulging and endplate spurring. Bilateral facet hypertrophy. Mild spinal stenosis and moderate subarticular stenosis bilaterally.   L3-4: Disc degeneration with diffuse disc bulging and endplate spurring. Bilateral facet hypertrophy. Mild spinal stenosis and moderate subarticular stenosis bilaterally due to spurring. . Interval resolution of small  synovial cyst on the left.   L4-5: 4 mm anterolisthesis with advanced facet degeneration. Disc degeneration with diffuse disc bulging. Moderate spinal stenosis. Moderate to severe subarticular stenosis bilaterally. No change since the prior MRI.   L5-S1: Disc degeneration with diffuse endplate spurring. Mild facet degeneration. Moderate subarticular and foraminal stenosis on the right. Mild subarticular stenosis on the left.   IMPRESSION: Multilevel degenerative change throughout the lumbar spine similar to the prior MRI. There is resolution of the left-sided small synovial cyst at L3-4. No new or acute findings.     Electronically Signed   By: Marlan Palau M.D.   On: 10/13/2019 09:37     Objective:  VS:  HT:    WT:   BMI:     BP:   HR: bpm  TEMP: ( )  RESP:  Physical Exam Vitals and nursing note reviewed.  Constitutional:      General: He is not in acute distress.    Appearance: Normal appearance. He is not ill-appearing.  HENT:     Head: Normocephalic and atraumatic.     Right Ear: External ear normal.     Left Ear: External ear normal.     Nose: No congestion.  Eyes:     Extraocular Movements: Extraocular movements intact.  Cardiovascular:     Rate and Rhythm: Normal rate.     Pulses: Normal pulses.  Pulmonary:     Effort: Pulmonary effort is normal. No respiratory distress.  Abdominal:     General: There is no distension.     Palpations: Abdomen is soft.  Musculoskeletal:        General: No tenderness or signs of injury.     Cervical back: Neck supple.     Right lower leg: No edema.     Left lower leg: No edema.     Comments: Patient has good distal strength without clonus.  Skin:    Findings: No erythema or rash.  Neurological:     General: No focal deficit present.     Mental Status: He is alert and oriented to person, place, and time.     Cranial Nerves: No cranial nerve deficit.     Sensory: No sensory deficit.     Motor: No weakness or  abnormal muscle tone.     Coordination: Coordination normal.     Gait: Gait abnormal.     Comments: Ambulates with a cane  Psychiatric:        Mood and Affect: Mood normal.        Behavior:  Behavior normal.      Imaging: No results found.

## 2023-05-22 NOTE — Procedures (Signed)
 Lumbosacral Transforaminal Epidural Steroid Injection - Sub-Pedicular Approach with Fluoroscopic Guidance  Patient: [name]      Date of Birth: Apr 30, 1934 MRN: [id number] PCP: Cleatis Polka., MD      Visit Date: 05/22/2023   Universal Protocol:    Date/Time: 05/22/2023  Consent Given By: the patient  Position: PRONE  Additional Comments: Vital signs were monitored before and after the procedure. Patient was prepped and draped in the usual sterile fashion. The correct patient, procedure, and site was verified.   Injection Procedure Details:   Procedure diagnoses: Lumbar radiculopathy [M54.16]    Meds Administered:  Meds ordered this encounter  Medications   methylPREDNISolone acetate (DEPO-MEDROL) injection 40 mg    Laterality: Left  Location/Site: L4  Needle:5.0 in., 22 ga.  Short bevel or Quincke spinal needle  Needle Placement: Transforaminal  Findings:    -Comments: Excellent flow of contrast along the nerve, nerve root and into the epidural space.  Procedure Details: After squaring off the end-plates to get a true AP view, the C-arm was positioned so that an oblique view of the foramen as noted above was visualized. The target area is just inferior to the "nose of the scotty dog" or sub pedicular. The soft tissues overlying this structure were infiltrated with 2-3 ml. of 1% Lidocaine without Epinephrine.  The spinal needle was inserted toward the target using a "trajectory" view along the fluoroscope beam.  Under AP and lateral visualization, the needle was advanced so it did not puncture dura and was located close the 6 O'Clock position of the pedical in AP tracterory. Biplanar projections were used to confirm position. Aspiration was confirmed to be negative for CSF and/or blood. A 1-2 ml. volume of Isovue-250 was injected and flow of contrast was noted at each level. Radiographs were obtained for documentation purposes.   After attaining the desired  flow of contrast documented above, a 0.5 to 1.0 ml test dose of 0.25% Marcaine was injected into each respective transforaminal space.  The patient was observed for 90 seconds post injection.  After no sensory deficits were reported, and normal lower extremity motor function was noted,   the above injectate was administered so that equal amounts of the injectate were placed at each foramen (level) into the transforaminal epidural space.   Additional Comments:  The patient tolerated the procedure well Dressing: 2 x 2 sterile gauze and Band-Aid    Post-procedure details: Patient was observed during the procedure. Post-procedure instructions were reviewed.  Patient left the clinic in stable condition.

## 2023-05-24 DIAGNOSIS — R2681 Unsteadiness on feet: Secondary | ICD-10-CM | POA: Diagnosis not present

## 2023-05-25 ENCOUNTER — Ambulatory Visit (INDEPENDENT_AMBULATORY_CARE_PROVIDER_SITE_OTHER): Payer: HMO

## 2023-05-25 ENCOUNTER — Telehealth: Payer: Self-pay

## 2023-05-25 DIAGNOSIS — R55 Syncope and collapse: Secondary | ICD-10-CM

## 2023-05-25 NOTE — Telephone Encounter (Signed)
 Patient called stating that he is still in pain and that the injection is not working.  Stated that the pain was in his left leg, but now the pain is in his left foot and he can hardly walk.  Would like a call back at [phone].  Please advise.  Thank you.

## 2023-05-26 LAB — CUP PACEART REMOTE DEVICE CHECK
Date Time Interrogation Session: 20250205082454
Implantable Pulse Generator Implant Date: 20230812
Pulse Gen Model: 436066
Pulse Gen Serial Number: 94081109

## 2023-05-29 ENCOUNTER — Encounter: Payer: Self-pay | Admitting: Physical Medicine and Rehabilitation

## 2023-05-29 ENCOUNTER — Ambulatory Visit: Payer: HMO | Admitting: Physical Medicine and Rehabilitation

## 2023-05-29 ENCOUNTER — Telehealth: Payer: Self-pay | Admitting: Physical Medicine and Rehabilitation

## 2023-05-29 DIAGNOSIS — M5416 Radiculopathy, lumbar region: Secondary | ICD-10-CM

## 2023-05-29 DIAGNOSIS — M4316 Spondylolisthesis, lumbar region: Secondary | ICD-10-CM

## 2023-05-29 DIAGNOSIS — I639 Cerebral infarction, unspecified: Secondary | ICD-10-CM | POA: Diagnosis not present

## 2023-05-29 DIAGNOSIS — R2681 Unsteadiness on feet: Secondary | ICD-10-CM | POA: Diagnosis not present

## 2023-05-29 NOTE — Progress Notes (Signed)
 He fell Friday. He is very sore He stated things are worse now. He stated he can't walk without his walking stick now.

## 2023-05-29 NOTE — Progress Notes (Signed)
 [name] - 89 y.o. male MRN [id number]  Date of birth: [birth year]  Office Visit Note: Visit Date: 05/29/2023 PCP: Cleatis Polka., MD Referred by: Cleatis Polka., MD  Subjective: Chief Complaint  Patient presents with   Lower Back - Pain   HPI: [name] is a 89 y.o. male who comes in today for evaluation of chronic, worsening and severe pain to bilateral upper legs, his pain is more to lateral thighs. No lower back pain at this time. Patient is somewhat of a poor historian. Pain ongoing for several years, worsens with movement and activity. He describes pain as sore and aching, currently rates as 4 out of 10. History of formal physical therapy with [facility], good relief of pain with these treatments. Lumbar MRI imaging from 2021 exhibits 4 mm anterolisthesis with advanced facet degeneration and moderate spinal canal stenosis at L4-L5. He reports increased bilateral leg weakness over the last several weeks, sustained mechanical fall at home on Friday, no injuries noted. He has undergone multiple intermittent right intra-articular hip injections in our office with significant relief of pain. He underwent right intra-articular hip injection on 04/26/2023, no relief of pain with this procedure. He also underwent left L4 transforaminal epidural steroid injection in our office on 05/21/2022, no relief of pain with this procedure. There has been some confusion surrounding relief of pain with injections, I do feel patient is poor historian and it is sometimes difficult for him to remember injections that we have performed. History of lumbar epidural steroid injections with Dr. Romero Belling several years ago with minimal relief of pain. Patient currently using walking stick to assist with ambulation.   History of CVA in 2024. Issues with generalized weakness, gait and balance post stroke. Has seen Dr. Carlis Abbott with [facility] in the past.      Review of  Systems  Musculoskeletal:  Positive for back pain and falls.  Neurological:  Positive for weakness. Negative for tingling and sensory change.  All other systems reviewed and are negative.  Otherwise per HPI.  Assessment & Plan: Visit Diagnoses:    ICD-10-CM   1. Lumbar radiculopathy  M54.16     2. Spondylolisthesis of lumbar region  M43.16     3. Unsteady gait  R26.81     4. Ischemic cerebrovascular accident (CVA) (HCC)  I63.9        Plan: Findings:  Chronic, worsening and severe pain to bilateral lateral thighs. No lower back pain, no pain to groin regions. Patient continues to have severe pain despite good conservative therapies such as formal physical therapy, home exercise regimen, rest and use of medications. Patients clinical presentation and exam are complex, his symptoms do seem to be more radicular in nature. Minimal relief of pain of prior right hip injections and lumbar epidural steroid injections. Given his recent fall and failed interventional procedures next step is to place order for new lumbar MRI imaging. We will have him follow up for lumbar MRI review. We did consider interlaminar approach, however he is taking Eliquis post stroke and feel he would not be able to discontinue this medication for injection. Depending on results of lumbar MRI imaging would consider referral to comprehensive pain management. Would recommend patient continue with formal physical therapy as tolerated. No red flag symptoms noted upon exam today.     Meds & Orders: No orders of the defined types were placed in this encounter.  No orders of  the defined types were placed in this encounter.   Follow-up: Return for Lumbar MRI review.   Procedures: No procedures performed      Clinical History: EXAM: MRI LUMBAR SPINE WITHOUT CONTRAST   TECHNIQUE: Multiplanar, multisequence MR imaging of the lumbar spine was performed. No intravenous contrast was administered.   COMPARISON:  Lumbar MRI  12/28/2017   FINDINGS: Segmentation:  Normal   Alignment: 4 mm anterolisthesis L4-5 unchanged. Remaining alignment normal.   Vertebrae:  Negative for fracture or mass.  No bone marrow edema.   Conus medullaris and cauda equina: Conus extends to the L1 level. Conus and cauda equina appear normal.   Paraspinal and other soft tissues: Negative for paraspinous mass or adenopathy. No it soft tissue edema or fluid collection.   Disc levels:   T12-L1: Negative   L1-2: Mild disc and mild facet degeneration. Negative for disc protrusion or stenosis.   L2-3: Diffuse disc bulging and endplate spurring. Bilateral facet hypertrophy. Mild spinal stenosis and moderate subarticular stenosis bilaterally.   L3-4: Disc degeneration with diffuse disc bulging and endplate spurring. Bilateral facet hypertrophy. Mild spinal stenosis and moderate subarticular stenosis bilaterally due to spurring. . Interval resolution of small synovial cyst on the left.   L4-5: 4 mm anterolisthesis with advanced facet degeneration. Disc degeneration with diffuse disc bulging. Moderate spinal stenosis. Moderate to severe subarticular stenosis bilaterally. No change since the prior MRI.   L5-S1: Disc degeneration with diffuse endplate spurring. Mild facet degeneration. Moderate subarticular and foraminal stenosis on the right. Mild subarticular stenosis on the left.   IMPRESSION: Multilevel degenerative change throughout the lumbar spine similar to the prior MRI. There is resolution of the left-sided small synovial cyst at L3-4. No new or acute findings.     Electronically Signed   By: Marlan Palau M.D.   On: 10/13/2019 09:37   He reports that he has never smoked. He has never used smokeless tobacco.  Recent Labs    10/02/22 0227  HGBA1C 8.4*    Objective:  VS:  HT:    WT:   BMI:     BP:   HR: bpm  TEMP: ( )  RESP:  Physical Exam Vitals reviewed.  HENT:     Head: Normocephalic and  atraumatic.     Right Ear: External ear normal.     Left Ear: External ear normal.     Nose: Nose normal.     Mouth/Throat:     Mouth: Mucous membranes are moist.  Eyes:     Extraocular Movements: Extraocular movements intact.  Cardiovascular:     Rate and Rhythm: Normal rate.     Pulses: Normal pulses.  Pulmonary:     Effort: Pulmonary effort is normal.  Abdominal:     General: Abdomen is flat. There is no distension.  Musculoskeletal:        General: Tenderness present.     Cervical back: Normal range of motion.     Comments: Patient is slow to rise from seated to standing position. Good lumbar range of motion. No pain noted with facet loading. 5/5 strength noted with bilateral hip flexion, knee flexion/extension, ankle dorsiflexion/plantarflexion and EHL. No foot drop noted. No clonus noted bilaterally. No pain upon palpation of greater trochanters. No pain with internal/external rotation of bilateral hips. Sensation intact bilaterally. Bruising noted to right lateral thigh. Negative slump test bilaterally. Ambulates with walking stick, gait slow and unsteady.   Skin:    General: Skin is warm and dry.  Capillary Refill: Capillary refill takes less than 2 seconds.  Neurological:     Mental Status: He is alert and oriented to person, place, and time.     Motor: Weakness present.     Gait: Gait abnormal.  Psychiatric:        Mood and Affect: Mood normal.        Behavior: Behavior normal.     Ortho Exam  Imaging: No results found.  Past Medical/Family/Surgical/Social History: Medications & Allergies reviewed per EMR, new medications updated. Patient Active Problem List   Diagnosis Date Noted   Ischemic cerebrovascular accident (CVA) (HCC) 10/06/2022   Status post surgery 10/02/2022   Stroke: Multiple scattered bilateral ischemic infarcts s/p thrombectomy of left P1/PCA TICI 3 Etiology: Likely cardioembolic 10/02/2022   Hemianopsia 10/01/2022   Right homonymous  hemianopsia 10/01/2022   Loop recorder: Biotronic Biomonitor II loop 07/28/2021 07/28/2021   Unsteady gait 08/05/2020   BPH with obstruction/lower urinary tract symptoms 01/21/2020   Anxiety disorder 02/02/2018   Right hip pain 10/02/2017   Recurrent major depression in remission (HCC) 01/27/2017   Chronic kidney disease, stage 3a (HCC) 07/17/2015   History of colonic polyps 03/26/2013   Obesity 10/04/2012   OBSTRUCTIVE SLEEP APNEA 10/30/2008   HYPERLIPIDEMIA-MIXED 07/01/2008   HYPERTENSION, BENIGN 07/01/2008   CAD, NATIVE VESSEL 07/01/2008   FATIGUE / MALAISE 07/01/2008   EDEMA 07/01/2008   Past Medical History:  Diagnosis Date   Acute renal failure (HCC)    secondary to nephrotic syndrome post cath, resolved with steroids   Anxiety    Arthritis    CAD (coronary artery disease) 04/2006   LIMA to LAD, sequential SVG to first, second, and third OM's, sequential SVG to mid RCA and PDA    Depression    Diabetes mellitus without complication (HCC)    ED (erectile dysfunction)    HTN (hypertension)    Hx of adenomatous colonic polyps    Hyperlipidemia    Loop recorder: Biotronic Biomonitor II loop 07/28/2021 07/28/2021   Obesity    Stroke (HCC)    june   Family History  Problem Relation Age of Onset   Heart attack Mother    Clotting disorder Father        died of renal disease   Heart disease Sister    Cancer Sister    Colon cancer Neg Hx    Past Surgical History:  Procedure Laterality Date   COLONOSCOPY W/ BIOPSIES AND POLYPECTOMY     CORONARY ARTERY BYPASS GRAFT  2008   VESSELS X6   IR CT HEAD LTD  10/02/2022   IR PERCUTANEOUS ART THROMBECTOMY/INFUSION INTRACRANIAL INC DIAG ANGIO  10/02/2022   IR US GUIDE VASC ACCESS RIGHT  10/02/2022   LOOP RECORDER IMPLANT     RADIOLOGY WITH ANESTHESIA N/A 10/02/2022   Procedure: IR WITH ANESTHESIA;  Surgeon: Radiologist, Medication, MD;  Location: [facility] OR;  Service: Radiology;  Laterality: N/A;   UMBILICAL HERNIA REPAIR     Social  History   Occupational History   Occupation: Field seismologist  Tobacco Use   Smoking status: Never   Smokeless tobacco: Never  Vaping Use   Vaping status: Never Used  Substance and Sexual Activity   Alcohol use: No    Alcohol/week: 1.0 standard drink of alcohol    Types: 1 Glasses of wine per week   Drug use: No   Sexual activity: Not on file

## 2023-05-29 NOTE — Telephone Encounter (Signed)
 Pt called requesting PA Megan call the MRI facility to see if he can be seen sooner than 2/27. Pt states he did it before with another dr. I explained to pt that it is not always the case. We dont control Imaging appt dates and that I would send this message to Megan to see what she can do. Pt states he can barley walk and really would like a sooner imaging appt. Please call pt about this matter at [phone].

## 2023-06-01 NOTE — Progress Notes (Signed)
[employer]

## 2023-06-01 NOTE — Addendum Note (Signed)
Addended by: Geralyn Flash D on: 06/01/2023 10:56 AM   Modules accepted: Orders

## 2023-06-04 ENCOUNTER — Ambulatory Visit: Payer: HMO

## 2023-06-04 DIAGNOSIS — M47816 Spondylosis without myelopathy or radiculopathy, lumbar region: Secondary | ICD-10-CM | POA: Diagnosis not present

## 2023-06-04 DIAGNOSIS — M47817 Spondylosis without myelopathy or radiculopathy, lumbosacral region: Secondary | ICD-10-CM | POA: Diagnosis not present

## 2023-06-04 DIAGNOSIS — I639 Cerebral infarction, unspecified: Secondary | ICD-10-CM

## 2023-06-04 DIAGNOSIS — M4316 Spondylolisthesis, lumbar region: Secondary | ICD-10-CM | POA: Diagnosis not present

## 2023-06-04 DIAGNOSIS — M5136 Other intervertebral disc degeneration, lumbar region with discogenic back pain only: Secondary | ICD-10-CM | POA: Diagnosis not present

## 2023-06-04 DIAGNOSIS — R2681 Unsteadiness on feet: Secondary | ICD-10-CM

## 2023-06-04 DIAGNOSIS — M48061 Spinal stenosis, lumbar region without neurogenic claudication: Secondary | ICD-10-CM

## 2023-06-04 DIAGNOSIS — M5416 Radiculopathy, lumbar region: Secondary | ICD-10-CM

## 2023-06-10 ENCOUNTER — Encounter: Payer: Self-pay | Admitting: Physical Medicine and Rehabilitation

## 2023-06-12 DIAGNOSIS — Z029 Encounter for administrative examinations, unspecified: Secondary | ICD-10-CM | POA: Diagnosis not present

## 2023-06-12 DIAGNOSIS — I480 Paroxysmal atrial fibrillation: Secondary | ICD-10-CM | POA: Diagnosis not present

## 2023-06-12 DIAGNOSIS — E1129 Type 2 diabetes mellitus with other diabetic kidney complication: Secondary | ICD-10-CM | POA: Diagnosis not present

## 2023-06-12 DIAGNOSIS — I129 Hypertensive chronic kidney disease with stage 1 through stage 4 chronic kidney disease, or unspecified chronic kidney disease: Secondary | ICD-10-CM | POA: Diagnosis not present

## 2023-06-12 DIAGNOSIS — I700 Atherosclerosis of aorta: Secondary | ICD-10-CM | POA: Diagnosis not present

## 2023-06-12 DIAGNOSIS — R600 Localized edema: Secondary | ICD-10-CM | POA: Diagnosis not present

## 2023-06-12 DIAGNOSIS — N1831 Chronic kidney disease, stage 3a: Secondary | ICD-10-CM | POA: Diagnosis not present

## 2023-06-12 DIAGNOSIS — G3184 Mild cognitive impairment, so stated: Secondary | ICD-10-CM | POA: Diagnosis not present

## 2023-06-12 DIAGNOSIS — I25810 Atherosclerosis of coronary artery bypass graft(s) without angina pectoris: Secondary | ICD-10-CM | POA: Diagnosis not present

## 2023-06-12 DIAGNOSIS — D6869 Other thrombophilia: Secondary | ICD-10-CM | POA: Diagnosis not present

## 2023-06-12 DIAGNOSIS — F3341 Major depressive disorder, recurrent, in partial remission: Secondary | ICD-10-CM | POA: Diagnosis not present

## 2023-06-13 DIAGNOSIS — M9902 Segmental and somatic dysfunction of thoracic region: Secondary | ICD-10-CM | POA: Diagnosis not present

## 2023-06-13 DIAGNOSIS — M9901 Segmental and somatic dysfunction of cervical region: Secondary | ICD-10-CM | POA: Diagnosis not present

## 2023-06-13 DIAGNOSIS — M9903 Segmental and somatic dysfunction of lumbar region: Secondary | ICD-10-CM | POA: Diagnosis not present

## 2023-06-15 ENCOUNTER — Other Ambulatory Visit: Payer: HMO

## 2023-06-15 DIAGNOSIS — C44311 Basal cell carcinoma of skin of nose: Secondary | ICD-10-CM | POA: Diagnosis not present

## 2023-06-20 ENCOUNTER — Encounter: Payer: HMO | Attending: Registered Nurse | Admitting: Physical Medicine and Rehabilitation

## 2023-06-20 DIAGNOSIS — R29898 Other symptoms and signs involving the musculoskeletal system: Secondary | ICD-10-CM | POA: Insufficient documentation

## 2023-06-26 ENCOUNTER — Ambulatory Visit: Payer: HMO

## 2023-06-26 DIAGNOSIS — I4819 Other persistent atrial fibrillation: Secondary | ICD-10-CM | POA: Diagnosis not present

## 2023-06-26 LAB — CUP PACEART REMOTE DEVICE CHECK
Date Time Interrogation Session: 20250310114030
Implantable Pulse Generator Implant Date: 20230812
Pulse Gen Model: 436066
Pulse Gen Serial Number: 94081109

## 2023-06-27 ENCOUNTER — Encounter (HOSPITAL_BASED_OUTPATIENT_CLINIC_OR_DEPARTMENT_OTHER)

## 2023-06-27 DIAGNOSIS — I5032 Chronic diastolic (congestive) heart failure: Secondary | ICD-10-CM | POA: Diagnosis not present

## 2023-06-27 DIAGNOSIS — I480 Paroxysmal atrial fibrillation: Secondary | ICD-10-CM | POA: Diagnosis not present

## 2023-06-27 DIAGNOSIS — I110 Hypertensive heart disease with heart failure: Secondary | ICD-10-CM | POA: Diagnosis not present

## 2023-06-27 DIAGNOSIS — R600 Localized edema: Secondary | ICD-10-CM | POA: Diagnosis not present

## 2023-06-28 ENCOUNTER — Ambulatory Visit: Payer: Self-pay | Admitting: Cardiology

## 2023-06-28 ENCOUNTER — Encounter: Payer: Self-pay | Admitting: Cardiology

## 2023-06-28 ENCOUNTER — Ambulatory Visit
Admission: RE | Admit: 2023-06-28 | Discharge: 2023-06-28 | Disposition: A | Discharge status: Home / Self Care | Source: Ambulatory Visit | Attending: Vascular Surgery | Admitting: Vascular Surgery

## 2023-06-28 ENCOUNTER — Other Ambulatory Visit (HOSPITAL_COMMUNITY): Payer: Self-pay | Admitting: Nurse Practitioner

## 2023-06-28 ENCOUNTER — Ambulatory Visit: Payer: HMO | Admitting: Primary Care

## 2023-06-28 ENCOUNTER — Telehealth: Payer: Self-pay | Admitting: Physical Medicine and Rehabilitation

## 2023-06-28 VITALS — BP 124/64 | HR 83 | Resp 16 | Ht 68.0 in | Wt 176.0 lb

## 2023-06-28 VITALS — BP 128/62 | HR 70 | Temp 98.0°F | Ht 68.0 in | Wt 175.8 lb

## 2023-06-28 DIAGNOSIS — I480 Paroxysmal atrial fibrillation: Secondary | ICD-10-CM | POA: Diagnosis not present

## 2023-06-28 DIAGNOSIS — Z4509 Encounter for adjustment and management of other cardiac device: Secondary | ICD-10-CM | POA: Diagnosis not present

## 2023-06-28 DIAGNOSIS — Z95818 Presence of other cardiac implants and grafts: Secondary | ICD-10-CM

## 2023-06-28 DIAGNOSIS — M7989 Other specified soft tissue disorders: Secondary | ICD-10-CM

## 2023-06-28 DIAGNOSIS — I10 Essential (primary) hypertension: Secondary | ICD-10-CM

## 2023-06-28 DIAGNOSIS — I5032 Chronic diastolic (congestive) heart failure: Secondary | ICD-10-CM

## 2023-06-28 DIAGNOSIS — F32A Depression, unspecified: Secondary | ICD-10-CM

## 2023-06-28 DIAGNOSIS — G4733 Obstructive sleep apnea (adult) (pediatric): Secondary | ICD-10-CM

## 2023-06-28 NOTE — Progress Notes (Signed)
 Cardiology Office Note:  .   Date:  06/28/2023  ID:  [name], DOB November 09, 1934, MRN [id number] PCP: Cleatis Polka., MD  [facility] Providers Cardiologist:  Yates Decamp, MD   History of Present Illness: .   [name] is a 89 y.o. history of CAD s/p CABG  in 2008 with LIMA to LAD, sequential saphenous vein graft to first second and third obtuse marginals, and sequential saphenous vein graft to the RCA and right PDA. He has history of hypertension, hyperlipidemia, diabetes mellitus with stage IIIa chronic kidney disease, GERD and esophageal and gastric ulcers diagnosed in Jan 2023 for dysphagia.  He has not had any bleeding diathesis and doing well.  Due to recurrent syncope underwent loop recorder plantation on 07/28/2021.    Patient presented on 10/01/2022 with visual disturbances, has this to her left facial droop, trace left arm weakness, left hemianopia and found to have multiple scattered bilateral ischemic infarcts status post thrombectomy of the left P1 segment of the posterior cerebral artery.  Patient upon admission was found to have atypical atrial flutter.   Discussed the use of AI scribe software for clinical note transcription with the patient, who gave verbal consent to proceed.  History of Present Illness   [name], a patient with a history of cardiac conditions and diabetes, presents with leg swelling that started about three weeks ago. The swelling is predominantly in the left leg and does not improve with furosemide, which the patient takes daily. The patient also mentions a sore big toe on the left foot, but it is unclear if this is related to the leg swelling or a separate issue. The patient recently experienced diarrhea, which occurred twice in one day. The patient does not believe it is related to the furosemide. The patient's cardiac condition is monitored with a device, which has been showing brief episodes of AFib. The patient is on anticoagulation for AF.  The patient also has diabetes, which is not well controlled. The patient has help at home from a retired Engineer, civil (consulting) and another helper.      Labs   Lab Results  Component Value Date   CHOL 136 10/02/2022   HDL 45 10/02/2022   LDLCALC 77 10/02/2022   TRIG 68 10/02/2022   CHOLHDL 3.0 10/02/2022   Lab Results  Component Value Date   NA 139 12/07/2022   K 4.9 12/07/2022   CO2 26 12/07/2022   GLUCOSE 282 (H) 12/07/2022   BUN 42 (H) 12/07/2022   CREATININE 1.54 (H) 12/07/2022   CALCIUM 9.7 12/07/2022   EGFR 44 (L) 12/07/2022   GFRNONAA 43 (L) 11/29/2022      Latest Ref Rng & Units 12/07/2022   11:16 AM 11/29/2022    9:53 AM 10/10/2022    5:58 AM  BMP  Glucose 70 - 99 mg/dL 474  259  563   BUN 8 - 27 mg/dL 42  27  18   Creatinine 0.76 - 1.27 mg/dL 8.75  6.43  3.29   BUN/Creat Ratio 10 - 24 27     Sodium 134 - 144 mmol/L 139  132  133   Potassium 3.5 - 5.2 mmol/L 4.9  4.8  3.5   Chloride 96 - 106 mmol/L 98  99  101   CO2 20 - 29 mmol/L 26  24  22    Calcium 8.6 - 10.2 mg/dL 9.7  9.1  8.5       Latest Ref Rng & Units 12/07/2022  11:17 AM 11/29/2022    9:53 AM 10/11/2022    6:10 AM  CBC  WBC 3.4 - 10.8 x10E3/uL 10.2  10.5  13.1   Hemoglobin 13.0 - 17.7 g/dL 16.1  09.6  04.5   Hematocrit 37.5 - 51.0 % 42.5  39.4  35.9   Platelets 150 - 450 x10E3/uL 298  157  216    Lab Results  Component Value Date   HGBA1C 8.4 (H) 10/02/2022    No results found for: "TSH"   External Labs:  [facility] labs from PCP  A1c 8.2% on 05/18/2023.  TSH normal at 1.060 on 11/15/2022.  Review of Systems  Cardiovascular:  Positive for leg swelling. Negative for chest pain and dyspnea on exertion.   Physical Exam:   VS:  BP 124/64 (BP Location: Left Arm, Patient Position: Sitting, Cuff Size: Normal)   Pulse 83   Resp 16   Ht 5\' 8"  (1.727 m)   Wt 176 lb (79.8 kg)   SpO2 98%   BMI 26.76 kg/m    Wt Readings from Last 3 Encounters:  06/28/23 176 lb (79.8 kg)  06/28/23 175 lb 12.8 oz (79.7 kg)   03/20/23 181 lb (82.1 kg)    Physical Exam Neck:     Vascular: No carotid bruit or JVD.  Cardiovascular:     Rate and Rhythm: Normal rate and regular rhythm.     Pulses: Intact distal pulses.     Heart sounds: Normal heart sounds. No murmur heard.    No gallop.  Pulmonary:     Effort: Pulmonary effort is normal.     Breath sounds: Normal breath sounds.  Abdominal:     General: Bowel sounds are normal.     Palpations: Abdomen is soft.  Musculoskeletal:     Right lower leg: Edema (ankle 2+ pitting) present.     Left lower leg: Edema (2 + below knee pittting) present.    Studies Reviewed: Marland Kitchen    ECHO COMPLETE WITH IMAGING ENHANCING AGENT 10/02/2022 1. Left ventricular ejection fraction, by estimation, is 60 to 65%. The left ventricle has normal function. The left ventricle has no regional wall motion abnormalities. Left ventricular diastolic parameters are indeterminate. 2. Right ventricular systolic function is mildly reduced. The right ventricular size is normal. Tricuspid regurgitation signal is inadequate for assessing PA pressure. The estimated right ventricular systolic pressure is 37.8 mmHg. 3. Left atrial size was mild to moderately dilated. 4. The mitral valve is degenerative. Mild mitral valve regurgitation. No evidence of mitral stenosis. 5. The aortic valve is grossly normal. There is mild calcification of the aortic valve. There is mild thickening of the aortic valve. Aortic valve regurgitation is not visualized. No aortic stenosis is present. 6. The inferior vena cava is dilated in size with <50% respiratory variability, suggesting right atrial pressure of 15 mmHg.  Loop recorder: Biotronic Biomonitor II loop 07/28/2021 for syncope   Program Change: Turned on Sensing console and T-wave supression due to oversensing. Turn of AF alert/Transmission. Will continue to follow AF burden monthly.   AF burden 10-13%  EKG:    EKG 12/28/2022: Atrial fibrillation with controlled  ventricular response of rate of 94 bpm, inferior infarct old. No evidence of ischemia. Compared to 06/29/2022, no significant change.   Medications and allergies    Allergies  Allergen Reactions   Ketorolac Tromethamine Other (See Comments)    nephrotic syndrome     Current Outpatient Medications:    acebutolol (SECTRAL) 200 MG capsule, Take 1  capsule (200 mg total) by mouth 2 (two) times daily., Disp: 180 capsule, Rfl: 1   acetaminophen (TYLENOL) 325 MG tablet, Take 2 tablets (650 mg total) by mouth every 4 (four) hours as needed for mild pain (or temp > 37.5 C (99.5 F))., Disp: , Rfl:    apixaban (ELIQUIS) 5 MG TABS tablet, Take 5 mg by mouth 2 (two) times daily., Disp: , Rfl:    atorvastatin (LIPITOR) 80 MG tablet, Take 1 tablet (80 mg total) by mouth daily., Disp: 30 tablet, Rfl: 0   empagliflozin (JARDIANCE) 25 MG TABS tablet, Take 1 tablet (25 mg total) by mouth daily., Disp: 30 tablet, Rfl: 0   escitalopram (LEXAPRO) 10 MG tablet, Take 10 mg by mouth daily., Disp: , Rfl:    ezetimibe (ZETIA) 10 MG tablet, Take 10 mg by mouth daily., Disp: , Rfl:    furosemide (LASIX) 40 MG tablet, Take 1 tablet (40 mg total) by mouth daily as needed for edema., Disp: , Rfl:    glucose blood test strip, USE 1 STRIP TO CHECK GLUCOSE 2 TO 4 TIMES DAILY, Disp: , Rfl:    METFORMIN HCL PO, , Disp: , Rfl:    mirabegron ER (MYRBETRIQ) 50 MG TB24 tablet, Take 1 tablet (50 mg total) by mouth daily., Disp: 30 tablet, Rfl: 0   mupirocin ointment (BACTROBAN) 2 %, Apply 1 Application topically 2 (two) times daily., Disp: , Rfl:    olmesartan (BENICAR) 20 MG tablet, Take 1 tablet (20 mg total) by mouth daily., Disp: 30 tablet, Rfl: 0   ONETOUCH VERIO test strip, 1 each by Other route daily., Disp: , Rfl:    tadalafil (CIALIS) 5 MG tablet, Take 1 tablet (5 mg total) by mouth daily., Disp: 10 tablet, Rfl: 0   tamsulosin (FLOMAX) 0.4 MG CAPS capsule, Take 1 capsule (0.4 mg total) by mouth daily after supper., Disp: 30  capsule, Rfl: 0   TRESIBA FLEXTOUCH 200 UNIT/ML FlexTouch Pen, Inject into the skin., Disp: , Rfl:    Vitamin D, Ergocalciferol, (DRISDOL) 1.25 MG (50000 UNIT) CAPS capsule, Take 1 capsule (50,000 Units total) by mouth every 7 (seven) days., Disp: 7 capsule, Rfl: 0   ASSESSMENT AND PLAN: .      ICD-10-CM   1. Encounter for loop recorder check  Z45.09     2. Loop recorder: Biotronic Biomonitor II loop 07/28/2021  Z95.818     3. Paroxysmal atrial fibrillation (HCC)  I48.0     4. Primary hypertension  I10     5. Chronic diastolic heart failure (HCC)  W29.56       1. Encounter for loop recorder check (Primary)   2. Loop recorder: Biotronic Biomonitor II loop 07/28/2021 Patient having frequent false High Rate episodes, Turned off T wave oversensing and turned off A. Fib detection as he is on chronic anticoagulation and AF is asymptomatic.  3. Paroxysmal atrial fibrillation (HCC) Today in sinus and on anticoagulation with Eliquis. Paroxysmal atrial fibrillation is monitored via a device, showing brief episodes while maintaining a regular sinus rhythm. The device was initially set to detect episodes longer than ten minutes due to frequent false episodes caused by PACs and PVCs. He is on anticoagulation therapy with Eliquis. The decision was made to turn off AFib alerts and only monitor the burden of AFib, as he is already on anticoagulation and the episodes are brief. T wave super sensing turned off to reduce false episodes. Continue anticoagulation with Eliquis, turn off AFib alerts, and monitor AFib  burden only. Implement T wave suppression sensing consult to reduce false episodes.  4. Primary hypertension BP is well controlled   5. Chronic diastolic heart failure (HCC) Patient to continue using furosemide daily and to increase it to twice daily dosing when he has worsening leg edema.  Diet discussed, unfortunately due to patient being outside he continues to have worsening leg edema.  He  is now 89 years of age and continues to remain fairly active.  I will see him back in a year or sooner if problems.  6. Leg Edema   Persistent leg swelling, primarily in the left leg, has been present for about three weeks. The swelling does not improve with furosemide (Lasix) and is likely exacerbated by dietary salt intake. A history of bypass surgery may contribute to the differential swelling. There is no indication of a blood clot, and the swelling is attributed to dietary habits and possibly his sitting position. Continue furosemide daily and increase to BID dosing during periods of worsening edema. Advise on reducing dietary salt intake to manage edema.  7. CAD Stable without angina  8. Diabetes Mellitus   Diabetes is not well controlled, as indicated by the persistent soreness in the big toe, which may be related to diabetic neuropathy. Circulation is good, but there is a need for better diabetes management. Monitor blood glucose levels and adjust diabetes management as needed.           Signed,  Yates Decamp, MD, [facility] 06/28/2023, 8:00 PM [facility] [address] [place], [place] [address] Phone: [phone]. Fax:  [phone]

## 2023-06-28 NOTE — Telephone Encounter (Signed)
 Patient called would like a call back to schedule with [facility].

## 2023-06-28 NOTE — Progress Notes (Signed)
 @Patient  ID: [name], male    DOB: April 01, 1935, 89 y.o.   MRN: [id number]  Chief Complaint  Patient presents with   Consult    Sleep Consult-Ref by Dr. Orvan Seen sleeping well    Referring provider: Cleatis Polka., MD  HPI: 89 year old male, never smoked.  Past medical history significant for coronary artery disease, hypertension, chronic diastolic heart failure, CVA, paroxysmal A-fib, chronic kidney disease, OSA, hyperlipidemia.  06/28/2023 Discussed the use of AI scribe software for clinical note transcription with the patient, who gave verbal consent to proceed.  History of Present Illness   The patient, with mild sleep apnea, presents with sleep disturbances. He was referred by Dr. Clelia Croft for evaluation of sleep disturbances.  He experiences difficulty with sleep onset, often not going to bed until 2 AM, although he falls asleep quickly once in bed. No frequent awakenings during the night, and he generally sleeps through the night, occasionally waking around 6 AM to use the bathroom before returning to sleep. He typically starts his day around 10 AM, estimating he gets about seven to eight hours of sleep.  He had a sleep study in 2010, which indicated mild sleep apnea. No current symptoms of snoring, waking up gasping or choking, and he does not believe his sleep impacts his daily functioning.  He describes his mood as fluctuating rapidly from 'glad to sad' and identifies more with feelings of depression. He is unsure if he is currently taking Lexapro (escitalopram) for mood management, but it is suggested that he might be on a 10 mg dose.  Socially, he is retired since 2000, having previously worked as a Nurse, adult. He maintains regular contact with his family, having lunch with his two sons almost daily and receiving morning calls from his daughter.       Allergies  Allergen Reactions   Ketorolac Tromethamine Other (See Comments)    nephrotic  syndrome    Immunization History  Administered Date(s) Administered   Influenza, Quadrivalent, Recombinant, Inj, Pf 12/21/2017, 01/15/2019, 01/24/2019, 01/29/2020   Influenza,inj,Quad PF,6+ Mos 02/22/2016, 01/27/2017   PFIZER(Purple Top)SARS-COV-2 Vaccination 04/25/2019, 05/14/2019, 01/21/2020   Pneumococcal Polysaccharide-23 05/10/2012, 05/15/2013   Td 04/18/2000, 10/02/2008, 05/15/2013    Past Medical History:  Diagnosis Date   Acute renal failure (HCC)    secondary to nephrotic syndrome post cath, resolved with steroids   Anxiety    Arthritis    CAD (coronary artery disease) 04/2006   LIMA to LAD, sequential SVG to first, second, and third OM's, sequential SVG to mid RCA and PDA    Depression    Diabetes mellitus without complication (HCC)    ED (erectile dysfunction)    HTN (hypertension)    Hx of adenomatous colonic polyps    Hyperlipidemia    Loop recorder: Biotronic Biomonitor II loop 07/28/2021 07/28/2021   Obesity    Stroke (HCC)    june    Tobacco History: Social History   Tobacco Use  Smoking Status Never  Smokeless Tobacco Never   Counseling given: Not Answered   Outpatient Medications Prior to Visit  Medication Sig Dispense Refill   acebutolol (SECTRAL) 200 MG capsule Take 1 capsule (200 mg total) by mouth 2 (two) times daily. 180 capsule 1   acetaminophen (TYLENOL) 325 MG tablet Take 2 tablets (650 mg total) by mouth every 4 (four) hours as needed for mild pain (or temp > 37.5 C (99.5 F)).     apixaban (ELIQUIS) 5 MG TABS tablet  Take 5 mg by mouth 2 (two) times daily.     atorvastatin (LIPITOR) 80 MG tablet Take 1 tablet (80 mg total) by mouth daily. 30 tablet 0   empagliflozin (JARDIANCE) 25 MG TABS tablet Take 1 tablet (25 mg total) by mouth daily. 30 tablet 0   escitalopram (LEXAPRO) 10 MG tablet Take 10 mg by mouth daily.     ezetimibe (ZETIA) 10 MG tablet Take 10 mg by mouth daily.     furosemide (LASIX) 40 MG tablet Take 1 tablet (40 mg total) by  mouth daily as needed for edema.     glucose blood test strip USE 1 STRIP TO CHECK GLUCOSE 2 TO 4 TIMES DAILY     METFORMIN HCL PO      mirabegron ER (MYRBETRIQ) 50 MG TB24 tablet Take 1 tablet (50 mg total) by mouth daily. 30 tablet 0   mupirocin ointment (BACTROBAN) 2 % Apply 1 Application topically 2 (two) times daily.     olmesartan (BENICAR) 20 MG tablet Take 1 tablet (20 mg total) by mouth daily. 30 tablet 0   ONETOUCH VERIO test strip 1 each by Other route daily.     tadalafil (CIALIS) 5 MG tablet Take 1 tablet (5 mg total) by mouth daily. 10 tablet 0   tamsulosin (FLOMAX) 0.4 MG CAPS capsule Take 1 capsule (0.4 mg total) by mouth daily after supper. 30 capsule 0   TRESIBA FLEXTOUCH 200 UNIT/ML FlexTouch Pen Inject into the skin.     Vitamin D, Ergocalciferol, (DRISDOL) 1.25 MG (50000 UNIT) CAPS capsule Take 1 capsule (50,000 Units total) by mouth every 7 (seven) days. 7 capsule 0   No facility-administered medications prior to visit.      Review of Systems  Review of Systems  Constitutional: Negative.  Negative for fatigue.  HENT: Negative.    Respiratory: Negative.    Cardiovascular: Negative.   Psychiatric/Behavioral:  Negative for self-injury and sleep disturbance.      Physical Exam  BP 128/62 (BP Location: Right Arm, Patient Position: Sitting, Cuff Size: Normal)   Pulse 70   Temp 98 F (36.7 C) (Oral)   Ht 5\' 8"  (1.727 m)   Wt 175 lb 12.8 oz (79.7 kg)   SpO2 95%   BMI 26.73 kg/m  Physical Exam Constitutional:      Appearance: Normal appearance.  HENT:     Head: Normocephalic and atraumatic.     Mouth/Throat:     Pharynx: Oropharynx is clear.  Cardiovascular:     Rate and Rhythm: Normal rate and regular rhythm.  Pulmonary:     Effort: Pulmonary effort is normal.     Breath sounds: Normal breath sounds.  Musculoskeletal:        General: Normal range of motion.  Skin:    General: Skin is warm and dry.  Neurological:     General: No focal deficit  present.     Mental Status: He is alert and oriented to person, place, and time. Mental status is at baseline.  Psychiatric:        Mood and Affect: Mood normal.        Behavior: Behavior normal.        Thought Content: Thought content normal.        Judgment: Judgment normal.      Lab Results:  CBC    Component Value Date/Time   WBC 10.2 12/07/2022 1117   WBC 10.5 11/29/2022 0953   RBC 4.45 12/07/2022 1117   RBC 4.14 (  L) 11/29/2022 0953   HGB 13.5 12/07/2022 1117   HCT 42.5 12/07/2022 1117   PLT 298 12/07/2022 1117   MCV 96 12/07/2022 1117   MCH 30.3 12/07/2022 1117   MCH 32.4 11/29/2022 0953   MCHC 31.8 12/07/2022 1117   MCHC 34.0 11/29/2022 0953   RDW 12.4 12/07/2022 1117   LYMPHSABS 1.7 11/29/2022 0953   MONOABS 1.0 11/29/2022 0953   EOSABS 0.1 11/29/2022 0953   BASOSABS 0.0 11/29/2022 0953    BMET    Component Value Date/Time   NA 139 12/07/2022 1116   K 4.9 12/07/2022 1116   CL 98 12/07/2022 1116   CO2 26 12/07/2022 1116   GLUCOSE 282 (H) 12/07/2022 1116   GLUCOSE 215 (H) 11/29/2022 0953   BUN 42 (H) 12/07/2022 1116   CREATININE 1.54 (H) 12/07/2022 1116   CALCIUM 9.7 12/07/2022 1116   GFRNONAA 43 (L) 11/29/2022 0953   GFRAA  10/11/2009 0852    >60        The eGFR has been calculated using the MDRD equation. This calculation has not been validated in all clinical situations. eGFR's persistently <60 mL/min signify possible Chronic Kidney Disease.    BNP    Component Value Date/Time   BNP 57.2 12/07/2022 1117   BNP 233.6 (H) 11/29/2022 0953    ProBNP    Component Value Date/Time   PROBNP 61.0 02/03/2009 1015    Imaging: MR LUMBAR SPINE WO CONTRAST Result Date: 06/27/2023 CLINICAL DATA:  Low back pain, symptoms persist with greater than 6 weeks of treatment. EXAM: MRI LUMBAR SPINE WITHOUT CONTRAST TECHNIQUE: Multiplanar, multisequence MR imaging of the lumbar spine was performed. No intravenous contrast was administered. COMPARISON:   10/12/2019 FINDINGS: Segmentation: 5 lumbar type vertebral bodies as numbered previously. Alignment: 4 mm degenerative anterolisthesis at L4-5, similar to the prior study. Vertebrae: Redemonstration scattered benign appearing hemangiomas. Focal edematous signal within the left anterior corner of the L2 vertebral body, presumed to be discogenic in nature, which could relate to back pain. Chronic type endplate marrow changes lower in the lumbar spine, with only mild edematous change at the L3-4 level. Conus medullaris and cauda equina: Conus extends to the L1 level. Conus and cauda equina appear normal. Paraspinal and other soft tissues: Negative Disc levels: No significant finding at T12-L1 or above. L1-2: Minimal bulging of the disc.  No stenosis. L2-3: Endplate osteophytes and bulging of the disc. Facet and ligamentous hypertrophy. Mild stenosis of both lateral recesses but without definite neural compression. L3-4: Endplate osteophytes and bulging of the disc. Facet and ligamentous hypertrophy. Moderate multifactorial spinal stenosis which could be symptomatic, considerably worsened since 2021. L4-5: Chronic facet osteoarthritis with anterolisthesis of 4 mm. Mild bulging of the disc. Stenosis both lateral recesses and neural foramina that could possibly be symptomatic. Similar appearance to the study of 2021. L5-S1: Endplate osteophytes and bulging of the disc. Facet degeneration and hypertrophy on the right. No central canal stenosis. Mild right foraminal narrowing but without definite compression of the exiting L5 nerve. Similar appearance to the prior exam. IMPRESSION: 1. L3-4: Moderate multifactorial spinal stenosis which could be symptomatic, considerably worsened since 2021. 2. L4-5: Chronic facet osteoarthritis with anterolisthesis of 4 mm. Mild bulging of the disc. Stenosis both lateral recesses and neural foramina that could possibly be symptomatic. Similar appearance to the study of 2021. 3. L5-S1:  Endplate osteophytes and bulging of the disc. Facet degeneration and hypertrophy on the right. Mild right foraminal narrowing but without definite compression of the  exiting L5 nerve. Similar appearance to the prior exam. 4. Focal edematous signal within the left anterior corner of the L2 vertebral body, presumed to be discogenic in nature, which could relate to back pain. Electronically Signed   By: Paulina Fusi M.D.   On: 06/27/2023 09:26   CUP PACEART REMOTE DEVICE CHECK Result Date: 06/26/2023 ILR summary report received. Battery status OK. Normal device function. No new symptom, tachy, brady, or pause episodes. 4 new AF episodes, longest 1 hr 14 min, burden 0%, EGMs show false detections, visible P waves. 2 VHR, longest 3 min, one EGM shows narrow complex tachycardia and the other is a false detection due to oversensing, not a new finding. Monthly summary reports and ROV/PRN - CS, CVRS    Assessment & Plan:   1. OBSTRUCTIVE SLEEP APNEA (Primary) - Home sleep test; Future     Sleep Apnea Mild sleep apnea diagnosed in 2010. Current symptoms do not strongly suggest progression, but reassessment is warranted due to hx atrial fibrillation and CVA.  Patient has a late bedtime due to retirement status and lifestyle.  His sleep does not significantly impact his quality life.  He has no difficulties falling or staying asleep. - Order a one-night home sleep study to re-assess mild OSA and direct potential treatment need.  Paroxysmal Atrial Fibrillation Reassessment of sleep apnea warranted due to potential risk increase.  Depression Mood fluctuations with depressive trend. Currently on 10 mg Lexapro. - Recommend discussing with Dr. Clelia Croft about increasing Lexapro dose.      Glenford Bayley, NP 06/28/2023

## 2023-06-28 NOTE — Telephone Encounter (Signed)
 I also had a VM today from patient, wanting me to call him.  I am sure it is about this appt. IC and left message telling him one of you all would call him in the AM to schedule him.  Thanks.

## 2023-06-28 NOTE — Patient Instructions (Signed)
 Medication Instructions:  Your physician recommends that you continue on your current medications as directed. Please refer to the Current Medication list given to you today.  *If you need a refill on your cardiac medications before your next appointment, please call your pharmacy*  Follow-Up: At [facility], you and your health needs are our priority.  As part of our continuing mission to provide you with exceptional heart care, we have created designated Provider Care Teams.  These Care Teams include your primary Cardiologist (physician) and Advanced Practice Providers (APPs -  Physician Assistants and Nurse Practitioners) who all work together to provide you with the care you need, when you need it.  Your next appointment:   1 year(s)  The format for your next appointment:   In Person  Provider:   Yates Decamp, MD {  Other Instructions   1st Floor: - Lobby - Registration  - Pharmacy  - Lab - Cafe  2nd Floor: - PV Lab - Diagnostic Testing (echo, CT, nuclear med)  3rd Floor: - Vacant  4th Floor: - TCTS (cardiothoracic surgery) - AFib Clinic - Structural Heart Clinic - Vascular Surgery  - Vascular Ultrasound  5th Floor: - HeartCare Cardiology (general and EP) - Clinical Pharmacy for coumadin, hypertension, lipid, weight-loss medications, and med management appointments    Valet parking services will be available as well.

## 2023-06-28 NOTE — Patient Instructions (Signed)
 -  SLEEP APNEA: Sleep apnea is a condition where breathing repeatedly stops and starts during sleep. You were diagnosed with mild sleep apnea in 2010. To reassess your condition, we will arrange a one-night home sleep study.  -ATRIAL FIBRILLATION: Atrial fibrillation is an irregular and often rapid heart rate that can increase the risk of strokes, heart failure, and other heart-related complications. We need to reassess your sleep apnea because it can potentially worsen atrial fibrillation.  -DEPRESSION: Depression is a mood disorder that causes persistent feelings of sadness and loss of interest. You are currently taking 10 mg of Lexapro. We recommend discussing with Dr. Clelia Croft about possibly increasing your dose to better manage your symptoms.  INSTRUCTIONS:  Please schedule a one-night home sleep study to reassess your sleep apnea. Additionally, make an appointment with Dr. Clelia Croft to discuss the possibility of increasing your Lexapro dose for better management of your depression.

## 2023-06-29 ENCOUNTER — Encounter: Payer: Self-pay | Admitting: Physical Medicine and Rehabilitation

## 2023-06-29 ENCOUNTER — Ambulatory Visit: Admitting: Physical Medicine and Rehabilitation

## 2023-06-29 VITALS — BP 164/81 | HR 75

## 2023-06-29 DIAGNOSIS — R269 Unspecified abnormalities of gait and mobility: Secondary | ICD-10-CM | POA: Diagnosis not present

## 2023-06-29 DIAGNOSIS — M5416 Radiculopathy, lumbar region: Secondary | ICD-10-CM

## 2023-06-29 DIAGNOSIS — M48062 Spinal stenosis, lumbar region with neurogenic claudication: Secondary | ICD-10-CM | POA: Diagnosis not present

## 2023-06-29 NOTE — Progress Notes (Signed)
 Pain Scale   Average Pain 6        +Driver, -BT, -Dye Allergies.

## 2023-06-29 NOTE — Progress Notes (Signed)
 [name] - 89 y.o. male MRN [id number]  Date of birth: 1934/08/27  Office Visit Note: Visit Date: 06/29/2023 PCP: Cleatis Polka., MD Referred by: Cleatis Polka., MD  Subjective: Chief Complaint  Patient presents with   Lower Back - Pain   HPI: [name] is a 89 y.o. male who comes in today Chronic, worsening and severe pain to bilateral upper legs, severe pain to lateral thighs, left greater than right. No lower back pain at this time. Patient is somewhat of a poor historian. Pain ongoing for several years, worsens with movement and activity. He describes his pain as sore and aching sensation, currently rates as 6 out of 10. Some relief of pain with home exercise regimen, rest and use of medications. History of formal physical therapy with [facility], good relief of pain with these treatments. Recent lumbar MRI imaging exhibits moderate multi factorial spinal canal stenosis at L3-L4, worsened since 2021. There is facet arthropathy, anterolisthesis and lateral recess stenosis at L4-L5. He has undergone multiple intermittent right intra-articular hip injections in our office with significant relief of pain. He underwent right intra-articular hip injection on 04/26/2023, no relief of pain with this procedure. He also underwent left L4 transforaminal epidural steroid injection in our office on 05/21/2022, no relief of pain with this procedure. History of lumbar epidural steroid injections with Dr. Romero Belling several years ago with minimal relief of pain. Patient currently using walking stick to assist with ambulation. He reports recent swelling to left lower extremity, doppler was performed yesterday, awaiting results.    History of CVA in 2024. Issues with generalized weakness, gait and balance post stroke. Has seen Dr. Carlis Abbott with [facility] in the past.           Review of Systems  Cardiovascular:  Positive for leg swelling.   Musculoskeletal:  Positive for back pain.  Neurological:  Negative for tremors, speech change, focal weakness and headaches.  All other systems reviewed and are negative.  Otherwise per HPI.  Assessment & Plan: Visit Diagnoses:    ICD-10-CM   1. Lumbar radiculopathy  M54.16 Ambulatory referral to Physical Medicine Rehab    2. Spinal stenosis of lumbar region with neurogenic claudication  M48.062 Ambulatory referral to Physical Medicine Rehab    3. Gait abnormality  R26.9 Ambulatory referral to Physical Medicine Rehab       Plan: Findings:  Chronic, worsening and severe pain to bilateral lateral thighs, left greater than right. Patient continues to have severe pain despite good conservative therapies such as formal physical therapy, home exercise regimen, rest and use of medications. Patients clinical presentation and exam are consistent with neurogenic claudication as a result of spinal canal stenosis. Recent lumbar MRI imaging does show moderate spinal canal stenosis at L3-L4. We discussed treatment plan in detail today. Next step is to perform diagnostic and hopefully therapeutic left L3-L4 interlaminar epidural steroid injection under fluoroscopic guidance. He is currently taking Eliquis, will need permission for him to discontinue this medication prior to injection. I discussed injection process with him in detail. He inquired about pain relief with injection, I assured him that we perform injections diagnostically and we will monitor his pain relief post procedure. If he gets good pain relief with injection we can repeat this procedure infrequently as needed. No red flag symptoms noted upon exam today.     Meds & Orders: No orders of the defined types were placed in this  encounter.   Orders Placed This Encounter  Procedures   Ambulatory referral to Physical Medicine Rehab    Follow-up: Return for Left L3-L4 interlaminar epidural steroid injection.   Procedures: No procedures  performed      Clinical History: CLINICAL DATA:  Low back pain, symptoms persist with greater than 6 weeks of treatment.   EXAM: MRI LUMBAR SPINE WITHOUT CONTRAST   TECHNIQUE: Multiplanar, multisequence MR imaging of the lumbar spine was performed. No intravenous contrast was administered.   COMPARISON:  10/12/2019   FINDINGS: Segmentation: 5 lumbar type vertebral bodies as numbered previously.   Alignment: 4 mm degenerative anterolisthesis at L4-5, similar to the prior study.   Vertebrae: Redemonstration scattered benign appearing hemangiomas. Focal edematous signal within the left anterior corner of the L2 vertebral body, presumed to be discogenic in nature, which could relate to back pain. Chronic type endplate marrow changes lower in the lumbar spine, with only mild edematous change at the L3-4 level.   Conus medullaris and cauda equina: Conus extends to the L1 level. Conus and cauda equina appear normal.   Paraspinal and other soft tissues: Negative   Disc levels:   No significant finding at T12-L1 or above.   L1-2: Minimal bulging of the disc.  No stenosis.   L2-3: Endplate osteophytes and bulging of the disc. Facet and ligamentous hypertrophy. Mild stenosis of both lateral recesses but without definite neural compression.   L3-4: Endplate osteophytes and bulging of the disc. Facet and ligamentous hypertrophy. Moderate multifactorial spinal stenosis which could be symptomatic, considerably worsened since 2021.   L4-5: Chronic facet osteoarthritis with anterolisthesis of 4 mm. Mild bulging of the disc. Stenosis both lateral recesses and neural foramina that could possibly be symptomatic. Similar appearance to the study of 2021.   L5-S1: Endplate osteophytes and bulging of the disc. Facet degeneration and hypertrophy on the right. No central canal stenosis. Mild right foraminal narrowing but without definite compression of the exiting L5 nerve. Similar  appearance to the prior exam.   IMPRESSION: 1. L3-4: Moderate multifactorial spinal stenosis which could be symptomatic, considerably worsened since 2021. 2. L4-5: Chronic facet osteoarthritis with anterolisthesis of 4 mm. Mild bulging of the disc. Stenosis both lateral recesses and neural foramina that could possibly be symptomatic. Similar appearance to the study of 2021. 3. L5-S1: Endplate osteophytes and bulging of the disc. Facet degeneration and hypertrophy on the right. Mild right foraminal narrowing but without definite compression of the exiting L5 nerve. Similar appearance to the prior exam. 4. Focal edematous signal within the left anterior corner of the L2 vertebral body, presumed to be discogenic in nature, which could relate to back pain.     Electronically Signed   By: Paulina Fusi M.D.   On: 06/27/2023 09:26   He reports that he has never smoked. He has never used smokeless tobacco.  Recent Labs    10/02/22 0227  HGBA1C 8.4*    Objective:  VS:  HT:    WT:   BMI:     BP:(!) 164/81  HR:75bpm  TEMP: ( )  RESP:  Physical Exam Vitals and nursing note reviewed.  HENT:     Head: Normocephalic and atraumatic.     Right Ear: External ear normal.     Left Ear: External ear normal.     Nose: Nose normal.     Mouth/Throat:     Mouth: Mucous membranes are moist.  Eyes:     Extraocular Movements: Extraocular movements intact.  Cardiovascular:  Rate and Rhythm: Normal rate.     Pulses: Normal pulses.  Pulmonary:     Effort: Pulmonary effort is normal.  Abdominal:     General: Abdomen is flat. There is no distension.  Musculoskeletal:        General: Tenderness present.     Cervical back: Normal range of motion.     Left lower leg: Edema present.     Comments: Patient is slow to rise from seated position to standing. Good lumbar range of motion. No pain noted with facet loading. 5/5 strength noted with bilateral hip flexion, knee flexion/extension, ankle  dorsiflexion/plantarflexion and EHL. No clonus noted bilaterally. No pain upon palpation of greater trochanters. No pain with internal/external rotation of bilateral hips. Sensation intact bilaterally. Negative slump test bilaterally. Ambulates with walking stick, antalgic gait noted.  Skin:    General: Skin is warm and dry.     Capillary Refill: Capillary refill takes less than 2 seconds.  Neurological:     Mental Status: He is alert and oriented to person, place, and time.     Gait: Gait abnormal.  Psychiatric:        Mood and Affect: Mood normal.        Behavior: Behavior normal.     Ortho Exam  Imaging: VAS [place] LOWER EXTREMITY VENOUS (DVT) Result Date: 06/28/2023  Lower Venous DVT Study Patient Name:  [name]  Date of Exam:   06/28/2023 Medical Rec #: [id number]         Accession #:    [id number] Date of Birth: 1934-05-21         Patient Gender: M Patient Age:   40 years Exam Location:  [facility] Procedure:      VAS [place] LOWER EXTREMITY VENOUS (DVT) Referring Phys: JAMIE BASTABLE --------------------------------------------------------------------------------  Indications: Swelling, and Edema.  Risk Factors: DVT R/O. Performing Technologist: Criss Rosales RVT  Examination Guidelines: A complete evaluation includes B-mode imaging, spectral Doppler, color Doppler, and power Doppler as needed of all accessible portions of each vessel. Bilateral testing is considered an integral part of a complete examination. Limited examinations for reoccurring indications may be performed as noted. The reflux portion of the exam is performed with the patient in reverse Trendelenburg.  +-----+---------------+---------+-----------+----------+--------------+ RIGHTCompressibilityPhasicitySpontaneityPropertiesThrombus Aging +-----+---------------+---------+-----------+----------+--------------+ CFV  Full           Yes      Yes                                  +-----+---------------+---------+-----------+----------+--------------+  +---------+---------------+---------+-----------+----------+--------------+ LEFT     CompressibilityPhasicitySpontaneityPropertiesThrombus Aging +---------+---------------+---------+-----------+----------+--------------+ CFV      Full           Yes      Yes                                 +---------+---------------+---------+-----------+----------+--------------+ SFJ      Full           Yes      Yes                                 +---------+---------------+---------+-----------+----------+--------------+ FV Prox  Full           Yes      Yes                                 +---------+---------------+---------+-----------+----------+--------------+  FV Mid   Full           Yes      Yes                                 +---------+---------------+---------+-----------+----------+--------------+ FV DistalFull           Yes      Yes                                 +---------+---------------+---------+-----------+----------+--------------+ PFV      Full           Yes      Yes                                 +---------+---------------+---------+-----------+----------+--------------+ POP      Full           Yes      Yes                                 +---------+---------------+---------+-----------+----------+--------------+ PTV      Full           Yes                                          +---------+---------------+---------+-----------+----------+--------------+ PERO     Full           Yes                                          +---------+---------------+---------+-----------+----------+--------------+ Gastroc  Full           Yes                                          +---------+---------------+---------+-----------+----------+--------------+ GSV      Full           Yes                                           +---------+---------------+---------+-----------+----------+--------------+ SSV      Full           Yes                                          +---------+---------------+---------+-----------+----------+--------------+  Summary: RIGHT: - No evidence of common femoral vein obstruction.  LEFT: - There is no evidence of deep vein thrombosis in the lower extremity. - There is no evidence of superficial venous thrombosis.  *See table(s) above for measurements and observations. Electronically signed by Lemar Livings MD on 06/28/2023 at 5:15:44 PM.    Final     Past Medical/Family/Surgical/Social History: Medications & Allergies reviewed per EMR, new medications updated. 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 10/02/2022   Hemianopsia 10/01/2022   Right homonymous hemianopsia 10/01/2022   Loop recorder: Bi[employer] Biomonitor II loop 07/28/2021 07/28/2021   Unsteady gait 08/05/2020   BPH with obstruction/lower urinary tract symptoms 01/21/2020   Anxiety disorder 02/02/2018   Right hip pain 10/02/2017   Recurrent major depression in remission (HCC) 01/27/2017   Chronic kidney disease, stage 3a (HCC) 07/17/2015   History of colonic polyps 03/26/2013   Obesity 10/04/2012   OBSTRUCTIVE SLEEP APNEA 10/30/2008   HYPERLIPIDEMIA-MIXED 07/01/2008   HYPERTENSION, BENIGN 07/01/2008   CAD, NATIVE VESSEL 07/01/2008   FATIGUE / MALAISE 07/01/2008   EDEMA 07/01/2008   Past Medical History:  Diagnosis Date   Acute renal failure (HCC)    secondary to nephrotic syndrome post cath, resolved with steroids   Anxiety    Arthritis    CAD (coronary artery disease) 04/2006   LIMA to LAD, sequential SVG to first, second, and third OM's, sequential SVG to mid RCA and PDA    Depression     Diabetes mellitus without complication (HCC)    ED (erectile dysfunction)    HTN (hypertension)    Hx of adenomatous colonic polyps    Hyperlipidemia    Loop recorder: Biotronic Biomonitor II loop 07/28/2021 07/28/2021   Obesity    Stroke (HCC)    june   Family History  Problem Relation Age of Onset   Heart attack Mother    Clotting disorder Father        died of renal disease   Heart disease Sister    Cancer Sister    Colon cancer Neg Hx    Past Surgical History:  Procedure Laterality Date   COLONOSCOPY W/ BIOPSIES AND POLYPECTOMY     CORONARY ARTERY BYPASS GRAFT  2008   VESSELS X6   IR CT HEAD LTD  10/02/2022   IR PERCUTANEOUS ART THROMBECTOMY/INFUSION INTRACRANIAL INC DIAG ANGIO  10/02/2022   IR US GUIDE VASC ACCESS RIGHT  10/02/2022   LOOP RECORDER IMPLANT     RADIOLOGY WITH ANESTHESIA N/A 10/02/2022   Procedure: IR WITH ANESTHESIA;  Surgeon: Radiologist, Medication, MD;  Location: [facility] OR;  Service: Radiology;  Laterality: N/A;   UMBILICAL HERNIA REPAIR     Social History   Occupational History   Occupation: Field seismologist  Tobacco Use   Smoking status: Never   Smokeless tobacco: Never  Vaping Use   Vaping status: Never Used  Substance and Sexual Activity   Alcohol use: No    Alcohol/week: 1.0 standard drink of alcohol    Types: 1 Glasses of wine per week   Drug use: No   Sexual activity: Not on file

## 2023-06-29 NOTE — Addendum Note (Signed)
 Addended by: Elease Etienne A on: 06/29/2023 10:37 AM   Modules accepted: Orders

## 2023-06-29 NOTE — Progress Notes (Signed)
 [facility] Summary Report / Loop Recorder

## 2023-07-01 ENCOUNTER — Other Ambulatory Visit (HOSPITAL_COMMUNITY): Payer: Self-pay

## 2023-07-06 ENCOUNTER — Encounter (HOSPITAL_BASED_OUTPATIENT_CLINIC_OR_DEPARTMENT_OTHER): Admitting: Physical Medicine and Rehabilitation

## 2023-07-06 DIAGNOSIS — R29898 Other symptoms and signs involving the musculoskeletal system: Secondary | ICD-10-CM

## 2023-07-06 NOTE — Progress Notes (Signed)
 Subjective:    Patient ID: [name], male    DOB: November 20, 1934, 89 y.o.   MRN: [id number]  HPI:  1) Fatigue: new and worsened since the hospitalization -walking is difficult due to fatigue -says he does not eat very well -did a blood test with Dr. Jacinto Halim -sleeps well  2) Suboptimal testosterone  -he is interested in resistance training -he does not eat much  3) Impaired balance: -fell recently -when hs first gets up he waits a second to get everything organized -is doing PT -takes eliquis and tamsulosin  4) BPH:  -only wakes once at night to urinate  5) Leg weakness: -he is supposed to get another shot in his back but he has to get off his blood thinners for a few days  [name] is a 89 y.o. male who returns for  HFU appointmen for follow up of his Ischemic cerebrovascular accident, Essential Hypertension and Hyperlipidemia. He presented to [facility] ED  on 10/01/2022 with diplopia and difficulty with balance.   H&P: Dr Julieanne Manson:  This is a new problem. The current episode started less than 1 hour ago. The problem occurs constantly. The problem has not changed since onset.Pertinent negatives include no chest pain, no abdominal pain, no headaches and no shortness of breath. Nothing aggravates the symptoms. Nothing relieves the symptoms. He has tried nothing for the symptoms. The treatment provided no relief.   [name] is a 89 y.o. male with a past medical history significant for CAD, hypertension, hyperlipidemia, diabetes, and sleep apnea who presents for neurologic complaint.  According to patient and family, he was last normal approximately 1 hour prior to arrival at 7:45 PM when he noticed sudden onset of double vision and difficulty with ambulation due to unsteadiness.  He has never had this before.  No history of strokes reported.  No history of TIAs to his knowledge.  He was at his baseline without any symptoms and drove somewhere before this onset.  Denies  any headache or neck pain.  Denies trauma.  Denies palpitations, chest pain, shortness of breath.  No history of A-fib to his knowledge.  No vomiting but had some mild nausea with that initially.  Denies any speech difficulties or any numbness tingling or weakness of extremities.   I was called to see patient in triage and on my evaluation, patient does have disconjugate gaze.  His right eye will not look laterally.  He does not have a facial droop and has intact sensation throughout.  Intact grip strength bilaterally and intact strength in the legs.  Finger-nose-finger was difficult due to the vision changes but appeared intact.  Patient did have right-sided hemianopia in both eyes that he does not know when it began.   Patient was activated as a code stroke and quickly taken to CT scanner.  CT initially did not show evidence of acute bleed or acute stroke.  Neurology felt that although the onset is quite clear when the diplopia began, the hemianopia is uncertain.  Thus, patient is not a candidate for TNK at this time.   They recommended MRI and admission to medicine to [facility] for further stroke workup but get CTA and perfusion if possible initially.   CTA was completed and patient does have a P1 occlusion acutely.  There is penumbra and no core infarct at this time.   I coordinated and spoke with Dr. Stephannie Peters with teleneurology and Dr. Amada Jupiter with [facility] neurology team and they  spoke with the interventional neuroradiologist who feels patient is a candidate for thrombectomy.  CT Head: WO Contrast:  IMPRESSION: 1. No acute intracranial process. 2. ASPECTS is 10.  CTA:  IMPRESSION: 1. Short segment occlusion of the proximal left P1 segment, with additional moderate stenosis in the distal left P2 segment. 2. No infarct core identified. Possible area of decreased perfusion in the superior left cerebellum or left occipital lobe. This could reflect decreased perfusion related to  short segment left P1 occlusion. Superior cerebellar decreased perfusion 3. Severe stenosis in the left supraclinoid ICA and moderate stenosis in the right supraclinoid ICA. 4. Severe stenosis at the origin of the right PICA. Mild stenosis in the distal basilar artery. 5. No hemodynamically significant stenosis in the neck. 6. Aortic atherosclerosis.  MR Brain: WO Contrast:  IMPRESSION: 1. Small acute infarcts scattered in the bilateral cerebellum (left greater than right). Punctate acute lacunar infarcts in both the left pons and thalamus. And minimal occipital lobe restricted diffusion, perhaps greater on the right. 2. No hemorrhagic transformation or intracranial mass effect. 3. Underlying chronic small vessel disease, including chronic thalamic microhemorrhages, and nonspecific ventricular enlargement.   He was transferred to [facility]. He underwent thrombectomy.   [name] was admitted to inpatient rehabilitation on 10/06/2022 and discharged home on 10/12/2022. He is receiving Outpatient Therapy at [facility]. He denies any pain. He rates his pain 0. Also reports his appetite is fair.   Daughter in room, all questions answered.   [name] was driving, discharge instructions stated no driving. This was discussed with DrRaulkar. Dr. Carlis Abbott spoke with patient and daughter, they discussed him driving. Dr Carlis Abbott gave [name] instructions for driving.   Pain Inventory Average Pain 3 Pain Right Now 3 My pain is  No pain. Only problem with eye sight  and balance  LOCATION OF PAIN  left hip  BOWEL Number of stools per week: 1-2 Oral laxative use  stool softener   BLADDER Normal and Pads Taking medication for bladder control.   Mobility use a cane ability to climb steps?  yes do you drive?  yes Do you have any goals in this area?  yes  Function retired I need assistance with the following:  meal prep and household duties Do you have any goals in this  area?  yes  Neuro/Psych trouble walking  Prior Studies Any changes since last visit?  no  Physicians involved in your care Any changes since last visit?  no   Family History  Problem Relation Age of Onset   Heart attack Mother    Clotting disorder Father        died of renal disease   Heart disease Sister    Cancer Sister    Colon cancer Neg Hx    Social History   Socioeconomic History   Marital status: Widowed    Spouse name: Not on file   Number of children: Not on file   Years of education: Not on file   Highest education level: Not on file  Occupational History   Occupation: Field seismologist  Tobacco Use   Smoking status: Never   Smokeless tobacco: Never  Vaping Use   Vaping status: Never Used  Substance and Sexual Activity   Alcohol use: No    Alcohol/week: 1.0 standard drink of alcohol    Types: 1 Glasses of wine per week   Drug use: No   Sexual activity: Not on file  Other Topics Concern   Not on  file  Social History Narrative   Retired Surveyor, minerals.  Widowed since 2014.  He has multiple children.  Never smoker 5 caffeinated beverages daily and no alcohol   Social Drivers of Corporate investment banker Strain: Not on file  Food Insecurity: Not on file  Transportation Needs: Not on file  Physical Activity: Not on file  Stress: Not on file  Social Connections: Not on file   Past Surgical History:  Procedure Laterality Date   COLONOSCOPY W/ BIOPSIES AND POLYPECTOMY     CORONARY ARTERY BYPASS GRAFT  2008   VESSELS X6   IR CT HEAD LTD  10/02/2022   IR PERCUTANEOUS ART THROMBECTOMY/INFUSION INTRACRANIAL INC DIAG ANGIO  10/02/2022   IR US GUIDE VASC ACCESS RIGHT  10/02/2022   LOOP RECORDER IMPLANT     RADIOLOGY WITH ANESTHESIA N/A 10/02/2022   Procedure: IR WITH ANESTHESIA;  Surgeon: Radiologist, Medication, MD;  Location: [facility] OR;  Service: Radiology;  Laterality: N/A;   UMBILICAL HERNIA REPAIR     Past Medical History:  Diagnosis Date   Acute  renal failure (HCC)    secondary to nephrotic syndrome post cath, resolved with steroids   Anxiety    Arthritis    CAD (coronary artery disease) 04/2006   LIMA to LAD, sequential SVG to first, second, and third OM's, sequential SVG to mid RCA and PDA    Depression    Diabetes mellitus without complication [facility])    ED (erectile dysfunction)    HTN (hypertension)    Hx of adenomatous colonic polyps    Hyperlipidemia    Loop recorder: Biotronic Biomonitor II loop 07/28/2021 07/28/2021   Obesity    Stroke (HCC)    june   There were no vitals taken for this visit.  Opioid Risk Score:   Fall Risk Score:  `1  Depression screen [facility] 2/9     03/20/2023   10:14 AM 12/12/2022   10:59 AM 11/07/2022   10:51 AM  Depression screen PHQ 2/9  Decreased Interest 3 0 1  Down, Depressed, Hopeless 3 0 1  PHQ - 2 Score 6 0 2  Altered sleeping   0  Tired, decreased energy   1  Change in appetite   1  Feeling bad or failure about yourself    1  Trouble concentrating   1  Moving slowly or fidgety/restless   1  Suicidal thoughts   0  PHQ-9 Score   7    Review of Systems  Eyes:  Positive for visual disturbance.  Musculoskeletal:        Balance  All other systems reviewed and are negative.      Objective:   Not performed       Assessment & Plan:  1.Ischemic cerebrovascular accident: Continue Outpatient Therapy at [facility]. He has a scheduled appointment with Neurology. Continue current medication regimen. Continue to Monitor.   -discussed return to driving and that he is doing well.   -continue PT and OT  2. Essential Hypertension: Cardiology following. Continue current medication regimen. Continue to Monitor.   3.  Hyperlipidemia: Continue current medication regimen: PCP/Cardiology following. Continue to Monitor.   4. Daytime fatigue:  -B12, vitamin D, iron ordered -testosterone level ordered  5. Bradycardia: -discussed that acebutolol ordered -f/u with cardiology to see if  acebutolol can be decreased further  6. Type 2 diabetes: -discussed that goal blood sugar 70-90.   7) Vitamin D insufficiency:  -ergocalciferol 50,000U prescribed once per week for 7 weeks  8) Insomnia: -Try to go outside near sunrise -Get exercise during the day.  -Turn off all devices an hour before bedtime.  -Teas that can benefit: chamomile, valerian root, Brahmi (Bacopa) -Can consider over the counter melatonin, magnesium, and/or L-theanine. Melatonin is an anti-oxidant with multiple health benefits. Magnesium is involved in greater than 300 enzymatic reactions in the body and most of [place] are deficient as our soil is often depleted. There are 7 different types of magnesium- Bioptemizer's is a supplement with all 7 types, and each has unique benefits. Magnesium can also help with constipation and anxiety.  -Pistachios naturally increase the production of melatonin -Cozy Earth bamboo bed sheets are free from toxic chemicals.  -Tart cherry juice or a tart cherry supplement can improve sleep and soreness post-workout   9) Impaired balance: -continue PT  10) BPH: -continue flomax  11) HTN: -Advised checking BP daily at home and logging results to bring into follow-up appointment with PCP and myself. -Reviewed BP meds today.  -Advised regarding healthy foods that can help lower blood pressure and provided with a list: 1) citrus foods- high in vitamins and minerals 2) salmon and other fatty fish - reduces inflammation and oxylipins 3) swiss chard (leafy green)- high level of nitrates 4) pumpkin seeds- one of the best natural sources of magnesium 5) Beans and lentils- high in fiber, magnesium, and potassium 6) Berries- high in flavonoids 7) Amaranth (whole grain, can be cooked similarly to rice and oats)- high in magnesium and fiber 8) Pistachios- even more effective at reducing BP than other nuts 9) Carrots- high in phenolic compounds that relax blood vessels and reduce  inflammation 10) Celery- contain phthalides that relax tissues of arterial walls 11) Tomatoes- can also improve cholesterol and reduce risk of heart disease 12) Broccoli- good source of magnesium, calcium, and potassium 13) Greek yogurt: high in potassium and calcium 14) Herbs and spices: Celery seed, cilantro, saffron, lemongrass, black cumin, ginseng, cinnamon, cardamom, sweet basil, and ginger 15) Chia and flax seeds- also help to lower cholesterol and blood sugar 16) Beets- high levels of nitrates that relax blood vessels  17) spinach and bananas- high in potassium  -Provided lise of supplements that can help with hypertension:  1) magnesium: one high quality brand is Bioptemizers since it contains all 7 types of magnesium, otherwise over the counter magnesium gluconate 400mg  is a good option 2) B vitamins 3) vitamin D 4) potassium 5) CoQ10 6) L-arginine 7) Vitamin C 8) Beetroot -Educated that goal BP is 120/80. -Made goal to incorporate some of the above foods into diet.    12) Leg weakness: -discussed plan to continue with plan for steroid injection -discussed that this has him down in the dumps -discussed that he uses the cane -discussed that he is not doing any PT but he would like to do it once he can walk around  5 minutes spent in discussion of his leg weakness, recommended going outside to get vitamin D, discussed his plan for steroid injection, discussed that he has to hold his Eliquis prior to the injection

## 2023-07-07 ENCOUNTER — Telehealth: Payer: Self-pay

## 2023-07-07 DIAGNOSIS — Z8673 Personal history of transient ischemic attack (TIA), and cerebral infarction without residual deficits: Secondary | ICD-10-CM | POA: Diagnosis not present

## 2023-07-07 DIAGNOSIS — E782 Mixed hyperlipidemia: Secondary | ICD-10-CM | POA: Diagnosis not present

## 2023-07-07 DIAGNOSIS — R600 Localized edema: Secondary | ICD-10-CM | POA: Diagnosis not present

## 2023-07-07 DIAGNOSIS — G3184 Mild cognitive impairment, so stated: Secondary | ICD-10-CM | POA: Diagnosis not present

## 2023-07-07 DIAGNOSIS — F3341 Major depressive disorder, recurrent, in partial remission: Secondary | ICD-10-CM | POA: Diagnosis not present

## 2023-07-07 DIAGNOSIS — I700 Atherosclerosis of aorta: Secondary | ICD-10-CM | POA: Diagnosis not present

## 2023-07-07 DIAGNOSIS — I129 Hypertensive chronic kidney disease with stage 1 through stage 4 chronic kidney disease, or unspecified chronic kidney disease: Secondary | ICD-10-CM | POA: Diagnosis not present

## 2023-07-07 DIAGNOSIS — E1129 Type 2 diabetes mellitus with other diabetic kidney complication: Secondary | ICD-10-CM | POA: Diagnosis not present

## 2023-07-07 DIAGNOSIS — I25810 Atherosclerosis of coronary artery bypass graft(s) without angina pectoris: Secondary | ICD-10-CM | POA: Diagnosis not present

## 2023-07-07 DIAGNOSIS — I480 Paroxysmal atrial fibrillation: Secondary | ICD-10-CM | POA: Diagnosis not present

## 2023-07-07 DIAGNOSIS — N1831 Chronic kidney disease, stage 3a: Secondary | ICD-10-CM | POA: Diagnosis not present

## 2023-07-07 DIAGNOSIS — D6869 Other thrombophilia: Secondary | ICD-10-CM | POA: Diagnosis not present

## 2023-07-07 NOTE — Telephone Encounter (Signed)
 Tresa Endo, CMA called from Dr Alver Fisher office and gave VO that Mr Brickell can come off his BT 3 days prior to [facility] procedure. Re faxed the d/c form for them to complete. Originally faxed on 06/29/23.

## 2023-07-11 DIAGNOSIS — E875 Hyperkalemia: Secondary | ICD-10-CM | POA: Diagnosis not present

## 2023-07-13 DIAGNOSIS — I5032 Chronic diastolic (congestive) heart failure: Secondary | ICD-10-CM | POA: Diagnosis not present

## 2023-07-13 DIAGNOSIS — I130 Hypertensive heart and chronic kidney disease with heart failure and stage 1 through stage 4 chronic kidney disease, or unspecified chronic kidney disease: Secondary | ICD-10-CM | POA: Diagnosis not present

## 2023-07-13 DIAGNOSIS — N1831 Chronic kidney disease, stage 3a: Secondary | ICD-10-CM | POA: Diagnosis not present

## 2023-07-13 DIAGNOSIS — E1129 Type 2 diabetes mellitus with other diabetic kidney complication: Secondary | ICD-10-CM | POA: Diagnosis not present

## 2023-07-17 ENCOUNTER — Ambulatory Visit: Admitting: Physical Medicine and Rehabilitation

## 2023-07-17 ENCOUNTER — Other Ambulatory Visit: Payer: Self-pay

## 2023-07-17 VITALS — BP 148/90 | HR 89

## 2023-07-17 DIAGNOSIS — M5416 Radiculopathy, lumbar region: Secondary | ICD-10-CM | POA: Diagnosis not present

## 2023-07-17 DIAGNOSIS — M48062 Spinal stenosis, lumbar region with neurogenic claudication: Secondary | ICD-10-CM | POA: Diagnosis not present

## 2023-07-17 MED ORDER — METHYLPREDNISOLONE ACETATE 40 MG/ML IJ SUSP
40.0000 mg | Freq: Once | INTRAMUSCULAR | Status: AC
Start: 2023-07-17 — End: 2023-07-17
  Administered 2023-07-17: 40 mg

## 2023-07-17 NOTE — Procedures (Deleted)
 Lumbar Epidural Steroid Injection - Interlaminar Approach with Fluoroscopic Guidance  Patient: [name]      Date of Birth: Mar 22, 1935 MRN: [id number] PCP: Cleatis Polka., MD      Visit Date: 07/17/2023   Universal Protocol:     Consent Given By: the patient  Position: PRONE  Additional Comments: Vital signs were monitored before and after the procedure. Patient was prepped and draped in the usual sterile fashion. The correct patient, procedure, and site was verified.   Injection Procedure Details:   Procedure diagnoses: Lumbar radiculopathy [M54.16]   Meds Administered:  Meds ordered this encounter  Medications   methylPREDNISolone acetate (DEPO-MEDROL) injection 40 mg     Laterality: Left  Location/Site:  L3-4  Needle: 3.5 in., 20 ga. Tuohy  Needle Placement: Paramedian epidural  Findings:   -Comments: Excellent flow of contrast into the epidural space.  Procedure Details: Using a paramedian approach from the side mentioned above, the region overlying the inferior lamina was localized under fluoroscopic visualization and the soft tissues overlying this structure were infiltrated with 4 ml. of 1% Lidocaine without Epinephrine. The Tuohy needle was inserted into the epidural space using a paramedian approach.   The epidural space was localized using loss of resistance along with counter oblique bi-planar fluoroscopic views.  After negative aspirate for air, blood, and CSF, a 2 ml. volume of Isovue-250 was injected into the epidural space and the flow of contrast was observed. Radiographs were obtained for documentation purposes.    The injectate was administered into the level noted above.   Additional Comments:  The patient tolerated the procedure well Dressing: 2 x 2 sterile gauze and Band-Aid    Post-procedure details: Patient was observed during the procedure. Post-procedure instructions were reviewed.  Patient left the clinic in stable  condition.

## 2023-07-17 NOTE — Progress Notes (Deleted)
 [name] - 89 y.o. male MRN [id number]  Date of birth: 1934/05/24  Office Visit Note: Visit Date: 07/17/2023 PCP: Cleatis Polka., MD Referred by: Cleatis Polka., MD  Subjective: No chief complaint on file.  HPI:  [name] is a 89 y.o. male who comes in today at the request of Ellin Goodie, FNP for planned Left L3-4 Lumbar Interlaminar epidural steroid injection with fluoroscopic guidance.  The patient has failed conservative care including home exercise, medications, time and activity modification.  This injection will be diagnostic and hopefully therapeutic.  Please see requesting physician notes for further details and justification.   ROS Otherwise per HPI.  Assessment & Plan: Visit Diagnoses:    ICD-10-CM   1. Lumbar radiculopathy  M54.16 XR C-ARM NO REPORT    Epidural Steroid injection    methylPREDNISolone acetate (DEPO-MEDROL) injection 40 mg    2. Spinal stenosis of lumbar region with neurogenic claudication  M48.062 XR C-ARM NO REPORT    Epidural Steroid injection    methylPREDNISolone acetate (DEPO-MEDROL) injection 40 mg      Plan: No additional findings.   Meds & Orders:  Meds ordered this encounter  Medications   methylPREDNISolone acetate (DEPO-MEDROL) injection 40 mg    Orders Placed This Encounter  Procedures   XR C-ARM NO REPORT   Epidural Steroid injection    Follow-up: No follow-ups on file.   Procedures: No procedures performed      Clinical History: CLINICAL DATA:  Low back pain, symptoms persist with greater than 6 weeks of treatment.   EXAM: MRI LUMBAR SPINE WITHOUT CONTRAST   TECHNIQUE: Multiplanar, multisequence MR imaging of the lumbar spine was performed. No intravenous contrast was administered.   COMPARISON:  10/12/2019   FINDINGS: Segmentation: 5 lumbar type vertebral bodies as numbered previously.   Alignment: 4 mm degenerative anterolisthesis at L4-5, similar to the prior study.   Vertebrae:  Redemonstration scattered benign appearing hemangiomas. Focal edematous signal within the left anterior corner of the L2 vertebral body, presumed to be discogenic in nature, which could relate to back pain. Chronic type endplate marrow changes lower in the lumbar spine, with only mild edematous change at the L3-4 level.   Conus medullaris and cauda equina: Conus extends to the L1 level. Conus and cauda equina appear normal.   Paraspinal and other soft tissues: Negative   Disc levels:   No significant finding at T12-L1 or above.   L1-2: Minimal bulging of the disc.  No stenosis.   L2-3: Endplate osteophytes and bulging of the disc. Facet and ligamentous hypertrophy. Mild stenosis of both lateral recesses but without definite neural compression.   L3-4: Endplate osteophytes and bulging of the disc. Facet and ligamentous hypertrophy. Moderate multifactorial spinal stenosis which could be symptomatic, considerably worsened since 2021.   L4-5: Chronic facet osteoarthritis with anterolisthesis of 4 mm. Mild bulging of the disc. Stenosis both lateral recesses and neural foramina that could possibly be symptomatic. Similar appearance to the study of 2021.   L5-S1: Endplate osteophytes and bulging of the disc. Facet degeneration and hypertrophy on the right. No central canal stenosis. Mild right foraminal narrowing but without definite compression of the exiting L5 nerve. Similar appearance to the prior exam.   IMPRESSION: 1. L3-4: Moderate multifactorial spinal stenosis which could be symptomatic, considerably worsened since 2021. 2. L4-5: Chronic facet osteoarthritis with anterolisthesis of 4 mm. Mild bulging of the disc. Stenosis both lateral recesses and neural foramina that could possibly  be symptomatic. Similar appearance to the study of 2021. 3. L5-S1: Endplate osteophytes and bulging of the disc. Facet degeneration and hypertrophy on the right. Mild right  foraminal narrowing but without definite compression of the exiting L5 nerve. Similar appearance to the prior exam. 4. Focal edematous signal within the left anterior corner of the L2 vertebral body, presumed to be discogenic in nature, which could relate to back pain.     Electronically Signed   By: Paulina Fusi M.D.   On: 06/27/2023 09:26     Objective:  VS:  HT:    WT:   BMI:     BP:   HR: bpm  TEMP: ( )  RESP:  Physical Exam Vitals and nursing note reviewed.  Constitutional:      General: He is not in acute distress.    Appearance: Normal appearance. He is not ill-appearing.  HENT:     Head: Normocephalic and atraumatic.     Right Ear: External ear normal.     Left Ear: External ear normal.     Nose: No congestion.  Eyes:     Extraocular Movements: Extraocular movements intact.  Cardiovascular:     Rate and Rhythm: Normal rate.     Pulses: Normal pulses.  Pulmonary:     Effort: Pulmonary effort is normal. No respiratory distress.  Abdominal:     General: There is no distension.     Palpations: Abdomen is soft.  Musculoskeletal:        General: No tenderness or signs of injury.     Cervical back: Neck supple.     Right lower leg: No edema.     Left lower leg: No edema.     Comments: Patient has good distal strength without clonus.  Skin:    Findings: No erythema or rash.  Neurological:     General: No focal deficit present.     Mental Status: He is alert and oriented to person, place, and time.     Cranial Nerves: No cranial nerve deficit.     Sensory: No sensory deficit.     Motor: No weakness or abnormal muscle tone.     Coordination: Coordination normal.     Gait: Gait abnormal.  Psychiatric:        Mood and Affect: Mood normal.        Behavior: Behavior normal.      Imaging: No results found.

## 2023-07-17 NOTE — Progress Notes (Signed)
 Pain Scale   Average Pain 0        +Driver, -BT, -Dye Allergies.

## 2023-07-17 NOTE — Patient Instructions (Signed)

## 2023-07-19 ENCOUNTER — Telehealth: Payer: Self-pay | Admitting: Physical Medicine and Rehabilitation

## 2023-07-19 NOTE — Telephone Encounter (Signed)
 Patient called and ask to be rescheduled and cancel the appointment today. CB#[phone]

## 2023-07-20 ENCOUNTER — Encounter: Admitting: Physical Medicine and Rehabilitation

## 2023-07-24 ENCOUNTER — Ambulatory Visit: Admitting: Physical Medicine and Rehabilitation

## 2023-07-24 VITALS — BP 152/72 | HR 71

## 2023-07-24 DIAGNOSIS — R531 Weakness: Secondary | ICD-10-CM | POA: Diagnosis not present

## 2023-07-24 DIAGNOSIS — R2681 Unsteadiness on feet: Secondary | ICD-10-CM

## 2023-07-24 NOTE — Progress Notes (Signed)
 Pain Scale   Average Pain 0 Doesn't have pain.  Has questions about the procedure.      +Driver, -BT, -Dye Allergies.

## 2023-07-24 NOTE — Patient Instructions (Signed)

## 2023-07-26 DIAGNOSIS — R2681 Unsteadiness on feet: Secondary | ICD-10-CM | POA: Diagnosis not present

## 2023-07-26 DIAGNOSIS — R296 Repeated falls: Secondary | ICD-10-CM | POA: Diagnosis not present

## 2023-07-27 ENCOUNTER — Ambulatory Visit (INDEPENDENT_AMBULATORY_CARE_PROVIDER_SITE_OTHER): Payer: HMO

## 2023-07-27 DIAGNOSIS — R55 Syncope and collapse: Secondary | ICD-10-CM

## 2023-07-27 LAB — CUP PACEART REMOTE DEVICE CHECK
Date Time Interrogation Session: 20250410083740
Implantable Pulse Generator Implant Date: 20230812
Pulse Gen Model: 436066
Pulse Gen Serial Number: 94081109

## 2023-07-28 DIAGNOSIS — R296 Repeated falls: Secondary | ICD-10-CM | POA: Diagnosis not present

## 2023-07-28 DIAGNOSIS — R2681 Unsteadiness on feet: Secondary | ICD-10-CM | POA: Diagnosis not present

## 2023-07-28 NOTE — Progress Notes (Signed)
 [employer]

## 2023-08-01 ENCOUNTER — Encounter: Payer: Self-pay | Admitting: Physical Medicine and Rehabilitation

## 2023-08-01 DIAGNOSIS — R296 Repeated falls: Secondary | ICD-10-CM | POA: Diagnosis not present

## 2023-08-01 DIAGNOSIS — R2681 Unsteadiness on feet: Secondary | ICD-10-CM | POA: Diagnosis not present

## 2023-08-01 NOTE — Progress Notes (Signed)
 [name] - 89 y.o. male MRN [id number]  Date of birth: 04/25/1934  Office Visit Note: Visit Date: 07/24/2023 PCP: Cleatis Polka., MD Referred by: Cleatis Polka., MD  Subjective: Chief Complaint  Patient presents with   Lower Back - Pain   HPI: [name] is a 89 y.o. male who comes in today for evaluation and management of continued back pain and unsteady gait and balance difficulties with history of falls as well as weakness.  We had had him come in today for planned epidural injection which we had planned for several weeks now.  He typically brings a different caregiver and most appointments has been very hard getting good information and communication back-and-forth.  He has some level of memory difficulty and we have really gone over his case with him on several occasions and those can be reviewed quite well on prior notes.  Caregiver with him today specifically brings up the fact that he is hoping to get increased strength and balance help with the injection which is clearly not something we usually look at.  Had a long discussion with both of them today and spoke over 20 minutes of counseling.  He does in fact state that his back and hips do not really give him much in the way of pain at all that he is really more concerned about balance difficulties and feeling of overall weakness.  He has not had any new falls but has a history of falls.  He does ambulate with a walking stick.  He has had extensive physical therapy but not in physical therapy right now.  MRI findings show mild to moderate stenosis no focal compression with some arthritic changes overall.  Hips have been pretty unrevealing from an x-ray standpoint but he has gotten some pain relief with hip injection in the past.  He has seen other practitioners in the area for the spine.  Again today after long discussion we elected not to do the procedure today as it was really for the wrong reason.  I will ask him to  follow-up with his primary care physician and possibly his neurologist for workup of his balance problems.  I will also talk to him about the nature of balance difficulties as we get older and the biggest issue with falls is strengthening.  I encouraged him to go ahead and start physical therapy again and I have written a handwritten prescription for him to do that today.  He does see [facility].     Review of Systems  Musculoskeletal:  Positive for back pain and joint pain.  Neurological:  Positive for weakness.  All other systems reviewed and are negative.  Otherwise per HPI.  Assessment & Plan: Visit Diagnoses:    ICD-10-CM   1. Unsteady gait  R26.81     2. Weakness  R53.1        Plan: Findings:  See HPI    Meds & Orders: No orders of the defined types were placed in this encounter.  No orders of the defined types were placed in this encounter.   Follow-up: No follow-ups on file.   Procedures: No procedures performed      Clinical History: CLINICAL DATA:  Low back pain, symptoms persist with greater than 6 weeks of treatment.   EXAM: MRI LUMBAR SPINE WITHOUT CONTRAST   TECHNIQUE: Multiplanar, multisequence MR imaging of the lumbar spine was performed. No intravenous contrast was administered.   COMPARISON:  10/12/2019   FINDINGS: Segmentation: 5 lumbar type vertebral bodies as numbered previously.   Alignment: 4 mm degenerative anterolisthesis at L4-5, similar to the prior study.   Vertebrae: Redemonstration scattered benign appearing hemangiomas. Focal edematous signal within the left anterior corner of the L2 vertebral body, presumed to be discogenic in nature, which could relate to back pain. Chronic type endplate marrow changes lower in the lumbar spine, with only mild edematous change at the L3-4 level.   Conus medullaris and cauda equina: Conus extends to the L1 level. Conus and cauda equina appear normal.   Paraspinal and other soft  tissues: Negative   Disc levels:   No significant finding at T12-L1 or above.   L1-2: Minimal bulging of the disc.  No stenosis.   L2-3: Endplate osteophytes and bulging of the disc. Facet and ligamentous hypertrophy. Mild stenosis of both lateral recesses but without definite neural compression.   L3-4: Endplate osteophytes and bulging of the disc. Facet and ligamentous hypertrophy. Moderate multifactorial spinal stenosis which could be symptomatic, considerably worsened since 2021.   L4-5: Chronic facet osteoarthritis with anterolisthesis of 4 mm. Mild bulging of the disc. Stenosis both lateral recesses and neural foramina that could possibly be symptomatic. Similar appearance to the study of 2021.   L5-S1: Endplate osteophytes and bulging of the disc. Facet degeneration and hypertrophy on the right. No central canal stenosis. Mild right foraminal narrowing but without definite compression of the exiting L5 nerve. Similar appearance to the prior exam.   IMPRESSION: 1. L3-4: Moderate multifactorial spinal stenosis which could be symptomatic, considerably worsened since 2021. 2. L4-5: Chronic facet osteoarthritis with anterolisthesis of 4 mm. Mild bulging of the disc. Stenosis both lateral recesses and neural foramina that could possibly be symptomatic. Similar appearance to the study of 2021. 3. L5-S1: Endplate osteophytes and bulging of the disc. Facet degeneration and hypertrophy on the right. Mild right foraminal narrowing but without definite compression of the exiting L5 nerve. Similar appearance to the prior exam. 4. Focal edematous signal within the left anterior corner of the L2 vertebral body, presumed to be discogenic in nature, which could relate to back pain.     Electronically Signed   By: Paulina Fusi M.D.   On: 06/27/2023 09:26   He reports that he has never smoked. He has never used smokeless tobacco.  Recent Labs    10/02/22 0227  HGBA1C 8.4*     Objective:  VS:  HT:    WT:   BMI:     BP:(!) 152/72  HR:71bpm  TEMP: ( )  RESP:  Physical Exam Vitals and nursing note reviewed.  Constitutional:      General: He is not in acute distress.    Appearance: Normal appearance. He is well-developed.  HENT:     Head: Normocephalic and atraumatic.  Eyes:     Conjunctiva/sclera: Conjunctivae normal.     Pupils: Pupils are equal, round, and reactive to light.  Cardiovascular:     Rate and Rhythm: Normal rate.     Pulses: Normal pulses.     Heart sounds: Normal heart sounds.  Pulmonary:     Effort: Pulmonary effort is normal. No respiratory distress.  Musculoskeletal:     Cervical back: Normal range of motion and neck supple. No rigidity.     Right lower leg: No edema.     Left lower leg: No edema.     Comments: He has some difficulty going from sit to stand.  Does not have  a positive Romberg's.  He has no pain with hip rotation.  He has good distal strength.  Skin:    General: Skin is warm and dry.     Findings: No erythema or rash.  Neurological:     General: No focal deficit present.     Mental Status: He is alert and oriented to person, place, and time.     Cranial Nerves: No cranial nerve deficit.     Sensory: No sensory deficit.     Motor: No weakness.     Coordination: Coordination normal.     Gait: Gait abnormal.  Psychiatric:        Mood and Affect: Mood normal.        Behavior: Behavior normal.     Ortho Exam  Imaging: No results found.  Past Medical/Family/Surgical/Social History: Medications & Allergies reviewed per EMR, new medications updated. Patient Active Problem List   Diagnosis Date Noted   Ischemic cerebrovascular accident (CVA) (HCC) 10/06/2022   Status post surgery 10/02/2022   Stroke: Multiple scattered bilateral ischemic infarcts s/p thrombectomy of left P1/PCA TICI 3 Etiology: Likely cardioembolic 10/02/2022   Hemianopsia 10/01/2022   Right homonymous hemianopsia 10/01/2022   Loop  recorder: Biotronic Biomonitor II loop 07/28/2021 07/28/2021   Unsteady gait 08/05/2020   BPH with obstruction/lower urinary tract symptoms 01/21/2020   Anxiety disorder 02/02/2018   Right hip pain 10/02/2017   Recurrent major depression in remission (HCC) 01/27/2017   Chronic kidney disease, stage 3a (HCC) 07/17/2015   History of colonic polyps 03/26/2013   Obesity 10/04/2012   OBSTRUCTIVE SLEEP APNEA 10/30/2008   HYPERLIPIDEMIA-MIXED 07/01/2008   HYPERTENSION, BENIGN 07/01/2008   CAD, NATIVE VESSEL 07/01/2008   FATIGUE / MALAISE 07/01/2008   EDEMA 07/01/2008   Past Medical History:  Diagnosis Date   Acute renal failure (HCC)    secondary to nephrotic syndrome post cath, resolved with steroids   Anxiety    Arthritis    CAD (coronary artery disease) 04/2006   LIMA to LAD, sequential SVG to first, second, and third OM's, sequential SVG to mid RCA and PDA    Depression    Diabetes mellitus without complication (HCC)    ED (erectile dysfunction)    HTN (hypertension)    Hx of adenomatous colonic polyps    Hyperlipidemia    Loop recorder: Biotronic Biomonitor II loop 07/28/2021 07/28/2021   Obesity    Stroke (HCC)    june   Family History  Problem Relation Age of Onset   Heart attack Mother    Clotting disorder Father        died of renal disease   Heart disease Sister    Cancer Sister    Colon cancer Neg Hx    Past Surgical History:  Procedure Laterality Date   COLONOSCOPY W/ BIOPSIES AND POLYPECTOMY     CORONARY ARTERY BYPASS GRAFT  2008   VESSELS X6   IR CT HEAD LTD  10/02/2022   IR PERCUTANEOUS ART THROMBECTOMY/INFUSION INTRACRANIAL INC DIAG ANGIO  10/02/2022   IR US GUIDE VASC ACCESS RIGHT  10/02/2022   LOOP RECORDER IMPLANT     RADIOLOGY WITH ANESTHESIA N/A 10/02/2022   Procedure: IR WITH ANESTHESIA;  Surgeon: Radiologist, Medication, MD;  Location: [facility] OR;  Service: Radiology;  Laterality: N/A;   UMBILICAL HERNIA REPAIR     Social History   Occupational History    Occupation: Field seismologist  Tobacco Use   Smoking status: Never   Smokeless tobacco: Never  Advertising account planner  Vaping status: Never Used  Substance and Sexual Activity   Alcohol use: No    Alcohol/week: 1.0 standard drink of alcohol    Types: 1 Glasses of wine per week   Drug use: No   Sexual activity: Not on file

## 2023-08-03 DIAGNOSIS — R2681 Unsteadiness on feet: Secondary | ICD-10-CM | POA: Diagnosis not present

## 2023-08-03 DIAGNOSIS — R296 Repeated falls: Secondary | ICD-10-CM | POA: Diagnosis not present

## 2023-08-08 DIAGNOSIS — R2681 Unsteadiness on feet: Secondary | ICD-10-CM | POA: Diagnosis not present

## 2023-08-08 DIAGNOSIS — R296 Repeated falls: Secondary | ICD-10-CM | POA: Diagnosis not present

## 2023-08-10 DIAGNOSIS — R2681 Unsteadiness on feet: Secondary | ICD-10-CM | POA: Diagnosis not present

## 2023-08-10 DIAGNOSIS — R296 Repeated falls: Secondary | ICD-10-CM | POA: Diagnosis not present

## 2023-08-28 ENCOUNTER — Ambulatory Visit: Payer: HMO

## 2023-08-28 DIAGNOSIS — R55 Syncope and collapse: Secondary | ICD-10-CM | POA: Diagnosis not present

## 2023-08-28 DIAGNOSIS — I480 Paroxysmal atrial fibrillation: Secondary | ICD-10-CM

## 2023-08-28 LAB — CUP PACEART REMOTE DEVICE CHECK
Date Time Interrogation Session: 20250512081103
Implantable Pulse Generator Implant Date: 20230812
Pulse Gen Model: 436066
Pulse Gen Serial Number: 94081109

## 2023-08-30 ENCOUNTER — Ambulatory Visit: Payer: Self-pay | Admitting: Cardiology

## 2023-08-31 DIAGNOSIS — M25551 Pain in right hip: Secondary | ICD-10-CM | POA: Diagnosis not present

## 2023-08-31 DIAGNOSIS — R109 Unspecified abdominal pain: Secondary | ICD-10-CM | POA: Diagnosis not present

## 2023-08-31 DIAGNOSIS — M47816 Spondylosis without myelopathy or radiculopathy, lumbar region: Secondary | ICD-10-CM | POA: Diagnosis not present

## 2023-08-31 DIAGNOSIS — T148XXA Other injury of unspecified body region, initial encounter: Secondary | ICD-10-CM | POA: Diagnosis not present

## 2023-08-31 DIAGNOSIS — W19XXXA Unspecified fall, initial encounter: Secondary | ICD-10-CM | POA: Diagnosis not present

## 2023-08-31 DIAGNOSIS — R2689 Other abnormalities of gait and mobility: Secondary | ICD-10-CM | POA: Diagnosis not present

## 2023-09-08 NOTE — Progress Notes (Signed)
 [facility] Summary Report / Loop Recorder

## 2023-09-09 ENCOUNTER — Emergency Department (HOSPITAL_BASED_OUTPATIENT_CLINIC_OR_DEPARTMENT_OTHER)
Admission: EM | Admit: 2023-09-09 | Discharge: 2023-09-10 | Disposition: A | Discharge status: Home / Self Care | Attending: Emergency Medicine | Admitting: Emergency Medicine

## 2023-09-09 ENCOUNTER — Other Ambulatory Visit: Payer: Self-pay

## 2023-09-09 ENCOUNTER — Emergency Department (HOSPITAL_BASED_OUTPATIENT_CLINIC_OR_DEPARTMENT_OTHER)

## 2023-09-09 DIAGNOSIS — W01198A Fall on same level from slipping, tripping and stumbling with subsequent striking against other object, initial encounter: Secondary | ICD-10-CM | POA: Diagnosis not present

## 2023-09-09 DIAGNOSIS — S32009A Unspecified fracture of unspecified lumbar vertebra, initial encounter for closed fracture: Secondary | ICD-10-CM

## 2023-09-09 DIAGNOSIS — S2241XA Multiple fractures of ribs, right side, initial encounter for closed fracture: Secondary | ICD-10-CM | POA: Diagnosis not present

## 2023-09-09 DIAGNOSIS — S32018A Other fracture of first lumbar vertebra, initial encounter for closed fracture: Secondary | ICD-10-CM | POA: Diagnosis not present

## 2023-09-09 DIAGNOSIS — Y9301 Activity, walking, marching and hiking: Secondary | ICD-10-CM | POA: Diagnosis not present

## 2023-09-09 DIAGNOSIS — R1013 Epigastric pain: Secondary | ICD-10-CM | POA: Diagnosis not present

## 2023-09-09 DIAGNOSIS — Z7901 Long term (current) use of anticoagulants: Secondary | ICD-10-CM | POA: Insufficient documentation

## 2023-09-09 DIAGNOSIS — I10 Essential (primary) hypertension: Secondary | ICD-10-CM | POA: Diagnosis not present

## 2023-09-09 DIAGNOSIS — S51012A Laceration without foreign body of left elbow, initial encounter: Secondary | ICD-10-CM | POA: Diagnosis not present

## 2023-09-09 DIAGNOSIS — K769 Liver disease, unspecified: Secondary | ICD-10-CM | POA: Diagnosis not present

## 2023-09-09 DIAGNOSIS — S0990XA Unspecified injury of head, initial encounter: Secondary | ICD-10-CM | POA: Insufficient documentation

## 2023-09-09 DIAGNOSIS — S32028A Other fracture of second lumbar vertebra, initial encounter for closed fracture: Secondary | ICD-10-CM | POA: Diagnosis not present

## 2023-09-09 DIAGNOSIS — N400 Enlarged prostate without lower urinary tract symptoms: Secondary | ICD-10-CM | POA: Diagnosis not present

## 2023-09-09 DIAGNOSIS — K8020 Calculus of gallbladder without cholecystitis without obstruction: Secondary | ICD-10-CM | POA: Diagnosis not present

## 2023-09-09 DIAGNOSIS — W19XXXA Unspecified fall, initial encounter: Secondary | ICD-10-CM

## 2023-09-09 LAB — COMPREHENSIVE METABOLIC PANEL WITH GFR
ALT: 16 U/L (ref 0–44)
AST: 23 U/L (ref 15–41)
Albumin: 3.8 g/dL (ref 3.5–5.0)
Alkaline Phosphatase: 96 U/L (ref 38–126)
Anion gap: 11 (ref 5–15)
BUN: 30 mg/dL — ABNORMAL HIGH (ref 8–23)
CO2: 26 mmol/L (ref 22–32)
Calcium: 9.9 mg/dL (ref 8.9–10.3)
Chloride: 100 mmol/L (ref 98–111)
Creatinine, Ser: 1.32 mg/dL — ABNORMAL HIGH (ref 0.61–1.24)
GFR, Estimated: 52 mL/min — ABNORMAL LOW (ref >60)
Glucose, Bld: 159 mg/dL — ABNORMAL HIGH (ref 70–99)
Potassium: 4.2 mmol/L (ref 3.5–5.1)
Sodium: 136 mmol/L (ref 135–145)
Total Bilirubin: 0.4 mg/dL (ref 0.0–1.2)
Total Protein: 6.8 g/dL (ref 6.5–8.1)

## 2023-09-09 LAB — LIPASE, BLOOD: Lipase: 17 U/L (ref 11–51)

## 2023-09-09 LAB — CBC WITH DIFFERENTIAL/PLATELET
Abs Immature Granulocytes: 0.03 10*3/uL (ref 0.00–0.07)
Basophils Absolute: 0.1 10*3/uL (ref 0.0–0.1)
Basophils Relative: 1 %
Eosinophils Absolute: 0.2 10*3/uL (ref 0.0–0.5)
Eosinophils Relative: 2 %
HCT: 43.2 % (ref 39.0–52.0)
Hemoglobin: 14.9 g/dL (ref 13.0–17.0)
Immature Granulocytes: 0 %
Lymphocytes Relative: 18 %
Lymphs Abs: 1.7 10*3/uL (ref 0.7–4.0)
MCH: 33.2 pg (ref 26.0–34.0)
MCHC: 34.5 g/dL (ref 30.0–36.0)
MCV: 96.2 fL (ref 80.0–100.0)
Monocytes Absolute: 1.1 10*3/uL — ABNORMAL HIGH (ref 0.1–1.0)
Monocytes Relative: 12 %
Neutro Abs: 6.4 10*3/uL (ref 1.7–7.7)
Neutrophils Relative %: 67 %
Platelets: 175 10*3/uL (ref 150–400)
RBC: 4.49 MIL/uL (ref 4.22–5.81)
RDW: 13.2 % (ref 11.5–15.5)
WBC: 9.4 10*3/uL (ref 4.0–10.5)
nRBC: 0 % (ref 0.0–0.2)

## 2023-09-09 LAB — URINALYSIS, ROUTINE W REFLEX MICROSCOPIC
Bacteria, UA: NONE SEEN
Bilirubin Urine: NEGATIVE
Glucose, UA: >1000 mg/dL — AB
Hgb urine dipstick: NEGATIVE
Ketones, ur: NEGATIVE mg/dL
Leukocytes,Ua: NEGATIVE
Nitrite: NEGATIVE
Protein, ur: 100 mg/dL — AB
Specific Gravity, Urine: 1.014 (ref 1.005–1.030)
pH: 7 (ref 5.0–8.0)

## 2023-09-09 MED ORDER — SODIUM CHLORIDE 0.9 % IV BOLUS
1000.0000 mL | Freq: Once | INTRAVENOUS | Status: AC
  Administered 2023-09-09: 1000 mL via INTRAVENOUS

## 2023-09-09 MED ORDER — IOHEXOL 300 MG/ML  SOLN
100.0000 mL | Freq: Once | INTRAMUSCULAR | Status: AC | PRN
  Administered 2023-09-09: 100 mL via INTRAVENOUS

## 2023-09-09 MED ORDER — ACETAMINOPHEN 500 MG PO TABS
1000.0000 mg | ORAL_TABLET | Freq: Once | ORAL | Status: AC
  Administered 2023-09-09: 1000 mg via ORAL
  Filled 2023-09-09: qty 2

## 2023-09-09 NOTE — ED Triage Notes (Signed)
 Pt POV with family reporting fall, was wearing socks on wood floor and slipped, hit his head on floor, bump noted left side of head, pt is on Eliquis . Denies LOC, axo x3, mild confusion at baseline per family.

## 2023-09-09 NOTE — ED Provider Notes (Signed)
 [place] EMERGENCY DEPARTMENT AT [facility] Provider Note   CSN: [id number] Arrival date & time: 09/09/23  2017     History  Chief Complaint  Patient presents with   [name] is a 89 y.o. male.  89 yo M with a chief complaints of fall.  Patient was walking with his socks on.  He had wet his pants.  The presumption was that he really had to go to the bathroom and was on his way there and in his urgency slipped and fell.  He typically needs to walk with a walker and did not use any circumstance.  EMTs came out and they were concerned about possible head injury while on Eliquis  and encouraged family to have him brought here for evaluation.  There is also some concern that his urine was strong smelling.  The patient initially tells me that he is fine and has had no injuries.  Later endorsed that he has been having some upper abdominal pains today.  He denies any nausea or vomiting.  No fevers.  Feels like he has been eating and drinking okay.  Does not think he injured his abdomen or fall.  Was having abdominal pain prior to the fall.   Fall       Home Medications Prior to Admission medications   Medication Sig Start Date End Date Taking? Authorizing Provider  acebutolol  (SECTRAL ) 200 MG capsule Take 1 capsule (200 mg total) by mouth 2 (two) times daily. 02/05/23   Knox Perl, MD  acetaminophen  (TYLENOL ) 325 MG tablet Take 2 tablets (650 mg total) by mouth every 4 (four) hours as needed for mild pain (or temp > 37.5 C (99.5 F)). 10/12/22   Angiulli, Everlyn Hockey, PA-C  apixaban  (ELIQUIS ) 5 MG TABS tablet Take 5 mg by mouth 2 (two) times daily. 11/21/22   [provider]  atorvastatin  (LIPITOR ) 80 MG tablet Take 1 tablet (80 mg total) by mouth daily. 10/12/22   Angiulli, Everlyn Hockey, PA-C  empagliflozin  (JARDIANCE ) 25 MG TABS tablet Take 1 tablet (25 mg total) by mouth daily. 10/12/22   Angiulli, Everlyn Hockey, PA-C  escitalopram (LEXAPRO) 10 MG tablet Take 10 mg by  mouth daily. 11/21/22   [provider]  ezetimibe  (ZETIA ) 10 MG tablet Take 10 mg by mouth daily. 11/21/22   [provider]  furosemide  (LASIX ) 40 MG tablet Take 1 tablet (40 mg total) by mouth daily as needed for edema. 12/28/22   Knox Perl, MD  glucose blood test strip USE 1 STRIP TO CHECK GLUCOSE 2 TO 4 TIMES DAILY    [provider]  METFORMIN HCL PO     [provider]  mirabegron  ER (MYRBETRIQ ) 50 MG TB24 tablet Take 1 tablet (50 mg total) by mouth daily. 10/12/22   Angiulli, Daniel J, PA-C  mupirocin  ointment (BACTROBAN ) 2 % Apply 1 Application topically 2 (two) times daily. 11/21/22   [provider]  olmesartan  (BENICAR ) 20 MG tablet Take 1 tablet (20 mg total) by mouth daily. 10/12/22   Angiulli, Everlyn Hockey, PA-C  ONETOUCH VERIO test strip 1 each by Other route daily. 11/15/22   [provider]  tadalafil  (CIALIS ) 5 MG tablet Take 1 tablet (5 mg total) by mouth daily. 10/12/22   Angiulli, Everlyn Hockey, PA-C  tamsulosin  (FLOMAX ) 0.4 MG CAPS capsule Take 1 capsule (0.4 mg total) by mouth daily after supper. 10/12/22   Angiulli, Everlyn Hockey, PA-C  TRESIBA FLEXTOUCH 200 UNIT/ML FlexTouch  Pen Inject into the skin. 11/24/22   [provider]  Vitamin D , Ergocalciferol , (DRISDOL ) 1.25 MG (50000 UNIT) CAPS capsule Take 1 capsule (50,000 Units total) by mouth every 7 (seven) days. 12/13/22   Raulkar, Keven Pel, MD      Allergies    Ketorolac tromethamine    Review of Systems   Review of Systems  Physical Exam Updated Vital Signs BP (!) 171/99   Pulse 91   Resp 17   Ht 5\' 9"  (1.753 m)   Wt 77.1 kg   SpO2 98%   BMI 25.10 kg/m  Physical Exam Vitals and nursing note reviewed.  Constitutional:      Appearance: He is well-developed.  HENT:     Head: Normocephalic and atraumatic.  Eyes:     Pupils: Pupils are equal, round, and reactive to light.  Neck:     Vascular: No JVD.  Cardiovascular:     Rate and Rhythm: Normal rate and regular  rhythm.     Heart sounds: No murmur heard.    No friction rub. No gallop.  Pulmonary:     Effort: No respiratory distress.     Breath sounds: No wheezing.  Abdominal:     General: There is no distension.     Tenderness: There is no abdominal tenderness. There is no guarding or rebound.     Comments: No obvious palpable abdominal discomfort.  Musculoskeletal:        General: Normal range of motion.     Cervical back: Normal range of motion and neck supple.     Comments: 2 skin tears overlying the left elbow.  Full range of motion of the elbow without obvious discomfort.  Able to supinate pronate without issue.  Palpated from head to toe without any other obvious indicators of bony tenderness.  Skin:    Coloration: Skin is not pale.     Findings: No rash.  Neurological:     Mental Status: He is alert and oriented to person, place, and time.  Psychiatric:        Behavior: Behavior normal.     ED Results / Procedures / Treatments   Labs (all labs ordered are listed, but only abnormal results are displayed) Labs Reviewed  CBC WITH DIFFERENTIAL/PLATELET - Abnormal; Notable for the following components:      Result Value   Monocytes Absolute 1.1 (*)    All other components within normal limits  COMPREHENSIVE METABOLIC PANEL WITH GFR - Abnormal; Notable for the following components:   Glucose, Bld 159 (*)    BUN 30 (*)    Creatinine, Ser 1.32 (*)    GFR, Estimated 52 (*)    All other components within normal limits  URINALYSIS, ROUTINE W REFLEX MICROSCOPIC - Abnormal; Notable for the following components:   Glucose, UA >1,000 (*)    Protein, ur 100 (*)    All other components within normal limits  LIPASE, BLOOD    EKG EKG Interpretation Date/Time:  Saturday Sep 09 2023 21:11:47 EDT Ventricular Rate:  90 PR Interval:  252 QRS Duration:  91 QT Interval:  360 QTC Calculation: 441 R Axis:   68  Text Interpretation: Sinus rhythm Prolonged PR interval Inferior infarct, old  No significant change since last tracing Confirmed by Albertus Hughs [phone]) on 09/09/2023 10:40:57 PM  Radiology No results found.  Procedures Procedures    Medications Ordered in ED Medications  sodium chloride  0.9 % bolus 1,000 mL (1,000 mLs Intravenous New Bag/Given 09/09/23 2127)  acetaminophen  (TYLENOL ) tablet 1,000 mg (1,000 mg Oral Given 09/09/23 2113)  iohexol  (OMNIPAQUE ) 300 MG/ML solution 100 mL (100 mLs Intravenous Contrast Given 09/09/23 2323)    ED Course/ Medical Decision Making/ A&P                                 Medical Decision Making Amount and/or Complexity of Data Reviewed Labs: ordered. Radiology: ordered. ECG/medicine tests: ordered.  Risk OTC drugs. Prescription drug management.   89 yo M with a chief complaints of a fall.  Nonsyncopal by history complaining of striking his head.  He was having some abdominal pain earlier today.  There was also some concern that he might have a urinary tract infection.  Will obtain a laboratory evaluation and CT of the head and CT abdomen pelvis.  Reassess.  Lab work without obvious concerning finding.  No acute anemia no significant electrolyte abnormalities.  LFTs and lipase are unremarkable.  No leukocytosis.  UA without infection.  Awaiting CT.  Signed out to Dr. Carylon Claude, please see their note for further details of care in the ED.  The patients results and plan were reviewed and discussed.   Any x-rays performed were independently reviewed by myself.   Differential diagnosis were considered with the presenting HPI.  Medications  sodium chloride  0.9 % bolus 1,000 mL (1,000 mLs Intravenous New Bag/Given 09/09/23 2127)  acetaminophen  (TYLENOL ) tablet 1,000 mg (1,000 mg Oral Given 09/09/23 2113)  iohexol  (OMNIPAQUE ) 300 MG/ML solution 100 mL (100 mLs Intravenous Contrast Given 09/09/23 2323)    Vitals:   09/09/23 2023 09/09/23 2025  BP:  (!) 171/99  Pulse:  91  Resp:  17  SpO2:  98%  Weight: 77.1 kg   Height: 5'  9" (1.753 m)     Final diagnoses:  Closed head injury, initial encounter  Fall, initial encounter  Epigastric abdominal pain           Final Clinical Impression(s) / ED Diagnoses Final diagnoses:  Closed head injury, initial encounter  Fall, initial encounter  Epigastric abdominal pain    Rx / DC Orders ED Discharge Orders     None         Albertus Hughs, DO 09/09/23 2328

## 2023-09-10 NOTE — Discharge Instructions (Signed)
 You were seen today after a fall.  Your CT imaging indicates several rib fractures on the right and 2 transverse process fractures.  We believe these happened actually several weeks ago as you did not hit your back or chest today.  Use the incentive spirometer to help prevent pneumonia.  Take Tylenol  as needed for pain.

## 2023-09-10 NOTE — ED Provider Notes (Signed)
 Patient signed out pending CT imaging.  In brief, patient fell earlier this evening.  Reported slipping and falling.  Also stated he had some abdominal pain.  Labs are reassuring.  CT head is negative without any acute traumatic injury.  CT abdomen pelvis shows several posterior rib fractures on the right and 2 transverse process fractures of the lumbar spine.  On recheck, patient indicates that he fell several weeks ago at physical therapy.  He believes that this is when he injured his ribs and his back.  He had pain at that time and saw the doctor and was told that he was just bruised.  He does not believe he hit his chest or his back today.  He is not having ongoing significant symptoms.  Will provide him with an incentive spirometer to help prevent pneumonia.  Tylenol  for pain at home.  Physical Exam  BP (!) 161/89 (BP Location: Left Arm)   Pulse 80   Temp 98 F (36.7 C) (Oral)   Resp 20   Ht 1.753 m (5\' 9" )   Wt 77.1 kg   SpO2 98%   BMI 25.10 kg/m   Physical Exam Awake and alert, no acute distress Procedures  Procedures  ED Course / MDM    Medical Decision Making Amount and/or Complexity of Data Reviewed Labs: ordered. Radiology: ordered. ECG/medicine tests: ordered.  Risk OTC drugs. Prescription drug management.   Problem List Items Addressed This Visit   None Visit Diagnoses       Closed head injury, initial encounter    -  Primary     Fall, initial encounter         Epigastric abdominal pain         Closed fracture of multiple ribs of right side, initial encounter         Closed fracture of transverse process of lumbar vertebra, initial encounter (HCC)                 Carylon Claude Vonzella Guernsey, MD 09/10/23 [id number]

## 2023-09-18 DIAGNOSIS — I480 Paroxysmal atrial fibrillation: Secondary | ICD-10-CM | POA: Diagnosis not present

## 2023-09-18 DIAGNOSIS — R296 Repeated falls: Secondary | ICD-10-CM | POA: Diagnosis not present

## 2023-09-18 DIAGNOSIS — Z8673 Personal history of transient ischemic attack (TIA), and cerebral infarction without residual deficits: Secondary | ICD-10-CM | POA: Diagnosis not present

## 2023-09-18 DIAGNOSIS — S2241XD Multiple fractures of ribs, right side, subsequent encounter for fracture with routine healing: Secondary | ICD-10-CM | POA: Diagnosis not present

## 2023-09-18 DIAGNOSIS — D6869 Other thrombophilia: Secondary | ICD-10-CM | POA: Diagnosis not present

## 2023-09-18 DIAGNOSIS — R051 Acute cough: Secondary | ICD-10-CM | POA: Diagnosis not present

## 2023-09-18 DIAGNOSIS — S32009D Unspecified fracture of unspecified lumbar vertebra, subsequent encounter for fracture with routine healing: Secondary | ICD-10-CM | POA: Diagnosis not present

## 2023-09-28 ENCOUNTER — Ambulatory Visit (INDEPENDENT_AMBULATORY_CARE_PROVIDER_SITE_OTHER): Payer: HMO

## 2023-09-28 DIAGNOSIS — Z794 Long term (current) use of insulin: Secondary | ICD-10-CM | POA: Diagnosis not present

## 2023-09-28 DIAGNOSIS — E1129 Type 2 diabetes mellitus with other diabetic kidney complication: Secondary | ICD-10-CM | POA: Diagnosis not present

## 2023-09-28 DIAGNOSIS — R55 Syncope and collapse: Secondary | ICD-10-CM

## 2023-09-28 DIAGNOSIS — I25810 Atherosclerosis of coronary artery bypass graft(s) without angina pectoris: Secondary | ICD-10-CM | POA: Diagnosis not present

## 2023-09-28 DIAGNOSIS — N1831 Chronic kidney disease, stage 3a: Secondary | ICD-10-CM | POA: Diagnosis not present

## 2023-09-28 DIAGNOSIS — I129 Hypertensive chronic kidney disease with stage 1 through stage 4 chronic kidney disease, or unspecified chronic kidney disease: Secondary | ICD-10-CM | POA: Diagnosis not present

## 2023-09-29 LAB — CUP PACEART REMOTE DEVICE CHECK
Date Time Interrogation Session: 20250612142817
Implantable Pulse Generator Implant Date: 20230812
Pulse Gen Model: 436066
Pulse Gen Serial Number: 94081109

## 2023-10-01 ENCOUNTER — Ambulatory Visit: Payer: Self-pay | Admitting: Cardiology

## 2023-10-10 DIAGNOSIS — E1129 Type 2 diabetes mellitus with other diabetic kidney complication: Secondary | ICD-10-CM | POA: Diagnosis not present

## 2023-10-12 NOTE — Progress Notes (Signed)
 [employer]

## 2023-10-16 ENCOUNTER — Telehealth: Payer: Self-pay

## 2023-10-18 MED ORDER — APIXABAN 5 MG PO TABS: 5.0000 mg | ORAL_TABLET | Freq: Two times a day (BID) | ORAL | 0 refills | Status: DC

## 2023-10-18 NOTE — Telephone Encounter (Signed)
 Per Dr Lorilee, please give pharmacy approval to refill and please ask them to send any future refill requests to his PCP. Done.

## 2023-11-02 DIAGNOSIS — R41841 Cognitive communication deficit: Secondary | ICD-10-CM | POA: Diagnosis not present

## 2023-11-02 DIAGNOSIS — R278 Other lack of coordination: Secondary | ICD-10-CM | POA: Diagnosis not present

## 2023-11-02 DIAGNOSIS — R296 Repeated falls: Secondary | ICD-10-CM | POA: Diagnosis not present

## 2023-11-02 DIAGNOSIS — R488 Other symbolic dysfunctions: Secondary | ICD-10-CM | POA: Diagnosis not present

## 2023-11-02 DIAGNOSIS — M62552 Muscle wasting and atrophy, not elsewhere classified, left thigh: Secondary | ICD-10-CM | POA: Diagnosis not present

## 2023-11-02 DIAGNOSIS — R2681 Unsteadiness on feet: Secondary | ICD-10-CM | POA: Diagnosis not present

## 2023-11-02 DIAGNOSIS — R4185 Anosognosia: Secondary | ICD-10-CM | POA: Diagnosis not present

## 2023-11-02 DIAGNOSIS — M62562 Muscle wasting and atrophy, not elsewhere classified, left lower leg: Secondary | ICD-10-CM | POA: Diagnosis not present

## 2023-11-02 DIAGNOSIS — M62551 Muscle wasting and atrophy, not elsewhere classified, right thigh: Secondary | ICD-10-CM | POA: Diagnosis not present

## 2023-11-02 DIAGNOSIS — R4789 Other speech disturbances: Secondary | ICD-10-CM | POA: Diagnosis not present

## 2023-11-02 DIAGNOSIS — M62561 Muscle wasting and atrophy, not elsewhere classified, right lower leg: Secondary | ICD-10-CM | POA: Diagnosis not present

## 2023-11-03 DIAGNOSIS — M62561 Muscle wasting and atrophy, not elsewhere classified, right lower leg: Secondary | ICD-10-CM | POA: Diagnosis not present

## 2023-11-03 DIAGNOSIS — R296 Repeated falls: Secondary | ICD-10-CM | POA: Diagnosis not present

## 2023-11-03 DIAGNOSIS — M62552 Muscle wasting and atrophy, not elsewhere classified, left thigh: Secondary | ICD-10-CM | POA: Diagnosis not present

## 2023-11-03 DIAGNOSIS — R4789 Other speech disturbances: Secondary | ICD-10-CM | POA: Diagnosis not present

## 2023-11-03 DIAGNOSIS — R278 Other lack of coordination: Secondary | ICD-10-CM | POA: Diagnosis not present

## 2023-11-03 DIAGNOSIS — R2681 Unsteadiness on feet: Secondary | ICD-10-CM | POA: Diagnosis not present

## 2023-11-03 DIAGNOSIS — M62562 Muscle wasting and atrophy, not elsewhere classified, left lower leg: Secondary | ICD-10-CM | POA: Diagnosis not present

## 2023-11-03 DIAGNOSIS — M62551 Muscle wasting and atrophy, not elsewhere classified, right thigh: Secondary | ICD-10-CM | POA: Diagnosis not present

## 2023-11-03 DIAGNOSIS — R4185 Anosognosia: Secondary | ICD-10-CM | POA: Diagnosis not present

## 2023-11-03 DIAGNOSIS — R41841 Cognitive communication deficit: Secondary | ICD-10-CM | POA: Diagnosis not present

## 2023-11-03 DIAGNOSIS — R488 Other symbolic dysfunctions: Secondary | ICD-10-CM | POA: Diagnosis not present

## 2023-11-05 ENCOUNTER — Emergency Department (HOSPITAL_COMMUNITY)

## 2023-11-05 ENCOUNTER — Other Ambulatory Visit: Payer: Self-pay

## 2023-11-05 ENCOUNTER — Inpatient Hospital Stay (HOSPITAL_COMMUNITY)
Admission: EM | Admit: 2023-11-05 | Discharge: 2023-11-08 | DRG: 872 | Disposition: A | Discharge status: Home Health | Attending: Internal Medicine | Admitting: Internal Medicine

## 2023-11-05 DIAGNOSIS — Z1152 Encounter for screening for COVID-19: Secondary | ICD-10-CM

## 2023-11-05 DIAGNOSIS — Z832 Family history of diseases of the blood and blood-forming organs and certain disorders involving the immune mechanism: Secondary | ICD-10-CM

## 2023-11-05 DIAGNOSIS — I10 Essential (primary) hypertension: Secondary | ICD-10-CM | POA: Diagnosis not present

## 2023-11-05 DIAGNOSIS — F418 Other specified anxiety disorders: Secondary | ICD-10-CM | POA: Diagnosis present

## 2023-11-05 DIAGNOSIS — M62551 Muscle wasting and atrophy, not elsewhere classified, right thigh: Secondary | ICD-10-CM | POA: Diagnosis not present

## 2023-11-05 DIAGNOSIS — N138 Other obstructive and reflux uropathy: Secondary | ICD-10-CM | POA: Diagnosis present

## 2023-11-05 DIAGNOSIS — E876 Hypokalemia: Secondary | ICD-10-CM | POA: Diagnosis present

## 2023-11-05 DIAGNOSIS — N1831 Chronic kidney disease, stage 3a: Secondary | ICD-10-CM | POA: Diagnosis present

## 2023-11-05 DIAGNOSIS — R296 Repeated falls: Secondary | ICD-10-CM | POA: Diagnosis not present

## 2023-11-05 DIAGNOSIS — A419 Sepsis, unspecified organism: Secondary | ICD-10-CM | POA: Diagnosis not present

## 2023-11-05 DIAGNOSIS — Z66 Do not resuscitate: Secondary | ICD-10-CM | POA: Diagnosis present

## 2023-11-05 DIAGNOSIS — Z7901 Long term (current) use of anticoagulants: Secondary | ICD-10-CM

## 2023-11-05 DIAGNOSIS — F32A Depression, unspecified: Secondary | ICD-10-CM | POA: Diagnosis present

## 2023-11-05 DIAGNOSIS — R2681 Unsteadiness on feet: Secondary | ICD-10-CM | POA: Diagnosis not present

## 2023-11-05 DIAGNOSIS — R4185 Anosognosia: Secondary | ICD-10-CM | POA: Diagnosis not present

## 2023-11-05 DIAGNOSIS — R531 Weakness: Secondary | ICD-10-CM | POA: Diagnosis not present

## 2023-11-05 DIAGNOSIS — I480 Paroxysmal atrial fibrillation: Secondary | ICD-10-CM | POA: Diagnosis present

## 2023-11-05 DIAGNOSIS — G3184 Mild cognitive impairment, so stated: Secondary | ICD-10-CM | POA: Diagnosis present

## 2023-11-05 DIAGNOSIS — Z888 Allergy status to other drugs, medicaments and biological substances status: Secondary | ICD-10-CM

## 2023-11-05 DIAGNOSIS — E785 Hyperlipidemia, unspecified: Secondary | ICD-10-CM | POA: Diagnosis present

## 2023-11-05 DIAGNOSIS — F419 Anxiety disorder, unspecified: Secondary | ICD-10-CM | POA: Diagnosis present

## 2023-11-05 DIAGNOSIS — Z8249 Family history of ischemic heart disease and other diseases of the circulatory system: Secondary | ICD-10-CM

## 2023-11-05 DIAGNOSIS — R4789 Other speech disturbances: Secondary | ICD-10-CM | POA: Diagnosis not present

## 2023-11-05 DIAGNOSIS — B961 Klebsiella pneumoniae [K. pneumoniae] as the cause of diseases classified elsewhere: Secondary | ICD-10-CM | POA: Diagnosis present

## 2023-11-05 DIAGNOSIS — N400 Enlarged prostate without lower urinary tract symptoms: Secondary | ICD-10-CM | POA: Diagnosis present

## 2023-11-05 DIAGNOSIS — E1159 Type 2 diabetes mellitus with other circulatory complications: Secondary | ICD-10-CM | POA: Diagnosis present

## 2023-11-05 DIAGNOSIS — E871 Hypo-osmolality and hyponatremia: Secondary | ICD-10-CM | POA: Diagnosis present

## 2023-11-05 DIAGNOSIS — E861 Hypovolemia: Secondary | ICD-10-CM | POA: Diagnosis present

## 2023-11-05 DIAGNOSIS — I700 Atherosclerosis of aorta: Secondary | ICD-10-CM | POA: Diagnosis not present

## 2023-11-05 DIAGNOSIS — Z794 Long term (current) use of insulin: Secondary | ICD-10-CM

## 2023-11-05 DIAGNOSIS — N3000 Acute cystitis without hematuria: Secondary | ICD-10-CM | POA: Diagnosis not present

## 2023-11-05 DIAGNOSIS — G9389 Other specified disorders of brain: Secondary | ICD-10-CM | POA: Diagnosis not present

## 2023-11-05 DIAGNOSIS — R6510 Systemic inflammatory response syndrome (SIRS) of non-infectious origin without acute organ dysfunction: Secondary | ICD-10-CM | POA: Diagnosis not present

## 2023-11-05 DIAGNOSIS — R41841 Cognitive communication deficit: Secondary | ICD-10-CM | POA: Diagnosis not present

## 2023-11-05 DIAGNOSIS — I6782 Cerebral ischemia: Secondary | ICD-10-CM | POA: Diagnosis not present

## 2023-11-05 DIAGNOSIS — Z8673 Personal history of transient ischemic attack (TIA), and cerebral infarction without residual deficits: Secondary | ICD-10-CM

## 2023-11-05 DIAGNOSIS — E1122 Type 2 diabetes mellitus with diabetic chronic kidney disease: Secondary | ICD-10-CM | POA: Diagnosis present

## 2023-11-05 DIAGNOSIS — I129 Hypertensive chronic kidney disease with stage 1 through stage 4 chronic kidney disease, or unspecified chronic kidney disease: Secondary | ICD-10-CM | POA: Diagnosis present

## 2023-11-05 DIAGNOSIS — N1832 Chronic kidney disease, stage 3b: Secondary | ICD-10-CM | POA: Diagnosis present

## 2023-11-05 DIAGNOSIS — R410 Disorientation, unspecified: Secondary | ICD-10-CM | POA: Diagnosis not present

## 2023-11-05 DIAGNOSIS — I152 Hypertension secondary to endocrine disorders: Secondary | ICD-10-CM | POA: Diagnosis present

## 2023-11-05 DIAGNOSIS — R0689 Other abnormalities of breathing: Secondary | ICD-10-CM | POA: Diagnosis not present

## 2023-11-05 DIAGNOSIS — N401 Enlarged prostate with lower urinary tract symptoms: Secondary | ICD-10-CM | POA: Diagnosis present

## 2023-11-05 DIAGNOSIS — M62562 Muscle wasting and atrophy, not elsewhere classified, left lower leg: Secondary | ICD-10-CM | POA: Diagnosis not present

## 2023-11-05 DIAGNOSIS — I2510 Atherosclerotic heart disease of native coronary artery without angina pectoris: Secondary | ICD-10-CM | POA: Diagnosis present

## 2023-11-05 DIAGNOSIS — Z7984 Long term (current) use of oral hypoglycemic drugs: Secondary | ICD-10-CM

## 2023-11-05 DIAGNOSIS — R4182 Altered mental status, unspecified: Secondary | ICD-10-CM | POA: Diagnosis not present

## 2023-11-05 DIAGNOSIS — R278 Other lack of coordination: Secondary | ICD-10-CM | POA: Diagnosis not present

## 2023-11-05 DIAGNOSIS — E119 Type 2 diabetes mellitus without complications: Secondary | ICD-10-CM

## 2023-11-05 DIAGNOSIS — Z9181 History of falling: Secondary | ICD-10-CM

## 2023-11-05 DIAGNOSIS — R918 Other nonspecific abnormal finding of lung field: Secondary | ICD-10-CM | POA: Diagnosis not present

## 2023-11-05 DIAGNOSIS — R0989 Other specified symptoms and signs involving the circulatory and respiratory systems: Secondary | ICD-10-CM | POA: Diagnosis not present

## 2023-11-05 DIAGNOSIS — B9689 Other specified bacterial agents as the cause of diseases classified elsewhere: Secondary | ICD-10-CM | POA: Diagnosis present

## 2023-11-05 DIAGNOSIS — M62552 Muscle wasting and atrophy, not elsewhere classified, left thigh: Secondary | ICD-10-CM | POA: Diagnosis not present

## 2023-11-05 DIAGNOSIS — Z951 Presence of aortocoronary bypass graft: Secondary | ICD-10-CM

## 2023-11-05 DIAGNOSIS — I440 Atrioventricular block, first degree: Secondary | ICD-10-CM | POA: Diagnosis present

## 2023-11-05 DIAGNOSIS — Z79899 Other long term (current) drug therapy: Secondary | ICD-10-CM

## 2023-11-05 DIAGNOSIS — R488 Other symbolic dysfunctions: Secondary | ICD-10-CM | POA: Diagnosis not present

## 2023-11-05 DIAGNOSIS — E1169 Type 2 diabetes mellitus with other specified complication: Secondary | ICD-10-CM | POA: Diagnosis present

## 2023-11-05 DIAGNOSIS — I672 Cerebral atherosclerosis: Secondary | ICD-10-CM | POA: Diagnosis not present

## 2023-11-05 DIAGNOSIS — M62561 Muscle wasting and atrophy, not elsewhere classified, right lower leg: Secondary | ICD-10-CM | POA: Diagnosis not present

## 2023-11-05 LAB — COMPREHENSIVE METABOLIC PANEL WITH GFR
ALT: 26 U/L (ref 0–44)
AST: 34 U/L (ref 15–41)
Albumin: 3.7 g/dL (ref 3.5–5.0)
Alkaline Phosphatase: 94 U/L (ref 38–126)
Anion gap: 11 (ref 5–15)
BUN: 20 mg/dL (ref 8–23)
CO2: 26 mmol/L (ref 22–32)
Calcium: 9.3 mg/dL (ref 8.9–10.3)
Chloride: 95 mmol/L — ABNORMAL LOW (ref 98–111)
Creatinine, Ser: 1.29 mg/dL — ABNORMAL HIGH (ref 0.61–1.24)
GFR, Estimated: 54 mL/min — ABNORMAL LOW (ref >60)
Glucose, Bld: 261 mg/dL — ABNORMAL HIGH (ref 70–99)
Potassium: 3.8 mmol/L (ref 3.5–5.1)
Sodium: 132 mmol/L — ABNORMAL LOW (ref 135–145)
Total Bilirubin: 1 mg/dL (ref 0.0–1.2)
Total Protein: 6.9 g/dL (ref 6.5–8.1)

## 2023-11-05 LAB — URINALYSIS, ROUTINE W REFLEX MICROSCOPIC
Bacteria, UA: NONE SEEN
Bilirubin Urine: NEGATIVE
Glucose, UA: >=500 mg/dL — AB
Hgb urine dipstick: NEGATIVE
Ketones, ur: NEGATIVE mg/dL
Leukocytes,Ua: NEGATIVE
Nitrite: NEGATIVE
Protein, ur: 30 mg/dL — AB
Specific Gravity, Urine: 1.009 (ref 1.005–1.030)
pH: 7 (ref 5.0–8.0)

## 2023-11-05 LAB — CBG MONITORING, ED: Glucose-Capillary: 277 mg/dL — ABNORMAL HIGH (ref 70–99)

## 2023-11-05 LAB — RESP PANEL BY RT-PCR (RSV, FLU A&B, COVID)  RVPGX2
Influenza A by PCR: NEGATIVE
Influenza B by PCR: NEGATIVE
Resp Syncytial Virus by PCR: NEGATIVE
SARS Coronavirus 2 by RT PCR: NEGATIVE

## 2023-11-05 LAB — LACTIC ACID, PLASMA
Lactic Acid, Venous: 1.4 mmol/L (ref 0.5–1.9)
Lactic Acid, Venous: 1 mmol/L (ref 0.5–1.9)

## 2023-11-05 LAB — CBC
HCT: 43.2 % (ref 39.0–52.0)
Hemoglobin: 14.5 g/dL (ref 13.0–17.0)
MCH: 32.6 pg (ref 26.0–34.0)
MCHC: 33.6 g/dL (ref 30.0–36.0)
MCV: 97.1 fL (ref 80.0–100.0)
Platelets: 198 K/uL (ref 150–400)
RBC: 4.45 MIL/uL (ref 4.22–5.81)
RDW: 13.3 % (ref 11.5–15.5)
WBC: 21.1 K/uL — ABNORMAL HIGH (ref 4.0–10.5)
nRBC: 0 % (ref 0.0–0.2)

## 2023-11-05 MED ORDER — SODIUM CHLORIDE 0.9 % IV SOLN
1.0000 g | Freq: Once | INTRAVENOUS | Status: AC
  Administered 2023-11-05: 1 g via INTRAVENOUS
  Filled 2023-11-05: qty 10

## 2023-11-05 MED ORDER — IOHEXOL 300 MG/ML  SOLN
100.0000 mL | Freq: Once | INTRAMUSCULAR | Status: AC | PRN
  Administered 2023-11-05: 100 mL via INTRAVENOUS

## 2023-11-05 MED ORDER — ACETAMINOPHEN 325 MG PO TABS
650.0000 mg | ORAL_TABLET | Freq: Once | ORAL | Status: AC | PRN
  Administered 2023-11-05: 650 mg via ORAL
  Filled 2023-11-05: qty 2

## 2023-11-05 NOTE — Hospital Course (Signed)
 [name] is a 89 y.o. male with medical history significant for CAD s/p CABG, PAF on Eliquis , history of CVA, CKD stage IIIa, T2DM, HTN, HLD, BPH, depression/anxiety, MCI who is admitted with SIRS, presumed UTI/cystitis.

## 2023-11-05 NOTE — H&P (Signed)
 History and Physical    [name] FMW:[id number] DOB: [birth year] DOA: 11/05/2023  PCP: Loreli Elsie JONETTA Mickey., MD  Patient coming from: [name]  I have personally briefly reviewed patient's old medical records in [facility]  Chief Complaint: Generalized weakness  HPI: [name] is a 89 y.o. male with medical history significant for CAD s/p CABG, PAF on Eliquis , history of CVA, CKD stage IIIa, T2DM, HTN, HLD, BPH, depression/anxiety, MCI who presented to the ED from independent living for evaluation of generalized weakness.  Patient states for the last 2 weeks he has been feeling generally weak, mostly feels weak in both legs.  He has had some falls without injury.  He has not hit his head or lost consciousness.  He says he also feels dizzy at times.  This can occur at rest or with activity but more noticeable when he changes positions.  He has not had any chest pain, dyspnea, cough, nausea, vomit, abdominal pain, diarrhea, dysuria.  He says he saw some swelling in his legs earlier for which he takes Lasix  as needed.  He reports good urine output without dysuria.  He says he normally ambulates with use of a walker.  ED Course  Labs/Imaging on admission: I have personally reviewed following labs and imaging studies.  Initial vitals showed BP 154/86, pulse 94, RR 18, temp 98.7 F, SpO2 95% on room air.  Tmax 101.2 F rectally.  Labs showed WBC 21.1, hemoglobin 14.5, platelets 198, sodium 132, potassium 3.8, bicarb 26, BUN 20, creatinine 1.29, serum glucose 261, LFTs within normal limits, lactic acid 1.4.  UA showed >500 glucose, negative ketones, 30 protein, negative nitrates or leukocytes, 0-5 RBCs and WBCs, no bacteria.  Blood and urine cultures in process.  SARS-CoV-2, influenza, RSV PCR negative.  CT head without contrast negative for acute intracranial abnormality.  Atrophy with stable ventriculomegaly and small vessel ischemic changes noted.  CT  chest/abdomen/pelvis with contrast negative for acute findings in the chest.  Thick-walled bladder with mild perivesical stranding noted, possibly reflecting sequela of chronic bladder outlet obstruction or superimposed cystitis.  Prostatomegaly noted.  Patient was given IV ceftriaxone .  The hospitalist service was consulted to admit.  Review of Systems: All systems reviewed and are negative except as documented in history of present illness above.   Past Medical History:  Diagnosis Date   Acute renal failure (HCC)    secondary to nephrotic syndrome post cath, resolved with steroids   Anxiety    Arthritis    CAD (coronary artery disease) 04/2006   LIMA to LAD, sequential SVG to first, second, and third OM's, sequential SVG to mid RCA and PDA    Depression    Diabetes mellitus without complication [facility])    ED (erectile dysfunction)    HTN (hypertension)    Hx of adenomatous colonic polyps    Hyperlipidemia    Loop recorder: Biotronic Biomonitor II loop 07/28/2021 07/28/2021   Obesity    Stroke (HCC)    june    Past Surgical History:  Procedure Laterality Date   COLONOSCOPY W/ BIOPSIES AND POLYPECTOMY     CORONARY ARTERY BYPASS GRAFT  2008   VESSELS X6   IR CT HEAD LTD  10/02/2022   IR PERCUTANEOUS ART THROMBECTOMY/INFUSION INTRACRANIAL INC DIAG ANGIO  10/02/2022   IR US  GUIDE VASC ACCESS RIGHT  10/02/2022   LOOP RECORDER IMPLANT     RADIOLOGY WITH ANESTHESIA N/A 10/02/2022   Procedure: IR WITH ANESTHESIA;  Surgeon:  Radiologist, Medication, MD;  Location: [facility] OR;  Service: Radiology;  Laterality: N/A;   UMBILICAL HERNIA REPAIR      Social History: Social History   Tobacco Use   Smoking status: Never   Smokeless tobacco: Never  Vaping Use   Vaping status: Never Used  Substance Use Topics   Alcohol  use: No    Alcohol /week: 1.0 standard drink of alcohol     Types: 1 Glasses of wine per week   Drug use: No   Allergies  Allergen Reactions   Ketorolac Tromethamine Other (See  Comments)    nephrotic syndrome    Family History  Problem Relation Age of Onset   Heart attack Mother    Clotting disorder Father        died of renal disease   Heart disease Sister    Cancer Sister    Colon cancer Neg Hx      Prior to Admission medications   Medication Sig Start Date End Date Taking? Authorizing Provider  acebutolol  (SECTRAL ) 200 MG capsule Take 1 capsule (200 mg total) by mouth 2 (two) times daily. 02/05/23   Ladona Heinz, MD  acetaminophen  (TYLENOL ) 325 MG tablet Take 2 tablets (650 mg total) by mouth every 4 (four) hours as needed for mild pain (or temp > 37.5 C (99.5 F)). 10/12/22   Angiulli, Toribio PARAS, PA-C  apixaban  (ELIQUIS ) 5 MG TABS tablet Take 1 tablet (5 mg total) by mouth 2 (two) times daily. 10/18/23   Raulkar, Sven SQUIBB, MD  atorvastatin  (LIPITOR ) 80 MG tablet Take 1 tablet (80 mg total) by mouth daily. 10/12/22   Angiulli, Toribio PARAS, PA-C  empagliflozin  (JARDIANCE ) 25 MG TABS tablet Take 1 tablet (25 mg total) by mouth daily. 10/12/22   Angiulli, Toribio PARAS, PA-C  escitalopram  (LEXAPRO ) 10 MG tablet Take 10 mg by mouth daily. 11/21/22   [provider]  ezetimibe  (ZETIA ) 10 MG tablet Take 10 mg by mouth daily. 11/21/22   [provider]  furosemide  (LASIX ) 40 MG tablet Take 1 tablet (40 mg total) by mouth daily as needed for edema. 12/28/22   Ladona Heinz, MD  glucose blood test strip USE 1 STRIP TO CHECK GLUCOSE 2 TO 4 TIMES DAILY    [provider]  METFORMIN HCL PO     [provider]  mirabegron  ER (MYRBETRIQ ) 50 MG TB24 tablet Take 1 tablet (50 mg total) by mouth daily. 10/12/22   Angiulli, Daniel J, PA-C  mupirocin  ointment (BACTROBAN ) 2 % Apply 1 Application topically 2 (two) times daily. 11/21/22   [provider]  olmesartan  (BENICAR ) 20 MG tablet Take 1 tablet (20 mg total) by mouth daily. 10/12/22   Angiulli, Toribio PARAS, PA-C  ONETOUCH VERIO test strip 1 each by Other route daily. 11/15/22   [provider]   tadalafil  (CIALIS ) 5 MG tablet Take 1 tablet (5 mg total) by mouth daily. 10/12/22   Angiulli, Toribio PARAS, PA-C  tamsulosin  (FLOMAX ) 0.4 MG CAPS capsule Take 1 capsule (0.4 mg total) by mouth daily after supper. 10/12/22   Angiulli, Toribio PARAS, PA-C  TRESIBA FLEXTOUCH 200 UNIT/ML FlexTouch Pen Inject into the skin. 11/24/22   [provider]  Vitamin D , Ergocalciferol , (DRISDOL ) 1.25 MG (50000 UNIT) CAPS capsule Take 1 capsule (50,000 Units total) by mouth every 7 (seven) days. 12/13/22   Lorilee Sven SQUIBB, MD    Physical Exam: Vitals:   11/05/23 2150 11/05/23 2200 11/05/23 2245 11/05/23 2326  BP:  (!) 150/74  (!) 144/70  Pulse:  77 76 85  Resp:  18 20 20   Temp: 98.1 F (36.7 C)   98.1 F (36.7 C)  TempSrc: Oral     SpO2:  93% 96% 96%  Weight:      Height:       Constitutional: Resting in bed in the left lateral decubitus position.  NAD, calm, comfortable Eyes: EOMI, lids and conjunctivae normal ENMT: Mucous membranes are moist. Posterior pharynx clear of any exudate or lesions.Normal dentition.  Neck: normal, supple, no masses. Respiratory: clear to auscultation bilaterally, no wheezing, no crackles. Normal respiratory effort. No accessory muscle use.  Cardiovascular: Regular rate, no murmurs / rubs / gallops. No extremity edema. 2+ pedal pulses. Abdomen: no tenderness, no masses palpated. Musculoskeletal: no clubbing / cyanosis. No joint deformity upper and lower extremities. Good ROM, no contractures. Normal muscle tone.  Skin: no rashes, lesions, ulcers. No induration Neurologic: Sensation intact. Strength 5/5 in all 4.  Psychiatric: Normal judgment and insight. Alert and oriented x 3. Normal mood.   EKG: Personally reviewed. Sinus rhythm, rate 92, first-degree AV block, PACs.  Similar to previous.  Assessment/Plan Principal Problem:   SIRS (systemic inflammatory response syndrome) (HCC) Active Problems:   Generalized weakness   Paroxysmal atrial fibrillation (HCC)    Insulin  dependent type 2 diabetes mellitus (HCC)   Chronic kidney disease, stage 3a (HCC)   Hyperlipidemia associated with type 2 diabetes mellitus (HCC)   CAD, NATIVE VESSEL   Depression with anxiety   BPH with obstruction/lower urinary tract symptoms   Hypertension associated with diabetes (HCC)   History of CVA (cerebrovascular accident)   MCI (mild cognitive impairment)   [name] is a 89 y.o. male with medical history significant for CAD s/p CABG, PAF on Eliquis , history of CVA, CKD stage IIIa, T2DM, HTN, HLD, BPH, depression/anxiety, MCI who is admitted with SIRS, presumed UTI/cystitis.  Assessment and Plan: SIRS: Patient presenting with leukocytosis and fever.  No obvious infectious process.  COVID, influenza, RSV panel negative.  Although CT A/P shows bilateral changes, given negative UA, these changes are likely related to his BPH and chronic bladder outlet obstruction.  Blood pressure and heart rate stable, not requiring aggressive IV fluid resuscitation. - S/p IV ceftriaxone  dose given in ED, hold further antibiotics - Follow blood and urine cultures - Repeat CBC in a.m.  Generalized weakness: Generally weak, feels more so in both legs.  No focal deficits.  Has had falls without injury or syncope. - PT/OT eval, fall precautions  Paroxysmal atrial fibrillation: In sinus rhythm with first-degree AV block on admission.  Resume home acebutolol  and Eliquis .  Insulin -dependent type 2 diabetes: Holding metformin.  Continue Jardiance .  Placed on SSI.  CKD stage IIIa: Renal function stable.  Hypertension: Continue olmesartan .  History of CVA: Continue Eliquis , atorvastatin , Zetia .  CAD s/p CABG: Stable, denies chest pain.  Continue Eliquis , atorvastatin , Zetia .  Hyperlipidemia: Continue atorvastatin  and Zetia .  Depression/anxiety: Continue Lexapro .  BPH: Continue Flomax .  Mild cognitive impairment: Stable, answering all questions and providing relevant  history appropriately.  AO x 4.   DVT prophylaxis:  apixaban  (ELIQUIS ) tablet 5 mg  Code Status:   Code Status: Do not attempt resuscitation (DNR) PRE-ARREST INTERVENTIONS DESIRED confirmed with patient on admission. Family Communication: Discussed with patient, he has discussed with family Disposition Plan: From [facility] and likely discharge to same facility pending clinical progress Consults called: None Severity of Illness: The appropriate patient status for this patient is OBSERVATION. Observation status is  judged to be reasonable and necessary in order to provide the required intensity of service to ensure the patient's safety. The patient's presenting symptoms, physical exam findings, and initial radiographic and laboratory data in the context of their medical condition is felt to place them at decreased risk for further clinical deterioration. Furthermore, it is anticipated that the patient will be medically stable for discharge from the hospital within 2 midnights of admission.   Jorie Blanch MD [facility]  If 7PM-7AM, please contact night-coverage [email or web address]  11/06/2023, 12:22 AM

## 2023-11-05 NOTE — ED Triage Notes (Signed)
 Pt bib EMS for onset of weakness through the day. Started the day using walker and was at baseline. This afternoon he was unable to get up and use walker. Took PRN lasix  this morning not normal occurrence. Pt had swelling in legs. Missed eliquis  for 2 nights, BP meds, and enlarged prostate. CBG 183.

## 2023-11-06 DIAGNOSIS — Z66 Do not resuscitate: Secondary | ICD-10-CM | POA: Diagnosis not present

## 2023-11-06 DIAGNOSIS — I129 Hypertensive chronic kidney disease with stage 1 through stage 4 chronic kidney disease, or unspecified chronic kidney disease: Secondary | ICD-10-CM | POA: Diagnosis not present

## 2023-11-06 DIAGNOSIS — E871 Hypo-osmolality and hyponatremia: Secondary | ICD-10-CM | POA: Diagnosis not present

## 2023-11-06 DIAGNOSIS — I480 Paroxysmal atrial fibrillation: Secondary | ICD-10-CM | POA: Diagnosis not present

## 2023-11-06 DIAGNOSIS — E876 Hypokalemia: Secondary | ICD-10-CM | POA: Diagnosis not present

## 2023-11-06 DIAGNOSIS — R6510 Systemic inflammatory response syndrome (SIRS) of non-infectious origin without acute organ dysfunction: Secondary | ICD-10-CM | POA: Diagnosis not present

## 2023-11-06 DIAGNOSIS — I2510 Atherosclerotic heart disease of native coronary artery without angina pectoris: Secondary | ICD-10-CM | POA: Diagnosis not present

## 2023-11-06 DIAGNOSIS — Z794 Long term (current) use of insulin: Secondary | ICD-10-CM | POA: Diagnosis not present

## 2023-11-06 DIAGNOSIS — Z79899 Other long term (current) drug therapy: Secondary | ICD-10-CM | POA: Diagnosis not present

## 2023-11-06 DIAGNOSIS — N3000 Acute cystitis without hematuria: Secondary | ICD-10-CM | POA: Diagnosis not present

## 2023-11-06 DIAGNOSIS — Z7901 Long term (current) use of anticoagulants: Secondary | ICD-10-CM | POA: Diagnosis not present

## 2023-11-06 DIAGNOSIS — R531 Weakness: Secondary | ICD-10-CM | POA: Diagnosis not present

## 2023-11-06 DIAGNOSIS — E861 Hypovolemia: Secondary | ICD-10-CM | POA: Diagnosis not present

## 2023-11-06 DIAGNOSIS — N138 Other obstructive and reflux uropathy: Secondary | ICD-10-CM | POA: Diagnosis not present

## 2023-11-06 DIAGNOSIS — Z832 Family history of diseases of the blood and blood-forming organs and certain disorders involving the immune mechanism: Secondary | ICD-10-CM | POA: Diagnosis not present

## 2023-11-06 DIAGNOSIS — E1122 Type 2 diabetes mellitus with diabetic chronic kidney disease: Secondary | ICD-10-CM | POA: Diagnosis not present

## 2023-11-06 DIAGNOSIS — F32A Depression, unspecified: Secondary | ICD-10-CM | POA: Diagnosis not present

## 2023-11-06 DIAGNOSIS — N1832 Chronic kidney disease, stage 3b: Secondary | ICD-10-CM | POA: Diagnosis not present

## 2023-11-06 DIAGNOSIS — Z1152 Encounter for screening for COVID-19: Secondary | ICD-10-CM | POA: Diagnosis not present

## 2023-11-06 DIAGNOSIS — R918 Other nonspecific abnormal finding of lung field: Secondary | ICD-10-CM | POA: Diagnosis not present

## 2023-11-06 DIAGNOSIS — N401 Enlarged prostate with lower urinary tract symptoms: Secondary | ICD-10-CM | POA: Diagnosis not present

## 2023-11-06 DIAGNOSIS — E785 Hyperlipidemia, unspecified: Secondary | ICD-10-CM | POA: Diagnosis not present

## 2023-11-06 DIAGNOSIS — Z8249 Family history of ischemic heart disease and other diseases of the circulatory system: Secondary | ICD-10-CM | POA: Diagnosis not present

## 2023-11-06 DIAGNOSIS — E1169 Type 2 diabetes mellitus with other specified complication: Secondary | ICD-10-CM | POA: Diagnosis not present

## 2023-11-06 DIAGNOSIS — F419 Anxiety disorder, unspecified: Secondary | ICD-10-CM | POA: Diagnosis not present

## 2023-11-06 DIAGNOSIS — A419 Sepsis, unspecified organism: Secondary | ICD-10-CM | POA: Diagnosis not present

## 2023-11-06 DIAGNOSIS — Z7984 Long term (current) use of oral hypoglycemic drugs: Secondary | ICD-10-CM | POA: Diagnosis not present

## 2023-11-06 DIAGNOSIS — R0989 Other specified symptoms and signs involving the circulatory and respiratory systems: Secondary | ICD-10-CM | POA: Diagnosis not present

## 2023-11-06 LAB — BASIC METABOLIC PANEL WITH GFR
Anion gap: 10 (ref 5–15)
BUN: 21 mg/dL (ref 8–23)
CO2: 26 mmol/L (ref 22–32)
Calcium: 9.3 mg/dL (ref 8.9–10.3)
Chloride: 97 mmol/L — ABNORMAL LOW (ref 98–111)
Creatinine, Ser: 1.31 mg/dL — ABNORMAL HIGH (ref 0.61–1.24)
GFR, Estimated: 53 mL/min — ABNORMAL LOW (ref >60)
Glucose, Bld: 276 mg/dL — ABNORMAL HIGH (ref 70–99)
Potassium: 3.4 mmol/L — ABNORMAL LOW (ref 3.5–5.1)
Sodium: 133 mmol/L — ABNORMAL LOW (ref 135–145)

## 2023-11-06 LAB — CBC
HCT: 43.3 % (ref 39.0–52.0)
Hemoglobin: 14.8 g/dL (ref 13.0–17.0)
MCH: 33 pg (ref 26.0–34.0)
MCHC: 34.2 g/dL (ref 30.0–36.0)
MCV: 96.4 fL (ref 80.0–100.0)
Platelets: 171 K/uL (ref 150–400)
RBC: 4.49 MIL/uL (ref 4.22–5.81)
RDW: 13.4 % (ref 11.5–15.5)
WBC: 21.1 K/uL — ABNORMAL HIGH (ref 4.0–10.5)
nRBC: 0 % (ref 0.0–0.2)

## 2023-11-06 LAB — GLUCOSE, CAPILLARY
Glucose-Capillary: 183 mg/dL — ABNORMAL HIGH (ref 70–99)
Glucose-Capillary: 184 mg/dL — ABNORMAL HIGH (ref 70–99)
Glucose-Capillary: 208 mg/dL — ABNORMAL HIGH (ref 70–99)
Glucose-Capillary: 155 mg/dL — ABNORMAL HIGH (ref 70–99)

## 2023-11-06 LAB — PROCALCITONIN: Procalcitonin: 0.15 ng/mL

## 2023-11-06 LAB — BRAIN NATRIURETIC PEPTIDE: B Natriuretic Peptide: 243.7 pg/mL — ABNORMAL HIGH (ref 0.0–100.0)

## 2023-11-06 MED ORDER — ONDANSETRON HCL 4 MG PO TABS: 4.0000 mg | ORAL_TABLET | Freq: Four times a day (QID) | ORAL | Status: DC | PRN

## 2023-11-06 MED ORDER — TAMSULOSIN HCL 0.4 MG PO CAPS
0.4000 mg | ORAL_CAPSULE | Freq: Every day | ORAL | Status: DC
  Administered 2023-11-06 – 2023-11-07 (×2): 0.4 mg via ORAL
  Filled 2023-11-06 (×2): qty 1

## 2023-11-06 MED ORDER — SODIUM CHLORIDE 0.9% FLUSH
3.0000 mL | Freq: Two times a day (BID) | INTRAVENOUS | Status: DC
  Administered 2023-11-06 – 2023-11-08 (×5): 3 mL via INTRAVENOUS

## 2023-11-06 MED ORDER — APIXABAN 5 MG PO TABS
5.0000 mg | ORAL_TABLET | Freq: Two times a day (BID) | ORAL | Status: DC
  Administered 2023-11-06 – 2023-11-08 (×5): 5 mg via ORAL
  Filled 2023-11-06 (×5): qty 1

## 2023-11-06 MED ORDER — SODIUM CHLORIDE 0.9 % IV SOLN
1.0000 g | INTRAVENOUS | Status: DC
  Administered 2023-11-06 – 2023-11-07 (×2): 1 g via INTRAVENOUS
  Filled 2023-11-06 (×3): qty 10

## 2023-11-06 MED ORDER — ACEBUTOLOL HCL 200 MG PO CAPS
200.0000 mg | ORAL_CAPSULE | Freq: Two times a day (BID) | ORAL | Status: DC
  Administered 2023-11-06 – 2023-11-08 (×5): 200 mg via ORAL
  Filled 2023-11-06 (×5): qty 1

## 2023-11-06 MED ORDER — ACETAMINOPHEN 325 MG PO TABS
650.0000 mg | ORAL_TABLET | Freq: Four times a day (QID) | ORAL | Status: DC | PRN
  Administered 2023-11-06: 650 mg via ORAL
  Filled 2023-11-06: qty 2

## 2023-11-06 MED ORDER — INSULIN ASPART 100 UNIT/ML IJ SOLN
0.0000 [IU] | Freq: Three times a day (TID) | INTRAMUSCULAR | Status: DC
  Administered 2023-11-06: 3 [IU] via SUBCUTANEOUS
  Administered 2023-11-06 – 2023-11-07 (×4): 2 [IU] via SUBCUTANEOUS
  Administered 2023-11-07: 7 [IU] via SUBCUTANEOUS
  Administered 2023-11-08: 3 [IU] via SUBCUTANEOUS
  Administered 2023-11-08: 1 [IU] via SUBCUTANEOUS

## 2023-11-06 MED ORDER — SENNOSIDES-DOCUSATE SODIUM 8.6-50 MG PO TABS
1.0000 | ORAL_TABLET | Freq: Every evening | ORAL | Status: DC | PRN
Start: 2023-11-06 — End: 2023-11-08

## 2023-11-06 MED ORDER — EZETIMIBE 10 MG PO TABS
10.0000 mg | ORAL_TABLET | Freq: Every day | ORAL | Status: DC
  Administered 2023-11-06 – 2023-11-08 (×3): 10 mg via ORAL
  Filled 2023-11-06 (×4): qty 1

## 2023-11-06 MED ORDER — INSULIN ASPART 100 UNIT/ML IJ SOLN: 0.0000 [IU] | Freq: Every day | INTRAMUSCULAR | Status: DC

## 2023-11-06 MED ORDER — EMPAGLIFLOZIN 25 MG PO TABS: 25.0000 mg | ORAL_TABLET | Freq: Every day | ORAL | Status: DC

## 2023-11-06 MED ORDER — POTASSIUM CHLORIDE 20 MEQ PO PACK
60.0000 meq | PACK | Freq: Once | ORAL | Status: AC
  Administered 2023-11-06: 60 meq via ORAL
  Filled 2023-11-06: qty 3

## 2023-11-06 MED ORDER — IRBESARTAN 150 MG PO TABS
150.0000 mg | ORAL_TABLET | Freq: Every day | ORAL | Status: DC
  Administered 2023-11-06 – 2023-11-08 (×3): 150 mg via ORAL
  Filled 2023-11-06 (×3): qty 1

## 2023-11-06 MED ORDER — ESCITALOPRAM OXALATE 10 MG PO TABS
10.0000 mg | ORAL_TABLET | Freq: Every day | ORAL | Status: DC
  Administered 2023-11-06 – 2023-11-08 (×3): 10 mg via ORAL
  Filled 2023-11-06 (×3): qty 1

## 2023-11-06 MED ORDER — ATORVASTATIN CALCIUM 40 MG PO TABS
80.0000 mg | ORAL_TABLET | Freq: Every day | ORAL | Status: DC
Start: 2023-11-06 — End: 2023-11-08
  Administered 2023-11-06 – 2023-11-08 (×3): 80 mg via ORAL
  Filled 2023-11-06 (×3): qty 2

## 2023-11-06 MED ORDER — ONDANSETRON HCL 4 MG/2ML IJ SOLN: 4.0000 mg | Freq: Four times a day (QID) | INTRAMUSCULAR | Status: DC | PRN

## 2023-11-06 MED ORDER — ACETAMINOPHEN 650 MG RE SUPP: 650.0000 mg | Freq: Four times a day (QID) | RECTAL | Status: DC | PRN

## 2023-11-06 NOTE — ED Provider Notes (Signed)
 [facility] Provider Note   CSN: [id number] Arrival date & time: 11/05/23  1816     Patient presents with: Weakness   AEON KESSNER is a 89 y.o. male hyperlipidemia, type 2 diabetes, paroxysmal A-fib on Eliquis  presents to the emerged from today for evaluation of generalized weakness.  Daughter is present at bedside and helps contribute to the story.  She reports that he is at his baseline mentation.  She reports that he was complaining of bilateral lower leg swelling and gave him a Lasix .  He was producing more urine.  She has left.  When his son came into the house he noticed that the patient was unable to lift himself off the bed and had urinated on himself and continued to have multiple other urinary episodes.  The patient has no complaints.  Given that he was unable to stand up and was having some weakness, was concerned for possible UTI.  The patient denies any headache, vision changes, belly pain, nausea, vomiting, dysuria, chest pain, shortness of breath, cough, runny nose, nasal congestion.  Weakness Associated symptoms: frequency and urgency   Associated symptoms: no abdominal pain, no chest pain, no diarrhea, no dysuria, no fever, no nausea, no shortness of breath and no vomiting        Prior to Admission medications   Medication Sig Start Date End Date Taking? Authorizing Provider  acebutolol  (SECTRAL ) 200 MG capsule Take 1 capsule (200 mg total) by mouth 2 (two) times daily. 02/05/23   Ladona Heinz, MD  acetaminophen  (TYLENOL ) 325 MG tablet Take 2 tablets (650 mg total) by mouth every 4 (four) hours as needed for mild pain (or temp > 37.5 C (99.5 F)). 10/12/22   Angiulli, Toribio PARAS, PA-C  apixaban  (ELIQUIS ) 5 MG TABS tablet Take 1 tablet (5 mg total) by mouth 2 (two) times daily. 10/18/23   Raulkar, Sven SQUIBB, MD  atorvastatin  (LIPITOR ) 80 MG tablet Take 1 tablet (80 mg total) by mouth daily. 10/12/22   Angiulli, Toribio PARAS, PA-C  empagliflozin   (JARDIANCE ) 25 MG TABS tablet Take 1 tablet (25 mg total) by mouth daily. 10/12/22   Angiulli, Toribio PARAS, PA-C  escitalopram  (LEXAPRO ) 10 MG tablet Take 10 mg by mouth daily. 11/21/22   [provider]  ezetimibe  (ZETIA ) 10 MG tablet Take 10 mg by mouth daily. 11/21/22   [provider]  furosemide  (LASIX ) 40 MG tablet Take 1 tablet (40 mg total) by mouth daily as needed for edema. 12/28/22   Ladona Heinz, MD  glucose blood test strip USE 1 STRIP TO CHECK GLUCOSE 2 TO 4 TIMES DAILY    [provider]  METFORMIN HCL PO     [provider]  mirabegron  ER (MYRBETRIQ ) 50 MG TB24 tablet Take 1 tablet (50 mg total) by mouth daily. 10/12/22   Angiulli, Daniel J, PA-C  mupirocin  ointment (BACTROBAN ) 2 % Apply 1 Application topically 2 (two) times daily. 11/21/22   [provider]  olmesartan  (BENICAR ) 20 MG tablet Take 1 tablet (20 mg total) by mouth daily. 10/12/22   Angiulli, Toribio PARAS, PA-C  ONETOUCH VERIO test strip 1 each by Other route daily. 11/15/22   [provider]  tadalafil  (CIALIS ) 5 MG tablet Take 1 tablet (5 mg total) by mouth daily. 10/12/22   Angiulli, Toribio PARAS, PA-C  tamsulosin  (FLOMAX ) 0.4 MG CAPS capsule Take 1 capsule (0.4 mg total) by mouth daily after supper. 10/12/22   Angiulli, Toribio PARAS, PA-C  TRESIBA FLEXTOUCH 200 UNIT/ML FlexTouch Pen Inject into the skin. 11/24/22   [provider]  Vitamin D , Ergocalciferol , (DRISDOL ) 1.25 MG (50000 UNIT) CAPS capsule Take 1 capsule (50,000 Units total) by mouth every 7 (seven) days. 12/13/22   Lorilee Sven SQUIBB, MD    Allergies: Ketorolac tromethamine    Review of Systems  Constitutional:  Negative for chills and fever.  HENT:  Negative for congestion and rhinorrhea.   Respiratory:  Negative for shortness of breath.   Cardiovascular:  Negative for chest pain.  Gastrointestinal:  Negative for abdominal pain, constipation, diarrhea, nausea and vomiting.  Genitourinary:  Positive for frequency and  urgency. Negative for decreased urine volume and dysuria.  Neurological:  Positive for weakness. Negative for syncope.    Updated Vital Signs BP (!) 144/70 (BP Location: Left Arm)   Pulse 85   Temp 98.1 F (36.7 C)   Resp 20   Ht 5' 9 (1.753 m)   Wt 77.1 kg   SpO2 96%   BMI 25.10 kg/m   Physical Exam Vitals and nursing note reviewed. Exam conducted with a chaperone present.  Constitutional:      General: He is not in acute distress.    Appearance: He is not ill-appearing or toxic-appearing.  HENT:     Mouth/Throat:     Mouth: Mucous membranes are moist.  Eyes:     General: No scleral icterus. Cardiovascular:     Rate and Rhythm: Normal rate.     Pulses: Normal pulses.  Pulmonary:     Effort: Pulmonary effort is normal. No respiratory distress.     Breath sounds: Normal breath sounds. No wheezing.  Abdominal:     Palpations: Abdomen is soft.     Tenderness: There is no abdominal tenderness. There is no guarding or rebound.  Musculoskeletal:     Cervical back: Normal range of motion. No rigidity.     Right lower leg: Edema present.     Left lower leg: Edema present.     Comments: 1+ pitting edema  Skin:    General: Skin is warm and dry.     Comments: No skin breakdown or rash noted.   Neurological:     General: No focal deficit present.     Mental Status: He is alert.     Cranial Nerves: No cranial nerve deficit.     Comments: Generalized weakness but no focal weakness noted.  Sensation reportedly intact.  Grip strength equal.  Patient unable to get up out of bed.  Patient is oriented to person and place but not year.  Daughter reports is at baseline.     (all labs ordered are listed, but only abnormal results are displayed) Labs Reviewed  COMPREHENSIVE METABOLIC PANEL WITH GFR - Abnormal; Notable for the following components:      Result Value   Sodium 132 (*)    Chloride 95 (*)    Glucose, Bld 261 (*)    Creatinine, Ser 1.29 (*)    GFR, Estimated 54 (*)     All other components within normal limits  CBC - Abnormal; Notable for the following components:   WBC 21.1 (*)    All other components within normal limits  URINALYSIS, ROUTINE W REFLEX MICROSCOPIC - Abnormal; Notable for the following components:   Color, Urine STRAW (*)    Glucose, UA >=500 (*)    Protein, ur 30 (*)    All other components within normal limits  BRAIN NATRIURETIC PEPTIDE - Abnormal; Notable  for the following components:   B Natriuretic Peptide 243.7 (*)    All other components within normal limits  CBG MONITORING, ED - Abnormal; Notable for the following components:   Glucose-Capillary 277 (*)    All other components within normal limits  RESP PANEL BY RT-PCR (RSV, FLU A&B, COVID)  RVPGX2  CULTURE, BLOOD (ROUTINE X 2)  CULTURE, BLOOD (ROUTINE X 2)  URINE CULTURE  LACTIC ACID, PLASMA  LACTIC ACID, PLASMA  BASIC METABOLIC PANEL WITH GFR  CBC    EKG: EKG Interpretation Date/Time:  Sunday November 05 2023 18:25:26 EDT Ventricular Rate:  92 PR Interval:  247 QRS Duration:  90 QT Interval:  383 QTC Calculation: 474 R Axis:   49  Text Interpretation: Sinus rhythm Atrial premature complexes Prolonged PR interval Left ventricular hypertrophy Non-specific ST-t changes Confirmed by Bernard Drivers ([id number]) on 11/05/2023 7:38:35 PM  Radiology: CT CHEST ABDOMEN PELVIS W CONTRAST Result Date: 11/05/2023 EXAM: CT CHEST, ABDOMEN AND PELVIS WITH CONTRAST 11/05/2023 09:06:21 PM TECHNIQUE: CT of the chest, abdomen and pelvis was performed with the administration of intravenous contrast. Multiplanar reformatted images are provided for review. Automated exposure control, iterative reconstruction, and/or weight based adjustment of the mA/kV was utilized to reduce the radiation dose to as low as reasonably achievable. COMPARISON: CT abdomen and pelvis dated 09/09/2023 and CTA chest dated 12/29/2021. CLINICAL HISTORY: Sepsis. Table formatting from the original note was not included. Images  from the original note were not included. Pt bib EMS for onset of weakness through the day. Started the day using walker and was at baseline. This afternoon he was unable to get up and use walker. Took PRN lasix  this morning not normal occurrence. Pt had swelling in legs. Missed eliquis  for 2 nights, BP meds, and enlarged prostate. CBG 183. FINDINGS: CHEST: MEDIASTINUM: Mild cardiomegaly. Postsurgical changes related to prior CABG. Median sternotomy. THORACIC LYMPH NODES: No mediastinal, hilar or axillary lymphadenopathy. LUNGS AND PLEURA: Mild dependent atelectasis in the posterior right middle lobe, lingula, and bilateral lower lobes. No focal consolidation or pulmonary edema. No pleural effusion or pneumothorax. ABDOMEN AND PELVIS: LIVER: The liver is unremarkable. GALLBLADDER AND BILE DUCTS: Layering small gallstones (image 57), without associated inflammatory changes. No biliary ductal dilatation. SPLEEN: No acute abnormality. PANCREAS: No acute abnormality. ADRENAL GLANDS: No acute abnormality. KIDNEYS, URETERS AND BLADDER: 3.0 cm simple right upper pole renal cyst (image 54), benign (Bosniak 1). Per consensus, no follow-up is needed for simple Bosniak type 1 and 2 renal cysts, unless the patient has a malignancy history or risk factors. No stones in the kidneys or ureters. No hydronephrosis. No perinephric or periureteral stranding. Thick walled bladder with mild perivesical stranding (image 102), possibly reflecting sequela of chronic bladder outlet obstruction, although superimposed cystitis as possible. GI AND BOWEL: Stomach demonstrates no acute abnormality. There is no bowel obstruction. Appendix is not discretely visualized. REPRODUCTIVE ORGANS: Prostatomegaly, with enlargement of the central gland indenting the base of the bladder, suggesting BPH. PERITONEUM AND RETROPERITONEUM: No ascites. No free air. VASCULATURE: Atherosclerotic calcifications of the abdominal aorta and branch vessels, although  patent. Thoracic aortic atherosclerosis. ABDOMINAL AND PELVIS LYMPH NODES: No lymphadenopathy. BONES AND SOFT TISSUES: Healing right posterior tenth and 11th rib fractures. Healed right transverse process fractures at L1-3. Degenerative changes of the visualized thoracolumbar spine. IMPRESSION: 1. No acute findings in the chest. 2. Thick-walled bladder with mild perivesical stranding, possibly reflecting sequela of chronic bladder outlet obstruction or superimposed cystitis. 3. Prostatomegaly. Electronically signed by: Sriyesh Krishnan  MD 11/05/2023 09:19 PM EDT RP Workstation: [id number]   CT Head Wo Contrast Result Date: 11/05/2023 EXAM: CT HEAD WITHOUT CONTRAST 11/05/2023 09:06:21 PM TECHNIQUE: CT of the head was performed without the administration of intravenous contrast. Automated exposure control, iterative reconstruction, and/or weight based adjustment of the mA/kV was utilized to reduce the radiation dose to as low as reasonably achievable. COMPARISON: 09/09/2023 CLINICAL HISTORY: Mental status change, unknown cause. Pt bib EMS for onset of weakness through the day. Started the day using walker and was at baseline. This afternoon he was unable to get up and use walker. Took PRN lasix  this morning not normal occurrence. Pt had swelling in legs. Missed eliquis  for 2 nights, BP meds, and enlarged prostate. CBG 183. FINDINGS: BRAIN AND VENTRICLES: No acute hemorrhage. Gray-white differentiation is preserved. No extra-axial collection. No mass effect or midline shift. Global cortical and central atrophy. Stable ventriculomegaly. Mild subcortical and periventricular small vessel ischemic changes. Mild intracranial atherosclerosis. ORBITS: No acute abnormality. SINUSES: Mild mucosal thickening of the bilateral maxillary sinuses. SOFT TISSUES AND SKULL: No acute soft tissue abnormality. No skull fracture. IMPRESSION: 1. No acute intracranial abnormality. 2. Atrophy with stable ventriculomegaly. Small vessel  ischemic changes. Electronically signed by: Pinkie Pebbles MD 11/05/2023 09:13 PM EDT RP Workstation: [id number]   DG Chest Port 1 View Result Date: 11/05/2023 CLINICAL DATA:  Weakness. EXAM: PORTABLE CHEST 1 VIEW COMPARISON:  November 29, 2022 FINDINGS: Multiple sternal wires and vascular clips are present. The heart size and mediastinal contours are within normal limits. A radiopaque loop recorder device is in place. Low lung volumes are noted. Mild linear scarring and/or atelectasis is seen within the left lung base. The visualized skeletal structures are unremarkable. IMPRESSION: 1. Evidence of prior median sternotomy/CABG. 2. Mild left basilar linear scarring and/or atelectasis. Electronically Signed   By: Suzen Dials M.D.   On: 11/05/2023 19:56    Procedures   Medications Ordered in the ED  sodium chloride  flush (NS) 0.9 % injection 3 mL (has no administration in time range)  acetaminophen  (TYLENOL ) tablet 650 mg (has no administration in time range)    Or  acetaminophen  (TYLENOL ) suppository 650 mg (has no administration in time range)  ondansetron  (ZOFRAN ) tablet 4 mg (has no administration in time range)    Or  ondansetron  (ZOFRAN ) injection 4 mg (has no administration in time range)  senna-docusate (Senokot-S) tablet 1 tablet (has no administration in time range)  acebutolol  (SECTRAL ) capsule 200 mg (has no administration in time range)  apixaban  (ELIQUIS ) tablet 5 mg (has no administration in time range)  atorvastatin  (LIPITOR ) tablet 80 mg (has no administration in time range)  empagliflozin  (JARDIANCE ) tablet 25 mg (has no administration in time range)  escitalopram  (LEXAPRO ) tablet 10 mg (has no administration in time range)  ezetimibe  (ZETIA ) tablet 10 mg (has no administration in time range)  irbesartan  (AVAPRO ) tablet 150 mg (has no administration in time range)  tamsulosin  (FLOMAX ) capsule 0.4 mg (has no administration in time range)  insulin  aspart (novoLOG )  injection 0-9 Units (has no administration in time range)  insulin  aspart (novoLOG ) injection 0-5 Units (has no administration in time range)  acetaminophen  (TYLENOL ) tablet 650 mg (650 mg Oral Given 11/05/23 1952)  iohexol  (OMNIPAQUE ) 300 MG/ML solution 100 mL (100 mLs Intravenous Contrast Given 11/05/23 2104)  cefTRIAXone  (ROCEPHIN ) 1 g in sodium chloride  0.9 % 100 mL IVPB (1 g Intravenous New Bag/Given 11/05/23 2215)    Medical Decision Making Amount and/or Complexity of Data Reviewed Labs:  ordered. Radiology: ordered.  Risk OTC drugs. Prescription drug management. Decision regarding hospitalization.   89 y.o. male presents to the ER for evaluation of generalized weakness. Differential diagnosis includes but is not limited to CVA, spinal cord injury, ACS, arrhythmia, syncope, orthostatic hypotension, sepsis, hypoglycemia, hypoxia, electrolyte disturbance, endocrine disorder, anemia, environmental exposure, polypharmacy. Vital signs mildly elevated blood pressure otherwise unremarkable.  Patient does feel warm so rectal temperature was performed and shows temperature of 101.43F. Physical exam as noted above.   Given white count of 21 as well as elevated fever, lactic and blood cultures were added.  Question of this is likely more urine in etiology.  I independently reviewed and interpreted the patient's labs.  Urinalysis straw-colored urine with greater than 5 glucose present.  30 protein otherwise unremarkable.  Will add on urine culture.  Urine is likely dilute from Lasix , could be hiding infection.  Lactic acid within normal limits.  BNP slightly elevated 243.7.  He has no chest pain or shortness of breath.  CBC does show leukocytosis of 21.1.  No anemia.  CMP shows sodium 132 however likely pseudohyponatremia given glucose of 261.  Creatinine 1.29 with a chloride of 95.  No other electrolyte or LFT abnormality.  Blood cultures pending.  COVID, flu, RSV negative..  Given his urine is not  showing signs of infection, I did order CT chest abdomen pelvis as well as a CT of the head as well.  CT head 1. No acute intracranial abnormality. 2. Atrophy with stable ventriculomegaly. Small vessel ischemic changes. Per radiologist's interpretation.    CT chest on pelvis  1. No acute intracranial abnormality. 2. Atrophy with stable ventriculomegaly. Small vessel ischemic changes. Per radiologist's interpretation.    Chest x-ray 1. Evidence of prior median sternotomy/CABG. 2. Mild left basilar linear scarring and/or atelectasis. Per radiologist's interpretation.    Discussed case with my attending.  Agrees with ordering Rocephin  for presumed UTI given CT scan showing bladder wall thickening with some stranding.  I do not see other skin changes or other signs of infection.  No signs of cellulitis or ulceration to the body.  Peroneum without signs of Fournier's.  Does not think any other antibiotics are needed at this time.  Family is amenable to admission here the patient is generalized weakness and cannot ambulate out of bed and lives in independent living at [facility].  Family asked that son [name] be called if any questions from hospitalist.  I discussed this case with my attending physician who cosigned this note including patient's presenting symptoms, physical exam, and planned diagnostics and interventions. Attending physician stated agreement with plan or made changes to plan which were implemented.   Portions of this report may have been transcribed using voice recognition software. Every effort was made to ensure accuracy; however, inadvertent computerized transcription errors may be present.    Final diagnoses:  SIRS (systemic inflammatory response syndrome) (HCC)  Acute cystitis without hematuria    ED Discharge Orders     None          Bernis Ernst, [place] 11/06/23 0028    Bernard Drivers, MD 11/06/23 [phone]

## 2023-11-06 NOTE — Plan of Care (Signed)
  Problem: Activity: Goal: Risk for activity intolerance will decrease Outcome: Progressing   Problem: Coping: Goal: Level of anxiety will decrease Outcome: Progressing   Problem: Pain Managment: Goal: General experience of comfort will improve and/or be controlled Outcome: Progressing   Problem: Safety: Goal: Ability to remain free from injury will improve Outcome: Progressing   Problem: Coping: Goal: Ability to adjust to condition or change in health will improve Outcome: Progressing

## 2023-11-06 NOTE — Progress Notes (Addendum)
 PROGRESS NOTE    [name]  FMW:[id number] DOB: Jun 29, 1934 DOA: 11/05/2023 PCP: Loreli Elsie JONETTA Mickey., MD    Brief Narrative:   [name] is a 89 y.o. male with past medical history significant for CAD s/p CABG, PAF on Eliquis , history of CVA, CKD stage IIIa, T2DM, HTN, HLD, BPH, depression/anxiety, MCI who presented to the ED from independent living for evaluation of generalized weakness. Patient states for the last 2 weeks he has been feeling generally weak, mostly feels weak in both legs.  He has had some falls without injury.  He has not hit his head or lost consciousness.  He says he also feels dizzy at times.  This can occur at rest or with activity but more noticeable when he changes positions.  He has not had any chest pain, dyspnea, cough, nausea, vomit, abdominal pain, diarrhea, dysuria.  He says he saw some swelling in his legs earlier for which he takes Lasix  as needed.  He reports good urine output without dysuria.  He says he normally ambulates with use of a walker.   In the ED, BP 154/86, pulse 94, RR 18, temp 98.7 F, SpO2 95% on room air.  Tmax 101.2 F rectally. WBC 21.1, hemoglobin 14.5, platelets 198, sodium 132, potassium 3.8, bicarb 26, BUN 20, creatinine 1.29, serum glucose 261, LFTs within normal limits, lactic acid 1.4. UA showed >500 glucose, negative ketones, 30 protein, negative nitrates or leukocytes, 0-5 RBCs and WBCs, no bacteria.  Blood and urine cultures in process.  SARS-CoV-2, influenza, RSV PCR negative. CT head without contrast negative for acute intracranial abnormality.  Atrophy with stable ventriculomegaly and small vessel ischemic changes noted. CT chest/abdomen/pelvis with contrast negative for acute findings in the chest.  Thick-walled bladder with mild perivesical stranding noted, possibly reflecting sequela of chronic bladder outlet obstruction or superimposed cystitis.  Prostatomegaly noted. Patient was given IV ceftriaxone .  The hospitalist  service was consulted to admit.  Assessment & Plan:   SIRS: Patient presenting with leukocytosis and fever.  No obvious infectious process.  COVID, influenza, RSV panel negative.  CT A/P shows thickened wall bladder with mild perivesicular stranding sequelae of chronic bladder outlet obstruction versus superimposed cystitis, prostatomegaly. -- WBC 21.1>21.1 -- Urine culture: Pending -- Blood culture: Pending -- Ceftriaxone  1 g IV every 24 hours -- Monitor fever curve, supportive care -- CBC daily  Generalized weakness: Generally weak, feels more so in both legs.  No focal deficits.  Has had falls without injury or syncope.  Currently residing at [facility]. -- PT/OT eval: pending -- TOC consult -- Fall precautions  Hypokalemia Potassium 3.4, will replete. -- Repeat electrolytes in a.m.  Hyponatremia Etiology likely secondary to hypovolemic hyponatremia in setting of poor oral intake in days preceding hospitalization. -- Na 132>133 -- Continue to encourage increased oral intake -- BMP in am   Paroxysmal atrial fibrillation: In sinus rhythm with first-degree AV block on admission.   -- Acebutolol  200 mg PO BID -- Eliquis  5 mg PO BID   Insulin -dependent type 2 diabetes: Holding metformin and Jardiance  -- Sensitive SSI for coverage. -- CBG before every meal/at bedtime  CKD stage IIIa: -- Cr  Renal function stable.   Hypertension: -- Irbesartan  150 mg p.o. daily (substituted for home olmesartan )   History of CVA: Continue Eliquis , atorvastatin , Zetia .   CAD s/p CABG: Stable, denies chest pain.  Continue Eliquis  -- atorvastatin  80 mg PO daily -- Zetia  10 mg PO daily.   Hyperlipidemia: Continue  atorvastatin  and Zetia .   Depression/anxiety: Continue Lexapro  10 mg PO daily   BPH: Continue Flomax  0.4 mg PO daily.   Mild cognitive impairment: -- Delirium precautions -- Get up during the day -- Encourage a familiar face to remain present throughout the  day -- Keep blinds open and lights on during daylight hours -- Minimize the use of opioids/benzodiazepines   DVT prophylaxis:  apixaban  (ELIQUIS ) tablet 5 mg    Code Status: Do not attempt resuscitation (DNR) PRE-ARREST INTERVENTIONS DESIRED Family Communication: Updated family present at bedside this morning  Disposition Plan:  Level of care: Telemetry Status is: Observation The patient remains OBS appropriate and will d/c before 2 midnights.    Consultants:  None  Procedures:  None  Antimicrobials:  Ceftriaxone  7/20>>   Subjective: Patient seen examined bedside, lying in bed.  Slightly confused.  No specific complaints this morning.  Updated family present at bedside.  Currently remains afebrile.  Unclear etiology of leukocytosis and fever, awaiting urine and blood culture results.  Will continue IV ceftriaxone .  Awaiting PT/OT evaluation.  Denies headache, no chest pain, no shortness of breath, no abdominal pain, no fever/chills/night sweats, no nausea/vomiting/diarrhea.  No acute events overnight per nursing staff.  Objective: Vitals:   11/05/23 2326 11/06/23 0323 11/06/23 0444 11/06/23 0743  BP: (!) 144/70 (!) 166/94  (!) 148/83  Pulse: 85 (!) 103  (!) 108  Resp: 20 20  16   Temp: 98.1 F (36.7 C) 99 F (37.2 C)  99.1 F (37.3 C)  TempSrc:    Oral  SpO2: 96% 91%  94%  Weight:   74.2 kg   Height:        Intake/Output Summary (Last 24 hours) at 11/06/2023 1039 Last data filed at 11/06/2023 0800 Gross per 24 hour  Intake 480 ml  Output 675 ml  Net -195 ml   Filed Weights   11/05/23 1826 11/06/23 0444  Weight: 77.1 kg 74.2 kg    Examination:  Physical Exam: GEN: NAD, alert and oriented to self/place [facility]), but not year (1972) and no answer to president HEENT: NCAT, PERRL, EOMI, sclera clear, MMM PULM: CTAB w/o wheezes/crackles, normal respiratory effort, on room air CV: RRR w/o M/G/R GI: abd soft, NTND, + BS MSK: no peripheral edema, moves all  extremities independently NEURO: No focal neurological deficit PSYCH: normal mood/affect Integumentary: No concerning rashes/lesions/wounds noted on exposed skin surfaces    Data Reviewed: I have personally reviewed following labs and imaging studies  CBC: Recent Labs  Lab 11/05/23 1844 11/06/23 0527  WBC 21.1* 21.1*  HGB 14.5 14.8  HCT 43.2 43.3  MCV 97.1 96.4  PLT 198 171   Basic Metabolic Panel: Recent Labs  Lab 11/05/23 1844 11/06/23 0527  NA 132* 133*  K 3.8 3.4*  CL 95* 97*  CO2 26 26  GLUCOSE 261* 276*  BUN 20 21  CREATININE 1.29* 1.31*  CALCIUM  9.3 9.3   GFR: Estimated Creatinine Clearance: 39.7 mL/min (A) (by C-G formula based on SCr of 1.31 mg/dL (H)). Liver Function Tests: Recent Labs  Lab 11/05/23 1844  AST 34  ALT 26  ALKPHOS 94  BILITOT 1.0  PROT 6.9  ALBUMIN 3.7   No results for input(s): LIPASE, AMYLASE in the last 168 hours. No results for input(s): AMMONIA in the last 168 hours. Coagulation Profile: No results for input(s): INR, PROTIME in the last 168 hours. Cardiac Enzymes: No results for input(s): CKTOTAL, CKMB, CKMBINDEX, TROPONINI in the last 168 hours. BNP (last 3  results) No results for input(s): PROBNP in the last 8760 hours. HbA1C: No results for input(s): HGBA1C in the last 72 hours. CBG: Recent Labs  Lab 11/05/23 1904 11/06/23 0833  GLUCAP 277* 183*   Lipid Profile: No results for input(s): CHOL, HDL, LDLCALC, TRIG, CHOLHDL, LDLDIRECT in the last 72 hours. Thyroid  Function Tests: No results for input(s): TSH, T4TOTAL, FREET4, T3FREE, THYROIDAB in the last 72 hours. Anemia Panel: No results for input(s): VITAMINB12, FOLATE, FERRITIN, TIBC, IRON, RETICCTPCT in the last 72 hours. Sepsis Labs: Recent Labs  Lab 11/05/23 2008 11/05/23 2150 11/06/23 0527  PROCALCITON  --   --  0.15  LATICACIDVEN 1.4 1.0  --     Recent Results (from the past 240 hours)  Blood  culture (routine x 2)     Status: None (Preliminary result)   Collection Time: 11/05/23  7:55 PM   Specimen: BLOOD  Result Value Ref Range Status   Specimen Description   Final    BLOOD BLOOD LEFT ARM Performed at [facility], [address], [place], [place] [address]    Special Requests   Final    BOTTLES DRAWN AEROBIC AND ANAEROBIC Blood Culture adequate volume Performed at [facility], [address], [place], [place] [address]    Culture   Final    NO GROWTH < 12 HOURS Performed at [facility], [address], [place], [place] [address]    Report Status PENDING  Incomplete  Resp panel by RT-PCR (RSV, Flu A&B, Covid) Anterior Nasal Swab     Status: None   Collection Time: 11/05/23  8:16 PM   Specimen: Anterior Nasal Swab  Result Value Ref Range Status   SARS Coronavirus 2 by RT PCR NEGATIVE NEGATIVE Final    Comment: (NOTE) SARS-CoV-2 target nucleic acids are NOT DETECTED.  The SARS-CoV-2 RNA is generally detectable in upper respiratory specimens during the acute phase of infection. The lowest concentration of SARS-CoV-2 viral copies this assay can detect is 138 copies/mL. A negative result does not preclude SARS-Cov-2 infection and should not be used as the sole basis for treatment or other patient management decisions. A negative result may occur with  improper specimen collection/handling, submission of specimen other than nasopharyngeal swab, presence of viral mutation(s) within the areas targeted by this assay, and inadequate number of viral copies(<138 copies/mL). A negative result must be combined with clinical observations, patient history, and epidemiological information. The expected result is Negative.  Fact Sheet for Patients:  BloggerCourse.com  Fact Sheet for Healthcare Providers:  SeriousBroker.it  This test is no t yet approved or cleared by the [place]   FDA and  has been authorized for detection and/or diagnosis of SARS-CoV-2 by FDA under an Emergency Use Authorization (EUA). This EUA will remain  in effect (meaning this test can be used) for the duration of the COVID-19 declaration under Section 564(b)(1) of the Act, 21 U.S.C.section 360bbb-3(b)(1), unless the authorization is terminated  or revoked sooner.       Influenza A by PCR NEGATIVE NEGATIVE Final   Influenza B by PCR NEGATIVE NEGATIVE Final    Comment: (NOTE) The Xpert Xpress SARS-CoV-2/FLU/RSV plus assay is intended as an aid in the diagnosis of influenza from Nasopharyngeal swab specimens and should not be used as a sole basis for treatment. Nasal washings and aspirates are unacceptable for Xpert Xpress SARS-CoV-2/FLU/RSV testing.  Fact Sheet for Patients: BloggerCourse.com  Fact Sheet for Healthcare Providers: SeriousBroker.it  This test is not  yet approved or cleared by the [place]  FDA and has been authorized for detection and/or diagnosis of SARS-CoV-2 by FDA under an Emergency Use Authorization (EUA). This EUA will remain in effect (meaning this test can be used) for the duration of the COVID-19 declaration under Section 564(b)(1) of the Act, 21 U.S.C. section 360bbb-3(b)(1), unless the authorization is terminated or revoked.     Resp Syncytial Virus by PCR NEGATIVE NEGATIVE Final    Comment: (NOTE) Fact Sheet for Patients: BloggerCourse.com  Fact Sheet for Healthcare Providers: SeriousBroker.it  This test is not yet approved or cleared by the [place]  FDA and has been authorized for detection and/or diagnosis of SARS-CoV-2 by FDA under an Emergency Use Authorization (EUA). This EUA will remain in effect (meaning this test can be used) for the duration of the COVID-19 declaration under Section 564(b)(1) of the Act, 21 U.S.C. section  360bbb-3(b)(1), unless the authorization is terminated or revoked.  Performed at [facility], [address] [place], [place] [address]   Blood culture (routine x 2)     Status: None (Preliminary result)   Collection Time: 11/05/23  9:50 PM   Specimen: BLOOD  Result Value Ref Range Status   Specimen Description   Final    BLOOD RIGHT ANTECUBITAL Performed at [facility], [address], [place], [place] [address]    Special Requests   Final    BOTTLES DRAWN AEROBIC AND ANAEROBIC Blood Culture adequate volume Performed at [facility], [address], [place], [place] [address]    Culture   Final    NO GROWTH < 12 HOURS Performed at [facility], [address] [place], [place] [address]    Report Status PENDING  Incomplete         Radiology Studies: CT CHEST ABDOMEN PELVIS W CONTRAST Result Date: 11/05/2023 EXAM: CT CHEST, ABDOMEN AND PELVIS WITH CONTRAST 11/05/2023 09:06:21 PM TECHNIQUE: CT of the chest, abdomen and pelvis was performed with the administration of intravenous contrast. Multiplanar reformatted images are provided for review. Automated exposure control, iterative reconstruction, and/or weight based adjustment of the mA/kV was utilized to reduce the radiation dose to as low as reasonably achievable. COMPARISON: CT abdomen and pelvis dated 09/09/2023 and CTA chest dated 12/29/2021. CLINICAL HISTORY: Sepsis. Table formatting from the original note was not included. Images from the original note were not included. Pt bib EMS for onset of weakness through the day. Started the day using walker and was at baseline. This afternoon he was unable to get up and use walker. Took PRN lasix  this morning not normal occurrence. Pt had swelling in legs. Missed eliquis  for 2 nights, BP meds, and enlarged prostate. CBG 183. FINDINGS: CHEST: MEDIASTINUM: Mild cardiomegaly. Postsurgical changes related to prior CABG. Median  sternotomy. THORACIC LYMPH NODES: No mediastinal, hilar or axillary lymphadenopathy. LUNGS AND PLEURA: Mild dependent atelectasis in the posterior right middle lobe, lingula, and bilateral lower lobes. No focal consolidation or pulmonary edema. No pleural effusion or pneumothorax. ABDOMEN AND PELVIS: LIVER: The liver is unremarkable. GALLBLADDER AND BILE DUCTS: Layering small gallstones (image 57), without associated inflammatory changes. No biliary ductal dilatation. SPLEEN: No acute abnormality. PANCREAS: No acute abnormality. ADRENAL GLANDS: No acute abnormality. KIDNEYS, URETERS AND BLADDER: 3.0 cm simple right upper pole renal cyst (image 54), benign (Bosniak 1). Per consensus, no follow-up is needed for simple Bosniak type 1 and 2 renal cysts, unless the patient has a malignancy history or risk factors. No  stones in the kidneys or ureters. No hydronephrosis. No perinephric or periureteral stranding. Thick walled bladder with mild perivesical stranding (image 102), possibly reflecting sequela of chronic bladder outlet obstruction, although superimposed cystitis as possible. GI AND BOWEL: Stomach demonstrates no acute abnormality. There is no bowel obstruction. Appendix is not discretely visualized. REPRODUCTIVE ORGANS: Prostatomegaly, with enlargement of the central gland indenting the base of the bladder, suggesting BPH. PERITONEUM AND RETROPERITONEUM: No ascites. No free air. VASCULATURE: Atherosclerotic calcifications of the abdominal aorta and branch vessels, although patent. Thoracic aortic atherosclerosis. ABDOMINAL AND PELVIS LYMPH NODES: No lymphadenopathy. BONES AND SOFT TISSUES: Healing right posterior tenth and 11th rib fractures. Healed right transverse process fractures at L1-3. Degenerative changes of the visualized thoracolumbar spine. IMPRESSION: 1. No acute findings in the chest. 2. Thick-walled bladder with mild perivesical stranding, possibly reflecting sequela of chronic bladder outlet  obstruction or superimposed cystitis. 3. Prostatomegaly. Electronically signed by: Pinkie Pebbles MD 11/05/2023 09:19 PM EDT RP Workstation: [id number]   CT Head Wo Contrast Result Date: 11/05/2023 EXAM: CT HEAD WITHOUT CONTRAST 11/05/2023 09:06:21 PM TECHNIQUE: CT of the head was performed without the administration of intravenous contrast. Automated exposure control, iterative reconstruction, and/or weight based adjustment of the mA/kV was utilized to reduce the radiation dose to as low as reasonably achievable. COMPARISON: 09/09/2023 CLINICAL HISTORY: Mental status change, unknown cause. Pt bib EMS for onset of weakness through the day. Started the day using walker and was at baseline. This afternoon he was unable to get up and use walker. Took PRN lasix  this morning not normal occurrence. Pt had swelling in legs. Missed eliquis  for 2 nights, BP meds, and enlarged prostate. CBG 183. FINDINGS: BRAIN AND VENTRICLES: No acute hemorrhage. Gray-white differentiation is preserved. No extra-axial collection. No mass effect or midline shift. Global cortical and central atrophy. Stable ventriculomegaly. Mild subcortical and periventricular small vessel ischemic changes. Mild intracranial atherosclerosis. ORBITS: No acute abnormality. SINUSES: Mild mucosal thickening of the bilateral maxillary sinuses. SOFT TISSUES AND SKULL: No acute soft tissue abnormality. No skull fracture. IMPRESSION: 1. No acute intracranial abnormality. 2. Atrophy with stable ventriculomegaly. Small vessel ischemic changes. Electronically signed by: Pinkie Pebbles MD 11/05/2023 09:13 PM EDT RP Workstation: [id number]   DG Chest Port 1 View Result Date: 11/05/2023 CLINICAL DATA:  Weakness. EXAM: PORTABLE CHEST 1 VIEW COMPARISON:  November 29, 2022 FINDINGS: Multiple sternal wires and vascular clips are present. The heart size and mediastinal contours are within normal limits. A radiopaque loop recorder device is in place. Low lung volumes are  noted. Mild linear scarring and/or atelectasis is seen within the left lung base. The visualized skeletal structures are unremarkable. IMPRESSION: 1. Evidence of prior median sternotomy/CABG. 2. Mild left basilar linear scarring and/or atelectasis. Electronically Signed   By: Suzen Dials M.D.   On: 11/05/2023 19:56        Scheduled Meds:  acebutolol   200 mg Oral BID   apixaban   5 mg Oral BID   atorvastatin   80 mg Oral Daily   escitalopram   10 mg Oral Daily   ezetimibe   10 mg Oral Daily   insulin  aspart  0-5 Units Subcutaneous QHS   insulin  aspart  0-9 Units Subcutaneous TID WC   irbesartan   150 mg Oral Daily   sodium chloride  flush  3 mL Intravenous Q12H   tamsulosin   0.4 mg Oral QPC supper   Continuous Infusions:  cefTRIAXone  (ROCEPHIN )  IV       LOS: 0 days    Time spent: 20  minutes spent on 11/06/2023 caring for this patient face-to-face including chart review, ordering labs/tests, documenting, discussion with nursing staff, consultants, updating family and interview/physical exam    Camellia PARAS [place], DO [facility] Available via Epic secure chat 7am-7pm After these hours, please refer to coverage provider listed on [email or web address] 11/06/2023, 10:39 AM

## 2023-11-06 NOTE — Evaluation (Addendum)
 Physical Therapy Evaluation Patient Details Name: [name] MRN: [id number] DOB: Oct 26, 1934 Today's Date: 11/06/2023  History of Present Illness  [name] is a 89 y.o. male generalized weakness. Admitted with dx of SIRS, Acute cystitis without hematuria PMH: hyperlipidemia, type 2 diabetes, paroxysmal A-fib on Eliquis   Clinical Impression  Pt admitted with above diagnosis. Pt reports he walks independently with a cane at baseline. He was oriented to self but not to month/year nor to situation. Pt required mod assist for supine to sit, and min assist for sitting balance 2* to a posterior lean. Stedy lift equipment was used for sit to stand and for dependent transfer to recliner. Patient will benefit from continued inpatient follow up therapy, <3 hours/day.  Pt currently with functional limitations due to the deficits listed below (see PT Problem List). Pt will benefit from acute skilled PT to increase their independence and safety with mobility to allow discharge.           If plan is discharge home, recommend the following: A lot of help with walking and/or transfers;A lot of help with bathing/dressing/bathroom;Assist for transportation;Help with stairs or ramp for entrance;Direct supervision/assist for medications management;Direct supervision/assist for financial management   Can travel by private vehicle   No    Equipment Recommendations None recommended by PT  Recommendations for Other Services       Functional Status Assessment Patient has had a recent decline in their functional status and demonstrates the ability to make significant improvements in function in a reasonable and predictable amount of time.     Precautions / Restrictions Precautions Precautions: Fall Recall of Precautions/Restrictions: Impaired Restrictions Weight Bearing Restrictions Per Provider Order: No      Mobility  Bed Mobility Overal bed mobility: Needs Assistance Bed Mobility: Supine  to Sit     Supine to sit: Mod assist     General bed mobility comments: assist to raise trunk    Transfers Overall transfer level: Needs assistance   Transfers: Sit to/from Stand, Bed to chair/wheelchair/BSC Sit to Stand: Via lift equipment, Min assist, From elevated surface           General transfer comment: min A to power up from elevated bed pulling up on Stedy; Pt able to maintain standing for ~5 seconds then  fatigued and sat on Stedy seat flaps; transfer to recliner with [employer] Transfer via Lift Equipment: Stedy  Ambulation/Gait               General Gait Details: unable  Careers information officer     Tilt Bed    Modified Rankin (Stroke Patients Only)       Balance Overall balance assessment: Needs assistance Sitting-balance support: Feet supported, Single extremity supported Sitting balance-Leahy Scale: Poor Sitting balance - Comments: LOB posteriorly; requires BUE/LE support Postural control: Posterior lean Standing balance support: Bilateral upper extremity supported, During functional activity Standing balance-Leahy Scale: Poor                               Pertinent Vitals/Pain Pain Assessment Pain Assessment: Faces Faces Pain Scale: Hurts little more Pain Location: R side with movement Pain Descriptors / Indicators: Grimacing Pain Intervention(s): Limited activity within patient's tolerance, Monitored during session, Repositioned    Home Living Family/patient expects to be discharged to:: Private residence Living Arrangements: Alone   Type of Home: Independent living facility  Home Access: Level entry       Home Layout: One level Home Equipment: Cane - single Librarian, academic (2 wheels);Shower seat Additional Comments: pt oriented to self, stated it is April 2024, somewhat lethargic; per chart he is from ILF at [facility]    Prior Function Prior Level of Function : Needs assist;Driving;Patient  poor historian/Family not available             Mobility Comments: Pt stated he walks with SPC, reports 5 falls in past 6 months (mostly just PTA), walks to dining room and that he drives; need to confirm as he is not fully oriented ADLs Comments: assist for bathing/dressing     Extremity/Trunk Assessment   Upper Extremity Assessment Upper Extremity Assessment: Defer to OT evaluation    Lower Extremity Assessment Lower Extremity Assessment: Generalized weakness    Cervical / Trunk Assessment Cervical / Trunk Assessment: Kyphotic  Communication   Communication Communication: No apparent difficulties    Cognition Arousal: Lethargic Behavior During Therapy: Flat affect   PT - Cognitive impairments: No family/caregiver present to determine baseline, Memory, Orientation   Orientation impairments: Situation, Time                     Following commands: Impaired Following commands impaired: Follows one step commands with increased time     Cueing Cueing Techniques: Verbal cues, Tactile cues     General Comments      Exercises  Long arc quads x 5 both seated AAROM Marching x 10 both AROM, seated   Assessment/Plan    PT Assessment Patient needs continued PT services  PT Problem List Decreased balance;Decreased activity tolerance;Decreased mobility;Pain       PT Treatment Interventions Therapeutic activities;Functional mobility training;Gait training;Balance training;Patient/family education;Therapeutic exercise    PT Goals (Current goals can be found in the Care Plan section)  Acute Rehab PT Goals Patient Stated Goal: none stated PT Goal Formulation: Patient unable to participate in goal setting Time For Goal Achievement: 11/20/23 Potential to Achieve Goals: Fair    Frequency Min 2X/week     Co-evaluation               AM-PAC PT 6 Clicks Mobility  Outcome Measure Help needed turning from your back to your side while in a flat bed without  using bedrails?: A Little Help needed moving from lying on your back to sitting on the side of a flat bed without using bedrails?: A Lot Help needed moving to and from a bed to a chair (including a wheelchair)?: Total Help needed standing up from a chair using your arms (e.g., wheelchair or bedside chair)?: A Lot Help needed to walk in hospital room?: Total Help needed climbing 3-5 steps with a railing? : Total 6 Click Score: 10    End of Session Equipment Utilized During Treatment: Gait belt Activity Tolerance: Patient limited by fatigue Patient left: with call bell/phone within reach;in chair;with chair alarm set Nurse Communication: Mobility status;Need for lift equipment PT Visit Diagnosis: History of falling (Z91.81);Muscle weakness (generalized) (M62.81);Difficulty in walking, not elsewhere classified (R26.2);Unsteadiness on feet (R26.81);Pain    Time: 8891-8868 PT Time Calculation (min) (ACUTE ONLY): 23 min   Charges:   PT Evaluation $PT Eval Moderate Complexity: 1 Mod PT Treatments $Therapeutic Activity: 8-22 mins PT General Charges $$ ACUTE PT VISIT: 1 Visit       Sylvan Delon Copp PT 11/06/2023  Acute Rehabilitation Services  Office [phone]

## 2023-11-06 NOTE — [place] FL2 (Signed)
 [place]  MEDICAID FL2 LEVEL OF CARE FORM     IDENTIFICATION  Patient Name: [name] Birthdate: 04-01-1935 Sex: male Admission Date (Current Location): 11/05/2023  [facility] and [place] Number:  Producer, television/film/video and Address:  [facility],  [address], [place] [address]      Provider Number: [id number]  Attending Physician Name and Address:  [place], Camellia PARAS, DO  Relative Name and Phone Number:       Current Level of Care: Hospital Recommended Level of Care: Skilled Nursing Facility Prior Approval Number:    Date Approved/Denied:   PASRR Number: [id number]  Discharge Plan: SNF    Current Diagnoses: Patient Active Problem List   Diagnosis Date Noted   SIRS (systemic inflammatory response syndrome) (HCC) 11/05/2023   Generalized weakness 11/05/2023   Paroxysmal atrial fibrillation (HCC) 11/05/2023   Insulin  dependent type 2 diabetes mellitus (HCC) 11/05/2023   Hypertension associated with diabetes (HCC) 11/05/2023   History of CVA (cerebrovascular accident) 11/05/2023   MCI (mild cognitive impairment) 11/05/2023   Ischemic cerebrovascular accident (CVA) (HCC) 10/06/2022   Status post surgery 10/02/2022   Stroke: Multiple scattered bilateral ischemic infarcts s/p thrombectomy of left P1/PCA TICI 3 Etiology: Likely cardioembolic 10/02/2022   Hemianopsia 10/01/2022   Right homonymous hemianopsia 10/01/2022   Loop recorder: Biotronic Biomonitor II loop 07/28/2021 07/28/2021   Unsteady gait 08/05/2020   BPH with obstruction/lower urinary tract symptoms 01/21/2020   Depression with anxiety 02/02/2018   Right hip pain 10/02/2017   Recurrent major depression in remission (HCC) 01/27/2017   Chronic kidney disease, stage 3a (HCC) 07/17/2015   History of colonic polyps 03/26/2013   Obesity 10/04/2012   OBSTRUCTIVE SLEEP APNEA 10/30/2008   Hyperlipidemia associated with type 2 diabetes mellitus (HCC) 07/01/2008   HYPERTENSION, BENIGN 07/01/2008    CAD, NATIVE VESSEL 07/01/2008   FATIGUE / MALAISE 07/01/2008   EDEMA 07/01/2008    Orientation RESPIRATION BLADDER Height & Weight     Self, Place  Normal Incontinent Weight: 163 lb 9.3 oz (74.2 kg) Height:  5' 9 (175.3 cm)  BEHAVIORAL SYMPTOMS/MOOD NEUROLOGICAL BOWEL NUTRITION STATUS      Incontinent Diet (see dc summary)  AMBULATORY STATUS COMMUNICATION OF NEEDS Skin   Limited Assist Verbally Normal                       Personal Care Assistance Level of Assistance  Bathing, Dressing, Feeding Bathing Assistance: Limited assistance Feeding assistance: Limited assistance Dressing Assistance: Limited assistance     Functional Limitations Info  Speech, Hearing, Sight Sight Info: Impaired Hearing Info: Adequate Speech Info: Adequate    SPECIAL CARE FACTORS FREQUENCY  PT (By licensed PT), OT (By licensed OT)     PT Frequency: 5x/wk OT Frequency: 5x/wk            Contractures Contractures Info: Not present    Additional Factors Info  Code Status, Allergies Code Status Info: DNR Allergies Info: Ketorolac Tromethamine           Current Medications (11/06/2023):  This is the current hospital active medication list Current Facility-Administered Medications  Medication Dose Route Frequency Provider Last Rate Last Admin   acebutolol  (SECTRAL ) capsule 200 mg  200 mg Oral BID Patel, Vishal R, MD   200 mg at 11/06/23 1209   acetaminophen  (TYLENOL ) tablet 650 mg  650 mg Oral Q6H PRN Patel, Vishal R, MD   650 mg at 11/06/23 0329   Or   acetaminophen  (TYLENOL )  suppository 650 mg  650 mg Rectal Q6H PRN Patel, Vishal R, MD       apixaban  (ELIQUIS ) tablet 5 mg  5 mg Oral BID Patel, Vishal R, MD   5 mg at 11/06/23 9057   atorvastatin  (LIPITOR ) tablet 80 mg  80 mg Oral Daily Patel, Vishal R, MD   80 mg at 11/06/23 9057   cefTRIAXone  (ROCEPHIN ) 1 g in sodium chloride  0.9 % 100 mL IVPB  1 g Intravenous Q24H [place], Eric J, DO       escitalopram  (LEXAPRO ) tablet 10 mg  10 mg  Oral Daily Patel, Vishal R, MD   10 mg at 11/06/23 9057   ezetimibe  (ZETIA ) tablet 10 mg  10 mg Oral Daily Patel, Vishal R, MD   10 mg at 11/06/23 0945   insulin  aspart (novoLOG ) injection 0-5 Units  0-5 Units Subcutaneous QHS Patel, Vishal R, MD       insulin  aspart (novoLOG ) injection 0-9 Units  0-9 Units Subcutaneous TID WC Patel, Vishal R, MD   2 Units at 11/06/23 0950   irbesartan  (AVAPRO ) tablet 150 mg  150 mg Oral Daily Patel, Vishal R, MD   150 mg at 11/06/23 9057   ondansetron  (ZOFRAN ) tablet 4 mg  4 mg Oral Q6H PRN Patel, Vishal R, MD       Or   ondansetron  (ZOFRAN ) injection 4 mg  4 mg Intravenous Q6H PRN Patel, Vishal R, MD       senna-docusate (Senokot-S) tablet 1 tablet  1 tablet Oral QHS PRN Patel, Vishal R, MD       sodium chloride  flush (NS) 0.9 % injection 3 mL  3 mL Intravenous Q12H Patel, Vishal R, MD   3 mL at 11/06/23 0945   tamsulosin  (FLOMAX ) capsule 0.4 mg  0.4 mg Oral QPC supper Patel, Vishal R, MD         Discharge Medications: Please see discharge summary for a list of discharge medications.  Relevant Imaging Results:  Relevant Lab Results:   Additional Information SSN [id number]  Sheri ONEIDA Sharps, LCSW

## 2023-11-06 NOTE — TOC Initial Note (Signed)
 Transition of Care [facility]) - Initial/Assessment Note    Patient Details  Name: [name] MRN: [id number] Date of Birth: 10-29-1934  [facility] CM/SW Contact:    Mike ONEIDA Sharps, LCSW Phone Number: 11/06/2023, 12:54 PM  Clinical Narrative:                 Pt from [facility] ILF. Pt recommended for SNF. PASRR obtained and FL2 completed. SNF referral faxed and awaiting bed offers.   Expected Discharge Plan: Skilled Nursing Facility Barriers to Discharge: Continued Medical Work up   Patient Goals and CMS Choice Patient states their goals for this hospitalization and ongoing recovery are:: return to [facility] ILF following STR CMS Medicare.gov Compare Post Acute Care list provided to:: Patient Choice offered to / list presented to : Patient [name] ownership interest in [facility].provided to:: Patient    Expected Discharge Plan and Services In-house Referral: NA Discharge Planning Services: NA Post Acute Care Choice: Skilled Nursing Facility Living arrangements for the past 2 months: Independent Living Facility                 DME Arranged: N/A DME Agency: NA       HH Arranged: NA HH Agency: NA        Prior Living Arrangements/Services Living arrangements for the past 2 months: Independent Living Facility Lives with:: Self Patient language and need for interpreter reviewed:: Yes Do you feel safe going back to the place where you live?: Yes      Need for Family Participation in Patient Care: Yes (Comment) Care giver support system in place?: Yes (comment)   Criminal Activity/Legal Involvement Pertinent to Current Situation/Hospitalization: No - Comment as needed  Activities of Daily Living      Permission Sought/Granted                  Emotional Assessment Appearance:: Appears stated age Attitude/Demeanor/Rapport: Engaged Affect (typically observed): Accepting Orientation: : Oriented to Place, Oriented to  Self Alcohol  / Substance Use: Not Applicable Psych Involvement: No (comment)  Admission diagnosis:  SIRS (systemic inflammatory response syndrome) (HCC) [R65.10] Patient Active Problem List   Diagnosis Date Noted   SIRS (systemic inflammatory response syndrome) (HCC) 11/05/2023   Generalized weakness 11/05/2023   Paroxysmal atrial fibrillation (HCC) 11/05/2023   Insulin  dependent type 2 diabetes mellitus (HCC) 11/05/2023   Hypertension associated with diabetes (HCC) 11/05/2023   History of CVA (cerebrovascular accident) 11/05/2023   MCI (mild cognitive impairment) 11/05/2023   Ischemic cerebrovascular accident (CVA) (HCC) 10/06/2022   Status post surgery 10/02/2022   Stroke: Multiple scattered bilateral ischemic infarcts s/p thrombectomy of left P1/PCA TICI 3 Etiology: Likely cardioembolic 10/02/2022   Hemianopsia 10/01/2022   Right homonymous hemianopsia 10/01/2022   Loop recorder: Biotronic Biomonitor II loop 07/28/2021 07/28/2021   Unsteady gait 08/05/2020   BPH with obstruction/lower urinary tract symptoms 01/21/2020   Depression with anxiety 02/02/2018   Right hip pain 10/02/2017   Recurrent major depression in remission (HCC) 01/27/2017   Chronic kidney disease, stage 3a (HCC) 07/17/2015   History of colonic polyps 03/26/2013   Obesity 10/04/2012   OBSTRUCTIVE SLEEP APNEA 10/30/2008   Hyperlipidemia associated with type 2 diabetes mellitus (HCC) 07/01/2008   HYPERTENSION, BENIGN 07/01/2008   CAD, NATIVE VESSEL 07/01/2008   FATIGUE / MALAISE 07/01/2008   EDEMA 07/01/2008   PCP:  Mike Elsie JONETTA Mickey., MD Pharmacy:   [facility] [address], [place] - [address] [place] [address] Phone: [phone] Fax: [phone]  [name] [address] [place] [address] Phone: [phone] Fax: [phone]  [facility] [address] [place]  [place] [address] Phone: [phone] Fax: [phone]     Social Drivers of Health (SDOH) Social History: SDOH Screenings   Depression [id number]): Medium Risk (03/20/2023)  Tobacco Use: Low Risk  (08/01/2023)   SDOH Interventions:     Readmission Risk Interventions     No data to display

## 2023-11-06 NOTE — Evaluation (Addendum)
 Occupational Therapy Evaluation Patient Details Name: [name] MRN: [id number] DOB: March 30, 1935 Today's Date: 11/06/2023   History of Present Illness   [name] is a 89 y.o. male with generalized weakness. Admitted with dx of SIRS, Acute cystitis without hematuria PMH: hyperlipidemia, type 2 diabetes, paroxysmal A-fib on Eliquis      Clinical Impressions Per chart review, PTA pt living in ILF at [facility]. Pt unable to give accurate functional level due to decreased level of arousal. Currently requires max A with bed mobility and total A with LB ADL due to below listed deficits. . Patient will benefit from continued inpatient follow up therapy, <3 hours/day to maximize functional level of independence with goal to return to ILF. Acute OT to follow.      If plan is discharge home, recommend the following:   Two people to help with walking and/or transfers;Two people to help with bathing/dressing/bathroom     Functional Status Assessment   Patient has had a recent decline in their functional status and demonstrates the ability to make significant improvements in function in a reasonable and predictable amount of time.     Equipment Recommendations   None recommended by OT     Recommendations for Other Services         Precautions/Restrictions   Precautions Precautions: Fall Recall of Precautions/Restrictions: Impaired Restrictions Weight Bearing Restrictions Per Provider Order: No     Mobility Bed Mobility Overal bed mobility: Needs Assistance Bed Mobility: Supine to Sit     Supine to sit: Max assist     General bed mobility comments: attempts ot move to EOB wtih bed pad; posterior lean noted    Transfers                     Transfer via Lift Equipment: Stedy (needed per PT; not attmepted with OT due to lethargy)    Balance Overall balance assessment: Needs assistance Sitting-balance support: Feet supported, Single  extremity supported Sitting balance-Leahy Scale: Poor                                     ADL either performed or assessed with clinical judgement   ADL Overall ADL's : Needs assistance/impaired Eating/Feeding: Supervision/ safety;Set up Eating/Feeding Details (indicate cue type and reason): pt asleep with food in his mouth; food removed - nsg notified Grooming: Moderate assistance   Upper Body Bathing: Maximal assistance;Bed level                           Functional mobility during ADLs: Maximal assistance General ADL Comments: total A with all other ADL tasks due to Level of arousal     Vision   Additional Comments: eyes closed majority of session; previous CVA - unsure if vision affected     Perception         Praxis         Pertinent Vitals/Pain Pain Assessment Pain Assessment: Faces Faces Pain Scale: Hurts little more Pain Location: R side with movement Pain Descriptors / Indicators: Grimacing Pain Intervention(s): Limited activity within patient's tolerance     Extremity/Trunk Assessment Upper Extremity Assessment Upper Extremity Assessment: Generalized weakness;Right hand dominant;RUE deficits/detail (R shoulder pain with attempts at overhead reaching)   Lower Extremity Assessment Lower Extremity Assessment: Defer to PT evaluation   Cervical / Trunk Assessment Cervical / Trunk Assessment: Kyphotic  Communication Communication Communication: No apparent difficulties   Cognition Arousal: Lethargic Behavior During Therapy: Flat affect Cognition: Cognition impaired, Difficult to assess Difficult to assess due to: Level of arousal Orientation impairments: Time, Situation, Place Awareness: Intellectual awareness impaired, Online awareness impaired   Attention impairment (select first level of impairment): Focused attention                     Following commands: Impaired Following commands impaired: Follows one step  commands with increased time     Cueing  General Comments   Cueing Techniques: Verbal cues;Tactile cues      Exercises     Shoulder Instructions      Home Living Family/patient expects to be discharged to:: Assisted living (ILF at [facility]) Living Arrangements: Alone   Type of Home: Independent living facility Home Access: Level entry     Home Layout: One level               Home Equipment: Cane - single point;Educational psychologist (4 wheels)          Prior Functioning/Environment Prior Level of Function : Needs assist;Driving;Patient poor historian/Family not available             Mobility Comments: Gave OT different information - states he uses a rollator but could bath and dress himself      OT Problem List: Decreased strength;Decreased range of motion;Decreased activity tolerance;Impaired balance (sitting and/or standing);Decreased coordination;Decreased cognition;Decreased safety awareness;Decreased knowledge of use of DME or AE   OT Treatment/Interventions: Self-care/ADL training;Therapeutic exercise;Energy conservation;DME and/or AE instruction;Therapeutic activities;Patient/family education;Balance training      OT Goals(Current goals can be found in the care plan section)   Acute Rehab OT Goals Patient Stated Goal: go home OT Goal Formulation: Patient unable to participate in goal setting Time For Goal Achievement: 11/20/23 Potential to Achieve Goals: Good   OT Frequency:  Min 2X/week    Co-evaluation              AM-PAC OT 6 Clicks Daily Activity     Outcome Measure Help from another person eating meals?: A Little Help from another person taking care of personal grooming?: A Little Help from another person toileting, which includes using toliet, bedpan, or urinal?: Total Help from another person bathing (including washing, rinsing, drying)?: Total Help from another person to put on and taking off regular upper body  clothing?: A Lot Help from another person to put on and taking off regular lower body clothing?: Total 6 Click Score: 11   End of Session Nurse Communication: Mobility status (food in mouth/asleep)  Activity Tolerance: Patient limited by lethargy Patient left: in bed;with call bell/phone within reach;with bed alarm set  OT Visit Diagnosis: Unsteadiness on feet (R26.81);Other abnormalities of gait and mobility (R26.89);Muscle weakness (generalized) (M62.81);Other symptoms and signs involving cognitive function                Time: 1450-1510 OT Time Calculation (min): 20 min Charges:  OT General Charges $OT Visit: 1 Visit OT Evaluation $OT Eval Moderate Complexity: 1 Mod  .hil  [name],HILLARY 11/06/2023, 3:16 PM

## 2023-11-07 DIAGNOSIS — R6510 Systemic inflammatory response syndrome (SIRS) of non-infectious origin without acute organ dysfunction: Secondary | ICD-10-CM | POA: Diagnosis not present

## 2023-11-07 LAB — CBC
HCT: 41.5 % (ref 39.0–52.0)
Hemoglobin: 14.6 g/dL (ref 13.0–17.0)
MCH: 33.3 pg (ref 26.0–34.0)
MCHC: 35.2 g/dL (ref 30.0–36.0)
MCV: 94.7 fL (ref 80.0–100.0)
Platelets: 152 K/uL (ref 150–400)
RBC: 4.38 MIL/uL (ref 4.22–5.81)
RDW: 13.8 % (ref 11.5–15.5)
WBC: 22 K/uL — ABNORMAL HIGH (ref 4.0–10.5)
nRBC: 0 % (ref 0.0–0.2)

## 2023-11-07 LAB — BASIC METABOLIC PANEL WITH GFR
Anion gap: 12 (ref 5–15)
BUN: 28 mg/dL — ABNORMAL HIGH (ref 8–23)
CO2: 23 mmol/L (ref 22–32)
Calcium: 9.2 mg/dL (ref 8.9–10.3)
Chloride: 99 mmol/L (ref 98–111)
Creatinine, Ser: 1.48 mg/dL — ABNORMAL HIGH (ref 0.61–1.24)
GFR, Estimated: 46 mL/min — ABNORMAL LOW (ref >60)
Glucose, Bld: 125 mg/dL — ABNORMAL HIGH (ref 70–99)
Potassium: 4.6 mmol/L (ref 3.5–5.1)
Sodium: 134 mmol/L — ABNORMAL LOW (ref 135–145)

## 2023-11-07 LAB — GLUCOSE, CAPILLARY
Glucose-Capillary: 154 mg/dL — ABNORMAL HIGH (ref 70–99)
Glucose-Capillary: 313 mg/dL — ABNORMAL HIGH (ref 70–99)
Glucose-Capillary: 175 mg/dL — ABNORMAL HIGH (ref 70–99)
Glucose-Capillary: 193 mg/dL — ABNORMAL HIGH (ref 70–99)

## 2023-11-07 LAB — MAGNESIUM: Magnesium: 1.8 mg/dL (ref 1.7–2.4)

## 2023-11-07 LAB — PROCALCITONIN: Procalcitonin: 1.79 ng/mL

## 2023-11-07 MED ORDER — SODIUM CHLORIDE 0.9 % IV SOLN: INTRAVENOUS | Status: AC

## 2023-11-07 NOTE — Plan of Care (Signed)
   Problem: Nutrition: Goal: Adequate nutrition will be maintained Outcome: Progressing   Problem: Skin Integrity: Goal: Risk for impaired skin integrity will decrease Outcome: Progressing   Problem: Safety: Goal: Ability to remain free from injury will improve Outcome: Progressing

## 2023-11-07 NOTE — TOC Progression Note (Signed)
 Transition of Care [facility]) - Progression Note    Patient Details  Name: [name] MRN: [id number] Date of Birth: 01/19/1935  Transition of [facility]) CM/SW Contact  Heather DELENA Saltness, LCSW Phone Number: 11/07/2023, 1:13 PM  Clinical Narrative:    CSW spoke with pt's daughter, Hendricks Elbe [phone], via phone call to discuss SNF bed offers and get bed choice. CSW provided daughter with list of facilities, including name of facility, location, and Medicare Star-Rating. Pt's daughter reports she will review facilities with family and call CSW back this afternoon with decision.  [facility] [address] [place], [place] [address] [phone] Overall rating ??? Above average  [facility] [address] [place], [place] [address] [phone] Overall rating ??? Much above average  [facility] [address] [place], [place] [address] [phone] Overall rating ????  [facility] [address] [place], [place] [address] [phone] Overall rating ????  [facility] [address] [place], [place] [address] [phone] Overall rating ? Much above average  [facility] [address] [place], [place] [address] [phone] Overall rating ? Average  [facility] [address] [place], [place] [address] [phone] Overall rating ? Much below average  [facility] [address] [place], [place] [address] [phone] Overall rating ?? Below average  [facility] [address] [place], [place] [address] [phone] Overall rating? Below average  [facility] [address] [place], [place] [address] [phone] Overall rating ? Much  below average   Expected Discharge Plan: Skilled Nursing Facility Barriers to Discharge: Continued Medical Work up  Expected Discharge Plan and Services In-house Referral: NA Discharge Planning Services: NA Post Acute Care Choice: Skilled Nursing Facility Living arrangements for the past 2 months: Independent Living Facility                 DME Arranged: N/A DME Agency: NA       HH Arranged: NA HH Agency: NA         Social Determinants of Health (SDOH) Interventions SDOH Screenings   Depression (PHQ2-9): Medium Risk (03/20/2023)  Tobacco Use: Low Risk  (08/01/2023)    Readmission Risk Interventions     No data to display          Heather Saltness, MSW, LCSW 11/07/2023 2:07 PM

## 2023-11-07 NOTE — Plan of Care (Signed)
   Problem: Nutrition: Goal: Adequate nutrition will be maintained Outcome: Progressing   Problem: Safety: Goal: Ability to remain free from injury will improve Outcome: Progressing   Problem: Skin Integrity: Goal: Risk for impaired skin integrity will decrease Outcome: Progressing

## 2023-11-07 NOTE — Progress Notes (Addendum)
 PROGRESS NOTE    [name]  FMW:[id number] DOB: [birth year] DOA: 11/05/2023 PCP: [name]., MD    Brief Narrative:   [name] is a 89 y.o. male with past medical history significant for CAD s/p CABG, PAF on Eliquis , history of CVA, CKD stage IIIa, T2DM, HTN, HLD, BPH, depression/anxiety, MCI who presented to the ED from independent living for evaluation of generalized weakness. Patient states for the last 2 weeks he has been feeling generally weak, mostly feels weak in both legs.  He has had some falls without injury.  He has not hit his head or lost consciousness.  He says he also feels dizzy at times.  This can occur at rest or with activity but more noticeable when he changes positions.  He has not had any chest pain, dyspnea, cough, nausea, vomit, abdominal pain, diarrhea, dysuria.  He says he saw some swelling in his legs earlier for which he takes Lasix  as needed.  He reports good urine output without dysuria.  He says he normally ambulates with use of a walker.   In the ED, BP 154/86, pulse 94, RR 18, temp 98.7 F, SpO2 95% on room air.  Tmax 101.2 F rectally. WBC 21.1, hemoglobin 14.5, platelets 198, sodium 132, potassium 3.8, bicarb 26, BUN 20, creatinine 1.29, serum glucose 261, LFTs within normal limits, lactic acid 1.4. UA showed >500 glucose, negative ketones, 30 protein, negative nitrates or leukocytes, 0-5 RBCs and WBCs, no bacteria.  Blood and urine cultures in process.  SARS-CoV-2, influenza, RSV PCR negative. CT head without contrast negative for acute intracranial abnormality.  Atrophy with stable ventriculomegaly and small vessel ischemic changes noted. CT chest/abdomen/pelvis with contrast negative for acute findings in the chest.  Thick-walled bladder with mild perivesical stranding noted, possibly reflecting sequela of chronic bladder outlet obstruction or superimposed cystitis.  Prostatomegaly noted. Patient was given IV ceftriaxone .  The hospitalist  service was consulted to admit.  Assessment & Plan:   Sepsis secondary to GNR UTI Patient presenting with leukocytosis and fever.  No obvious infectious process.  COVID, influenza, RSV panel negative.  CT A/P shows thickened wall bladder with mild perivesicular stranding sequelae of chronic bladder outlet obstruction versus superimposed cystitis, prostatomegaly. -- WBC 21.1>21.1>22.0 -- Urine culture: 50K GNR, further identification/susceptibilities pending -- Blood culture: No growth x 2 days -- Ceftriaxone  1 g IV every 24 hours -- Monitor fever curve, supportive care -- Given persistent leukocytosis, will add on peripheral smear and differential for tomorrow morning -- CBC daily  Generalized weakness: Generally weak, feels more so in both legs.  No focal deficits.  Has had falls without injury or syncope.  Currently residing at [facility]. -- PT/OT eval: Recommend SNF placement -- TOC consulted -- Fall precautions  Hypokalemia Repleted, repeat potassium this a.m. 4.6  Hyponatremia Etiology likely secondary to hypovolemic hyponatremia in setting of poor oral intake in days preceding hospitalization. -- Na [phone] -- Continue to encourage increased oral intake -- IV fluid hydration today  -- BMP in am   Paroxysmal atrial fibrillation: In sinus rhythm with first-degree AV block on admission.   -- Acebutolol  200 mg PO BID -- Eliquis  5 mg PO BID   Insulin -dependent type 2 diabetes: Holding metformin and Jardiance  -- Sensitive SSI for coverage. -- CBG before every meal/at bedtime  CKD stage IIIa: -- Cr 1.29>1.31>1.48 -- IV fluid hydration today -- BMP in a.m.   Hypertension: -- Irbesartan  150 mg p.o. daily (substituted for home olmesartan )  History of CVA: Continue Eliquis , atorvastatin , Zetia .   CAD s/p CABG: Stable, denies chest pain.  Continue Eliquis  -- atorvastatin  80 mg PO daily -- Zetia  10 mg PO daily.   Hyperlipidemia: Continue atorvastatin   and Zetia .   Depression/anxiety: Continue Lexapro  10 mg PO daily   BPH: Continue Flomax  0.4 mg PO daily.   Mild cognitive impairment: -- Delirium precautions -- Get up during the day -- Encourage a familiar face to remain present throughout the day -- Keep blinds open and lights on during daylight hours -- Minimize the use of opioids/benzodiazepines   DVT prophylaxis:  apixaban  (ELIQUIS ) tablet 5 mg    Code Status: Do not attempt resuscitation (DNR) PRE-ARREST INTERVENTIONS DESIRED Family Communication: Updated son [name] this morning  Disposition Plan:  Level of care: Telemetry Status is: Inpatient Remains inpatient appropriate because: IV antibiotics, will need SNF placement    Consultants:  None  Procedures:  None  Antimicrobials:  Ceftriaxone  7/20>>   Subjective: Patient seen examined Fernandez, lying in bed.  Slightly confused.  Son [name] present at Fernandez this morning.  No specific complaints this morning.  Remains afebrile, urine culture now positive for GNR's awaiting further identification and susceptibilities.  Updated family regarding therapy recommendations for SNF placement.  Denies headache, no chest pain, no shortness of breath, no abdominal pain, no fever/chills/night sweats, no nausea/vomiting/diarrhea.  No acute events overnight per nursing staff.  Objective: Vitals:   11/06/23 1209 11/06/23 1943 11/06/23 2100 11/07/23 0607  BP: 93/62 116/71 (!) 115/92 139/79  Pulse: 84 72 88 69  Resp: 16 18  20   Temp: 98 F (36.7 C) 97.8 F (36.6 C)  97.6 F (36.4 C)  TempSrc: Oral Oral    SpO2: 97% 94% 95% 98%  Weight:      Height:        Intake/Output Summary (Last 24 hours) at 11/07/2023 1128 Last data filed at 11/07/2023 0931 Gross per 24 hour  Intake 393 ml  Output 650 ml  Net -257 ml   Filed Weights   11/05/23 1826 11/06/23 0444  Weight: 77.1 kg 74.2 kg    Examination:  Physical Exam: GEN: NAD, alert and oriented to self/place [name]), president ([name]), correct month (July) but not year (1986) HEENT: NCAT, PERRL, EOMI, sclera clear, MMM PULM: CTAB w/o wheezes/crackles, normal respiratory effort, on room air CV: RRR w/o M/G/R GI: abd soft, NTND, + BS MSK: no peripheral edema, moves all extremities independently NEURO: No focal neurological deficit PSYCH: normal mood/affect Integumentary: No concerning rashes/lesions/wounds noted on exposed skin surfaces    Data Reviewed: I have personally reviewed following labs and imaging studies  CBC: Recent Labs  Lab 11/05/23 1844 11/06/23 0527 11/07/23 0552  WBC 21.1* 21.1* 22.0*  HGB 14.5 14.8 14.6  HCT 43.2 43.3 41.5  MCV 97.1 96.4 94.7  PLT 198 171 152   Basic Metabolic Panel: Recent Labs  Lab 11/05/23 1844 11/06/23 0527 11/07/23 0552  NA 132* 133* 134*  K 3.8 3.4* 4.6  CL 95* 97* 99  CO2 26 26 23   GLUCOSE 261* 276* 125*  BUN 20 21 28*  CREATININE 1.29* 1.31* 1.48*  CALCIUM  9.3 9.3 9.2  MG  --   --  1.8   GFR: Estimated Creatinine Clearance: 35.2 mL/min (A) (by C-G formula based on SCr of 1.48 mg/dL (H)). Liver Function Tests: Recent Labs  Lab 11/05/23 1844  AST 34  ALT 26  ALKPHOS 94  BILITOT 1.0  PROT 6.9  ALBUMIN  3.7   No results for input(s): LIPASE, AMYLASE in the last 168 hours. No results for input(s): AMMONIA in the last 168 hours. Coagulation Profile: No results for input(s): INR, PROTIME in the last 168 hours. Cardiac Enzymes: No results for input(s): CKTOTAL, CKMB, CKMBINDEX, TROPONINI in the last 168 hours. BNP (last 3 results) No results for input(s): PROBNP in the last 8760 hours. HbA1C: No results for input(s): HGBA1C in the last 72 hours. CBG: Recent Labs  Lab 11/06/23 0833 11/06/23 1223 11/06/23 1741 11/06/23 2058 11/07/23 0736  GLUCAP 183* 184* 208* 155* 154*   Lipid Profile: No results for input(s): CHOL, HDL, LDLCALC, TRIG, CHOLHDL, LDLDIRECT in the last 72  hours. Thyroid  Function Tests: No results for input(s): TSH, T4TOTAL, FREET4, T3FREE, THYROIDAB in the last 72 hours. Anemia Panel: No results for input(s): VITAMINB12, FOLATE, FERRITIN, TIBC, IRON, RETICCTPCT in the last 72 hours. Sepsis Labs: Recent Labs  Lab 11/05/23 2008 11/05/23 2150 11/06/23 0527 11/07/23 0646  PROCALCITON  --   --  0.15 1.79  LATICACIDVEN 1.4 1.0  --   --     Recent Results (from the past 240 hours)  Blood culture (routine x 2)     Status: None (Preliminary result)   Collection Time: 11/05/23  7:55 PM   Specimen: BLOOD  Result Value Ref Range Status   Specimen Description   Final    BLOOD BLOOD LEFT ARM Performed at [facility], [address] [place], [place] [address]    Special Requests   Final    BOTTLES DRAWN AEROBIC AND ANAEROBIC Blood Culture adequate volume Performed at [facility], [address], [place], [place] [address]    Culture   Final    NO GROWTH 2 DAYS Performed at [facility], [address] [place], [place] [address]    Report Status PENDING  Incomplete  Resp panel by RT-PCR (RSV, Flu A&B, Covid) Anterior Nasal Swab     Status: None   Collection Time: 11/05/23  8:16 PM   Specimen: Anterior Nasal Swab  Result Value Ref Range Status   SARS Coronavirus 2 by RT PCR NEGATIVE NEGATIVE Final    Comment: (NOTE) SARS-CoV-2 target nucleic acids are NOT DETECTED.  The SARS-CoV-2 RNA is generally detectable in upper respiratory specimens during the acute phase of infection. The lowest concentration of SARS-CoV-2 viral copies this assay can detect is 138 copies/mL. A negative result does not preclude SARS-Cov-2 infection and should not be used as the sole basis for treatment or other patient management decisions. A negative result may occur with  improper specimen collection/handling, submission of specimen other than nasopharyngeal swab, presence of viral  mutation(s) within the areas targeted by this assay, and inadequate number of viral copies(<138 copies/mL). A negative result must be combined with clinical observations, patient history, and epidemiological information. The expected result is Negative.  Fact Sheet for Patients:  BloggerCourse.com  Fact Sheet for Healthcare Providers:  SeriousBroker.it  This test is no t yet approved or cleared by the [place]  FDA and  has been authorized for detection and/or diagnosis of SARS-CoV-2 by FDA under an Emergency Use Authorization (EUA). This EUA will remain  in effect (meaning this test can be used) for the duration of the COVID-19 declaration under Section 564(b)(1) of the Act, 21 U.S.C.section 360bbb-3(b)(1), unless the authorization is terminated  or revoked sooner.       Influenza A by PCR NEGATIVE NEGATIVE Final   Influenza B by  PCR NEGATIVE NEGATIVE Final    Comment: (NOTE) The Xpert Xpress SARS-CoV-2/FLU/RSV plus assay is intended as an aid in the diagnosis of influenza from Nasopharyngeal swab specimens and should not be used as a sole basis for treatment. Nasal washings and aspirates are unacceptable for Xpert Xpress SARS-CoV-2/FLU/RSV testing.  Fact Sheet for Patients: BloggerCourse.com  Fact Sheet for Healthcare Providers: SeriousBroker.it  This test is not yet approved or cleared by the [place]  FDA and has been authorized for detection and/or diagnosis of SARS-CoV-2 by FDA under an Emergency Use Authorization (EUA). This EUA will remain in effect (meaning this test can be used) for the duration of the COVID-19 declaration under Section 564(b)(1) of the Act, 21 U.S.C. section 360bbb-3(b)(1), unless the authorization is terminated or revoked.     Resp Syncytial Virus by PCR NEGATIVE NEGATIVE Final    Comment: (NOTE) Fact Sheet for  Patients: BloggerCourse.com  Fact Sheet for Healthcare Providers: SeriousBroker.it  This test is not yet approved or cleared by the [place]  FDA and has been authorized for detection and/or diagnosis of SARS-CoV-2 by FDA under an Emergency Use Authorization (EUA). This EUA will remain in effect (meaning this test can be used) for the duration of the COVID-19 declaration under Section 564(b)(1) of the Act, 21 U.S.C. section 360bbb-3(b)(1), unless the authorization is terminated or revoked.  Performed at [facility], [address], [place], [place] [address]   Urine Culture     Status: Abnormal (Preliminary result)   Collection Time: 11/05/23  9:06 PM   Specimen: Urine, Clean Catch  Result Value Ref Range Status   Specimen Description   Final    URINE, CLEAN CATCH Performed at [facility], [address], [place], [place] [address]    Special Requests   Final    NONE Performed at [facility], [address], [place], [place] [address]    Culture 50,000 COLONIES/mL GRAM NEGATIVE RODS (A)  Final   Report Status PENDING  Incomplete  Blood culture (routine x 2)     Status: None (Preliminary result)   Collection Time: 11/05/23  9:50 PM   Specimen: BLOOD  Result Value Ref Range Status   Specimen Description   Final    BLOOD RIGHT ANTECUBITAL Performed at [facility], [address], [place], [place] [address]    Special Requests   Final    BOTTLES DRAWN AEROBIC AND ANAEROBIC Blood Culture adequate volume Performed at [facility], [address], [place], [place] [address]    Culture   Final    NO GROWTH 1 DAY Performed at [facility], [address], [place], [place] [address]    Report Status PENDING  Incomplete         Radiology Studies: CT CHEST ABDOMEN PELVIS W CONTRAST Result Date: 11/05/2023 EXAM: CT  CHEST, ABDOMEN AND PELVIS WITH CONTRAST 11/05/2023 09:06:21 PM TECHNIQUE: CT of the chest, abdomen and pelvis was performed with the administration of intravenous contrast. Multiplanar reformatted images are provided for review. Automated exposure control, iterative reconstruction, and/or weight based adjustment of the mA/kV was utilized to reduce the radiation dose to as low as reasonably achievable. COMPARISON: CT abdomen and pelvis dated 09/09/2023 and CTA chest dated 12/29/2021. CLINICAL HISTORY: Sepsis. Table formatting from the original note was not included. Images from the original note were not included. Pt bib EMS for onset of weakness through the day. Started the day using walker and was  at baseline. This afternoon he was unable to get up and use walker. Took PRN lasix  this morning not normal occurrence. Pt had swelling in legs. Missed eliquis  for 2 nights, BP meds, and enlarged prostate. CBG 183. FINDINGS: CHEST: MEDIASTINUM: Mild cardiomegaly. Postsurgical changes related to prior CABG. Median sternotomy. THORACIC LYMPH NODES: No mediastinal, hilar or axillary lymphadenopathy. LUNGS AND PLEURA: Mild dependent atelectasis in the posterior right middle lobe, lingula, and bilateral lower lobes. No focal consolidation or pulmonary edema. No pleural effusion or pneumothorax. ABDOMEN AND PELVIS: LIVER: The liver is unremarkable. GALLBLADDER AND BILE DUCTS: Layering small gallstones (image 57), without associated inflammatory changes. No biliary ductal dilatation. SPLEEN: No acute abnormality. PANCREAS: No acute abnormality. ADRENAL GLANDS: No acute abnormality. KIDNEYS, URETERS AND BLADDER: 3.0 cm simple right upper pole renal cyst (image 54), benign (Bosniak 1). Per consensus, no follow-up is needed for simple Bosniak type 1 and 2 renal cysts, unless the patient has a malignancy history or risk factors. No stones in the kidneys or ureters. No hydronephrosis. No perinephric or periureteral stranding. Thick  walled bladder with mild perivesical stranding (image 102), possibly reflecting sequela of chronic bladder outlet obstruction, although superimposed cystitis as possible. GI AND BOWEL: Stomach demonstrates no acute abnormality. There is no bowel obstruction. Appendix is not discretely visualized. REPRODUCTIVE ORGANS: Prostatomegaly, with enlargement of the central gland indenting the base of the bladder, suggesting BPH. PERITONEUM AND RETROPERITONEUM: No ascites. No free air. VASCULATURE: Atherosclerotic calcifications of the abdominal aorta and branch vessels, although patent. Thoracic aortic atherosclerosis. ABDOMINAL AND PELVIS LYMPH NODES: No lymphadenopathy. BONES AND SOFT TISSUES: Healing right posterior tenth and 11th rib fractures. Healed right transverse process fractures at L1-3. Degenerative changes of the visualized thoracolumbar spine. IMPRESSION: 1. No acute findings in the chest. 2. Thick-walled bladder with mild perivesical stranding, possibly reflecting sequela of chronic bladder outlet obstruction or superimposed cystitis. 3. Prostatomegaly. Electronically signed by: Pinkie Pebbles MD 11/05/2023 09:19 PM EDT RP Workstation: [id number]   CT Head Wo Contrast Result Date: 11/05/2023 EXAM: CT HEAD WITHOUT CONTRAST 11/05/2023 09:06:21 PM TECHNIQUE: CT of the head was performed without the administration of intravenous contrast. Automated exposure control, iterative reconstruction, and/or weight based adjustment of the mA/kV was utilized to reduce the radiation dose to as low as reasonably achievable. COMPARISON: 09/09/2023 CLINICAL HISTORY: Mental status change, unknown cause. Pt bib EMS for onset of weakness through the day. Started the day using walker and was at baseline. This afternoon he was unable to get up and use walker. Took PRN lasix  this morning not normal occurrence. Pt had swelling in legs. Missed eliquis  for 2 nights, BP meds, and enlarged prostate. CBG 183. FINDINGS: BRAIN AND  VENTRICLES: No acute hemorrhage. Gray-white differentiation is preserved. No extra-axial collection. No mass effect or midline shift. Global cortical and central atrophy. Stable ventriculomegaly. Mild subcortical and periventricular small vessel ischemic changes. Mild intracranial atherosclerosis. ORBITS: No acute abnormality. SINUSES: Mild mucosal thickening of the bilateral maxillary sinuses. SOFT TISSUES AND SKULL: No acute soft tissue abnormality. No skull fracture. IMPRESSION: 1. No acute intracranial abnormality. 2. Atrophy with stable ventriculomegaly. Small vessel ischemic changes. Electronically signed by: Pinkie Pebbles MD 11/05/2023 09:13 PM EDT RP Workstation: [id number]   DG Chest Port 1 View Result Date: 11/05/2023 CLINICAL DATA:  Weakness. EXAM: PORTABLE CHEST 1 VIEW COMPARISON:  November 29, 2022 FINDINGS: Multiple sternal wires and vascular clips are present. The heart size and mediastinal contours are within normal limits. A radiopaque loop recorder device is in place. Low  lung volumes are noted. Mild linear scarring and/or atelectasis is seen within the left lung base. The visualized skeletal structures are unremarkable. IMPRESSION: 1. Evidence of prior median sternotomy/CABG. 2. Mild left basilar linear scarring and/or atelectasis. Electronically Signed   By: Suzen Dials M.D.   On: 11/05/2023 19:56        Scheduled Meds:  acebutolol   200 mg Oral BID   apixaban   5 mg Oral BID   atorvastatin   80 mg Oral Daily   escitalopram   10 mg Oral Daily   ezetimibe   10 mg Oral Daily   insulin  aspart  0-5 Units Subcutaneous QHS   insulin  aspart  0-9 Units Subcutaneous TID WC   irbesartan   150 mg Oral Daily   sodium chloride  flush  3 mL Intravenous Q12H   tamsulosin   0.4 mg Oral QPC supper   Continuous Infusions:  sodium chloride  100 mL/hr at 11/07/23 0759   cefTRIAXone  (ROCEPHIN )  IV 1 g (11/06/23 2137)     LOS: 1 day    Time spent: 52 minutes spent on 11/07/2023 caring for  this patient face-to-face including chart review, ordering labs/tests, documenting, discussion with nursing staff, consultants, updating family and interview/physical exam    Camellia PARAS [place], DO [facility] Available via Epic secure chat 7am-7pm After these hours, please refer to coverage provider listed on [email or web address] 11/07/2023, 11:28 AM

## 2023-11-08 ENCOUNTER — Other Ambulatory Visit (HOSPITAL_COMMUNITY): Payer: Self-pay

## 2023-11-08 DIAGNOSIS — R6510 Systemic inflammatory response syndrome (SIRS) of non-infectious origin without acute organ dysfunction: Secondary | ICD-10-CM | POA: Diagnosis not present

## 2023-11-08 LAB — CBC
HCT: 40 % (ref 39.0–52.0)
Hemoglobin: 13.1 g/dL (ref 13.0–17.0)
MCH: 32.1 pg (ref 26.0–34.0)
MCHC: 32.8 g/dL (ref 30.0–36.0)
MCV: 98 fL (ref 80.0–100.0)
Platelets: 157 K/uL (ref 150–400)
RBC: 4.08 MIL/uL — ABNORMAL LOW (ref 4.22–5.81)
RDW: 13.5 % (ref 11.5–15.5)
WBC: 12.9 K/uL — ABNORMAL HIGH (ref 4.0–10.5)
nRBC: 0 % (ref 0.0–0.2)

## 2023-11-08 LAB — BASIC METABOLIC PANEL WITH GFR
Anion gap: 10 (ref 5–15)
BUN: 25 mg/dL — ABNORMAL HIGH (ref 8–23)
CO2: 25 mmol/L (ref 22–32)
Calcium: 8.7 mg/dL — ABNORMAL LOW (ref 8.9–10.3)
Chloride: 101 mmol/L (ref 98–111)
Creatinine, Ser: 1.16 mg/dL (ref 0.61–1.24)
GFR, Estimated: >60 mL/min (ref >60)
Glucose, Bld: 124 mg/dL — ABNORMAL HIGH (ref 70–99)
Potassium: 3.9 mmol/L (ref 3.5–5.1)
Sodium: 136 mmol/L (ref 135–145)

## 2023-11-08 LAB — GLUCOSE, CAPILLARY
Glucose-Capillary: 130 mg/dL — ABNORMAL HIGH (ref 70–99)
Glucose-Capillary: 202 mg/dL — ABNORMAL HIGH (ref 70–99)

## 2023-11-08 LAB — DIFFERENTIAL
Abs Immature Granulocytes: 0.12 K/uL — ABNORMAL HIGH (ref 0.00–0.07)
Basophils Absolute: 0 K/uL (ref 0.0–0.1)
Basophils Relative: 0 %
Eosinophils Absolute: 0.2 K/uL (ref 0.0–0.5)
Eosinophils Relative: 1 %
Immature Granulocytes: 1 %
Lymphocytes Relative: 9 %
Lymphs Abs: 1.1 K/uL (ref 0.7–4.0)
Monocytes Absolute: 0.9 K/uL (ref 0.1–1.0)
Monocytes Relative: 7 %
Neutro Abs: 10.6 K/uL — ABNORMAL HIGH (ref 1.7–7.7)
Neutrophils Relative %: 82 %

## 2023-11-08 LAB — TECHNOLOGIST SMEAR REVIEW: Clinical Information: ELEVATED

## 2023-11-08 LAB — URINE CULTURE: Culture: 50000 — AB

## 2023-11-08 LAB — MAGNESIUM: Magnesium: 1.8 mg/dL (ref 1.7–2.4)

## 2023-11-08 MED ORDER — CEFAZOLIN SODIUM-DEXTROSE 1-4 GM/50ML-% IV SOLN: 1.0000 g | Freq: Three times a day (TID) | INTRAVENOUS | Status: DC

## 2023-11-08 MED ORDER — CEPHALEXIN 500 MG PO CAPS: 500.0000 mg | ORAL_CAPSULE | Freq: Three times a day (TID) | ORAL | Status: DC

## 2023-11-08 MED ORDER — CEFADROXIL 500 MG PO CAPS
1000.0000 mg | ORAL_CAPSULE | Freq: Two times a day (BID) | ORAL | 0 refills | Status: AC
  Filled 2023-11-08: qty 12, 3d supply, fill #0

## 2023-11-08 MED ORDER — CEFADROXIL 500 MG PO CAPS
1000.0000 mg | ORAL_CAPSULE | Freq: Two times a day (BID) | ORAL | Status: DC
  Filled 2023-11-08: qty 2

## 2023-11-08 NOTE — Progress Notes (Signed)
 TOC med in a secure bag delivered to patient in room by this RN

## 2023-11-08 NOTE — Discharge Summary (Signed)
 Physician Discharge Summary  [name] FMW:[id number] DOB: 11/16/1934 DOA: 11/05/2023  PCP: Mike Elsie JONETTA Mickey., MD  Admit date: 11/05/2023 Discharge date: 11/08/2023  Admitted From: Home Discharge disposition:  [name] today with home health services.  Recommendations at discharge:  Completed a course of antibiotics with 3 more days of oral cefadroxil  1000 mg twice daily.  Brief narrative: [name] is a 89 y.o. male with PMH significant for DM2, HTN, HLD, CAD s/p CABG, paroxysmal A-fib on Eliquis , CVA, CKD, BPH/depression, mild cognitive impairment. Lives in an independent living facility, able to ambulate with a walker 7/20, patient presented to the ED for evaluation of generalized weakness progressively worsening 2 weeks.  Workup in the ED showed a fever of 101.2, WBC count of 21,000 CT head unremarkable for acute findings, showed atrophy with stable ventriculomegaly and small vessel ischemic disease CT chest/abdomen/pelvis with contrast negative for acute findings in the chest.  Thick-walled bladder with mild perivesical stranding noted, possibly reflecting sequela of chronic bladder outlet obstruction or superimposed cystitis.  Prostatomegaly noted.  Patient was given IV ceftriaxone .   Admitted to [facility] Urine culture sent on admission grew Klebsiella pneumonia.  Subjective: Patient was seen and examined this afternoon. Pleasant elderly Caucasian male.  Walking back from the bathroom with the help of nursing assistant.  Son at bedside. I had a long conversation with patient and his son.  They are hoping for a discharge to [name] today with home health services. Chart reviewed In the last 24 hours, patient remains afebrile, hemodynamically stable Last set of labs from this morning with WBC count 12.9, hemoglobin normal, BUN/creatinine 25/1.16.  Hospital course: Sepsis POA Klebsiella pneumonia UTI Patient presented with leukocytosis, fever,  progressive weakness.   Imaging showed possible cystitis  Urine culture grew Klebsiella pneumonia.   No growth on blood culture. No fever.  WBC count seen evaluated today. Currently improving on IV Rocephin .  Based on sensitivities, plan to discharge on cefadroxil  1000 mg twice daily for next 3 days Recent Labs  Lab 11/05/23 1844 11/05/23 2008 11/05/23 2150 11/06/23 0527 11/07/23 0552 11/07/23 0646 11/08/23 0552  WBC 21.1*  --   --  21.1* 22.0*  --  12.9*  LATICACIDVEN  --  1.4 1.0  --   --   --   --   PROCALCITON  --   --   --  0.15  --  1.79  --    CKD 3b Creatinine stable Recent Labs    11/29/22 0953 12/07/22 1116 09/09/23 2139 11/05/23 1844 11/06/23 0527 11/07/23 0552 11/08/23 0552  BUN 27* 42* 30* 20 21 28* 25*  CREATININE 1.56* 1.54* 1.32* 1.29* 1.31* 1.48* 1.16   Hypokalemia Potassium level improved with replacement. Recent Labs  Lab 11/05/23 1844 11/06/23 0527 11/07/23 0552 11/08/23 0552  K 3.8 3.4* 4.6 3.9  MG  --   --  1.8 1.8   Hyponatremia Etiology likely secondary to hypovolemic hyponatremia in setting of poor oral intake in days preceding hospitalization. Sodium level improved with hydration. Recent Labs  Lab 11/05/23 1844 11/06/23 0527 11/07/23 0552 11/08/23 0552  NA 132* 133* 134* 136    Paroxysmal atrial fibrillation: In sinus rhythm with first-degree AV block on admission.   Continue acebutolol  200 mg PO BID Continue chronic anticoagulation with Eliquis  5 mg PO BID  Hypertension Continue acebutolol  and olmesartan , Lasix  as needed as before  Type 2 diabetes mellitus A1c 8.4 in 2024 PTA meds-metformin, Jardiance  Continue same Recent  Labs  Lab 11/07/23 1143 11/07/23 1614 11/07/23 2201 11/08/23 0830 11/08/23 1259  GLUCAP 313* 175* 193* 130* 202*   H/o CVA HLD Continue Eliquis , atorvastatin , Zetia .  CAD s/p CABG Stable, denies chest pain.  Continue Eliquis , atorvastatin , Zetia .    BPH Continue Flomax  0.4 mg PO daily.    Mild cognitive impairment Depression/anxiety Takes Lexapro  and Wellbutrin Delirium precautions Minimize use of mood altering medications   Generalized weakness: Generally weak, feels more so in both legs.  No focal deficits.  Has had falls without injury or syncope.  Currently residing at [facility]. PT/OT recommended SNF.  Family opted to choose home health PT/OT at discharge.   Goals of care   Code Status: Do not attempt resuscitation (DNR) PRE-ARREST INTERVENTIONS DESIRED   Diet:  Diet Order             Diet general           Diet heart healthy/carb modified Fluid consistency: Thin  Diet effective now                   Nutritional status:  Body mass index is 24.41 kg/m.       Wounds:  - Wound / Incision (Open or Dehisced) 10/02/22 Skin tear Arm Left;Lower;Posterior (Active)  Date First Assessed/Time First Assessed: 10/02/22 0200   Wound Type: Skin tear  Location: Arm  Location Orientation: Left;Lower;Posterior    Assessments 10/02/2022  2:00 AM 10/11/2022  8:01 PM  Dressing Type Tape dressing;Gauze (Comment) Foam - Lift dressing to assess site every shift  Dressing Status Clean, Dry, Intact --  Dressing Change Frequency PRN --     No associated orders.     Wound 11/06/23 0000 Traumatic Arm Left;Upper (Active)  Date First Assessed/Time First Assessed: 11/06/23 0000   Primary Wound Type: Traumatic  Secondary Wound Type - Traumatic: Skin Tear  Location: Arm  Location Orientation: Left;Upper    Assessments 11/06/2023 12:00 AM 11/07/2023  7:51 PM  Site / Wound Assessment Clean;Friable;Painful;Pink Dressing in place / Unable to assess  Peri-wound Assessment Pink;Ecchymotic --  Drainage Amount None None  Dressing Type Petroleum;Foam - Lift dressing to assess site every shift Foam - Lift dressing to assess site every shift;Petroleum  Dressing Changed New --  Dressing Status Clean, Dry, Intact Clean, Dry, Intact     No associated orders.     Wound 11/06/23  0000 Traumatic Arm Left;Lower (Active)  Date First Assessed/Time First Assessed: 11/06/23 0000   Primary Wound Type: Traumatic  Secondary Wound Type - Traumatic: Skin Tear  Location: Arm  Location Orientation: Left;Lower    Assessments 11/06/2023 12:00 AM 11/07/2023  7:51 PM  Site / Wound Assessment Clean;Friable;Painful;Pink Dressing in place / Unable to assess  Peri-wound Assessment Pink;Ecchymotic --  Drainage Amount None None  Dressing Type Petroleum;Foam - Lift dressing to assess site every shift Foam - Lift dressing to assess site every shift;Petroleum  Dressing Changed New --  Dressing Status -- Clean, Dry, Intact     No associated orders.    Discharge Exam:   Vitals:   11/07/23 1938 11/08/23 0426 11/08/23 0500 11/08/23 1322  BP: 114/71 (!) 141/68  127/84  Pulse: 74 81  81  Resp: 20 19    Temp: 98.4 F (36.9 C) 98.5 F (36.9 C)  98.2 F (36.8 C)  TempSrc: Oral Oral    SpO2: 94% 95%  93%  Weight:   75 kg   Height:  Body mass index is 24.41 kg/m.  General exam: Pleasant, elderly Caucasian male.  Not in distress Skin: No rashes, lesions or ulcers. HEENT: Atraumatic, normocephalic, no obvious bleeding Lungs: Clear to auscultation bilaterally,  CVS: S1, S2, no murmur,   GI/Abd: Soft, nontender, nondistended, bowel sound present,   CNS: Alert, awake, oriented x 3 Psychiatry: Mood appropriate Extremities: No pedal edema, no calf tenderness  Follow ups:    Follow-up Information     [facility] Follow up.   Specialty: Physical Therapy Why: Please follow up with this provider to begin home health PT/OT services upon discharge. Contact information: [address] [place] [address] [phone]         Mike Elsie JONETTA Mickey., MD Follow up.   Specialty: Internal Medicine Contact information: [address] [place] [address] [phone]                 Discharge Instructions:   Discharge Instructions      Call MD for:  difficulty breathing, headache or visual disturbances   Complete by: As directed    Call MD for:  extreme fatigue   Complete by: As directed    Call MD for:  hives   Complete by: As directed    Call MD for:  persistant dizziness or light-headedness   Complete by: As directed    Call MD for:  persistant nausea and vomiting   Complete by: As directed    Call MD for:  severe uncontrolled pain   Complete by: As directed    Call MD for:  temperature >100.4   Complete by: As directed    Diet general   Complete by: As directed    Discharge instructions   Complete by: As directed    Recommendations at discharge:   Completed a course of antibiotics with 3 more days of oral cefadroxil  1000 mg twice daily.  General discharge instructions: Follow with Primary MD Mike Elsie JONETTA Mickey., MD in 7 days  Please request your PCP  to go over your hospital tests, procedures, radiology results at the follow up. Please get your medicines reviewed and adjusted.  Your PCP may decide to repeat certain labs or tests as needed. Do not drive, operate heavy machinery, perform activities at heights, swimming or participation in water activities or provide baby sitting services if your were admitted for syncope or siezures until you have seen by Primary MD or a Neurologist and advised to do so again. [place]  Controlled Substance Reporting System database was reviewed. Do not drive, operate heavy machinery, perform activities at heights, swim, participate in water activities or provide baby-sitting services while on medications for pain, sleep and mood until your outpatient physician has reevaluated you and advised to do so again.  You are strongly recommended to comply with the dose, frequency and duration of prescribed medications. Activity: As tolerated with Full fall precautions use walker/cane & assistance as needed Avoid using any recreational substances like cigarette, tobacco, alcohol , or  non-prescribed drug. If you experience worsening of your admission symptoms, develop shortness of breath, life threatening emergency, suicidal or homicidal thoughts you must seek medical attention immediately by calling 911 or calling your MD immediately  if symptoms less severe. You must read complete instructions/literature along with all the possible adverse reactions/side effects for all the medicines you take and that have been prescribed to you. Take any new medicine only after you have completely understood and accepted all the possible adverse reactions/side  effects.  Wear Seat belts while driving. You were cared for by a hospitalist during your hospital stay. If you have any questions about your discharge medications or the care you received while you were in the hospital after you are discharged, you can call the unit and ask to speak with the hospitalist or the covering physician. Once you are discharged, your primary care physician will handle any further medical issues. Please note that NO REFILLS for any discharge medications will be authorized once you are discharged, as it is imperative that you return to your primary care physician (or establish a relationship with a primary care physician if you do not have one).   Discharge wound care:   Complete by: As directed    Increase activity slowly   Complete by: As directed        Discharge Medications:   Allergies as of 11/08/2023       Reactions   Ketorolac Tromethamine Other (See Comments)   nephrotic syndrome        Medication List     STOP taking these medications    Tresiba FlexTouch 200 UNIT/ML FlexTouch Pen Generic drug: insulin  degludec       TAKE these medications    acebutolol  200 MG capsule Commonly known as: SECTRAL  Take 1 capsule (200 mg total) by mouth 2 (two) times daily.   apixaban  5 MG Tabs tablet Commonly known as: Eliquis  Take 1 tablet (5 mg total) by mouth 2 (two) times daily.   atorvastatin   80 MG tablet Commonly known as: LIPITOR  Take 1 tablet (80 mg total) by mouth daily. What changed: when to take this   buPROPion 150 MG 24 hr tablet Commonly known as: WELLBUTRIN XL Take 150 mg by mouth every morning.   cefadroxil  500 MG capsule Commonly known as: DURICEF Take 2 capsules (1,000 mg total) by mouth 2 (two) times daily for 3 days.   empagliflozin  25 MG Tabs tablet Commonly known as: JARDIANCE  Take 1 tablet (25 mg total) by mouth daily. What changed: when to take this   escitalopram  10 MG tablet Commonly known as: LEXAPRO  Take 10 mg by mouth daily.   ezetimibe  10 MG tablet Commonly known as: ZETIA  Take 10 mg by mouth daily.   furosemide  40 MG tablet Commonly known as: LASIX  Take 1 tablet (40 mg total) by mouth daily as needed for edema.   metFORMIN 1000 MG tablet Commonly known as: GLUCOPHAGE Take 1,000 mg by mouth 2 (two) times daily with a meal.   mirabegron  ER 50 MG Tb24 tablet Commonly known as: MYRBETRIQ  Take 1 tablet (50 mg total) by mouth daily.   olmesartan  20 MG tablet Commonly known as: BENICAR  Take 1 tablet (20 mg total) by mouth daily.   tadalafil  5 MG tablet Commonly known as: CIALIS  Take 1 tablet (5 mg total) by mouth daily. What changed: when to take this   tamsulosin  0.4 MG Caps capsule Commonly known as: FLOMAX  Take 1 capsule (0.4 mg total) by mouth daily after supper. What changed: when to take this   Vitamin D  (Ergocalciferol ) 1.25 MG (50000 UNIT) Caps capsule Commonly known as: DRISDOL  Take 1 capsule (50,000 Units total) by mouth every 7 (seven) days.               Discharge Care Instructions  (From admission, onward)           Start     Ordered   11/08/23 0000  Discharge wound care:  11/08/23 1448             The results of significant diagnostics from this hospitalization (including imaging, microbiology, ancillary and laboratory) are listed below for reference.    Procedures and Diagnostic  Studies:   CT CHEST ABDOMEN PELVIS W CONTRAST Result Date: 11/05/2023 EXAM: CT CHEST, ABDOMEN AND PELVIS WITH CONTRAST 11/05/2023 09:06:21 PM TECHNIQUE: CT of the chest, abdomen and pelvis was performed with the administration of intravenous contrast. Multiplanar reformatted images are provided for review. Automated exposure control, iterative reconstruction, and/or weight based adjustment of the mA/kV was utilized to reduce the radiation dose to as low as reasonably achievable. COMPARISON: CT abdomen and pelvis dated 09/09/2023 and CTA chest dated 12/29/2021. CLINICAL HISTORY: Sepsis. Table formatting from the original note was not included. Images from the original note were not included. Pt bib EMS for onset of weakness through the day. Started the day using walker and was at baseline. This afternoon he was unable to get up and use walker. Took PRN lasix  this morning not normal occurrence. Pt had swelling in legs. Missed eliquis  for 2 nights, BP meds, and enlarged prostate. CBG 183. FINDINGS: CHEST: MEDIASTINUM: Mild cardiomegaly. Postsurgical changes related to prior CABG. Median sternotomy. THORACIC LYMPH NODES: No mediastinal, hilar or axillary lymphadenopathy. LUNGS AND PLEURA: Mild dependent atelectasis in the posterior right middle lobe, lingula, and bilateral lower lobes. No focal consolidation or pulmonary edema. No pleural effusion or pneumothorax. ABDOMEN AND PELVIS: LIVER: The liver is unremarkable. GALLBLADDER AND BILE DUCTS: Layering small gallstones (image 57), without associated inflammatory changes. No biliary ductal dilatation. SPLEEN: No acute abnormality. PANCREAS: No acute abnormality. ADRENAL GLANDS: No acute abnormality. KIDNEYS, URETERS AND BLADDER: 3.0 cm simple right upper pole renal cyst (image 54), benign (Bosniak 1). Per consensus, no follow-up is needed for simple Bosniak type 1 and 2 renal cysts, unless the patient has a malignancy history or risk factors. No stones in the kidneys  or ureters. No hydronephrosis. No perinephric or periureteral stranding. Thick walled bladder with mild perivesical stranding (image 102), possibly reflecting sequela of chronic bladder outlet obstruction, although superimposed cystitis as possible. GI AND BOWEL: Stomach demonstrates no acute abnormality. There is no bowel obstruction. Appendix is not discretely visualized. REPRODUCTIVE ORGANS: Prostatomegaly, with enlargement of the central gland indenting the base of the bladder, suggesting BPH. PERITONEUM AND RETROPERITONEUM: No ascites. No free air. VASCULATURE: Atherosclerotic calcifications of the abdominal aorta and branch vessels, although patent. Thoracic aortic atherosclerosis. ABDOMINAL AND PELVIS LYMPH NODES: No lymphadenopathy. BONES AND SOFT TISSUES: Healing right posterior tenth and 11th rib fractures. Healed right transverse process fractures at L1-3. Degenerative changes of the visualized thoracolumbar spine. IMPRESSION: 1. No acute findings in the chest. 2. Thick-walled bladder with mild perivesical stranding, possibly reflecting sequela of chronic bladder outlet obstruction or superimposed cystitis. 3. Prostatomegaly. Electronically signed by: Pinkie Pebbles MD 11/05/2023 09:19 PM EDT RP Workstation: [id number]   CT Head Wo Contrast Result Date: 11/05/2023 EXAM: CT HEAD WITHOUT CONTRAST 11/05/2023 09:06:21 PM TECHNIQUE: CT of the head was performed without the administration of intravenous contrast. Automated exposure control, iterative reconstruction, and/or weight based adjustment of the mA/kV was utilized to reduce the radiation dose to as low as reasonably achievable. COMPARISON: 09/09/2023 CLINICAL HISTORY: Mental status change, unknown cause. Pt bib EMS for onset of weakness through the day. Started the day using walker and was at baseline. This afternoon he was unable to get up and use walker. Took PRN lasix  this morning not normal occurrence. Pt  had swelling in legs. Missed eliquis   for 2 nights, BP meds, and enlarged prostate. CBG 183. FINDINGS: BRAIN AND VENTRICLES: No acute hemorrhage. Gray-white differentiation is preserved. No extra-axial collection. No mass effect or midline shift. Global cortical and central atrophy. Stable ventriculomegaly. Mild subcortical and periventricular small vessel ischemic changes. Mild intracranial atherosclerosis. ORBITS: No acute abnormality. SINUSES: Mild mucosal thickening of the bilateral maxillary sinuses. SOFT TISSUES AND SKULL: No acute soft tissue abnormality. No skull fracture. IMPRESSION: 1. No acute intracranial abnormality. 2. Atrophy with stable ventriculomegaly. Small vessel ischemic changes. Electronically signed by: Pinkie Pebbles MD 11/05/2023 09:13 PM EDT RP Workstation: [id number]   DG Chest Port 1 View Result Date: 11/05/2023 CLINICAL DATA:  Weakness. EXAM: PORTABLE CHEST 1 VIEW COMPARISON:  November 29, 2022 FINDINGS: Multiple sternal wires and vascular clips are present. The heart size and mediastinal contours are within normal limits. A radiopaque loop recorder device is in place. Low lung volumes are noted. Mild linear scarring and/or atelectasis is seen within the left lung base. The visualized skeletal structures are unremarkable. IMPRESSION: 1. Evidence of prior median sternotomy/CABG. 2. Mild left basilar linear scarring and/or atelectasis. Electronically Signed   By: Suzen Dials M.D.   On: 11/05/2023 19:56     Labs:   Basic Metabolic Panel: Recent Labs  Lab 11/05/23 1844 11/06/23 0527 11/07/23 0552 11/08/23 0552  NA 132* 133* 134* 136  K 3.8 3.4* 4.6 3.9  CL 95* 97* 99 101  CO2 26 26 23 25   GLUCOSE 261* 276* 125* 124*  BUN 20 21 28* 25*  CREATININE 1.29* 1.31* 1.48* 1.16  CALCIUM  9.3 9.3 9.2 8.7*  MG  --   --  1.8 1.8   GFR Estimated Creatinine Clearance: 44.9 mL/min (by C-G formula based on SCr of 1.16 mg/dL). Liver Function Tests: Recent Labs  Lab 11/05/23 1844  AST 34  ALT 26  ALKPHOS 94   BILITOT 1.0  PROT 6.9  ALBUMIN 3.7   No results for input(s): LIPASE, AMYLASE in the last 168 hours. No results for input(s): AMMONIA in the last 168 hours. Coagulation profile No results for input(s): INR, PROTIME in the last 168 hours.  CBC: Recent Labs  Lab 11/05/23 1844 11/06/23 0527 11/07/23 0552 11/08/23 0552  WBC 21.1* 21.1* 22.0* 12.9*  NEUTROABS  --   --   --  10.6*  HGB 14.5 14.8 14.6 13.1  HCT 43.2 43.3 41.5 40.0  MCV 97.1 96.4 94.7 98.0  PLT 198 171 152 157   Cardiac Enzymes: No results for input(s): CKTOTAL, CKMB, CKMBINDEX, TROPONINI in the last 168 hours. BNP: Invalid input(s): POCBNP CBG: Recent Labs  Lab 11/07/23 1143 11/07/23 1614 11/07/23 2201 11/08/23 0830 11/08/23 1259  GLUCAP 313* 175* 193* 130* 202*   D-Dimer No results for input(s): DDIMER in the last 72 hours. Hgb A1c No results for input(s): HGBA1C in the last 72 hours. Lipid Profile No results for input(s): CHOL, HDL, LDLCALC, TRIG, CHOLHDL, LDLDIRECT in the last 72 hours. Thyroid  function studies No results for input(s): TSH, T4TOTAL, T3FREE, THYROIDAB in the last 72 hours.  Invalid input(s): FREET3 Anemia work up No results for input(s): VITAMINB12, FOLATE, FERRITIN, TIBC, IRON, RETICCTPCT in the last 72 hours. Microbiology Recent Results (from the past 240 hours)  Blood culture (routine x 2)     Status: None (Preliminary result)   Collection Time: 11/05/23  7:55 PM   Specimen: BLOOD  Result Value Ref Range Status   Specimen Description   Final  BLOOD BLOOD LEFT ARM Performed at [facility], [address] [place], [place] [address]    Special Requests   Final    BOTTLES DRAWN AEROBIC AND ANAEROBIC Blood Culture adequate volume Performed at [facility], [address], [place], [place] [address]    Culture   Final    NO GROWTH 3 DAYS Performed at [facility],  [address], [place], [place] [address]    Report Status PENDING  Incomplete  Resp panel by RT-PCR (RSV, Flu A&B, Covid) Anterior Nasal Swab     Status: None   Collection Time: 11/05/23  8:16 PM   Specimen: Anterior Nasal Swab  Result Value Ref Range Status   SARS Coronavirus 2 by RT PCR NEGATIVE NEGATIVE Final    Comment: (NOTE) SARS-CoV-2 target nucleic acids are NOT DETECTED.  The SARS-CoV-2 RNA is generally detectable in upper respiratory specimens during the acute phase of infection. The lowest concentration of SARS-CoV-2 viral copies this assay can detect is 138 copies/mL. A negative result does not preclude SARS-Cov-2 infection and should not be used as the sole basis for treatment or other patient management decisions. A negative result may occur with  improper specimen collection/handling, submission of specimen other than nasopharyngeal swab, presence of viral mutation(s) within the areas targeted by this assay, and inadequate number of viral copies(<138 copies/mL). A negative result must be combined with clinical observations, patient history, and epidemiological information. The expected result is Negative.  Fact Sheet for Patients:  BloggerCourse.com  Fact Sheet for Healthcare Providers:  SeriousBroker.it  This test is no t yet approved or cleared by the [place]  FDA and  has been authorized for detection and/or diagnosis of SARS-CoV-2 by FDA under an Emergency Use Authorization (EUA). This EUA will remain  in effect (meaning this test can be used) for the duration of the COVID-19 declaration under Section 564(b)(1) of the Act, 21 U.S.C.section 360bbb-3(b)(1), unless the authorization is terminated  or revoked sooner.       Influenza A by PCR NEGATIVE NEGATIVE Final   Influenza B by PCR NEGATIVE NEGATIVE Final    Comment: (NOTE) The Xpert Xpress SARS-CoV-2/FLU/RSV plus assay is intended as an aid in the  diagnosis of influenza from Nasopharyngeal swab specimens and should not be used as a sole basis for treatment. Nasal washings and aspirates are unacceptable for Xpert Xpress SARS-CoV-2/FLU/RSV testing.  Fact Sheet for Patients: BloggerCourse.com  Fact Sheet for Healthcare Providers: SeriousBroker.it  This test is not yet approved or cleared by the [place]  FDA and has been authorized for detection and/or diagnosis of SARS-CoV-2 by FDA under an Emergency Use Authorization (EUA). This EUA will remain in effect (meaning this test can be used) for the duration of the COVID-19 declaration under Section 564(b)(1) of the Act, 21 U.S.C. section 360bbb-3(b)(1), unless the authorization is terminated or revoked.     Resp Syncytial Virus by PCR NEGATIVE NEGATIVE Final    Comment: (NOTE) Fact Sheet for Patients: BloggerCourse.com  Fact Sheet for Healthcare Providers: SeriousBroker.it  This test is not yet approved or cleared by the [place]  FDA and has been authorized for detection and/or diagnosis of SARS-CoV-2 by FDA under an Emergency Use Authorization (EUA). This EUA will remain in effect (meaning this test can be used) for the duration of the COVID-19 declaration under Section 564(b)(1) of the Act, 21 U.S.C. section 360bbb-3(b)(1), unless the authorization is terminated or revoked.  Performed at [facility], [address] [place], [place] [address]   Urine Culture     Status: Abnormal   Collection Time: 11/05/23  9:06 PM   Specimen: Urine, Clean Catch  Result Value Ref Range Status   Specimen Description   Final    URINE, CLEAN CATCH Performed at [facility], [address] [place], [place] [address]    Special Requests   Final    NONE Performed at [facility], [address] [place], [place] [address]     Culture 50,000 COLONIES/mL KLEBSIELLA PNEUMONIAE (A)  Final   Report Status 11/08/2023 FINAL  Final   Organism ID, Bacteria KLEBSIELLA PNEUMONIAE (A)  Final      Susceptibility   Klebsiella pneumoniae - MIC*    AMPICILLIN >=32 RESISTANT Resistant     CEFAZOLIN  <=4 SENSITIVE Sensitive     CEFEPIME <=0.12 SENSITIVE Sensitive     CEFTRIAXONE  <=0.25 SENSITIVE Sensitive     CIPROFLOXACIN <=0.25 SENSITIVE Sensitive     GENTAMICIN <=1 SENSITIVE Sensitive     IMIPENEM <=0.25 SENSITIVE Sensitive     NITROFURANTOIN 64 INTERMEDIATE Intermediate     TRIMETH/SULFA <=20 SENSITIVE Sensitive     AMPICILLIN/SULBACTAM 4 SENSITIVE Sensitive     PIP/TAZO <=4 SENSITIVE Sensitive ug/mL    * 50,000 COLONIES/mL KLEBSIELLA PNEUMONIAE  Blood culture (routine x 2)     Status: None (Preliminary result)   Collection Time: 11/05/23  9:50 PM   Specimen: BLOOD  Result Value Ref Range Status   Specimen Description   Final    BLOOD RIGHT ANTECUBITAL Performed at [facility], [address] [place], [place] [address]    Special Requests   Final    BOTTLES DRAWN AEROBIC AND ANAEROBIC Blood Culture adequate volume Performed at [facility], [address], [place], [place] [address]    Culture   Final    NO GROWTH 2 DAYS Performed at [facility], [address], [place], [place] [address]    Report Status PENDING  Incomplete    Time coordinating discharge: 45 minutes  Signed: Karron Goens  [facility] 11/08/2023, 2:48 PM

## 2023-11-08 NOTE — TOC Progression Note (Addendum)
 Transition of Care [facility]) - Progression Note    Patient Details  Name: [name] MRN: [id number] Date of Birth: April 15, 1935  [facility]) CM/SW Contact  Heather DELENA Saltness, LCSW Phone Number: 11/08/2023, 9:33 AM  Clinical Narrative:     ADDENDUM  12:45 PM - CSW spoke with [name] [phone], at [facility], via phone call. Per [name] [name] is able to accept pt for [facility] PT/OT services upon discharge. Will need HH orders. Pt's daughter made aware and in agreement with discharge plan. TOC will continue to follow.  CSW spoke with pt's daughter, Hendricks Elbe [phone], via phone call to discuss SNF bed preference. Per pt's daughter, after reviewing bed choices and discussing it with family, they would like to pursue [facility] PT/OT services at [facility] ILF. CSW then spoke with [name] admissions staff [phone], who reports [employer] [facility] provided [facility] services at facility. CSW left voicemail for [name], at [facility] services to submit referral. TOC will continue to follow.   Expected Discharge Plan: Skilled Nursing Facility Barriers to Discharge: Continued Medical Work up              Expected Discharge Plan and Services In-house Referral: NA Discharge Planning Services: NA Post Acute Care Choice: Skilled Nursing Facility Living arrangements for the past 2 months: Independent Living Facility                 DME Arranged: N/A DME Agency: NA       HH Arranged: NA HH Agency: NA       Social Drivers of Health (SDOH) Interventions SDOH Screenings   Depression (PHQ2-9): Medium Risk (03/20/2023)  Tobacco Use: Low Risk  (08/01/2023)    Readmission Risk Interventions     No data to display          Heather Saltness, MSW, LCSW Clinical Social Worker Inpatient Care Management 11/08/2023 9:36 AM

## 2023-11-08 NOTE — NC FL2 (Deleted)
 [place]  MEDICAID FL2 LEVEL OF CARE FORM     IDENTIFICATION  Patient Name: [name] Birthdate: 1935/01/24 Sex: male Admission Date (Current Location): 11/05/2023  [facility] and [place] Number:  Producer, television/film/video and Address:  [facility],  [address] [place] [address]      Provider Number: [id number]  Attending Physician Name and Address:  Arlice Reichert, MD  Relative Name and Phone Number:  Jerrye Raring (Daughter)  [phone]    Current Level of Care: Hospital Recommended Level of Care: Assisted Living Facility Prior Approval Number:    Date Approved/Denied:   PASRR Number: [id number]  Discharge Plan: Domiciliary (Rest home) [facility] ILF)    Current Diagnoses: Patient Active Problem List   Diagnosis Date Noted   SIRS (systemic inflammatory response syndrome) (HCC) 11/05/2023   Generalized weakness 11/05/2023   Paroxysmal atrial fibrillation (HCC) 11/05/2023   Insulin  dependent type 2 diabetes mellitus (HCC) 11/05/2023   Hypertension associated with diabetes (HCC) 11/05/2023   History of CVA (cerebrovascular accident) 11/05/2023   MCI (mild cognitive impairment) 11/05/2023   Ischemic cerebrovascular accident (CVA) (HCC) 10/06/2022   Status post surgery 10/02/2022   Stroke: Multiple scattered bilateral ischemic infarcts s/p thrombectomy of left P1/PCA TICI 3 Etiology: Likely cardioembolic 10/02/2022   Hemianopsia 10/01/2022   Right homonymous hemianopsia 10/01/2022   Loop recorder: Biotronic Biomonitor II loop 07/28/2021 07/28/2021   Unsteady gait 08/05/2020   BPH with obstruction/lower urinary tract symptoms 01/21/2020   Depression with anxiety 02/02/2018   Right hip pain 10/02/2017   Recurrent major depression in remission (HCC) 01/27/2017   Chronic kidney disease, stage 3a (HCC) 07/17/2015   History of colonic polyps 03/26/2013   Obesity 10/04/2012   OBSTRUCTIVE SLEEP APNEA 10/30/2008   Hyperlipidemia associated with  type 2 diabetes mellitus (HCC) 07/01/2008   HYPERTENSION, BENIGN 07/01/2008   CAD, NATIVE VESSEL 07/01/2008   FATIGUE / MALAISE 07/01/2008   EDEMA 07/01/2008    Orientation RESPIRATION BLADDER Height & Weight     Self, Place, Situation, Time  Normal Incontinent Weight: 165 lb 4.8 oz (75 kg) Height:  5' 9 (175.3 cm)  BEHAVIORAL SYMPTOMS/MOOD NEUROLOGICAL BOWEL NUTRITION STATUS      Incontinent Diet (Regular)  AMBULATORY STATUS COMMUNICATION OF NEEDS Skin   Limited Assist Verbally Normal                       Personal Care Assistance Level of Assistance  Bathing, Feeding, Dressing Bathing Assistance: Limited assistance Feeding assistance: Limited assistance Dressing Assistance: Limited assistance     Functional Limitations Info  Speech, Hearing, Sight Sight Info: Impaired Hearing Info: Adequate Speech Info: Adequate    SPECIAL CARE FACTORS FREQUENCY  PT (By licensed PT), OT (By licensed OT)     PT Frequency: 2-3x per week OT Frequency: 2-3x per week            Contractures Contractures Info: Not present    Additional Factors Info  Code Status, Allergies Code Status Info: DNR Allergies Info: Ketorolac Tromethamine           Current Medications (11/08/2023):  This is the current hospital active medication list Current Facility-Administered Medications  Medication Dose Route Frequency Provider Last Rate Last Admin   acebutolol  (SECTRAL ) capsule 200 mg  200 mg Oral BID Patel, Vishal R, MD   200 mg at 11/08/23 9082   acetaminophen  (TYLENOL ) tablet 650 mg  650 mg Oral Q6H PRN Patel, Vishal R, MD   650  mg at 11/06/23 9670   Or   acetaminophen  (TYLENOL ) suppository 650 mg  650 mg Rectal Q6H PRN Patel, Vishal R, MD       apixaban  (ELIQUIS ) tablet 5 mg  5 mg Oral BID Patel, Vishal R, MD   5 mg at 11/08/23 9081   atorvastatin  (LIPITOR ) tablet 80 mg  80 mg Oral Daily Patel, Vishal R, MD   80 mg at 11/08/23 9082   cefadroxil  (DURICEF) capsule 1,000 mg  1,000 mg  Oral BID Dahal, Binaya, MD       escitalopram  (LEXAPRO ) tablet 10 mg  10 mg Oral Daily Patel, Vishal R, MD   10 mg at 11/08/23 9082   ezetimibe  (ZETIA ) tablet 10 mg  10 mg Oral Daily Patel, Vishal R, MD   10 mg at 11/08/23 9082   insulin  aspart (novoLOG ) injection 0-5 Units  0-5 Units Subcutaneous QHS Patel, Vishal R, MD       insulin  aspart (novoLOG ) injection 0-9 Units  0-9 Units Subcutaneous TID WC Patel, Vishal R, MD   3 Units at 11/08/23 1300   irbesartan  (AVAPRO ) tablet 150 mg  150 mg Oral Daily Patel, Vishal R, MD   150 mg at 11/08/23 9082   ondansetron  (ZOFRAN ) tablet 4 mg  4 mg Oral Q6H PRN Patel, Vishal R, MD       Or   ondansetron  (ZOFRAN ) injection 4 mg  4 mg Intravenous Q6H PRN Patel, Vishal R, MD       senna-docusate (Senokot-S) tablet 1 tablet  1 tablet Oral QHS PRN Patel, Vishal R, MD       sodium chloride  flush (NS) 0.9 % injection 3 mL  3 mL Intravenous Q12H Patel, Vishal R, MD   3 mL at 11/08/23 9081   tamsulosin  (FLOMAX ) capsule 0.4 mg  0.4 mg Oral QPC supper Patel, Vishal R, MD   0.4 mg at 11/07/23 1702     Discharge Medications: acebutolol  200 MG capsule Commonly known as: SECTRAL  Take 1 capsule (200 mg total) by mouth 2 (two) times daily.    apixaban  5 MG Tabs tablet Commonly known as: Eliquis  Take 1 tablet (5 mg total) by mouth 2 (two) times daily.    atorvastatin  80 MG tablet Commonly known as: LIPITOR  Take 1 tablet (80 mg total) by mouth daily. What changed: when to take this    buPROPion 150 MG 24 hr tablet Commonly known as: WELLBUTRIN XL Take 150 mg by mouth every morning.    cefadroxil  500 MG capsule Commonly known as: DURICEF Take 2 capsules (1,000 mg total) by mouth 2 (two) times daily for 3 days.    empagliflozin  25 MG Tabs tablet Commonly known as: JARDIANCE  Take 1 tablet (25 mg total) by mouth daily. What changed: when to take this    escitalopram  10 MG tablet Commonly known as: LEXAPRO  Take 10 mg by mouth daily.    ezetimibe  10 MG  tablet Commonly known as: ZETIA  Take 10 mg by mouth daily.    furosemide  40 MG tablet Commonly known as: LASIX  Take 1 tablet (40 mg total) by mouth daily as needed for edema.    metFORMIN 1000 MG tablet Commonly known as: GLUCOPHAGE Take 1,000 mg by mouth 2 (two) times daily with a meal.    mirabegron  ER 50 MG Tb24 tablet Commonly known as: MYRBETRIQ  Take 1 tablet (50 mg total) by mouth daily.    olmesartan  20 MG tablet Commonly known as: BENICAR  Take 1 tablet (20 mg total) by mouth daily.  tadalafil  5 MG tablet Commonly known as: CIALIS  Take 1 tablet (5 mg total) by mouth daily. What changed: when to take this    tamsulosin  0.4 MG Caps capsule Commonly known as: FLOMAX  Take 1 capsule (0.4 mg total) by mouth daily after supper. What changed: when to take this    Vitamin D  (Ergocalciferol ) 1.25 MG (50000 UNIT) Caps capsule Commonly known as: DRISDOL  Take 1 capsule (50,000 Units total) by mouth every 7 (seven) days.    Relevant Imaging Results:  Relevant Lab Results:   Additional Information SSN: [id number]  Heather LABOR Cannan Beeck, LCSW

## 2023-11-08 NOTE — TOC Transition Note (Signed)
 Transition of Care [facility]) - Discharge Note   Patient Details  Name: [name] MRN: [id number] Date of Birth: Oct 10, 1934  Transition [facility]) CM/SW Contact:  Heather DELENA Saltness, LCSW Phone Number: 11/08/2023, 3:24 PM   Clinical Narrative:    Pt discharging back to [facility] ILF today. Pt set up with [facility] PT/OT services through [facility]. CSW sent HH orders to [place] at [facility]. D/C packet placed in pt's chart at RN station. Family member to transport pt to facility. CSW notified pt's daughter, [name] [phone], via phone call of transfer. No further TOC needs at this time.   Final next level of care: Other (comment) ([facility] ILF) Barriers to Discharge: Barriers Resolved   Patient Goals and CMS Choice Patient states their goals for this hospitalization and ongoing recovery are:: Return to [facility] ILF CMS Medicare.gov Compare Post Acute Care list provided to:: Patient Choice offered to / list presented to : Patient, Adult Children [place] ownership interest in [facility].provided to:: Patient    Discharge Placement  [facility]              Patient to be transferred to facility by: PTAR Name of family member notified: pt's daughter, [name] Patient and family notified of of transfer: 11/08/23  Discharge Plan and Services Additional resources added to the After Visit Summary for   In-house Referral: NA Discharge Planning Services: NA Post Acute Care Choice: Skilled Nursing Facility          DME Arranged: N/A DME Agency: NA       HH Arranged: PT, OT HH Agency:  International aid/development worker HH services) Date HH Agency Contacted: 11/08/23 Time HH Agency Contacted: 1523 Representative spoke with at [facility] Agency: [name]  Social Drivers of Health (SDOH) Interventions SDOH Screenings   Depression (PHQ2-9): Medium Risk (03/20/2023)  Tobacco Use: Low Risk  (08/01/2023)     Readmission Risk Interventions     No data to display

## 2023-11-09 DIAGNOSIS — M62552 Muscle wasting and atrophy, not elsewhere classified, left thigh: Secondary | ICD-10-CM | POA: Diagnosis not present

## 2023-11-09 DIAGNOSIS — M62562 Muscle wasting and atrophy, not elsewhere classified, left lower leg: Secondary | ICD-10-CM | POA: Diagnosis not present

## 2023-11-09 DIAGNOSIS — R488 Other symbolic dysfunctions: Secondary | ICD-10-CM | POA: Diagnosis not present

## 2023-11-09 DIAGNOSIS — M62551 Muscle wasting and atrophy, not elsewhere classified, right thigh: Secondary | ICD-10-CM | POA: Diagnosis not present

## 2023-11-09 DIAGNOSIS — E1129 Type 2 diabetes mellitus with other diabetic kidney complication: Secondary | ICD-10-CM | POA: Diagnosis not present

## 2023-11-09 DIAGNOSIS — R4789 Other speech disturbances: Secondary | ICD-10-CM | POA: Diagnosis not present

## 2023-11-09 DIAGNOSIS — R41841 Cognitive communication deficit: Secondary | ICD-10-CM | POA: Diagnosis not present

## 2023-11-09 DIAGNOSIS — R2681 Unsteadiness on feet: Secondary | ICD-10-CM | POA: Diagnosis not present

## 2023-11-09 DIAGNOSIS — R296 Repeated falls: Secondary | ICD-10-CM | POA: Diagnosis not present

## 2023-11-09 DIAGNOSIS — M62561 Muscle wasting and atrophy, not elsewhere classified, right lower leg: Secondary | ICD-10-CM | POA: Diagnosis not present

## 2023-11-09 DIAGNOSIS — R278 Other lack of coordination: Secondary | ICD-10-CM | POA: Diagnosis not present

## 2023-11-09 DIAGNOSIS — R4185 Anosognosia: Secondary | ICD-10-CM | POA: Diagnosis not present

## 2023-11-10 DIAGNOSIS — R2681 Unsteadiness on feet: Secondary | ICD-10-CM | POA: Diagnosis not present

## 2023-11-10 DIAGNOSIS — R278 Other lack of coordination: Secondary | ICD-10-CM | POA: Diagnosis not present

## 2023-11-10 DIAGNOSIS — M62552 Muscle wasting and atrophy, not elsewhere classified, left thigh: Secondary | ICD-10-CM | POA: Diagnosis not present

## 2023-11-10 DIAGNOSIS — R41841 Cognitive communication deficit: Secondary | ICD-10-CM | POA: Diagnosis not present

## 2023-11-10 DIAGNOSIS — M62562 Muscle wasting and atrophy, not elsewhere classified, left lower leg: Secondary | ICD-10-CM | POA: Diagnosis not present

## 2023-11-10 DIAGNOSIS — R4185 Anosognosia: Secondary | ICD-10-CM | POA: Diagnosis not present

## 2023-11-10 DIAGNOSIS — R296 Repeated falls: Secondary | ICD-10-CM | POA: Diagnosis not present

## 2023-11-10 DIAGNOSIS — M62551 Muscle wasting and atrophy, not elsewhere classified, right thigh: Secondary | ICD-10-CM | POA: Diagnosis not present

## 2023-11-10 DIAGNOSIS — R4789 Other speech disturbances: Secondary | ICD-10-CM | POA: Diagnosis not present

## 2023-11-10 DIAGNOSIS — M62561 Muscle wasting and atrophy, not elsewhere classified, right lower leg: Secondary | ICD-10-CM | POA: Diagnosis not present

## 2023-11-10 DIAGNOSIS — R488 Other symbolic dysfunctions: Secondary | ICD-10-CM | POA: Diagnosis not present

## 2023-11-10 LAB — CULTURE, BLOOD (ROUTINE X 2)
Culture: NO GROWTH
Special Requests: ADEQUATE

## 2023-11-13 ENCOUNTER — Other Ambulatory Visit (HOSPITAL_COMMUNITY): Payer: Self-pay

## 2023-11-13 DIAGNOSIS — R41841 Cognitive communication deficit: Secondary | ICD-10-CM | POA: Diagnosis not present

## 2023-11-13 DIAGNOSIS — R488 Other symbolic dysfunctions: Secondary | ICD-10-CM | POA: Diagnosis not present

## 2023-11-13 DIAGNOSIS — M62551 Muscle wasting and atrophy, not elsewhere classified, right thigh: Secondary | ICD-10-CM | POA: Diagnosis not present

## 2023-11-13 DIAGNOSIS — E1129 Type 2 diabetes mellitus with other diabetic kidney complication: Secondary | ICD-10-CM | POA: Diagnosis not present

## 2023-11-13 DIAGNOSIS — R4185 Anosognosia: Secondary | ICD-10-CM | POA: Diagnosis not present

## 2023-11-13 DIAGNOSIS — R410 Disorientation, unspecified: Secondary | ICD-10-CM | POA: Diagnosis not present

## 2023-11-13 DIAGNOSIS — M62562 Muscle wasting and atrophy, not elsewhere classified, left lower leg: Secondary | ICD-10-CM | POA: Diagnosis not present

## 2023-11-13 DIAGNOSIS — R278 Other lack of coordination: Secondary | ICD-10-CM | POA: Diagnosis not present

## 2023-11-13 DIAGNOSIS — M62561 Muscle wasting and atrophy, not elsewhere classified, right lower leg: Secondary | ICD-10-CM | POA: Diagnosis not present

## 2023-11-13 DIAGNOSIS — R2681 Unsteadiness on feet: Secondary | ICD-10-CM | POA: Diagnosis not present

## 2023-11-13 DIAGNOSIS — G9389 Other specified disorders of brain: Secondary | ICD-10-CM | POA: Diagnosis not present

## 2023-11-13 DIAGNOSIS — N390 Urinary tract infection, site not specified: Secondary | ICD-10-CM | POA: Diagnosis not present

## 2023-11-13 DIAGNOSIS — M62552 Muscle wasting and atrophy, not elsewhere classified, left thigh: Secondary | ICD-10-CM | POA: Diagnosis not present

## 2023-11-13 DIAGNOSIS — R4789 Other speech disturbances: Secondary | ICD-10-CM | POA: Diagnosis not present

## 2023-11-13 DIAGNOSIS — R296 Repeated falls: Secondary | ICD-10-CM | POA: Diagnosis not present

## 2023-11-13 LAB — CULTURE, BLOOD (ROUTINE X 2)
Culture: NO GROWTH
Special Requests: ADEQUATE

## 2023-11-14 DIAGNOSIS — M62551 Muscle wasting and atrophy, not elsewhere classified, right thigh: Secondary | ICD-10-CM | POA: Diagnosis not present

## 2023-11-14 DIAGNOSIS — R2681 Unsteadiness on feet: Secondary | ICD-10-CM | POA: Diagnosis not present

## 2023-11-14 DIAGNOSIS — R488 Other symbolic dysfunctions: Secondary | ICD-10-CM | POA: Diagnosis not present

## 2023-11-14 DIAGNOSIS — M62561 Muscle wasting and atrophy, not elsewhere classified, right lower leg: Secondary | ICD-10-CM | POA: Diagnosis not present

## 2023-11-14 DIAGNOSIS — R296 Repeated falls: Secondary | ICD-10-CM | POA: Diagnosis not present

## 2023-11-14 DIAGNOSIS — M62562 Muscle wasting and atrophy, not elsewhere classified, left lower leg: Secondary | ICD-10-CM | POA: Diagnosis not present

## 2023-11-14 DIAGNOSIS — R4185 Anosognosia: Secondary | ICD-10-CM | POA: Diagnosis not present

## 2023-11-14 DIAGNOSIS — R41841 Cognitive communication deficit: Secondary | ICD-10-CM | POA: Diagnosis not present

## 2023-11-14 DIAGNOSIS — R278 Other lack of coordination: Secondary | ICD-10-CM | POA: Diagnosis not present

## 2023-11-14 DIAGNOSIS — R4789 Other speech disturbances: Secondary | ICD-10-CM | POA: Diagnosis not present

## 2023-11-14 DIAGNOSIS — M62552 Muscle wasting and atrophy, not elsewhere classified, left thigh: Secondary | ICD-10-CM | POA: Diagnosis not present

## 2023-11-15 DIAGNOSIS — R2681 Unsteadiness on feet: Secondary | ICD-10-CM | POA: Diagnosis not present

## 2023-11-15 DIAGNOSIS — R296 Repeated falls: Secondary | ICD-10-CM | POA: Diagnosis not present

## 2023-11-15 DIAGNOSIS — R4789 Other speech disturbances: Secondary | ICD-10-CM | POA: Diagnosis not present

## 2023-11-15 DIAGNOSIS — M62551 Muscle wasting and atrophy, not elsewhere classified, right thigh: Secondary | ICD-10-CM | POA: Diagnosis not present

## 2023-11-15 DIAGNOSIS — R41841 Cognitive communication deficit: Secondary | ICD-10-CM | POA: Diagnosis not present

## 2023-11-15 DIAGNOSIS — R4185 Anosognosia: Secondary | ICD-10-CM | POA: Diagnosis not present

## 2023-11-15 DIAGNOSIS — M62552 Muscle wasting and atrophy, not elsewhere classified, left thigh: Secondary | ICD-10-CM | POA: Diagnosis not present

## 2023-11-15 DIAGNOSIS — R278 Other lack of coordination: Secondary | ICD-10-CM | POA: Diagnosis not present

## 2023-11-15 DIAGNOSIS — M62562 Muscle wasting and atrophy, not elsewhere classified, left lower leg: Secondary | ICD-10-CM | POA: Diagnosis not present

## 2023-11-15 DIAGNOSIS — M62561 Muscle wasting and atrophy, not elsewhere classified, right lower leg: Secondary | ICD-10-CM | POA: Diagnosis not present

## 2023-11-15 DIAGNOSIS — R488 Other symbolic dysfunctions: Secondary | ICD-10-CM | POA: Diagnosis not present

## 2023-11-16 DIAGNOSIS — R296 Repeated falls: Secondary | ICD-10-CM | POA: Diagnosis not present

## 2023-11-16 DIAGNOSIS — R41841 Cognitive communication deficit: Secondary | ICD-10-CM | POA: Diagnosis not present

## 2023-11-16 DIAGNOSIS — R2681 Unsteadiness on feet: Secondary | ICD-10-CM | POA: Diagnosis not present

## 2023-11-16 DIAGNOSIS — R4789 Other speech disturbances: Secondary | ICD-10-CM | POA: Diagnosis not present

## 2023-11-16 DIAGNOSIS — M62561 Muscle wasting and atrophy, not elsewhere classified, right lower leg: Secondary | ICD-10-CM | POA: Diagnosis not present

## 2023-11-16 DIAGNOSIS — R4185 Anosognosia: Secondary | ICD-10-CM | POA: Diagnosis not present

## 2023-11-16 DIAGNOSIS — M62551 Muscle wasting and atrophy, not elsewhere classified, right thigh: Secondary | ICD-10-CM | POA: Diagnosis not present

## 2023-11-16 DIAGNOSIS — R488 Other symbolic dysfunctions: Secondary | ICD-10-CM | POA: Diagnosis not present

## 2023-11-16 DIAGNOSIS — M62562 Muscle wasting and atrophy, not elsewhere classified, left lower leg: Secondary | ICD-10-CM | POA: Diagnosis not present

## 2023-11-16 DIAGNOSIS — M62552 Muscle wasting and atrophy, not elsewhere classified, left thigh: Secondary | ICD-10-CM | POA: Diagnosis not present

## 2023-11-16 DIAGNOSIS — R278 Other lack of coordination: Secondary | ICD-10-CM | POA: Diagnosis not present

## 2023-11-17 DIAGNOSIS — R278 Other lack of coordination: Secondary | ICD-10-CM | POA: Diagnosis not present

## 2023-11-17 DIAGNOSIS — R4185 Anosognosia: Secondary | ICD-10-CM | POA: Diagnosis not present

## 2023-11-17 DIAGNOSIS — R41841 Cognitive communication deficit: Secondary | ICD-10-CM | POA: Diagnosis not present

## 2023-11-17 DIAGNOSIS — R2681 Unsteadiness on feet: Secondary | ICD-10-CM | POA: Diagnosis not present

## 2023-11-17 DIAGNOSIS — R488 Other symbolic dysfunctions: Secondary | ICD-10-CM | POA: Diagnosis not present

## 2023-11-17 DIAGNOSIS — R4789 Other speech disturbances: Secondary | ICD-10-CM | POA: Diagnosis not present

## 2023-11-17 DIAGNOSIS — M62561 Muscle wasting and atrophy, not elsewhere classified, right lower leg: Secondary | ICD-10-CM | POA: Diagnosis not present

## 2023-11-17 DIAGNOSIS — R296 Repeated falls: Secondary | ICD-10-CM | POA: Diagnosis not present

## 2023-11-19 ENCOUNTER — Emergency Department (HOSPITAL_COMMUNITY)

## 2023-11-19 ENCOUNTER — Encounter (HOSPITAL_COMMUNITY): Payer: Self-pay | Admitting: Emergency Medicine

## 2023-11-19 ENCOUNTER — Inpatient Hospital Stay (HOSPITAL_COMMUNITY)
Admission: EM | Admit: 2023-11-19 | Discharge: 2023-11-22 | DRG: 369 | Disposition: A | Discharge status: Home Health | Attending: Internal Medicine | Admitting: Internal Medicine

## 2023-11-19 ENCOUNTER — Other Ambulatory Visit: Payer: Self-pay

## 2023-11-19 DIAGNOSIS — K2970 Gastritis, unspecified, without bleeding: Secondary | ICD-10-CM

## 2023-11-19 DIAGNOSIS — E119 Type 2 diabetes mellitus without complications: Secondary | ICD-10-CM

## 2023-11-19 DIAGNOSIS — K920 Hematemesis: Secondary | ICD-10-CM | POA: Diagnosis not present

## 2023-11-19 DIAGNOSIS — Z8679 Personal history of other diseases of the circulatory system: Secondary | ICD-10-CM | POA: Diagnosis not present

## 2023-11-19 DIAGNOSIS — E1122 Type 2 diabetes mellitus with diabetic chronic kidney disease: Secondary | ICD-10-CM | POA: Diagnosis present

## 2023-11-19 DIAGNOSIS — Z87898 Personal history of other specified conditions: Secondary | ICD-10-CM

## 2023-11-19 DIAGNOSIS — F418 Other specified anxiety disorders: Secondary | ICD-10-CM | POA: Diagnosis not present

## 2023-11-19 DIAGNOSIS — K2270 Barrett's esophagus without dysplasia: Secondary | ICD-10-CM | POA: Diagnosis present

## 2023-11-19 DIAGNOSIS — K3189 Other diseases of stomach and duodenum: Secondary | ICD-10-CM | POA: Diagnosis not present

## 2023-11-19 DIAGNOSIS — I679 Cerebrovascular disease, unspecified: Secondary | ICD-10-CM | POA: Diagnosis not present

## 2023-11-19 DIAGNOSIS — I480 Paroxysmal atrial fibrillation: Secondary | ICD-10-CM | POA: Diagnosis not present

## 2023-11-19 DIAGNOSIS — E7849 Other hyperlipidemia: Secondary | ICD-10-CM | POA: Diagnosis not present

## 2023-11-19 DIAGNOSIS — R0989 Other specified symptoms and signs involving the circulatory and respiratory systems: Secondary | ICD-10-CM | POA: Diagnosis not present

## 2023-11-19 DIAGNOSIS — K259 Gastric ulcer, unspecified as acute or chronic, without hemorrhage or perforation: Secondary | ICD-10-CM | POA: Diagnosis present

## 2023-11-19 DIAGNOSIS — S0101XA Laceration without foreign body of scalp, initial encounter: Secondary | ICD-10-CM | POA: Diagnosis present

## 2023-11-19 DIAGNOSIS — G9389 Other specified disorders of brain: Secondary | ICD-10-CM | POA: Diagnosis not present

## 2023-11-19 DIAGNOSIS — Y92009 Unspecified place in unspecified non-institutional (private) residence as the place of occurrence of the external cause: Secondary | ICD-10-CM

## 2023-11-19 DIAGNOSIS — W19XXXA Unspecified fall, initial encounter: Secondary | ICD-10-CM | POA: Diagnosis not present

## 2023-11-19 DIAGNOSIS — I2510 Atherosclerotic heart disease of native coronary artery without angina pectoris: Secondary | ICD-10-CM | POA: Diagnosis not present

## 2023-11-19 DIAGNOSIS — R55 Syncope and collapse: Secondary | ICD-10-CM | POA: Diagnosis not present

## 2023-11-19 DIAGNOSIS — S0990XA Unspecified injury of head, initial encounter: Secondary | ICD-10-CM | POA: Diagnosis not present

## 2023-11-19 DIAGNOSIS — Z794 Long term (current) use of insulin: Secondary | ICD-10-CM

## 2023-11-19 DIAGNOSIS — K226 Gastro-esophageal laceration-hemorrhage syndrome: Secondary | ICD-10-CM | POA: Diagnosis not present

## 2023-11-19 DIAGNOSIS — E875 Hyperkalemia: Secondary | ICD-10-CM | POA: Diagnosis present

## 2023-11-19 DIAGNOSIS — R1111 Vomiting without nausea: Secondary | ICD-10-CM | POA: Diagnosis not present

## 2023-11-19 DIAGNOSIS — K2100 Gastro-esophageal reflux disease with esophagitis, without bleeding: Secondary | ICD-10-CM | POA: Diagnosis present

## 2023-11-19 DIAGNOSIS — I5032 Chronic diastolic (congestive) heart failure: Secondary | ICD-10-CM | POA: Diagnosis present

## 2023-11-19 DIAGNOSIS — Z8711 Personal history of peptic ulcer disease: Secondary | ICD-10-CM | POA: Diagnosis not present

## 2023-11-19 DIAGNOSIS — I10 Essential (primary) hypertension: Secondary | ICD-10-CM | POA: Diagnosis not present

## 2023-11-19 DIAGNOSIS — Z7984 Long term (current) use of oral hypoglycemic drugs: Secondary | ICD-10-CM

## 2023-11-19 DIAGNOSIS — D6832 Hemorrhagic disorder due to extrinsic circulating anticoagulants: Secondary | ICD-10-CM | POA: Diagnosis present

## 2023-11-19 DIAGNOSIS — M4856XA Collapsed vertebra, not elsewhere classified, lumbar region, initial encounter for fracture: Secondary | ICD-10-CM | POA: Diagnosis present

## 2023-11-19 DIAGNOSIS — Z7901 Long term (current) use of anticoagulants: Secondary | ICD-10-CM

## 2023-11-19 DIAGNOSIS — G3184 Mild cognitive impairment, so stated: Secondary | ICD-10-CM | POA: Diagnosis present

## 2023-11-19 DIAGNOSIS — K5900 Constipation, unspecified: Secondary | ICD-10-CM | POA: Diagnosis present

## 2023-11-19 DIAGNOSIS — N400 Enlarged prostate without lower urinary tract symptoms: Secondary | ICD-10-CM | POA: Diagnosis not present

## 2023-11-19 DIAGNOSIS — F411 Generalized anxiety disorder: Secondary | ICD-10-CM | POA: Diagnosis present

## 2023-11-19 DIAGNOSIS — D62 Acute posthemorrhagic anemia: Secondary | ICD-10-CM | POA: Diagnosis not present

## 2023-11-19 DIAGNOSIS — F32A Depression, unspecified: Secondary | ICD-10-CM | POA: Diagnosis present

## 2023-11-19 DIAGNOSIS — Z043 Encounter for examination and observation following other accident: Secondary | ICD-10-CM | POA: Diagnosis not present

## 2023-11-19 DIAGNOSIS — Z8249 Family history of ischemic heart disease and other diseases of the circulatory system: Secondary | ICD-10-CM

## 2023-11-19 DIAGNOSIS — Z79899 Other long term (current) drug therapy: Secondary | ICD-10-CM

## 2023-11-19 DIAGNOSIS — Z9181 History of falling: Secondary | ICD-10-CM

## 2023-11-19 DIAGNOSIS — N1832 Chronic kidney disease, stage 3b: Secondary | ICD-10-CM | POA: Diagnosis not present

## 2023-11-19 DIAGNOSIS — E872 Acidosis, unspecified: Secondary | ICD-10-CM | POA: Diagnosis not present

## 2023-11-19 DIAGNOSIS — R9389 Abnormal findings on diagnostic imaging of other specified body structures: Secondary | ICD-10-CM | POA: Diagnosis not present

## 2023-11-19 DIAGNOSIS — I130 Hypertensive heart and chronic kidney disease with heart failure and stage 1 through stage 4 chronic kidney disease, or unspecified chronic kidney disease: Secondary | ICD-10-CM | POA: Diagnosis present

## 2023-11-19 DIAGNOSIS — E785 Hyperlipidemia, unspecified: Secondary | ICD-10-CM | POA: Diagnosis present

## 2023-11-19 DIAGNOSIS — Z860101 Personal history of adenomatous and serrated colon polyps: Secondary | ICD-10-CM

## 2023-11-19 DIAGNOSIS — I4891 Unspecified atrial fibrillation: Secondary | ICD-10-CM | POA: Diagnosis not present

## 2023-11-19 DIAGNOSIS — T182XXA Foreign body in stomach, initial encounter: Secondary | ICD-10-CM | POA: Diagnosis not present

## 2023-11-19 DIAGNOSIS — Z8673 Personal history of transient ischemic attack (TIA), and cerebral infarction without residual deficits: Secondary | ICD-10-CM | POA: Diagnosis not present

## 2023-11-19 DIAGNOSIS — Z888 Allergy status to other drugs, medicaments and biological substances status: Secondary | ICD-10-CM

## 2023-11-19 DIAGNOSIS — Z951 Presence of aortocoronary bypass graft: Secondary | ICD-10-CM

## 2023-11-19 DIAGNOSIS — R296 Repeated falls: Secondary | ICD-10-CM | POA: Diagnosis present

## 2023-11-19 DIAGNOSIS — Z23 Encounter for immunization: Secondary | ICD-10-CM | POA: Diagnosis not present

## 2023-11-19 DIAGNOSIS — K219 Gastro-esophageal reflux disease without esophagitis: Secondary | ICD-10-CM | POA: Diagnosis not present

## 2023-11-19 DIAGNOSIS — R4182 Altered mental status, unspecified: Secondary | ICD-10-CM | POA: Diagnosis not present

## 2023-11-19 DIAGNOSIS — Z832 Family history of diseases of the blood and blood-forming organs and certain disorders involving the immune mechanism: Secondary | ICD-10-CM

## 2023-11-19 LAB — I-STAT CHEM 8, ED
BUN: 25 mg/dL — ABNORMAL HIGH (ref 8–23)
Calcium, Ion: 1.14 mmol/L — ABNORMAL LOW (ref 1.15–1.40)
Chloride: 99 mmol/L (ref 98–111)
Creatinine, Ser: 1.3 mg/dL — ABNORMAL HIGH (ref 0.61–1.24)
Glucose, Bld: 150 mg/dL — ABNORMAL HIGH (ref 70–99)
HCT: 42 % (ref 39.0–52.0)
Hemoglobin: 14.3 g/dL (ref 13.0–17.0)
Potassium: 4.5 mmol/L (ref 3.5–5.1)
Sodium: 137 mmol/L (ref 135–145)
TCO2: 26 mmol/L (ref 22–32)

## 2023-11-19 LAB — CBC
HCT: 42.7 % (ref 39.0–52.0)
Hemoglobin: 14 g/dL (ref 13.0–17.0)
MCH: 32.5 pg (ref 26.0–34.0)
MCHC: 32.8 g/dL (ref 30.0–36.0)
MCV: 99.1 fL (ref 80.0–100.0)
Platelets: 269 K/uL (ref 150–400)
RBC: 4.31 MIL/uL (ref 4.22–5.81)
RDW: 13.6 % (ref 11.5–15.5)
WBC: 11.5 K/uL — ABNORMAL HIGH (ref 4.0–10.5)
nRBC: 0 % (ref 0.0–0.2)

## 2023-11-19 LAB — POC OCCULT BLOOD, ED: Fecal Occult Bld: NEGATIVE

## 2023-11-19 LAB — I-STAT CG4 LACTIC ACID, ED: Lactic Acid, Venous: 3.4 mmol/L (ref 0.5–1.9)

## 2023-11-19 LAB — PROTIME-INR
INR: 1.2 (ref 0.8–1.2)
Prothrombin Time: 15.6 s — ABNORMAL HIGH (ref 11.4–15.2)

## 2023-11-19 MED ORDER — ONDANSETRON HCL 4 MG/2ML IJ SOLN
4.0000 mg | Freq: Once | INTRAMUSCULAR | Status: AC
  Administered 2023-11-19: 4 mg via INTRAVENOUS
  Filled 2023-11-19: qty 2

## 2023-11-19 MED ORDER — TRANEXAMIC ACID FOR EPISTAXIS
500.0000 mg | Freq: Once | TOPICAL | Status: AC
  Administered 2023-11-20: 500 mg via TOPICAL
  Filled 2023-11-19: qty 10

## 2023-11-19 MED ORDER — SODIUM CHLORIDE 0.9 % IV SOLN
50.0000 ug/h | INTRAVENOUS | Status: DC
  Administered 2023-11-20: 50 ug/h via INTRAVENOUS
  Filled 2023-11-19: qty 1

## 2023-11-19 MED ORDER — OCTREOTIDE LOAD VIA INFUSION
50.0000 ug | Freq: Once | INTRAVENOUS | Status: AC
  Administered 2023-11-20: 50 ug via INTRAVENOUS
  Filled 2023-11-19: qty 25

## 2023-11-19 MED ORDER — LIDOCAINE-EPINEPHRINE 2 %-1:100000 IJ SOLN
20.0000 mL | Freq: Once | INTRAMUSCULAR | Status: AC
  Administered 2023-11-19: 20 mL

## 2023-11-19 MED ORDER — LIDOCAINE-EPINEPHRINE (PF) 2 %-1:200000 IJ SOLN
INTRAMUSCULAR | Status: AC
  Filled 2023-11-19: qty 20

## 2023-11-19 MED ORDER — PANTOPRAZOLE SODIUM 40 MG IV SOLR
40.0000 mg | Freq: Once | INTRAVENOUS | Status: AC
  Administered 2023-11-19: 40 mg via INTRAVENOUS
  Filled 2023-11-19: qty 10

## 2023-11-19 MED ORDER — LACTATED RINGERS IV BOLUS
1000.0000 mL | Freq: Once | INTRAVENOUS | Status: AC
  Administered 2023-11-19: 1000 mL via INTRAVENOUS

## 2023-11-19 NOTE — ED Notes (Addendum)
 MD Rancour at bedside to staple patient's head before CT d/t bleeding

## 2023-11-19 NOTE — ED Provider Notes (Signed)
 [facility] AT [facility] Provider Note   CSN: [id number] Arrival date & time: 11/19/23  2306     Patient presents with: Fall and Hematemesis   [name] is a 89 y.o. male.  {Add pertinent medical, surgical, social history, OB history to YEP:67052} Patient presents by EMS with unwitnessed fall.  Found on the ground for unknown period of time with head injury as well as dark appearing emesis.  Does take Eliquis .  Recent admission for altered mental status and sepsis from UTI.  He does not recall falling.  Not know what happened.  He is oriented to person only.  Denies any pain.  EMS reports dried blood at the scene as well as dark appearing emesis.  Unknown how long he was on the ground.  Obvious head injury.  No other obvious injuries.  He denies any head, neck, back, chest or abdominal pain.  EMS shows bag full of dark appearing emesis that appears bloody.  Does take Eliquis .  Hypertensive on arrival.  Heart rate 70s.  No history of GI bleeding previously per his report.  The history is provided by the patient.  Fall       Prior to Admission medications   Medication Sig Start Date End Date Taking? Authorizing Provider  acebutolol  (SECTRAL ) 200 MG capsule Take 1 capsule (200 mg total) by mouth 2 (two) times daily. 02/05/23   Ladona Heinz, MD  apixaban  (ELIQUIS ) 5 MG TABS tablet Take 1 tablet (5 mg total) by mouth 2 (two) times daily. 10/18/23   Raulkar, Sven SQUIBB, MD  atorvastatin  (LIPITOR ) 80 MG tablet Take 1 tablet (80 mg total) by mouth daily. Patient taking differently: Take 80 mg by mouth at bedtime. 10/12/22   Angiulli, Toribio PARAS, PA-C  buPROPion  (WELLBUTRIN  XL) 150 MG 24 hr tablet Take 150 mg by mouth every morning.    [provider]  empagliflozin  (JARDIANCE ) 25 MG TABS tablet Take 1 tablet (25 mg total) by mouth daily. Patient taking differently: Take 25 mg by mouth at bedtime. 10/12/22   Angiulli, Toribio PARAS, PA-C  escitalopram  (LEXAPRO )  10 MG tablet Take 10 mg by mouth daily. 11/21/22   [provider]  ezetimibe  (ZETIA ) 10 MG tablet Take 10 mg by mouth daily. 11/21/22   [provider]  furosemide  (LASIX ) 40 MG tablet Take 1 tablet (40 mg total) by mouth daily as needed for edema. 12/28/22   Ladona Heinz, MD  metFORMIN  (GLUCOPHAGE ) 1000 MG tablet Take 1,000 mg by mouth 2 (two) times daily with a meal.    [provider]  mirabegron  ER (MYRBETRIQ ) 50 MG TB24 tablet Take 1 tablet (50 mg total) by mouth daily. Patient not taking: Reported on 11/06/2023 10/12/22   Angiulli, Toribio PARAS, PA-C  olmesartan  (BENICAR ) 20 MG tablet Take 1 tablet (20 mg total) by mouth daily. 10/12/22   Angiulli, Toribio PARAS, PA-C  tadalafil  (CIALIS ) 5 MG tablet Take 1 tablet (5 mg total) by mouth daily. Patient taking differently: Take 5 mg by mouth at bedtime. 10/12/22   Angiulli, Toribio PARAS, PA-C  tamsulosin  (FLOMAX ) 0.4 MG CAPS capsule Take 1 capsule (0.4 mg total) by mouth daily after supper. Patient taking differently: Take 0.4 mg by mouth at bedtime. 10/12/22   Angiulli, Toribio PARAS, PA-C  Vitamin D , Ergocalciferol , (DRISDOL ) 1.25 MG (50000 UNIT) CAPS capsule Take 1 capsule (50,000 Units total) by mouth every 7 (seven) days. Patient not taking: Reported on 11/06/2023 12/13/22   Lorilee Sven SQUIBB,  MD    Allergies: Ketorolac tromethamine    Review of Systems  Unable to perform ROS: Mental status change    Updated Vital Signs BP (!) 180/86   Wt 76 kg   BMI 24.74 kg/m   Physical Exam Constitutional:      General: He is in acute distress.     Appearance: Normal appearance. He is ill-appearing.     Comments: Chronic ill-appearing oriented x 1  HENT:     Head: Normocephalic.     Comments: Hematoma right occiput with approximately 4 cm superficial vertical laceration, hemostatic    Ears:     Comments: No septal hematoma    Mouth/Throat:     Mouth: Mucous membranes are moist.  Eyes:     Extraocular Movements: Extraocular movements  intact.     Pupils: Pupils are equal, round, and reactive to light.  Neck:     Comments: C-collar placed on arrival Cardiovascular:     Rate and Rhythm: Normal rate and regular rhythm.  Abdominal:     Tenderness: There is no abdominal tenderness.  Genitourinary:    Comments: No gross blood Musculoskeletal:        General: No swelling or tenderness. Normal range of motion.     Comments: Pelvis stable  Neurological:     Mental Status: He is alert.     Comments: Equal grip strengths, able to wiggle toes bilaterally.  Moving all extremities appropriately to command.  No facial droop.     (all labs ordered are listed, but only abnormal results are displayed) Labs Reviewed  COMPREHENSIVE METABOLIC PANEL WITH GFR  CBC  ETHANOL  URINALYSIS, ROUTINE W REFLEX MICROSCOPIC  PROTIME-INR  CK  I-STAT CHEM 8, ED  I-STAT CG4 LACTIC ACID, ED  SAMPLE TO BLOOD BANK  TYPE AND SCREEN  TROPONIN I (HIGH SENSITIVITY)    EKG: None  Radiology: No results found.  {Document cardiac monitor, telemetry assessment procedure when appropriate:32947} .Laceration Repair  Date/Time: 11/19/2023 11:42 PM  Performed by: Carita Senior, MD Authorized by: Carita Senior, MD   Consent:    Consent obtained:  Verbal and emergent situation   Consent given by:  Patient   Risks, benefits, and alternatives were discussed: yes     Risks discussed:  Need for additional repair, infection, pain, tendon damage, retained foreign body, vascular damage, poor wound healing and poor cosmetic result   Alternatives discussed:  No treatment Universal protocol:    Relevant documents present and verified: yes     Site/side marked: yes     Immediately prior to procedure, a time out was called: yes     Patient identity confirmed:  Verbally with patient Anesthesia:    Anesthesia method:  Local infiltration   Local anesthetic:  Lidocaine  2% WITH epi Laceration details:    Location:  Scalp   Scalp location:  Occipital    Length (cm):  8 Pre-procedure details:    Preparation:  Patient was prepped and draped in usual sterile fashion Exploration:    Hemostasis achieved with:  Epinephrine  and direct pressure   Imaging outcome: foreign body not noted     Wound exploration: wound explored through full range of motion and entire depth of wound visualized     Wound extent: fascia violated     Wound extent: no underlying fracture     Contaminated: no   Treatment:    Area cleansed with:  Povidone-iodine   Amount of cleaning:  Standard   Irrigation solution:  Sterile  saline   Irrigation method:  Pressure wash   Visualized foreign bodies/material removed: no     Debridement:  Moderate Skin repair:    Repair method:  Staples   Number of staples:  15 Approximation:    Approximation:  Close Repair type:    Repair type:  Complex Post-procedure details:    Dressing:  Antibiotic ointment and adhesive bandage   Procedure completion:  Tolerated    Medications Ordered in the ED  lactated ringers  bolus 1,000 mL (has no administration in time range)  pantoprazole  (PROTONIX ) injection 40 mg (has no administration in time range)      {Click here for ABCD2, HEART and other calculators REFRESH Note before signing:1}                              Medical Decision Making Amount and/or Complexity of Data Reviewed Independent Historian: EMS Labs: ordered. Decision-making details documented in ED Course. Radiology: ordered and independent interpretation performed. Decision-making details documented in ED Course. ECG/medicine tests: ordered and independent interpretation performed. Decision-making details documented in ED Course.  Risk Prescription drug management.   Patient found down with obvious head injury and concern for hematemesis.  GCS is 14, ABCs are intact.  Blood pressure elevated on arrival.  Does take Eliquis .  Recent admission for Klebsiella UTI.  Patient awake and alert.  Blood pressure 170/95, pulse  76.  Lactate 3.4.  I-STAT creatinine 1.3.  Will send for CT trauma imaging.  {Document critical care time when appropriate  Document review of labs and clinical decision tools ie CHADS2VASC2, etc  Document your independent review of radiology images and any outside records  Document your discussion with family members, caretakers and with consultants  Document social determinants of health affecting pt's care  Document your decision making why or why not admission, treatments were needed:32947:::1}   Final diagnoses:  None    ED Discharge Orders     None

## 2023-11-19 NOTE — ED Notes (Addendum)
 Patient to ct with this RN

## 2023-11-19 NOTE — ED Triage Notes (Signed)
 Patient BIB [facility] c/o fall with head injury.  Patient found by neighbor on ground, covered in dark red emesis.  EMS reports patient vomited dark red emesis en-route.  Patient arrives with lac to back of head, covered in dark red blood.   EMS Vitals 170/90 HR 76 CBG 117 18 L wrist 4 mg zofran 

## 2023-11-19 NOTE — Progress Notes (Deleted)
 [facility]  PATIENT: [name] DOB: 01-25-1935  REFERRING DOCTOR OR PCP:  *** SOURCE: ***  _________________________________   HISTORICAL  CHIEF COMPLAINT:  No chief complaint on file.   HISTORY OF PRESENT ILLNESS:  ***   He had a stroke since 10/01/2018 for: Diplopia and hemianopsia CT perfusion had shown a 19 mL penumbra with no core infarct and CT angiogram had shown intracranial stenosis.  He underwent mechanical thrombectomy for the left PCA occlusion with complete recanalization.  Images personally reviewed. CT scan of the head 11/05/2023 shows ventriculomegaly.  There is crowding of the sulci at the vertex and a decrease callosal angle often seen t with normal pressure hydrocephalus.  There are some generalized cortical atrophy.  There is white matter hypodense changes which could represent chronic microvascular ischemia, possibly intermixed with transependymal flow.  MRI 10/02/2022 showed ventriculomegaly with crowding of the sulci at the vertex and a reduced callosal angle.  Mild generalized cortical atrophy is noted.  This MRI also shows scattered small acute strokes in the cerebellum, occipital lobes and left lateral thalamus.  There is moderate chronic microvascular ischemic change and possibly mild transependymal flow.  CT angiogram 10/01/2022 showed occlusion of the proximal left P1 segment and moderate stenosis in the distal left P2 segment.  There is also severe stenosis in the left supraclinoid ICA and moderate stenosis in the right supraclinoid ICA.  Severe stenosis at the origin of the right PICA and mild stenosis in the basilar artery.   REVIEW OF SYSTEMS: Constitutional: No fevers, chills, sweats, or change in appetite Eyes: No visual changes, double vision, eye pain Ear, nose and throat: No hearing loss, ear pain, nasal congestion, sore throat Cardiovascular: No chest pain, palpitations Respiratory:  No shortness of breath at rest or  with exertion.   No wheezes GastrointestinaI: No nausea, vomiting, diarrhea, abdominal pain, fecal incontinence Genitourinary:  No dysuria, urinary retention or frequency.  No nocturia. Musculoskeletal:  No neck pain, back pain Integumentary: No rash, pruritus, skin lesions Neurological: as above Psychiatric: No depression at this time.  No anxiety Endocrine: No palpitations, diaphoresis, change in appetite, change in weigh or increased thirst Hematologic/Lymphatic:  No anemia, purpura, petechiae. Allergic/Immunologic: No itchy/runny eyes, nasal congestion, recent allergic reactions, rashes  ALLERGIES: Allergies  Allergen Reactions   Ketorolac Tromethamine Other (See Comments)    nephrotic syndrome    HOME MEDICATIONS:  Current Outpatient Medications:    acebutolol  (SECTRAL ) 200 MG capsule, Take 1 capsule (200 mg total) by mouth 2 (two) times daily., Disp: 180 capsule, Rfl: 1   apixaban  (ELIQUIS ) 5 MG TABS tablet, Take 1 tablet (5 mg total) by mouth 2 (two) times daily., Disp: 60 tablet, Rfl: 0   atorvastatin  (LIPITOR ) 80 MG tablet, Take 1 tablet (80 mg total) by mouth daily. (Patient taking differently: Take 80 mg by mouth at bedtime.), Disp: 30 tablet, Rfl: 0   buPROPion  (WELLBUTRIN  XL) 150 MG 24 hr tablet, Take 150 mg by mouth every morning., Disp: , Rfl:    empagliflozin  (JARDIANCE ) 25 MG TABS tablet, Take 1 tablet (25 mg total) by mouth daily. (Patient taking differently: Take 25 mg by mouth at bedtime.), Disp: 30 tablet, Rfl: 0   escitalopram  (LEXAPRO ) 10 MG tablet, Take 10 mg by mouth daily., Disp: , Rfl:    ezetimibe  (ZETIA ) 10 MG tablet, Take 10 mg by mouth daily., Disp: , Rfl:    furosemide  (LASIX ) 40 MG tablet, Take 1 tablet (40 mg total) by mouth daily as  needed for edema., Disp: , Rfl:    metFORMIN  (GLUCOPHAGE ) 1000 MG tablet, Take 1,000 mg by mouth 2 (two) times daily with a meal., Disp: , Rfl:    mirabegron  ER (MYRBETRIQ ) 50 MG TB24 tablet, Take 1 tablet (50 mg total) by  mouth daily. (Patient not taking: Reported on 11/06/2023), Disp: 30 tablet, Rfl: 0   olmesartan  (BENICAR ) 20 MG tablet, Take 1 tablet (20 mg total) by mouth daily., Disp: 30 tablet, Rfl: 0   tadalafil  (CIALIS ) 5 MG tablet, Take 1 tablet (5 mg total) by mouth daily. (Patient taking differently: Take 5 mg by mouth at bedtime.), Disp: 10 tablet, Rfl: 0   tamsulosin  (FLOMAX ) 0.4 MG CAPS capsule, Take 1 capsule (0.4 mg total) by mouth daily after supper. (Patient taking differently: Take 0.4 mg by mouth at bedtime.), Disp: 30 capsule, Rfl: 0   Vitamin D , Ergocalciferol , (DRISDOL ) 1.25 MG (50000 UNIT) CAPS capsule, Take 1 capsule (50,000 Units total) by mouth every 7 (seven) days. (Patient not taking: Reported on 11/06/2023), Disp: 7 capsule, Rfl: 0  PAST MEDICAL HISTORY: Past Medical History:  Diagnosis Date   Acute renal failure (HCC)    secondary to nephrotic syndrome post cath, resolved with steroids   Anxiety    Arthritis    CAD (coronary artery disease) 04/2006   LIMA to LAD, sequential SVG to first, second, and third OM's, sequential SVG to mid RCA and PDA    Depression    Diabetes mellitus without complication (HCC)    ED (erectile dysfunction)    HTN (hypertension)    Hx of adenomatous colonic polyps    Hyperlipidemia    Loop recorder: Biotronic Biomonitor II loop 07/28/2021 07/28/2021   Obesity    Stroke [facility])    june    PAST SURGICAL HISTORY: Past Surgical History:  Procedure Laterality Date   COLONOSCOPY W/ BIOPSIES AND POLYPECTOMY     CORONARY ARTERY BYPASS GRAFT  2008   VESSELS X6   IR CT HEAD LTD  10/02/2022   IR PERCUTANEOUS ART THROMBECTOMY/INFUSION INTRACRANIAL INC DIAG ANGIO  10/02/2022   IR US  GUIDE VASC ACCESS RIGHT  10/02/2022   LOOP RECORDER IMPLANT     RADIOLOGY WITH ANESTHESIA N/A 10/02/2022   Procedure: IR WITH ANESTHESIA;  Surgeon: Radiologist, Medication, MD;  Location: [facility] OR;  Service: Radiology;  Laterality: N/A;   UMBILICAL HERNIA REPAIR      FAMILY  HISTORY: Family History  Problem Relation Age of Onset   Heart attack Mother    Clotting disorder Father        died of renal disease   Heart disease Sister    Cancer Sister    Colon cancer Neg Hx     SOCIAL HISTORY: Social History   Socioeconomic History   Marital status: Widowed    Spouse name: Not on file   Number of children: Not on file   Years of education: Not on file   Highest education level: Not on file  Occupational History   Occupation: Field seismologist  Tobacco Use   Smoking status: Never   Smokeless tobacco: Never  Vaping Use   Vaping status: Never Used  Substance and Sexual Activity   Alcohol  use: No    Alcohol /week: 1.0 standard drink of alcohol     Types: 1 Glasses of wine per week   Drug use: No   Sexual activity: Not on file  Other Topics Concern   Not on file  Social History Narrative   Retired Surveyor, minerals.  Widowed  since 2014.  He has multiple children.  Never smoker 5 caffeinated beverages daily and no alcohol    Social Drivers of Corporate investment banker Strain: Not on file  Food Insecurity: Not on file  Transportation Needs: Not on file  Physical Activity: Not on file  Stress: Not on file  Social Connections: Not on file  Intimate Partner Violence: Not on file       PHYSICAL EXAM  There were no vitals filed for this visit.  There is no height or weight on file to calculate BMI.   General: The patient is well-developed and well-nourished and in no acute distress  HEENT:  Head is [place]/AT.  Sclera are anicteric.  Funduscopic exam shows normal optic discs and retinal vessels.  Neck: No carotid bruits are noted.  The neck is nontender.  Cardiovascular: The heart has a regular rate and rhythm with a normal S1 and S2. There were no murmurs, gallops or rubs.    Skin: Extremities are without rash or  edema.  Musculoskeletal:  Back is nontender  Neurologic Exam  Mental status: The patient is alert and oriented x 3 at the time  of the examination. The patient has apparent normal recent and remote memory, with an apparently normal attention span and concentration ability.   Speech is normal.  Cranial nerves: Extraocular movements are full. Pupils are equal, round, and reactive to light and accomodation.  Visual fields are full.  Facial symmetry is present. There is good facial sensation to soft touch bilaterally.Facial strength is normal.  Trapezius and sternocleidomastoid strength is normal. No dysarthria is noted.  The tongue is midline, and the patient has symmetric elevation of the soft palate. No obvious hearing deficits are noted.  Motor:  Muscle bulk is normal.   Tone is normal. Strength is  5 / 5 in all 4 extremities.   Sensory: Sensory testing is intact to pinprick, soft touch and vibration sensation in all 4 extremities.  Coordination: Cerebellar testing reveals good finger-nose-finger and heel-to-shin bilaterally.  Gait and station: Station is normal.   Gait is normal. Tandem gait is normal. Romberg is negative.   Reflexes: Deep tendon reflexes are symmetric and normal bilaterally.   Plantar responses are flexor.    DIAGNOSTIC DATA (LABS, IMAGING, TESTING) - I reviewed patient records, labs, notes, testing and imaging myself where available.  Lab Results  Component Value Date   WBC 12.9 (H) 11/08/2023   HGB 13.1 11/08/2023   HCT 40.0 11/08/2023   MCV 98.0 11/08/2023   PLT 157 11/08/2023      Component Value Date/Time   NA 136 11/08/2023 0552   NA 139 12/07/2022 1116   K 3.9 11/08/2023 0552   CL 101 11/08/2023 0552   CO2 25 11/08/2023 0552   GLUCOSE 124 (H) 11/08/2023 0552   BUN 25 (H) 11/08/2023 0552   BUN 42 (H) 12/07/2022 1116   CREATININE 1.16 11/08/2023 0552   CALCIUM  8.7 (L) 11/08/2023 0552   PROT 6.9 11/05/2023 1844   ALBUMIN 3.7 11/05/2023 1844   AST 34 11/05/2023 1844   ALT 26 11/05/2023 1844   ALKPHOS 94 11/05/2023 1844   BILITOT 1.0 11/05/2023 1844   GFRNONAA >60 11/08/2023  0552   GFRAA  10/11/2009 0852    >60        The eGFR has been calculated using the MDRD equation. This calculation has not been validated in all clinical situations. eGFR's persistently <60 mL/min signify possible Chronic Kidney Disease.   Lab  Results  Component Value Date   CHOL 136 10/02/2022   HDL 45 10/02/2022   LDLCALC 77 10/02/2022   TRIG 68 10/02/2022   CHOLHDL 3.0 10/02/2022   Lab Results  Component Value Date   HGBA1C 8.4 (H) 10/02/2022   Lab Results  Component Value Date   VITAMINB12 393 12/12/2022   No results found for: TSH     ASSESSMENT AND PLAN  ***   Neelah Mannings A. Vear, MD, [facility] 11/19/2023, 1:41 PM Certified in Neurology, Clinical Neurophysiology, Sleep Medicine and Neuroimaging  [facility] [address] [place], [place] [address] [phone]

## 2023-11-20 ENCOUNTER — Ambulatory Visit: Admitting: Neurology

## 2023-11-20 ENCOUNTER — Encounter (HOSPITAL_COMMUNITY)
Admission: EM | Disposition: A | Discharge status: Home Health | Payer: Self-pay | Source: Home / Self Care | Attending: Internal Medicine

## 2023-11-20 ENCOUNTER — Telehealth: Payer: Self-pay | Admitting: Neurology

## 2023-11-20 ENCOUNTER — Encounter (HOSPITAL_COMMUNITY): Payer: Self-pay | Admitting: Internal Medicine

## 2023-11-20 ENCOUNTER — Inpatient Hospital Stay (HOSPITAL_COMMUNITY): Admitting: Anesthesiology

## 2023-11-20 DIAGNOSIS — Z23 Encounter for immunization: Secondary | ICD-10-CM | POA: Diagnosis not present

## 2023-11-20 DIAGNOSIS — N400 Enlarged prostate without lower urinary tract symptoms: Secondary | ICD-10-CM

## 2023-11-20 DIAGNOSIS — I679 Cerebrovascular disease, unspecified: Secondary | ICD-10-CM | POA: Diagnosis not present

## 2023-11-20 DIAGNOSIS — I6782 Cerebral ischemia: Secondary | ICD-10-CM | POA: Diagnosis not present

## 2023-11-20 DIAGNOSIS — I480 Paroxysmal atrial fibrillation: Secondary | ICD-10-CM | POA: Diagnosis not present

## 2023-11-20 DIAGNOSIS — S0990XA Unspecified injury of head, initial encounter: Secondary | ICD-10-CM | POA: Diagnosis not present

## 2023-11-20 DIAGNOSIS — E785 Hyperlipidemia, unspecified: Secondary | ICD-10-CM | POA: Diagnosis not present

## 2023-11-20 DIAGNOSIS — W19XXXA Unspecified fall, initial encounter: Secondary | ICD-10-CM | POA: Diagnosis present

## 2023-11-20 DIAGNOSIS — N1832 Chronic kidney disease, stage 3b: Secondary | ICD-10-CM | POA: Insufficient documentation

## 2023-11-20 DIAGNOSIS — Z8711 Personal history of peptic ulcer disease: Secondary | ICD-10-CM

## 2023-11-20 DIAGNOSIS — K920 Hematemesis: Secondary | ICD-10-CM

## 2023-11-20 DIAGNOSIS — Z8673 Personal history of transient ischemic attack (TIA), and cerebral infarction without residual deficits: Secondary | ICD-10-CM

## 2023-11-20 DIAGNOSIS — I2510 Atherosclerotic heart disease of native coronary artery without angina pectoris: Secondary | ICD-10-CM

## 2023-11-20 DIAGNOSIS — K2100 Gastro-esophageal reflux disease with esophagitis, without bleeding: Secondary | ICD-10-CM | POA: Diagnosis not present

## 2023-11-20 DIAGNOSIS — S0101XA Laceration without foreign body of scalp, initial encounter: Secondary | ICD-10-CM | POA: Diagnosis not present

## 2023-11-20 DIAGNOSIS — S299XXA Unspecified injury of thorax, initial encounter: Secondary | ICD-10-CM | POA: Diagnosis not present

## 2023-11-20 DIAGNOSIS — F411 Generalized anxiety disorder: Secondary | ICD-10-CM | POA: Insufficient documentation

## 2023-11-20 DIAGNOSIS — D6832 Hemorrhagic disorder due to extrinsic circulating anticoagulants: Secondary | ICD-10-CM | POA: Diagnosis not present

## 2023-11-20 DIAGNOSIS — K229 Disease of esophagus, unspecified: Secondary | ICD-10-CM | POA: Diagnosis not present

## 2023-11-20 DIAGNOSIS — S3991XA Unspecified injury of abdomen, initial encounter: Secondary | ICD-10-CM | POA: Diagnosis not present

## 2023-11-20 DIAGNOSIS — I10 Essential (primary) hypertension: Secondary | ICD-10-CM

## 2023-11-20 DIAGNOSIS — K219 Gastro-esophageal reflux disease without esophagitis: Secondary | ICD-10-CM

## 2023-11-20 DIAGNOSIS — F32A Depression, unspecified: Secondary | ICD-10-CM | POA: Diagnosis not present

## 2023-11-20 DIAGNOSIS — T182XXA Foreign body in stomach, initial encounter: Secondary | ICD-10-CM | POA: Diagnosis not present

## 2023-11-20 DIAGNOSIS — Y92009 Unspecified place in unspecified non-institutional (private) residence as the place of occurrence of the external cause: Secondary | ICD-10-CM

## 2023-11-20 DIAGNOSIS — K449 Diaphragmatic hernia without obstruction or gangrene: Secondary | ICD-10-CM | POA: Diagnosis not present

## 2023-11-20 DIAGNOSIS — R4182 Altered mental status, unspecified: Secondary | ICD-10-CM | POA: Diagnosis not present

## 2023-11-20 DIAGNOSIS — E875 Hyperkalemia: Secondary | ICD-10-CM | POA: Diagnosis not present

## 2023-11-20 DIAGNOSIS — E119 Type 2 diabetes mellitus without complications: Secondary | ICD-10-CM | POA: Diagnosis not present

## 2023-11-20 DIAGNOSIS — M4856XA Collapsed vertebra, not elsewhere classified, lumbar region, initial encounter for fracture: Secondary | ICD-10-CM | POA: Diagnosis not present

## 2023-11-20 DIAGNOSIS — Z043 Encounter for examination and observation following other accident: Secondary | ICD-10-CM | POA: Diagnosis not present

## 2023-11-20 DIAGNOSIS — E1122 Type 2 diabetes mellitus with diabetic chronic kidney disease: Secondary | ICD-10-CM | POA: Diagnosis not present

## 2023-11-20 DIAGNOSIS — K3189 Other diseases of stomach and duodenum: Secondary | ICD-10-CM | POA: Diagnosis not present

## 2023-11-20 DIAGNOSIS — K259 Gastric ulcer, unspecified as acute or chronic, without hemorrhage or perforation: Secondary | ICD-10-CM | POA: Diagnosis not present

## 2023-11-20 DIAGNOSIS — S3993XA Unspecified injury of pelvis, initial encounter: Secondary | ICD-10-CM | POA: Diagnosis not present

## 2023-11-20 DIAGNOSIS — R0989 Other specified symptoms and signs involving the circulatory and respiratory systems: Secondary | ICD-10-CM | POA: Diagnosis not present

## 2023-11-20 DIAGNOSIS — R9389 Abnormal findings on diagnostic imaging of other specified body structures: Secondary | ICD-10-CM | POA: Diagnosis not present

## 2023-11-20 DIAGNOSIS — S199XXA Unspecified injury of neck, initial encounter: Secondary | ICD-10-CM | POA: Diagnosis not present

## 2023-11-20 DIAGNOSIS — K5900 Constipation, unspecified: Secondary | ICD-10-CM | POA: Diagnosis not present

## 2023-11-20 DIAGNOSIS — G3184 Mild cognitive impairment, so stated: Secondary | ICD-10-CM

## 2023-11-20 DIAGNOSIS — Z7901 Long term (current) use of anticoagulants: Secondary | ICD-10-CM

## 2023-11-20 DIAGNOSIS — E872 Acidosis, unspecified: Secondary | ICD-10-CM | POA: Diagnosis not present

## 2023-11-20 DIAGNOSIS — Z8679 Personal history of other diseases of the circulatory system: Secondary | ICD-10-CM

## 2023-11-20 DIAGNOSIS — I5032 Chronic diastolic (congestive) heart failure: Secondary | ICD-10-CM | POA: Diagnosis not present

## 2023-11-20 DIAGNOSIS — D62 Acute posthemorrhagic anemia: Secondary | ICD-10-CM | POA: Diagnosis not present

## 2023-11-20 DIAGNOSIS — F418 Other specified anxiety disorders: Secondary | ICD-10-CM

## 2023-11-20 DIAGNOSIS — R55 Syncope and collapse: Secondary | ICD-10-CM | POA: Diagnosis not present

## 2023-11-20 DIAGNOSIS — I4891 Unspecified atrial fibrillation: Secondary | ICD-10-CM | POA: Diagnosis not present

## 2023-11-20 DIAGNOSIS — I130 Hypertensive heart and chronic kidney disease with heart failure and stage 1 through stage 4 chronic kidney disease, or unspecified chronic kidney disease: Secondary | ICD-10-CM | POA: Diagnosis not present

## 2023-11-20 DIAGNOSIS — K226 Gastro-esophageal laceration-hemorrhage syndrome: Secondary | ICD-10-CM | POA: Diagnosis not present

## 2023-11-20 DIAGNOSIS — Z87898 Personal history of other specified conditions: Secondary | ICD-10-CM

## 2023-11-20 DIAGNOSIS — Z794 Long term (current) use of insulin: Secondary | ICD-10-CM | POA: Diagnosis not present

## 2023-11-20 DIAGNOSIS — E7849 Other hyperlipidemia: Secondary | ICD-10-CM

## 2023-11-20 DIAGNOSIS — K2970 Gastritis, unspecified, without bleeding: Secondary | ICD-10-CM | POA: Diagnosis not present

## 2023-11-20 DIAGNOSIS — K2270 Barrett's esophagus without dysplasia: Secondary | ICD-10-CM | POA: Diagnosis not present

## 2023-11-20 HISTORY — PX: ESOPHAGOGASTRODUODENOSCOPY: SHX5428

## 2023-11-20 LAB — CBC
HCT: 40.9 % (ref 39.0–52.0)
Hemoglobin: 13.4 g/dL (ref 13.0–17.0)
MCH: 32.7 pg (ref 26.0–34.0)
MCHC: 32.8 g/dL (ref 30.0–36.0)
MCV: 99.8 fL (ref 80.0–100.0)
Platelets: 298 K/uL (ref 150–400)
RBC: 4.1 MIL/uL — ABNORMAL LOW (ref 4.22–5.81)
RDW: 13.9 % (ref 11.5–15.5)
WBC: 19.2 K/uL — ABNORMAL HIGH (ref 4.0–10.5)
nRBC: 0 % (ref 0.0–0.2)

## 2023-11-20 LAB — COMPREHENSIVE METABOLIC PANEL WITH GFR
ALT: 29 U/L (ref 0–44)
ALT: 27 U/L (ref 0–44)
AST: 39 U/L (ref 15–41)
AST: 33 U/L (ref 15–41)
Albumin: 3 g/dL — ABNORMAL LOW (ref 3.5–5.0)
Albumin: 3 g/dL — ABNORMAL LOW (ref 3.5–5.0)
Alkaline Phosphatase: 77 U/L (ref 38–126)
Alkaline Phosphatase: 69 U/L (ref 38–126)
Anion gap: 11 (ref 5–15)
Anion gap: 9 (ref 5–15)
BUN: 23 mg/dL (ref 8–23)
BUN: 23 mg/dL (ref 8–23)
CO2: 26 mmol/L (ref 22–32)
CO2: 27 mmol/L (ref 22–32)
Calcium: 9.2 mg/dL (ref 8.9–10.3)
Calcium: 9.3 mg/dL (ref 8.9–10.3)
Chloride: 100 mmol/L (ref 98–111)
Chloride: 101 mmol/L (ref 98–111)
Creatinine, Ser: 1.29 mg/dL — ABNORMAL HIGH (ref 0.61–1.24)
Creatinine, Ser: 1.39 mg/dL — ABNORMAL HIGH (ref 0.61–1.24)
GFR, Estimated: 54 mL/min — ABNORMAL LOW
GFR, Estimated: 49 mL/min — ABNORMAL LOW
Glucose, Bld: 151 mg/dL — ABNORMAL HIGH (ref 70–99)
Glucose, Bld: 134 mg/dL — ABNORMAL HIGH (ref 70–99)
Potassium: 4.5 mmol/L (ref 3.5–5.1)
Potassium: 5.6 mmol/L — ABNORMAL HIGH (ref 3.5–5.1)
Sodium: 137 mmol/L (ref 135–145)
Sodium: 137 mmol/L (ref 135–145)
Total Bilirubin: 0.8 mg/dL (ref 0.0–1.2)
Total Bilirubin: 1.1 mg/dL (ref 0.0–1.2)
Total Protein: 6.4 g/dL — ABNORMAL LOW (ref 6.5–8.1)
Total Protein: 5.4 g/dL — ABNORMAL LOW (ref 6.5–8.1)

## 2023-11-20 LAB — LIPASE, BLOOD: Lipase: 28 U/L (ref 11–51)

## 2023-11-20 LAB — TYPE AND SCREEN

## 2023-11-20 LAB — URINALYSIS, ROUTINE W REFLEX MICROSCOPIC
Bacteria, UA: NONE SEEN
Bilirubin Urine: NEGATIVE
Glucose, UA: >=500 mg/dL — AB
Hgb urine dipstick: NEGATIVE
Ketones, ur: NEGATIVE mg/dL
Leukocytes,Ua: NEGATIVE
Nitrite: NEGATIVE
Protein, ur: 30 mg/dL — AB
Specific Gravity, Urine: 1.017 (ref 1.005–1.030)
pH: 8 (ref 5.0–8.0)

## 2023-11-20 LAB — CK: Total CK: 30 U/L — ABNORMAL LOW (ref 49–397)

## 2023-11-20 LAB — HEMOGLOBIN AND HEMATOCRIT, BLOOD
HCT: 40.1 % (ref 39.0–52.0)
HCT: 38.4 % — ABNORMAL LOW (ref 39.0–52.0)
Hemoglobin: 13.2 g/dL (ref 13.0–17.0)
Hemoglobin: 12.7 g/dL — ABNORMAL LOW (ref 13.0–17.0)

## 2023-11-20 LAB — TROPONIN I (HIGH SENSITIVITY)
Troponin I (High Sensitivity): 5 ng/L (ref <18)
Troponin I (High Sensitivity): 6 ng/L (ref <18)

## 2023-11-20 LAB — GLUCOSE, CAPILLARY: Glucose-Capillary: 110 mg/dL — ABNORMAL HIGH (ref 70–99)

## 2023-11-20 LAB — AMMONIA: Ammonia: 13 umol/L (ref 9–35)

## 2023-11-20 LAB — ETHANOL: Alcohol, Ethyl (B): <15 mg/dL

## 2023-11-20 LAB — I-STAT CG4 LACTIC ACID, ED: Lactic Acid, Venous: 1.3 mmol/L (ref 0.5–1.9)

## 2023-11-20 SURGERY — EGD (ESOPHAGOGASTRODUODENOSCOPY)
Anesthesia: Monitor Anesthesia Care

## 2023-11-20 MED ORDER — FENTANYL CITRATE PF 50 MCG/ML IJ SOSY
50.0000 ug | PREFILLED_SYRINGE | Freq: Once | INTRAMUSCULAR | Status: AC
  Administered 2023-11-20: 50 ug via INTRAVENOUS
  Filled 2023-11-20: qty 1

## 2023-11-20 MED ORDER — TETANUS-DIPHTH-ACELL PERTUSSIS 5-2.5-18.5 LF-MCG/0.5 IM SUSY
PREFILLED_SYRINGE | INTRAMUSCULAR | Status: AC
  Filled 2023-11-20: qty 0.5

## 2023-11-20 MED ORDER — MIRABEGRON ER 50 MG PO TB24: 50.0000 mg | ORAL_TABLET | Freq: Every day | ORAL | Status: DC

## 2023-11-20 MED ORDER — ATORVASTATIN CALCIUM 80 MG PO TABS
80.0000 mg | ORAL_TABLET | Freq: Every day | ORAL | Status: DC
  Administered 2023-11-21 – 2023-11-22 (×2): 80 mg via ORAL
  Filled 2023-11-20: qty 1
  Filled 2023-11-20 (×2): qty 2

## 2023-11-20 MED ORDER — ESCITALOPRAM OXALATE 10 MG PO TABS
10.0000 mg | ORAL_TABLET | Freq: Every day | ORAL | Status: DC
  Administered 2023-11-21 – 2023-11-22 (×2): 10 mg via ORAL
  Filled 2023-11-20 (×3): qty 1

## 2023-11-20 MED ORDER — ACETAMINOPHEN 325 MG PO TABS: 650.0000 mg | ORAL_TABLET | Freq: Four times a day (QID) | ORAL | Status: DC | PRN

## 2023-11-20 MED ORDER — FLEET ENEMA RE ENEM: 1.0000 | ENEMA | Freq: Once | RECTAL | Status: DC | PRN

## 2023-11-20 MED ORDER — IOHEXOL 350 MG/ML SOLN
100.0000 mL | Freq: Once | INTRAVENOUS | Status: AC | PRN
  Administered 2023-11-20: 100 mL via INTRAVENOUS

## 2023-11-20 MED ORDER — BUPROPION HCL ER (XL) 150 MG PO TB24
150.0000 mg | ORAL_TABLET | Freq: Every morning | ORAL | Status: DC
  Administered 2023-11-21 – 2023-11-22 (×2): 150 mg via ORAL
  Filled 2023-11-20 (×3): qty 1

## 2023-11-20 MED ORDER — LACTATED RINGERS IV SOLN: INTRAVENOUS | Status: AC

## 2023-11-20 MED ORDER — METFORMIN HCL 500 MG PO TABS: 1000.0000 mg | ORAL_TABLET | Freq: Two times a day (BID) | ORAL | Status: DC

## 2023-11-20 MED ORDER — EZETIMIBE 10 MG PO TABS
10.0000 mg | ORAL_TABLET | Freq: Every day | ORAL | Status: DC
  Administered 2023-11-21 – 2023-11-22 (×2): 10 mg via ORAL
  Filled 2023-11-20 (×2): qty 1

## 2023-11-20 MED ORDER — ACETAMINOPHEN 650 MG RE SUPP: 650.0000 mg | Freq: Four times a day (QID) | RECTAL | Status: DC | PRN

## 2023-11-20 MED ORDER — PANTOPRAZOLE SODIUM 40 MG IV SOLR
40.0000 mg | Freq: Two times a day (BID) | INTRAVENOUS | Status: DC
  Administered 2023-11-20 (×2): 40 mg via INTRAVENOUS
  Filled 2023-11-20 (×3): qty 10

## 2023-11-20 MED ORDER — FLEET ENEMA RE ENEM: 1.0000 | ENEMA | Freq: Once | RECTAL | Status: DC

## 2023-11-20 MED ORDER — IRBESARTAN 75 MG PO TABS: 37.5000 mg | ORAL_TABLET | Freq: Every day | ORAL | Status: DC

## 2023-11-20 MED ORDER — ACEBUTOLOL HCL 200 MG PO CAPS
200.0000 mg | ORAL_CAPSULE | Freq: Two times a day (BID) | ORAL | Status: DC
  Administered 2023-11-20 – 2023-11-22 (×3): 200 mg via ORAL
  Filled 2023-11-20 (×5): qty 1

## 2023-11-20 MED ORDER — SODIUM CHLORIDE 0.9% FLUSH: 3.0000 mL | INTRAVENOUS | Status: DC | PRN

## 2023-11-20 MED ORDER — ONDANSETRON HCL 4 MG/2ML IJ SOLN
4.0000 mg | Freq: Four times a day (QID) | INTRAMUSCULAR | Status: DC | PRN
  Administered 2023-11-20: 4 mg via INTRAVENOUS
  Filled 2023-11-20: qty 2

## 2023-11-20 MED ORDER — PROPOFOL 10 MG/ML IV BOLUS
INTRAVENOUS | Status: DC | PRN
  Administered 2023-11-20: 20 mg via INTRAVENOUS
  Administered 2023-11-20: 30 mg via INTRAVENOUS

## 2023-11-20 MED ORDER — SODIUM CHLORIDE 0.9 % IV SOLN
250.0000 mL | INTRAVENOUS | Status: AC | PRN
Start: 2023-11-20 — End: 2023-11-21

## 2023-11-20 MED ORDER — SODIUM CHLORIDE 0.9 % IV SOLN: INTRAVENOUS | Status: DC | PRN

## 2023-11-20 MED ORDER — PHENYLEPHRINE HCL-NACL 20-0.9 MG/250ML-% IV SOLN
INTRAVENOUS | Status: DC | PRN
Start: 2023-11-20 — End: 2023-11-20
  Administered 2023-11-20: 160 ug/min via INTRAVENOUS

## 2023-11-20 MED ORDER — AMLODIPINE BESYLATE 5 MG PO TABS
5.0000 mg | ORAL_TABLET | Freq: Every day | ORAL | Status: DC
  Administered 2023-11-21 – 2023-11-22 (×2): 5 mg via ORAL
  Filled 2023-11-20 (×3): qty 1

## 2023-11-20 MED ORDER — TETANUS-DIPHTH-ACELL PERTUSSIS 5-2.5-18.5 LF-MCG/0.5 IM SUSY
0.5000 mL | PREFILLED_SYRINGE | Freq: Once | INTRAMUSCULAR | Status: AC
  Administered 2023-11-20: 0.5 mL via INTRAMUSCULAR
  Filled 2023-11-20 (×2): qty 0.5

## 2023-11-20 MED ORDER — SODIUM ZIRCONIUM CYCLOSILICATE 10 G PO PACK
10.0000 g | PACK | Freq: Once | ORAL | Status: DC
  Filled 2023-11-20: qty 1

## 2023-11-20 MED ORDER — PROPOFOL 500 MG/50ML IV EMUL
INTRAVENOUS | Status: DC | PRN
  Administered 2023-11-20: 150 ug/kg/min via INTRAVENOUS

## 2023-11-20 MED ORDER — EMPAGLIFLOZIN 25 MG PO TABS
25.0000 mg | ORAL_TABLET | Freq: Every day | ORAL | Status: DC
  Administered 2023-11-21 – 2023-11-22 (×2): 25 mg via ORAL
  Filled 2023-11-20 (×2): qty 1

## 2023-11-20 MED ORDER — SODIUM CHLORIDE 0.9% FLUSH
3.0000 mL | Freq: Two times a day (BID) | INTRAVENOUS | Status: DC
  Administered 2023-11-20 – 2023-11-22 (×3): 3 mL via INTRAVENOUS

## 2023-11-20 MED ORDER — SODIUM CHLORIDE 0.9 % IV SOLN: INTRAVENOUS | Status: DC

## 2023-11-20 MED ORDER — DOCUSATE SODIUM 100 MG PO CAPS
100.0000 mg | ORAL_CAPSULE | Freq: Two times a day (BID) | ORAL | Status: DC
  Administered 2023-11-20 – 2023-11-21 (×3): 100 mg via ORAL
  Filled 2023-11-20 (×5): qty 1

## 2023-11-20 MED ORDER — ONDANSETRON HCL 4 MG PO TABS: 4.0000 mg | ORAL_TABLET | Freq: Four times a day (QID) | ORAL | Status: DC | PRN

## 2023-11-20 MED ORDER — SODIUM CHLORIDE 0.9% FLUSH
3.0000 mL | Freq: Two times a day (BID) | INTRAVENOUS | Status: DC
  Administered 2023-11-20 – 2023-11-21 (×3): 3 mL via INTRAVENOUS

## 2023-11-20 MED ORDER — POLYETHYLENE GLYCOL 3350 17 G PO PACK
17.0000 g | PACK | Freq: Two times a day (BID) | ORAL | Status: DC
  Administered 2023-11-20 – 2023-11-22 (×5): 17 g via ORAL
  Filled 2023-11-20 (×6): qty 1

## 2023-11-20 MED ORDER — TAMSULOSIN HCL 0.4 MG PO CAPS
0.4000 mg | ORAL_CAPSULE | Freq: Every day | ORAL | Status: DC
  Administered 2023-11-20 – 2023-11-21 (×2): 0.4 mg via ORAL
  Filled 2023-11-20 (×2): qty 1

## 2023-11-20 NOTE — Telephone Encounter (Signed)
 Pt daughter called to cancel appt . Pt was admitted into Hospital last night .  Appt cancelled

## 2023-11-20 NOTE — Evaluation (Signed)
 Physical Therapy Evaluation Patient Details Name: [name] MRN: [id number] DOB: 1934/07/08 Today's Date: 11/20/2023  History of Present Illness  Pt is an 89 y/o male presenting to the ER via EMS for unwitnessed fall.  This patient was found on the ground for an unknown period of time with injury to the back of his hed as well as dark appearing emesis,  PMH includes: ARF, CABG, anxiety, DM, HTN mild cognitive impairment.  Clinical Impression  Pt admitted with/for unwitnessed fall.  Work up continues, pt needing min to mod assist for basic mobility.  Pt currently limited functionally due to the problems listed. ( See problems list.)   Pt will benefit from PT to maximize function and safety in order to get ready for next venue listed below.  Patient will benefit from continued inpatient follow up therapy, <3 hours/day          If plan is discharge home, recommend the following: A little help with walking and/or transfers;A little help with bathing/dressing/bathroom;Assistance with cooking/housework;Assist for transportation;Direct supervision/assist for medications management;Direct supervision/assist for financial management   Can travel by private vehicle   No    Equipment Recommendations None recommended by PT  Recommendations for Other Services       Functional Status Assessment Patient has had a recent decline in their functional status and demonstrates the ability to make significant improvements in function in a reasonable and predictable amount of time.     Precautions / Restrictions Precautions Precautions: Fall Recall of Precautions/Restrictions: Impaired      Mobility  Bed Mobility Overal bed mobility: Needs Assistance Bed Mobility: Rolling, Sidelying to Sit, Sit to Sidelying Rolling: Mod assist Sidelying to sit: Min assist     Sit to sidelying: Mod assist General bed mobility comments: cues for sequencing, extra assist due to dizziness/nystagmus     Transfers Overall transfer level: Needs assistance   Transfers: Sit to/from Stand, Bed to chair/wheelchair/BSC Sit to Stand: Min assist, Mod assist          Lateral/Scoot Transfers: Min assist (sidestepping) General transfer comment: min to mod dependent on height of the surface and static vs dynamic    Ambulation/Gait               General Gait Details: sidestepping to [facility]  Stairs            Wheelchair Mobility     Tilt Bed    Modified Rankin (Stroke Patients Only)       Balance Overall balance assessment: Needs assistance Sitting-balance support: Single extremity supported, Bilateral upper extremity supported, Feet supported Sitting balance-Leahy Scale: Poor Sitting balance - Comments: dizziness due to possible BPPV with posterior bias   Standing balance support: Single extremity supported, Bilateral upper extremity supported Standing balance-Leahy Scale: Poor Standing balance comment: posterior bias                             Pertinent Vitals/Pain Pain Assessment Pain Assessment: Faces Faces Pain Scale: Hurts a little bit Pain Descriptors / Indicators: Grimacing Pain Intervention(s): Monitored during session    Home Living Family/patient expects to be discharged to:: Assisted living Living Arrangements: Alone     Home Access: Level entry       Home Layout: One level Home Equipment: Cane - single point;Shower seat;Rollator (4 wheels)      Prior Function Prior Level of Function : Patient poor historian/Family not available  Mobility Comments: pt reports uses RW to mobilize in the room, to BR.  Not sure about halls. ADLs Comments: assist for bathing/dressing     Extremity/Trunk Assessment   Upper Extremity Assessment Upper Extremity Assessment: Overall WFL for tasks assessed    Lower Extremity Assessment Lower Extremity Assessment: Generalized weakness;Overall [facility] for tasks assessed    Cervical /  Trunk Assessment Cervical / Trunk Assessment: Kyphotic  Communication   Communication Communication: No apparent difficulties    Cognition Arousal: Lethargic Behavior During Therapy: Flat affect   PT - Cognitive impairments: No family/caregiver present to determine baseline, Memory, Orientation   Orientation impairments: Situation, Time, Place                     Following commands: Impaired Following commands impaired: Follows one step commands with increased time     Cueing Cueing Techniques: Verbal cues, Tactile cues     General Comments General comments (skin integrity, edema, etc.): Not formally tested, but transition from L side to sitting EOB produced dizziness and nystagmus.    Exercises     Assessment/Plan    PT Assessment Patient needs continued PT services  PT Problem List Decreased strength;Decreased activity tolerance;Decreased balance;Decreased mobility;Decreased knowledge of use of DME       PT Treatment Interventions Therapeutic activities;Functional mobility training;Gait training;Balance training;Patient/family education;Therapeutic exercise    PT Goals (Current goals can be found in the Care Plan section)  Acute Rehab PT Goals Patient Stated Goal: none stated PT Goal Formulation: Patient unable to participate in goal setting Time For Goal Achievement: 12/04/23 Potential to Achieve Goals: Fair    Frequency Min 2X/week     Co-evaluation               AM-PAC PT 6 Clicks Mobility  Outcome Measure Help needed turning from your back to your side while in a flat bed without using bedrails?: A Little Help needed moving from lying on your back to sitting on the side of a flat bed without using bedrails?: A Lot Help needed moving to and from a bed to a chair (including a wheelchair)?: A Little Help needed standing up from a chair using your arms (e.g., wheelchair or bedside chair)?: A Lot Help needed to walk in hospital room?:  Total Help needed climbing 3-5 steps with a railing? : Total 6 Click Score: 12    End of Session   Activity Tolerance: Patient tolerated treatment well (limited by dizziness/vertigo) Patient left: in bed;with call bell/phone within reach;with bed alarm set Nurse Communication: Mobility status PT Visit Diagnosis: History of falling (Z91.81);Muscle weakness (generalized) (M62.81);Difficulty in walking, not elsewhere classified (R26.2);Unsteadiness on feet (R26.81);Pain Pain - part of body:  (head)    Time: 8262-8245 PT Time Calculation (min) (ACUTE ONLY): 17 min   Charges:   PT Evaluation $PT Eval Moderate Complexity: 1 Mod   PT General Charges $$ ACUTE PT VISIT: 1 Visit         11/20/2023  India HERO., PT Acute Rehabilitation Services [phone]  (office)  Vinie GAILS Halford Goetzke 11/20/2023, 6:49 PM

## 2023-11-20 NOTE — ED Notes (Signed)
 Patient is upset that I will not allow him to get up to bedside commode, due to report from previous RN that states when he attempted to get up before had a near syncopal episode, bedpan was offered he refused and had a BM in the bed, patient was cleaned .

## 2023-11-20 NOTE — Op Note (Signed)
 [facility] Patient Name: [name] Procedure Date : 11/20/2023 MRN: [id number] Attending MD: Gordy CHRISTELLA Starch , MD, [id number] Date of Birth: 09-22-1934 CSN: [id number] Age: 89 Admit Type: Inpatient Procedure:                Upper GI endoscopy Indications:              Hematemesis (hx of Barrett's metaplasia in                            esophageal ulcer seen in 2023 with h pylori                            negative gastropathy in 2023) Providers:                Gordy CHRISTELLA. Starch, MD, Randall Lines, RN, Lorrayne Kitty,                            Technician Referring MD:             [facility] Medicines:                Monitored Anesthesia Care Complications:            No immediate complications. Estimated Blood Loss:     Estimated blood loss: none. Procedure:                Pre-Anesthesia Assessment:                           - Prior to the procedure, a History and Physical                            was performed, and patient medications and                            allergies were reviewed. The patient's tolerance of                            previous anesthesia was also reviewed. The risks                            and benefits of the procedure and the sedation                            options and risks were discussed with the patient.                            All questions were answered, and informed consent                            was obtained. Prior Anticoagulants: The patient has                            taken Eliquis  (apixaban ), last dose was 1 day prior  to procedure. ASA Grade Assessment: III - A patient                            with severe systemic disease. After reviewing the                            risks and benefits, the patient was deemed in                            satisfactory condition to undergo the procedure.                           After obtaining informed consent, the endoscope was                             passed under direct vision. Throughout the                            procedure, the patient's blood pressure, pulse, and                            oxygen saturations were monitored continuously. The                            [id number]-[id number] ([id number] Olympus endoscope was introduced                            through the mouth, and advanced to the second part                            of duodenum. The upper GI endoscopy was                            accomplished without difficulty. The patient                            tolerated the procedure well. Scope In: Scope Out: Findings:      The examined esophagus was normal.      A medium amount of food (residue) was found in the gastric fundus, in       the gastric body and in the gastric antrum. This prevents adequate       visualization, but no evidence of active or recent bleeding. There was       edema and erythema in the pre-pyloric antrum without active bleeding.      The examined duodenum was normal. Impression:               - Normal esophagus.                           - A medium amount of food (residue) in the stomach                            precluding adequate visualization. Edema and  erythema in the pre-pyloric stomach.                           - Normal examined duodenum.                           - No specimens collected. Moderate Sedation:      N/A Recommendation:           - Return patient to hospital ward for ongoing care.                           - Clear liquid diet.                           - Continue present medications. BID PPI for now.                           - Repeat upper endoscopy given impaired                            visualization today, the need to resume DOAC and                            recent hematemesis. Trend Hgb. Hold Eliquis . Procedure Code(s):        --- Professional ---                           [phone], Esophagogastroduodenoscopy, flexible,                             transoral; diagnostic, including collection of                            specimen(s) by brushing or washing, when performed                            (separate procedure) Diagnosis Code(s):        --- Professional ---                           K92.0, Hematemesis CPT copyright 2022 American Medical Association. All rights reserved. The codes documented in this report are preliminary and upon coder review may  be revised to meet current compliance requirements. Gordy CHRISTELLA Starch, MD 11/20/2023 10:46:00 AM This report has been signed electronically. Number of Addenda: 0

## 2023-11-20 NOTE — Progress Notes (Signed)
 [facility] PROGRESS NOTE   [name] FMW:[id number] DOB: 06-Apr-1935 DOA: 11/19/2023  PCP: Loreli Elsie JONETTA Mickey., MD  Brief History: 89 y.o. male with medical history significant of paroxysmal atrial fibrillation on Eliquis , essential hypertension, hyperlipidemia, DM type II, CAD status post CABG, CVA, CKD 3B, BPH, hyperlipidemia, mild cognitive impairment, depression, anxiety, and chronic generalized weakness with physical debility presented to emergency department via EMS for unwitnessed fall.  Patient was found on the ground for unknown period of time with head injury as well as dark appearing emesis.  And route to ED patient has another episodes of emesis as well.  Previous EGD in 2023 showed esophageal and erosive gastropathy.  No varices were identified.  Due to concern for GI bleed patient was hospitalized for further management.     Consultants: Gastroenterology  Procedures: None yet    Subjective/Interval History: Patient denies any headache.  Denies any abdominal pain no nausea vomiting this morning.  Denies any alcohol  use.  No NSAID use.    Assessment/Plan:  Upper GI bleed/history of gastritis This is in the setting of anticoagulation. No varices noted on previous EGD done in 2023. Patient was witnessed to have hematemesis.  Hemoglobin did drop slightly but has been stable. Continue Protonix . Await GI input. Eliquis  is on hold.  Lactic acidosis Etiology unclear.  No evidence for infection currently.  CT abdomen did not show any evidence for mesenteric ischemia.  Abdomen is benign. WBC is noted to be significantly higher today.  Probably stress reaction.  Lactic acid level is normal this morning.  Continue to monitor.  Chronic kidney disease stage IIIb Renal function seems to be close to baseline.  Elevated potassium level noted this morning.  Not noted to be on potassium supplements.  Will give him a dose of Lokelma  and recheck labs in the morning.  Scalp  laceration and hematoma/mechanical fall Patient lives in an assisted living facility.  Clemens and noted to have laceration on the scalp.  This was stapled by EDP.  Tetanus vaccination status is unknown.  Will give him a dose.  Paroxysmal atrial fibrillation Noted to be on acebutolol .  Eliquis  on hold due to concern for GI bleed. Anticoagulation will need to be discussed with patient and family if he has had multiple falls. Last echocardiogram from June 2024 showed normal LVEF.  Right ventricular systolic function was mildly reduced at that time.  Essential hypertension Blood pressure in the hypertensive range.  Apart from acebutolol  patient is also on olmesartan  prior to admission.  Due to elevated creatinine and hyperkalemia we will hold off on the ARB.  Will use amlodipine .  Constipation CT scan suggested constipation.  Started on laxatives.  Coronary artery disease status post CABG Stable.  Denies any chest pain.  History of stroke Stable.  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  Metformin  on hold.  Jardiance  being continued.  History of memory impairment/generalized anxiety disorder and depression Continue bupropion .  Hyperlipidemia Continue statin.  History of BPH Continue Flomax .   DVT Prophylaxis: Eliquis  on hold.  SCDs Code Status: Full code Family Communication: Will update family members later today Disposition Plan: To be determined.  He is here from ALF.  Status is: Inpatient Remains inpatient appropriate because: GI bleed      Medications: Scheduled:  acebutolol   200 mg Oral BID   atorvastatin   80 mg Oral Daily   buPROPion   150 mg Oral q morning  docusate sodium   100 mg Oral BID   empagliflozin   25 mg Oral Daily   escitalopram   10 mg Oral Daily   ezetimibe   10 mg Oral Daily   lidocaine -EPINEPHrine        mirabegron  ER  50 mg Oral Daily   pantoprazole  (PROTONIX ) IV  40 mg Intravenous  Q12H   polyethylene glycol  17 g Oral BID   sodium chloride  flush  3 mL Intravenous Q12H   sodium chloride  flush  3 mL Intravenous Q12H   tamsulosin   0.4 mg Oral QPC supper   Continuous:  sodium chloride      lactated ringers  100 mL/hr at 11/20/23 0237   PRN:sodium chloride , acetaminophen  **OR** acetaminophen , lidocaine -EPINEPHrine , ondansetron  **OR** ondansetron  (ZOFRAN ) IV, sodium chloride  flush  Antibiotics: Anti-infectives (From admission, onward)    None       Objective:  Vital Signs  Vitals:   11/20/23 0330 11/20/23 0345 11/20/23 0442 11/20/23 0500  BP: (!) 159/91 (!) 170/92 (!) 141/85 (!) 141/85  Pulse: 78 60 73 73  Resp: (!) 22 18 18 18   Temp:   97.8 F (36.6 C)   TempSrc:   Oral   SpO2: 96% 96% 99% 97%  Weight:        Intake/Output Summary (Last 24 hours) at 11/20/2023 0835 Last data filed at 11/20/2023 0145 Gross per 24 hour  Intake 1062.08 ml  Output --  Net 1062.08 ml   Filed Weights   11/19/23 2308  Weight: 76 kg    General appearance: Awake alert.  In no distress Laceration noted in the posterior scalp.  Staples noted.  No active bleeding noted. Resp: Clear to auscultation bilaterally.  Normal effort Cardio: S1-S2 is normal regular.  No S3-S4.  No rubs murmurs or bruit GI: Abdomen is soft.  Nontender nondistended.  Bowel sounds are present normal.  No masses organomegaly Extremities: No edema.  Full range of motion of lower extremities. Neurologic:No focal neurological deficits.    Lab Results:  Data Reviewed: I have personally reviewed following labs and reports of the imaging studies  CBC: Recent Labs  Lab 11/19/23 2309 11/19/23 2321 11/20/23 0127 11/20/23 0528  WBC 11.5*  --   --  19.2*  HGB 14.0 14.3 13.2 13.4  HCT 42.7 42.0 40.1 40.9  MCV 99.1  --   --  99.8  PLT 269  --   --  298    Basic Metabolic Panel: Recent Labs  Lab 11/19/23 2309 11/19/23 2321 11/20/23 0528  NA 137 137 137  K 4.5 4.5 5.6*  CL 100 99 101  CO2 26   --  27  GLUCOSE 151* 150* 134*  BUN 23 25* 23  CREATININE 1.29* 1.30* 1.39*  CALCIUM  9.2  --  9.3    GFR: Estimated Creatinine Clearance: 37.4 mL/min (A) (by C-G formula based on SCr of 1.39 mg/dL (H)).  Liver Function Tests: Recent Labs  Lab 11/19/23 2309 11/20/23 0528  AST 39 33  ALT 29 27  ALKPHOS 77 69  BILITOT 0.8 1.1  PROT 6.4* 5.4*  ALBUMIN 3.0* 3.0*    Recent Labs  Lab 11/19/23 2309  LIPASE 28    Coagulation Profile: Recent Labs  Lab 11/19/23 2309  INR 1.2    Cardiac Enzymes: Recent Labs  Lab 11/19/23 2309  CKTOTAL 30*     Radiology Studies: CT ANGIO GI BLEED Result Date: 11/20/2023 EXAM: CTA ABDOMEN AND PELVIS WITH CONTRAST 11/20/2023 12:11:50 AM TECHNIQUE: CTA images of the abdomen and pelvis with intravenous  contrast. Three-dimensional MIP/volume rendered formations were performed. Automated exposure control, iterative reconstruction, and/or weight based adjustment of the mA/kV was utilized to reduce the radiation dose to as low as reasonably achievable. COMPARISON: None available. CLINICAL HISTORY: Mesenteric ischemia, acute. Table formatting from the original note was not included. Images from the original note were not included. Patient BIB [facility] c/o fall with head injury. Patient found by neighbor on ground, covered in dark red emesis. EMS reports patient vomited dark red emesis en-route. Patient arrives with lac to back of head, covered in dark red blood. FINDINGS: (Note: Venous images are in the jacket for the CT chest abdomen and pelvis performed for blunt polytrauma.) GI BLEED: No active extravasation of contrast within the GI tract. VASCULAR: Advanced mixed density atherosclerotic plaque in the aorta without hemodynamically significant stenosis. No aneurysm or dissection. Moderate-to-severe narrowing at the origin of the SMA. Atherosclerotic plaque causes additional multifocal areas of mild narrowing in the celiac axis, bilateral renal arteries and  iliac arteries. LOWER CHEST: Visualized portion of the lower chest demonstrates no acute abnormality. CT ABDOMEN/ PELVIS: HEPATOBILIARY: Cholelithiasis without evidence of acute cholecystitis. No acute traumatic injury. SPLEEN: Unremarkable. PANCREAS: Unremarkable. ADRENAL GLANDS: Unremarkable. KIDNEYS: No nephrolithiasis. No hydronephrosis. GI TRACT: Small hiatal hernia. No evidence of bowel ischemia.  No bowel obstruction. Nonspecific stranding about the rectum and trace fluid in the presacral space. Moderate stool in the rectum. PERITONEUM: No ascites or free air. RETROPERITONEUM: No retroperitoneal hematoma. BLADDER: No acute abnormality. REPRODUCTIVE ORGANS: Enlarged prostate. No acute abnormality. BONES: Superior endplate compression fracture of L2 is similar to 11/05/2023 and new compared to 09/09/2023. Ankylosis of the SI joints. No acute traumatic fracture of the pelvis. IMPRESSION: 1. No evidence of acute GI bleeding or mesenteric ischemia. 2. Stranding in the presacral space and about the rectum is nonspecific. Moderate stool in the rectum. Correlate for fecal impaction and stercoral colitis. 3. Small hiatal hernia. 4. Enlarged prostate. Electronically signed by: Norman Gatlin MD 11/20/2023 12:38 AM EDT RP Workstation: [id number]   CT CHEST ABDOMEN PELVIS W CONTRAST Result Date: 11/20/2023 EXAM: CT ANGIOGRAM CHEST ABDOMEN PELVIS 11/20/2023 12:11:22 AM TECHNIQUE: CTA of the chest, abdomen, pelvis was performed after the administration of iohexol  (OMNIPAQUE ) 350 MG/ML injection. Multiplanar reformatted images are provided for review. MIP images are provided for review. Automated exposure control, iterative reconstruction, and/or weight based adjustment of the mA/kV was utilized to reduce the radiation dose to as low as reasonably achievable. COMPARISON: Same day chest and pelvis radiographs as well as CT from 11/05/2023. CLINICAL HISTORY: Polytrauma, blunt. Patient BIB [facility] c/o fall with head  injury. Patient found by neighbor on ground, covered in dark red emesis. EMS reports patient vomited dark red emesis en-route. Patient arrives with lac to back of head, covered in dark red blood. FINDINGS: CTA CHEST: THORACIC AORTA: No evidence of acute aortic injury. No dissection or aneurysm. MEDIASTINUM: No pericardial effusion. No mediastinal hematoma. No acute traumatic injury to the heart or pericardium. The central airways are clear. Sternotomy and CABG. Coronary artery and aortic atherosclerotic calcifications. Small hiatal hernia. LUNGS: Atelectasis/scarring in the lung bases. No pleural effusion or pneumothorax. CHEST WALL: Subacute or chronic posterior right 10th and 11th rib fractures. No acute fracture in the chest. No chest wall hematoma. CTA ABDOMEN/ PELVIS: ABDOMINAL AORTA: No acute traumatic injury of the aorta. No dissection or aneurysm. ILIAC ARTERIES: No acute traumatic injury of the iliac vessels. No active extravasation of contrast. HEPATOBILIARY: Cholelithiasis without evidence of acute cholecystitis. No  acute traumatic injury. SPLEEN: No acute traumatic injury. PANCREAS: No acute traumatic injury. ADRENAL GLANDS: No acute traumatic injury. KIDNEYS: No acute traumatic injury. No hydronephrosis. GI TRACT: No acute traumatic injury of the bowel. No bowel obstruction. Nonspecific stranding about the rectum and trace fluid in the presacral space. Moderate stool in the rectum. PERITONEUM: No ascites or free air. RETROPERITONEUM: No retroperitoneal hematoma. BLADDER: No acute abnormality. REPRODUCTIVE ORGANS: Enlarged prostate. No acute abnormality. BONES: Superior endplate compression fracture of L2 is similar to 11/05/2023 and new compared to 09/09/2023. Ankylosis of the SI joints. No acute traumatic fracture of the pelvis. IMPRESSION: 1. No acute traumatic injury in the chest, abdomen, or pelvis. 2. Superior endplate compression fracture of L2 is similar to 11/05/2023 and new compared to  09/09/2023. 3. Stranding in the presacral space and about the rectum is nonspecific. Moderate stool in the rectum. Correlate for fecal impaction and circ oral colitis. 4. Small hiatal hernia. 5. Enlarged prostate. Electronically signed by: Norman Gatlin MD 11/20/2023 12:30 AM EDT RP Workstation: [id number]   CT HEAD WO CONTRAST Result Date: 11/20/2023 EXAM: CT HEAD AND CERVICAL SPINE 11/20/2023 12:07:46 AM TECHNIQUE: CT of the head and cervical spine was performed without the administration of intravenous contrast. Multiplanar reformatted images are provided for review. Automated exposure control, iterative reconstruction, and/or weight based adjustment of the mA/kV was utilized to reduce the radiation dose to as low as reasonably achievable. COMPARISON: None available. CLINICAL HISTORY: Head trauma, moderate-severe. Patient BIB [facility] c/o fall with head injury. Patient found by neighbor on ground, covered in dark red emesis. EMS reports patient vomited dark red emesis en-route. Patient arrives with lac to back of head, covered in dark red blood. FINDINGS: CT HEAD BRAIN AND VENTRICLES: Stable ventricular megaly. Chronic microvascular ischemia and generalized atrophy. ORBITS: No acute abnormality. SINUSES AND MASTOIDS: Chronic paranasal sinusitis. No mastoid effusion. SOFT TISSUES AND SKULL: Posterior right paramidline scalp hematoma laceration. No acute skull fracture. CT CERVICAL SPINE BONES AND ALIGNMENT: No evidence of acute fracture or traumatic listhesis. DEGENERATIVE CHANGES: Multilevel spondylosis and facet arthropathy. Disc space height loss and degenerative endplate changes are greatest at C6-C7 where there are moderate changes. No severe spinal canal narrowing. SOFT TISSUES: No prevertebral soft tissue swelling. IMPRESSION: 1. No acute intracranial abnormality. 2. Posterior right paramidline scalp hematoma and laceration. 3. No acute fracture or traumatic malalignment of the cervical spine.  Electronically signed by: Norman Gatlin MD 11/20/2023 12:13 AM EDT RP Workstation: [id number]   CT CERVICAL SPINE WO CONTRAST Result Date: 11/20/2023 EXAM: CT HEAD AND CERVICAL SPINE 11/20/2023 12:07:46 AM TECHNIQUE: CT of the head and cervical spine was performed without the administration of intravenous contrast. Multiplanar reformatted images are provided for review. Automated exposure control, iterative reconstruction, and/or weight based adjustment of the mA/kV was utilized to reduce the radiation dose to as low as reasonably achievable. COMPARISON: None available. CLINICAL HISTORY: Head trauma, moderate-severe. Patient BIB [facility] c/o fall with head injury. Patient found by neighbor on ground, covered in dark red emesis. EMS reports patient vomited dark red emesis en-route. Patient arrives with lac to back of head, covered in dark red blood. FINDINGS: CT HEAD BRAIN AND VENTRICLES: Stable ventricular megaly. Chronic microvascular ischemia and generalized atrophy. ORBITS: No acute abnormality. SINUSES AND MASTOIDS: Chronic paranasal sinusitis. No mastoid effusion. SOFT TISSUES AND SKULL: Posterior right paramidline scalp hematoma laceration. No acute skull fracture. CT CERVICAL SPINE BONES AND ALIGNMENT: No evidence of acute fracture or traumatic listhesis. DEGENERATIVE CHANGES: Multilevel spondylosis and facet  arthropathy. Disc space height loss and degenerative endplate changes are greatest at C6-C7 where there are moderate changes. No severe spinal canal narrowing. SOFT TISSUES: No prevertebral soft tissue swelling. IMPRESSION: 1. No acute intracranial abnormality. 2. Posterior right paramidline scalp hematoma and laceration. 3. No acute fracture or traumatic malalignment of the cervical spine. Electronically signed by: Norman Gatlin MD 11/20/2023 12:13 AM EDT RP Workstation: [id number]   DG Pelvis Portable Result Date: 11/19/2023 CLINICAL DATA:  Status post fall. EXAM: PORTABLE PELVIS 1-2 VIEWS  COMPARISON:  October 28, 2009 FINDINGS: There is no evidence of pelvic fracture or diastasis. No pelvic bone lesions are seen. IMPRESSION: Negative. Electronically Signed   By: Suzen Dials M.D.   On: 11/19/2023 23:33   DG Chest Port 1 View Result Date: 11/19/2023 CLINICAL DATA:  Status post fall. EXAM: PORTABLE CHEST 1 VIEW COMPARISON:  November 05, 2023 FINDINGS: Multiple sternal wires and vascular clips are noted. The heart size and mediastinal contours are within normal limits. A radiopaque loop recorder device is seen. Low lung volumes are noted with mild elevation of the right hemidiaphragm. Both lungs are clear. The visualized skeletal structures are unremarkable. IMPRESSION: 1. Evidence of prior median sternotomy/CABG. 2. No active cardiopulmonary disease. Electronically Signed   By: Suzen Dials M.D.   On: 11/19/2023 23:32       LOS: 0 days   Janard Culp Verdene  [facility] Pager on [email or web address]  11/20/2023, 8:35 AM

## 2023-11-20 NOTE — Transfer of Care (Signed)
 Immediate Anesthesia Transfer of Care Note  Patient: Lynwood JONELLE Rump  Procedure(s) Performed: EGD (ESOPHAGOGASTRODUODENOSCOPY)  Patient Location: Endoscopy Unit  Anesthesia Type:MAC  Level of Consciousness: awake, alert , and oriented  Airway & Oxygen Therapy: Patient Spontanous Breathing and Patient connected to nasal cannula oxygen  Post-op Assessment: Report given to RN and Post -op Vital signs reviewed and stable  Post vital signs: Reviewed and stable  Last Vitals:  Vitals Value Taken Time  BP 95/80 1033  Temp 97 1033  Pulse 75 11/20/23 10:33  Resp 13 11/20/23 10:33  SpO2 98 % 11/20/23 10:33  Vitals shown include unfiled device data.  Last Pain:  Vitals:   11/20/23 0954  TempSrc: Temporal  PainSc: 0-No pain         Complications: No notable events documented.

## 2023-11-20 NOTE — ED Notes (Signed)
 Patient called RN into room states he feels like he is going to pass out, VSS , patient is alert and then states I just had another BM , of which he did in the bed and was very large, patient cleaned.

## 2023-11-20 NOTE — ED Notes (Signed)
 Patient's son [name] requesting call when patient gets moved.

## 2023-11-20 NOTE — ED Notes (Signed)
 CCMD called.

## 2023-11-20 NOTE — ED Notes (Addendum)
 Attempted to get patient up to [facility] to have a BM per request.   Patient was a 2 person extensive assist, and stated that he felt like he was going to pass out while sitting on the [facility].  Patient assisted back to bed and placed on bed pan. Patient taken off bedpan after approx 5 mins because patient stated he couldn't use the bedpan.

## 2023-11-20 NOTE — ED Notes (Signed)
 Charge notified patient coming up

## 2023-11-20 NOTE — Anesthesia Preprocedure Evaluation (Addendum)
 Anesthesia Evaluation  Patient identified by MRN, date of birth, ID band Patient awake    Reviewed: Allergy & Precautions, NPO status , Patient's Chart, lab work & pertinent test results, reviewed documented beta blocker date and time   History of Anesthesia Complications Negative for: history of anesthetic complications  Airway Mallampati: II  TM Distance: >3 FB Neck ROM: Full    Dental  (+) Missing, Dental Advisory Given   Pulmonary sleep apnea (does not use CPAP)    breath sounds clear to auscultation       Cardiovascular hypertension, Pt. on medications (-) angina + CAD and + CABG  + dysrhythmias Atrial Fibrillation  Rhythm:Irregular Rate:Normal  09/2022 ECHO: EF 60 to 65%.  1. The LV has normal function, no regional wall motion abnormalities.   2. RVF is mildly reduced. The right ventricular size is normal. The estimated right ventricular systolic pressure is 37.8 mmHg.   3. Left atrial size was mild to moderately dilated.   4. The mitral valve is degenerative. Mild mitral valve regurgitation. No evidence of mitral stenosis.   5. The aortic valve is grossly normal. There is mild calcification of the aortic valve. There is mild thickening of the aortic valve. Aortic valve regurgitation is not visualized. No aortic stenosis is present.     Neuro/Psych   Anxiety Depression    CVA (walks with walker)    GI/Hepatic Neg liver ROS,,,hematemesis   Endo/Other  diabetes (glu 110), Insulin  Dependent, Oral Hypoglycemic Agents    Renal/GU Renal InsufficiencyRenal disease     Musculoskeletal  (+) Arthritis ,    Abdominal   Peds  Hematology Eliquis  Hb 13.4, plt 298k   Anesthesia Other Findings   Reproductive/Obstetrics                              Anesthesia Physical Anesthesia Plan  ASA: 3  Anesthesia Plan: MAC   Post-op Pain Management: Minimal or no pain anticipated   Induction:   PONV  Risk Score and Plan: 1 and Treatment may vary due to age or medical condition  Airway Management Planned: Natural Airway and Simple Face Mask  Additional Equipment: None  Intra-op Plan:   Post-operative Plan:   Informed Consent: I have reviewed the patients History and Physical, chart, labs and discussed the procedure including the risks, benefits and alternatives for the proposed anesthesia with the patient or authorized representative who has indicated his/her understanding and acceptance.     Dental advisory given and Consent reviewed with POA  Plan Discussed with: CRNA and Surgeon  Anesthesia Plan Comments: (Discussed with pt, and daughter via telephone)         Anesthesia Quick Evaluation

## 2023-11-20 NOTE — H&P (Addendum)
 History and Physical    [name] FMW:[id number] DOB: [birth year] DOA: 11/19/2023  PCP: Loreli Elsie JONETTA Mickey., MD   Patient coming from: ALF   Chief Complaint:  Chief Complaint  Patient presents with   Fall   Hematemesis   ED TRIAGE note:Patient BIB [facility] c/o fall with head injury.  Patient found by neighbor on ground, covered in dark red emesis.  EMS reports patient vomited dark red emesis en-route.  Patient arrives with lac to back of head, covered in dark red blood.    EMS Vitals 170/90 HR 76 CBG 117 18 L wrist 4 mg zofran   HPI:  [name] is a 89 y.o. male with medical history significant of paroxysmal atrial fibrillation on Eliquis , essential hypertension, hyperlipidemia, DM type II, CAD status post CABG, CVA, CKD 3B, BPH, hyperlipidemia, mild cognitive impairment, depression, anxiety, and chronic generalized weakness with physical debility presented to emergency department via EMS for unwitnessed fall.  Patient was found on the ground for unknown period of time with head injury as well as dark appearing emesis.  And route to ED patient has another episodes of emesis as well. Patient has recent hospitalization for altered mental status in the setting of sepsis due to UTI.  EMS reported they notice right blood on the scene as well as dark appearing emesis.  EMS also showed bag full of dark emesis.  Heart Patient has laceration back of the head.  Denies any headache, back pain, neck pain, chest pain and abdominal pain.  Patient reported that he does not recall how ED fell however stated that he fell yesterday evening and someone in the nursing home finally discovered him.  Spoke and updated patient's daughter Sonny stating that patient has unsteady gait for a while and they have been scheduled him to see a neurology in fact he has neurology visit today morning 8/4. Also does not recall having hematemesis. At this time patient does not have any specific complaint.   At  presentation to ED patient found hypertensive blood pressure up to 199/92 otherwise hemodynamically stable.  CT head no acute intracranial abnormality.Posterior right paramidline scalp hematoma and laceration. No acute fracture or traumatic malalignment of the cervical spine.  CT cervical spine no evidence of fracture and dislocation.  CT chest abdomen pelvis no acute traumatic injury of the chest, abdomen and pelvis.. Superior endplate compression fracture of L2 is similar to 11/05/2023 and new compared to 09/09/2023. 3. Stranding in the presacral space and about the rectum is nonspecific. Moderate stool in the rectum. Correlate for fecal impaction and circ oral colitis. 4. Small hiatal hernia. 5. Enlarged prostate.  CT angio GI bleed study no evidence of acute GI bleeding or mesenteric ischemia.  Lab, CBC showing leukocytosis 11.5, normal H&H 14 and 42 and normal platelet count 269.  CMP showing creatinine 1.29 and GFR 54.  Renal function at baseline.  Otherwise CMP unremarkable.  Normal blood alcohol  level.  Elevated pro time normal INR.  Low CK 30.  Troponin within normal range.  Normal lipase level.  FOBT negative.   Elevated lactic acid level 3.4.  EKG showed normal sinus rhythm heart rate 75.  In the ED patient received IV Protonix  40 mg and octreotide  infusion has been started.  In the ED scalp laceration has been stapled and Tranamexic acid packing has been placed.   Per chart review patient has previous EGD in 2003 with showed several duodenal and gastric ulcer.  In the ED patient did not  have any episodes of hematemesis anymore. Dr. Carita will inform and will consult [facility]. Patient is hemodynamically stable, stable H&H and GI study did not show any evidence of active bleeding.  Hospitalist has been consulted for further evaluation and management of hematemesis and mechanical fall.    Significant labs in the ED: Lab Orders         Comprehensive metabolic panel          CBC         Ethanol         Urinalysis, Routine w reflex microscopic -Urine, Clean Catch         Protime-INR         CK         Ammonia         Lipase, blood         Occult bld gastric/duodenum (cup to lab)         Hemoglobin and hematocrit, blood         Comprehensive metabolic panel         CBC         Hemoglobin and hematocrit, blood         I-Stat Chem 8, ED         I-Stat Lactic Acid, ED         POC occult blood, ED         POC occult blood, ED         I-Stat Lactic Acid, ED       Review of Systems:  Review of Systems  Constitutional:  Negative for chills, fever and weight loss.  Eyes:  Negative for blurred vision.  Respiratory:  Negative for cough, sputum production and shortness of breath.   Cardiovascular:  Negative for chest pain and palpitations.  Gastrointestinal:  Negative for abdominal pain, blood in stool, constipation, diarrhea, heartburn, melena, nausea and vomiting.  Musculoskeletal:  Positive for falls. Negative for back pain and joint pain.  Neurological:  Negative for dizziness and headaches.  Psychiatric/Behavioral:  The patient is not nervous/anxious.     Past Medical History:  Diagnosis Date   Acute renal failure (HCC)    secondary to nephrotic syndrome post cath, resolved with steroids   Anxiety    Arthritis    CAD (coronary artery disease) 04/2006   LIMA to LAD, sequential SVG to first, second, and third OM's, sequential SVG to mid RCA and PDA    Depression    Diabetes mellitus without complication [facility])    ED (erectile dysfunction)    HTN (hypertension)    Hx of adenomatous colonic polyps    Hyperlipidemia    Loop recorder: Biotronic Biomonitor II loop 07/28/2021 07/28/2021   Obesity    Stroke (HCC)    june    Past Surgical History:  Procedure Laterality Date   COLONOSCOPY W/ BIOPSIES AND POLYPECTOMY     CORONARY ARTERY BYPASS GRAFT  2008   VESSELS X6   IR CT HEAD LTD  10/02/2022   IR PERCUTANEOUS ART THROMBECTOMY/INFUSION INTRACRANIAL  INC DIAG ANGIO  10/02/2022   IR US  GUIDE VASC ACCESS RIGHT  10/02/2022   LOOP RECORDER IMPLANT     RADIOLOGY WITH ANESTHESIA N/A 10/02/2022   Procedure: IR WITH ANESTHESIA;  Surgeon: Radiologist, Medication, MD;  Location: [facility] OR;  Service: Radiology;  Laterality: N/A;   UMBILICAL HERNIA REPAIR       reports that he has never smoked. He has never used smokeless tobacco. He reports that  he does not drink alcohol  and does not use drugs.  Allergies  Allergen Reactions   Ketorolac Tromethamine Other (See Comments)    nephrotic syndrome    Family History  Problem Relation Age of Onset   Heart attack Mother    Clotting disorder Father        died of renal disease   Heart disease Sister    Cancer Sister    Colon cancer Neg Hx     Prior to Admission medications   Medication Sig Start Date End Date Taking? Authorizing Provider  acebutolol  (SECTRAL ) 200 MG capsule Take 1 capsule (200 mg total) by mouth 2 (two) times daily. 02/05/23   Ladona Heinz, MD  apixaban  (ELIQUIS ) 5 MG TABS tablet Take 1 tablet (5 mg total) by mouth 2 (two) times daily. 10/18/23   Raulkar, Sven SQUIBB, MD  atorvastatin  (LIPITOR ) 80 MG tablet Take 1 tablet (80 mg total) by mouth daily. Patient taking differently: Take 80 mg by mouth at bedtime. 10/12/22   Angiulli, Toribio PARAS, PA-C  buPROPion  (WELLBUTRIN  XL) 150 MG 24 hr tablet Take 150 mg by mouth every morning.    [provider]  empagliflozin  (JARDIANCE ) 25 MG TABS tablet Take 1 tablet (25 mg total) by mouth daily. Patient taking differently: Take 25 mg by mouth at bedtime. 10/12/22   Angiulli, Toribio PARAS, PA-C  escitalopram  (LEXAPRO ) 10 MG tablet Take 10 mg by mouth daily. 11/21/22   [provider]  ezetimibe  (ZETIA ) 10 MG tablet Take 10 mg by mouth daily. 11/21/22   [provider]  furosemide  (LASIX ) 40 MG tablet Take 1 tablet (40 mg total) by mouth daily as needed for edema. 12/28/22   Ladona Heinz, MD  metFORMIN  (GLUCOPHAGE ) 1000 MG tablet Take 1,000 mg  by mouth 2 (two) times daily with a meal.    [provider]  mirabegron  ER (MYRBETRIQ ) 50 MG TB24 tablet Take 1 tablet (50 mg total) by mouth daily. Patient not taking: Reported on 11/06/2023 10/12/22   Angiulli, Toribio PARAS, PA-C  olmesartan  (BENICAR ) 20 MG tablet Take 1 tablet (20 mg total) by mouth daily. 10/12/22   Angiulli, Toribio PARAS, PA-C  tadalafil  (CIALIS ) 5 MG tablet Take 1 tablet (5 mg total) by mouth daily. Patient taking differently: Take 5 mg by mouth at bedtime. 10/12/22   Angiulli, Toribio PARAS, PA-C  tamsulosin  (FLOMAX ) 0.4 MG CAPS capsule Take 1 capsule (0.4 mg total) by mouth daily after supper. Patient taking differently: Take 0.4 mg by mouth at bedtime. 10/12/22   Angiulli, Toribio PARAS, PA-C  Vitamin D , Ergocalciferol , (DRISDOL ) 1.25 MG (50000 UNIT) CAPS capsule Take 1 capsule (50,000 Units total) by mouth every 7 (seven) days. Patient not taking: Reported on 11/06/2023 12/13/22   Lorilee Sven SQUIBB, MD     Physical Exam: Vitals:   11/20/23 0045 11/20/23 0100 11/20/23 0115 11/20/23 0130  BP: (!) 166/92 (!) 175/102 (!) 172/95 133/80  Pulse: 83 82 81 82  Resp: 17 16 13  (!) 22  Temp:      TempSrc:      SpO2: 98% 95% 98% 91%  Weight:        Physical Exam Vitals and nursing note reviewed.  Constitutional:      Appearance: He is not ill-appearing.  HENT:     Head:     Comments: Scalp laceration has been stapled    Mouth/Throat:     Mouth: Mucous membranes are moist.  Eyes:     Pupils: Pupils are equal, round,  and reactive to light.  Cardiovascular:     Rate and Rhythm: Normal rate and regular rhythm.     Pulses: Normal pulses.     Heart sounds: Normal heart sounds.  Pulmonary:     Effort: Pulmonary effort is normal.     Breath sounds: Normal breath sounds.  Abdominal:     General: There is no distension.     Palpations: Abdomen is soft.     Tenderness: There is no abdominal tenderness.  Musculoskeletal:     Cervical back: Neck supple.     Right lower leg: No  edema.     Left lower leg: Edema present.  Skin:    General: Skin is warm.     Capillary Refill: Capillary refill takes less than 2 seconds.  Neurological:     Mental Status: He is oriented to person, place, and time.  Psychiatric:        Mood and Affect: Mood normal.      Labs on Admission: I have personally reviewed following labs and imaging studies  CBC: Recent Labs  Lab 11/19/23 2309 11/19/23 2321 11/20/23 0127  WBC 11.5*  --   --   HGB 14.0 14.3 13.2  HCT 42.7 42.0 40.1  MCV 99.1  --   --   PLT 269  --   --    Basic Metabolic Panel: Recent Labs  Lab 11/19/23 2309 11/19/23 2321  NA 137 137  K 4.5 4.5  CL 100 99  CO2 26  --   GLUCOSE 151* 150*  BUN 23 25*  CREATININE 1.29* 1.30*  CALCIUM  9.2  --    GFR: Estimated Creatinine Clearance: 40 mL/min (A) (by C-G formula based on SCr of 1.3 mg/dL (H)). Liver Function Tests: Recent Labs  Lab 11/19/23 2309  AST 39  ALT 29  ALKPHOS 77  BILITOT 0.8  PROT 6.4*  ALBUMIN 3.0*   Recent Labs  Lab 11/19/23 2309  LIPASE 28   No results for input(s): AMMONIA in the last 168 hours. Coagulation Profile: Recent Labs  Lab 11/19/23 2309  INR 1.2   Cardiac Enzymes: Recent Labs  Lab 11/19/23 2309 11/20/23 0114  CKTOTAL 30*  --   TROPONINIHS 5 6   BNP (last 3 results) Recent Labs    11/29/22 0953 12/07/22 1117 11/05/23 2008  BNP 233.6* 57.2 243.7*   HbA1C: No results for input(s): HGBA1C in the last 72 hours. CBG: No results for input(s): GLUCAP in the last 168 hours. Lipid Profile: No results for input(s): CHOL, HDL, LDLCALC, TRIG, CHOLHDL, LDLDIRECT in the last 72 hours. Thyroid  Function Tests: No results for input(s): TSH, T4TOTAL, FREET4, T3FREE, THYROIDAB in the last 72 hours. Anemia Panel: No results for input(s): VITAMINB12, FOLATE, FERRITIN, TIBC, IRON, RETICCTPCT in the last 72 hours. Urine analysis:    Component Value Date/Time   COLORURINE STRAW  (A) 11/19/2023 2309   APPEARANCEUR HAZY (A) 11/19/2023 2309   LABSPEC 1.017 11/19/2023 2309   PHURINE 8.0 11/19/2023 2309   GLUCOSEU >=500 (A) 11/19/2023 2309   HGBUR NEGATIVE 11/19/2023 2309   BILIRUBINUR NEGATIVE 11/19/2023 2309   KETONESUR NEGATIVE 11/19/2023 2309   PROTEINUR 30 (A) 11/19/2023 2309   UROBILINOGEN 1.0 10/11/2009 0815   NITRITE NEGATIVE 11/19/2023 2309   LEUKOCYTESUR NEGATIVE 11/19/2023 2309    Radiological Exams on Admission: I have personally reviewed images CT ANGIO GI BLEED Result Date: 11/20/2023 EXAM: CTA ABDOMEN AND PELVIS WITH CONTRAST 11/20/2023 12:11:50 AM TECHNIQUE: CTA images of the abdomen and pelvis  with intravenous contrast. Three-dimensional MIP/volume rendered formations were performed. Automated exposure control, iterative reconstruction, and/or weight based adjustment of the mA/kV was utilized to reduce the radiation dose to as low as reasonably achievable. COMPARISON: None available. CLINICAL HISTORY: Mesenteric ischemia, acute. Table formatting from the original note was not included. Images from the original note were not included. Patient BIB [facility] c/o fall with head injury. Patient found by neighbor on ground, covered in dark red emesis. EMS reports patient vomited dark red emesis en-route. Patient arrives with lac to back of head, covered in dark red blood. FINDINGS: (Note: Venous images are in the jacket for the CT chest abdomen and pelvis performed for blunt polytrauma.) GI BLEED: No active extravasation of contrast within the GI tract. VASCULAR: Advanced mixed density atherosclerotic plaque in the aorta without hemodynamically significant stenosis. No aneurysm or dissection. Moderate-to-severe narrowing at the origin of the SMA. Atherosclerotic plaque causes additional multifocal areas of mild narrowing in the celiac axis, bilateral renal arteries and iliac arteries. LOWER CHEST: Visualized portion of the lower chest demonstrates no acute abnormality. CT  ABDOMEN/ PELVIS: HEPATOBILIARY: Cholelithiasis without evidence of acute cholecystitis. No acute traumatic injury. SPLEEN: Unremarkable. PANCREAS: Unremarkable. ADRENAL GLANDS: Unremarkable. KIDNEYS: No nephrolithiasis. No hydronephrosis. GI TRACT: Small hiatal hernia. No evidence of bowel ischemia.  No bowel obstruction. Nonspecific stranding about the rectum and trace fluid in the presacral space. Moderate stool in the rectum. PERITONEUM: No ascites or free air. RETROPERITONEUM: No retroperitoneal hematoma. BLADDER: No acute abnormality. REPRODUCTIVE ORGANS: Enlarged prostate. No acute abnormality. BONES: Superior endplate compression fracture of L2 is similar to 11/05/2023 and new compared to 09/09/2023. Ankylosis of the SI joints. No acute traumatic fracture of the pelvis. IMPRESSION: 1. No evidence of acute GI bleeding or mesenteric ischemia. 2. Stranding in the presacral space and about the rectum is nonspecific. Moderate stool in the rectum. Correlate for fecal impaction and stercoral colitis. 3. Small hiatal hernia. 4. Enlarged prostate. Electronically signed by: Norman Gatlin MD 11/20/2023 12:38 AM EDT RP Workstation: [id number]   CT CHEST ABDOMEN PELVIS W CONTRAST Result Date: 11/20/2023 EXAM: CT ANGIOGRAM CHEST ABDOMEN PELVIS 11/20/2023 12:11:22 AM TECHNIQUE: CTA of the chest, abdomen, pelvis was performed after the administration of iohexol  (OMNIPAQUE ) 350 MG/ML injection. Multiplanar reformatted images are provided for review. MIP images are provided for review. Automated exposure control, iterative reconstruction, and/or weight based adjustment of the mA/kV was utilized to reduce the radiation dose to as low as reasonably achievable. COMPARISON: Same day chest and pelvis radiographs as well as CT from 11/05/2023. CLINICAL HISTORY: Polytrauma, blunt. Patient BIB [facility] c/o fall with head injury. Patient found by neighbor on ground, covered in dark red emesis. EMS reports patient vomited dark red  emesis en-route. Patient arrives with lac to back of head, covered in dark red blood. FINDINGS: CTA CHEST: THORACIC AORTA: No evidence of acute aortic injury. No dissection or aneurysm. MEDIASTINUM: No pericardial effusion. No mediastinal hematoma. No acute traumatic injury to the heart or pericardium. The central airways are clear. Sternotomy and CABG. Coronary artery and aortic atherosclerotic calcifications. Small hiatal hernia. LUNGS: Atelectasis/scarring in the lung bases. No pleural effusion or pneumothorax. CHEST WALL: Subacute or chronic posterior right 10th and 11th rib fractures. No acute fracture in the chest. No chest wall hematoma. CTA ABDOMEN/ PELVIS: ABDOMINAL AORTA: No acute traumatic injury of the aorta. No dissection or aneurysm. ILIAC ARTERIES: No acute traumatic injury of the iliac vessels. No active extravasation of contrast. HEPATOBILIARY: Cholelithiasis without evidence of acute  cholecystitis. No acute traumatic injury. SPLEEN: No acute traumatic injury. PANCREAS: No acute traumatic injury. ADRENAL GLANDS: No acute traumatic injury. KIDNEYS: No acute traumatic injury. No hydronephrosis. GI TRACT: No acute traumatic injury of the bowel. No bowel obstruction. Nonspecific stranding about the rectum and trace fluid in the presacral space. Moderate stool in the rectum. PERITONEUM: No ascites or free air. RETROPERITONEUM: No retroperitoneal hematoma. BLADDER: No acute abnormality. REPRODUCTIVE ORGANS: Enlarged prostate. No acute abnormality. BONES: Superior endplate compression fracture of L2 is similar to 11/05/2023 and new compared to 09/09/2023. Ankylosis of the SI joints. No acute traumatic fracture of the pelvis. IMPRESSION: 1. No acute traumatic injury in the chest, abdomen, or pelvis. 2. Superior endplate compression fracture of L2 is similar to 11/05/2023 and new compared to 09/09/2023. 3. Stranding in the presacral space and about the rectum is nonspecific. Moderate stool in the rectum.  Correlate for fecal impaction and circ oral colitis. 4. Small hiatal hernia. 5. Enlarged prostate. Electronically signed by: Norman Gatlin MD 11/20/2023 12:30 AM EDT RP Workstation: [id number]   CT HEAD WO CONTRAST Result Date: 11/20/2023 EXAM: CT HEAD AND CERVICAL SPINE 11/20/2023 12:07:46 AM TECHNIQUE: CT of the head and cervical spine was performed without the administration of intravenous contrast. Multiplanar reformatted images are provided for review. Automated exposure control, iterative reconstruction, and/or weight based adjustment of the mA/kV was utilized to reduce the radiation dose to as low as reasonably achievable. COMPARISON: None available. CLINICAL HISTORY: Head trauma, moderate-severe. Patient BIB [facility] c/o fall with head injury. Patient found by neighbor on ground, covered in dark red emesis. EMS reports patient vomited dark red emesis en-route. Patient arrives with lac to back of head, covered in dark red blood. FINDINGS: CT HEAD BRAIN AND VENTRICLES: Stable ventricular megaly. Chronic microvascular ischemia and generalized atrophy. ORBITS: No acute abnormality. SINUSES AND MASTOIDS: Chronic paranasal sinusitis. No mastoid effusion. SOFT TISSUES AND SKULL: Posterior right paramidline scalp hematoma laceration. No acute skull fracture. CT CERVICAL SPINE BONES AND ALIGNMENT: No evidence of acute fracture or traumatic listhesis. DEGENERATIVE CHANGES: Multilevel spondylosis and facet arthropathy. Disc space height loss and degenerative endplate changes are greatest at C6-C7 where there are moderate changes. No severe spinal canal narrowing. SOFT TISSUES: No prevertebral soft tissue swelling. IMPRESSION: 1. No acute intracranial abnormality. 2. Posterior right paramidline scalp hematoma and laceration. 3. No acute fracture or traumatic malalignment of the cervical spine. Electronically signed by: Norman Gatlin MD 11/20/2023 12:13 AM EDT RP Workstation: [id number]   CT CERVICAL SPINE WO  CONTRAST Result Date: 11/20/2023 EXAM: CT HEAD AND CERVICAL SPINE 11/20/2023 12:07:46 AM TECHNIQUE: CT of the head and cervical spine was performed without the administration of intravenous contrast. Multiplanar reformatted images are provided for review. Automated exposure control, iterative reconstruction, and/or weight based adjustment of the mA/kV was utilized to reduce the radiation dose to as low as reasonably achievable. COMPARISON: None available. CLINICAL HISTORY: Head trauma, moderate-severe. Patient BIB [facility] c/o fall with head injury. Patient found by neighbor on ground, covered in dark red emesis. EMS reports patient vomited dark red emesis en-route. Patient arrives with lac to back of head, covered in dark red blood. FINDINGS: CT HEAD BRAIN AND VENTRICLES: Stable ventricular megaly. Chronic microvascular ischemia and generalized atrophy. ORBITS: No acute abnormality. SINUSES AND MASTOIDS: Chronic paranasal sinusitis. No mastoid effusion. SOFT TISSUES AND SKULL: Posterior right paramidline scalp hematoma laceration. No acute skull fracture. CT CERVICAL SPINE BONES AND ALIGNMENT: No evidence of acute fracture or traumatic listhesis. DEGENERATIVE CHANGES: Multilevel spondylosis  and facet arthropathy. Disc space height loss and degenerative endplate changes are greatest at C6-C7 where there are moderate changes. No severe spinal canal narrowing. SOFT TISSUES: No prevertebral soft tissue swelling. IMPRESSION: 1. No acute intracranial abnormality. 2. Posterior right paramidline scalp hematoma and laceration. 3. No acute fracture or traumatic malalignment of the cervical spine. Electronically signed by: Norman Gatlin MD 11/20/2023 12:13 AM EDT RP Workstation: [id number]   DG Pelvis Portable Result Date: 11/19/2023 CLINICAL DATA:  Status post fall. EXAM: PORTABLE PELVIS 1-2 VIEWS COMPARISON:  October 28, 2009 FINDINGS: There is no evidence of pelvic fracture or diastasis. No pelvic bone lesions are seen.  IMPRESSION: Negative. Electronically Signed   By: Suzen Dials M.D.   On: 11/19/2023 23:33   DG Chest Port 1 View Result Date: 11/19/2023 CLINICAL DATA:  Status post fall. EXAM: PORTABLE CHEST 1 VIEW COMPARISON:  November 05, 2023 FINDINGS: Multiple sternal wires and vascular clips are noted. The heart size and mediastinal contours are within normal limits. A radiopaque loop recorder device is seen. Low lung volumes are noted with mild elevation of the right hemidiaphragm. Both lungs are clear. The visualized skeletal structures are unremarkable. IMPRESSION: 1. Evidence of prior median sternotomy/CABG. 2. No active cardiopulmonary disease. Electronically Signed   By: Suzen Dials M.D.   On: 11/19/2023 23:32     EKG: My personal interpretation of EKG shows: Normal sinus rhythm heart rate 75    Assessment/Plan: Principal Problem:   Hematemesis Active Problems:   Fall at home, initial encounter   Lactic acidosis   Paroxysmal atrial fibrillation (HCC)   Non-insulin  dependent type 2 diabetes mellitus (HCC)   Hyperlipidemia   Depression with anxiety   BPH (benign prostatic hyperplasia)   Essential hypertension   History of CVA (cerebrovascular accident)   Mild cognitive impairment   History of unsteady gait   History of CAD (coronary artery disease)   CKD stage 3b, GFR 30-44 ml/min (HCC)   GAD (generalized anxiety disorder)   Scalp laceration, initial encounter    Assessment and Plan: Hematemesis History of GI ulcer Presented to emergency department via EMS for evaluation for mechanical fall.  Unknown downtime and patient found in blood due to hematemesis.  And route to ED via EMS patient had another episodes of hematemesis as well. - At presentation to ED hemodynamically stable. - CBC showed normal H&H 14 and 43.  Normal WBC and platelet count.  CMP unremarkable. -CT abdomen GI bleed study no evidence of acute bleeding or mesenteric ischemia.  It showed fecal impaction stool  burden and sterooral colitis. - Per chart review previous EGD 04/2021 showed esophageal ulcer, esophageal stenosis, erosive gastropathy. -Given patient is hemodynamically stable, stable H&H and CT ABD did not show any evidence of active GI bleed there is no indication for urgent scoping tonight. - Continue IV Protonix  40 mg twice daily. -Dr. Carita has consulted [facility] for further evaluation and management. -Continue monitor H&H. -Updated and discussed with patient daughter Sonny over phone and she is giving permission to give blood transfusion if needed.   Lactic acidosis -Elevated lactic acid 3.4.  Patient is hemodynamically stable.  At this time there is no evidence of infection and CT abdomen pelvis evidence of mesenteric ischemia.  Patient was initially hypertensive and blood pressure has been improved.  Lactic acidosis in the setting of fall and metformin  use rather than any infectious source/hypoperfusion. Repeat lactic acid in the morning.  Mechanical fall History of unsteady gait. Scalp laceration and hematoma -  Patient had a unwitnessed fall at assisted living facility.  Found to have scalp laceration which has been sutured. In the ED patient has extensive imaging CT head, CT cervical spine, CT chest abdomen pelvis no evidence of fracture, dislocation and intracranial bleeding.  CT head only showed scalp posterior paramidline scalp hematoma and laceration.  Laceration has been sutured in the ED.  Bleeding has been stopped. -Patient denies any headache. Patient's daughter stated that he has unsteady gait and patient supposed to be evaluated by neurology outpatient today 8/4. -Continue fall precaution. - Need to have further discussion with patient's family-son [name] Axon and daughter Sonny regarding further continuation of Eliquis  in the long run given high risk of bleeding from fall.    Paroxysmal atrial fibrillation -Continue acebutolol  200 mg twice daily. - Eliquis  on hold  in the setting of GI bleed. - Also need to  have further discussion with patient's family-son [name] Axon and daughter Sonny regarding further continuation of Eliquis  in the long run given high risk of bleeding from fall.   Essential hypertension -Initially patient was hypertensive in the setting of pain however blood pressure has been improved.  Holding all blood pressure regimen except continue acebutolol .  Hyperlipidemia -Continue Zetia  and Lipitor   BPH Continue Flomax    Constipation - CT abdomen pelvis showed Stranding in the presacral space and about the rectum is nonspecific. Moderate stool in the rectum. Correlate for fecal impaction and circ oral colitis. Patient is complaining of lower abdominal pain. - Continue MiraLAX  and Senokot twice daily.  CKD stage IIIb -Creatinine 1.29 and GFR 54.SABRA  Renal function at baseline.  History of CAD status post CABG Continue acebutolol , Zetia  and Lipitor .  Eliquis  on hold in the setting of mechanical fall high risk of bleeding and GI bleed.  History of CVA - Continue lipid-lowering agent.  Eliquis  on hold.   History of memory impairment History of GAD and depression -Continue bupropion   Non-insulin -dependent DM type II - Continue Jardiance .  Holding metformin  in setting of lactic acidosis.  DVT prophylaxis:  SCDs Code Status:  Full Code.  Verified with patient daughter Diet: Heart healthy carb modified diet Family Communication:   Family was present at bedside, at the time of interview. Opportunity was given to ask question and all questions were answered satisfactorily.  Disposition Plan: Continue to monitor H&H transfuse as needed. Consults: Gastroenterology Admission status:   Inpatient, Step Down Unit  Severity of Illness: The appropriate patient status for this patient is INPATIENT. Inpatient status is judged to be reasonable and necessary in order to provide the required intensity of service to ensure the patient's safety.  The patient's presenting symptoms, physical exam findings, and initial radiographic and laboratory data in the context of their chronic comorbidities is felt to place them at high risk for further clinical deterioration. Furthermore, it is not anticipated that the patient will be medically stable for discharge from the hospital within 2 midnights of admission.   * I certify that at the point of admission it is my clinical judgment that the patient will require inpatient hospital care spanning beyond 2 midnights from the point of admission due to high intensity of service, high risk for further deterioration and high frequency of surveillance required.DEWA[place]    Khristen Cheyney, MD [facility]  How to contact the [facility] Attending or Consulting provider 7A - 7P or covering provider during after hours 7P -7A, for this patient.  Check the care team in [facility] and look for a) attending/consulting [facility]  provider listed and b) the [facility] team listed Log into [email or web address] and use [facility] universal password to access. If you do not have the password, please contact the hospital operator. Locate the [facility] provider you are looking for under [facility] and page to a number that you can be directly reached. If you still have difficulty reaching the provider, please page the [facility] (Director on Call) for the Hospitalists listed on amion for assistance.  11/20/2023, 2:53 AM

## 2023-11-20 NOTE — ED Notes (Signed)
 Patient has another large BM , Patient cleaned and repositioned in bed

## 2023-11-20 NOTE — ED Notes (Signed)
 Pt wound/abrasion noted right buttocks. Looks like two diagonal scratches. Does not appear to look lack a pressure injury

## 2023-11-20 NOTE — ED Notes (Signed)
 Patient's c-collar swapped out for [place]-j.

## 2023-11-20 NOTE — Consult Note (Signed)
 Consultation Note   Referring Provider:  [facility] PCP: Loreli Elsie JONETTA Mickey., MD Primary Gastroenterologist:  Lupita Commander, MD       Reason for Consultation:  Hematemesis DOA: 11/19/2023         Hospital Day: 2  Patient profile 89 year old male with a GI history of GERD/esophagitis, gastric ulcers, and colon polyps.  He has multiple other medical problems as listed below and in the PMH.  Patient admitted with fall at home and hematemesis  ASSESSMENT    GERD / history of esophagitis / Barrett's esophagus History of gastric ulcers Hematemesis in setting of Eliquis  ( this admission) Rule out esophagitis, recurrent PUD, Mallory-Weiss tear.   History of colon polyps Several small adenomatous colon polyps removed in 2014.  Repeat colonoscopy not done due to age  CAD status post CABG Atrial fibrillation on Eliquis  Last dose of Eliquis  was Saturday 8/2 around 12 noon  History of CVA  CKD 3B  DM2   Principal Problem:   Hematemesis Active Problems:   Hyperlipidemia   Depression with anxiety   BPH (benign prostatic hyperplasia)   Paroxysmal atrial fibrillation (HCC)   Non-insulin  dependent type 2 diabetes mellitus (HCC)   Essential hypertension   History of CVA (cerebrovascular accident)   Mild cognitive impairment   History of unsteady gait   Fall at home, initial encounter   History of CAD (coronary artery disease)   CKD stage 3b, GFR 30-44 ml/min (HCC)   Lactic acidosis   GAD (generalized anxiety disorder)   Scalp laceration, initial encounter      PLAN:   --Schedule for EGD. The risks and benefits of EGD with possible biopsies were discussed with the patient who agrees to proceed.  --NPO in case procedure can be done today --Continue BID IV PPI   HPI   89 y.o. year old male with a medical history including but not limited to GERD with esophagitis, Barrett's esophagus, gastric ulcers  Patient was recently  hospitalized for sepsis  /possible cystitis and Klebsiella pneumonia .    Readmitted yesterday after being found on the ground covered in dark emesis.  Per H&P, EMS reported that patient vomited dark red emesis en route.  Patient tells me that he had a headache yesterday.  He describes falling but not losing consciousness.  At some point after falling he vomited red blood but does not know how much.  He describes having a large bowel movement yesterday after not having had 1 for a week.  He did not see any blood in the stool.  No black stools.  He has not been having any abdominal pain, nausea nor vomiting.  He does not take NSAIDs.  Not on acid reducer medication.   He has been hemodynamically stable.  Head CT showed a posterior scalp hematoma and laceration .  No acute abnormalities on CT cervical spine . Presenting hemoglobin 14 which is near baseline.  Overnight hemoglobin has remained relatively stable at 13.2.  BUN is at baseline (23-25).  K+ 5.6.  FOBT negative.   CT angio chest abdomen and pelvis No acute traumatic injury in the chest, abdomen, or pelvis. 2. Superior endplate compression fracture of L2 is similar to 11/05/2023 and new compared to  09/09/2023. 3. Stranding in the presacral space and about the rectum is nonspecific. Moderate stool in the rectum. Correlate for fecal impaction and circ oral colitis. 4. Small hiatal hernia. 5. Enlarged prostate.  Labs and Imaging:  Recent Labs    11/19/23 2309 11/20/23 0528  PROT 6.4* 5.4*  ALBUMIN 3.0* 3.0*  AST 39 33  ALT 29 27  ALKPHOS 77 69  BILITOT 0.8 1.1   Recent Labs    11/19/23 2309 11/19/23 2321 11/20/23 0127 11/20/23 0528  WBC 11.5*  --   --  19.2*  HGB 14.0 14.3 13.2 13.4  HCT 42.7 42.0 40.1 40.9  MCV 99.1  --   --  99.8  PLT 269  --   --  298   Recent Labs    11/19/23 2309 11/19/23 2321 11/20/23 0528  NA 137 137 137  K 4.5 4.5 5.6*  CL 100 99 101  CO2 26  --  27  GLUCOSE 151* 150* 134*  BUN 23 25* 23   CREATININE 1.29* 1.30* 1.39*  CALCIUM  9.2  --  9.3     CT ANGIO GI BLEED EXAM: CTA ABDOMEN AND PELVIS WITH CONTRAST 11/20/2023 12:11:50 AM  TECHNIQUE: CTA images of the abdomen and pelvis with intravenous contrast. Three-dimensional MIP/volume rendered formations were performed. Automated exposure control, iterative reconstruction, and/or weight based adjustment of the mA/kV was utilized to reduce the radiation dose to as low as reasonably achievable.  COMPARISON: None available.  CLINICAL HISTORY: Mesenteric ischemia, acute. Table formatting from the original note was not included. Images from the original note were not included. Patient BIB [facility] c/o fall with head injury. Patient found by neighbor on ground, covered in dark red emesis. EMS reports patient vomited dark red emesis en-route. Patient arrives with lac to back of head, covered in dark red blood.  FINDINGS:  (Note: Venous images are in the jacket for the CT chest abdomen and pelvis performed for blunt polytrauma.)  GI BLEED: No active extravasation of contrast within the GI tract.  VASCULAR: Advanced mixed density atherosclerotic plaque in the aorta without hemodynamically significant stenosis. No aneurysm or dissection. Moderate-to-severe narrowing at the origin of the SMA. Atherosclerotic plaque causes additional multifocal areas of mild narrowing in the celiac axis, bilateral renal arteries and iliac arteries.  LOWER CHEST: Visualized portion of the lower chest demonstrates no acute abnormality.  CT ABDOMEN/ PELVIS:  HEPATOBILIARY: Cholelithiasis without evidence of acute cholecystitis. No acute traumatic injury.  SPLEEN: Unremarkable.  PANCREAS: Unremarkable.  ADRENAL GLANDS: Unremarkable.  KIDNEYS: No nephrolithiasis. No hydronephrosis.  GI TRACT: Small hiatal hernia. No evidence of bowel ischemia.  No bowel obstruction. Nonspecific stranding about the rectum and trace fluid in the  presacral space. Moderate stool in the rectum.  PERITONEUM: No ascites or free air.  RETROPERITONEUM: No retroperitoneal hematoma.  BLADDER: No acute abnormality.  REPRODUCTIVE ORGANS: Enlarged prostate. No acute abnormality.  BONES: Superior endplate compression fracture of L2 is similar to 11/05/2023 and new compared to 09/09/2023. Ankylosis of the SI joints. No acute traumatic fracture of the pelvis.  IMPRESSION: 1. No evidence of acute GI bleeding or mesenteric ischemia. 2. Stranding in the presacral space and about the rectum is nonspecific. Moderate stool in the rectum. Correlate for fecal impaction and stercoral colitis. 3. Small hiatal hernia. 4. Enlarged prostate.  Electronically signed by: Norman Gatlin MD 11/20/2023 12:38 AM EDT RP Workstation: [id number] CT CHEST ABDOMEN PELVIS W CONTRAST EXAM: CT ANGIOGRAM CHEST ABDOMEN PELVIS 11/20/2023 12:11:22 AM  TECHNIQUE: CTA of  the chest, abdomen, pelvis was performed after the administration of 100mL iohexol  (OMNIPAQUE ) 350 MG/ML injection. Multiplanar reformatted images are provided for review. MIP images are provided for review. Automated exposure control, iterative reconstruction, and/or weight based adjustment of the mA/kV was utilized to reduce the radiation dose to as low as reasonably achievable.  COMPARISON: Same day chest and pelvis radiographs as well as CT from 11/05/2023.  CLINICAL HISTORY: Polytrauma, blunt. Patient BIB [facility] c/o fall with head injury. Patient found by neighbor on ground, covered in dark red emesis. EMS reports patient vomited dark red emesis en-route. Patient arrives with lac to back of head, covered in dark red blood.  FINDINGS:  CTA CHEST:  THORACIC AORTA: No evidence of acute aortic injury. No dissection or aneurysm.  MEDIASTINUM: No pericardial effusion. No mediastinal hematoma. No acute traumatic injury to the heart or pericardium. The central airways are clear.  Sternotomy and CABG. Coronary artery and aortic atherosclerotic calcifications. Small hiatal hernia.  LUNGS: Atelectasis/scarring in the lung bases. No pleural effusion or pneumothorax.  CHEST WALL: Subacute or chronic posterior right 10th and 11th rib fractures. No acute fracture in the chest. No chest wall hematoma.  CTA ABDOMEN/ PELVIS:  ABDOMINAL AORTA: No acute traumatic injury of the aorta. No dissection or aneurysm.  ILIAC ARTERIES: No acute traumatic injury of the iliac vessels. No active extravasation of contrast.  HEPATOBILIARY: Cholelithiasis without evidence of acute cholecystitis. No acute traumatic injury.  SPLEEN: No acute traumatic injury.  PANCREAS: No acute traumatic injury.  ADRENAL GLANDS: No acute traumatic injury.  KIDNEYS: No acute traumatic injury. No hydronephrosis.  GI TRACT: No acute traumatic injury of the bowel. No bowel obstruction. Nonspecific stranding about the rectum and trace fluid in the presacral space. Moderate stool in the rectum.  PERITONEUM: No ascites or free air.  RETROPERITONEUM: No retroperitoneal hematoma.  BLADDER: No acute abnormality.  REPRODUCTIVE ORGANS: Enlarged prostate. No acute abnormality.  BONES: Superior endplate compression fracture of L2 is similar to 11/05/2023 and new compared to 09/09/2023. Ankylosis of the SI joints. No acute traumatic fracture of the pelvis.  IMPRESSION: 1. No acute traumatic injury in the chest, abdomen, or pelvis. 2. Superior endplate compression fracture of L2 is similar to 11/05/2023 and new compared to 09/09/2023. 3. Stranding in the presacral space and about the rectum is nonspecific. Moderate stool in the rectum. Correlate for fecal impaction and circ oral colitis. 4. Small hiatal hernia. 5. Enlarged prostate.  Electronically signed by: Norman Gatlin MD 11/20/2023 12:30 AM EDT RP Workstation: [id number] CT HEAD WO CONTRAST EXAM: CT HEAD AND CERVICAL  SPINE 11/20/2023 12:07:46 AM  TECHNIQUE: CT of the head and cervical spine was performed without the administration of intravenous contrast. Multiplanar reformatted images are provided for review. Automated exposure control, iterative reconstruction, and/or weight based adjustment of the mA/kV was utilized to reduce the radiation dose to as low as reasonably achievable.  COMPARISON: None available.  CLINICAL HISTORY: Head trauma, moderate-severe. Patient BIB [facility] c/o fall with head injury. Patient found by neighbor on ground, covered in dark red emesis. EMS reports patient vomited dark red emesis en-route. Patient arrives with lac to back of head, covered in dark red blood.  FINDINGS: CT HEAD  BRAIN AND VENTRICLES: Stable ventricular megaly. Chronic microvascular ischemia and generalized atrophy.  ORBITS: No acute abnormality.  SINUSES AND MASTOIDS: Chronic paranasal sinusitis. No mastoid effusion.  SOFT TISSUES AND SKULL: Posterior right paramidline scalp hematoma laceration. No acute skull fracture.  CT CERVICAL SPINE  BONES AND  ALIGNMENT: No evidence of acute fracture or traumatic listhesis.  DEGENERATIVE CHANGES: Multilevel spondylosis and facet arthropathy. Disc space height loss and degenerative endplate changes are greatest at C6-C7 where there are moderate changes. No severe spinal canal narrowing.  SOFT TISSUES: No prevertebral soft tissue swelling.  IMPRESSION: 1. No acute intracranial abnormality. 2. Posterior right paramidline scalp hematoma and laceration. 3. No acute fracture or traumatic malalignment of the cervical spine.  Electronically signed by: Norman Gatlin MD 11/20/2023 12:13 AM EDT RP Workstation: [id number] CT CERVICAL SPINE WO CONTRAST EXAM: CT HEAD AND CERVICAL SPINE 11/20/2023 12:07:46 AM  TECHNIQUE: CT of the head and cervical spine was performed without the administration of intravenous contrast. Multiplanar reformatted  images are provided for review. Automated exposure control, iterative reconstruction, and/or weight based adjustment of the mA/kV was utilized to reduce the radiation dose to as low as reasonably achievable.  COMPARISON: None available.  CLINICAL HISTORY: Head trauma, moderate-severe. Patient BIB [facility] c/o fall with head injury. Patient found by neighbor on ground, covered in dark red emesis. EMS reports patient vomited dark red emesis en-route. Patient arrives with lac to back of head, covered in dark red blood.  FINDINGS: CT HEAD  BRAIN AND VENTRICLES: Stable ventricular megaly. Chronic microvascular ischemia and generalized atrophy.  ORBITS: No acute abnormality.  SINUSES AND MASTOIDS: Chronic paranasal sinusitis. No mastoid effusion.  SOFT TISSUES AND SKULL: Posterior right paramidline scalp hematoma laceration. No acute skull fracture.  CT CERVICAL SPINE  BONES AND ALIGNMENT: No evidence of acute fracture or traumatic listhesis.  DEGENERATIVE CHANGES: Multilevel spondylosis and facet arthropathy. Disc space height loss and degenerative endplate changes are greatest at C6-C7 where there are moderate changes. No severe spinal canal narrowing.  SOFT TISSUES: No prevertebral soft tissue swelling.  IMPRESSION: 1. No acute intracranial abnormality. 2. Posterior right paramidline scalp hematoma and laceration. 3. No acute fracture or traumatic malalignment of the cervical spine.  Electronically signed by: Norman Gatlin MD 11/20/2023 12:13 AM EDT RP Workstation: [id number]    Pertinent GI Studies    EGD January 2023 - Esophageal ulcer with no stigmata of recent bleeding. Biopsied. - Benign-appearing esophageal stenosis. Dilated. - Non-bleeding gastric ulcers with no stigmata of bleeding. Biopsied. - Erosive gastropathy with stigmata of recent bleeding. Biopsied. - Gastroesophageal flap valve classified as Hill Grade I (prominent fold, tight to endoscope). -  Normal examined duodenum. 1. Surgical [P], gastric antrum (ulcers) - ANTRAL MUCOSA WITH REACTIVE CHANGES AND HYPEREMIA. - NO HELICOBACTER PYLORI IDENTIFIED. 2. Surgical [P], gastric body (nodules & erosions) - OXYNTIC MUCOSA WITH HYPEREMIA AND FOCAL REACTIVE CHANGES. - NO HELICOBACTER PYLORI IDENTIFIED. 3. Surgical [P], GE junction (ulcer) - GASTROESOPHAGEAL MUCOSA WITH INTESTINAL METAPLASIA CONSISTENT WITH BARRETT'S ESOPHAGUS. - NO DYSPLASIA IDENTIFIED.  Screening colonoscopy December 2014 7 diminutive sessile polyps (maximum 5 mm) were found in the ascending colon, hepatic flexure, transverse colon, splenic flexure, descending colon and sigmoid colon..  All polyps removed  Path: 5-6 small adenomas  ? If needs repeat colonoscopy given age    Past Medical History:  Diagnosis Date   Acute renal failure (HCC)    secondary to nephrotic syndrome post cath, resolved with steroids   Anxiety    Arthritis    CAD (coronary artery disease) 04/2006   LIMA to LAD, sequential SVG to first, second, and third OM's, sequential SVG to mid RCA and PDA    Depression    Diabetes mellitus without complication (HCC)    ED (erectile dysfunction)    HTN (hypertension)  Hx of adenomatous colonic polyps    Hyperlipidemia    Loop recorder: Biotronic Biomonitor II loop 07/28/2021 07/28/2021   Obesity    Stroke (HCC)    june    Past Surgical History:  Procedure Laterality Date   COLONOSCOPY W/ BIOPSIES AND POLYPECTOMY     CORONARY ARTERY BYPASS GRAFT  2008   VESSELS X6   IR CT HEAD LTD  10/02/2022   IR PERCUTANEOUS ART THROMBECTOMY/INFUSION INTRACRANIAL INC DIAG ANGIO  10/02/2022   IR US  GUIDE VASC ACCESS RIGHT  10/02/2022   LOOP RECORDER IMPLANT     RADIOLOGY WITH ANESTHESIA N/A 10/02/2022   Procedure: IR WITH ANESTHESIA;  Surgeon: Radiologist, Medication, MD;  Location: [facility] OR;  Service: Radiology;  Laterality: N/A;   UMBILICAL HERNIA REPAIR      Family History  Problem Relation Age of Onset    Heart attack Mother    Clotting disorder Father        died of renal disease   Heart disease Sister    Cancer Sister    Colon cancer Neg Hx     Prior to Admission medications   Medication Sig Start Date End Date Taking? Authorizing Provider  acebutolol  (SECTRAL ) 200 MG capsule Take 1 capsule (200 mg total) by mouth 2 (two) times daily. 02/05/23  Yes Ladona Heinz, MD  apixaban  (ELIQUIS ) 5 MG TABS tablet Take 1 tablet (5 mg total) by mouth 2 (two) times daily. 10/18/23  Yes Raulkar, Sven SQUIBB, MD  atorvastatin  (LIPITOR ) 80 MG tablet Take 1 tablet (80 mg total) by mouth daily. Patient taking differently: Take 80 mg by mouth at bedtime. 10/12/22  Yes Angiulli, Toribio PARAS, PA-C  buPROPion  (WELLBUTRIN  XL) 150 MG 24 hr tablet Take 150 mg by mouth every morning.   Yes [provider]  empagliflozin  (JARDIANCE ) 25 MG TABS tablet Take 1 tablet (25 mg total) by mouth daily. Patient taking differently: Take 25 mg by mouth at bedtime. 10/12/22  Yes Angiulli, Toribio PARAS, PA-C  escitalopram  (LEXAPRO ) 10 MG tablet Take 10 mg by mouth daily. 11/21/22  Yes [provider]  ezetimibe  (ZETIA ) 10 MG tablet Take 10 mg by mouth daily. 11/21/22  Yes [provider]  metFORMIN  (GLUCOPHAGE ) 1000 MG tablet Take 1,000 mg by mouth 2 (two) times daily with a meal.   Yes [provider]  olmesartan  (BENICAR ) 20 MG tablet Take 1 tablet (20 mg total) by mouth daily. 10/12/22  Yes Angiulli, Toribio PARAS, PA-C  tadalafil  (CIALIS ) 5 MG tablet Take 1 tablet (5 mg total) by mouth daily. Patient taking differently: Take 5 mg by mouth at bedtime. 10/12/22  Yes Angiulli, Toribio PARAS, PA-C  tamsulosin  (FLOMAX ) 0.4 MG CAPS capsule Take 1 capsule (0.4 mg total) by mouth daily after supper. Patient taking differently: Take 0.4 mg by mouth at bedtime. 10/12/22  Yes Angiulli, Toribio PARAS, PA-C  furosemide  (LASIX ) 40 MG tablet Take 1 tablet (40 mg total) by mouth daily as needed for edema. 12/28/22   Ladona Heinz, MD  mirabegron  ER  (MYRBETRIQ ) 50 MG TB24 tablet Take 1 tablet (50 mg total) by mouth daily. Patient not taking: Reported on 11/06/2023 10/12/22   Pegge Toribio PARAS, PA-C    Current Facility-Administered Medications  Medication Dose Route Frequency Provider Last Rate Last Admin   0.9 %  sodium chloride  infusion  250 mL Intravenous PRN Sundil, Subrina, MD       acebutolol  (SECTRAL ) capsule 200 mg  200 mg Oral BID Sundil, Subrina, MD  acetaminophen  (TYLENOL ) tablet 650 mg  650 mg Oral Q6H PRN Sundil, Subrina, MD       Or   acetaminophen  (TYLENOL ) suppository 650 mg  650 mg Rectal Q6H PRN Sundil, Subrina, MD       atorvastatin  (LIPITOR ) tablet 80 mg  80 mg Oral Daily Sundil, Subrina, MD       buPROPion  (WELLBUTRIN  XL) 24 hr tablet 150 mg  150 mg Oral q morning Sundil, Subrina, MD       docusate sodium  (COLACE) capsule 100 mg  100 mg Oral BID Sundil, Subrina, MD       empagliflozin  (JARDIANCE ) tablet 25 mg  25 mg Oral Daily Sundil, Subrina, MD       escitalopram  (LEXAPRO ) tablet 10 mg  10 mg Oral Daily Sundil, Subrina, MD       ezetimibe  (ZETIA ) tablet 10 mg  10 mg Oral Daily Sundil, Subrina, MD       lactated ringers  infusion   Intravenous Continuous Sundil, Subrina, MD 100 mL/hr at 11/20/23 0237 New Bag at 11/20/23 0237   lidocaine -EPINEPHrine  (XYLOCAINE  W/EPI) 2 %-1:200000 (PF) injection            mirabegron  ER (MYRBETRIQ ) tablet 50 mg  50 mg Oral Daily Sundil, Subrina, MD       ondansetron  (ZOFRAN ) tablet 4 mg  4 mg Oral Q6H PRN Sundil, Subrina, MD       Or   ondansetron  (ZOFRAN ) injection 4 mg  4 mg Intravenous Q6H PRN Sundil, Subrina, MD       pantoprazole  (PROTONIX ) injection 40 mg  40 mg Intravenous Q12H Sundil, Subrina, MD   40 mg at 11/20/23 0235   polyethylene glycol (MIRALAX  / GLYCOLAX ) packet 17 g  17 g Oral BID Sundil, Subrina, MD   17 g at 11/20/23 0239   sodium chloride  flush (NS) 0.9 % injection 3 mL  3 mL Intravenous Q12H Sundil, Subrina, MD   3 mL at 11/20/23 0235   sodium chloride  flush  (NS) 0.9 % injection 3 mL  3 mL Intravenous Q12H Sundil, Subrina, MD   3 mL at 11/20/23 0235   sodium chloride  flush (NS) 0.9 % injection 3 mL  3 mL Intravenous PRN Sundil, Subrina, MD       tamsulosin  (FLOMAX ) capsule 0.4 mg  0.4 mg Oral QPC supper Sundil, Subrina, MD       Current Outpatient Medications  Medication Sig Dispense Refill   acebutolol  (SECTRAL ) 200 MG capsule Take 1 capsule (200 mg total) by mouth 2 (two) times daily. 180 capsule 1   apixaban  (ELIQUIS ) 5 MG TABS tablet Take 1 tablet (5 mg total) by mouth 2 (two) times daily. 60 tablet 0   atorvastatin  (LIPITOR ) 80 MG tablet Take 1 tablet (80 mg total) by mouth daily. (Patient taking differently: Take 80 mg by mouth at bedtime.) 30 tablet 0   buPROPion  (WELLBUTRIN  XL) 150 MG 24 hr tablet Take 150 mg by mouth every morning.     empagliflozin  (JARDIANCE ) 25 MG TABS tablet Take 1 tablet (25 mg total) by mouth daily. (Patient taking differently: Take 25 mg by mouth at bedtime.) 30 tablet 0   escitalopram  (LEXAPRO ) 10 MG tablet Take 10 mg by mouth daily.     ezetimibe  (ZETIA ) 10 MG tablet Take 10 mg by mouth daily.     metFORMIN  (GLUCOPHAGE ) 1000 MG tablet Take 1,000 mg by mouth 2 (two) times daily with a meal.     olmesartan  (BENICAR ) 20 MG tablet Take 1 tablet (  20 mg total) by mouth daily. 30 tablet 0   tadalafil  (CIALIS ) 5 MG tablet Take 1 tablet (5 mg total) by mouth daily. (Patient taking differently: Take 5 mg by mouth at bedtime.) 10 tablet 0   tamsulosin  (FLOMAX ) 0.4 MG CAPS capsule Take 1 capsule (0.4 mg total) by mouth daily after supper. (Patient taking differently: Take 0.4 mg by mouth at bedtime.) 30 capsule 0   furosemide  (LASIX ) 40 MG tablet Take 1 tablet (40 mg total) by mouth daily as needed for edema.     mirabegron  ER (MYRBETRIQ ) 50 MG TB24 tablet Take 1 tablet (50 mg total) by mouth daily. (Patient not taking: Reported on 11/06/2023) 30 tablet 0    Allergies as of 11/19/2023 - Review Complete 11/19/2023  Allergen  Reaction Noted   Ketorolac tromethamine Other (See Comments) 06/17/2020    Social History   Socioeconomic History   Marital status: Widowed    Spouse name: Not on file   Number of children: Not on file   Years of education: Not on file   Highest education level: Not on file  Occupational History   Occupation: Field seismologist  Tobacco Use   Smoking status: Never   Smokeless tobacco: Never  Vaping Use   Vaping status: Never Used  Substance and Sexual Activity   Alcohol  use: No    Alcohol /week: 1.0 standard drink of alcohol     Types: 1 Glasses of wine per week   Drug use: No   Sexual activity: Not on file  Other Topics Concern   Not on file  Social History Narrative   Retired Surveyor, minerals.  Widowed since 2014.  He has multiple children.  Never smoker 5 caffeinated beverages daily and no alcohol    Social Drivers of Corporate investment banker Strain: Not on file  Food Insecurity: Not on file  Transportation Needs: Not on file  Physical Activity: Not on file  Stress: Not on file  Social Connections: Not on file  Intimate Partner Violence: Not on file     Code Status   Code Status: Full Code  Review of Systems: All systems reviewed and negative except where noted in HPI.  Physical Exam: Vital signs in last 24 hours: Temp:  [97.8 F (36.6 C)-97.9 F (36.6 C)] 97.8 F (36.6 C) (08/04 0442) Pulse Rate:  [60-83] 73 (08/04 0500) Resp:  [9-25] 18 (08/04 0500) BP: (131-199)/(79-102) 141/85 (08/04 0500) SpO2:  [91 %-100 %] 97 % (08/04 0500) Weight:  [76 kg] 76 kg (08/03 2308)    General:  Pleasant male in NAD Psych:  Cooperative. Normal mood and affect Head: Sutures in back of head Eyes: Pupils equal Ears:  Normal auditory acuity Nose: No deformity, discharge or lesions Neck:  Supple, no masses felt Lungs:  Clear to auscultation.  Heart:  Regular rate, regular rhythm.  Abdomen:  Soft, nondistended, nontender, active bowel sounds, no masses felt Rectal :   Deferred. Incontinent of soft brown stool Msk: Symmetrical without gross deformities.  Neurologic:  Alert, oriented, grossly normal neurologically Extremities : No edema Skin:  Intact without significant lesions.    Intake/Output from previous day: 08/03 0701 - 08/04 0700 In: 1062.1 [I.V.:62.1; IV Piggyback:1000] Out: -  Intake/Output this shift:  No intake/output data recorded.   Vina Dasen, NP-C   11/20/2023, 8:34 AM

## 2023-11-20 NOTE — Anesthesia Postprocedure Evaluation (Signed)
 Anesthesia Post Note  Patient: [name]  Procedure(s) Performed: EGD (ESOPHAGOGASTRODUODENOSCOPY)     Patient location during evaluation: PACU Anesthesia Type: MAC Level of consciousness: oriented, patient cooperative and sedated Pain management: pain level controlled Vital Signs Assessment: post-procedure vital signs reviewed and stable Respiratory status: spontaneous breathing, nonlabored ventilation, respiratory function stable and patient connected to nasal cannula oxygen Cardiovascular status: blood pressure returned to baseline and stable Postop Assessment: no apparent nausea or vomiting Anesthetic complications: no   There were no known notable events for this encounter.  Last Vitals:  Vitals:   11/20/23 1040 11/20/23 1050  BP: (!) 103/58 113/65  Pulse: 74 76  Resp: 14 13  Temp:    SpO2: 97% 96%    Last Pain:  Vitals:   11/20/23 1040  TempSrc:   PainSc: Asleep                 Rayansh Herbst,E. Tascha Casares

## 2023-11-21 ENCOUNTER — Inpatient Hospital Stay (HOSPITAL_COMMUNITY)

## 2023-11-21 ENCOUNTER — Encounter (HOSPITAL_COMMUNITY): Payer: Self-pay | Admitting: Internal Medicine

## 2023-11-21 ENCOUNTER — Inpatient Hospital Stay (HOSPITAL_COMMUNITY): Admitting: Anesthesiology

## 2023-11-21 ENCOUNTER — Encounter (HOSPITAL_COMMUNITY)
Admission: EM | Disposition: A | Discharge status: Home Health | Payer: Self-pay | Source: Home / Self Care | Attending: Internal Medicine

## 2023-11-21 DIAGNOSIS — K3189 Other diseases of stomach and duodenum: Secondary | ICD-10-CM | POA: Diagnosis not present

## 2023-11-21 DIAGNOSIS — I2510 Atherosclerotic heart disease of native coronary artery without angina pectoris: Secondary | ICD-10-CM

## 2023-11-21 DIAGNOSIS — I679 Cerebrovascular disease, unspecified: Secondary | ICD-10-CM | POA: Diagnosis not present

## 2023-11-21 DIAGNOSIS — K226 Gastro-esophageal laceration-hemorrhage syndrome: Secondary | ICD-10-CM

## 2023-11-21 DIAGNOSIS — K920 Hematemesis: Secondary | ICD-10-CM | POA: Diagnosis not present

## 2023-11-21 DIAGNOSIS — I480 Paroxysmal atrial fibrillation: Secondary | ICD-10-CM | POA: Diagnosis not present

## 2023-11-21 DIAGNOSIS — K2970 Gastritis, unspecified, without bleeding: Secondary | ICD-10-CM

## 2023-11-21 DIAGNOSIS — F418 Other specified anxiety disorders: Secondary | ICD-10-CM | POA: Diagnosis not present

## 2023-11-21 DIAGNOSIS — E119 Type 2 diabetes mellitus without complications: Secondary | ICD-10-CM | POA: Diagnosis not present

## 2023-11-21 DIAGNOSIS — F411 Generalized anxiety disorder: Secondary | ICD-10-CM

## 2023-11-21 DIAGNOSIS — W19XXXA Unspecified fall, initial encounter: Secondary | ICD-10-CM | POA: Diagnosis not present

## 2023-11-21 HISTORY — PX: ESOPHAGOGASTRODUODENOSCOPY: SHX5428

## 2023-11-21 HISTORY — PX: BIOPSY OF SKIN SUBCUTANEOUS TISSUE AND/OR MUCOUS MEMBRANE: SHX6741

## 2023-11-21 LAB — CBC
HCT: 33.5 % — ABNORMAL LOW (ref 39.0–52.0)
Hemoglobin: 11.3 g/dL — ABNORMAL LOW (ref 13.0–17.0)
MCH: 32.9 pg (ref 26.0–34.0)
MCHC: 33.7 g/dL (ref 30.0–36.0)
MCV: 97.7 fL (ref 80.0–100.0)
Platelets: 241 K/uL (ref 150–400)
RBC: 3.43 MIL/uL — ABNORMAL LOW (ref 4.22–5.81)
RDW: 14 % (ref 11.5–15.5)
WBC: 9.9 K/uL (ref 4.0–10.5)
nRBC: 0 % (ref 0.0–0.2)

## 2023-11-21 LAB — COMPREHENSIVE METABOLIC PANEL WITH GFR
ALT: 19 U/L (ref 0–44)
AST: 23 U/L (ref 15–41)
Albumin: 2.4 g/dL — ABNORMAL LOW (ref 3.5–5.0)
Alkaline Phosphatase: 58 U/L (ref 38–126)
Anion gap: 7 (ref 5–15)
BUN: 21 mg/dL (ref 8–23)
CO2: 27 mmol/L (ref 22–32)
Calcium: 8.4 mg/dL — ABNORMAL LOW (ref 8.9–10.3)
Chloride: 101 mmol/L (ref 98–111)
Creatinine, Ser: 1.31 mg/dL — ABNORMAL HIGH (ref 0.61–1.24)
GFR, Estimated: 53 mL/min — ABNORMAL LOW (ref >60)
Glucose, Bld: 80 mg/dL (ref 70–99)
Potassium: 4.1 mmol/L (ref 3.5–5.1)
Sodium: 135 mmol/L (ref 135–145)
Total Bilirubin: 1 mg/dL (ref 0.0–1.2)
Total Protein: 5.1 g/dL — ABNORMAL LOW (ref 6.5–8.1)

## 2023-11-21 LAB — GLUCOSE, CAPILLARY
Glucose-Capillary: 81 mg/dL (ref 70–99)
Glucose-Capillary: 71 mg/dL (ref 70–99)
Glucose-Capillary: 166 mg/dL — ABNORMAL HIGH (ref 70–99)
Glucose-Capillary: 193 mg/dL — ABNORMAL HIGH (ref 70–99)
Glucose-Capillary: 258 mg/dL — ABNORMAL HIGH (ref 70–99)

## 2023-11-21 LAB — VITAMIN B12: Vitamin B-12: 298 pg/mL (ref 180–914)

## 2023-11-21 LAB — HIV ANTIBODY (ROUTINE TESTING W REFLEX): HIV Screen 4th Generation wRfx: NONREACTIVE

## 2023-11-21 LAB — TSH: TSH: 1.169 u[IU]/mL (ref 0.350–4.500)

## 2023-11-21 LAB — MRSA NEXT GEN BY PCR, NASAL: MRSA by PCR Next Gen: DETECTED — AB

## 2023-11-21 SURGERY — EGD (ESOPHAGOGASTRODUODENOSCOPY)
Anesthesia: Monitor Anesthesia Care

## 2023-11-21 MED ORDER — SODIUM CHLORIDE 0.9 % IV SOLN: INTRAVENOUS | Status: DC

## 2023-11-21 MED ORDER — OXYCODONE HCL 5 MG/5ML PO SOLN: 5.0000 mg | Freq: Once | ORAL | Status: DC | PRN

## 2023-11-21 MED ORDER — SODIUM CHLORIDE 0.9 % IV SOLN
INTRAVENOUS | Status: AC | PRN
  Administered 2023-11-21: 250 mL via INTRAMUSCULAR

## 2023-11-21 MED ORDER — PROPOFOL 500 MG/50ML IV EMUL
INTRAVENOUS | Status: DC | PRN
  Administered 2023-11-21: 180 ug/kg/min via INTRAVENOUS

## 2023-11-21 MED ORDER — PHENYLEPHRINE HCL (PRESSORS) 10 MG/ML IV SOLN
INTRAVENOUS | Status: DC | PRN
  Administered 2023-11-21: 120 ug via INTRAVENOUS

## 2023-11-21 MED ORDER — ONDANSETRON HCL 4 MG/2ML IJ SOLN: 4.0000 mg | Freq: Once | INTRAMUSCULAR | Status: DC | PRN

## 2023-11-21 MED ORDER — INSULIN ASPART 100 UNIT/ML IJ SOLN
0.0000 [IU] | Freq: Three times a day (TID) | INTRAMUSCULAR | Status: DC
  Administered 2023-11-21: 2 [IU] via SUBCUTANEOUS
  Administered 2023-11-22: 3 [IU] via SUBCUTANEOUS

## 2023-11-21 MED ORDER — PANTOPRAZOLE SODIUM 40 MG PO TBEC
40.0000 mg | DELAYED_RELEASE_TABLET | Freq: Every day | ORAL | Status: DC
  Administered 2023-11-22: 40 mg via ORAL
  Filled 2023-11-21: qty 1

## 2023-11-21 MED ORDER — OXYCODONE HCL 5 MG PO TABS: 5.0000 mg | ORAL_TABLET | Freq: Once | ORAL | Status: DC | PRN

## 2023-11-21 MED ORDER — ACETAMINOPHEN 10 MG/ML IV SOLN: 1000.0000 mg | Freq: Once | INTRAVENOUS | Status: DC | PRN

## 2023-11-21 MED ORDER — FENTANYL CITRATE (PF) 100 MCG/2ML IJ SOLN: 25.0000 ug | INTRAMUSCULAR | Status: DC | PRN

## 2023-11-21 MED ORDER — EPHEDRINE SULFATE-NACL 50-0.9 MG/10ML-% IV SOSY
PREFILLED_SYRINGE | INTRAVENOUS | Status: DC | PRN
  Administered 2023-11-21: 7.5 mg via INTRAVENOUS

## 2023-11-21 NOTE — H&P (Signed)
 GASTROENTEROLOGY PROCEDURE H&P NOTE   Primary Care Physician: Loreli Elsie JONETTA Mickey., MD    Reason for Procedure:  Recent hematemesis  Plan:    Repeat EGD due to food in stomach yesterday  The nature of the procedure, as well as the risks, benefits, and alternatives were carefully and thoroughly reviewed with the patient. Ample time for discussion and questions allowed. The patient understood, was satisfied, and agreed to proceed.     HPI: [name] is a 89 y.o. male who presents for EGD.  Medical history as below.   No further vomiting.  No chest pain or dyspnea this morning.  No abdominal pain.   Past Medical History:  Diagnosis Date   Acute renal failure (HCC)    secondary to nephrotic syndrome post cath, resolved with steroids   Anxiety    Arthritis    CAD (coronary artery disease) 04/2006   LIMA to LAD, sequential SVG to first, second, and third OM's, sequential SVG to mid RCA and PDA    Depression    Diabetes mellitus without complication [facility])    ED (erectile dysfunction)    HTN (hypertension)    Hx of adenomatous colonic polyps    Hyperlipidemia    Loop recorder: Biotronic Biomonitor II loop 07/28/2021 07/28/2021   Obesity    Stroke (HCC)    june    Past Surgical History:  Procedure Laterality Date   COLONOSCOPY W/ BIOPSIES AND POLYPECTOMY     CORONARY ARTERY BYPASS GRAFT  2008   VESSELS X6   IR CT HEAD LTD  10/02/2022   IR PERCUTANEOUS ART THROMBECTOMY/INFUSION INTRACRANIAL INC DIAG ANGIO  10/02/2022   IR US  GUIDE VASC ACCESS RIGHT  10/02/2022   LOOP RECORDER IMPLANT     RADIOLOGY WITH ANESTHESIA N/A 10/02/2022   Procedure: IR WITH ANESTHESIA;  Surgeon: Radiologist, Medication, MD;  Location: [facility] OR;  Service: Radiology;  Laterality: N/A;   UMBILICAL HERNIA REPAIR      Prior to Admission medications   Medication Sig Start Date End Date Taking? Authorizing Provider  acebutolol  (SECTRAL ) 200 MG capsule Take 1 capsule (200 mg total) by mouth 2 (two) times  daily. 02/05/23  Yes Ladona Heinz, MD  apixaban  (ELIQUIS ) 5 MG TABS tablet Take 1 tablet (5 mg total) by mouth 2 (two) times daily. 10/18/23  Yes Raulkar, Sven SQUIBB, MD  atorvastatin  (LIPITOR ) 40 MG tablet Take 40 mg by mouth every evening.   Yes [provider]  buPROPion  (WELLBUTRIN  XL) 150 MG 24 hr tablet Take 150 mg by mouth every morning.   Yes [provider]  empagliflozin  (JARDIANCE ) 25 MG TABS tablet Take 1 tablet (25 mg total) by mouth daily. Patient taking differently: Take 25 mg by mouth at bedtime. 10/12/22  Yes Angiulli, Toribio PARAS, PA-C  escitalopram  (LEXAPRO ) 10 MG tablet Take 10 mg by mouth daily. 11/21/22  Yes [provider]  ezetimibe  (ZETIA ) 10 MG tablet Take 10 mg by mouth daily. 11/21/22  Yes [provider]  furosemide  (LASIX ) 40 MG tablet Take 1 tablet (40 mg total) by mouth daily as needed for edema. 12/28/22  Yes Ladona Heinz, MD  insulin  degludec (TRESIBA FLEXTOUCH) 200 UNIT/ML FlexTouch Pen Inject 20 Units into the skin as needed (if BS is above 200).   Yes [provider]  metFORMIN  (GLUCOPHAGE ) 1000 MG tablet Take 1,000 mg by mouth 2 (two) times daily with a meal.   Yes [provider]  olmesartan  (BENICAR ) 20 MG tablet Take 1  tablet (20 mg total) by mouth daily. 10/12/22  Yes Angiulli, Toribio PARAS, PA-C  tadalafil  (CIALIS ) 5 MG tablet Take 1 tablet (5 mg total) by mouth daily. Patient taking differently: Take 5 mg by mouth at bedtime. 10/12/22  Yes Angiulli, Toribio PARAS, PA-C  tamsulosin  (FLOMAX ) 0.4 MG CAPS capsule Take 1 capsule (0.4 mg total) by mouth daily after supper. Patient taking differently: Take 0.4 mg by mouth at bedtime. 10/12/22  Yes Angiulli, Toribio PARAS, PA-C  atorvastatin  (LIPITOR ) 80 MG tablet Take 1 tablet (80 mg total) by mouth daily. Patient not taking: Reported on 11/20/2023 10/12/22   Angiulli, Toribio PARAS, PA-C  cefadroxil  (DURICEF) 500 MG capsule Take 500 mg by mouth 2 (two) times daily. Patient not taking: Reported on  11/20/2023    [provider]  mirabegron  ER (MYRBETRIQ ) 50 MG TB24 tablet Take 1 tablet (50 mg total) by mouth daily. Patient not taking: Reported on 11/06/2023 10/12/22   Pegge Toribio PARAS, PA-C    Current Facility-Administered Medications  Medication Dose Route Frequency Provider Last Rate Last Admin   0.9 %  sodium chloride  infusion   Intravenous Continuous Amol Domanski, Gordy HERO, MD   Stopped at 11/21/23 0703   0.9 %  sodium chloride  infusion    Continuous PRN Albertus Gordy HERO, MD 10 mL/hr at 11/21/23 0807 250 mL at 11/21/23 9192   acebutolol  (SECTRAL ) capsule 200 mg  200 mg Oral BID Sundil, Subrina, MD   200 mg at 11/20/23 2151   acetaminophen  (TYLENOL ) tablet 650 mg  650 mg Oral Q6H PRN Sundil, Subrina, MD       Or   acetaminophen  (TYLENOL ) suppository 650 mg  650 mg Rectal Q6H PRN Sundil, Subrina, MD       amLODipine  (NORVASC ) tablet 5 mg  5 mg Oral Daily Krishnan, Gokul, MD       atorvastatin  (LIPITOR ) tablet 80 mg  80 mg Oral Daily Sundil, Subrina, MD       buPROPion  (WELLBUTRIN  XL) 24 hr tablet 150 mg  150 mg Oral q morning Sundil, Subrina, MD       docusate sodium  (COLACE) capsule 100 mg  100 mg Oral BID Sundil, Subrina, MD   100 mg at 11/20/23 2151   empagliflozin  (JARDIANCE ) tablet 25 mg  25 mg Oral Daily Sundil, Subrina, MD       escitalopram  (LEXAPRO ) tablet 10 mg  10 mg Oral Daily Sundil, Subrina, MD       ezetimibe  (ZETIA ) tablet 10 mg  10 mg Oral Daily Sundil, Subrina, MD       ondansetron  (ZOFRAN ) tablet 4 mg  4 mg Oral Q6H PRN Sundil, Subrina, MD       Or   ondansetron  (ZOFRAN ) injection 4 mg  4 mg Intravenous Q6H PRN Sundil, Subrina, MD   4 mg at 11/20/23 1404   pantoprazole  (PROTONIX ) injection 40 mg  40 mg Intravenous Q12H Sundil, Subrina, MD   40 mg at 11/20/23 2151   polyethylene glycol (MIRALAX  / GLYCOLAX ) packet 17 g  17 g Oral BID Sundil, Subrina, MD   17 g at 11/20/23 2151   sodium chloride  flush (NS) 0.9 % injection 3 mL  3 mL Intravenous Q12H Sundil, Subrina, MD   3  mL at 11/20/23 0235   sodium chloride  flush (NS) 0.9 % injection 3 mL  3 mL Intravenous Q12H Sundil, Subrina, MD   3 mL at 11/20/23 2151   sodium chloride  flush (NS) 0.9 % injection 3 mL  3 mL Intravenous PRN  Sundil, Subrina, MD       sodium zirconium cyclosilicate  (LOKELMA ) packet 10 g  10 g Oral Once Krishnan, Gokul, MD       tamsulosin  (FLOMAX ) capsule 0.4 mg  0.4 mg Oral QPC supper Sundil, Subrina, MD   0.4 mg at 11/20/23 1717    Allergies as of 11/19/2023 - Review Complete 11/19/2023  Allergen Reaction Noted   Ketorolac tromethamine Other (See Comments) 06/17/2020    Family History  Problem Relation Age of Onset   Heart attack Mother    Clotting disorder Father        died of renal disease   Heart disease Sister    Cancer Sister    Colon cancer Neg Hx     Social History   Socioeconomic History   Marital status: Widowed    Spouse name: Not on file   Number of children: Not on file   Years of education: Not on file   Highest education level: Not on file  Occupational History   Occupation: Field seismologist  Tobacco Use   Smoking status: Never   Smokeless tobacco: Never  Vaping Use   Vaping status: Never Used  Substance and Sexual Activity   Alcohol  use: No    Alcohol /week: 1.0 standard drink of alcohol     Types: 1 Glasses of wine per week   Drug use: No   Sexual activity: Not on file  Other Topics Concern   Not on file  Social History Narrative   Retired Surveyor, minerals.  Widowed since 2014.  He has multiple children.  Never smoker 5 caffeinated beverages daily and no alcohol    Social Drivers of Corporate investment banker Strain: Not on file  Food Insecurity: No Food Insecurity (11/20/2023)   Hunger Vital Sign    Worried About Running Out of Food in the Last Year: Never true    Ran Out of Food in the Last Year: Never true  Transportation Needs: No Transportation Needs (11/20/2023)   PRAPARE - Administrator, Civil Service (Medical): No    Lack of  Transportation (Non-Medical): No  Physical Activity: Not on file  Stress: Not on file  Social Connections: Moderately Isolated (11/20/2023)   Social Connection and Isolation Panel    Frequency of Communication with Friends and Family: Three times a week    Frequency of Social Gatherings with Friends and Family: Once a week    Attends Religious Services: 1 to 4 times per year    Active Member of [employer] or Organizations: No    Attends Banker Meetings: Never    Marital Status: Widowed  Intimate Partner Violence: Not At Risk (11/20/2023)   Humiliation, Afraid, Rape, and Kick questionnaire    Fear of Current or Ex-Partner: No    Emotionally Abused: No    Physically Abused: No    Sexually Abused: No    Physical Exam: Vital signs in last 24 hours: @BP  (!) 144/65   Pulse 64   Temp 97.6 F (36.4 C) (Temporal)   Resp 18   Ht 5' 9 (1.753 m)   Wt 77.6 kg   SpO2 94%   BMI 25.25 kg/m  GEN: NAD EYE: Sclerae anicteric ENT: MMM CV: Non-tachycardic Pulm: CTA b/l GI: Soft, NT/ND NEURO:  Alert & Oriented x 3   Gordy Starch, MD [facility]  11/21/2023 8:27 AM

## 2023-11-21 NOTE — Plan of Care (Signed)
   Problem: Education: Goal: Knowledge of General Education information will improve Description: Including pain rating scale, medication(s)/side effects and non-pharmacologic comfort measures Outcome: Progressing   Problem: Health Behavior/Discharge Planning: Goal: Ability to manage health-related needs will improve Outcome: Progressing   Problem: Clinical Measurements: Goal: Ability to maintain clinical measurements within normal limits will improve Outcome: Progressing   Problem: Activity: Goal: Risk for activity intolerance will decrease Outcome: Progressing   Problem: Nutrition: Goal: Adequate nutrition will be maintained Outcome: Progressing   Problem: Coping: Goal: Level of anxiety will decrease Outcome: Progressing   Problem: Safety: Goal: Ability to remain free from injury will improve Outcome: Progressing

## 2023-11-21 NOTE — Anesthesia Preprocedure Evaluation (Signed)
 Anesthesia Evaluation  Patient identified by MRN, date of birth, ID band Patient awake    Reviewed: Allergy & Precautions, NPO status , Patient's Chart, lab work & pertinent test results, reviewed documented beta blocker date and time   History of Anesthesia Complications Negative for: history of anesthetic complications  Airway Mallampati: III  TM Distance: >3 FB     Dental  (+) Edentulous Upper,    Pulmonary sleep apnea    breath sounds clear to auscultation       Cardiovascular hypertension, + CAD   Rhythm:Regular Rate:Normal   1. Left ventricular ejection fraction, by estimation, is 60 to 65%. The  left ventricle has normal function. The left ventricle has no regional  wall motion abnormalities. Left ventricular diastolic parameters are  indeterminate.   2. Right ventricular systolic function is mildly reduced. The right  ventricular size is normal. Tricuspid regurgitation signal is inadequate  for assessing PA pressure. The estimated right ventricular systolic  pressure is 37.8 mmHg.   3. Left atrial size was mild to moderately dilated.   4. The mitral valve is degenerative. Mild mitral valve regurgitation. No  evidence of mitral stenosis.   5. The aortic valve is grossly normal. There is mild calcification of the  aortic valve. There is mild thickening of the aortic valve. Aortic valve  regurgitation is not visualized. No aortic stenosis is present.   6. The inferior vena cava is dilated in size with <50% respiratory  variability, suggesting right atrial pressure of 15 mmHg.      Neuro/Psych  PSYCHIATRIC DISORDERS Anxiety Depression    CVA    GI/Hepatic No n/v today   Endo/Other  diabetes    Renal/GU Renal disease     Musculoskeletal  (+) Arthritis ,    Abdominal   Peds  Hematology  (+) Blood dyscrasia (hgb 11.3), anemia   Anesthesia Other Findings   Reproductive/Obstetrics                               Anesthesia Physical Anesthesia Plan  ASA: 3  Anesthesia Plan: MAC   Post-op Pain Management:    Induction:   PONV Risk Score and Plan: 1 and Ondansetron  and Propofol  infusion  Airway Management Planned: Natural Airway and Nasal Cannula  Additional Equipment:   Intra-op Plan:   Post-operative Plan:   Informed Consent: I have reviewed the patients History and Physical, chart, labs and discussed the procedure including the risks, benefits and alternatives for the proposed anesthesia with the patient or authorized representative who has indicated his/her understanding and acceptance.     Dental advisory given  Plan Discussed with: CRNA  Anesthesia Plan Comments:          Anesthesia Quick Evaluation

## 2023-11-21 NOTE — Op Note (Signed)
 [facility] Patient Name: [name] Procedure Date : 11/21/2023 MRN: [id number] Attending MD: Gordy CHRISTELLA Starch , MD, [id number] Date of Birth: 1935-04-15 CSN: [id number] Age: 89 Admit Type: Inpatient Procedure:                Upper GI endoscopy Indications:              Hematemesis Providers:                Gordy CHRISTELLA. Starch, MD, Gregoria Pierce, RN Referring MD:             [facility] Medicines:                Monitored Anesthesia Care Complications:            No immediate complications. Estimated Blood Loss:     Estimated blood loss: none. Procedure:                Pre-Anesthesia Assessment:                           - Prior to the procedure, a History and Physical                            was performed, and patient medications and                            allergies were reviewed. The patient's tolerance of                            previous anesthesia was also reviewed. The risks                            and benefits of the procedure and the sedation                            options and risks were discussed with the patient.                            All questions were answered, and informed consent                            was obtained. Prior Anticoagulants: The patient has                            taken Eliquis  (apixaban ), last dose was 2 days                            prior to procedure. ASA Grade Assessment: III - A                            patient with severe systemic disease. After                            reviewing the risks and benefits, the patient was  deemed in satisfactory condition to undergo the                            procedure.                           After obtaining informed consent, the endoscope was                            passed under direct vision. Throughout the                            procedure, the patient's blood pressure, pulse, and                            oxygen  saturations were monitored continuously. The                            [id number]-[id number] ([id number]) Olympus endoscope was introduced                            through the mouth, and advanced to the second part                            of duodenum. The upper GI endoscopy was                            accomplished without difficulty. The patient                            tolerated the procedure well. Scope In: Scope Out: Findings:      The examined esophagus was normal.      A diminutive non-bleeding Mallory-Weiss tear was found in the gastric       cardia (immediately distal to the Z-line). This is the most likely       source of recent hematemesis.      Nodular mucosa with scattered erosions was found in the gastric incisura       and antrum. Biopsies were taken with a cold forceps for histology.      The examined duodenum was normal. Impression:               - Normal esophagus.                           - Mallory-Weiss tear (small) in the gastric cardia                            (immediately distal to the Z-line).                           - Nodular mucosa with scattered mucosal erosions in                            the gastric antrum. Biopsied.                           -  Normal examined duodenum. Moderate Sedation:      N/A Recommendation:           - Return patient to hospital ward for ongoing care.                           - Advance diet as tolerated.                           - Continue present medications. Okay to resume DOAC                            today or tomorrow. Daily PPI is recommended now and                            on long-term basis in setting of gastritis and                            anticoagulation.                           - Await pathology results.                           - GI will follow-up biopsy results and sign off,                            call if questions. Procedure Code(s):        --- Professional ---                           [phone],  Esophagogastroduodenoscopy, flexible,                            transoral; with biopsy, single or multiple Diagnosis Code(s):        --- Professional ---                           K22.6, Gastro-esophageal laceration-hemorrhage                            syndrome                           K31.89, Other diseases of stomach and duodenum                           K92.0, Hematemesis CPT copyright 2022 American Medical Association. All rights reserved. The codes documented in this report are preliminary and upon coder review may  be revised to meet current compliance requirements. Gordy CHRISTELLA Starch, MD 11/21/2023 9:32:02 AM This report has been signed electronically. Number of Addenda: 0

## 2023-11-21 NOTE — Procedures (Addendum)
 I spoke to the patient's daughter Sonny by phone after the procedure Updated her on findings and plan/recommendations Defer to hospitalist medicine on discharge  Time provided for questions and answers and she thanked me for the call

## 2023-11-21 NOTE — TOC Initial Note (Signed)
 Transition of Care [facility]) - Initial/Assessment Note    Patient Details  Name: [name] MRN: [id number] Date of Birth: [birth year]  [facility] CM/SW Contact:    Marval Gell, RN Phone Number: 11/21/2023, 8:52 AM  Clinical Narrative:                  Per chart review Patient admitted from [facility].  Fall at home ICM will continue to follow for DC planning.   Expected Discharge Plan:  (TBD) Barriers to Discharge: Continued Medical Work up   Patient Goals and CMS Choice            Expected Discharge Plan and Services                                              Prior Living Arrangements/Services                       Activities of Daily Living      Permission Sought/Granted                  Emotional Assessment              Admission diagnosis:  Hematemesis [K92.0] Injury of head, initial encounter [S09.90XA] Fall, initial encounter [W19.XXXA] Laceration of scalp, initial encounter [S01.01XA] Hematemesis, unspecified whether nausea present [K92.0] Patient Active Problem List   Diagnosis Date Noted   Hematemesis 11/20/2023   History of unsteady gait 11/20/2023   Fall at home, initial encounter 11/20/2023   History of CAD (coronary artery disease) 11/20/2023   CKD stage 3b, GFR 30-44 ml/min (HCC) 11/20/2023   Lactic acidosis 11/20/2023   GAD (generalized anxiety disorder) 11/20/2023   Scalp laceration, initial encounter 11/20/2023   SIRS (systemic inflammatory response syndrome) (HCC) 11/05/2023   Generalized weakness 11/05/2023   Paroxysmal atrial fibrillation (HCC) 11/05/2023   Non-insulin  dependent type 2 diabetes mellitus (HCC) 11/05/2023   Essential hypertension 11/05/2023   History of CVA (cerebrovascular accident) 11/05/2023   Mild cognitive impairment 11/05/2023   Ischemic cerebrovascular accident (CVA) (HCC) 10/06/2022   Status post surgery 10/02/2022   Stroke: Multiple scattered bilateral  ischemic infarcts s/p thrombectomy of left P1/PCA TICI 3 Etiology: Likely cardioembolic 10/02/2022   Hemianopsia 10/01/2022   Right homonymous hemianopsia 10/01/2022   Loop recorder: Biotronic Biomonitor II loop 07/28/2021 07/28/2021   Unsteady gait 08/05/2020   BPH (benign prostatic hyperplasia) 01/21/2020   Depression with anxiety 02/02/2018   Right hip pain 10/02/2017   Recurrent major depression in remission (HCC) 01/27/2017   Chronic kidney disease, stage 3a (HCC) 07/17/2015   History of colonic polyps 03/26/2013   Obesity 10/04/2012   OBSTRUCTIVE SLEEP APNEA 10/30/2008   Hyperlipidemia 07/01/2008   HYPERTENSION, BENIGN 07/01/2008   CAD, NATIVE VESSEL 07/01/2008   FATIGUE / MALAISE 07/01/2008   EDEMA 07/01/2008   PCP:  Loreli Elsie JONETTA Mickey., MD Pharmacy:   [facility] [address], [place] - [address] [place] [address] Phone: [phone] Fax: [phone]  [facility] [address] [place] [address] Phone: [phone] Fax: [phone]  [facility] [address] [place] [address] Phone: [phone] Fax: [phone]  [facility] [address] [place] [address] Phone: [phone] Fax: [phone]  Social Drivers of Health (SDOH) Social History: SDOH Screenings   Food Insecurity: No Food Insecurity (11/20/2023)  Housing: Low Risk  (11/20/2023)  Transportation Needs: No Transportation Needs (11/20/2023)  Utilities: Not At Risk (11/20/2023)  Depression (PHQ2-9): Medium Risk (03/20/2023)  Social Connections: Moderately Isolated (11/20/2023)  Tobacco Use: Low Risk  (11/21/2023)   SDOH Interventions:     Readmission Risk Interventions     No data to display

## 2023-11-21 NOTE — Progress Notes (Signed)
 Physical Therapy Treatment Patient Details Name: [name] MRN: [id number] DOB: 1934-06-27 Today's Date: 11/21/2023   History of Present Illness Pt is an 89 y/o male presenting to the ER via EMS for unwitnessed fall.  This patient was found on the ground for an unknown period of time with injury to the back of his hed as well as dark appearing emesis,  PMH includes: ARF, CABG, anxiety, DM, HTN mild cognitive impairment.    PT Comments  Pt with slowed progression toward goals limited by incoordination and decreased balanced.  Emphasis on transitions to EOB with observation for signs of vertigo and BPPV.  No signs leading toward BPPV.  Emphasis on sit to stands and progression of gait stability/quality with a RW.    If plan is discharge home, recommend the following: A little help with walking and/or transfers;A little help with bathing/dressing/bathroom;Assistance with cooking/housework;Assist for transportation   Can travel by private vehicle        Equipment Recommendations  None recommended by PT    Recommendations for Other Services       Precautions / Restrictions Precautions Precautions: Fall Recall of Precautions/Restrictions: Impaired Restrictions Weight Bearing Restrictions Per Provider Order: No     Mobility  Bed Mobility Overal bed mobility: Needs Assistance Bed Mobility: Supine to Sit, Sit to Supine Rolling: Min assist Sidelying to sit: Min assist   Sit to supine: Min assist   General bed mobility comments: min A in/out of bed.  mild posterior bias, much improved from yesterday eval    Transfers Overall transfer level: Needs assistance Equipment used: Rolling walker (2 wheels) Transfers: Sit to/from Stand, Bed to chair/wheelchair/BSC Sit to Stand: Min assist   Step pivot transfers: Min assist       General transfer comment: min A for safety, poor overall balance    Ambulation/Gait Ambulation/Gait assistance: Min assist Gait Distance (Feet):  110 Feet Assistive device: Rolling walker (2 wheels) Gait Pattern/deviations: Step-through pattern   Gait velocity interpretation: <1.31 ft/sec, indicative of household ambulator   General Gait Details: generally unsteady gait with drift and stagger R, seen especially when in curved hall to the L.  Turns also mildly staggered with pt outside of the walker.   Stairs             Wheelchair Mobility     Tilt Bed    Modified Rankin (Stroke Patients Only)       Balance Overall balance assessment: Needs assistance Sitting-balance support: No upper extremity supported, Feet supported Sitting balance-Leahy Scale: Fair Sitting balance - Comments: able to sit EOB statically, when leaning or performing ADLs does have lateral lean at times   Standing balance support: Bilateral upper extremity supported, During functional activity, Reliant on assistive device for balance Standing balance-Leahy Scale: Poor Standing balance comment: reliant on RW for support                            Communication Communication Communication: No apparent difficulties  Cognition Arousal: Alert Behavior During Therapy: WFL for tasks assessed/performed                             Following commands: Intact      Cueing Cueing Techniques: Verbal cues  Exercises      General Comments General comments (skin integrity, edema, etc.): The mobility that produced dizziness with nystagmus on yesterday's eval did not occur today.  Will not pursue vestibular testing unless patient complains of significant dizziness/vertigo over further sessions.      Pertinent Vitals/Pain Pain Assessment Pain Assessment: No/denies pain    Home Living Family/patient expects to be discharged to:: Assisted living Living Arrangements: Alone   Type of Home: Independent living facility Home Access: Level entry       Home Layout: One level Home Equipment: Cane - single point;Systems analyst (4 wheels) Additional Comments: [facility]    Prior Function            PT Goals (current goals can now be found in the care plan section) Acute Rehab PT Goals PT Goal Formulation: Patient unable to participate in goal setting Time For Goal Achievement: 12/04/23 Potential to Achieve Goals: Fair Progress towards PT goals: Progressing toward goals    Frequency    Min 2X/week      PT Plan      Co-evaluation              AM-PAC PT 6 Clicks Mobility   Outcome Measure  Help needed turning from your back to your side while in a flat bed without using bedrails?: A Little Help needed moving from lying on your back to sitting on the side of a flat bed without using bedrails?: A Little Help needed moving to and from a bed to a chair (including a wheelchair)?: A Little Help needed standing up from a chair using your arms (e.g., wheelchair or bedside chair)?: A Little Help needed to walk in hospital room?: Total Help needed climbing 3-5 steps with a railing? : Total 6 Click Score: 14    End of Session Equipment Utilized During Treatment: Gait belt Activity Tolerance: Patient tolerated treatment well Patient left: in bed;with call bell/phone within reach;with bed alarm set Nurse Communication: Mobility status PT Visit Diagnosis: History of falling (Z91.81);Muscle weakness (generalized) (M62.81);Difficulty in walking, not elsewhere classified (R26.2);Unsteadiness on feet (R26.81)     Time: 8884-8865 PT Time Calculation (min) (ACUTE ONLY): 19 min  Charges:    $Gait Training: 8-22 mins PT General Charges $$ ACUTE PT VISIT: 1 Visit                     11/21/2023  India HERO., PT Acute Rehabilitation Services [phone]  (office)   [name] 11/21/2023, 1:58 PM

## 2023-11-21 NOTE — Transfer of Care (Signed)
 Immediate Anesthesia Transfer of Care Note  Patient: [name]  Procedure(s) Performed: EGD (ESOPHAGOGASTRODUODENOSCOPY) BIOPSY, SKIN, SUBCUTANEOUS TISSUE, OR MUCOUS MEMBRANE  Patient Location: PACU  Anesthesia Type:MAC  Level of Consciousness: sedated  Airway & Oxygen Therapy: Patient Spontanous Breathing and Patient connected to face mask  Post-op Assessment: Report given to RN  Post vital signs: Reviewed and stable  Last Vitals:  Vitals Value Taken Time  BP 155/70 11/21/23 10:07  Temp 36.2 C 11/21/23 09:08  Pulse 63 11/21/23 10:07  Resp 12 11/21/23 10:07  SpO2 94 % 11/21/23 10:07  Vitals shown include unfiled device data.  Last Pain:  Vitals:   11/21/23 0930  TempSrc:   PainSc: 0-No pain         Complications: No notable events documented.

## 2023-11-21 NOTE — Progress Notes (Signed)
 PROGRESS NOTE        PATIENT DETAILS Name: [name] Age: 89 y.o. Sex: male Date of Birth: 14-Aug-1934 Admit Date: 11/19/2023 Admitting Physician Micaela Speaker, MD [name], Mike JONETTA Raddle., MD  Brief Summary: Patient is a 89 y.o.  male with history of PAF on Eliquis , HTN, HLD, DM-2, CAD s/p CABG, CKD stage IIIb who apparently has had worsening cognition/frequent falls over the past several days-outpatient neurology evaluation is currently pending for potential NPH-presented to the hospital with fall/syncope/scalp laceration-and upper GI bleeding.  Significant events: 8/4>> admit to [facility].  Significant studies: 8/3>> CT head: No cranial abnormality 8/3>> CT C-spine: No fracture/dislocation 8/3>> CT chest/abdomen/pelvis: No acute traumatic injury. 8/3>> CT angio GI bleed: No evidence of GI bleed.  Significant microbiology data: None  Procedures: 8/4>> EGD: Poor study-secondary to food in stomach 8/5>> EGD: Small Mallory-Weiss tear in the gastric cardia.  Consults: GI.  Subjective: Lying comfortably in bed-denies any chest pain or shortness of breath.  Objective: Vitals: Blood pressure (!) 155/70, pulse 64, temperature (!) 97.1 F (36.2 C), temperature source Temporal, resp. rate 12, height 5' 9 (1.753 m), weight 77.6 kg, SpO2 96%.   Exam: Gen Exam:Alert awake-not in any distress HEENT:atraumatic, normocephalic Chest: B/L clear to auscultation anteriorly CVS:S1S2 regular Abdomen:soft non tender, non distended Extremities:no edema Neurology: Non focal Skin: no rash  Pertinent Labs/Radiology:    Latest Ref Rng & Units 11/21/2023    5:56 AM 11/20/2023    2:46 PM 11/20/2023    5:28 AM  CBC  WBC 4.0 - 10.5 K/uL 9.9   19.2   Hemoglobin 13.0 - 17.0 g/dL 88.6  87.2  86.5   Hematocrit 39.0 - 52.0 % 33.5  38.4  40.9   Platelets 150 - 400 K/uL 241   298     Lab Results  Component Value Date   NA 135 11/21/2023   K 4.1 11/21/2023   CL 101  11/21/2023   CO2 27 11/21/2023      Assessment/Plan: Syncope vs fall Patient with no recollection of event Telemetry with A-fib Has ILR in place. Check echo Telemetry monitoring  Scalp laceration Secondary to above S/p staples in the ED-will need to be removed-7-10 days from 8/3. Given Tdap 8/4.  Upper GI bleed with acute blood loss anemia Secondary to Mallory-Weiss tear PPI Continue to hold Eliquis  for now Follow H/H  History of recurrent falls/worsening cognitive issues Per patient-has had 3-4 falls this year alone. Outpatient MD/PCP concerned about possible NPH-and has outpatient Neuro eval pending MRI brain/B12/TSH/RPR ordered Will touch base with neurology once above available-to see if patient would benefit from inpatient workup while off Eliquis .  History of CAD s/p CABG 2008 No anginal symptoms  History of CVA Does not appear to have any obvious focal deficits See above regarding plans for neuroimaging/workup. Eliquis  on hold Continue statin  Paroxysmal atrial fibrillation Rate controlled Eliquis  on hold Will need to touch base with patient/family/risk/benefits of continuing anticoagulation in the setting of frequent falls.  Chronic HFpEF Euvolemic.  HTN Stable Continue amlodipine /acebutolol   DM-2 (A1c 8.4 on 10/02/2022) Start SSI Continue Jardiance  Follow CBGs  Recent Labs    11/20/23 1011 11/21/23 0447 11/21/23 0919  GLUCAP 110* 81 71     CKD stage IIIb Close to baseline Follow electrolytes  Constipation Stool burden seen on CT Placed on bowel regimen-MiraLAX /senna  BPH Flomax   GAD/depression Wellbutrin /Lexapro   Code status:   Code Status: Full Code   DVT Prophylaxis: SCDs Start: 11/20/23 0143 Place TED hose Start: 11/20/23 0143   Family Communication: None at bedside   Disposition Plan: Status is: Inpatient Remains inpatient appropriate because: Severity of illness   Planned Discharge Destination:Skilled nursing  facility   Diet: Diet Order             Diet Heart Fluid consistency: Thin  Diet effective now                     Antimicrobial agents: Anti-infectives (From admission, onward)    None        MEDICATIONS: Scheduled Meds:  acebutolol   200 mg Oral BID   amLODipine   5 mg Oral Daily   atorvastatin   80 mg Oral Daily   buPROPion   150 mg Oral q morning   docusate sodium   100 mg Oral BID   empagliflozin   25 mg Oral Daily   escitalopram   10 mg Oral Daily   ezetimibe   10 mg Oral Daily   [START ON 11/22/2023] pantoprazole   40 mg Oral QAC breakfast   polyethylene glycol  17 g Oral BID   sodium chloride  flush  3 mL Intravenous Q12H   sodium chloride  flush  3 mL Intravenous Q12H   sodium zirconium cyclosilicate   10 g Oral Once   tamsulosin   0.4 mg Oral QPC supper   Continuous Infusions: PRN Meds:.acetaminophen  **OR** acetaminophen , ondansetron  **OR** ondansetron  (ZOFRAN ) IV, sodium chloride  flush   I have personally reviewed following labs and imaging studies  LABORATORY DATA: CBC: Recent Labs  Lab 11/19/23 2309 11/19/23 2321 11/20/23 0127 11/20/23 0528 11/20/23 1446 11/21/23 0556  WBC 11.5*  --   --  19.2*  --  9.9  HGB 14.0 14.3 13.2 13.4 12.7* 11.3*  HCT 42.7 42.0 40.1 40.9 38.4* 33.5*  MCV 99.1  --   --  99.8  --  97.7  PLT 269  --   --  298  --  241    Basic Metabolic Panel: Recent Labs  Lab 11/19/23 2309 11/19/23 2321 11/20/23 0528 11/21/23 0556  NA 137 137 137 135  K 4.5 4.5 5.6* 4.1  CL 100 99 101 101  CO2 26  --  27 27  GLUCOSE 151* 150* 134* 80  BUN 23 25* 23 21  CREATININE 1.29* 1.30* 1.39* 1.31*  CALCIUM  9.2  --  9.3 8.4*    GFR: Estimated Creatinine Clearance: 39.7 mL/min (A) (by C-G formula based on SCr of 1.31 mg/dL (H)).  Liver Function Tests: Recent Labs  Lab 11/19/23 2309 11/20/23 0528 11/21/23 0556  AST 39 33 23  ALT 29 27 19   ALKPHOS 77 69 58  BILITOT 0.8 1.1 1.0  PROT 6.4* 5.4* 5.1*  ALBUMIN 3.0* 3.0* 2.4*    Recent Labs  Lab 11/19/23 2309  LIPASE 28   Recent Labs  Lab 11/20/23 1446  AMMONIA 13    Coagulation Profile: Recent Labs  Lab 11/19/23 2309  INR 1.2    Cardiac Enzymes: Recent Labs  Lab 11/19/23 2309  CKTOTAL 30*    BNP (last 3 results) No results for input(s): PROBNP in the last 8760 hours.  Lipid Profile: No results for input(s): CHOL, HDL, LDLCALC, TRIG, CHOLHDL, LDLDIRECT in the last 72 hours.  Thyroid  Function Tests: No results for input(s): TSH, T4TOTAL, FREET4, T3FREE, THYROIDAB in the last 72 hours.  Anemia Panel: No results for input(s): VITAMINB12, FOLATE,  FERRITIN, TIBC, IRON, RETICCTPCT in the last 72 hours.  Urine analysis:    Component Value Date/Time   COLORURINE STRAW (A) 11/19/2023 2309   APPEARANCEUR HAZY (A) 11/19/2023 2309   LABSPEC 1.017 11/19/2023 2309   PHURINE 8.0 11/19/2023 2309   GLUCOSEU >=500 (A) 11/19/2023 2309   HGBUR NEGATIVE 11/19/2023 2309   BILIRUBINUR NEGATIVE 11/19/2023 2309   KETONESUR NEGATIVE 11/19/2023 2309   PROTEINUR 30 (A) 11/19/2023 2309   UROBILINOGEN 1.0 10/11/2009 0815   NITRITE NEGATIVE 11/19/2023 2309   LEUKOCYTESUR NEGATIVE 11/19/2023 2309    Sepsis Labs: Lactic Acid, Venous    Component Value Date/Time   LATICACIDVEN 1.3 11/20/2023 0655    MICROBIOLOGY: Recent Results (from the past 240 hours)  MRSA Next Gen by PCR, Nasal     Status: Abnormal   Collection Time: 11/21/23  5:05 AM   Specimen: Nasal Mucosa; Nasal Swab  Result Value Ref Range Status   MRSA by PCR Next Gen DETECTED (A) NOT DETECTED Final    Comment: RESULT CALLED TO, READ BACK BY AND VERIFIED WITH: RN Johana HERO [phone] fcp (NOTE) The GeneXpert MRSA Assay (FDA approved for NASAL specimens only), is one component of a comprehensive MRSA colonization surveillance program. It is not intended to diagnose MRSA infection nor to guide or monitor treatment for MRSA infections. Test performance is  not FDA approved in patients less than 31 years old. Performed at [facility], [address], [place], [place] [address]     RADIOLOGY STUDIES/RESULTS: CT ANGIO GI BLEED Result Date: 11/20/2023 EXAM: CTA ABDOMEN AND PELVIS WITH CONTRAST 11/20/2023 12:11:50 AM TECHNIQUE: CTA images of the abdomen and pelvis with intravenous contrast. Three-dimensional MIP/volume rendered formations were performed. Automated exposure control, iterative reconstruction, and/or weight based adjustment of the mA/kV was utilized to reduce the radiation dose to as low as reasonably achievable. COMPARISON: None available. CLINICAL HISTORY: Mesenteric ischemia, acute. Table formatting from the original note was not included. Images from the original note were not included. Patient BIB [facility] c/o fall with head injury. Patient found by neighbor on ground, covered in dark red emesis. EMS reports patient vomited dark red emesis en-route. Patient arrives with lac to back of head, covered in dark red blood. FINDINGS: (Note: Venous images are in the jacket for the CT chest abdomen and pelvis performed for blunt polytrauma.) GI BLEED: No active extravasation of contrast within the GI tract. VASCULAR: Advanced mixed density atherosclerotic plaque in the aorta without hemodynamically significant stenosis. No aneurysm or dissection. Moderate-to-severe narrowing at the origin of the SMA. Atherosclerotic plaque causes additional multifocal areas of mild narrowing in the celiac axis, bilateral renal arteries and iliac arteries. LOWER CHEST: Visualized portion of the lower chest demonstrates no acute abnormality. CT ABDOMEN/ PELVIS: HEPATOBILIARY: Cholelithiasis without evidence of acute cholecystitis. No acute traumatic injury. SPLEEN: Unremarkable. PANCREAS: Unremarkable. ADRENAL GLANDS: Unremarkable. KIDNEYS: No nephrolithiasis. No hydronephrosis. GI TRACT: Small hiatal hernia. No evidence of bowel ischemia.  No bowel obstruction.  Nonspecific stranding about the rectum and trace fluid in the presacral space. Moderate stool in the rectum. PERITONEUM: No ascites or free air. RETROPERITONEUM: No retroperitoneal hematoma. BLADDER: No acute abnormality. REPRODUCTIVE ORGANS: Enlarged prostate. No acute abnormality. BONES: Superior endplate compression fracture of L2 is similar to 11/05/2023 and new compared to 09/09/2023. Ankylosis of the SI joints. No acute traumatic fracture of the pelvis. IMPRESSION: 1. No evidence of acute GI bleeding or mesenteric ischemia. 2. Stranding in the presacral space and about the rectum is  nonspecific. Moderate stool in the rectum. Correlate for fecal impaction and stercoral colitis. 3. Small hiatal hernia. 4. Enlarged prostate. Electronically signed by: Norman Gatlin MD 11/20/2023 12:38 AM EDT RP Workstation: [id number]   CT CHEST ABDOMEN PELVIS W CONTRAST Result Date: 11/20/2023 EXAM: CT ANGIOGRAM CHEST ABDOMEN PELVIS 11/20/2023 12:11:22 AM TECHNIQUE: CTA of the chest, abdomen, pelvis was performed after the administration of iohexol  (OMNIPAQUE ) 350 MG/ML injection. Multiplanar reformatted images are provided for review. MIP images are provided for review. Automated exposure control, iterative reconstruction, and/or weight based adjustment of the mA/kV was utilized to reduce the radiation dose to as low as reasonably achievable. COMPARISON: Same day chest and pelvis radiographs as well as CT from 11/05/2023. CLINICAL HISTORY: Polytrauma, blunt. Patient BIB [facility] c/o fall with head injury. Patient found by neighbor on ground, covered in dark red emesis. EMS reports patient vomited dark red emesis en-route. Patient arrives with lac to back of head, covered in dark red blood. FINDINGS: CTA CHEST: THORACIC AORTA: No evidence of acute aortic injury. No dissection or aneurysm. MEDIASTINUM: No pericardial effusion. No mediastinal hematoma. No acute traumatic injury to the heart or pericardium. The central airways  are clear. Sternotomy and CABG. Coronary artery and aortic atherosclerotic calcifications. Small hiatal hernia. LUNGS: Atelectasis/scarring in the lung bases. No pleural effusion or pneumothorax. CHEST WALL: Subacute or chronic posterior right 10th and 11th rib fractures. No acute fracture in the chest. No chest wall hematoma. CTA ABDOMEN/ PELVIS: ABDOMINAL AORTA: No acute traumatic injury of the aorta. No dissection or aneurysm. ILIAC ARTERIES: No acute traumatic injury of the iliac vessels. No active extravasation of contrast. HEPATOBILIARY: Cholelithiasis without evidence of acute cholecystitis. No acute traumatic injury. SPLEEN: No acute traumatic injury. PANCREAS: No acute traumatic injury. ADRENAL GLANDS: No acute traumatic injury. KIDNEYS: No acute traumatic injury. No hydronephrosis. GI TRACT: No acute traumatic injury of the bowel. No bowel obstruction. Nonspecific stranding about the rectum and trace fluid in the presacral space. Moderate stool in the rectum. PERITONEUM: No ascites or free air. RETROPERITONEUM: No retroperitoneal hematoma. BLADDER: No acute abnormality. REPRODUCTIVE ORGANS: Enlarged prostate. No acute abnormality. BONES: Superior endplate compression fracture of L2 is similar to 11/05/2023 and new compared to 09/09/2023. Ankylosis of the SI joints. No acute traumatic fracture of the pelvis. IMPRESSION: 1. No acute traumatic injury in the chest, abdomen, or pelvis. 2. Superior endplate compression fracture of L2 is similar to 11/05/2023 and new compared to 09/09/2023. 3. Stranding in the presacral space and about the rectum is nonspecific. Moderate stool in the rectum. Correlate for fecal impaction and circ oral colitis. 4. Small hiatal hernia. 5. Enlarged prostate. Electronically signed by: Norman Gatlin MD 11/20/2023 12:30 AM EDT RP Workstation: [id number]   CT HEAD WO CONTRAST Result Date: 11/20/2023 EXAM: CT HEAD AND CERVICAL SPINE 11/20/2023 12:07:46 AM TECHNIQUE: CT of the head and  cervical spine was performed without the administration of intravenous contrast. Multiplanar reformatted images are provided for review. Automated exposure control, iterative reconstruction, and/or weight based adjustment of the mA/kV was utilized to reduce the radiation dose to as low as reasonably achievable. COMPARISON: None available. CLINICAL HISTORY: Head trauma, moderate-severe. Patient BIB [facility] c/o fall with head injury. Patient found by neighbor on ground, covered in dark red emesis. EMS reports patient vomited dark red emesis en-route. Patient arrives with lac to back of head, covered in dark red blood. FINDINGS: CT HEAD BRAIN AND VENTRICLES: Stable ventricular megaly. Chronic microvascular ischemia and generalized atrophy. ORBITS: No acute abnormality. SINUSES  AND MASTOIDS: Chronic paranasal sinusitis. No mastoid effusion. SOFT TISSUES AND SKULL: Posterior right paramidline scalp hematoma laceration. No acute skull fracture. CT CERVICAL SPINE BONES AND ALIGNMENT: No evidence of acute fracture or traumatic listhesis. DEGENERATIVE CHANGES: Multilevel spondylosis and facet arthropathy. Disc space height loss and degenerative endplate changes are greatest at C6-C7 where there are moderate changes. No severe spinal canal narrowing. SOFT TISSUES: No prevertebral soft tissue swelling. IMPRESSION: 1. No acute intracranial abnormality. 2. Posterior right paramidline scalp hematoma and laceration. 3. No acute fracture or traumatic malalignment of the cervical spine. Electronically signed by: Norman Gatlin MD 11/20/2023 12:13 AM EDT RP Workstation: [id number]   CT CERVICAL SPINE WO CONTRAST Result Date: 11/20/2023 EXAM: CT HEAD AND CERVICAL SPINE 11/20/2023 12:07:46 AM TECHNIQUE: CT of the head and cervical spine was performed without the administration of intravenous contrast. Multiplanar reformatted images are provided for review. Automated exposure control, iterative reconstruction, and/or weight based  adjustment of the mA/kV was utilized to reduce the radiation dose to as low as reasonably achievable. COMPARISON: None available. CLINICAL HISTORY: Head trauma, moderate-severe. Patient BIB [facility] c/o fall with head injury. Patient found by neighbor on ground, covered in dark red emesis. EMS reports patient vomited dark red emesis en-route. Patient arrives with lac to back of head, covered in dark red blood. FINDINGS: CT HEAD BRAIN AND VENTRICLES: Stable ventricular megaly. Chronic microvascular ischemia and generalized atrophy. ORBITS: No acute abnormality. SINUSES AND MASTOIDS: Chronic paranasal sinusitis. No mastoid effusion. SOFT TISSUES AND SKULL: Posterior right paramidline scalp hematoma laceration. No acute skull fracture. CT CERVICAL SPINE BONES AND ALIGNMENT: No evidence of acute fracture or traumatic listhesis. DEGENERATIVE CHANGES: Multilevel spondylosis and facet arthropathy. Disc space height loss and degenerative endplate changes are greatest at C6-C7 where there are moderate changes. No severe spinal canal narrowing. SOFT TISSUES: No prevertebral soft tissue swelling. IMPRESSION: 1. No acute intracranial abnormality. 2. Posterior right paramidline scalp hematoma and laceration. 3. No acute fracture or traumatic malalignment of the cervical spine. Electronically signed by: Norman Gatlin MD 11/20/2023 12:13 AM EDT RP Workstation: [id number]   DG Pelvis Portable Result Date: 11/19/2023 CLINICAL DATA:  Status post fall. EXAM: PORTABLE PELVIS 1-2 VIEWS COMPARISON:  October 28, 2009 FINDINGS: There is no evidence of pelvic fracture or diastasis. No pelvic bone lesions are seen. IMPRESSION: Negative. Electronically Signed   By: Suzen Dials M.D.   On: 11/19/2023 23:33   DG Chest Port 1 View Result Date: 11/19/2023 CLINICAL DATA:  Status post fall. EXAM: PORTABLE CHEST 1 VIEW COMPARISON:  November 05, 2023 FINDINGS: Multiple sternal wires and vascular clips are noted. The heart size and mediastinal  contours are within normal limits. A radiopaque loop recorder device is seen. Low lung volumes are noted with mild elevation of the right hemidiaphragm. Both lungs are clear. The visualized skeletal structures are unremarkable. IMPRESSION: 1. Evidence of prior median sternotomy/CABG. 2. No active cardiopulmonary disease. Electronically Signed   By: Suzen Dials M.D.   On: 11/19/2023 23:32     LOS: 1 day   Donalda Applebaum, MD  [facility]    To contact the attending provider between 7A-7P or the covering provider during after hours 7P-7A, please log into the web site [email or web address] and access using universal [place] password for that web site. If you do not have the password, please call the hospital operator.  11/21/2023, 11:26 AM

## 2023-11-21 NOTE — TOC Initial Note (Signed)
 Transition of Care [facility]) - Initial/Assessment Note    Patient Details  Name: [name] MRN: [id number] Date of Birth: 07-31-1934  [facility]) CM/SW Contact:    Inocente GORMAN Kindle, LCSW Phone Number: 11/21/2023, 12:20 PM  Clinical Narrative:                 CSW spoke with patient's daughter regarding SNF recommendation. She stated patient would not be happy in a SNF and requests for DC orders to reflect need for therapies 5x/week sent to [facility] where he is currently receiving therapies at [facility] [place]. She confirmed he has a walker at home. ICM will continue to follow and fax orders to [facility] (contact there is [name] [phone], f. [phone]).    Expected Discharge Plan: Home w Home Health Services Barriers to Discharge: Continued Medical Work up   Patient Goals and CMS Choice   CMS Medicare.gov Compare Post Acute Care list provided to:: Patient Represenative (must comment) Choice offered to / list presented to : Adult Children [place] ownership interest in [facility].provided to:: Adult Children    Expected Discharge Plan and Services In-house Referral: Clinical Social Work Discharge Planning Services: CM Consult Post Acute Care Choice: Resumption of Svcs/PTA Provider Living arrangements for the past 2 months: Independent Living Facility                                      Prior Living Arrangements/Services Living arrangements for the past 2 months: Independent Living Facility Lives with:: Self Patient language and need for interpreter reviewed:: Yes Do you feel safe going back to the place where you live?: Yes      Need for Family Participation in Patient Care: Yes (Comment) Care giver support system in place?: Yes (comment) Current home services: DME, Home PT, Home OT (walker) Criminal Activity/Legal Involvement Pertinent to Current Situation/Hospitalization: No - Comment as needed  Activities of Daily Living       Permission Sought/Granted Permission sought to share information with : Facility Medical sales representative, Family Supports Permission granted to share information with : Yes, Verbal Permission Granted  Share Information with NAME: Jerrye Raring- Daughter   [phone]  Permission granted to share info w AGENCY: [facility]        Emotional Assessment Appearance:: Appears stated age   Affect (typically observed): Appropriate Orientation: : Oriented to Self, Oriented to Place, Oriented to  Time, Oriented to Situation Alcohol  / Substance Use: Not Applicable Psych Involvement: No (comment)  Admission diagnosis:  Hematemesis [K92.0] Injury of head, initial encounter [S09.90XA] Fall, initial encounter [W19.XXXA] Laceration of scalp, initial encounter [S01.01XA] Hematemesis, unspecified whether nausea present [K92.0] Patient Active Problem List   Diagnosis Date Noted   Mallory-Weiss tear 11/21/2023   Gastritis without bleeding 11/21/2023   Hematemesis 11/20/2023   History of unsteady gait 11/20/2023   Fall at home, initial encounter 11/20/2023   History of CAD (coronary artery disease) 11/20/2023   CKD stage 3b, GFR 30-44 ml/min (HCC) 11/20/2023   Lactic acidosis 11/20/2023   GAD (generalized anxiety disorder) 11/20/2023   Scalp laceration, initial encounter 11/20/2023   SIRS (systemic inflammatory response syndrome) (HCC) 11/05/2023   Generalized weakness 11/05/2023   Paroxysmal atrial fibrillation (HCC) 11/05/2023   Non-insulin  dependent type 2 diabetes mellitus (HCC) 11/05/2023   Essential hypertension 11/05/2023   History of CVA (cerebrovascular accident) 11/05/2023   Mild cognitive impairment 11/05/2023   Ischemic cerebrovascular  accident (CVA) (HCC) 10/06/2022   Status post surgery 10/02/2022   Stroke: Multiple scattered bilateral ischemic infarcts s/p thrombectomy of left P1/PCA TICI 3 Etiology: Likely cardioembolic 10/02/2022   Hemianopsia 10/01/2022   Right homonymous  hemianopsia 10/01/2022   Loop recorder: Biotronic Biomonitor II loop 07/28/2021 07/28/2021   Unsteady gait 08/05/2020   BPH (benign prostatic hyperplasia) 01/21/2020   Depression with anxiety 02/02/2018   Right hip pain 10/02/2017   Recurrent major depression in remission (HCC) 01/27/2017   Chronic kidney disease, stage 3a (HCC) 07/17/2015   History of colonic polyps 03/26/2013   Obesity 10/04/2012   OBSTRUCTIVE SLEEP APNEA 10/30/2008   Hyperlipidemia 07/01/2008   HYPERTENSION, BENIGN 07/01/2008   CAD, NATIVE VESSEL 07/01/2008   FATIGUE / MALAISE 07/01/2008   EDEMA 07/01/2008   PCP:  Loreli Elsie JONETTA Mickey., MD Pharmacy:   [facility] [address] [place] - [address] [place] [address] Phone: [phone] Fax: [phone]  [facility] [address] [place] [address] Phone: [phone] Fax: [phone]  [facility] [address] [place] [address] Phone: [phone] Fax: [phone]  [facility] [address] [place] [address] Phone: [phone] Fax: [phone]     Social Drivers of Health (SDOH) Social History: SDOH Screenings   Food Insecurity: No Food Insecurity (11/20/2023)  Housing: Low Risk  (11/20/2023)  Transportation Needs: No Transportation Needs (11/20/2023)  Utilities: Not At Risk (11/20/2023)  Depression (PHQ2-9): Medium Risk (03/20/2023)  Social Connections: Moderately Isolated (11/20/2023)  Tobacco Use: Low Risk  (11/21/2023)   SDOH Interventions:     Readmission Risk Interventions     No data to display

## 2023-11-22 ENCOUNTER — Inpatient Hospital Stay (HOSPITAL_COMMUNITY)

## 2023-11-22 ENCOUNTER — Other Ambulatory Visit (HOSPITAL_COMMUNITY): Payer: Self-pay

## 2023-11-22 DIAGNOSIS — S0101XA Laceration without foreign body of scalp, initial encounter: Secondary | ICD-10-CM | POA: Diagnosis not present

## 2023-11-22 DIAGNOSIS — W19XXXA Unspecified fall, initial encounter: Secondary | ICD-10-CM | POA: Diagnosis not present

## 2023-11-22 DIAGNOSIS — R4182 Altered mental status, unspecified: Secondary | ICD-10-CM

## 2023-11-22 DIAGNOSIS — K920 Hematemesis: Secondary | ICD-10-CM | POA: Diagnosis not present

## 2023-11-22 DIAGNOSIS — I480 Paroxysmal atrial fibrillation: Secondary | ICD-10-CM | POA: Diagnosis not present

## 2023-11-22 DIAGNOSIS — R55 Syncope and collapse: Secondary | ICD-10-CM | POA: Diagnosis not present

## 2023-11-22 LAB — CBC
HCT: 38.8 % — ABNORMAL LOW (ref 39.0–52.0)
Hemoglobin: 13.1 g/dL (ref 13.0–17.0)
MCH: 33 pg (ref 26.0–34.0)
MCHC: 33.8 g/dL (ref 30.0–36.0)
MCV: 97.7 fL (ref 80.0–100.0)
Platelets: 244 K/uL (ref 150–400)
RBC: 3.97 MIL/uL — ABNORMAL LOW (ref 4.22–5.81)
RDW: 14 % (ref 11.5–15.5)
WBC: 8.1 K/uL (ref 4.0–10.5)
nRBC: 0 % (ref 0.0–0.2)

## 2023-11-22 LAB — BASIC METABOLIC PANEL WITH GFR
Anion gap: 10 (ref 5–15)
BUN: 17 mg/dL (ref 8–23)
CO2: 26 mmol/L (ref 22–32)
Calcium: 8.6 mg/dL — ABNORMAL LOW (ref 8.9–10.3)
Chloride: 100 mmol/L (ref 98–111)
Creatinine, Ser: 1.17 mg/dL (ref 0.61–1.24)
GFR, Estimated: >60 mL/min (ref >60)
Glucose, Bld: 128 mg/dL — ABNORMAL HIGH (ref 70–99)
Potassium: 3.7 mmol/L (ref 3.5–5.1)
Sodium: 136 mmol/L (ref 135–145)

## 2023-11-22 LAB — ECHOCARDIOGRAM COMPLETE
AR max vel: 1.85 cm2
AV Area VTI: 1.88 cm2
AV Area mean vel: 1.77 cm2
AV Mean grad: 3 mmHg
AV Peak grad: 6.1 mmHg
Ao pk vel: 1.23 m/s
Area-P 1/2: 2.62 cm2
Calc EF: 64.1 %
Height: 69 in
S' Lateral: 2.5 cm
Single Plane A2C EF: 55.7 %
Single Plane A4C EF: 70.5 %
Weight: 2736 [oz_av]

## 2023-11-22 LAB — GLUCOSE, CAPILLARY
Glucose-Capillary: 71 mg/dL (ref 70–99)
Glucose-Capillary: 218 mg/dL — ABNORMAL HIGH (ref 70–99)
Glucose-Capillary: 114 mg/dL — ABNORMAL HIGH (ref 70–99)
Glucose-Capillary: 162 mg/dL — ABNORMAL HIGH (ref 70–99)

## 2023-11-22 LAB — AMMONIA: Ammonia: 26 umol/L (ref 9–35)

## 2023-11-22 LAB — RPR: RPR Ser Ql: NONREACTIVE

## 2023-11-22 LAB — HEMOGLOBIN A1C
Hgb A1c MFr Bld: 7.1 % — ABNORMAL HIGH (ref 4.8–5.6)
Mean Plasma Glucose: 157 mg/dL

## 2023-11-22 LAB — SURGICAL PATHOLOGY

## 2023-11-22 MED ORDER — VITAMIN B-12 1000 MCG PO TABS
1000.0000 ug | ORAL_TABLET | Freq: Every day | ORAL | 2 refills | Status: AC
  Filled 2023-11-22: qty 30, 30d supply, fill #0

## 2023-11-22 MED ORDER — PANTOPRAZOLE SODIUM 40 MG PO TBEC
40.0000 mg | DELAYED_RELEASE_TABLET | Freq: Every day | ORAL | 2 refills | Status: AC
  Filled 2023-11-22: qty 30, 30d supply, fill #0

## 2023-11-22 MED ORDER — PANTOPRAZOLE SODIUM 40 MG PO TBEC: 40.0000 mg | DELAYED_RELEASE_TABLET | Freq: Two times a day (BID) | ORAL | Status: DC

## 2023-11-22 MED ORDER — MUPIROCIN 2 % EX OINT
1.0000 | TOPICAL_OINTMENT | Freq: Two times a day (BID) | CUTANEOUS | Status: DC
  Administered 2023-11-22: 1 via NASAL
  Filled 2023-11-22: qty 22

## 2023-11-22 MED ORDER — POLYETHYLENE GLYCOL 3350 17 GM/SCOOP PO POWD
17.0000 g | Freq: Every day | ORAL | 0 refills | Status: AC
Start: 2023-11-22 — End: ?
  Filled 2023-11-22: qty 238, 14d supply, fill #0

## 2023-11-22 MED ORDER — SENNA 8.6 MG PO TABS
1.0000 | ORAL_TABLET | Freq: Every day | ORAL | 0 refills | Status: AC
  Filled 2023-11-22: qty 120, 120d supply, fill #0

## 2023-11-22 MED ORDER — CYANOCOBALAMIN 1000 MCG/ML IJ SOLN
1000.0000 ug | Freq: Every day | INTRAMUSCULAR | Status: DC
  Administered 2023-11-22: 1000 ug via SUBCUTANEOUS
  Filled 2023-11-22: qty 1

## 2023-11-22 MED ORDER — PERFLUTREN LIPID MICROSPHERE
1.0000 mL | INTRAVENOUS | Status: AC | PRN
  Administered 2023-11-22: 5 mL via INTRAVENOUS

## 2023-11-22 MED ORDER — CLOPIDOGREL BISULFATE 75 MG PO TABS
75.0000 mg | ORAL_TABLET | Freq: Every day | ORAL | 2 refills | Status: AC
Start: 2023-11-22 — End: 2024-11-21
  Filled 2023-11-22: qty 30, 30d supply, fill #0

## 2023-11-22 MED ORDER — CHLORHEXIDINE GLUCONATE CLOTH 2 % EX PADS: 6.0000 | MEDICATED_PAD | Freq: Every day | CUTANEOUS | Status: DC

## 2023-11-22 NOTE — Progress Notes (Signed)
 [facility] Summary Report / Loop Recorder

## 2023-11-22 NOTE — Progress Notes (Signed)
 PT Cancellation Note  Patient Details Name: [name] MRN: [id number] DOB: [birth year]   Cancelled Treatment:    Reason Eval/Treat Not Completed: Patient at procedure or test/unavailable (Pt with echo. Will follow up later if time allows.)   Ulis Kaps 11/22/2023, 12:27 PM

## 2023-11-22 NOTE — Procedures (Signed)
 Patient Name: [name]  MRN: [id number]  Epilepsy Attending: Pastor Falling  Referring Physician/Provider: No ref. provider found      Date: 11/22/2023 Duration: 23 minutes   Patient history: 89 y.o.  male with history of PAF on Eliquis , HTN, HLD, DM-2, CAD s/p CABG, CKD stage IIIb who apparently has had worsening cognition/frequent falls over the past several days-outpatient neurology evaluation is currently pending for potential NPH-presented to the hospital with fall/syncope/scalp laceration-and upper GI bleeding.   Level of alertness: Awake, drowsy  AEDs during EEG study: None   Technical aspects: This EEG study was done with scalp electrodes positioned according to the 10-20 International system of electrode placement. Electrical activity was reviewed with band pass filter of 1-70Hz , sensitivity of 7 uV/mm, display speed of 40mm/sec with a 60Hz  notched filter applied as appropriate. EEG data were recorded continuously and digitally stored.  Video monitoring was available and reviewed as appropriate.  Description: The posterior dominant rhythm consists of 8.5 Hz activity of moderate voltage (25-35 uV) seen predominantly in posterior head regions, symmetric and reactive to eye opening and eye closing. Drowsiness was characterized by attenuation of the posterior background rhythm. Sleep was not seen. Hyperventilation and photic stimulation were not performed.     ABNORMALITY -None    IMPRESSION: This study is within normal limits.  No seizures or epileptiform discharges were seen throughout the recording. A normal interictal EEG does not exclude nor support the diagnosis of epilepsy.   Pastor Falling MD Neurology

## 2023-11-22 NOTE — Progress Notes (Signed)
Routine EEG complete. Results pending.

## 2023-11-22 NOTE — Anesthesia Postprocedure Evaluation (Signed)
 Anesthesia Post Note  Patient: [name]  Procedure(s) Performed: EGD (ESOPHAGOGASTRODUODENOSCOPY) BIOPSY, SKIN, SUBCUTANEOUS TISSUE, OR MUCOUS MEMBRANE     Patient location during evaluation: PACU Anesthesia Type: MAC Level of consciousness: awake and alert Pain management: pain level controlled Vital Signs Assessment: post-procedure vital signs reviewed and stable Respiratory status: spontaneous breathing, nonlabored ventilation, respiratory function stable and patient connected to nasal cannula oxygen Cardiovascular status: stable and blood pressure returned to baseline Postop Assessment: no apparent nausea or vomiting Anesthetic complications: no   No notable events documented.  Last Vitals:  Vitals:   11/22/23 0013 11/22/23 0527  BP: (!) 124/103   Pulse: 68 73  Resp:  17  Temp: 36.8 C   SpO2: 93% 95%    Last Pain:  Vitals:   11/22/23 0013  TempSrc: Oral  PainSc:                  [name]

## 2023-11-22 NOTE — TOC Transition Note (Signed)
 Transition of Care [facility]) - Discharge Note   Patient Details  Name: [name] MRN: [id number] Date of Birth: 1934-06-16  Transition of Care [facility]) CM/SW Contact:  Marval Gell, RN Phone Number: 11/22/2023, 1:59 PM   Clinical Narrative:     Notified by attending that he spoken with daughters who are aware that patient will DC today afor them to set up their private caregiver support. Family to transport home.  Faxed HH orders to [facility] for [employer] to resume services.  Final next level of care: Home w Home Health Services Barriers to Discharge: Continued Medical Work up   Patient Goals and CMS Choice   CMS Medicare.gov Compare Post Acute Care list provided to:: Patient Represenative (must comment) Choice offered to / list presented to : Adult Children [place] ownership interest in [facility].provided to:: Adult Children    Discharge Placement                       Discharge Plan and Services Additional resources added to the After Visit Summary for   In-house Referral: Clinical Social Work Discharge Planning Services: CM Consult Post Acute Care Choice: Resumption of Svcs/PTA Provider                               Social Drivers of Health (SDOH) Interventions SDOH Screenings   Food Insecurity: No Food Insecurity (11/20/2023)  Housing: Low Risk  (11/20/2023)  Transportation Needs: No Transportation Needs (11/20/2023)  Utilities: Not At Risk (11/20/2023)  Depression (PHQ2-9): Medium Risk (03/20/2023)  Social Connections: Moderately Isolated (11/20/2023)  Tobacco Use: Low Risk  (11/21/2023)     Readmission Risk Interventions     No data to display

## 2023-11-22 NOTE — Discharge Summary (Addendum)
 PATIENT DETAILS Name: [name] Age: 89 y.o. Sex: male Date of Birth: 09/08/1934 MRN: [id number]. Admitting Physician: Subrina Sundil, MD [name], [name], MD  Admit Date: 11/19/2023 Discharge date: 11/22/2023  Recommendations for Outpatient Follow-up:  Follow up with PCP in 1-2 weeks Please obtain CMP/CBC in one week Ensure outpatient follow-up with neurology/cardiology Eliquis  held/discontinued EGD biopsy results-pending-please follow Staples in scalp-remove 7 days from day of discharge.  Admitted From:  Home  Disposition: Home health   Discharge Condition: good  CODE STATUS:   Code Status: Full Code   Diet recommendation:  Diet Order             Diet - low sodium heart healthy           Diet Carb Modified           Diet Heart Fluid consistency: Thin  Diet effective now                    Brief Summary: Patient is a 89 y.o.  male with history of PAF on Eliquis , HTN, HLD, DM-2, CAD s/p CABG, CKD stage IIIb who apparently has had worsening cognition/frequent falls over the past several days-outpatient neurology evaluation is currently pending for potential NPH-presented to the hospital with fall/syncope/scalp laceration-and upper GI bleeding.   Significant events: 8/4>> admit to [facility].   Significant studies: 8/3>> CT head: No cranial abnormality 8/3>> CT C-spine: No fracture/dislocation 8/3>> CT chest/abdomen/pelvis: No acute traumatic injury. 8/3>> CT angio GI bleed: No evidence of GI bleed. 8/6>> echo: EF 50-55%   Significant microbiology data: None   Procedures: 8/4>> EGD: Poor study-secondary to food in stomach 8/5>> EGD: Small Mallory-Weiss tear in the gastric cardia.   Consults: GI.  Brief Hospital Course: Syncope vs fall Patient with no recollection of event Telemetry with A-fib Echo with stable EF EEG negative Family not interested in SNF-being discharged back to independent living with home health services.  Scalp  laceration Secondary to above S/p staples in the ED-will need to be removed-7 days from 8/6. Given Tdap 8/4.   Upper GI bleed with acute blood loss anemia Secondary to Mallory-Weiss tear PPI daily Eliquis  held-see below Hb stable-follow periodically as outpatient.   History of recurrent falls/worsening cognitive issues Per patient-has had 3-4 falls this year alone.  Spoke with family-it appears that he has had more falls recently Outpatient MD/PCP concerned about possible NPH-and has outpatient Neuro eval pending MRI brain with mild ventriculomegaly, TSH/RPR stable-however vitamin B12 on the lower side-supplementation has been started Touched base with neurology-Dr Arora-recommended workup for NPH-be done in the outpatient setting-he already has follow-up with neurology. Being discharged home with home health services-family not interested in SNF.  Per family-patient has extensive caregiver support throughout the daytime.   History of CAD s/p CABG 2008 No anginal symptoms   History of CVA Does not appear to have any obvious focal deficits No longer on Eliquis -see below. Continue statin   Paroxysmal atrial fibrillation Rate controlled Risks/benefits of continuing anticoagulation discussed with patient-daughters Marciana on 8/5, Sonny on 8/6)-and then with patient's cardiologist-Dr. Ladona on 8/6-current consensus is that given recurrent falls over the past several months (moved out of his house to independent living)-and his cognitive issues-it is felt that risk of anticoagulation outweighs any benefit at this point.  Family/patient okay with stopping anticoagulation-understand the risk of stroke.  Dr. Ganji recommending that we switch to Plavix  instead. Follow-up with outpatient cardiology.   Chronic HFpEF Euvolemic.  HTN Stable Resume usual antihypertensives on discharge.  DM-2 (A1c 8.4 on 10/02/2022) CBG stable-resume usual outpatient regimen.  CKD stage IIIb Close to  baseline Follow electrolytes   Constipation Stool burden seen on CT Continue MiraLAX /senna   BPH Flomax    GAD/depression Wellbutrin /Lexapro   Discharge Diagnoses:  Principal Problem:   Hematemesis Active Problems:   Fall at home, initial encounter   Lactic acidosis   Paroxysmal atrial fibrillation (HCC)   Non-insulin  dependent type 2 diabetes mellitus (HCC)   Hyperlipidemia   Depression with anxiety   BPH (benign prostatic hyperplasia)   Essential hypertension   History of CVA (cerebrovascular accident)   Mild cognitive impairment   History of unsteady gait   History of CAD (coronary artery disease)   CKD stage 3b, GFR 30-44 ml/min (HCC)   GAD (generalized anxiety disorder)   Scalp laceration, initial encounter   Mallory-Weiss tear   Gastritis without bleeding   Discharge Instructions:  Activity:  As tolerated with Full fall precautions use walker/cane & assistance as needed   Discharge Instructions     Diet - low sodium heart healthy   Complete by: As directed    Diet Carb Modified   Complete by: As directed    Discharge instructions   Complete by: As directed    Follow with Primary MD  Loreli Elsie JONETTA Mickey., MD in 1-2 weeks  EGD biopsy results are pending-please follow  You have staples in your scalp-these will need to be removed 1 week from the day of discharge.  Please talk to your primary care practitioner-to have this removed.  Please get a complete blood count and chemistry panel checked by your Primary MD at your next visit, and again as instructed by your Primary MD.  Get Medicines reviewed and adjusted: Please take all your medications with you for your next visit with your Primary MD  Laboratory/radiological data: Please request your Primary MD to go over all hospital tests and procedure/radiological results at the follow up, please ask your Primary MD to get all Hospital records sent to his/her office.  In some cases, they will be blood work,  cultures and biopsy results pending at the time of your discharge. Please request that your primary care M.D. follows up on these results.  Also Note the following: If you experience worsening of your admission symptoms, develop shortness of breath, life threatening emergency, suicidal or homicidal thoughts you must seek medical attention immediately by calling 911 or calling your MD immediately  if symptoms less severe.  You must read complete instructions/literature along with all the possible adverse reactions/side effects for all the Medicines you take and that have been prescribed to you. Take any new Medicines after you have completely understood and accpet all the possible adverse reactions/side effects.   Do not drive when taking Pain medications or sleeping medications (Benzodaizepines)  Do not take more than prescribed Pain, Sleep and Anxiety Medications. It is not advisable to combine anxiety,sleep and pain medications without talking with your primary care practitioner  Special Instructions: If you have smoked or chewed Tobacco  in the last 2 yrs please stop smoking, stop any regular Alcohol   and or any Recreational drug use.  Wear Seat belts while driving.  Please note: You were cared for by a hospitalist during your hospital stay. Once you are discharged, your primary care physician will handle any further medical issues. Please note that NO REFILLS for any discharge medications will be authorized once you are discharged, as it is  imperative that you return to your primary care physician (or establish a relationship with a primary care physician if you do not have one) for your post hospital discharge needs so that they can reassess your need for medications and monitor your lab values.   Increase activity slowly   Complete by: As directed    No wound care   Complete by: As directed       Allergies as of 11/22/2023       Reactions   Ketorolac Tromethamine Other (See Comments)    nephrotic syndrome        Medication List     STOP taking these medications    apixaban  5 MG Tabs tablet Commonly known as: Eliquis    cefadroxil  500 MG capsule Commonly known as: DURICEF       TAKE these medications    acebutolol  200 MG capsule Commonly known as: SECTRAL  Take 1 capsule (200 mg total) by mouth 2 (two) times daily.   atorvastatin  40 MG tablet Commonly known as: LIPITOR  Take 40 mg by mouth every evening. What changed: Another medication with the same name was removed. Continue taking this medication, and follow the directions you see here.   buPROPion  150 MG 24 hr tablet Commonly known as: WELLBUTRIN  XL Take 150 mg by mouth every morning.   clopidogrel  75 MG tablet Commonly known as: Plavix  Take 1 tablet (75 mg total) by mouth daily.   cyanocobalamin  1000 MCG tablet Commonly known as: VITAMIN B12 Take 1 tablet (1,000 mcg total) by mouth daily.   empagliflozin  25 MG Tabs tablet Commonly known as: JARDIANCE  Take 1 tablet (25 mg total) by mouth daily. What changed: when to take this   escitalopram  10 MG tablet Commonly known as: LEXAPRO  Take 10 mg by mouth daily.   ezetimibe  10 MG tablet Commonly known as: ZETIA  Take 10 mg by mouth daily.   furosemide  40 MG tablet Commonly known as: LASIX  Take 1 tablet (40 mg total) by mouth daily as needed for edema.   metFORMIN  1000 MG tablet Commonly known as: GLUCOPHAGE  Take 1,000 mg by mouth 2 (two) times daily with a meal.   mirabegron  ER 50 MG Tb24 tablet Commonly known as: MYRBETRIQ  Take 1 tablet (50 mg total) by mouth daily.   olmesartan  20 MG tablet Commonly known as: BENICAR  Take 1 tablet (20 mg total) by mouth daily.   pantoprazole  40 MG tablet Commonly known as: PROTONIX  Take 1 tablet (40 mg total) by mouth daily.   polyethylene glycol 17 g packet Commonly known as: MIRALAX  / GLYCOLAX  Take 17 g by mouth daily.   senna 8.6 MG Tabs tablet Commonly known as: SENOKOT Take 1 tablet  (8.6 mg total) by mouth at bedtime.   tadalafil  5 MG tablet Commonly known as: CIALIS  Take 1 tablet (5 mg total) by mouth daily. What changed: when to take this   tamsulosin  0.4 MG Caps capsule Commonly known as: FLOMAX  Take 1 capsule (0.4 mg total) by mouth daily after supper. What changed: when to take this   Tresiba FlexTouch 200 UNIT/ML FlexTouch Pen Generic drug: insulin  degludec Inject 20 Units into the skin as needed (if BS is above 200).        Follow-up Information     Loreli Elsie JONETTA Mickey., MD. Schedule an appointment as soon as possible for a visit in 1 week(s).   Specialty: Internal Medicine Contact information: [address] [place] [address] [phone]         Ladona Heinz,  MD. Schedule an appointment as soon as possible for a visit in 2 week(s).   Specialty: Cardiology Contact information: [address] [place] [address] [phone]                Allergies  Allergen Reactions   Ketorolac Tromethamine Other (See Comments)    nephrotic syndrome     Other Procedures/Studies: EEG adult Result Date: 11/22/2023 Gregg Lek, MD     11/22/2023 10:48 AM Patient Name: EOGHAN BELCHER MRN: [id number] Epilepsy Attending: Lek Gregg Referring Physician/Provider: No ref. provider found     Date: 11/22/2023 Duration: 23 minutes Patient history: 89 y.o.  male with history of PAF on Eliquis , HTN, HLD, DM-2, CAD s/p CABG, CKD stage IIIb who apparently has had worsening cognition/frequent falls over the past several days-outpatient neurology evaluation is currently pending for potential NPH-presented to the hospital with fall/syncope/scalp laceration-and upper GI bleeding. Level of alertness: Awake, drowsy AEDs during EEG study: None Technical aspects: This EEG study was done with scalp electrodes positioned according to the 10-20 International system of electrode placement. Electrical activity was reviewed with band pass filter of 1-70Hz ,  sensitivity of 7 uV/mm, display speed of 98mm/sec with a 60Hz  notched filter applied as appropriate. EEG data were recorded continuously and digitally stored.  Video monitoring was available and reviewed as appropriate. Description: The posterior dominant rhythm consists of 8.5 Hz activity of moderate voltage (25-35 uV) seen predominantly in posterior head regions, symmetric and reactive to eye opening and eye closing. Drowsiness was characterized by attenuation of the posterior background rhythm. Sleep was not seen. Hyperventilation and photic stimulation were not performed.   ABNORMALITY -None IMPRESSION: This study is within normal limits.  No seizures or epileptiform discharges were seen throughout the recording. A normal interictal EEG does not exclude nor support the diagnosis of epilepsy. Lek Gregg MD Neurology    MR BRAIN WO CONTRAST Result Date: 11/22/2023 CLINICAL DATA:  Mental status change, unknown cause EXAM: MRI HEAD WITHOUT CONTRAST TECHNIQUE: Multiplanar, multiecho pulse sequences of the brain and surrounding structures were obtained without intravenous contrast. COMPARISON:  CT head 11/20/2023.  MRI head October 02, 2022. FINDINGS: Brain: No acute infarction, hemorrhage, extra-axial collection or mass lesion. Unchanged chronic microhemorrhage in the thalami and left cerebellum. Remote left cerebellar infarct. Similar chronic ventriculomegaly. Vascular: Major arterial flow voids are maintained at the skull base. Skull and upper cervical spine: Normal marrow signal. Sinuses/Orbits: Mild paranasal sinus mucosal thickening. No acute orbital findings. IMPRESSION: 1. No evidence of acute intracranial abnormality. 2. Unchanged chronic microvascular ischemic disease, chronic thalamic microhemorrhages, and nonspecific ventriculomegaly. 3. Remote left cerebellar infarct, new since 2024. Electronically Signed   By: Gilmore GORMAN Molt M.D.   On: 11/22/2023 00:00   CT ANGIO GI BLEED Result Date:  11/20/2023 EXAM: CTA ABDOMEN AND PELVIS WITH CONTRAST 11/20/2023 12:11:50 AM TECHNIQUE: CTA images of the abdomen and pelvis with intravenous contrast. Three-dimensional MIP/volume rendered formations were performed. Automated exposure control, iterative reconstruction, and/or weight based adjustment of the mA/kV was utilized to reduce the radiation dose to as low as reasonably achievable. COMPARISON: None available. CLINICAL HISTORY: Mesenteric ischemia, acute. Table formatting from the original note was not included. Images from the original note were not included. Patient BIB [facility] c/o fall with head injury. Patient found by neighbor on ground, covered in dark red emesis. EMS reports patient vomited dark red emesis en-route. Patient arrives with lac to back of head, covered in dark red blood. FINDINGS: (Note: Venous images are in  the jacket for the CT chest abdomen and pelvis performed for blunt polytrauma.) GI BLEED: No active extravasation of contrast within the GI tract. VASCULAR: Advanced mixed density atherosclerotic plaque in the aorta without hemodynamically significant stenosis. No aneurysm or dissection. Moderate-to-severe narrowing at the origin of the SMA. Atherosclerotic plaque causes additional multifocal areas of mild narrowing in the celiac axis, bilateral renal arteries and iliac arteries. LOWER CHEST: Visualized portion of the lower chest demonstrates no acute abnormality. CT ABDOMEN/ PELVIS: HEPATOBILIARY: Cholelithiasis without evidence of acute cholecystitis. No acute traumatic injury. SPLEEN: Unremarkable. PANCREAS: Unremarkable. ADRENAL GLANDS: Unremarkable. KIDNEYS: No nephrolithiasis. No hydronephrosis. GI TRACT: Small hiatal hernia. No evidence of bowel ischemia.  No bowel obstruction. Nonspecific stranding about the rectum and trace fluid in the presacral space. Moderate stool in the rectum. PERITONEUM: No ascites or free air. RETROPERITONEUM: No retroperitoneal hematoma. BLADDER: No  acute abnormality. REPRODUCTIVE ORGANS: Enlarged prostate. No acute abnormality. BONES: Superior endplate compression fracture of L2 is similar to 11/05/2023 and new compared to 09/09/2023. Ankylosis of the SI joints. No acute traumatic fracture of the pelvis. IMPRESSION: 1. No evidence of acute GI bleeding or mesenteric ischemia. 2. Stranding in the presacral space and about the rectum is nonspecific. Moderate stool in the rectum. Correlate for fecal impaction and stercoral colitis. 3. Small hiatal hernia. 4. Enlarged prostate. Electronically signed by: Norman Gatlin MD 11/20/2023 12:38 AM EDT RP Workstation: [id number]   CT CHEST ABDOMEN PELVIS W CONTRAST Result Date: 11/20/2023 EXAM: CT ANGIOGRAM CHEST ABDOMEN PELVIS 11/20/2023 12:11:22 AM TECHNIQUE: CTA of the chest, abdomen, pelvis was performed after the administration of iohexol  (OMNIPAQUE ) 350 MG/ML injection. Multiplanar reformatted images are provided for review. MIP images are provided for review. Automated exposure control, iterative reconstruction, and/or weight based adjustment of the mA/kV was utilized to reduce the radiation dose to as low as reasonably achievable. COMPARISON: Same day chest and pelvis radiographs as well as CT from 11/05/2023. CLINICAL HISTORY: Polytrauma, blunt. Patient BIB [facility] c/o fall with head injury. Patient found by neighbor on ground, covered in dark red emesis. EMS reports patient vomited dark red emesis en-route. Patient arrives with lac to back of head, covered in dark red blood. FINDINGS: CTA CHEST: THORACIC AORTA: No evidence of acute aortic injury. No dissection or aneurysm. MEDIASTINUM: No pericardial effusion. No mediastinal hematoma. No acute traumatic injury to the heart or pericardium. The central airways are clear. Sternotomy and CABG. Coronary artery and aortic atherosclerotic calcifications. Small hiatal hernia. LUNGS: Atelectasis/scarring in the lung bases. No pleural effusion or pneumothorax. CHEST  WALL: Subacute or chronic posterior right 10th and 11th rib fractures. No acute fracture in the chest. No chest wall hematoma. CTA ABDOMEN/ PELVIS: ABDOMINAL AORTA: No acute traumatic injury of the aorta. No dissection or aneurysm. ILIAC ARTERIES: No acute traumatic injury of the iliac vessels. No active extravasation of contrast. HEPATOBILIARY: Cholelithiasis without evidence of acute cholecystitis. No acute traumatic injury. SPLEEN: No acute traumatic injury. PANCREAS: No acute traumatic injury. ADRENAL GLANDS: No acute traumatic injury. KIDNEYS: No acute traumatic injury. No hydronephrosis. GI TRACT: No acute traumatic injury of the bowel. No bowel obstruction. Nonspecific stranding about the rectum and trace fluid in the presacral space. Moderate stool in the rectum. PERITONEUM: No ascites or free air. RETROPERITONEUM: No retroperitoneal hematoma. BLADDER: No acute abnormality. REPRODUCTIVE ORGANS: Enlarged prostate. No acute abnormality. BONES: Superior endplate compression fracture of L2 is similar to 11/05/2023 and new compared to 09/09/2023. Ankylosis of the SI joints. No acute traumatic fracture of the  pelvis. IMPRESSION: 1. No acute traumatic injury in the chest, abdomen, or pelvis. 2. Superior endplate compression fracture of L2 is similar to 11/05/2023 and new compared to 09/09/2023. 3. Stranding in the presacral space and about the rectum is nonspecific. Moderate stool in the rectum. Correlate for fecal impaction and circ oral colitis. 4. Small hiatal hernia. 5. Enlarged prostate. Electronically signed by: Norman Gatlin MD 11/20/2023 12:30 AM EDT RP Workstation: [id number]   CT HEAD WO CONTRAST Result Date: 11/20/2023 EXAM: CT HEAD AND CERVICAL SPINE 11/20/2023 12:07:46 AM TECHNIQUE: CT of the head and cervical spine was performed without the administration of intravenous contrast. Multiplanar reformatted images are provided for review. Automated exposure control, iterative reconstruction, and/or  weight based adjustment of the mA/kV was utilized to reduce the radiation dose to as low as reasonably achievable. COMPARISON: None available. CLINICAL HISTORY: Head trauma, moderate-severe. Patient BIB [facility] c/o fall with head injury. Patient found by neighbor on ground, covered in dark red emesis. EMS reports patient vomited dark red emesis en-route. Patient arrives with lac to back of head, covered in dark red blood. FINDINGS: CT HEAD BRAIN AND VENTRICLES: Stable ventricular megaly. Chronic microvascular ischemia and generalized atrophy. ORBITS: No acute abnormality. SINUSES AND MASTOIDS: Chronic paranasal sinusitis. No mastoid effusion. SOFT TISSUES AND SKULL: Posterior right paramidline scalp hematoma laceration. No acute skull fracture. CT CERVICAL SPINE BONES AND ALIGNMENT: No evidence of acute fracture or traumatic listhesis. DEGENERATIVE CHANGES: Multilevel spondylosis and facet arthropathy. Disc space height loss and degenerative endplate changes are greatest at C6-C7 where there are moderate changes. No severe spinal canal narrowing. SOFT TISSUES: No prevertebral soft tissue swelling. IMPRESSION: 1. No acute intracranial abnormality. 2. Posterior right paramidline scalp hematoma and laceration. 3. No acute fracture or traumatic malalignment of the cervical spine. Electronically signed by: Norman Gatlin MD 11/20/2023 12:13 AM EDT RP Workstation: [id number]   CT CERVICAL SPINE WO CONTRAST Result Date: 11/20/2023 EXAM: CT HEAD AND CERVICAL SPINE 11/20/2023 12:07:46 AM TECHNIQUE: CT of the head and cervical spine was performed without the administration of intravenous contrast. Multiplanar reformatted images are provided for review. Automated exposure control, iterative reconstruction, and/or weight based adjustment of the mA/kV was utilized to reduce the radiation dose to as low as reasonably achievable. COMPARISON: None available. CLINICAL HISTORY: Head trauma, moderate-severe. Patient BIB [facility] c/o fall  with head injury. Patient found by neighbor on ground, covered in dark red emesis. EMS reports patient vomited dark red emesis en-route. Patient arrives with lac to back of head, covered in dark red blood. FINDINGS: CT HEAD BRAIN AND VENTRICLES: Stable ventricular megaly. Chronic microvascular ischemia and generalized atrophy. ORBITS: No acute abnormality. SINUSES AND MASTOIDS: Chronic paranasal sinusitis. No mastoid effusion. SOFT TISSUES AND SKULL: Posterior right paramidline scalp hematoma laceration. No acute skull fracture. CT CERVICAL SPINE BONES AND ALIGNMENT: No evidence of acute fracture or traumatic listhesis. DEGENERATIVE CHANGES: Multilevel spondylosis and facet arthropathy. Disc space height loss and degenerative endplate changes are greatest at C6-C7 where there are moderate changes. No severe spinal canal narrowing. SOFT TISSUES: No prevertebral soft tissue swelling. IMPRESSION: 1. No acute intracranial abnormality. 2. Posterior right paramidline scalp hematoma and laceration. 3. No acute fracture or traumatic malalignment of the cervical spine. Electronically signed by: Norman Gatlin MD 11/20/2023 12:13 AM EDT RP Workstation: [id number]   DG Pelvis Portable Result Date: 11/19/2023 CLINICAL DATA:  Status post fall. EXAM: PORTABLE PELVIS 1-2 VIEWS COMPARISON:  October 28, 2009 FINDINGS: There is no evidence of pelvic fracture or diastasis. No  pelvic bone lesions are seen. IMPRESSION: Negative. Electronically Signed   By: Suzen Dials M.D.   On: 11/19/2023 23:33   DG Chest Port 1 View Result Date: 11/19/2023 CLINICAL DATA:  Status post fall. EXAM: PORTABLE CHEST 1 VIEW COMPARISON:  November 05, 2023 FINDINGS: Multiple sternal wires and vascular clips are noted. The heart size and mediastinal contours are within normal limits. A radiopaque loop recorder device is seen. Low lung volumes are noted with mild elevation of the right hemidiaphragm. Both lungs are clear. The visualized skeletal structures  are unremarkable. IMPRESSION: 1. Evidence of prior median sternotomy/CABG. 2. No active cardiopulmonary disease. Electronically Signed   By: Suzen Dials M.D.   On: 11/19/2023 23:32   CT CHEST ABDOMEN PELVIS W CONTRAST Result Date: 11/05/2023 EXAM: CT CHEST, ABDOMEN AND PELVIS WITH CONTRAST 11/05/2023 09:06:21 PM TECHNIQUE: CT of the chest, abdomen and pelvis was performed with the administration of intravenous contrast. Multiplanar reformatted images are provided for review. Automated exposure control, iterative reconstruction, and/or weight based adjustment of the mA/kV was utilized to reduce the radiation dose to as low as reasonably achievable. COMPARISON: CT abdomen and pelvis dated 09/09/2023 and CTA chest dated 12/29/2021. CLINICAL HISTORY: Sepsis. Table formatting from the original note was not included. Images from the original note were not included. Pt bib EMS for onset of weakness through the day. Started the day using walker and was at baseline. This afternoon he was unable to get up and use walker. Took PRN lasix  this morning not normal occurrence. Pt had swelling in legs. Missed eliquis  for 2 nights, BP meds, and enlarged prostate. CBG 183. FINDINGS: CHEST: MEDIASTINUM: Mild cardiomegaly. Postsurgical changes related to prior CABG. Median sternotomy. THORACIC LYMPH NODES: No mediastinal, hilar or axillary lymphadenopathy. LUNGS AND PLEURA: Mild dependent atelectasis in the posterior right middle lobe, lingula, and bilateral lower lobes. No focal consolidation or pulmonary edema. No pleural effusion or pneumothorax. ABDOMEN AND PELVIS: LIVER: The liver is unremarkable. GALLBLADDER AND BILE DUCTS: Layering small gallstones (image 57), without associated inflammatory changes. No biliary ductal dilatation. SPLEEN: No acute abnormality. PANCREAS: No acute abnormality. ADRENAL GLANDS: No acute abnormality. KIDNEYS, URETERS AND BLADDER: 3.0 cm simple right upper pole renal cyst (image 54), benign  (Bosniak 1). Per consensus, no follow-up is needed for simple Bosniak type 1 and 2 renal cysts, unless the patient has a malignancy history or risk factors. No stones in the kidneys or ureters. No hydronephrosis. No perinephric or periureteral stranding. Thick walled bladder with mild perivesical stranding (image 102), possibly reflecting sequela of chronic bladder outlet obstruction, although superimposed cystitis as possible. GI AND BOWEL: Stomach demonstrates no acute abnormality. There is no bowel obstruction. Appendix is not discretely visualized. REPRODUCTIVE ORGANS: Prostatomegaly, with enlargement of the central gland indenting the base of the bladder, suggesting BPH. PERITONEUM AND RETROPERITONEUM: No ascites. No free air. VASCULATURE: Atherosclerotic calcifications of the abdominal aorta and branch vessels, although patent. Thoracic aortic atherosclerosis. ABDOMINAL AND PELVIS LYMPH NODES: No lymphadenopathy. BONES AND SOFT TISSUES: Healing right posterior tenth and 11th rib fractures. Healed right transverse process fractures at L1-3. Degenerative changes of the visualized thoracolumbar spine. IMPRESSION: 1. No acute findings in the chest. 2. Thick-walled bladder with mild perivesical stranding, possibly reflecting sequela of chronic bladder outlet obstruction or superimposed cystitis. 3. Prostatomegaly. Electronically signed by: Pinkie Pebbles MD 11/05/2023 09:19 PM EDT RP Workstation: [id number]   CT Head Wo Contrast Result Date: 11/05/2023 EXAM: CT HEAD WITHOUT CONTRAST 11/05/2023 09:06:21 PM TECHNIQUE: CT of the head  was performed without the administration of intravenous contrast. Automated exposure control, iterative reconstruction, and/or weight based adjustment of the mA/kV was utilized to reduce the radiation dose to as low as reasonably achievable. COMPARISON: 09/09/2023 CLINICAL HISTORY: Mental status change, unknown cause. Pt bib EMS for onset of weakness through the day. Started the day  using walker and was at baseline. This afternoon he was unable to get up and use walker. Took PRN lasix  this morning not normal occurrence. Pt had swelling in legs. Missed eliquis  for 2 nights, BP meds, and enlarged prostate. CBG 183. FINDINGS: BRAIN AND VENTRICLES: No acute hemorrhage. Gray-white differentiation is preserved. No extra-axial collection. No mass effect or midline shift. Global cortical and central atrophy. Stable ventriculomegaly. Mild subcortical and periventricular small vessel ischemic changes. Mild intracranial atherosclerosis. ORBITS: No acute abnormality. SINUSES: Mild mucosal thickening of the bilateral maxillary sinuses. SOFT TISSUES AND SKULL: No acute soft tissue abnormality. No skull fracture. IMPRESSION: 1. No acute intracranial abnormality. 2. Atrophy with stable ventriculomegaly. Small vessel ischemic changes. Electronically signed by: Pinkie Pebbles MD 11/05/2023 09:13 PM EDT RP Workstation: [id number]   DG Chest Port 1 View Result Date: 11/05/2023 CLINICAL DATA:  Weakness. EXAM: PORTABLE CHEST 1 VIEW COMPARISON:  November 29, 2022 FINDINGS: Multiple sternal wires and vascular clips are present. The heart size and mediastinal contours are within normal limits. A radiopaque loop recorder device is in place. Low lung volumes are noted. Mild linear scarring and/or atelectasis is seen within the left lung base. The visualized skeletal structures are unremarkable. IMPRESSION: 1. Evidence of prior median sternotomy/CABG. 2. Mild left basilar linear scarring and/or atelectasis. Electronically Signed   By: Suzen Dials M.D.   On: 11/05/2023 19:56     TODAY-DAY OF DISCHARGE:  Subjective:   Lynwood Rump today has no headache,no chest abdominal pain,no new weakness tingling or numbness, feels much better wants to go home today.   Objective:   Blood pressure (!) 154/76, pulse 61, temperature 98.8 F (37.1 C), temperature source Oral, resp. rate 19, height 5' 9 (1.753 m),  weight 77.6 kg, SpO2 95%.  Intake/Output Summary (Last 24 hours) at 11/22/2023 1330 Last data filed at 11/22/2023 0933 Gross per 24 hour  Intake 3 ml  Output 200 ml  Net -197 ml   Filed Weights   11/19/23 2308 11/21/23 0805  Weight: 76 kg 77.6 kg    Exam: Awake Alert, Oriented *3, No new F.N deficits, Normal affect [place].AT,PERRAL Supple Neck,No JVD, No cervical lymphadenopathy appriciated.  Symmetrical Chest wall movement, Good air movement bilaterally, CTAB RRR,No Gallops,Rubs or new Murmurs, No Parasternal Heave +ve B.Sounds, Abd Soft, Non tender, No organomegaly appriciated, No rebound -guarding or rigidity. No Cyanosis, Clubbing or edema, No new Rash or bruise   PERTINENT RADIOLOGIC STUDIES: EEG adult Result Date: 11/22/2023 Gregg Lek, MD     11/22/2023 10:48 AM Patient Name: [name] MRN: [id number] Epilepsy Attending: Lek Gregg Referring Physician/Provider: No ref. provider found     Date: 11/22/2023 Duration: 23 minutes Patient history: 89 y.o.  male with history of PAF on Eliquis , HTN, HLD, DM-2, CAD s/p CABG, CKD stage IIIb who apparently has had worsening cognition/frequent falls over the past several days-outpatient neurology evaluation is currently pending for potential NPH-presented to the hospital with fall/syncope/scalp laceration-and upper GI bleeding. Level of alertness: Awake, drowsy AEDs during EEG study: None Technical aspects: This EEG study was done with scalp electrodes positioned according to the 10-20 International system of electrode placement. Electrical activity was reviewed with  band pass filter of 1-70Hz , sensitivity of 7 uV/mm, display speed of 79mm/sec with a 60Hz  notched filter applied as appropriate. EEG data were recorded continuously and digitally stored.  Video monitoring was available and reviewed as appropriate. Description: The posterior dominant rhythm consists of 8.5 Hz activity of moderate voltage (25-35 uV) seen predominantly in posterior  head regions, symmetric and reactive to eye opening and eye closing. Drowsiness was characterized by attenuation of the posterior background rhythm. Sleep was not seen. Hyperventilation and photic stimulation were not performed.   ABNORMALITY -None IMPRESSION: This study is within normal limits.  No seizures or epileptiform discharges were seen throughout the recording. A normal interictal EEG does not exclude nor support the diagnosis of epilepsy. Pastor Falling MD Neurology    MR BRAIN WO CONTRAST Result Date: 11/22/2023 CLINICAL DATA:  Mental status change, unknown cause EXAM: MRI HEAD WITHOUT CONTRAST TECHNIQUE: Multiplanar, multiecho pulse sequences of the brain and surrounding structures were obtained without intravenous contrast. COMPARISON:  CT head 11/20/2023.  MRI head October 02, 2022. FINDINGS: Brain: No acute infarction, hemorrhage, extra-axial collection or mass lesion. Unchanged chronic microhemorrhage in the thalami and left cerebellum. Remote left cerebellar infarct. Similar chronic ventriculomegaly. Vascular: Major arterial flow voids are maintained at the skull base. Skull and upper cervical spine: Normal marrow signal. Sinuses/Orbits: Mild paranasal sinus mucosal thickening. No acute orbital findings. IMPRESSION: 1. No evidence of acute intracranial abnormality. 2. Unchanged chronic microvascular ischemic disease, chronic thalamic microhemorrhages, and nonspecific ventriculomegaly. 3. Remote left cerebellar infarct, new since 2024. Electronically Signed   By: Gilmore GORMAN Molt M.D.   On: 11/22/2023 00:00     PERTINENT LAB RESULTS: CBC: Recent Labs    11/21/23 0556 11/22/23 0616  WBC 9.9 8.1  HGB 11.3* 13.1  HCT 33.5* 38.8*  PLT 241 244   CMET CMP     Component Value Date/Time   NA 136 11/22/2023 0616   NA 139 12/07/2022 1116   K 3.7 11/22/2023 0616   CL 100 11/22/2023 0616   CO2 26 11/22/2023 0616   GLUCOSE 128 (H) 11/22/2023 0616   BUN 17 11/22/2023 0616   BUN 42 (H)  12/07/2022 1116   CREATININE 1.17 11/22/2023 0616   CALCIUM  8.6 (L) 11/22/2023 0616   PROT 5.1 (L) 11/21/2023 0556   ALBUMIN 2.4 (L) 11/21/2023 0556   AST 23 11/21/2023 0556   ALT 19 11/21/2023 0556   ALKPHOS 58 11/21/2023 0556   BILITOT 1.0 11/21/2023 0556   EGFR 44 (L) 12/07/2022 1116   GFRNONAA >60 11/22/2023 0616    GFR Estimated Creatinine Clearance: 44.5 mL/min (by C-G formula based on SCr of 1.17 mg/dL). Recent Labs    11/19/23 2309  LIPASE 28   Recent Labs    11/19/23 2309  CKTOTAL 30*   Invalid input(s): POCBNP No results for input(s): DDIMER in the last 72 hours. Recent Labs    11/21/23 1202  HGBA1C 7.1*   No results for input(s): CHOL, HDL, LDLCALC, TRIG, CHOLHDL, LDLDIRECT in the last 72 hours. Recent Labs    11/21/23 1202  TSH 1.169   Recent Labs    11/21/23 1202  VITAMINB12 298   Coags: Recent Labs    11/19/23 2309  INR 1.2   Microbiology: Recent Results (from the past 240 hours)  MRSA Next Gen by PCR, Nasal     Status: Abnormal   Collection Time: 11/21/23  5:05 AM   Specimen: Nasal Mucosa; Nasal Swab  Result Value Ref Range Status   MRSA  by PCR Next Gen DETECTED (A) NOT DETECTED Final    Comment: RESULT CALLED TO, READ BACK BY AND VERIFIED WITH: RN Johana HERO [phone] fcp (NOTE) The GeneXpert MRSA Assay (FDA approved for NASAL specimens only), is one component of a comprehensive MRSA colonization surveillance program. It is not intended to diagnose MRSA infection nor to guide or monitor treatment for MRSA infections. Test performance is not FDA approved in patients less than 29 years old. Performed at [facility], [address] [place], [place] [address]     FURTHER DISCHARGE INSTRUCTIONS:  Get Medicines reviewed and adjusted: Please take all your medications with you for your next visit with your Primary MD  Laboratory/radiological data: Please request your Primary MD to go over all hospital tests and  procedure/radiological results at the follow up, please ask your Primary MD to get all Hospital records sent to his/her office.  In some cases, they will be blood work, cultures and biopsy results pending at the time of your discharge. Please request that your primary care M.D. goes through all the records of your hospital data and follows up on these results.  Also Note the following: If you experience worsening of your admission symptoms, develop shortness of breath, life threatening emergency, suicidal or homicidal thoughts you must seek medical attention immediately by calling 911 or calling your MD immediately  if symptoms less severe.  You must read complete instructions/literature along with all the possible adverse reactions/side effects for all the Medicines you take and that have been prescribed to you. Take any new Medicines after you have completely understood and accpet all the possible adverse reactions/side effects.   Do not drive when taking Pain medications or sleeping medications (Benzodaizepines)  Do not take more than prescribed Pain, Sleep and Anxiety Medications. It is not advisable to combine anxiety,sleep and pain medications without talking with your primary care practitioner  Special Instructions: If you have smoked or chewed Tobacco  in the last 2 yrs please stop smoking, stop any regular Alcohol   and or any Recreational drug use.  Wear Seat belts while driving.  Please note: You were cared for by a hospitalist during your hospital stay. Once you are discharged, your primary care physician will handle any further medical issues. Please note that NO REFILLS for any discharge medications will be authorized once you are discharged, as it is imperative that you return to your primary care physician (or establish a relationship with a primary care physician if you do not have one) for your post hospital discharge needs so that they can reassess your need for medications and  monitor your lab values.  Total Time spent coordinating discharge including counseling, education and face to face time equals greater than 30 minutes.  Signed: Bronsyn Shappell 11/22/2023 1:30 PM

## 2023-11-22 NOTE — Addendum Note (Signed)
 Addended by: VICCI SELLER A on: 11/22/2023 08:37 AM   Modules accepted: Orders

## 2023-11-22 NOTE — Inpatient Diabetes Management (Signed)
 Inpatient Diabetes Program Recommendations  AACE/ADA: New Consensus Statement on Inpatient Glycemic Control (2015)  Target Ranges:  Prepandial:   less than 140 mg/dL      Peak postprandial:   less than 180 mg/dL (1-2 hours)      Critically ill patients:  140 - 180 mg/dL   Lab Results  Component Value Date   GLUCAP 218 (H) 11/22/2023   HGBA1C 7.1 (H) 11/21/2023    Review of Glycemic Control  Latest Reference Range & Units 11/21/23 09:19 11/21/23 12:34 11/21/23 16:06 11/21/23 23:01 11/22/23 08:27  Glucose-Capillary 70 - 99 mg/dL 71 833 (H) 806 (H) 741 (H) 218 (H)   Diabetes history:  DM 2 Outpatient Diabetes medications: Tresiba 20 units, Jardiance  25 mg Daily, metformin  1000 mg bid Current orders for Inpatient glycemic control:  Jardiance  25 mg Daily Novolog  0-9 units tid  A1c 7.1% on 8/5  Inpatient Diabetes Program Recommendations:    -  Add Novolog  hs scale  May need meal coverage as trends tend to increase after PO intake.  Thanks,  Clotilda Bull RN, MSN, BC-ADM Inpatient Diabetes Coordinator Team Pager [phone] (8a-5p)

## 2023-11-23 ENCOUNTER — Encounter (HOSPITAL_COMMUNITY): Payer: Self-pay | Admitting: Internal Medicine

## 2023-11-23 DIAGNOSIS — M62561 Muscle wasting and atrophy, not elsewhere classified, right lower leg: Secondary | ICD-10-CM | POA: Diagnosis not present

## 2023-11-23 DIAGNOSIS — R296 Repeated falls: Secondary | ICD-10-CM | POA: Diagnosis not present

## 2023-11-23 DIAGNOSIS — R41841 Cognitive communication deficit: Secondary | ICD-10-CM | POA: Diagnosis not present

## 2023-11-23 DIAGNOSIS — R4789 Other speech disturbances: Secondary | ICD-10-CM | POA: Diagnosis not present

## 2023-11-23 DIAGNOSIS — R4185 Anosognosia: Secondary | ICD-10-CM | POA: Diagnosis not present

## 2023-11-23 DIAGNOSIS — R488 Other symbolic dysfunctions: Secondary | ICD-10-CM | POA: Diagnosis not present

## 2023-11-23 DIAGNOSIS — R278 Other lack of coordination: Secondary | ICD-10-CM | POA: Diagnosis not present

## 2023-11-23 DIAGNOSIS — R2681 Unsteadiness on feet: Secondary | ICD-10-CM | POA: Diagnosis not present

## 2023-11-24 ENCOUNTER — Ambulatory Visit: Payer: Self-pay | Admitting: Internal Medicine

## 2023-11-24 DIAGNOSIS — R296 Repeated falls: Secondary | ICD-10-CM | POA: Diagnosis not present

## 2023-11-24 DIAGNOSIS — R278 Other lack of coordination: Secondary | ICD-10-CM | POA: Diagnosis not present

## 2023-11-24 DIAGNOSIS — R4789 Other speech disturbances: Secondary | ICD-10-CM | POA: Diagnosis not present

## 2023-11-24 DIAGNOSIS — R4185 Anosognosia: Secondary | ICD-10-CM | POA: Diagnosis not present

## 2023-11-24 DIAGNOSIS — R488 Other symbolic dysfunctions: Secondary | ICD-10-CM | POA: Diagnosis not present

## 2023-11-24 DIAGNOSIS — R41841 Cognitive communication deficit: Secondary | ICD-10-CM | POA: Diagnosis not present

## 2023-11-24 DIAGNOSIS — M62561 Muscle wasting and atrophy, not elsewhere classified, right lower leg: Secondary | ICD-10-CM | POA: Diagnosis not present

## 2023-11-24 DIAGNOSIS — R2681 Unsteadiness on feet: Secondary | ICD-10-CM | POA: Diagnosis not present

## 2023-11-27 DIAGNOSIS — R488 Other symbolic dysfunctions: Secondary | ICD-10-CM | POA: Diagnosis not present

## 2023-11-27 DIAGNOSIS — R4789 Other speech disturbances: Secondary | ICD-10-CM | POA: Diagnosis not present

## 2023-11-27 DIAGNOSIS — R296 Repeated falls: Secondary | ICD-10-CM | POA: Diagnosis not present

## 2023-11-27 DIAGNOSIS — R41841 Cognitive communication deficit: Secondary | ICD-10-CM | POA: Diagnosis not present

## 2023-11-27 DIAGNOSIS — M62561 Muscle wasting and atrophy, not elsewhere classified, right lower leg: Secondary | ICD-10-CM | POA: Diagnosis not present

## 2023-11-27 DIAGNOSIS — R4185 Anosognosia: Secondary | ICD-10-CM | POA: Diagnosis not present

## 2023-11-27 DIAGNOSIS — R278 Other lack of coordination: Secondary | ICD-10-CM | POA: Diagnosis not present

## 2023-11-27 DIAGNOSIS — R2681 Unsteadiness on feet: Secondary | ICD-10-CM | POA: Diagnosis not present

## 2023-11-28 DIAGNOSIS — E1129 Type 2 diabetes mellitus with other diabetic kidney complication: Secondary | ICD-10-CM | POA: Diagnosis not present

## 2023-11-28 DIAGNOSIS — G9389 Other specified disorders of brain: Secondary | ICD-10-CM | POA: Diagnosis not present

## 2023-11-28 DIAGNOSIS — R2681 Unsteadiness on feet: Secondary | ICD-10-CM | POA: Diagnosis not present

## 2023-11-28 DIAGNOSIS — R488 Other symbolic dysfunctions: Secondary | ICD-10-CM | POA: Diagnosis not present

## 2023-11-28 DIAGNOSIS — E785 Hyperlipidemia, unspecified: Secondary | ICD-10-CM | POA: Diagnosis not present

## 2023-11-28 DIAGNOSIS — Z794 Long term (current) use of insulin: Secondary | ICD-10-CM | POA: Diagnosis not present

## 2023-11-28 DIAGNOSIS — M62561 Muscle wasting and atrophy, not elsewhere classified, right lower leg: Secondary | ICD-10-CM | POA: Diagnosis not present

## 2023-11-28 DIAGNOSIS — N390 Urinary tract infection, site not specified: Secondary | ICD-10-CM | POA: Diagnosis not present

## 2023-11-28 DIAGNOSIS — S0101XA Laceration without foreign body of scalp, initial encounter: Secondary | ICD-10-CM | POA: Diagnosis not present

## 2023-11-28 DIAGNOSIS — I480 Paroxysmal atrial fibrillation: Secondary | ICD-10-CM | POA: Diagnosis not present

## 2023-11-28 DIAGNOSIS — I5032 Chronic diastolic (congestive) heart failure: Secondary | ICD-10-CM | POA: Diagnosis not present

## 2023-11-28 DIAGNOSIS — R4185 Anosognosia: Secondary | ICD-10-CM | POA: Diagnosis not present

## 2023-11-28 DIAGNOSIS — R41841 Cognitive communication deficit: Secondary | ICD-10-CM | POA: Diagnosis not present

## 2023-11-28 DIAGNOSIS — I25810 Atherosclerosis of coronary artery bypass graft(s) without angina pectoris: Secondary | ICD-10-CM | POA: Diagnosis not present

## 2023-11-28 DIAGNOSIS — R4789 Other speech disturbances: Secondary | ICD-10-CM | POA: Diagnosis not present

## 2023-11-28 DIAGNOSIS — R410 Disorientation, unspecified: Secondary | ICD-10-CM | POA: Diagnosis not present

## 2023-11-28 DIAGNOSIS — I129 Hypertensive chronic kidney disease with stage 1 through stage 4 chronic kidney disease, or unspecified chronic kidney disease: Secondary | ICD-10-CM | POA: Diagnosis not present

## 2023-11-28 DIAGNOSIS — Z13820 Encounter for screening for osteoporosis: Secondary | ICD-10-CM | POA: Diagnosis not present

## 2023-11-28 DIAGNOSIS — R278 Other lack of coordination: Secondary | ICD-10-CM | POA: Diagnosis not present

## 2023-11-28 DIAGNOSIS — R296 Repeated falls: Secondary | ICD-10-CM | POA: Diagnosis not present

## 2023-11-28 DIAGNOSIS — N1831 Chronic kidney disease, stage 3a: Secondary | ICD-10-CM | POA: Diagnosis not present

## 2023-11-28 DIAGNOSIS — E782 Mixed hyperlipidemia: Secondary | ICD-10-CM | POA: Diagnosis not present

## 2023-11-28 DIAGNOSIS — N401 Enlarged prostate with lower urinary tract symptoms: Secondary | ICD-10-CM | POA: Diagnosis not present

## 2023-11-28 DIAGNOSIS — E1122 Type 2 diabetes mellitus with diabetic chronic kidney disease: Secondary | ICD-10-CM | POA: Diagnosis not present

## 2023-11-28 DIAGNOSIS — I130 Hypertensive heart and chronic kidney disease with heart failure and stage 1 through stage 4 chronic kidney disease, or unspecified chronic kidney disease: Secondary | ICD-10-CM | POA: Diagnosis not present

## 2023-11-29 DIAGNOSIS — R488 Other symbolic dysfunctions: Secondary | ICD-10-CM | POA: Diagnosis not present

## 2023-11-29 DIAGNOSIS — R41841 Cognitive communication deficit: Secondary | ICD-10-CM | POA: Diagnosis not present

## 2023-11-29 DIAGNOSIS — R296 Repeated falls: Secondary | ICD-10-CM | POA: Diagnosis not present

## 2023-11-29 DIAGNOSIS — R278 Other lack of coordination: Secondary | ICD-10-CM | POA: Diagnosis not present

## 2023-11-29 DIAGNOSIS — R4789 Other speech disturbances: Secondary | ICD-10-CM | POA: Diagnosis not present

## 2023-11-29 DIAGNOSIS — R2681 Unsteadiness on feet: Secondary | ICD-10-CM | POA: Diagnosis not present

## 2023-11-29 DIAGNOSIS — R4185 Anosognosia: Secondary | ICD-10-CM | POA: Diagnosis not present

## 2023-11-29 DIAGNOSIS — M62561 Muscle wasting and atrophy, not elsewhere classified, right lower leg: Secondary | ICD-10-CM | POA: Diagnosis not present

## 2023-11-30 DIAGNOSIS — M62561 Muscle wasting and atrophy, not elsewhere classified, right lower leg: Secondary | ICD-10-CM | POA: Diagnosis not present

## 2023-11-30 DIAGNOSIS — R4185 Anosognosia: Secondary | ICD-10-CM | POA: Diagnosis not present

## 2023-11-30 DIAGNOSIS — R296 Repeated falls: Secondary | ICD-10-CM | POA: Diagnosis not present

## 2023-11-30 DIAGNOSIS — R2681 Unsteadiness on feet: Secondary | ICD-10-CM | POA: Diagnosis not present

## 2023-11-30 DIAGNOSIS — R41841 Cognitive communication deficit: Secondary | ICD-10-CM | POA: Diagnosis not present

## 2023-11-30 DIAGNOSIS — R4789 Other speech disturbances: Secondary | ICD-10-CM | POA: Diagnosis not present

## 2023-11-30 DIAGNOSIS — R278 Other lack of coordination: Secondary | ICD-10-CM | POA: Diagnosis not present

## 2023-11-30 DIAGNOSIS — R488 Other symbolic dysfunctions: Secondary | ICD-10-CM | POA: Diagnosis not present

## 2023-12-01 DIAGNOSIS — R296 Repeated falls: Secondary | ICD-10-CM | POA: Diagnosis not present

## 2023-12-01 DIAGNOSIS — R41841 Cognitive communication deficit: Secondary | ICD-10-CM | POA: Diagnosis not present

## 2023-12-01 DIAGNOSIS — R4185 Anosognosia: Secondary | ICD-10-CM | POA: Diagnosis not present

## 2023-12-01 DIAGNOSIS — R488 Other symbolic dysfunctions: Secondary | ICD-10-CM | POA: Diagnosis not present

## 2023-12-01 DIAGNOSIS — R4789 Other speech disturbances: Secondary | ICD-10-CM | POA: Diagnosis not present

## 2023-12-01 DIAGNOSIS — M62561 Muscle wasting and atrophy, not elsewhere classified, right lower leg: Secondary | ICD-10-CM | POA: Diagnosis not present

## 2023-12-01 DIAGNOSIS — R2681 Unsteadiness on feet: Secondary | ICD-10-CM | POA: Diagnosis not present

## 2023-12-01 DIAGNOSIS — R278 Other lack of coordination: Secondary | ICD-10-CM | POA: Diagnosis not present

## 2023-12-04 DIAGNOSIS — R488 Other symbolic dysfunctions: Secondary | ICD-10-CM | POA: Diagnosis not present

## 2023-12-04 DIAGNOSIS — R2681 Unsteadiness on feet: Secondary | ICD-10-CM | POA: Diagnosis not present

## 2023-12-04 DIAGNOSIS — R296 Repeated falls: Secondary | ICD-10-CM | POA: Diagnosis not present

## 2023-12-04 DIAGNOSIS — R4185 Anosognosia: Secondary | ICD-10-CM | POA: Diagnosis not present

## 2023-12-04 DIAGNOSIS — R41841 Cognitive communication deficit: Secondary | ICD-10-CM | POA: Diagnosis not present

## 2023-12-04 DIAGNOSIS — R4789 Other speech disturbances: Secondary | ICD-10-CM | POA: Diagnosis not present

## 2023-12-04 DIAGNOSIS — M62561 Muscle wasting and atrophy, not elsewhere classified, right lower leg: Secondary | ICD-10-CM | POA: Diagnosis not present

## 2023-12-04 DIAGNOSIS — R278 Other lack of coordination: Secondary | ICD-10-CM | POA: Diagnosis not present

## 2023-12-05 DIAGNOSIS — Z0000 Encounter for general adult medical examination without abnormal findings: Secondary | ICD-10-CM | POA: Diagnosis not present

## 2023-12-05 DIAGNOSIS — F03B0 Unspecified dementia, moderate, without behavioral disturbance, psychotic disturbance, mood disturbance, and anxiety: Secondary | ICD-10-CM | POA: Diagnosis not present

## 2023-12-05 DIAGNOSIS — R278 Other lack of coordination: Secondary | ICD-10-CM | POA: Diagnosis not present

## 2023-12-05 DIAGNOSIS — Z794 Long term (current) use of insulin: Secondary | ICD-10-CM | POA: Diagnosis not present

## 2023-12-05 DIAGNOSIS — R4789 Other speech disturbances: Secondary | ICD-10-CM | POA: Diagnosis not present

## 2023-12-05 DIAGNOSIS — R2681 Unsteadiness on feet: Secondary | ICD-10-CM | POA: Diagnosis not present

## 2023-12-05 DIAGNOSIS — L89152 Pressure ulcer of sacral region, stage 2: Secondary | ICD-10-CM | POA: Diagnosis not present

## 2023-12-05 DIAGNOSIS — I480 Paroxysmal atrial fibrillation: Secondary | ICD-10-CM | POA: Diagnosis not present

## 2023-12-05 DIAGNOSIS — Z1339 Encounter for screening examination for other mental health and behavioral disorders: Secondary | ICD-10-CM | POA: Diagnosis not present

## 2023-12-05 DIAGNOSIS — I129 Hypertensive chronic kidney disease with stage 1 through stage 4 chronic kidney disease, or unspecified chronic kidney disease: Secondary | ICD-10-CM | POA: Diagnosis not present

## 2023-12-05 DIAGNOSIS — F3341 Major depressive disorder, recurrent, in partial remission: Secondary | ICD-10-CM | POA: Diagnosis not present

## 2023-12-05 DIAGNOSIS — Z87898 Personal history of other specified conditions: Secondary | ICD-10-CM | POA: Diagnosis not present

## 2023-12-05 DIAGNOSIS — Z1331 Encounter for screening for depression: Secondary | ICD-10-CM | POA: Diagnosis not present

## 2023-12-05 DIAGNOSIS — I25810 Atherosclerosis of coronary artery bypass graft(s) without angina pectoris: Secondary | ICD-10-CM | POA: Diagnosis not present

## 2023-12-05 DIAGNOSIS — N1831 Chronic kidney disease, stage 3a: Secondary | ICD-10-CM | POA: Diagnosis not present

## 2023-12-05 DIAGNOSIS — Z860100 Personal history of colon polyps, unspecified: Secondary | ICD-10-CM | POA: Diagnosis not present

## 2023-12-05 DIAGNOSIS — R4185 Anosognosia: Secondary | ICD-10-CM | POA: Diagnosis not present

## 2023-12-05 DIAGNOSIS — G9389 Other specified disorders of brain: Secondary | ICD-10-CM | POA: Diagnosis not present

## 2023-12-05 DIAGNOSIS — E1122 Type 2 diabetes mellitus with diabetic chronic kidney disease: Secondary | ICD-10-CM | POA: Diagnosis not present

## 2023-12-05 DIAGNOSIS — D6869 Other thrombophilia: Secondary | ICD-10-CM | POA: Diagnosis not present

## 2023-12-05 DIAGNOSIS — R488 Other symbolic dysfunctions: Secondary | ICD-10-CM | POA: Diagnosis not present

## 2023-12-05 DIAGNOSIS — R296 Repeated falls: Secondary | ICD-10-CM | POA: Diagnosis not present

## 2023-12-05 DIAGNOSIS — M62561 Muscle wasting and atrophy, not elsewhere classified, right lower leg: Secondary | ICD-10-CM | POA: Diagnosis not present

## 2023-12-05 DIAGNOSIS — E782 Mixed hyperlipidemia: Secondary | ICD-10-CM | POA: Diagnosis not present

## 2023-12-05 DIAGNOSIS — R41841 Cognitive communication deficit: Secondary | ICD-10-CM | POA: Diagnosis not present

## 2023-12-06 DIAGNOSIS — R4185 Anosognosia: Secondary | ICD-10-CM | POA: Diagnosis not present

## 2023-12-06 DIAGNOSIS — R4789 Other speech disturbances: Secondary | ICD-10-CM | POA: Diagnosis not present

## 2023-12-06 DIAGNOSIS — R296 Repeated falls: Secondary | ICD-10-CM | POA: Diagnosis not present

## 2023-12-06 DIAGNOSIS — M62561 Muscle wasting and atrophy, not elsewhere classified, right lower leg: Secondary | ICD-10-CM | POA: Diagnosis not present

## 2023-12-06 DIAGNOSIS — R488 Other symbolic dysfunctions: Secondary | ICD-10-CM | POA: Diagnosis not present

## 2023-12-06 DIAGNOSIS — R41841 Cognitive communication deficit: Secondary | ICD-10-CM | POA: Diagnosis not present

## 2023-12-06 DIAGNOSIS — R278 Other lack of coordination: Secondary | ICD-10-CM | POA: Diagnosis not present

## 2023-12-06 DIAGNOSIS — R2681 Unsteadiness on feet: Secondary | ICD-10-CM | POA: Diagnosis not present

## 2023-12-07 ENCOUNTER — Encounter: Payer: Self-pay | Admitting: *Deleted

## 2023-12-07 DIAGNOSIS — R2681 Unsteadiness on feet: Secondary | ICD-10-CM | POA: Diagnosis not present

## 2023-12-07 DIAGNOSIS — M62561 Muscle wasting and atrophy, not elsewhere classified, right lower leg: Secondary | ICD-10-CM | POA: Diagnosis not present

## 2023-12-07 DIAGNOSIS — R278 Other lack of coordination: Secondary | ICD-10-CM | POA: Diagnosis not present

## 2023-12-07 DIAGNOSIS — R4789 Other speech disturbances: Secondary | ICD-10-CM | POA: Diagnosis not present

## 2023-12-07 DIAGNOSIS — R296 Repeated falls: Secondary | ICD-10-CM | POA: Diagnosis not present

## 2023-12-07 DIAGNOSIS — R488 Other symbolic dysfunctions: Secondary | ICD-10-CM | POA: Diagnosis not present

## 2023-12-07 DIAGNOSIS — R4185 Anosognosia: Secondary | ICD-10-CM | POA: Diagnosis not present

## 2023-12-07 DIAGNOSIS — R41841 Cognitive communication deficit: Secondary | ICD-10-CM | POA: Diagnosis not present

## 2023-12-08 DIAGNOSIS — R278 Other lack of coordination: Secondary | ICD-10-CM | POA: Diagnosis not present

## 2023-12-08 DIAGNOSIS — R41841 Cognitive communication deficit: Secondary | ICD-10-CM | POA: Diagnosis not present

## 2023-12-08 DIAGNOSIS — R4185 Anosognosia: Secondary | ICD-10-CM | POA: Diagnosis not present

## 2023-12-08 DIAGNOSIS — M62561 Muscle wasting and atrophy, not elsewhere classified, right lower leg: Secondary | ICD-10-CM | POA: Diagnosis not present

## 2023-12-08 DIAGNOSIS — R2681 Unsteadiness on feet: Secondary | ICD-10-CM | POA: Diagnosis not present

## 2023-12-08 DIAGNOSIS — R296 Repeated falls: Secondary | ICD-10-CM | POA: Diagnosis not present

## 2023-12-08 DIAGNOSIS — R4789 Other speech disturbances: Secondary | ICD-10-CM | POA: Diagnosis not present

## 2023-12-08 DIAGNOSIS — R488 Other symbolic dysfunctions: Secondary | ICD-10-CM | POA: Diagnosis not present

## 2023-12-10 DIAGNOSIS — E1129 Type 2 diabetes mellitus with other diabetic kidney complication: Secondary | ICD-10-CM | POA: Diagnosis not present

## 2023-12-11 DIAGNOSIS — R4185 Anosognosia: Secondary | ICD-10-CM | POA: Diagnosis not present

## 2023-12-11 DIAGNOSIS — R4789 Other speech disturbances: Secondary | ICD-10-CM | POA: Diagnosis not present

## 2023-12-11 DIAGNOSIS — R488 Other symbolic dysfunctions: Secondary | ICD-10-CM | POA: Diagnosis not present

## 2023-12-11 DIAGNOSIS — R296 Repeated falls: Secondary | ICD-10-CM | POA: Diagnosis not present

## 2023-12-11 DIAGNOSIS — R2681 Unsteadiness on feet: Secondary | ICD-10-CM | POA: Diagnosis not present

## 2023-12-11 DIAGNOSIS — R41841 Cognitive communication deficit: Secondary | ICD-10-CM | POA: Diagnosis not present

## 2023-12-11 DIAGNOSIS — M62561 Muscle wasting and atrophy, not elsewhere classified, right lower leg: Secondary | ICD-10-CM | POA: Diagnosis not present

## 2023-12-11 DIAGNOSIS — R278 Other lack of coordination: Secondary | ICD-10-CM | POA: Diagnosis not present

## 2023-12-12 DIAGNOSIS — R488 Other symbolic dysfunctions: Secondary | ICD-10-CM | POA: Diagnosis not present

## 2023-12-12 DIAGNOSIS — R4185 Anosognosia: Secondary | ICD-10-CM | POA: Diagnosis not present

## 2023-12-12 DIAGNOSIS — R4789 Other speech disturbances: Secondary | ICD-10-CM | POA: Diagnosis not present

## 2023-12-12 DIAGNOSIS — M62561 Muscle wasting and atrophy, not elsewhere classified, right lower leg: Secondary | ICD-10-CM | POA: Diagnosis not present

## 2023-12-12 DIAGNOSIS — R278 Other lack of coordination: Secondary | ICD-10-CM | POA: Diagnosis not present

## 2023-12-12 DIAGNOSIS — R41841 Cognitive communication deficit: Secondary | ICD-10-CM | POA: Diagnosis not present

## 2023-12-12 DIAGNOSIS — R2681 Unsteadiness on feet: Secondary | ICD-10-CM | POA: Diagnosis not present

## 2023-12-12 DIAGNOSIS — R296 Repeated falls: Secondary | ICD-10-CM | POA: Diagnosis not present

## 2023-12-13 DIAGNOSIS — R41841 Cognitive communication deficit: Secondary | ICD-10-CM | POA: Diagnosis not present

## 2023-12-13 DIAGNOSIS — R4789 Other speech disturbances: Secondary | ICD-10-CM | POA: Diagnosis not present

## 2023-12-13 DIAGNOSIS — R4185 Anosognosia: Secondary | ICD-10-CM | POA: Diagnosis not present

## 2023-12-13 DIAGNOSIS — R488 Other symbolic dysfunctions: Secondary | ICD-10-CM | POA: Diagnosis not present

## 2023-12-13 DIAGNOSIS — R2681 Unsteadiness on feet: Secondary | ICD-10-CM | POA: Diagnosis not present

## 2023-12-13 DIAGNOSIS — R278 Other lack of coordination: Secondary | ICD-10-CM | POA: Diagnosis not present

## 2023-12-13 DIAGNOSIS — R296 Repeated falls: Secondary | ICD-10-CM | POA: Diagnosis not present

## 2023-12-13 DIAGNOSIS — M62561 Muscle wasting and atrophy, not elsewhere classified, right lower leg: Secondary | ICD-10-CM | POA: Diagnosis not present

## 2023-12-14 DIAGNOSIS — R41841 Cognitive communication deficit: Secondary | ICD-10-CM | POA: Diagnosis not present

## 2023-12-14 DIAGNOSIS — R296 Repeated falls: Secondary | ICD-10-CM | POA: Diagnosis not present

## 2023-12-14 DIAGNOSIS — R488 Other symbolic dysfunctions: Secondary | ICD-10-CM | POA: Diagnosis not present

## 2023-12-14 DIAGNOSIS — R4789 Other speech disturbances: Secondary | ICD-10-CM | POA: Diagnosis not present

## 2023-12-14 DIAGNOSIS — M62561 Muscle wasting and atrophy, not elsewhere classified, right lower leg: Secondary | ICD-10-CM | POA: Diagnosis not present

## 2023-12-14 DIAGNOSIS — R4185 Anosognosia: Secondary | ICD-10-CM | POA: Diagnosis not present

## 2023-12-14 DIAGNOSIS — R2681 Unsteadiness on feet: Secondary | ICD-10-CM | POA: Diagnosis not present

## 2023-12-14 DIAGNOSIS — R278 Other lack of coordination: Secondary | ICD-10-CM | POA: Diagnosis not present

## 2023-12-15 DIAGNOSIS — R2681 Unsteadiness on feet: Secondary | ICD-10-CM | POA: Diagnosis not present

## 2023-12-15 DIAGNOSIS — R41841 Cognitive communication deficit: Secondary | ICD-10-CM | POA: Diagnosis not present

## 2023-12-15 DIAGNOSIS — R4789 Other speech disturbances: Secondary | ICD-10-CM | POA: Diagnosis not present

## 2023-12-15 DIAGNOSIS — R488 Other symbolic dysfunctions: Secondary | ICD-10-CM | POA: Diagnosis not present

## 2023-12-15 DIAGNOSIS — M62561 Muscle wasting and atrophy, not elsewhere classified, right lower leg: Secondary | ICD-10-CM | POA: Diagnosis not present

## 2023-12-15 DIAGNOSIS — R296 Repeated falls: Secondary | ICD-10-CM | POA: Diagnosis not present

## 2023-12-15 DIAGNOSIS — R4185 Anosognosia: Secondary | ICD-10-CM | POA: Diagnosis not present

## 2023-12-15 DIAGNOSIS — R278 Other lack of coordination: Secondary | ICD-10-CM | POA: Diagnosis not present

## 2023-12-17 DIAGNOSIS — R488 Other symbolic dysfunctions: Secondary | ICD-10-CM | POA: Diagnosis not present

## 2023-12-17 DIAGNOSIS — R41841 Cognitive communication deficit: Secondary | ICD-10-CM | POA: Diagnosis not present

## 2023-12-17 DIAGNOSIS — R4789 Other speech disturbances: Secondary | ICD-10-CM | POA: Diagnosis not present

## 2023-12-17 DIAGNOSIS — R2681 Unsteadiness on feet: Secondary | ICD-10-CM | POA: Diagnosis not present

## 2023-12-17 DIAGNOSIS — R296 Repeated falls: Secondary | ICD-10-CM | POA: Diagnosis not present

## 2023-12-17 DIAGNOSIS — M62561 Muscle wasting and atrophy, not elsewhere classified, right lower leg: Secondary | ICD-10-CM | POA: Diagnosis not present

## 2023-12-17 DIAGNOSIS — R4185 Anosognosia: Secondary | ICD-10-CM | POA: Diagnosis not present

## 2023-12-17 DIAGNOSIS — R278 Other lack of coordination: Secondary | ICD-10-CM | POA: Diagnosis not present

## 2023-12-18 NOTE — Progress Notes (Unsigned)
 [facility]  PATIENT: [name] DOB: [birth year]  REFERRING DOCTOR OR PCP: Elsie Gentry, MD SOURCE: Patient, notes from primary care, notes from emergency room, imaging and lab reports, imaging studies personally reviewed.  _________________________________   HISTORICAL  CHIEF COMPLAINT:  Chief Complaint  Patient presents with   New Patient (Initial Visit)    Pt in room 10. Daughter in room. Paper referral ventriculomegaly on CT, worsening confusion, gait instability, incontinence. Pt lives at [facility] [place] has caregiver during the day.     HISTORY OF PRESENT ILLNESS:  I had the pleasure of seeing your patient, [name] at [facility] for neurologic consultation regarding his history of stroke and more recent history of progressive gait disturbance and other symptoms.  He is an 89 year old man who had a stroke in June 2025.   He had a glazed look on his face and then had reduced peripheral vision.    At the time his gait was just mildly slowed.  Since January 2025, gait has progressively worsened.      He also has had more urinary incontinence since the stroke in 2024 but progressively worsened further.    Although he has had more cognitive issues since the stroke, this has also progressively worsened tin 2025.    He has had apraxia with difficulty using a remote control and more issues with more complicated tasks.      He had a stroke 10/01/2022 with diplopia and hemianopsia CT perfusion had shown a 19 mL penumbra with no core infarct and CT angiogram had shown intracranial stenosis.  He underwent mechanical thrombectomy for the left PCA occlusion and also received  TNK with complete recanalization.   Patient improved though his gait was worse afterwards.  He did PT.  He has had multiple falls over the past few months.  This is led to several emergency room visits.  CT scan of the head on 11/05/2023 showed ventriculomegaly with a pattern  consistent with normal pressure hydrocephalus.  MRI of the brain 11/21/2023 showed ventriculomegaly and transependymal flow there were no acute findings.  According to his daughter, gait was worse after the stroke but progressively worsened 20 2025.  He does have issues with urinary incontinence.  This seemed to have worsened with the stroke and then progressively worsened more this year.  Cognitive issues similarly worsened after the stroke and worsened more this year over the past few months.    He has had numbness in his legs.  This did seem to worsen after the stroke.  He has a h/o paroxysmal A-Fib and had been on coumadin and more recently on Eliquis .   However, due to multiple falls. It has been stopped and he was placed on Plavix .    Images personally reviewed. MRI of the brain 11/21/2023  showed showed ventriculomegaly with crowding of the sulci at the vertex and a reduced callosal angle.  Mild generalized cortical atrophy is noted.  This MRI also shows evolution of the acute strokes noted in 2024.SABRA  There is moderate chronic microvascular ischemic change and possibly mild transependymal flow.  Some chronic microhemorrhages are noted in the thalamus and hemispheres consistent with hypertensive change  CT scan of the head 11/05/2023 shows ventriculomegaly.  There is crowding of the sulci at the vertex and a decrease callosal angle often seen t with normal pressure hydrocephalus.  There are some generalized cortical atrophy.  There is white matter hypodense changes which could represent chronic microvascular ischemia,  possibly intermixed with transependymal flow.  MRI 10/02/2022 showed ventriculomegaly with crowding of the sulci at the vertex and a reduced callosal angle.  Mild generalized cortical atrophy is noted.  This MRI also shows scattered small acute strokes in the cerebellum, occipital lobes and left lateral thalamus.  There is moderate chronic microvascular ischemic change and possibly mild  transependymal flow.  CT angiogram 10/01/2022 showed occlusion of the proximal left P1 segment and moderate stenosis in the distal left P2 segment.  There is also severe stenosis in the left supraclinoid ICA and moderate stenosis in the right supraclinoid ICA.  Severe stenosis at the origin of the right PICA and mild stenosis in the basilar artery.   REVIEW OF SYSTEMS: Constitutional: No fevers, chills, sweats, or change in appetite Eyes: After the stroke he had a visual field cut  ear, nose and throat: No hearing loss, ear pain, nasal congestion, sore throat Cardiovascular: No chest pain, palpitations.  History of paroxysmal A-fib Respiratory:  No shortness of breath at rest or with exertion.   No wheezes GastrointestinaI: No nausea, vomiting, diarrhea, abdominal pain, fecal incontinence Genitourinary:  No dysuria, urinary retention or frequency.  No nocturia. Musculoskeletal:  No neck pain, back pain Integumentary: No rash, pruritus, skin lesions Neurological: as above Psychiatric: No depression at this time.  No anxiety Endocrine: No palpitations, diaphoresis, change in appetite, change in weigh or increased thirst Hematologic/Lymphatic:  No anemia, purpura, petechiae. Allergic/Immunologic: No itchy/runny eyes, nasal congestion, recent allergic reactions, rashes  ALLERGIES: Allergies  Allergen Reactions   Ketorolac Tromethamine Other (See Comments)    nephrotic syndrome    HOME MEDICATIONS:  Current Outpatient Medications:    buPROPion  (WELLBUTRIN  XL) 150 MG 24 hr tablet, Take 150 mg by mouth every morning., Disp: , Rfl:    clopidogrel  (PLAVIX ) 75 MG tablet, Take 1 tablet (75 mg total) by mouth daily., Disp: 30 tablet, Rfl: 2   escitalopram  (LEXAPRO ) 10 MG tablet, Take 10 mg by mouth daily., Disp: , Rfl:    ezetimibe  (ZETIA ) 10 MG tablet, Take 10 mg by mouth daily., Disp: , Rfl:    insulin  degludec (TRESIBA FLEXTOUCH) 200 UNIT/ML FlexTouch Pen, Inject 20 Units into the skin as  needed (if BS is above 200)., Disp: , Rfl:    mirabegron  ER (MYRBETRIQ ) 50 MG TB24 tablet, Take 1 tablet (50 mg total) by mouth daily., Disp: 30 tablet, Rfl: 0   olmesartan  (BENICAR ) 20 MG tablet, Take 1 tablet (20 mg total) by mouth daily., Disp: 30 tablet, Rfl: 0   polyethylene glycol powder (GLYCOLAX /MIRALAX ) 17 GM/SCOOP powder, Take 17 g by mouth daily., Disp: 238 g, Rfl: 0   senna (SENOKOT) 8.6 MG TABS tablet, Take 1 tablet (8.6 mg total) by mouth at bedtime., Disp: 120 tablet, Rfl: 0   tadalafil  (CIALIS ) 5 MG tablet, Take 1 tablet (5 mg total) by mouth daily., Disp: 10 tablet, Rfl: 0   tamsulosin  (FLOMAX ) 0.4 MG CAPS capsule, Take 1 capsule (0.4 mg total) by mouth daily after supper. (Patient taking differently: Take 0.4 mg by mouth at bedtime.), Disp: 30 capsule, Rfl: 0   acebutolol  (SECTRAL ) 200 MG capsule, Take 1 capsule (200 mg total) by mouth 2 (two) times daily. (Patient not taking: Reported on 12/20/2023), Disp: 180 capsule, Rfl: 1   atorvastatin  (LIPITOR ) 40 MG tablet, Take 40 mg by mouth every evening. (Patient not taking: Reported on 12/20/2023), Disp: , Rfl:    cyanocobalamin  (VITAMIN B12) 1000 MCG tablet, Take 1 tablet (1,000 mcg total) by mouth  daily. (Patient not taking: Reported on 12/20/2023), Disp: 30 tablet, Rfl: 2   empagliflozin  (JARDIANCE ) 25 MG TABS tablet, Take 1 tablet (25 mg total) by mouth daily. (Patient not taking: Reported on 12/20/2023), Disp: 30 tablet, Rfl: 0   furosemide  (LASIX ) 40 MG tablet, Take 1 tablet (40 mg total) by mouth daily as needed for edema. (Patient not taking: Reported on 12/20/2023), Disp: , Rfl:    metFORMIN  (GLUCOPHAGE ) 1000 MG tablet, Take 1,000 mg by mouth 2 (two) times daily with a meal. (Patient not taking: Reported on 12/20/2023), Disp: , Rfl:    pantoprazole  (PROTONIX ) 40 MG tablet, Take 1 tablet (40 mg total) by mouth daily. (Patient not taking: Reported on 12/20/2023), Disp: 30 tablet, Rfl: 2  PAST MEDICAL HISTORY: Past Medical History:  Diagnosis  Date   Acute renal failure (HCC)    secondary to nephrotic syndrome post cath, resolved with steroids   Anxiety    Arthritis    CAD (coronary artery disease) 04/2006   LIMA to LAD, sequential SVG to first, second, and third OM's, sequential SVG to mid RCA and PDA    Depression    Diabetes mellitus without complication [facility])    ED (erectile dysfunction)    HTN (hypertension)    Hx of adenomatous colonic polyps    Hyperlipidemia    Loop recorder: Biotronic Biomonitor II loop 07/28/2021 07/28/2021   Obesity    Stroke [facility])    june    PAST SURGICAL HISTORY: Past Surgical History:  Procedure Laterality Date   BIOPSY OF SKIN SUBCUTANEOUS TISSUE AND/OR MUCOUS MEMBRANE  11/21/2023   Procedure: BIOPSY, SKIN, SUBCUTANEOUS TISSUE, OR MUCOUS MEMBRANE;  Surgeon: Albertus Gordy HERO, MD;  Location: [facility] ENDOSCOPY;  Service: Gastroenterology;;   COLONOSCOPY W/ BIOPSIES AND POLYPECTOMY     CORONARY ARTERY BYPASS GRAFT  2008   VESSELS X6   ESOPHAGOGASTRODUODENOSCOPY N/A 11/20/2023   Procedure: EGD (ESOPHAGOGASTRODUODENOSCOPY);  Surgeon: Albertus Gordy HERO, MD;  Location: [facility] ENDOSCOPY;  Service: Gastroenterology;  Laterality: N/A;   ESOPHAGOGASTRODUODENOSCOPY N/A 11/21/2023   Procedure: EGD (ESOPHAGOGASTRODUODENOSCOPY);  Surgeon: Albertus Gordy HERO, MD;  Location: [facility] ENDOSCOPY;  Service: Gastroenterology;  Laterality: N/A;   IR CT HEAD LTD  10/02/2022   IR PERCUTANEOUS ART THROMBECTOMY/INFUSION INTRACRANIAL INC DIAG ANGIO  10/02/2022   IR US  GUIDE VASC ACCESS RIGHT  10/02/2022   LOOP RECORDER IMPLANT     RADIOLOGY WITH ANESTHESIA N/A 10/02/2022   Procedure: IR WITH ANESTHESIA;  Surgeon: Radiologist, Medication, MD;  Location: [facility] OR;  Service: Radiology;  Laterality: N/A;   UMBILICAL HERNIA REPAIR      FAMILY HISTORY: Family History  Problem Relation Age of Onset   Heart attack Mother    Clotting disorder Father        died of renal disease   Heart disease Sister    Cancer Sister    Colon cancer Neg Hx     SOCIAL  HISTORY: Social History   Socioeconomic History   Marital status: Widowed    Spouse name: Not on file   Number of children: Not on file   Years of education: Not on file   Highest education level: Not on file  Occupational History   Occupation: Field seismologist  Tobacco Use   Smoking status: Never   Smokeless tobacco: Never  Vaping Use   Vaping status: Never Used  Substance and Sexual Activity   Alcohol  use: No    Alcohol /week: 1.0 standard drink of alcohol     Types: 1 Glasses of wine  per week   Drug use: No   Sexual activity: Not on file  Other Topics Concern   Not on file  Social History Narrative   Retired Surveyor, minerals.  Widowed since 2014.  He has multiple children.  Never smoker 5 caffeinated beverages daily and no alcohol    Social Drivers of Corporate investment banker Strain: Not on file  Food Insecurity: No Food Insecurity (11/20/2023)   Hunger Vital Sign    Worried About Running Out of Food in the Last Year: Never true    Ran Out of Food in the Last Year: Never true  Transportation Needs: No Transportation Needs (11/20/2023)   PRAPARE - Administrator, Civil Service (Medical): No    Lack of Transportation (Non-Medical): No  Physical Activity: Not on file  Stress: Not on file  Social Connections: Moderately Isolated (11/20/2023)   Social Connection and Isolation Panel    Frequency of Communication with Friends and Family: Three times a week    Frequency of Social Gatherings with Friends and Family: Once a week    Attends Religious Services: 1 to 4 times per year    Active Member of [employer] or Organizations: No    Attends Banker Meetings: Never    Marital Status: Widowed  Intimate Partner Violence: Not At Risk (11/20/2023)   Humiliation, Afraid, Rape, and Kick questionnaire    Fear of Current or Ex-Partner: No    Emotionally Abused: No    Physically Abused: No    Sexually Abused: No       PHYSICAL EXAM  Vitals:   12/20/23 1533   BP: 125/75  Pulse: 69  SpO2: 97%  Weight: 158 lb (71.7 kg)  Height: 5' 9 (1.753 m)    Body mass index is 23.33 kg/m.   General: The patient is well-developed and well-nourished and in no acute distress  HEENT:  Head is [place]/AT.  Sclera are anicteric.  Funduscopic exam shows normal optic discs and retinal vessels.  Neck: No carotid bruits are noted.  The neck is nontender.  Cardiovascular: The heart has a regular rate and rhythm with a normal S1 and S2. There were no murmurs, gallops or rubs.    Skin: Extremities are without rash or  edema.  Musculoskeletal:  Back is nontender  Neurologic Exam  Mental status: The patient is alert and oriented x 3 at the time of the examination. The patient has apparent normal recent and remote memory, with an apparently normal attention span and concentration ability.   Speech is normal.  Cranial nerves: Extraocular movements are full. Pupils are 2 mm equal, round, and reactive to light and accomodation.    Facial symmetry is present. There is good facial sensation to soft touch bilaterally.Facial strength is normal.  Trapezius and sternocleidomastoid strength is normal. No dysarthria is noted.  The tongue is midline, and the patient has symmetric elevation of the soft palate. No obvious hearing deficits are noted.  Motor:  Muscle bulk is normal.   Tone is normal. Strength is  5 / 5 in all 4 extremities.   Sensory: Sensory testing is intact to pinprick, soft touch and vibration sensation in the arms.  However, vibration sensation was only about 20% at the knees and absent at the ankles.  Touch sensation was pretty normal at the ankles and just mildly reduced at the toes  Coordination: Cerebellar testing reveals good finger-nose-finger and poor heel-to-shin bilaterally.  Gait and station:  He needs bilateral support to  stand up once up, with unilateral support, he can take steps but has a reduced stride with shuffling.  He takes 8 steps to turn 180  degrees.  Romberg was positive   Reflexes: Deep tendon reflexes are symmetric and normal in arms, 2 at the knees and 1 at the ankles.   Plantar responses are flexor.    DIAGNOSTIC DATA (LABS, IMAGING, TESTING) - I reviewed patient records, labs, notes, testing and imaging myself where available.  Lab Results  Component Value Date   WBC 8.3 12/19/2023   HGB 13.5 12/19/2023   HCT 41.9 12/19/2023   MCV 102 (H) 12/19/2023   PLT 202 12/19/2023      Component Value Date/Time   NA 139 12/19/2023 1244   K 4.6 12/19/2023 1244   CL 101 12/19/2023 1244   CO2 23 12/19/2023 1244   GLUCOSE 212 (H) 12/19/2023 1244   GLUCOSE 128 (H) 11/22/2023 0616   BUN 19 12/19/2023 1244   CREATININE 1.34 (H) 12/19/2023 1244   CALCIUM  10.0 12/19/2023 1244   PROT 5.1 (L) 11/21/2023 0556   ALBUMIN 2.4 (L) 11/21/2023 0556   AST 23 11/21/2023 0556   ALT 19 11/21/2023 0556   ALKPHOS 58 11/21/2023 0556   BILITOT 1.0 11/21/2023 0556   GFRNONAA >60 11/22/2023 0616   GFRAA  10/11/2009 0852    >60        The eGFR has been calculated using the MDRD equation. This calculation has not been validated in all clinical situations. eGFR's persistently <60 mL/min signify possible Chronic Kidney Disease.   Lab Results  Component Value Date   CHOL 136 10/02/2022   HDL 45 10/02/2022   LDLCALC 77 10/02/2022   TRIG 68 10/02/2022   CHOLHDL 3.0 10/02/2022   Lab Results  Component Value Date   HGBA1C 7.1 (H) 11/21/2023   Lab Results  Component Value Date   VITAMINB12 298 11/21/2023   Lab Results  Component Value Date   TSH 1.169 11/21/2023       ASSESSMENT AND PLAN  Polyneuropathy - Plan: Multiple Myeloma Panel (SPEP&IFE w/QIG), Copper , serum, NCV with EMG(electromyography), CANCELED: Vitamin B12  Gait disturbance - Plan: MR CERVICAL SPINE WO CONTRAST  Urinary incontinence, unspecified type - Plan: MR CERVICAL SPINE WO CONTRAST  Cerebral ventriculomegaly  History of embolic stroke  Paroxysmal  atrial fibrillation (HCC)  Numbness - Plan: Multiple Myeloma Panel (SPEP&IFE w/QIG), Copper , serum, NCV with EMG(electromyography), CANCELED: Vitamin B12   In summary, Mr. Murch is a 89 year old man with progressive gait disturbance he also has numbness in his legs, bladder dysfunction and cognitive changes.  Symptoms seem to have worsened a year ago after a PCA stroke.  However, over the last 6 to 8 months they have progressively worsened.  Imaging studies show changes in the brain very consistent with normal pressure hydrocephalus.  Additionally he has chronic ischemic changes in the brainstem and cerebellum.  I am concerned about the significant numbness in his legs.  This would not be expected with normal pressure hydrocephalus.  Therefore, I have to also be concerned about a severe large fiber polyneuropathy and cervical spine stenosis/compression.  I discussed that he could have several different processes going on and it is not clear which one is contributing the most to his gait disturbance and falls.  To try to elucidate the primary cause of his symptoms we will check an MRI of the cervical spine and an NCV/EMG study.  Additionally I will check blood work for MGUS.  If  a definite cause of his gait issues is not identified, we will have him do a high-volume lumbar puncture to determine if this helps his symptoms.  If so, a shunt could also be considered.  I discussed this with the patient as well as his daughter who accompanied him today.  He has paroxysmal A-fib and seems to be in normal sinus rhythm today.  Due to his risk of falls and head injury, the anticoagulant was changed to an antiplatelet agent recently.  If gait improves he could return to Eliquis   They will return to see us  after the studies are done and contact us  sooner if new or worsening symptoms.  We will let them know the results of the studies as they are performed.  This visit is part of a comprehensive longitudinal care  medical relationship regarding the patients primary diagnosis of NPH and related concerns.    Heston Widener A. Vear, MD, [facility] 12/20/2023, 3:44 PM Certified in Neurology, Clinical Neurophysiology, Sleep Medicine and Neuroimaging  [facility] [address], [address] [place], [place] [address] [phone]

## 2023-12-18 NOTE — Progress Notes (Unsigned)
 Cardiology Office Note    Patient Name: [name] Date of Encounter: 12/19/2023  Primary Care Provider:  Loreli Elsie JONETTA Mickey., MD Primary Cardiologist:  Gordy Bergamo, MD Primary Electrophysiologist: None   Past Medical History    Past Medical History:  Diagnosis Date   Acute renal failure (HCC)    secondary to nephrotic syndrome post cath, resolved with steroids   Anxiety    Arthritis    CAD (coronary artery disease) 04/2006   LIMA to LAD, sequential SVG to first, second, and third OM's, sequential SVG to mid RCA and PDA    Depression    Diabetes mellitus without complication [facility])    ED (erectile dysfunction)    HTN (hypertension)    Hx of adenomatous colonic polyps    Hyperlipidemia    Loop recorder: Biotronic Biomonitor II loop 07/28/2021 07/28/2021   Obesity    Stroke (HCC)    june    History of Present Illness  [name] is a 89 y.o. 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  Mr. [name] was previously followed by Dr. Wonda and is currently followed by Dr. Bergamo for management of CAD and AF.  He underwent CABG in 2008 Loop recorder implanted in 07/2021 due to recurrent syncope.  He presented to the ED on 09/2022 with left facial droop and arm weakness and was diagnosed with embolic CVA due to arrhythmia that was treated with thrombectomy of the left P1 segment of the posterior cerebral artery. Patient upon admission was found to have atypical atrial flutter.  He was started on Eliquis .  He was last seen on 06/28/2023 for follow-up and was noted to have well-controlled BP with worsening lower extremity edema management of symptoms with Lasix .  He was seen in the ED on 09/05/2023 due to a fall was readmitted on 10/2023 due to weakness.  He was found to have sepsis secondary to UTI and treated with antibiotics.  He was  readmitted on 11/19/2023 due to fall and hematemesis.  Completed CT of the head that showed no cranial abnormalities and CT of the abdomen showed no evidence of GI bleed.  He completed a 2D echo that showed EF of 50-55% and decision was made to switch to Plavix  and discontinue Eliquis  due to history of several falls.  [name] presents today with his daughter for posthospital follow-up. He has a history of atrial fibrillation and was previously on Eliquis . No episodes of heart rate racing, chest pain, or shortness of breath have occurred since his hospitalization. A loop recorder is implanted to monitor atrial fibrillation episodes. The last upload was in June. He is unsure about how to perform transmissions at home but has a device available. He experienced two falls recently, one in August while attempting to go to bed without caregiver assistance, and another while sitting on the toilet and reaching for his shoes. These incidents have led to increased caregiver support at home, with caregivers present from 8:30 AM to 4:30 PM and 6 PM to 10 PM. He lives in an apartment with caregiver support and has occupational, physical, and speech therapy to assist with mobility and daily activities. He was hospitalized in July for weakness and diagnosed with sepsis due to a urinary tract infection, which was treated successfully. He has a history of leg swelling, previously treated with  Lasix , but reports no significant increase in swelling since returning home. He uses a chair that elevates his feet to help manage leg swelling. Patient denies chest pain, palpitations, dyspnea, PND, orthopnea, nausea, vomiting, dizziness, syncope, edema, weight gain, or early satiety.  Discussed the use of AI scribe software for clinical note transcription with the patient, who gave verbal consent to proceed.  History of Present Illness   Review of Systems  Please see the history of present illness.    All other systems reviewed  and are otherwise negative except as noted above.  Physical Exam    Wt Readings from Last 3 Encounters:  12/19/23 159 lb (72.1 kg)  11/21/23 171 lb (77.6 kg)  11/08/23 165 lb 4.8 oz (75 kg)   VS: Vitals:   12/19/23 1107  BP: 134/78  Pulse: 70  SpO2: 95%  ,Body mass index is 23.48 kg/m. GEN: Well nourished, well developed in no acute distress Neck: No JVD; No carotid bruits Pulmonary: Clear to auscultation without rales, wheezing or rhonchi  Cardiovascular: Normal rate. Regular rhythm. Normal S1. Normal S2.   Murmurs: There is no murmur.  ABDOMEN: Soft, non-tender, non-distended EXTREMITIES:  No edema; No deformity   EKG/LABS/ Recent Cardiac Studies   ECG personally reviewed by me today -none completed today  Risk Assessment/Calculations:    CHA2DS2-VASc Score = 8   This indicates a 10.8% annual risk of stroke. The patient's score is based upon: CHF History: 1 HTN History: 1 Diabetes History: 1 Stroke History: 2 Vascular Disease History: 1 Age Score: 2 Gender Score: 0         Lab Results  Component Value Date   WBC 8.1 11/22/2023   HGB 13.1 11/22/2023   HCT 38.8 (L) 11/22/2023   MCV 97.7 11/22/2023   PLT 244 11/22/2023   Lab Results  Component Value Date   CREATININE 1.17 11/22/2023   BUN 17 11/22/2023   NA 136 11/22/2023   K 3.7 11/22/2023   CL 100 11/22/2023   CO2 26 11/22/2023   Lab Results  Component Value Date   CHOL 136 10/02/2022   HDL 45 10/02/2022   LDLCALC 77 10/02/2022   TRIG 68 10/02/2022   CHOLHDL 3.0 10/02/2022    Lab Results  Component Value Date   HGBA1C 7.1 (H) 11/21/2023   Assessment & Plan   Assessment & Plan   1.  History of CAD: -s/p CABG in 2008 with no anginal symptoms reported - Continue current GDMT with Lipitor  40 mg, Plavix  75 mg, Zetia  10 mg  2.  Paroxysmal AF: -Patient is rate controlled today reports tachycardia or palpitations -He was previously on Eliquis  but discontinued due to history of falls and GI  bleed - Interrogate loop recorder monthly for atrial fibrillation frequency. - Continue Plavix  75 mg - Recheck BMET and CBC today  3.  History of CVA: - Patient no longer on Eliquis  due to leading risk and currently on Plavix  - Patient denies any new symptoms - continue Lipitor  40 mg daily  4.  History of recurrent falls: -Falls due to inadequate caregiver support and independent activities. Increased caregiver support but falls persist, indicating ongoing risk. - Ensure caregiver support during high-risk times, such as evening and night. - Encourage use of assistive devices to prevent falls.  5.  Essential hypertension: - Patient's blood pressure today was stable at 134/58 - Continue olmesartan  20 mg daily  6.  HFpEF: -Symptoms well-managed. Lasix  used cautiously due to fall risk from frequent urination.  Increased caregiver support and assistive devices to mitigate fall risk. - Monitor for heart failure symptoms such as dyspnea and edema. - Use Lasix  as needed for fluid overload, with caution due to fall risk.   Disposition: Follow-up with Gordy Bergamo, MD or APP in 6 months   Signed, Wyn Raddle, Jackee Shove, NP 12/19/2023, 12:37 PM [facility]

## 2023-12-19 ENCOUNTER — Other Ambulatory Visit (HOSPITAL_COMMUNITY): Payer: Self-pay

## 2023-12-19 ENCOUNTER — Encounter: Payer: Self-pay | Admitting: Nurse Practitioner

## 2023-12-19 ENCOUNTER — Ambulatory Visit: Attending: Nurse Practitioner | Admitting: Nurse Practitioner

## 2023-12-19 VITALS — BP 134/78 | HR 70 | Ht 69.0 in | Wt 159.0 lb

## 2023-12-19 DIAGNOSIS — R4185 Anosognosia: Secondary | ICD-10-CM | POA: Diagnosis not present

## 2023-12-19 DIAGNOSIS — I2510 Atherosclerotic heart disease of native coronary artery without angina pectoris: Secondary | ICD-10-CM

## 2023-12-19 DIAGNOSIS — I10 Essential (primary) hypertension: Secondary | ICD-10-CM | POA: Diagnosis not present

## 2023-12-19 DIAGNOSIS — R41841 Cognitive communication deficit: Secondary | ICD-10-CM | POA: Diagnosis not present

## 2023-12-19 DIAGNOSIS — W19XXXA Unspecified fall, initial encounter: Secondary | ICD-10-CM

## 2023-12-19 DIAGNOSIS — R2681 Unsteadiness on feet: Secondary | ICD-10-CM | POA: Diagnosis not present

## 2023-12-19 DIAGNOSIS — I480 Paroxysmal atrial fibrillation: Secondary | ICD-10-CM | POA: Diagnosis not present

## 2023-12-19 DIAGNOSIS — Y92009 Unspecified place in unspecified non-institutional (private) residence as the place of occurrence of the external cause: Secondary | ICD-10-CM | POA: Diagnosis not present

## 2023-12-19 DIAGNOSIS — I5032 Chronic diastolic (congestive) heart failure: Secondary | ICD-10-CM

## 2023-12-19 DIAGNOSIS — I63443 Cerebral infarction due to embolism of bilateral cerebellar arteries: Secondary | ICD-10-CM | POA: Diagnosis not present

## 2023-12-19 DIAGNOSIS — R278 Other lack of coordination: Secondary | ICD-10-CM | POA: Diagnosis not present

## 2023-12-19 DIAGNOSIS — R488 Other symbolic dysfunctions: Secondary | ICD-10-CM | POA: Diagnosis not present

## 2023-12-19 DIAGNOSIS — R296 Repeated falls: Secondary | ICD-10-CM | POA: Diagnosis not present

## 2023-12-19 DIAGNOSIS — R4789 Other speech disturbances: Secondary | ICD-10-CM | POA: Diagnosis not present

## 2023-12-19 DIAGNOSIS — M62561 Muscle wasting and atrophy, not elsewhere classified, right lower leg: Secondary | ICD-10-CM | POA: Diagnosis not present

## 2023-12-19 NOTE — Patient Instructions (Addendum)
 Medication Instructions:  Your physician recommends that you continue on your current medications as directed. Please refer to the Current Medication list given to you today. *If you need a refill on your cardiac medications before your next appointment, please call your pharmacy*  Lab Work: TODAY-BMET & CBC If you have labs (blood work) drawn today and your tests are completely normal, you will receive your results only by: MyChart Message (if you have MyChart) OR A paper copy in the mail If you have any lab test that is abnormal or we need to change your treatment, we will call you to review the results.  Testing/Procedures: NONE ORDERED  Follow-Up: At [facility], you and your health needs are our priority.  As part of our continuing mission to provide you with exceptional heart care, our providers are all part of one team.  This team includes your primary Cardiologist (physician) and Advanced Practice Providers or APPs (Physician Assistants and Nurse Practitioners) who all work together to provide you with the care you need, when you need it.  Your next appointment:   6 month(s)  Provider:   Gordy Bergamo, MD    We recommend signing up for the patient portal called MyChart.  Sign up information is provided on this After Visit Summary.  MyChart is used to connect with patients for Virtual Visits (Telemedicine).  Patients are able to view lab/test results, encounter notes, upcoming appointments, etc.  Non-urgent messages can be sent to your provider as well.   To learn more about what you can do with MyChart, go to [email or web address].   Other Instructions FYI TRANSMITTER

## 2023-12-20 ENCOUNTER — Ambulatory Visit: Payer: Self-pay | Admitting: Nurse Practitioner

## 2023-12-20 ENCOUNTER — Telehealth: Payer: Self-pay | Admitting: Neurology

## 2023-12-20 ENCOUNTER — Encounter: Payer: Self-pay | Admitting: Neurology

## 2023-12-20 ENCOUNTER — Ambulatory Visit: Admitting: Neurology

## 2023-12-20 VITALS — BP 125/75 | HR 69 | Ht 69.0 in | Wt 158.0 lb

## 2023-12-20 DIAGNOSIS — R278 Other lack of coordination: Secondary | ICD-10-CM | POA: Diagnosis not present

## 2023-12-20 DIAGNOSIS — R488 Other symbolic dysfunctions: Secondary | ICD-10-CM | POA: Diagnosis not present

## 2023-12-20 DIAGNOSIS — R269 Unspecified abnormalities of gait and mobility: Secondary | ICD-10-CM | POA: Diagnosis not present

## 2023-12-20 DIAGNOSIS — R2681 Unsteadiness on feet: Secondary | ICD-10-CM | POA: Diagnosis not present

## 2023-12-20 DIAGNOSIS — R4789 Other speech disturbances: Secondary | ICD-10-CM | POA: Diagnosis not present

## 2023-12-20 DIAGNOSIS — R296 Repeated falls: Secondary | ICD-10-CM | POA: Diagnosis not present

## 2023-12-20 DIAGNOSIS — D472 Monoclonal gammopathy: Secondary | ICD-10-CM | POA: Diagnosis not present

## 2023-12-20 DIAGNOSIS — R4185 Anosognosia: Secondary | ICD-10-CM | POA: Diagnosis not present

## 2023-12-20 DIAGNOSIS — G9389 Other specified disorders of brain: Secondary | ICD-10-CM

## 2023-12-20 DIAGNOSIS — R32 Unspecified urinary incontinence: Secondary | ICD-10-CM | POA: Diagnosis not present

## 2023-12-20 DIAGNOSIS — I480 Paroxysmal atrial fibrillation: Secondary | ICD-10-CM

## 2023-12-20 DIAGNOSIS — M62561 Muscle wasting and atrophy, not elsewhere classified, right lower leg: Secondary | ICD-10-CM | POA: Diagnosis not present

## 2023-12-20 DIAGNOSIS — G629 Polyneuropathy, unspecified: Secondary | ICD-10-CM

## 2023-12-20 DIAGNOSIS — Z8673 Personal history of transient ischemic attack (TIA), and cerebral infarction without residual deficits: Secondary | ICD-10-CM

## 2023-12-20 DIAGNOSIS — R200 Anesthesia of skin: Secondary | ICD-10-CM | POA: Diagnosis not present

## 2023-12-20 DIAGNOSIS — R41841 Cognitive communication deficit: Secondary | ICD-10-CM | POA: Diagnosis not present

## 2023-12-20 LAB — BASIC METABOLIC PANEL WITH GFR
BUN/Creatinine Ratio: 14 (ref 10–24)
BUN: 19 mg/dL (ref 8–27)
CO2: 23 mmol/L (ref 20–29)
Calcium: 10 mg/dL (ref 8.6–10.2)
Chloride: 101 mmol/L (ref 96–106)
Creatinine, Ser: 1.34 mg/dL — AB (ref 0.76–1.27)
Glucose: 212 mg/dL — AB (ref 70–99)
Potassium: 4.6 mmol/L (ref 3.5–5.2)
Sodium: 139 mmol/L (ref 134–144)
eGFR: 51 mL/min/1.73 — AB (ref >59)

## 2023-12-20 LAB — CBC
Hematocrit: 41.9 % (ref 37.5–51.0)
Hemoglobin: 13.5 g/dL (ref 13.0–17.7)
MCH: 32.8 pg (ref 26.6–33.0)
MCHC: 32.2 g/dL (ref 31.5–35.7)
MCV: 102 fL — ABNORMAL HIGH (ref 79–97)
Platelets: 202 x10E3/uL (ref 150–450)
RBC: 4.12 x10E6/uL — ABNORMAL LOW (ref 4.14–5.80)
RDW: 13 % (ref 11.6–15.4)
WBC: 8.3 x10E3/uL (ref 3.4–10.8)

## 2023-12-20 NOTE — Telephone Encounter (Signed)
 no auth required sent to [facility] [phone]

## 2023-12-21 DIAGNOSIS — R296 Repeated falls: Secondary | ICD-10-CM | POA: Diagnosis not present

## 2023-12-21 DIAGNOSIS — R278 Other lack of coordination: Secondary | ICD-10-CM | POA: Diagnosis not present

## 2023-12-21 DIAGNOSIS — R4185 Anosognosia: Secondary | ICD-10-CM | POA: Diagnosis not present

## 2023-12-21 DIAGNOSIS — R488 Other symbolic dysfunctions: Secondary | ICD-10-CM | POA: Diagnosis not present

## 2023-12-21 DIAGNOSIS — R2681 Unsteadiness on feet: Secondary | ICD-10-CM | POA: Diagnosis not present

## 2023-12-21 DIAGNOSIS — R41841 Cognitive communication deficit: Secondary | ICD-10-CM | POA: Diagnosis not present

## 2023-12-21 DIAGNOSIS — M62561 Muscle wasting and atrophy, not elsewhere classified, right lower leg: Secondary | ICD-10-CM | POA: Diagnosis not present

## 2023-12-21 DIAGNOSIS — R4789 Other speech disturbances: Secondary | ICD-10-CM | POA: Diagnosis not present

## 2023-12-22 ENCOUNTER — Telehealth: Payer: Self-pay | Admitting: Neurology

## 2023-12-22 DIAGNOSIS — R488 Other symbolic dysfunctions: Secondary | ICD-10-CM | POA: Diagnosis not present

## 2023-12-22 DIAGNOSIS — R4789 Other speech disturbances: Secondary | ICD-10-CM | POA: Diagnosis not present

## 2023-12-22 DIAGNOSIS — R278 Other lack of coordination: Secondary | ICD-10-CM | POA: Diagnosis not present

## 2023-12-22 DIAGNOSIS — R296 Repeated falls: Secondary | ICD-10-CM | POA: Diagnosis not present

## 2023-12-22 DIAGNOSIS — R41841 Cognitive communication deficit: Secondary | ICD-10-CM | POA: Diagnosis not present

## 2023-12-22 DIAGNOSIS — M62561 Muscle wasting and atrophy, not elsewhere classified, right lower leg: Secondary | ICD-10-CM | POA: Diagnosis not present

## 2023-12-22 DIAGNOSIS — R4185 Anosognosia: Secondary | ICD-10-CM | POA: Diagnosis not present

## 2023-12-22 DIAGNOSIS — R2681 Unsteadiness on feet: Secondary | ICD-10-CM | POA: Diagnosis not present

## 2023-12-22 NOTE — Telephone Encounter (Signed)
 Pt's daughter would like for Dr Vear to go ahead and order the Spinal tap for pt as soon as possible, please call her to discuss.

## 2023-12-24 DIAGNOSIS — R296 Repeated falls: Secondary | ICD-10-CM | POA: Diagnosis not present

## 2023-12-24 DIAGNOSIS — R278 Other lack of coordination: Secondary | ICD-10-CM | POA: Diagnosis not present

## 2023-12-24 DIAGNOSIS — R488 Other symbolic dysfunctions: Secondary | ICD-10-CM | POA: Diagnosis not present

## 2023-12-24 DIAGNOSIS — R41841 Cognitive communication deficit: Secondary | ICD-10-CM | POA: Diagnosis not present

## 2023-12-24 DIAGNOSIS — R2681 Unsteadiness on feet: Secondary | ICD-10-CM | POA: Diagnosis not present

## 2023-12-24 DIAGNOSIS — M62561 Muscle wasting and atrophy, not elsewhere classified, right lower leg: Secondary | ICD-10-CM | POA: Diagnosis not present

## 2023-12-24 DIAGNOSIS — R4789 Other speech disturbances: Secondary | ICD-10-CM | POA: Diagnosis not present

## 2023-12-24 DIAGNOSIS — R4185 Anosognosia: Secondary | ICD-10-CM | POA: Diagnosis not present

## 2023-12-25 ENCOUNTER — Other Ambulatory Visit: Payer: Self-pay | Admitting: Neurology

## 2023-12-25 ENCOUNTER — Ambulatory Visit: Payer: Self-pay | Admitting: Neurology

## 2023-12-25 DIAGNOSIS — R269 Unspecified abnormalities of gait and mobility: Secondary | ICD-10-CM

## 2023-12-25 DIAGNOSIS — M62561 Muscle wasting and atrophy, not elsewhere classified, right lower leg: Secondary | ICD-10-CM | POA: Diagnosis not present

## 2023-12-25 DIAGNOSIS — G9389 Other specified disorders of brain: Secondary | ICD-10-CM

## 2023-12-25 DIAGNOSIS — R296 Repeated falls: Secondary | ICD-10-CM | POA: Diagnosis not present

## 2023-12-25 DIAGNOSIS — R4185 Anosognosia: Secondary | ICD-10-CM | POA: Diagnosis not present

## 2023-12-25 DIAGNOSIS — G629 Polyneuropathy, unspecified: Secondary | ICD-10-CM

## 2023-12-25 DIAGNOSIS — G912 (Idiopathic) normal pressure hydrocephalus: Secondary | ICD-10-CM

## 2023-12-25 DIAGNOSIS — R4789 Other speech disturbances: Secondary | ICD-10-CM | POA: Diagnosis not present

## 2023-12-25 DIAGNOSIS — R488 Other symbolic dysfunctions: Secondary | ICD-10-CM | POA: Diagnosis not present

## 2023-12-25 DIAGNOSIS — R41841 Cognitive communication deficit: Secondary | ICD-10-CM | POA: Diagnosis not present

## 2023-12-25 DIAGNOSIS — R278 Other lack of coordination: Secondary | ICD-10-CM | POA: Diagnosis not present

## 2023-12-25 DIAGNOSIS — D472 Monoclonal gammopathy: Secondary | ICD-10-CM

## 2023-12-25 DIAGNOSIS — R2681 Unsteadiness on feet: Secondary | ICD-10-CM | POA: Diagnosis not present

## 2023-12-25 LAB — MULTIPLE MYELOMA PANEL, SERUM
Albumin SerPl Elph-Mcnc: 3.5 g/dL (ref 2.9–4.4)
Albumin/Glob SerPl: 1.1 (ref 0.7–1.7)
Alpha 1: 0.2 g/dL (ref 0.0–0.4)
Alpha2 Glob SerPl Elph-Mcnc: 0.8 g/dL (ref 0.4–1.0)
B-Globulin SerPl Elph-Mcnc: 1 g/dL (ref 0.7–1.3)
Gamma Glob SerPl Elph-Mcnc: 1.3 g/dL (ref 0.4–1.8)
Globulin, Total: 3.4 g/dL (ref 2.2–3.9)
IgA/Immunoglobulin A, Serum: 408 mg/dL (ref 61–437)
IgG (Immunoglobin G), Serum: 1256 mg/dL (ref 603–1613)
IgM (Immunoglobulin M), Srm: 303 mg/dL — ABNORMAL HIGH (ref 15–143)
M Protein SerPl Elph-Mcnc: 0.3 g/dL — ABNORMAL HIGH
Total Protein: 6.9 g/dL (ref 6.0–8.5)

## 2023-12-25 LAB — COPPER, SERUM: Copper: 103 ug/dL (ref 69–132)

## 2023-12-25 NOTE — Telephone Encounter (Signed)
 Called daughter/[name] at [phone]. Relayed Dr. Vear ordered LP and placed her number on order for them to call her to schedule. She verbalized understanding and appreciation.

## 2023-12-25 NOTE — Telephone Encounter (Signed)
 Dr. Vear- just wanting to confirm with you after reviewing notes. Are you checking MRI and EMG/NCS first prior to doing LP?

## 2023-12-26 ENCOUNTER — Telehealth: Payer: Self-pay | Admitting: Cardiology

## 2023-12-26 DIAGNOSIS — R4789 Other speech disturbances: Secondary | ICD-10-CM | POA: Diagnosis not present

## 2023-12-26 DIAGNOSIS — R488 Other symbolic dysfunctions: Secondary | ICD-10-CM | POA: Diagnosis not present

## 2023-12-26 DIAGNOSIS — R278 Other lack of coordination: Secondary | ICD-10-CM | POA: Diagnosis not present

## 2023-12-26 DIAGNOSIS — M62561 Muscle wasting and atrophy, not elsewhere classified, right lower leg: Secondary | ICD-10-CM | POA: Diagnosis not present

## 2023-12-26 DIAGNOSIS — R41841 Cognitive communication deficit: Secondary | ICD-10-CM | POA: Diagnosis not present

## 2023-12-26 DIAGNOSIS — R4185 Anosognosia: Secondary | ICD-10-CM | POA: Diagnosis not present

## 2023-12-26 DIAGNOSIS — R2681 Unsteadiness on feet: Secondary | ICD-10-CM | POA: Diagnosis not present

## 2023-12-26 DIAGNOSIS — R296 Repeated falls: Secondary | ICD-10-CM | POA: Diagnosis not present

## 2023-12-26 NOTE — Telephone Encounter (Signed)
 Spoke with pt's daughter per [facility] regarding his loop recorder. Daughter stated he has misplaced the monitor. Daughter was told her message would be forwarded to our device team for assistance. Daughter verbalized understanding. All questions if any were answered.

## 2023-12-26 NOTE — Telephone Encounter (Signed)
 Daughter Kathye) stated patient moved and has lost his loop recorder device and wants to get a replacement.

## 2023-12-26 NOTE — Telephone Encounter (Signed)
 Spoke with daughter and let her know I have ordered pt a new monitor and it will be sent to her home. The monitor will arrive 7-10 business days and once the pt plugs it in it should start transmitting on its own in about 72 hours

## 2023-12-27 DIAGNOSIS — R278 Other lack of coordination: Secondary | ICD-10-CM | POA: Diagnosis not present

## 2023-12-27 DIAGNOSIS — R4185 Anosognosia: Secondary | ICD-10-CM | POA: Diagnosis not present

## 2023-12-27 DIAGNOSIS — R296 Repeated falls: Secondary | ICD-10-CM | POA: Diagnosis not present

## 2023-12-27 DIAGNOSIS — R4789 Other speech disturbances: Secondary | ICD-10-CM | POA: Diagnosis not present

## 2023-12-27 DIAGNOSIS — R41841 Cognitive communication deficit: Secondary | ICD-10-CM | POA: Diagnosis not present

## 2023-12-27 DIAGNOSIS — R488 Other symbolic dysfunctions: Secondary | ICD-10-CM | POA: Diagnosis not present

## 2023-12-27 DIAGNOSIS — R2681 Unsteadiness on feet: Secondary | ICD-10-CM | POA: Diagnosis not present

## 2023-12-27 DIAGNOSIS — M62561 Muscle wasting and atrophy, not elsewhere classified, right lower leg: Secondary | ICD-10-CM | POA: Diagnosis not present

## 2023-12-27 LAB — MAG IGM ANTIBODIES: MAG IgM Antibodies: <900 BTU (ref 0–999)

## 2023-12-27 LAB — MAG INTERPRETATION REFLEXED

## 2023-12-27 LAB — SPECIMEN STATUS REPORT

## 2023-12-28 ENCOUNTER — Emergency Department (HOSPITAL_COMMUNITY)

## 2023-12-28 ENCOUNTER — Other Ambulatory Visit: Payer: Self-pay

## 2023-12-28 ENCOUNTER — Emergency Department (HOSPITAL_COMMUNITY)
Admission: EM | Admit: 2023-12-28 | Discharge: 2023-12-28 | Disposition: A | Discharge status: Home / Self Care | Attending: Emergency Medicine | Admitting: Emergency Medicine

## 2023-12-28 DIAGNOSIS — Z7901 Long term (current) use of anticoagulants: Secondary | ICD-10-CM | POA: Insufficient documentation

## 2023-12-28 DIAGNOSIS — N189 Chronic kidney disease, unspecified: Secondary | ICD-10-CM | POA: Diagnosis not present

## 2023-12-28 DIAGNOSIS — Z9889 Other specified postprocedural states: Secondary | ICD-10-CM | POA: Diagnosis not present

## 2023-12-28 DIAGNOSIS — R296 Repeated falls: Secondary | ICD-10-CM | POA: Diagnosis not present

## 2023-12-28 DIAGNOSIS — R4789 Other speech disturbances: Secondary | ICD-10-CM | POA: Diagnosis not present

## 2023-12-28 DIAGNOSIS — G9389 Other specified disorders of brain: Secondary | ICD-10-CM | POA: Diagnosis not present

## 2023-12-28 DIAGNOSIS — W19XXXA Unspecified fall, initial encounter: Secondary | ICD-10-CM

## 2023-12-28 DIAGNOSIS — W01198A Fall on same level from slipping, tripping and stumbling with subsequent striking against other object, initial encounter: Secondary | ICD-10-CM | POA: Diagnosis not present

## 2023-12-28 DIAGNOSIS — M50323 Other cervical disc degeneration at C6-C7 level: Secondary | ICD-10-CM | POA: Diagnosis not present

## 2023-12-28 DIAGNOSIS — I6529 Occlusion and stenosis of unspecified carotid artery: Secondary | ICD-10-CM | POA: Diagnosis not present

## 2023-12-28 DIAGNOSIS — R41841 Cognitive communication deficit: Secondary | ICD-10-CM | POA: Diagnosis not present

## 2023-12-28 DIAGNOSIS — S51012A Laceration without foreign body of left elbow, initial encounter: Secondary | ICD-10-CM | POA: Diagnosis not present

## 2023-12-28 DIAGNOSIS — R58 Hemorrhage, not elsewhere classified: Secondary | ICD-10-CM | POA: Diagnosis not present

## 2023-12-28 DIAGNOSIS — S0101XA Laceration without foreign body of scalp, initial encounter: Secondary | ICD-10-CM | POA: Diagnosis not present

## 2023-12-28 DIAGNOSIS — R4185 Anosognosia: Secondary | ICD-10-CM | POA: Diagnosis not present

## 2023-12-28 DIAGNOSIS — I129 Hypertensive chronic kidney disease with stage 1 through stage 4 chronic kidney disease, or unspecified chronic kidney disease: Secondary | ICD-10-CM | POA: Insufficient documentation

## 2023-12-28 DIAGNOSIS — R2681 Unsteadiness on feet: Secondary | ICD-10-CM | POA: Diagnosis not present

## 2023-12-28 DIAGNOSIS — I482 Chronic atrial fibrillation, unspecified: Secondary | ICD-10-CM | POA: Insufficient documentation

## 2023-12-28 DIAGNOSIS — M2469 Ankylosis, other specified joint: Secondary | ICD-10-CM | POA: Diagnosis not present

## 2023-12-28 DIAGNOSIS — S51011A Laceration without foreign body of right elbow, initial encounter: Secondary | ICD-10-CM | POA: Diagnosis not present

## 2023-12-28 DIAGNOSIS — S0003XA Contusion of scalp, initial encounter: Secondary | ICD-10-CM | POA: Diagnosis not present

## 2023-12-28 DIAGNOSIS — M62561 Muscle wasting and atrophy, not elsewhere classified, right lower leg: Secondary | ICD-10-CM | POA: Diagnosis not present

## 2023-12-28 DIAGNOSIS — I10 Essential (primary) hypertension: Secondary | ICD-10-CM | POA: Diagnosis not present

## 2023-12-28 DIAGNOSIS — S0990XA Unspecified injury of head, initial encounter: Secondary | ICD-10-CM | POA: Diagnosis not present

## 2023-12-28 DIAGNOSIS — R220 Localized swelling, mass and lump, head: Secondary | ICD-10-CM | POA: Diagnosis not present

## 2023-12-28 DIAGNOSIS — R488 Other symbolic dysfunctions: Secondary | ICD-10-CM | POA: Diagnosis not present

## 2023-12-28 DIAGNOSIS — R278 Other lack of coordination: Secondary | ICD-10-CM | POA: Diagnosis not present

## 2023-12-28 LAB — CBC WITH DIFFERENTIAL/PLATELET
Abs Immature Granulocytes: 0.03 K/uL (ref 0.00–0.07)
Basophils Absolute: 0 K/uL (ref 0.0–0.1)
Basophils Relative: 0 %
Eosinophils Absolute: 0.3 K/uL (ref 0.0–0.5)
Eosinophils Relative: 4 %
HCT: 38.5 % — ABNORMAL LOW (ref 39.0–52.0)
Hemoglobin: 12.7 g/dL — ABNORMAL LOW (ref 13.0–17.0)
Immature Granulocytes: 0 %
Lymphocytes Relative: 25 %
Lymphs Abs: 1.7 K/uL (ref 0.7–4.0)
MCH: 32.4 pg (ref 26.0–34.0)
MCHC: 33 g/dL (ref 30.0–36.0)
MCV: 98.2 fL (ref 80.0–100.0)
Monocytes Absolute: 0.7 K/uL (ref 0.1–1.0)
Monocytes Relative: 10 %
Neutro Abs: 4.2 K/uL (ref 1.7–7.7)
Neutrophils Relative %: 61 %
Platelets: 201 K/uL (ref 150–400)
RBC: 3.92 MIL/uL — ABNORMAL LOW (ref 4.22–5.81)
RDW: 14.2 % (ref 11.5–15.5)
WBC: 7 K/uL (ref 4.0–10.5)
nRBC: 0 % (ref 0.0–0.2)

## 2023-12-28 LAB — COMPREHENSIVE METABOLIC PANEL WITH GFR
ALT: 16 U/L (ref 0–44)
AST: 24 U/L (ref 15–41)
Albumin: 3 g/dL — ABNORMAL LOW (ref 3.5–5.0)
Alkaline Phosphatase: 70 U/L (ref 38–126)
Anion gap: 9 (ref 5–15)
BUN: 18 mg/dL (ref 8–23)
CO2: 27 mmol/L (ref 22–32)
Calcium: 9.5 mg/dL (ref 8.9–10.3)
Chloride: 101 mmol/L (ref 98–111)
Creatinine, Ser: 1.21 mg/dL (ref 0.61–1.24)
GFR, Estimated: 58 mL/min — ABNORMAL LOW (ref >60)
Glucose, Bld: 122 mg/dL — ABNORMAL HIGH (ref 70–99)
Potassium: 3.9 mmol/L (ref 3.5–5.1)
Sodium: 137 mmol/L (ref 135–145)
Total Bilirubin: 0.6 mg/dL (ref 0.0–1.2)
Total Protein: 6.3 g/dL — ABNORMAL LOW (ref 6.5–8.1)

## 2023-12-28 LAB — URINALYSIS, W/ REFLEX TO CULTURE (INFECTION SUSPECTED)
Bacteria, UA: NONE SEEN
Bilirubin Urine: NEGATIVE
Glucose, UA: >=500 mg/dL — AB
Hgb urine dipstick: NEGATIVE
Ketones, ur: NEGATIVE mg/dL
Leukocytes,Ua: NEGATIVE
Nitrite: NEGATIVE
Protein, ur: 30 mg/dL — AB
Specific Gravity, Urine: 1.008 (ref 1.005–1.030)
pH: 7 (ref 5.0–8.0)

## 2023-12-28 NOTE — Progress Notes (Signed)
..  Trauma Response Nurse Documentation   [name] is a 89 y.o. male arriving to [facility] ED via EMS  On clopidogrel  75 mg daily. Trauma was activated as a Level 2 by charge nurse based on the following trauma criteria Elderly patients > 65 with head trauma on anti-coagulation (excluding ASA).  Patient cleared for CT by Dr. Midge.  Pt to CT with TRN remained with the patient throughout their absence from the department for clinical observation.   GCS 14.  Trauma MD Arrival Time: N/A.  History   Past Medical History:  Diagnosis Date   Acute renal failure (HCC)    secondary to nephrotic syndrome post cath, resolved with steroids   Anxiety    Arthritis    CAD (coronary artery disease) 04/2006   LIMA to LAD, sequential SVG to first, second, and third OM's, sequential SVG to mid RCA and PDA    Depression    Diabetes mellitus without complication [facility])    ED (erectile dysfunction)    HTN (hypertension)    Hx of adenomatous colonic polyps    Hyperlipidemia    Loop recorder: Biotronic Biomonitor II loop 07/28/2021 07/28/2021   Obesity    Stroke (HCC)    june     Past Surgical History:  Procedure Laterality Date   BIOPSY OF SKIN SUBCUTANEOUS TISSUE AND/OR MUCOUS MEMBRANE  11/21/2023   Procedure: BIOPSY, SKIN, SUBCUTANEOUS TISSUE, OR MUCOUS MEMBRANE;  Surgeon: Albertus Gordy HERO, MD;  Location: [facility] ENDOSCOPY;  Service: Gastroenterology;;   COLONOSCOPY W/ BIOPSIES AND POLYPECTOMY     CORONARY ARTERY BYPASS GRAFT  2008   VESSELS X6   ESOPHAGOGASTRODUODENOSCOPY N/A 11/20/2023   Procedure: EGD (ESOPHAGOGASTRODUODENOSCOPY);  Surgeon: Albertus Gordy HERO, MD;  Location[facility] ENDOSCOPY;  Service: Gastroenterology;  Laterality: N/A;   ESOPHAGOGASTRODUODENOSCOPY N/A 11/21/2023   Procedure: EGD (ESOPHAGOGASTRODUODENOSCOPY);  Surgeon: Albertus Gordy HERO, MD;  Location: [facility] ENDOSCOPY;  Service: Gastroenterology;  Laterality: N/A;   IR CT HEAD LTD  10/02/2022   IR PERCUTANEOUS ART THROMBECTOMY/INFUSION  INTRACRANIAL INC DIAG ANGIO  10/02/2022   IR US  GUIDE VASC ACCESS RIGHT  10/02/2022   LOOP RECORDER IMPLANT     RADIOLOGY WITH ANESTHESIA N/A 10/02/2022   Procedure: IR WITH ANESTHESIA;  Surgeon: Radiologist, Medication, MD;  Location: [facility] OR;  Service: Radiology;  Laterality: N/A;   UMBILICAL HERNIA REPAIR         Initial Focused Assessment (If applicable, or please see trauma documentation):   CT's Completed:   CT Head and CT Maxillofacial   Interventions:   Plan for disposition:  Other   Consults completed:  none.  Event Summary: Pt transported by [facility] from [facility] after fall in room. Pt noted to have WC and walker in his room, pt reports he was using neither when fall occurred. Significant medical hx with cognitive impairments at baseline. GCS 14, moving all extremities, pupils equal. IV est by EMS. Skin tears noted to bilat elbows, wound care completed using Xeroform, telfa and guaze. Labs drawn, C[place] in place by EMS. Pt does not recall hitting his head. Pt Transported with TRN on CCM to CT, no adverse events noted.  Unkn disposition at this time.     MTP Summary (If applicable): N/A  Bedside handoff with ED RN   [name], [name]  Trauma Response RN  Please call TRN at [phone] for further assistance.

## 2023-12-28 NOTE — ED Provider Notes (Signed)
 [facility] AT [facility] Provider Note   CSN: [id number] Arrival date & time: 12/28/23  9647     Patient presents with: Fall (Fall on blood thinners)   Future R Fess is a 89 y.o. male.   The history is provided by the patient and medical records.    21 old male with history of gait instability, paroxysmal A-fib, hyperlipidemia, hypertension, anxiety, CKD, presenting to the ED after unwitnessed fall.  Found on the floor in his room, presumably trying to get to the bathroom.  He did have an episode of incontinence in the floor.  Appears he struck his head against his wheelchair.  States generally he ambulates with a walker, was not using this at time of fall.  Has skin tears to bilateral elbows and wound to back of scalp.  Denies loss of consciousness.  Tetanus is up-to-date.  He is on ASA and plavix .  Prior to Admission medications   Medication Sig Start Date End Date Taking? Authorizing Provider  acebutolol  (SECTRAL ) 200 MG capsule Take 1 capsule (200 mg total) by mouth 2 (two) times daily. Patient not taking: Reported on 12/20/2023 02/05/23   Ladona Heinz, MD  atorvastatin  (LIPITOR ) 40 MG tablet Take 40 mg by mouth every evening. Patient not taking: Reported on 12/20/2023    [provider]  buPROPion  (WELLBUTRIN  XL) 150 MG 24 hr tablet Take 150 mg by mouth every morning.    [provider]  clopidogrel  (PLAVIX ) 75 MG tablet Take 1 tablet (75 mg total) by mouth daily. 11/22/23 11/21/24  Raenelle Donalda HERO, MD  cyanocobalamin  (VITAMIN B12) 1000 MCG tablet Take 1 tablet (1,000 mcg total) by mouth daily. Patient not taking: Reported on 12/20/2023 11/22/23   Raenelle Donalda HERO, MD  empagliflozin  (JARDIANCE ) 25 MG TABS tablet Take 1 tablet (25 mg total) by mouth daily. Patient not taking: Reported on 12/20/2023 10/12/22   Angiulli, Toribio PARAS, PA-C  escitalopram  (LEXAPRO ) 10 MG tablet Take 10 mg by mouth daily. 11/21/22   [provider]  ezetimibe   (ZETIA ) 10 MG tablet Take 10 mg by mouth daily. 11/21/22   [provider]  furosemide  (LASIX ) 40 MG tablet Take 1 tablet (40 mg total) by mouth daily as needed for edema. Patient not taking: Reported on 12/20/2023 12/28/22   Ladona Heinz, MD  insulin  degludec (TRESIBA FLEXTOUCH) 200 UNIT/ML FlexTouch Pen Inject 20 Units into the skin as needed (if BS is above 200).    [provider]  metFORMIN  (GLUCOPHAGE ) 1000 MG tablet Take 1,000 mg by mouth 2 (two) times daily with a meal. Patient not taking: Reported on 12/20/2023    [provider]  mirabegron  ER (MYRBETRIQ ) 50 MG TB24 tablet Take 1 tablet (50 mg total) by mouth daily. 10/12/22   Angiulli, Toribio PARAS, PA-C  olmesartan  (BENICAR ) 20 MG tablet Take 1 tablet (20 mg total) by mouth daily. 10/12/22   Angiulli, Toribio PARAS, PA-C  pantoprazole  (PROTONIX ) 40 MG tablet Take 1 tablet (40 mg total) by mouth daily. Patient not taking: Reported on 12/20/2023 11/22/23   Raenelle Donalda HERO, MD  polyethylene glycol powder (GLYCOLAX /MIRALAX ) 17 GM/SCOOP powder Take 17 g by mouth daily. 11/22/23   Ghimire, Donalda HERO, MD  senna (SENOKOT) 8.6 MG TABS tablet Take 1 tablet (8.6 mg total) by mouth at bedtime. 11/22/23   Ghimire, Donalda HERO, MD  tadalafil  (CIALIS ) 5 MG tablet Take 1 tablet (5 mg total) by mouth daily. 10/12/22   Angiulli, Toribio PARAS, PA-C  tamsulosin  (FLOMAX ) 0.4 MG CAPS capsule Take 1 capsule (0.4 mg total) by mouth daily after supper. Patient taking differently: Take 0.4 mg by mouth at bedtime. 10/12/22   Angiulli, Toribio PARAS, PA-C    Allergies: Ketorolac tromethamine    Review of Systems  Skin:  Positive for wound.  All other systems reviewed and are negative.   Updated Vital Signs BP (!) 162/84   Pulse 75   Temp (!) 97.3 F (36.3 C)   Resp 18   SpO2 100%   Physical Exam Vitals and nursing note reviewed.  Constitutional:      Appearance: He is well-developed.  HENT:     Head: Normocephalic and atraumatic.     Comments: Skin tear  occipital scalp, no active bleeding, no associated hematoma or skull depression Eyes:     Conjunctiva/sclera: Conjunctivae normal.     Pupils: Pupils are equal, round, and reactive to light.  Cardiovascular:     Rate and Rhythm: Normal rate and regular rhythm.     Heart sounds: Normal heart sounds.  Pulmonary:     Effort: Pulmonary effort is normal.     Breath sounds: Normal breath sounds.  Abdominal:     General: Bowel sounds are normal.     Palpations: Abdomen is soft.  Musculoskeletal:        General: Normal range of motion.     Cervical back: Normal range of motion.     Comments: Skin tears bilateral elbows, normal ROM without pain, no deformities present Pelvis is stable, no leg shortening or malrotation  Skin:    General: Skin is warm and dry.  Neurological:     Mental Status: He is alert.     Comments: Awake, alert, oriented to self and surroundings, able to answer questions and follow commands, moving arms/legs without focal deficit     (all labs ordered are listed, but only abnormal results are displayed) Labs Reviewed  CBC WITH DIFFERENTIAL/PLATELET - Abnormal; Notable for the following components:      Result Value   RBC 3.92 (*)    Hemoglobin 12.7 (*)    HCT 38.5 (*)    All other components within normal limits  COMPREHENSIVE METABOLIC PANEL WITH GFR - Abnormal; Notable for the following components:   Glucose, Bld 122 (*)    Total Protein 6.3 (*)    Albumin 3.0 (*)    GFR, Estimated 58 (*)    All other components within normal limits  URINALYSIS, W/ REFLEX TO CULTURE (INFECTION SUSPECTED) - Abnormal; Notable for the following components:   Color, Urine STRAW (*)    Glucose, UA >=500 (*)    Protein, ur 30 (*)    All other components within normal limits    EKG: None  Radiology: DG Pelvis Portable Result Date: 12/28/2023 CLINICAL DATA:  89 year old male status post unwitnessed fall on blood thinners. EXAM: PORTABLE PELVIS 1-2 VIEWS COMPARISON:  CT Abdomen  and Pelvis 11/19/2023, pelvis radiograph 11/19/2023. FINDINGS: Portable AP supine view at 0408 hours. A zipper artifact on the right. Femoral heads remain normally located. Bone mineralization is within normal limits for age. Bilateral SI joint ankylosis. Pelvis appears stable and intact. Grossly intact visible proximal femurs. Negative visible bowel gas. IMPRESSION: 1. No acute fracture or dislocation identified about the pelvis. 2. Chronic SI joint ankylosis. Electronically Signed   By: VEAR Hurst M.D.   On: 12/28/2023 05:13   DG Chest Port 1 View Result Date: 12/28/2023 CLINICAL DATA:  89 year old male status post  unwitnessed fall on blood thinners. EXAM: PORTABLE CHEST 1 VIEW COMPARISON:  CT Chest, Abdomen, and Pelvis 11/19/2023 and earlier. FINDINGS: Portable AP supine view at 0411 hours. Chronic sternotomy. Stable left chest cardiac loop recorder or superficial ICD. Stable low lung volumes. Stable cardiac size and mediastinal contours. Visualized tracheal air column is within normal limits. Allowing for portable technique the lungs are clear. No pneumothorax or pleural effusion. Stable visualized osseous structures. Negative visible bowel gas. IMPRESSION: Stable low lung volumes. No acute cardiopulmonary abnormality or acute traumatic injury identified. Electronically Signed   By: VEAR Hurst M.D.   On: 12/28/2023 05:11   CT Cervical Spine Wo Contrast Result Date: 12/28/2023 CLINICAL DATA:  89 year old male status post unwitnessed fall on blood thinners. EXAM: CT CERVICAL SPINE WITHOUT CONTRAST TECHNIQUE: Multidetector CT imaging of the cervical spine was performed without intravenous contrast. Multiplanar CT image reconstructions were also generated. RADIATION DOSE REDUCTION: This exam was performed according to the departmental dose-optimization program which includes automated exposure control, adjustment of the mA and/or kV according to patient size and/or use of iterative reconstruction technique.  COMPARISON:  Head CT today.  Cervical spine CT 11/19/2023. FINDINGS: Alignment: Stable cervical lordosis. Cervicothoracic junction alignment is within normal limits. Bilateral posterior element alignment is within normal limits. Skull base and vertebrae: Bone mineralization is within normal limits for age. Visualized skull base is intact. No atlanto-occipital dissociation. C1 and C2 appear intact and aligned. No acute osseous abnormality identified. Soft tissues and spinal canal: No prevertebral fluid or swelling. No visible canal hematoma. Retropharyngeal course of the carotids. Calcified carotid atherosclerosis. Otherwise negative visible neck soft tissues. Disc levels: Mostly age-appropriate cervical spine degeneration. But advanced disc degeneration at C6-C7 with vacuum disc. And advanced posterior element degeneration at C5-C6 with vacuum facet on the right and bulky ligament flavum hypertrophy. Upper chest: Negative visible noncontrast thoracic inlet. IMPRESSION: 1. No acute traumatic injury identified in the cervical spine. 2. Advanced chronic cervical spine degeneration at C5-C6 and C6-C7. Electronically Signed   By: VEAR Hurst M.D.   On: 12/28/2023 05:09   CT Head Wo Contrast Result Date: 12/28/2023 CLINICAL DATA:  89 year old male status post unwitnessed fall on blood thinners. EXAM: CT HEAD WITHOUT CONTRAST TECHNIQUE: Contiguous axial images were obtained from the base of the skull through the vertex without intravenous contrast. RADIATION DOSE REDUCTION: This exam was performed according to the departmental dose-optimization program which includes automated exposure control, adjustment of the mA and/or kV according to patient size and/or use of iterative reconstruction technique. COMPARISON:  Brain MRI 11/21/2023.  Head CT 11/19/2023. FINDINGS: Brain: Stable nonspecific ventriculomegaly. Confluent periventricular white matter hypodensity is stable, temporal horns are spared and no transependymal edema  suspected. Patchy chronic left cerebellar small infarcts. Stable gray-white matter differentiation throughout the brain. No midline shift, mass effect, evidence of mass lesion, intracranial hemorrhage or evidence of cortically based acute infarction. Vascular: No suspicious intracranial vascular hyperdensity. Calcified atherosclerosis at the skull base. Skull: Stable.  No acute osseous abnormality identified. Sinuses/Orbits: Pansinus chronic paranasal sinus disease with mucoperiosteal thickening, stable. Tympanic cavities and mastoids well aerated. Other: Posterior convexity scalp hematoma seen previously have regressed. Several skin staples remain in place along the posterior scalp convexity seen on series 4, images 50 and 57. New anterior right forehead scalp soft tissue swelling on series 4, image 64. Underlying calvarium intact. Orbits soft tissues appears stable. IMPRESSION: 1. New anterior right forehead scalp soft tissue swelling. No skull fracture. 2. No acute intracranial abnormality. Chronic small  vessel disease and stable nonspecific ventriculomegaly. 3. Chronic pansinusitis. Electronically Signed   By: VEAR Hurst M.D.   On: 12/28/2023 05:07     Procedures   Medications Ordered in the ED - No data to display                                  Medical Decision Making Amount and/or Complexity of Data Reviewed Labs: ordered. Radiology: ordered and independent interpretation performed. ECG/medicine tests: ordered and independent interpretation performed.   89 year old male presenting to the ED after a fall.  Seems like he was trying to get to the bathroom.  Did have an episode of incontinence when he fell.  Usually uses a walker.  [place] and struck his head against his wheelchair.  No loss of consciousness reported.  He is currently on aspirin  and Plavix .  He is awake, alert, oriented here.  Does have skin tears to the occipital scalp and bilateral elbows.  He does not have any acute deformities  on exam.  Pelvis is stable and nontender.  He seems to be at his baseline, answering questions and following commands appropriately.  No gross deficits.  Will obtain chest and pelvis films, CT head and cervical spine along with basic labs and UA.  Imaging all negative.  C-collar removed, ranging neck without difficulty.  Currently watching some videos on his cell phone and in no acute distress.  Labs are grossly reassuring.  UA without any signs of infection.  Wounds do not require any formal repair.  His tetanus is up-to-date.  Feel he is stable for discharge.  Can return here for new concerns.  Final diagnoses:  Fall, initial encounter    ED Discharge Orders     None          Jarold Olam HERO, PA-C 12/28/23 0540    Midge Golas, MD 12/28/23 [phone]

## 2023-12-28 NOTE — Discharge Instructions (Signed)
 Labs and imaging studies today without acute findings. Wound care to skin tears daily, can use topical neosporin or similar. Follow-up with primary care doctor. Return here for new concerns.

## 2023-12-28 NOTE — ED Notes (Signed)
 RN found patient's wallet on the floor in empty room [address]. RN attempted to call [name] to advised that wallet was found but no answer; Wallet taken to [employer], advertising made aware; RN will attempted to reach out to Family if available.-Monique,RN

## 2023-12-28 NOTE — Progress Notes (Signed)
 Orthopedic Tech Progress Note Patient Details:  [name] [birth year] [id number]  Patient ID: [name], male   DOB: 12/02/1934, 89 y.o.   MRN: [id number]  Level II trauma, no ortho tech orders at this time.  [name] 12/28/2023, 4:30 AM

## 2023-12-29 DIAGNOSIS — R296 Repeated falls: Secondary | ICD-10-CM | POA: Diagnosis not present

## 2023-12-29 DIAGNOSIS — R488 Other symbolic dysfunctions: Secondary | ICD-10-CM | POA: Diagnosis not present

## 2023-12-29 DIAGNOSIS — R4185 Anosognosia: Secondary | ICD-10-CM | POA: Diagnosis not present

## 2023-12-29 DIAGNOSIS — R41841 Cognitive communication deficit: Secondary | ICD-10-CM | POA: Diagnosis not present

## 2023-12-29 DIAGNOSIS — M62561 Muscle wasting and atrophy, not elsewhere classified, right lower leg: Secondary | ICD-10-CM | POA: Diagnosis not present

## 2023-12-29 DIAGNOSIS — R278 Other lack of coordination: Secondary | ICD-10-CM | POA: Diagnosis not present

## 2023-12-29 DIAGNOSIS — R4789 Other speech disturbances: Secondary | ICD-10-CM | POA: Diagnosis not present

## 2023-12-29 DIAGNOSIS — R2681 Unsteadiness on feet: Secondary | ICD-10-CM | POA: Diagnosis not present

## 2023-12-31 ENCOUNTER — Ambulatory Visit (INDEPENDENT_AMBULATORY_CARE_PROVIDER_SITE_OTHER)

## 2023-12-31 ENCOUNTER — Encounter: Payer: Self-pay | Admitting: Neurology

## 2023-12-31 DIAGNOSIS — R32 Unspecified urinary incontinence: Secondary | ICD-10-CM | POA: Diagnosis not present

## 2023-12-31 DIAGNOSIS — M47812 Spondylosis without myelopathy or radiculopathy, cervical region: Secondary | ICD-10-CM | POA: Diagnosis not present

## 2023-12-31 DIAGNOSIS — R269 Unspecified abnormalities of gait and mobility: Secondary | ICD-10-CM | POA: Diagnosis not present

## 2023-12-31 DIAGNOSIS — M4802 Spinal stenosis, cervical region: Secondary | ICD-10-CM | POA: Diagnosis not present

## 2023-12-31 DIAGNOSIS — M5021 Other cervical disc displacement,  high cervical region: Secondary | ICD-10-CM | POA: Diagnosis not present

## 2023-12-31 DIAGNOSIS — M50221 Other cervical disc displacement at C4-C5 level: Secondary | ICD-10-CM | POA: Diagnosis not present

## 2024-01-01 ENCOUNTER — Ambulatory Visit: Payer: HMO

## 2024-01-01 DIAGNOSIS — R296 Repeated falls: Secondary | ICD-10-CM | POA: Diagnosis not present

## 2024-01-01 DIAGNOSIS — M62561 Muscle wasting and atrophy, not elsewhere classified, right lower leg: Secondary | ICD-10-CM | POA: Diagnosis not present

## 2024-01-01 DIAGNOSIS — R2681 Unsteadiness on feet: Secondary | ICD-10-CM | POA: Diagnosis not present

## 2024-01-01 DIAGNOSIS — R41841 Cognitive communication deficit: Secondary | ICD-10-CM | POA: Diagnosis not present

## 2024-01-01 DIAGNOSIS — R488 Other symbolic dysfunctions: Secondary | ICD-10-CM | POA: Diagnosis not present

## 2024-01-01 DIAGNOSIS — R278 Other lack of coordination: Secondary | ICD-10-CM | POA: Diagnosis not present

## 2024-01-01 DIAGNOSIS — I10 Essential (primary) hypertension: Secondary | ICD-10-CM | POA: Diagnosis not present

## 2024-01-01 DIAGNOSIS — R4185 Anosognosia: Secondary | ICD-10-CM | POA: Diagnosis not present

## 2024-01-01 DIAGNOSIS — R4789 Other speech disturbances: Secondary | ICD-10-CM | POA: Diagnosis not present

## 2024-01-01 NOTE — Addendum Note (Signed)
 Addended by: VEAR CHARLIE LABOR on: 01/01/2024 05:58 PM   Modules accepted: Orders

## 2024-01-02 ENCOUNTER — Telehealth: Payer: Self-pay | Admitting: Neurology

## 2024-01-02 DIAGNOSIS — R296 Repeated falls: Secondary | ICD-10-CM | POA: Diagnosis not present

## 2024-01-02 DIAGNOSIS — R4789 Other speech disturbances: Secondary | ICD-10-CM | POA: Diagnosis not present

## 2024-01-02 DIAGNOSIS — R278 Other lack of coordination: Secondary | ICD-10-CM | POA: Diagnosis not present

## 2024-01-02 DIAGNOSIS — R4185 Anosognosia: Secondary | ICD-10-CM | POA: Diagnosis not present

## 2024-01-02 DIAGNOSIS — M62561 Muscle wasting and atrophy, not elsewhere classified, right lower leg: Secondary | ICD-10-CM | POA: Diagnosis not present

## 2024-01-02 DIAGNOSIS — R2681 Unsteadiness on feet: Secondary | ICD-10-CM | POA: Diagnosis not present

## 2024-01-02 DIAGNOSIS — R488 Other symbolic dysfunctions: Secondary | ICD-10-CM | POA: Diagnosis not present

## 2024-01-02 DIAGNOSIS — R41841 Cognitive communication deficit: Secondary | ICD-10-CM | POA: Diagnosis not present

## 2024-01-02 NOTE — Telephone Encounter (Signed)
 Referral for hematology send through EPIC to [facility]. Phone: [phone], Fax: [phone].

## 2024-01-02 NOTE — Telephone Encounter (Addendum)
 Called [name]/daughter (POA/on DPR) at [phone]. Relayed results per Dr. Duncan note. She verbalized understanding. States hematology already called and got him scheduled for 01/30/24.   They have not heard from [facility] [facility] to schedule LP yet. She will call this morning to get scheduled and will call if they have any difficulty with this.

## 2024-01-02 NOTE — Telephone Encounter (Signed)
-----   Message from [name] sent at 01/01/2024  5:58 PM EDT ----- Please let the daughter know:  The MRI of the cervical spine did not show any pressure on the spinal cord so we can rule that out as a contributing cause of the gait disturbance.   One of the blood test we did show that [name] is  making an abnormally high level of IgM, one of the antibodies.  Abnormal IgM proteins can cause a neuropathy which is probably causing some of the numbness.  I believe it is most likely that the  increased fluid in the brain is causing the shuffling gait but that the he also has a neuropathy causing the numbness.  Because of the abnormal blood test I would like him to also see hematology and  I have placed that order. ----- Message ----- From: Interface, Rad Results In Sent: 12/31/2023   6:59 PM EDT To: [name], MD

## 2024-01-03 DIAGNOSIS — R41841 Cognitive communication deficit: Secondary | ICD-10-CM | POA: Diagnosis not present

## 2024-01-03 DIAGNOSIS — R488 Other symbolic dysfunctions: Secondary | ICD-10-CM | POA: Diagnosis not present

## 2024-01-03 DIAGNOSIS — R278 Other lack of coordination: Secondary | ICD-10-CM | POA: Diagnosis not present

## 2024-01-03 DIAGNOSIS — R4789 Other speech disturbances: Secondary | ICD-10-CM | POA: Diagnosis not present

## 2024-01-03 DIAGNOSIS — R2681 Unsteadiness on feet: Secondary | ICD-10-CM | POA: Diagnosis not present

## 2024-01-03 DIAGNOSIS — M62561 Muscle wasting and atrophy, not elsewhere classified, right lower leg: Secondary | ICD-10-CM | POA: Diagnosis not present

## 2024-01-03 DIAGNOSIS — R296 Repeated falls: Secondary | ICD-10-CM | POA: Diagnosis not present

## 2024-01-03 DIAGNOSIS — R4185 Anosognosia: Secondary | ICD-10-CM | POA: Diagnosis not present

## 2024-01-03 LAB — CUP PACEART REMOTE DEVICE CHECK
Date Time Interrogation Session: 20250917140046
Implantable Pulse Generator Implant Date: 20230812
Pulse Gen Model: 436066
Pulse Gen Serial Number: 94081109

## 2024-01-04 DIAGNOSIS — R41841 Cognitive communication deficit: Secondary | ICD-10-CM | POA: Diagnosis not present

## 2024-01-04 DIAGNOSIS — M62561 Muscle wasting and atrophy, not elsewhere classified, right lower leg: Secondary | ICD-10-CM | POA: Diagnosis not present

## 2024-01-04 DIAGNOSIS — R4789 Other speech disturbances: Secondary | ICD-10-CM | POA: Diagnosis not present

## 2024-01-04 DIAGNOSIS — R2681 Unsteadiness on feet: Secondary | ICD-10-CM | POA: Diagnosis not present

## 2024-01-04 DIAGNOSIS — R278 Other lack of coordination: Secondary | ICD-10-CM | POA: Diagnosis not present

## 2024-01-04 DIAGNOSIS — R4185 Anosognosia: Secondary | ICD-10-CM | POA: Diagnosis not present

## 2024-01-04 DIAGNOSIS — R296 Repeated falls: Secondary | ICD-10-CM | POA: Diagnosis not present

## 2024-01-04 DIAGNOSIS — R488 Other symbolic dysfunctions: Secondary | ICD-10-CM | POA: Diagnosis not present

## 2024-01-04 NOTE — ED Notes (Signed)
 Spoke with the daughter and let her know we found the wallet.  She is going to come pick it up.

## 2024-01-05 DIAGNOSIS — R4185 Anosognosia: Secondary | ICD-10-CM | POA: Diagnosis not present

## 2024-01-05 DIAGNOSIS — R296 Repeated falls: Secondary | ICD-10-CM | POA: Diagnosis not present

## 2024-01-05 DIAGNOSIS — R4789 Other speech disturbances: Secondary | ICD-10-CM | POA: Diagnosis not present

## 2024-01-05 DIAGNOSIS — R2681 Unsteadiness on feet: Secondary | ICD-10-CM | POA: Diagnosis not present

## 2024-01-05 DIAGNOSIS — R488 Other symbolic dysfunctions: Secondary | ICD-10-CM | POA: Diagnosis not present

## 2024-01-05 DIAGNOSIS — M62561 Muscle wasting and atrophy, not elsewhere classified, right lower leg: Secondary | ICD-10-CM | POA: Diagnosis not present

## 2024-01-05 DIAGNOSIS — R278 Other lack of coordination: Secondary | ICD-10-CM | POA: Diagnosis not present

## 2024-01-05 DIAGNOSIS — R41841 Cognitive communication deficit: Secondary | ICD-10-CM | POA: Diagnosis not present

## 2024-01-07 ENCOUNTER — Ambulatory Visit: Payer: Self-pay | Admitting: Cardiology

## 2024-01-08 DIAGNOSIS — R278 Other lack of coordination: Secondary | ICD-10-CM | POA: Diagnosis not present

## 2024-01-08 DIAGNOSIS — R41841 Cognitive communication deficit: Secondary | ICD-10-CM | POA: Diagnosis not present

## 2024-01-08 DIAGNOSIS — R4185 Anosognosia: Secondary | ICD-10-CM | POA: Diagnosis not present

## 2024-01-08 DIAGNOSIS — R4789 Other speech disturbances: Secondary | ICD-10-CM | POA: Diagnosis not present

## 2024-01-08 DIAGNOSIS — R296 Repeated falls: Secondary | ICD-10-CM | POA: Diagnosis not present

## 2024-01-08 DIAGNOSIS — R488 Other symbolic dysfunctions: Secondary | ICD-10-CM | POA: Diagnosis not present

## 2024-01-08 DIAGNOSIS — M62561 Muscle wasting and atrophy, not elsewhere classified, right lower leg: Secondary | ICD-10-CM | POA: Diagnosis not present

## 2024-01-08 DIAGNOSIS — R2681 Unsteadiness on feet: Secondary | ICD-10-CM | POA: Diagnosis not present

## 2024-01-09 DIAGNOSIS — R296 Repeated falls: Secondary | ICD-10-CM | POA: Diagnosis not present

## 2024-01-09 DIAGNOSIS — R488 Other symbolic dysfunctions: Secondary | ICD-10-CM | POA: Diagnosis not present

## 2024-01-09 DIAGNOSIS — R4789 Other speech disturbances: Secondary | ICD-10-CM | POA: Diagnosis not present

## 2024-01-09 DIAGNOSIS — R2681 Unsteadiness on feet: Secondary | ICD-10-CM | POA: Diagnosis not present

## 2024-01-09 DIAGNOSIS — R4185 Anosognosia: Secondary | ICD-10-CM | POA: Diagnosis not present

## 2024-01-09 DIAGNOSIS — M62561 Muscle wasting and atrophy, not elsewhere classified, right lower leg: Secondary | ICD-10-CM | POA: Diagnosis not present

## 2024-01-09 DIAGNOSIS — R278 Other lack of coordination: Secondary | ICD-10-CM | POA: Diagnosis not present

## 2024-01-09 DIAGNOSIS — R41841 Cognitive communication deficit: Secondary | ICD-10-CM | POA: Diagnosis not present

## 2024-01-10 DIAGNOSIS — E1129 Type 2 diabetes mellitus with other diabetic kidney complication: Secondary | ICD-10-CM | POA: Diagnosis not present

## 2024-01-10 DIAGNOSIS — R488 Other symbolic dysfunctions: Secondary | ICD-10-CM | POA: Diagnosis not present

## 2024-01-10 DIAGNOSIS — R296 Repeated falls: Secondary | ICD-10-CM | POA: Diagnosis not present

## 2024-01-10 DIAGNOSIS — R4185 Anosognosia: Secondary | ICD-10-CM | POA: Diagnosis not present

## 2024-01-10 DIAGNOSIS — M62561 Muscle wasting and atrophy, not elsewhere classified, right lower leg: Secondary | ICD-10-CM | POA: Diagnosis not present

## 2024-01-10 DIAGNOSIS — R2681 Unsteadiness on feet: Secondary | ICD-10-CM | POA: Diagnosis not present

## 2024-01-10 DIAGNOSIS — R278 Other lack of coordination: Secondary | ICD-10-CM | POA: Diagnosis not present

## 2024-01-10 DIAGNOSIS — R41841 Cognitive communication deficit: Secondary | ICD-10-CM | POA: Diagnosis not present

## 2024-01-10 DIAGNOSIS — R4789 Other speech disturbances: Secondary | ICD-10-CM | POA: Diagnosis not present

## 2024-01-12 NOTE — Discharge Instructions (Signed)

## 2024-01-15 ENCOUNTER — Ambulatory Visit
Admission: RE | Admit: 2024-01-15 | Discharge: 2024-01-15 | Disposition: A | Source: Ambulatory Visit | Attending: Neurology | Admitting: Neurology

## 2024-01-15 VITALS — BP 148/76 | HR 69

## 2024-01-15 DIAGNOSIS — G629 Polyneuropathy, unspecified: Secondary | ICD-10-CM

## 2024-01-15 DIAGNOSIS — R269 Unspecified abnormalities of gait and mobility: Secondary | ICD-10-CM | POA: Diagnosis not present

## 2024-01-15 DIAGNOSIS — G9389 Other specified disorders of brain: Secondary | ICD-10-CM | POA: Diagnosis not present

## 2024-01-15 DIAGNOSIS — G912 (Idiopathic) normal pressure hydrocephalus: Secondary | ICD-10-CM

## 2024-01-15 NOTE — Progress Notes (Signed)
1 vial of blood drawn from pts Left AC to be sent off with LP lab work. 1 successful attempt, pt tolerated well. Gauze and tape applied after.

## 2024-01-17 ENCOUNTER — Telehealth: Payer: Self-pay | Admitting: Neurology

## 2024-01-17 ENCOUNTER — Ambulatory Visit: Payer: Self-pay | Admitting: Neurology

## 2024-01-17 NOTE — Telephone Encounter (Signed)
 See other note from today

## 2024-01-17 NOTE — Telephone Encounter (Signed)
 He had a lumbar puncture on 01/15/2024.  39 cc of CSF was removed.  Laboratory reports show elevated protein and glucose.  VDRL is pending.  Cell count was okay (only a couple white blood cells and elevated RBC likely related to the lumbar puncture)   I spoke to his daughter Hendricks.  The patient is now living with her brother.  She spoke with him yesterday.  At that time, she was uncertain if there had been significant improvement or not.  She will speak to her brother and the caregiver and let us  know tomorrow how he did over the 2 to 3 days after the lumbar puncture.  He has an NCV/EMG study scheduled soon.  He does have a paraprotein with elevated IgM (IEF showed monoclonal IgM lambda).  An appointment with hematology has been made for evaluation of MGUS.  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  An additional note will be made after the family reports back about extent of improvement after the lumbar puncture to determine whether he should be seen by neurosurgery

## 2024-01-20 LAB — CSF CELL COUNT WITH DIFFERENTIAL
RBC Count, CSF: 830 {cells}/uL — ABNORMAL HIGH
TOTAL NUCLEATED CELL: 3 {cells}/uL (ref 0–5)

## 2024-01-20 LAB — VDRL, CSF: VDRL Quant, CSF: NONREACTIVE

## 2024-01-20 LAB — PROTEIN, CSF: Total Protein, CSF: 136 mg/dL — ABNORMAL HIGH (ref 15–60)

## 2024-01-20 LAB — GLUCOSE, CSF: Glucose, CSF: 88 mg/dL — ABNORMAL HIGH (ref 40–80)

## 2024-01-20 LAB — OLIGOCLONAL BANDS, CSF + SERM: Oligo Bands: ABSENT

## 2024-01-21 ENCOUNTER — Encounter: Payer: Self-pay | Admitting: Neurology

## 2024-01-24 ENCOUNTER — Encounter: Payer: Self-pay | Admitting: Cardiology

## 2024-01-25 ENCOUNTER — Encounter: Admitting: Neurology

## 2024-01-29 ENCOUNTER — Other Ambulatory Visit: Payer: Self-pay

## 2024-01-29 DIAGNOSIS — D472 Monoclonal gammopathy: Secondary | ICD-10-CM

## 2024-01-30 ENCOUNTER — Inpatient Hospital Stay

## 2024-01-30 ENCOUNTER — Inpatient Hospital Stay: Admitting: Internal Medicine

## 2024-01-31 ENCOUNTER — Other Ambulatory Visit: Payer: Self-pay | Admitting: Neurology

## 2024-01-31 MED ORDER — HYDROXYZINE HCL 10 MG PO TABS: ORAL_TABLET | ORAL | 5 refills | Status: AC

## 2024-02-01 ENCOUNTER — Ambulatory Visit (INDEPENDENT_AMBULATORY_CARE_PROVIDER_SITE_OTHER): Payer: HMO

## 2024-02-01 DIAGNOSIS — I480 Paroxysmal atrial fibrillation: Secondary | ICD-10-CM | POA: Diagnosis not present

## 2024-02-01 LAB — CUP PACEART REMOTE DEVICE CHECK
Date Time Interrogation Session: 20251016100801
Implantable Pulse Generator Implant Date: 20230812
Pulse Gen Model: 436066
Pulse Gen Serial Number: 94081109

## 2024-02-07 NOTE — Progress Notes (Signed)
 Remote Loop Recorder Transmission

## 2024-02-09 DIAGNOSIS — E1129 Type 2 diabetes mellitus with other diabetic kidney complication: Secondary | ICD-10-CM | POA: Diagnosis not present

## 2024-02-10 ENCOUNTER — Ambulatory Visit: Payer: Self-pay | Admitting: Cardiology

## 2024-02-15 NOTE — Telephone Encounter (Signed)
 Received call from [facility]-stated pt's family cancelled appt with them and they did not wish to r/s at this time. Closing referral. FYI

## 2024-02-19 ENCOUNTER — Encounter: Payer: Self-pay | Admitting: Radiology

## 2024-03-04 ENCOUNTER — Ambulatory Visit (INDEPENDENT_AMBULATORY_CARE_PROVIDER_SITE_OTHER): Payer: HMO

## 2024-03-04 DIAGNOSIS — I480 Paroxysmal atrial fibrillation: Secondary | ICD-10-CM | POA: Diagnosis not present

## 2024-03-04 LAB — CUP PACEART REMOTE DEVICE CHECK
Date Time Interrogation Session: 20251117105453
Implantable Pulse Generator Implant Date: 20230812
Pulse Gen Model: 436066
Pulse Gen Serial Number: 94081109

## 2024-03-06 NOTE — Progress Notes (Signed)
 Remote Loop Recorder Transmission

## 2024-03-09 ENCOUNTER — Emergency Department (HOSPITAL_COMMUNITY)
Admission: EM | Admit: 2024-03-09 | Discharge: 2024-03-09 | Disposition: A | Discharge status: Home / Self Care | Attending: Emergency Medicine | Admitting: Emergency Medicine

## 2024-03-09 ENCOUNTER — Emergency Department (HOSPITAL_COMMUNITY)

## 2024-03-09 DIAGNOSIS — R109 Unspecified abdominal pain: Secondary | ICD-10-CM | POA: Diagnosis present

## 2024-03-09 DIAGNOSIS — F0390 Unspecified dementia without behavioral disturbance: Secondary | ICD-10-CM | POA: Diagnosis not present

## 2024-03-09 DIAGNOSIS — I10 Essential (primary) hypertension: Secondary | ICD-10-CM | POA: Diagnosis not present

## 2024-03-09 DIAGNOSIS — E119 Type 2 diabetes mellitus without complications: Secondary | ICD-10-CM | POA: Diagnosis not present

## 2024-03-09 DIAGNOSIS — I2510 Atherosclerotic heart disease of native coronary artery without angina pectoris: Secondary | ICD-10-CM | POA: Insufficient documentation

## 2024-03-09 DIAGNOSIS — K5900 Constipation, unspecified: Secondary | ICD-10-CM | POA: Insufficient documentation

## 2024-03-09 DIAGNOSIS — Z79899 Other long term (current) drug therapy: Secondary | ICD-10-CM | POA: Insufficient documentation

## 2024-03-09 LAB — CBC WITH DIFFERENTIAL/PLATELET
Abs Immature Granulocytes: 0.08 K/uL — ABNORMAL HIGH (ref 0.00–0.07)
Basophils Absolute: 0 K/uL (ref 0.0–0.1)
Basophils Relative: 0 %
Eosinophils Absolute: 0.1 K/uL (ref 0.0–0.5)
Eosinophils Relative: 0 %
HCT: 44.7 % (ref 39.0–52.0)
Hemoglobin: 15.1 g/dL (ref 13.0–17.0)
Immature Granulocytes: 1 %
Lymphocytes Relative: 9 %
Lymphs Abs: 1.2 K/uL (ref 0.7–4.0)
MCH: 32.3 pg (ref 26.0–34.0)
MCHC: 33.8 g/dL (ref 30.0–36.0)
MCV: 95.5 fL (ref 80.0–100.0)
Monocytes Absolute: 1 K/uL (ref 0.1–1.0)
Monocytes Relative: 7 %
Neutro Abs: 11 K/uL — ABNORMAL HIGH (ref 1.7–7.7)
Neutrophils Relative %: 83 %
Platelets: 237 K/uL (ref 150–400)
RBC: 4.68 MIL/uL (ref 4.22–5.81)
RDW: 13.8 % (ref 11.5–15.5)
WBC: 13.4 K/uL — ABNORMAL HIGH (ref 4.0–10.5)
nRBC: 0 % (ref 0.0–0.2)

## 2024-03-09 LAB — RESP PANEL BY RT-PCR (RSV, FLU A&B, COVID)  RVPGX2
Influenza A by PCR: NEGATIVE
Influenza B by PCR: NEGATIVE
Resp Syncytial Virus by PCR: NEGATIVE
SARS Coronavirus 2 by RT PCR: NEGATIVE

## 2024-03-09 LAB — COMPREHENSIVE METABOLIC PANEL WITH GFR
ALT: 31 U/L (ref 0–44)
AST: 35 U/L (ref 15–41)
Albumin: 3.4 g/dL — ABNORMAL LOW (ref 3.5–5.0)
Alkaline Phosphatase: 72 U/L (ref 38–126)
Anion gap: 12 (ref 5–15)
BUN: 22 mg/dL (ref 8–23)
CO2: 28 mmol/L (ref 22–32)
Calcium: 10.2 mg/dL (ref 8.9–10.3)
Chloride: 98 mmol/L (ref 98–111)
Creatinine, Ser: 1.44 mg/dL — ABNORMAL HIGH (ref 0.61–1.24)
GFR, Estimated: 47 mL/min — ABNORMAL LOW (ref >60)
Glucose, Bld: 248 mg/dL — ABNORMAL HIGH (ref 70–99)
Potassium: 4.5 mmol/L (ref 3.5–5.1)
Sodium: 138 mmol/L (ref 135–145)
Total Bilirubin: 1.2 mg/dL (ref 0.0–1.2)
Total Protein: 6.8 g/dL (ref 6.5–8.1)

## 2024-03-09 MED ORDER — SODIUM CHLORIDE 0.9 % IV BOLUS
500.0000 mL | Freq: Once | INTRAVENOUS | Status: AC
  Administered 2024-03-09: 500 mL via INTRAVENOUS

## 2024-03-09 MED ORDER — SENNOSIDES-DOCUSATE SODIUM 8.6-50 MG PO TABS: 1.0000 | ORAL_TABLET | Freq: Every evening | ORAL | 0 refills | Status: AC | PRN

## 2024-03-09 NOTE — ED Triage Notes (Signed)
 PT bib from home, failure to thrive, constipation, last BM was last Wednesday, and complaints of CP. No longer complaining of CP at arrival of EMS. Recently taken off all meds.    HR 110 116/68

## 2024-03-09 NOTE — ED Notes (Signed)
 Assisted with applying condom cath. and repositioning .[name]

## 2024-03-09 NOTE — ED Provider Notes (Signed)
 Emergency Department Provider Note   I have reviewed the triage vital signs and the nursing notes.   HISTORY  Chief Complaint Chest Pain   HPI [name] is a 89 y.o. male with PMH of DM, HLD, and HTN on Hospice presents to the ED for evaluation of constipation issues.  The patient is confused at baseline and does not know why he is here.  The patient's son arrives to bedside and states that he lives at home with 24-hour care and that earlier today was complaining of chest and abdominal pain.  He has done this in the past and it has been related to constipation.  They are unsure when his last bowel movement was.  No vomiting.  Patient denies any symptoms currently. Level 5 caveat: Dementia   Past Medical History:  Diagnosis Date   Acute renal failure    secondary to nephrotic syndrome post cath, resolved with steroids   Anxiety    Arthritis    CAD (coronary artery disease) 04/2006   LIMA to LAD, sequential SVG to first, second, and third OM's, sequential SVG to mid RCA and PDA    Depression    Diabetes mellitus without complication (HCC)    ED (erectile dysfunction)    HTN (hypertension)    Hx of adenomatous colonic polyps    Hyperlipidemia    Loop recorder: Biotronic Biomonitor II loop 07/28/2021 07/28/2021   Obesity    Stroke (HCC)    june    Review of Systems  Level 5 caveat: Dementia  ____________________________________________   PHYSICAL EXAM:  VITAL SIGNS: Vitals:   03/09/24 0600 03/09/24 0630  BP: (!) 119/90 (!) 141/81  Pulse: 95 94  Resp: 20 15  Temp:    SpO2: 97% 99%    Constitutional: Alert. Well appearing and in no acute distress. Eyes: Conjunctivae are normal.  Head: Atraumatic. Nose: No congestion/rhinnorhea. Mouth/Throat: Mucous membranes are moist. Neck: No stridor.   Cardiovascular: Normal rate, regular rhythm. Good peripheral circulation. Grossly normal heart sounds.   Respiratory: Normal respiratory effort.  No retractions.  Lungs CTAB. Gastrointestinal: Soft and nontender. No distention.  Musculoskeletal: No gross deformities of extremities. Neurologic:  Normal speech and language. No gross focal neurologic deficits are appreciated.  Skin:  Skin is warm, dry and intact. No rash noted.   ____________________________________________   LABS (all labs ordered are listed, but only abnormal results are displayed)  Labs Reviewed  COMPREHENSIVE METABOLIC PANEL WITH GFR - Abnormal; Notable for the following components:      Result Value   Glucose, Bld 248 (*)    Creatinine, Ser 1.44 (*)    Albumin 3.4 (*)    GFR, Estimated 47 (*)    All other components within normal limits  CBC WITH DIFFERENTIAL/PLATELET - Abnormal; Notable for the following components:   WBC 13.4 (*)    Neutro Abs 11.0 (*)    Abs Immature Granulocytes 0.08 (*)    All other components within normal limits  RESP PANEL BY RT-PCR (RSV, FLU A&B, COVID)  RVPGX2  URINALYSIS, W/ REFLEX TO CULTURE (INFECTION SUSPECTED)   ____________________________________________  EKG   EKG Interpretation Date/Time:  Saturday March 09 2024 02:09:10 EST Ventricular Rate:  102 PR Interval:  124 QRS Duration:  98 QT Interval:  437 QTC Calculation: 570 R Axis:   69  Text Interpretation: Sinus or ectopic atrial tachycardia Inferior infarct, old Prolonged QT interval Confirmed by [name] [phone]) on 03/09/2024 2:14:09 AM  ____________________________________________  RADIOLOGY  DG Abdomen Acute W/Chest Result Date: 03/09/2024 EXAM: UPRIGHT AND SUPINE XRAY VIEWS OF THE ABDOMEN AND 4 VIEW(S) OF THE CHEST 03/09/2024 02:37:00 AM COMPARISON: 12/28/2023 CLINICAL HISTORY: abdominal pain FINDINGS: LUNGS AND PLEURA: No consolidation or pulmonary edema. No pleural effusion or pneumothorax. HEART AND MEDIASTINUM: Loop recorder projects over the left chest, unchanged. Prior median sternotomy. BOWEL: The bowel gas pattern is nonspecific. No bowel  obstruction. PERITONEUM AND SOFT TISSUES: No abnormal calcifications. No free air. BONES: No acute osseous abnormality. IMPRESSION: 1. No acute cardiopulmonary or abdominal abnormality identified to explain abdominal pain. Electronically signed by: Franky Crease MD 03/09/2024 02:41 AM EST RP Workstation: [id number]    ____________________________________________   PROCEDURES  Procedure(s) performed:   Procedures  None  ____________________________________________   INITIAL IMPRESSION / ASSESSMENT AND PLAN / ED COURSE  Pertinent labs & imaging results that were available during my care of the patient were reviewed by me and considered in my medical decision making (see chart for details).   This patient is Presenting for Evaluation of CP, which does require a range of treatment options, and is a complaint that involves a high risk of morbidity and mortality.  The Differential Diagnoses includes but is not exclusive to acute coronary syndrome, aortic dissection, pulmonary embolism, cardiac tamponade, community-acquired pneumonia, pericarditis, musculoskeletal chest wall pain, etc.   Critical Interventions-    Medications  sodium chloride  0.9 % bolus 500 mL (0 mLs Intravenous Stopped 03/09/24 0454)    Reassessment after intervention: symptoms improved.    I did obtain Additional Historical Information from son at bedside, as the patient is confused at baseline.  Clinical Laboratory Tests Ordered, included CBC with mild leukocytosis. No AKI. LFTs normal.   Radiologic Tests Ordered, included DG abdomen/chest. I independently interpreted the images and agree with radiology interpretation.   Cardiac Monitor Tracing which shows NSR.    Social Determinants of Health Risk patient is a non-smoker.   Medical Decision Making: Summary:  The patient presents to the emergency department with abdominal pain earlier and question of some constipation.  No symptoms currently.  Abdomen is  diffusely soft and nontender.  EKG without acute ischemic change.  Patient is on hospice and has full care at home.  Will obtain screening x-ray and labs with plan for reassessment and likely optimization with discharge later this AM.  Reevaluation with update and discussion with patient and son. Labs and imaging reassuring. Stable for discharge. Constipation meds called in.    Patient's presentation is most consistent with acute presentation with potential threat to life or bodily function.   Disposition: discharge  ____________________________________________  FINAL CLINICAL IMPRESSION(S) / ED DIAGNOSES  Final diagnoses:  Abdominal pain, unspecified abdominal location  Constipation, unspecified constipation type     NEW OUTPATIENT MEDICATIONS STARTED DURING THIS VISIT:  New Prescriptions   SENNA-DOCUSATE (SENOKOT-S) 8.6-50 MG TABLET    Take 1 tablet by mouth at bedtime as needed for mild constipation.    Note:  This document was prepared using Dragon voice recognition software and may include unintentional dictation errors.  Fonda Law, MD, [facility] Emergency Medicine    Monte Bronder, Fonda MATSU, MD 03/09/24 [phone]

## 2024-03-09 NOTE — Discharge Instructions (Signed)
 Your blood work and x-ray today look normal.  You may have some mild constipation but signs of a blockage or severe constipation.  I have called in a prescription for constipation medication.  Please continue your management of symptoms at home and return with any new or suddenly worsening symptoms.

## 2024-03-09 NOTE — ED Notes (Signed)
 Called  [facility]   [phone]  [phone]

## 2024-03-09 NOTE — ED Notes (Signed)
 Recalled  [facility]

## 2024-03-10 ENCOUNTER — Emergency Department (HOSPITAL_COMMUNITY)

## 2024-03-10 ENCOUNTER — Emergency Department (HOSPITAL_COMMUNITY)
Admission: EM | Admit: 2024-03-10 | Discharge: 2024-03-10 | Disposition: A | Discharge status: Home / Self Care | Attending: Emergency Medicine | Admitting: Emergency Medicine

## 2024-03-10 DIAGNOSIS — Z7902 Long term (current) use of antithrombotics/antiplatelets: Secondary | ICD-10-CM | POA: Diagnosis not present

## 2024-03-10 DIAGNOSIS — R404 Transient alteration of awareness: Secondary | ICD-10-CM | POA: Diagnosis not present

## 2024-03-10 DIAGNOSIS — F0390 Unspecified dementia without behavioral disturbance: Secondary | ICD-10-CM | POA: Insufficient documentation

## 2024-03-10 DIAGNOSIS — K5900 Constipation, unspecified: Secondary | ICD-10-CM | POA: Diagnosis not present

## 2024-03-10 DIAGNOSIS — I10 Essential (primary) hypertension: Secondary | ICD-10-CM | POA: Diagnosis not present

## 2024-03-10 DIAGNOSIS — R100 Acute abdomen: Secondary | ICD-10-CM | POA: Diagnosis not present

## 2024-03-10 LAB — CBC WITH DIFFERENTIAL/PLATELET
Abs Immature Granulocytes: 0.06 K/uL (ref 0.00–0.07)
Basophils Absolute: 0 K/uL (ref 0.0–0.1)
Basophils Relative: 0 %
Eosinophils Absolute: 0 K/uL (ref 0.0–0.5)
Eosinophils Relative: 0 %
HCT: 47.1 % (ref 39.0–52.0)
Hemoglobin: 16.2 g/dL (ref 13.0–17.0)
Immature Granulocytes: 0 %
Lymphocytes Relative: 7 %
Lymphs Abs: 1.1 K/uL (ref 0.7–4.0)
MCH: 32.5 pg (ref 26.0–34.0)
MCHC: 34.4 g/dL (ref 30.0–36.0)
MCV: 94.6 fL (ref 80.0–100.0)
Monocytes Absolute: 0.9 K/uL (ref 0.1–1.0)
Monocytes Relative: 6 %
Neutro Abs: 13.9 K/uL — ABNORMAL HIGH (ref 1.7–7.7)
Neutrophils Relative %: 87 %
Platelets: 257 K/uL (ref 150–400)
RBC: 4.98 MIL/uL (ref 4.22–5.81)
RDW: 13.6 % (ref 11.5–15.5)
WBC: 15.9 K/uL — ABNORMAL HIGH (ref 4.0–10.5)
nRBC: 0 % (ref 0.0–0.2)

## 2024-03-10 LAB — COMPREHENSIVE METABOLIC PANEL WITH GFR
ALT: 29 U/L (ref 0–44)
AST: 39 U/L (ref 15–41)
Albumin: 4.1 g/dL (ref 3.5–5.0)
Alkaline Phosphatase: 105 U/L (ref 38–126)
Anion gap: 14 (ref 5–15)
BUN: 21 mg/dL (ref 8–23)
CO2: 26 mmol/L (ref 22–32)
Calcium: 10.8 mg/dL — ABNORMAL HIGH (ref 8.9–10.3)
Chloride: 96 mmol/L — ABNORMAL LOW (ref 98–111)
Creatinine, Ser: 1.23 mg/dL (ref 0.61–1.24)
GFR, Estimated: 57 mL/min — ABNORMAL LOW (ref >60)
Glucose, Bld: 307 mg/dL — ABNORMAL HIGH (ref 70–99)
Potassium: 4.4 mmol/L (ref 3.5–5.1)
Sodium: 137 mmol/L (ref 135–145)
Total Bilirubin: 1.1 mg/dL (ref 0.0–1.2)
Total Protein: 7.7 g/dL (ref 6.5–8.1)

## 2024-03-10 MED ORDER — SODIUM CHLORIDE 0.9 % IV BOLUS
500.0000 mL | Freq: Once | INTRAVENOUS | Status: AC
Start: 2024-03-10 — End: 2024-03-10
  Administered 2024-03-10: 500 mL via INTRAVENOUS

## 2024-03-10 MED ORDER — HYDROMORPHONE HCL 1 MG/ML IJ SOLN
0.5000 mg | Freq: Once | INTRAMUSCULAR | Status: AC
  Administered 2024-03-10: 0.5 mg via INTRAVENOUS
  Filled 2024-03-10: qty 1

## 2024-03-10 MED ORDER — ONDANSETRON HCL 4 MG/2ML IJ SOLN
4.0000 mg | Freq: Once | INTRAMUSCULAR | Status: AC
  Administered 2024-03-10: 4 mg via INTRAVENOUS
  Filled 2024-03-10: qty 2

## 2024-03-10 MED ORDER — GLYCERIN (ADULT) 2 G RE SUPP: 1.0000 | RECTAL | 0 refills | Status: AC | PRN

## 2024-03-10 MED ORDER — MAGNESIUM CITRATE PO SOLN: 1.0000 | Freq: Once | ORAL | 0 refills | Status: AC

## 2024-03-10 MED ORDER — GLYCERIN (LAXATIVE) 2.1 G RE SUPP
1.0000 | Freq: Once | RECTAL | Status: DC
  Filled 2024-03-10: qty 1

## 2024-03-10 MED ORDER — POLYETHYLENE GLYCOL 3350 17 GM/SCOOP PO POWD: 17.0000 g | Freq: Every day | ORAL | 0 refills | Status: AC

## 2024-03-10 NOTE — ED Provider Notes (Signed)
 [place] EMERGENCY DEPARTMENT AT [facility] Provider Note   CSN: [id number] Arrival date & time: 03/10/24  1050     Patient presents with: Fecal Impaction   [name] is a 89 y.o. male.   HPI   89 year old male presents emergency department with daughter and caregiver for concern of constipation.  Was seen yesterday at [facility], had a reassuring workup and was discharged home with symptomatic treatment.  Patient was not successful at home and did not have a bowel movement.  Reportedly the home aide attempted disimpaction but was unsuccessful.  Now coming in with episodes of grimacing, feeling like he has to have a bowel movement.  Otherwise he is confused with dementia.  No other new/acute symptoms.  Prior to Admission medications   Medication Sig Start Date End Date Taking? Authorizing Provider  hydrOXYzine  (ATARAX ) 10 MG tablet 1 pill p.o. q. evening.  May take a second pill as needed for agitation 01/31/24   Sater, Charlie LABOR, MD  acebutolol  (SECTRAL ) 200 MG capsule Take 1 capsule (200 mg total) by mouth 2 (two) times daily. Patient not taking: Reported on 12/20/2023 02/05/23   Ladona Heinz, MD  atorvastatin  (LIPITOR ) 40 MG tablet Take 40 mg by mouth every evening. Patient not taking: No sig reported    [provider]  buPROPion  (WELLBUTRIN  XL) 150 MG 24 hr tablet Take 150 mg by mouth every morning.    [provider]  clopidogrel  (PLAVIX ) 75 MG tablet Take 1 tablet (75 mg total) by mouth daily. 11/22/23 11/21/24  Raenelle Donalda HERO, MD  cyanocobalamin  (VITAMIN B12) 1000 MCG tablet Take 1 tablet (1,000 mcg total) by mouth daily. Patient not taking: Reported on 12/20/2023 11/22/23   Raenelle Donalda HERO, MD  empagliflozin  (JARDIANCE ) 25 MG TABS tablet Take 1 tablet (25 mg total) by mouth daily. Patient not taking: Reported on 12/20/2023 10/12/22   Pegge Toribio PARAS, PA-C  escitalopram  (LEXAPRO ) 10 MG tablet Take 10 mg by mouth daily. 11/21/22   [provider]  ezetimibe  (ZETIA ) 10 MG tablet Take 10 mg by mouth daily. 11/21/22   [provider]  furosemide  (LASIX ) 40 MG tablet Take 1 tablet (40 mg total) by mouth daily as needed for edema. Patient not taking: Reported on 12/20/2023 12/28/22   Ladona Heinz, MD  insulin  degludec (TRESIBA FLEXTOUCH) 200 UNIT/ML FlexTouch Pen Inject 20 Units into the skin as needed (if BS is above 200).    [provider]  metFORMIN  (GLUCOPHAGE ) 1000 MG tablet Take 1,000 mg by mouth 2 (two) times daily with a meal. Patient not taking: Reported on 12/20/2023    [provider]  mirabegron  ER (MYRBETRIQ ) 50 MG TB24 tablet Take 1 tablet (50 mg total) by mouth daily. 10/12/22   Angiulli, Toribio PARAS, PA-C  olmesartan  (BENICAR ) 20 MG tablet Take 1 tablet (20 mg total) by mouth daily. 10/12/22   Angiulli, Toribio PARAS, PA-C  pantoprazole  (PROTONIX ) 40 MG tablet Take 1 tablet (40 mg total) by mouth daily. Patient not taking: Reported on 12/20/2023 11/22/23   Raenelle Donalda HERO, MD  polyethylene glycol powder (GLYCOLAX /MIRALAX ) 17 GM/SCOOP powder Take 17 g by mouth daily. 11/22/23   Ghimire, Donalda HERO, MD  senna (SENOKOT) 8.6 MG TABS tablet Take 1 tablet (8.6 mg total) by mouth at bedtime. 11/22/23   Ghimire, Donalda HERO, MD  senna-docusate (SENOKOT-S) 8.6-50 MG tablet Take 1 tablet by mouth at bedtime as needed for mild constipation. 03/09/24   Long, Fonda MATSU, MD  tadalafil  (CIALIS ) 5 MG tablet Take 1 tablet (5 mg total) by mouth daily. 10/12/22   Angiulli, Toribio PARAS, PA-C  tamsulosin  (FLOMAX ) 0.4 MG CAPS capsule Take 1 capsule (0.4 mg total) by mouth daily after supper. Patient taking differently: Take 0.4 mg by mouth at bedtime. 10/12/22   Angiulli, Toribio PARAS, PA-C    Allergies: Ketorolac tromethamine    Review of Systems  Unable to perform ROS: Dementia    Updated Vital Signs BP (!) 183/141   Pulse (!) 108   Temp 97.7 F (36.5 C) (Axillary)   Resp (!) 23   SpO2 100%   Physical Exam Vitals and nursing  note reviewed.  Constitutional:      General: He is not in acute distress.    Appearance: Normal appearance.  HENT:     Head: Normocephalic.     Mouth/Throat:     Mouth: Mucous membranes are moist.  Cardiovascular:     Rate and Rhythm: Normal rate.  Pulmonary:     Effort: Pulmonary effort is normal. No respiratory distress.  Abdominal:     Palpations: Abdomen is soft.     Comments: Slightly protuberant, diffusely tender  Skin:    General: Skin is warm.  Neurological:     Mental Status: He is alert and oriented to person, place, and time. Mental status is at baseline.  Psychiatric:        Mood and Affect: Mood normal.     (all labs ordered are listed, but only abnormal results are displayed) Labs Reviewed  CBC WITH DIFFERENTIAL/PLATELET - Abnormal; Notable for the following components:      Result Value   WBC 15.9 (*)    Neutro Abs 13.9 (*)    All other components within normal limits  COMPREHENSIVE METABOLIC PANEL WITH GFR - Abnormal; Notable for the following components:   Chloride 96 (*)    Glucose, Bld 307 (*)    Calcium  10.8 (*)    GFR, Estimated 57 (*)    All other components within normal limits    EKG: EKG Interpretation Date/Time:  Sunday March 10 2024 11:06:54 EST Ventricular Rate:  113 PR Interval:  96 QRS Duration:  93 QT Interval:  405 QTC Calculation: 556 R Axis:   73  Text Interpretation: Sinus tachycardia Inferior infarct, old Prolonged QT interval similar to previous Confirmed by Bari Flank [phone]) on 03/10/2024 11:32:32 AM  Radiology: ARCOLA Abd Portable 2 Views Result Date: 03/10/2024 CLINICAL DATA:  Constipation. EXAM: PORTABLE ABDOMEN - 2 VIEW COMPARISON:  Abdominal radiograph dated 03/09/2026. FINDINGS: No bowel dilatation or evidence of obstruction. No free air. No acute osseous pathology. Degenerative changes spine. IMPRESSION: Nonobstructive bowel gas pattern. Electronically Signed   By: Vanetta Chou M.D.   On: 03/10/2024 12:27    DG Abdomen Acute W/Chest Result Date: 03/09/2024 EXAM: UPRIGHT AND SUPINE XRAY VIEWS OF THE ABDOMEN AND 4 VIEW(S) OF THE CHEST 03/09/2024 02:37:00 AM COMPARISON: 12/28/2023 CLINICAL HISTORY: abdominal pain FINDINGS: LUNGS AND PLEURA: No consolidation or pulmonary edema. No pleural effusion or pneumothorax. HEART AND MEDIASTINUM: Loop recorder projects over the left chest, unchanged. Prior median sternotomy. BOWEL: The bowel gas pattern is nonspecific. No bowel obstruction. PERITONEUM AND SOFT TISSUES: No abnormal calcifications. No free air. BONES: No acute osseous abnormality. IMPRESSION: 1. No acute cardiopulmonary or abdominal abnormality identified to explain abdominal pain. Electronically signed by: Franky Crease MD 03/09/2024 02:41 AM EST RP Workstation: [id number]     .Fecal disimpaction  Date/Time: 03/10/2024 1:50 PM  Performed  by: Bari Roxie HERO, DO Authorized by: Bari Roxie HERO, DO  Consent: Verbal consent obtained Consent given by: patient and guardian Patient understanding: patient states understanding of the procedure being performed Imaging studies: imaging studies available Patient identity confirmed: verbally with patient Preparation: Patient was prepped and draped in the usual sterile fashion. Local anesthesia used: no  Anesthesia: Local anesthesia used: no  Sedation: Patient sedated: no  Patient tolerance: patient tolerated the procedure well with no immediate complications Comments: Moderate amount of stool removed      Medications Ordered in the ED  sodium chloride  0.9 % bolus 500 mL (500 mLs Intravenous New Bag/Given 03/10/24 1249)  HYDROmorphone  (DILAUDID ) injection 0.5 mg (0.5 mg Intravenous Given 03/10/24 1248)  ondansetron  (ZOFRAN ) injection 4 mg (4 mg Intravenous Given 03/10/24 1249)                                    Medical Decision Making Amount and/or Complexity of Data Reviewed Labs: ordered. Radiology: ordered.  Risk Prescription  drug management.   89 year old male presents emergency department accompanied by caregivers with concern for constipation.  Last bowel movement was 2 days ago.  He reportedly goes every day.  He is complaining of mild abdominal pain and the urge to have a bowel movement but is not successful.  He is still passing gas.  Patient was tachycardic in triage, EKG is sinus tachycardia, slightly hypertensive.  Abdomen is mildly protuberant, tender but soft.  X-ray of the abdomen shows no findings of obstruction.  Blood work is stable, creatinine is improved from yesterday.  Patient was given IV medicine.  Performed fecal disimpaction.  Patient did not fully tolerate 2/2 pain even with IV medicine but moderate amount of stool was removed.  Will plan for enema.  Small amount of stool and fluid relieved with enema.  No findings of obstruction at this time.  Have educated the daughter and caregiver about constipation treatment.  We will place a glycerin  suppository here and I have encouraged MiraLAX  cleanout along with glycerin  suppositories of the next couple days.  Have also sent a prescription for magnesium  citrate if this is not successful.  Educated them on symptoms of obstruction.  Patient at this time appears safe and stable for discharge and close outpatient follow up. Discharge plan and strict return to ED precautions discussed, patient verbalizes understanding and agreement.     Final diagnoses:  None    ED Discharge Orders     None          Bari Roxie HERO, DO 03/10/24 1541

## 2024-03-10 NOTE — ED Triage Notes (Addendum)
 Pt BIBA from home for constipation,  Pt seen at [facility] yesterday for same.  Unable to relieve constipation at home due to pain with disimpaction attempt by home health.  Per family at bedside, pt had bm on Friday.

## 2024-03-10 NOTE — Discharge Instructions (Signed)
 You have been seen and discharged from the emergency department.  Continue to use glycerin  suppositories for the next couple days.  You may do a capful of MiraLAX  in your Ensure or any drink for continued treatment.  I recommend starting with 3 times a day and you may titrate this down. If you are not successful in having a bowel movement by tomorrow evening you may drink magnesium  citrate as prescribed.  I recommend 8 ounces of magnesium  citrate every 4-5 hours until desired relief.  Follow-up with your primary provider for further evaluation and further care. Take home medications as prescribed. If you have any worsening symptoms or further concerns for your health please return to an emergency department for further evaluation.

## 2024-03-13 ENCOUNTER — Telehealth: Payer: Self-pay | Admitting: Neurology

## 2024-03-13 NOTE — Telephone Encounter (Signed)
 Pt daughter called to cancel appt due to Pt is  in Hospice   Appt Canceled

## 2024-03-18 DEATH — deceased

## 2024-03-20 ENCOUNTER — Encounter: Admitting: Neurology

## 2024-04-05 ENCOUNTER — Ambulatory Visit: Payer: Self-pay | Admitting: Cardiology
